# Patient Record
Sex: Male | Born: 1940 | Race: White | Hispanic: No | Marital: Married | Smoking: Former smoker
Health system: Southern US, Community
[De-identification: ages and names within clinical notes are randomized; demographics above are authoritative.]

## PROBLEM LIST (undated history)

## (undated) DIAGNOSIS — R059 Cough, unspecified: Secondary | ICD-10-CM

## (undated) DIAGNOSIS — J189 Pneumonia, unspecified organism: Secondary | ICD-10-CM

## (undated) DIAGNOSIS — F329 Major depressive disorder, single episode, unspecified: Secondary | ICD-10-CM

## (undated) DIAGNOSIS — J449 Chronic obstructive pulmonary disease, unspecified: Secondary | ICD-10-CM

## (undated) DIAGNOSIS — I10 Essential (primary) hypertension: Secondary | ICD-10-CM

## (undated) DIAGNOSIS — I739 Peripheral vascular disease, unspecified: Secondary | ICD-10-CM

## (undated) DIAGNOSIS — F32A Depression, unspecified: Secondary | ICD-10-CM

## (undated) DIAGNOSIS — R0600 Dyspnea, unspecified: Secondary | ICD-10-CM

## (undated) DIAGNOSIS — M1990 Unspecified osteoarthritis, unspecified site: Secondary | ICD-10-CM

## (undated) DIAGNOSIS — F419 Anxiety disorder, unspecified: Secondary | ICD-10-CM

## (undated) DIAGNOSIS — K254 Chronic or unspecified gastric ulcer with hemorrhage: Secondary | ICD-10-CM

## (undated) DIAGNOSIS — R05 Cough: Secondary | ICD-10-CM

## (undated) HISTORY — DX: Peripheral vascular disease, unspecified: I73.9

## (undated) HISTORY — DX: Chronic obstructive pulmonary disease, unspecified: J44.9

## (undated) HISTORY — DX: Unspecified osteoarthritis, unspecified site: M19.90

## (undated) HISTORY — PX: JOINT REPLACEMENT: SHX530

## (undated) HISTORY — PX: KNEE ARTHROSCOPY: SUR90

---
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
## 2005-10-25 ENCOUNTER — Encounter: Admission: RE | Admit: 2005-10-25 | Discharge: 2005-10-25 | Payer: Self-pay | Admitting: Orthopedic Surgery

## 2007-02-08 HISTORY — PX: TOTAL HIP ARTHROPLASTY: SHX124

## 2009-02-21 ENCOUNTER — Emergency Department (HOSPITAL_COMMUNITY): Admission: EM | Admit: 2009-02-21 | Discharge: 2009-02-21 | Payer: Self-pay | Admitting: Emergency Medicine

## 2012-02-08 DIAGNOSIS — I739 Peripheral vascular disease, unspecified: Secondary | ICD-10-CM

## 2012-02-08 HISTORY — DX: Peripheral vascular disease, unspecified: I73.9

## 2012-12-03 DIAGNOSIS — M2240 Chondromalacia patellae, unspecified knee: Secondary | ICD-10-CM | POA: Insufficient documentation

## 2012-12-03 DIAGNOSIS — M1710 Unilateral primary osteoarthritis, unspecified knee: Secondary | ICD-10-CM | POA: Insufficient documentation

## 2012-12-03 DIAGNOSIS — Z96649 Presence of unspecified artificial hip joint: Secondary | ICD-10-CM | POA: Insufficient documentation

## 2012-12-03 DIAGNOSIS — S83249A Other tear of medial meniscus, current injury, unspecified knee, initial encounter: Secondary | ICD-10-CM | POA: Insufficient documentation

## 2014-04-07 DIAGNOSIS — J069 Acute upper respiratory infection, unspecified: Secondary | ICD-10-CM | POA: Diagnosis not present

## 2014-04-07 DIAGNOSIS — R062 Wheezing: Secondary | ICD-10-CM | POA: Diagnosis not present

## 2014-10-31 DIAGNOSIS — J449 Chronic obstructive pulmonary disease, unspecified: Secondary | ICD-10-CM | POA: Diagnosis not present

## 2014-10-31 DIAGNOSIS — F17200 Nicotine dependence, unspecified, uncomplicated: Secondary | ICD-10-CM | POA: Diagnosis not present

## 2015-02-20 DIAGNOSIS — Z6824 Body mass index (BMI) 24.0-24.9, adult: Secondary | ICD-10-CM | POA: Diagnosis not present

## 2015-02-20 DIAGNOSIS — R509 Fever, unspecified: Secondary | ICD-10-CM | POA: Diagnosis not present

## 2015-02-20 DIAGNOSIS — F330 Major depressive disorder, recurrent, mild: Secondary | ICD-10-CM | POA: Diagnosis not present

## 2015-02-20 DIAGNOSIS — Z125 Encounter for screening for malignant neoplasm of prostate: Secondary | ICD-10-CM | POA: Diagnosis not present

## 2015-02-20 DIAGNOSIS — E785 Hyperlipidemia, unspecified: Secondary | ICD-10-CM | POA: Diagnosis not present

## 2015-03-10 DIAGNOSIS — R509 Fever, unspecified: Secondary | ICD-10-CM | POA: Diagnosis not present

## 2015-04-15 ENCOUNTER — Ambulatory Visit (INDEPENDENT_AMBULATORY_CARE_PROVIDER_SITE_OTHER): Payer: Commercial Managed Care - HMO | Admitting: Infectious Diseases

## 2015-04-15 ENCOUNTER — Encounter: Payer: Self-pay | Admitting: Infectious Diseases

## 2015-04-15 VITALS — BP 156/70 | HR 71 | Temp 97.7°F | Ht 70.0 in | Wt 174.0 lb

## 2015-04-15 DIAGNOSIS — R509 Fever, unspecified: Secondary | ICD-10-CM | POA: Diagnosis not present

## 2015-04-15 NOTE — Progress Notes (Signed)
Subjective:    Patient ID: William Kirk, male    DOB: July 14, 1940, 75 y.o.   MRN: [id number]  HPI 75 yo M with FUO since September, up to 101-102. Has not had for last month.  Lasts for ~ 1 day. No cough, mild sob. Has lost 5-6#. No LAN.  Was seen by PCP and had blood work done. Had CXR that was (-) per pt. Was seen at Urgent Care ([facility] once- was rec claritin, mucinex- CXR nl 10-2014).  He had normal CBC, BMP and PSA at [facility] 09-2014.   The past medical history, family history and social history were reviewed/updated in EPIC  TB risk- no known exposure, was in [employer] [id number] (on [employer]). No incarcerations, homelessness.  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   Review of Systems  Constitutional: Positive for fever, fatigue and unexpected weight change. Negative for chills and appetite change.  Respiratory: Positive for shortness of breath. Negative for cough.   Cardiovascular: Negative for leg swelling.  Gastrointestinal: Negative for diarrhea and constipation.  Genitourinary: Negative for difficulty urinating.  Musculoskeletal: Positive for myalgias.       Objective:   Physical Exam  Constitutional: He appears well-developed and well-nourished.  HENT:  Mouth/Throat: No oropharyngeal exudate.  Eyes: EOM are normal. Pupils are equal, round, and reactive to light.  Neck: Neck supple.  Cardiovascular: Normal rate, regular rhythm and normal heart sounds.   Pulmonary/Chest: Effort normal and breath sounds normal.  Abdominal: Soft. Bowel sounds are normal. There is no tenderness. There is no rebound.  Musculoskeletal: He exhibits no edema.  Normal pulse LLE- DP- 2+. notpalpable on R but normal warmth.  No skin ulcers.   Lymphadenopathy:    He has no cervical adenopathy.      Assessment & Plan:

## 2015-04-15 NOTE — Assessment & Plan Note (Addendum)
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  He has no clear si/sx to suggest a source.  He has some TB risk factors but a negative CXR prior, minimal if any cough. None in office.  He does not complain of joint aches.  He has a hx of RLE ischemia, but none clearly present on exam.  Will screen him broadly, he will get his labs done at [place], bring them to his f/u in 1 month If blood work negative, pan-CT to screen for malignancy Also keep fever log.

## 2015-05-13 ENCOUNTER — Ambulatory Visit: Payer: Commercial Managed Care - HMO | Admitting: Infectious Diseases

## 2015-05-30 DIAGNOSIS — R0902 Hypoxemia: Secondary | ICD-10-CM | POA: Diagnosis not present

## 2015-05-30 DIAGNOSIS — J449 Chronic obstructive pulmonary disease, unspecified: Secondary | ICD-10-CM | POA: Diagnosis not present

## 2015-05-30 DIAGNOSIS — R05 Cough: Secondary | ICD-10-CM | POA: Diagnosis not present

## 2015-05-30 DIAGNOSIS — R509 Fever, unspecified: Secondary | ICD-10-CM | POA: Diagnosis not present

## 2015-09-03 DIAGNOSIS — H5210 Myopia, unspecified eye: Secondary | ICD-10-CM | POA: Diagnosis not present

## 2015-09-03 DIAGNOSIS — H524 Presbyopia: Secondary | ICD-10-CM | POA: Diagnosis not present

## 2016-06-20 ENCOUNTER — Emergency Department (HOSPITAL_BASED_OUTPATIENT_CLINIC_OR_DEPARTMENT_OTHER): Payer: Non-veteran care

## 2016-06-20 ENCOUNTER — Encounter (HOSPITAL_BASED_OUTPATIENT_CLINIC_OR_DEPARTMENT_OTHER): Payer: Self-pay

## 2016-06-20 ENCOUNTER — Inpatient Hospital Stay (HOSPITAL_BASED_OUTPATIENT_CLINIC_OR_DEPARTMENT_OTHER)
Admission: EM | Admit: 2016-06-20 | Discharge: 2016-06-23 | DRG: 871 | Disposition: A | Discharge status: Home / Self Care | Payer: Non-veteran care | Attending: Internal Medicine | Admitting: Internal Medicine

## 2016-06-20 DIAGNOSIS — E876 Hypokalemia: Secondary | ICD-10-CM

## 2016-06-20 DIAGNOSIS — J449 Chronic obstructive pulmonary disease, unspecified: Secondary | ICD-10-CM | POA: Diagnosis not present

## 2016-06-20 DIAGNOSIS — J470 Bronchiectasis with acute lower respiratory infection: Secondary | ICD-10-CM | POA: Diagnosis present

## 2016-06-20 DIAGNOSIS — E440 Moderate protein-calorie malnutrition: Secondary | ICD-10-CM | POA: Diagnosis present

## 2016-06-20 DIAGNOSIS — J471 Bronchiectasis with (acute) exacerbation: Secondary | ICD-10-CM | POA: Diagnosis not present

## 2016-06-20 DIAGNOSIS — Z7982 Long term (current) use of aspirin: Secondary | ICD-10-CM | POA: Diagnosis not present

## 2016-06-20 DIAGNOSIS — Z79891 Long term (current) use of opiate analgesic: Secondary | ICD-10-CM | POA: Diagnosis not present

## 2016-06-20 DIAGNOSIS — E871 Hypo-osmolality and hyponatremia: Secondary | ICD-10-CM | POA: Diagnosis present

## 2016-06-20 DIAGNOSIS — E43 Unspecified severe protein-calorie malnutrition: Secondary | ICD-10-CM | POA: Insufficient documentation

## 2016-06-20 DIAGNOSIS — J479 Bronchiectasis, uncomplicated: Secondary | ICD-10-CM

## 2016-06-20 DIAGNOSIS — A419 Sepsis, unspecified organism: Secondary | ICD-10-CM | POA: Diagnosis not present

## 2016-06-20 DIAGNOSIS — M1990 Unspecified osteoarthritis, unspecified site: Secondary | ICD-10-CM | POA: Diagnosis present

## 2016-06-20 DIAGNOSIS — J440 Chronic obstructive pulmonary disease with acute lower respiratory infection: Secondary | ICD-10-CM | POA: Diagnosis present

## 2016-06-20 DIAGNOSIS — Z79899 Other long term (current) drug therapy: Secondary | ICD-10-CM

## 2016-06-20 DIAGNOSIS — Z87891 Personal history of nicotine dependence: Secondary | ICD-10-CM

## 2016-06-20 DIAGNOSIS — Z6822 Body mass index (BMI) 22.0-22.9, adult: Secondary | ICD-10-CM | POA: Diagnosis not present

## 2016-06-20 DIAGNOSIS — I739 Peripheral vascular disease, unspecified: Secondary | ICD-10-CM | POA: Diagnosis not present

## 2016-06-20 DIAGNOSIS — J9601 Acute respiratory failure with hypoxia: Secondary | ICD-10-CM | POA: Diagnosis present

## 2016-06-20 DIAGNOSIS — D75839 Thrombocytosis, unspecified: Secondary | ICD-10-CM

## 2016-06-20 DIAGNOSIS — J189 Pneumonia, unspecified organism: Secondary | ICD-10-CM | POA: Diagnosis present

## 2016-06-20 DIAGNOSIS — J180 Bronchopneumonia, unspecified organism: Secondary | ICD-10-CM | POA: Diagnosis not present

## 2016-06-20 DIAGNOSIS — D473 Essential (hemorrhagic) thrombocythemia: Secondary | ICD-10-CM | POA: Diagnosis present

## 2016-06-20 DIAGNOSIS — J90 Pleural effusion, not elsewhere classified: Secondary | ICD-10-CM | POA: Diagnosis not present

## 2016-06-20 DIAGNOSIS — J441 Chronic obstructive pulmonary disease with (acute) exacerbation: Secondary | ICD-10-CM | POA: Diagnosis present

## 2016-06-20 LAB — CBC WITH DIFFERENTIAL/PLATELET
Basophils Absolute: 0 10*3/uL (ref 0.0–0.1)
Basophils Relative: 0 %
Eosinophils Absolute: 0.1 10*3/uL (ref 0.0–0.7)
Eosinophils Relative: 1 %
HCT: 30.6 % — ABNORMAL LOW (ref 39.0–52.0)
Hemoglobin: 10.2 g/dL — ABNORMAL LOW (ref 13.0–17.0)
Lymphocytes Relative: 9 %
Lymphs Abs: 1.8 10*3/uL (ref 0.7–4.0)
MCH: 27.6 pg (ref 26.0–34.0)
MCHC: 33.3 g/dL (ref 30.0–36.0)
MCV: 82.7 fL (ref 78.0–100.0)
Monocytes Absolute: 1.1 10*3/uL — ABNORMAL HIGH (ref 0.1–1.0)
Monocytes Relative: 6 %
Neutro Abs: 16.9 10*3/uL — ABNORMAL HIGH (ref 1.7–7.7)
Neutrophils Relative %: 85 %
Platelets: 535 10*3/uL — ABNORMAL HIGH (ref 150–400)
RBC: 3.7 MIL/uL — ABNORMAL LOW (ref 4.22–5.81)
RDW: 14.6 % (ref 11.5–15.5)
WBC: 19.9 10*3/uL — ABNORMAL HIGH (ref 4.0–10.5)

## 2016-06-20 LAB — BASIC METABOLIC PANEL
Anion gap: 10 (ref 5–15)
BUN: 21 mg/dL — ABNORMAL HIGH (ref 6–20)
CO2: 27 mmol/L (ref 22–32)
Calcium: 8.1 mg/dL — ABNORMAL LOW (ref 8.9–10.3)
Chloride: 95 mmol/L — ABNORMAL LOW (ref 101–111)
Creatinine, Ser: 0.81 mg/dL (ref 0.61–1.24)
GFR calc Af Amer: >60 mL/min (ref >60)
GFR calc non Af Amer: >60 mL/min (ref >60)
Glucose, Bld: 105 mg/dL — ABNORMAL HIGH (ref 65–99)
Potassium: 2.6 mmol/L — CL (ref 3.5–5.1)
Sodium: 132 mmol/L — ABNORMAL LOW (ref 135–145)

## 2016-06-20 LAB — TROPONIN I: Troponin I: <0.03 ng/mL (ref <0.03)

## 2016-06-20 LAB — BRAIN NATRIURETIC PEPTIDE: B Natriuretic Peptide: 187.3 pg/mL — ABNORMAL HIGH (ref 0.0–100.0)

## 2016-06-20 LAB — MRSA PCR SCREENING: MRSA by PCR: NEGATIVE

## 2016-06-20 MED ORDER — DEXTROSE 5 % IV SOLN: 1.0000 g | Freq: Three times a day (TID) | INTRAVENOUS | Status: DC

## 2016-06-20 MED ORDER — ALBUTEROL SULFATE (2.5 MG/3ML) 0.083% IN NEBU: 2.5000 mg | INHALATION_SOLUTION | RESPIRATORY_TRACT | Status: DC | PRN

## 2016-06-20 MED ORDER — POTASSIUM CHLORIDE CRYS ER 20 MEQ PO TBCR
40.0000 meq | EXTENDED_RELEASE_TABLET | Freq: Once | ORAL | Status: AC
  Administered 2016-06-20: 40 meq via ORAL
  Filled 2016-06-20: qty 2

## 2016-06-20 MED ORDER — ENSURE ENLIVE PO LIQD
237.0000 mL | Freq: Two times a day (BID) | ORAL | Status: DC
  Administered 2016-06-21 – 2016-06-22 (×2): 237 mL via ORAL

## 2016-06-20 MED ORDER — SODIUM CHLORIDE 0.9 % IV SOLN
INTRAVENOUS | Status: DC
  Administered 2016-06-20 – 2016-06-21 (×2): via INTRAVENOUS

## 2016-06-20 MED ORDER — SERTRALINE HCL 100 MG PO TABS
200.0000 mg | ORAL_TABLET | Freq: Every day | ORAL | Status: DC
  Administered 2016-06-21 – 2016-06-23 (×3): 200 mg via ORAL
  Filled 2016-06-20 (×3): qty 2

## 2016-06-20 MED ORDER — ENOXAPARIN SODIUM 40 MG/0.4ML ~~LOC~~ SOLN
40.0000 mg | SUBCUTANEOUS | Status: DC
  Administered 2016-06-20 – 2016-06-22 (×3): 40 mg via SUBCUTANEOUS
  Filled 2016-06-20 (×3): qty 0.4

## 2016-06-20 MED ORDER — TRAZODONE HCL 50 MG PO TABS
50.0000 mg | ORAL_TABLET | Freq: Every day | ORAL | Status: DC
  Administered 2016-06-20 – 2016-06-22 (×3): 50 mg via ORAL
  Filled 2016-06-20 (×3): qty 1

## 2016-06-20 MED ORDER — ASPIRIN 81 MG PO CHEW
81.0000 mg | CHEWABLE_TABLET | Freq: Every day | ORAL | Status: DC
  Administered 2016-06-20 – 2016-06-23 (×4): 81 mg via ORAL
  Filled 2016-06-20 (×4): qty 1

## 2016-06-20 MED ORDER — BUDESONIDE-FORMOTEROL FUMARATE 160-4.5 MCG/ACT IN AERO
2.0000 | INHALATION_SPRAY | Freq: Two times a day (BID) | RESPIRATORY_TRACT | Status: DC
  Administered 2016-06-20 – 2016-06-23 (×6): 2 via RESPIRATORY_TRACT
  Filled 2016-06-20: qty 6

## 2016-06-20 MED ORDER — PIPERACILLIN-TAZOBACTAM 3.375 G IVPB 30 MIN
3.3750 g | Freq: Four times a day (QID) | INTRAVENOUS | Status: DC
  Administered 2016-06-20: 3.375 g via INTRAVENOUS
  Filled 2016-06-20 (×4): qty 50

## 2016-06-20 MED ORDER — VITAMIN B-12 1000 MCG PO TABS
1000.0000 ug | ORAL_TABLET | Freq: Every day | ORAL | Status: DC
  Administered 2016-06-21 – 2016-06-23 (×3): 1000 ug via ORAL
  Filled 2016-06-20 (×3): qty 1

## 2016-06-20 MED ORDER — PIPERACILLIN-TAZOBACTAM 3.375 G IVPB
3.3750 g | Freq: Three times a day (TID) | INTRAVENOUS | Status: DC
  Administered 2016-06-20 – 2016-06-23 (×8): 3.375 g via INTRAVENOUS
  Filled 2016-06-20 (×10): qty 50

## 2016-06-20 MED ORDER — POTASSIUM CHLORIDE 10 MEQ/100ML IV SOLN
10.0000 meq | INTRAVENOUS | Status: AC
  Administered 2016-06-20 (×2): 10 meq via INTRAVENOUS
  Filled 2016-06-20 (×2): qty 100

## 2016-06-20 MED ORDER — VANCOMYCIN HCL IN DEXTROSE 1-5 GM/200ML-% IV SOLN
1000.0000 mg | Freq: Once | INTRAVENOUS | Status: AC
  Administered 2016-06-20: 1000 mg via INTRAVENOUS
  Filled 2016-06-20: qty 200

## 2016-06-20 MED ORDER — CILOSTAZOL 100 MG PO TABS
100.0000 mg | ORAL_TABLET | Freq: Two times a day (BID) | ORAL | Status: DC
  Administered 2016-06-20 – 2016-06-23 (×6): 100 mg via ORAL
  Filled 2016-06-20 (×7): qty 1

## 2016-06-20 MED ORDER — VANCOMYCIN HCL IN DEXTROSE 750-5 MG/150ML-% IV SOLN
750.0000 mg | Freq: Three times a day (TID) | INTRAVENOUS | Status: DC
  Administered 2016-06-21 – 2016-06-23 (×8): 750 mg via INTRAVENOUS
  Filled 2016-06-20 (×9): qty 150

## 2016-06-20 MED ORDER — GABAPENTIN 300 MG PO CAPS
300.0000 mg | ORAL_CAPSULE | Freq: Every day | ORAL | Status: DC
  Administered 2016-06-20 – 2016-06-22 (×3): 300 mg via ORAL
  Filled 2016-06-20 (×3): qty 1

## 2016-06-20 MED ORDER — HYDROCODONE-ACETAMINOPHEN 10-325 MG PO TABS: 1.0000 | ORAL_TABLET | Freq: Three times a day (TID) | ORAL | Status: DC | PRN

## 2016-06-20 MED ORDER — TIOTROPIUM BROMIDE MONOHYDRATE 18 MCG IN CAPS: 18.0000 ug | ORAL_CAPSULE | Freq: Two times a day (BID) | RESPIRATORY_TRACT | Status: DC

## 2016-06-20 MED ORDER — POTASSIUM CHLORIDE 10 MEQ/100ML IV SOLN
10.0000 meq | Freq: Once | INTRAVENOUS | Status: AC
  Administered 2016-06-20: 10 meq via INTRAVENOUS
  Filled 2016-06-20: qty 100

## 2016-06-20 MED ORDER — TIOTROPIUM BROMIDE MONOHYDRATE 18 MCG IN CAPS
18.0000 ug | ORAL_CAPSULE | Freq: Every day | RESPIRATORY_TRACT | Status: DC
  Filled 2016-06-20: qty 5

## 2016-06-20 MED ORDER — PIPERACILLIN-TAZOBACTAM 4.5 G IVPB
4.5000 g | Freq: Once | INTRAVENOUS | Status: DC
  Filled 2016-06-20: qty 100

## 2016-06-20 NOTE — ED Notes (Signed)
Lab phones this rn with critical k+ of 2.6. [place], [place] and [place], [place] notified.

## 2016-06-20 NOTE — ED Notes (Signed)
ED Provider at bedside. 

## 2016-06-20 NOTE — ED Notes (Signed)
William Kirk w/ [facility] stated that it'll be after 7pm before a truck will arrive for transport.

## 2016-06-20 NOTE — ED Triage Notes (Signed)
Pt being treated x 1 week for PNE-seen at [place] leaving was advised to come to ED due to worsening CXR and abnormal labs

## 2016-06-20 NOTE — ED Provider Notes (Signed)
[facility] DEPT [facility] Provider Note   CSN: [id number] Arrival date & time: 06/20/16  1402     History   Chief Complaint Chief Complaint  Patient presents with  . Pneumonia    HPI MIKAEL SKODA is a 76 y.o. male with history of COPD not on home O2 and PVD presents today for evaluation of sudden onset, progressively worsening pneumonia and shortness of breath. He is a patient of the [place], who states that several weeks ago he developed fever and shortness of breath. He also has an associated productive cough of gray sputum and right sided chest pain with cough only. Initially evaluated and found to have a right-sided pneumonia and was treated with Cipro for 6 days. He subsequently developed fever, was reevaluated and treated with Levaquin for 6 days only to develop fever once more. He was evaluated 7 days ago and found to have a pneumonia in his right upper lobe and right middle lobe on chest x-ray for which she has been taking Cipro for the past 7 days. He presented for reevaluation earlier today and seen by Dr. Baird Cancer, an infectious disease doctor at the [place]. Repeat chest x-ray showed worsening pneumonia with extension to the left base, as well as leukocytosis of 21K and K 2.6. He is afebrile currently, but states he was febrile at 100F last night. He has been taking Tylenol for his fever and Mucinex for his cough which have been helpful.   Denies hemoptysis abd pain, n/v/d, dizziness, syncope.     The history is provided by the patient and the spouse.    Past Medical History:  Diagnosis Date  . Arthritis   . COPD (chronic obstructive pulmonary disease) ([place])   . PVD (peripheral vascular disease) ([place]) 2014   prev PTCA on RLE    Patient Active Problem List   Diagnosis Date Noted  . PNA (pneumonia) 06/20/2016  . FUO (fever of unknown origin) 04/15/2015  . 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 H/O total hip arthroplasty  12/03/2012  . Other tear of medial meniscus, current injury, unspecified knee, initial encounter 12/03/2012    Past Surgical History:  Procedure Laterality Date  . HIP SURGERY Right 2009  . knee aeroscopic Right 2012  . KNEE ARTHROSCOPY Right 2010       Home Medications    Prior to Admission medications   Medication Sig Start Date End Date Taking? Authorizing Provider  Budesonide-Formoterol Fumarate (SYMBICORT IN) Inhale into the lungs.   Yes [provider]  ciprofloxacin (CIPRO) 500 MG tablet Take 500 mg by mouth 2 (two) times daily.   Yes [provider]  tiotropium (SPIRIVA) 18 MCG inhalation capsule Place 18 mcg into inhaler and inhale daily.   Yes [provider]  traZODone (DESYREL) 50 MG tablet Take 50 mg by mouth at bedtime.   Yes [provider]  albuterol (PROVENTIL HFA;VENTOLIN HFA) 108 (90 Base) MCG/ACT inhaler Inhale 1-2 puffs into the lungs every 6 (six) hours as needed for wheezing or shortness of breath.    [provider]  aspirin 81 MG tablet Take 81 mg by mouth daily.    [provider]  cilostazol (PLETAL) 100 MG tablet Take 100 mg by mouth 2 (two) times daily.     [provider]  gabapentin (NEURONTIN) 300 MG capsule Take 300 mg by mouth at bedtime.     [provider]  HYDROcodone-acetaminophen (NORCO) 10-325 MG tablet Take 1-2 tablets by  mouth every 8 (eight) hours as needed.    [provider]  Multiple Vitamins-Minerals (MULTIVITAMIN WITH MINERALS) tablet Take 1 tablet by mouth daily.    [provider]  sertraline (ZOLOFT) 100 MG tablet Take 200 mg by mouth daily.     [provider]  testosterone cypionate (DEPOTESTOTERONE CYPIONATE) 100 MG/ML injection Inject 200 mg into the muscle every 28 (twenty-eight) days.     [provider]  Vit C-Cholecalciferol-Rose Hip (VITAMIN C & D3/ROSE HIPS PO) Take 1 capsule by mouth daily.    [provider]    vitamin B-12 (CYANOCOBALAMIN) 1000 MCG tablet Take 1,000 mcg by mouth daily.    [provider]    Family History Family History  Problem Relation Age of Onset  . Cancer Father 70       Brain  . Dementia Mother     Social History Social History  Substance Use Topics  . Smoking status: Former Smoker    Packs/day: 0.50    Types: Cigarettes  . Smokeless tobacco: Never Used     Comment: 10 a day at most  . Alcohol use No     Allergies   Patient has no known allergies.   Review of Systems Review of Systems  Constitutional: Positive for fever. Negative for chills.  HENT: Negative for congestion.   Respiratory: Positive for cough and shortness of breath.   Cardiovascular: Positive for chest pain.  Gastrointestinal: Negative for abdominal pain, diarrhea, nausea and vomiting.  Neurological: Positive for headaches. Negative for seizures.     Physical Exam Updated Vital Signs BP (!) 127/59   Pulse 71   Temp 98.4 F (36.9 C) (Oral)   Resp (!) 31   Ht 5\' 11"  (1.803 m)   Wt 74.4 kg   SpO2 92%   BMI 22.87 kg/m   Physical Exam  Constitutional: He is oriented to person, place, and time. He appears well-developed and well-nourished. No distress.  HENT:  Head: Normocephalic and atraumatic.  Eyes: Right eye exhibits no discharge. Left eye exhibits no discharge. No scleral icterus.  Neck: No JVD present. No tracheal deviation present.  Cardiovascular: Normal rate, regular rhythm and intact distal pulses.   Distant heart sounds, 2+ radial pulses bl  Pulmonary/Chest: He has rales. He exhibits no tenderness.  Diffuse right sided crackles and left basilar crackles, on SpO2 95% on 2L [place]  Abdominal: Soft. Bowel sounds are normal. He exhibits no distension. There is no tenderness.  Musculoskeletal: He exhibits no edema.  Neurological: He is alert and oriented to person, place, and time.  Skin: Skin is dry. Capillary refill takes less than 2 seconds. He is not  diaphoretic.  Psychiatric: He has a normal mood and affect. His behavior is normal.     ED Treatments / Results  Labs (all labs ordered are listed, but only abnormal results are displayed) Labs Reviewed  BASIC METABOLIC PANEL - Abnormal; Notable for the following:       Result Value   Sodium 132 (*)    Potassium 2.6 (*)    Chloride 95 (*)    Glucose, Bld 105 (*)    BUN 21 (*)    Calcium 8.1 (*)    All other components within normal limits  CBC WITH DIFFERENTIAL/PLATELET - Abnormal; Notable for the following:    WBC 19.9 (*)    RBC 3.70 (*)    Hemoglobin 10.2 (*)    HCT 30.6 (*)    Platelets 535 (*)  Neutro Abs 16.9 (*)    Monocytes Absolute 1.1 (*)    All other components within normal limits  BRAIN NATRIURETIC PEPTIDE - Abnormal; Notable for the following:    B Natriuretic Peptide 187.3 (*)    All other components within normal limits  TROPONIN I    EKG  EKG Interpretation  Date/Time:  Monday Jun 20 2016 14:09:29 EDT Ventricular Rate:  80 PR Interval:  90 QRS Duration: 106 QT Interval:  442 QTC Calculation: 509 R Axis:   76 Text Interpretation:  Sinus rhythm with short PR with Premature supraventricular complexes Nonspecific ST abnormality Prolonged QT Abnormal ECG Confirmed by DELO  MD, DOUGLAS ([id number]) on 06/20/2016 2:12:45 PM       Radiology Dg Chest 2 View  Result Date: 06/20/2016 CLINICAL DATA:  Diagnosed with pneumonia Jun 13, 2016. Shortness of breath. History of COPD. EXAM: CHEST  2 VIEW COMPARISON:  Chest radiograph February 21, 2009 FINDINGS: Interstitial and alveolar airspace opacities throughout the RIGHT greater than LEFT lung with nodular appearance. No pleural effusion. Cardiomediastinal silhouette is normal, mildly calcified aortic knob. Increased lung volumes with flattened hemidiaphragms. Patient rotated to the RIGHT. No pneumothorax. Soft tissue planes and included osseous structures are nonsuspicious. IMPRESSION: Interstitial and alveolar airspace  opacities compatible multifocal pneumonia, metastatic disease less likely. Followup PA and lateral chest X-ray is recommended in 3-4 weeks following trial of antibiotic therapy to ensure resolution and exclude underlying malignancy. COPD. Electronically Signed   By: Elon Alas M.D.   On: 06/20/2016 15:00    Procedures Procedures (including critical care time)  Medications Ordered in ED Medications  vancomycin (VANCOCIN) IVPB 1000 mg/200 mL premix (not administered)  potassium chloride 10 mEq in 100 mL IVPB (not administered)  piperacillin-tazobactam (ZOSYN) IVPB 3.375 g (3.375 g Intravenous New Bag/Given 06/20/16 1634)  potassium chloride 10 mEq in 100 mL IVPB (10 mEq Intravenous New Bag/Given 06/20/16 1533)  potassium chloride SA (K-DUR,KLOR-CON) CR tablet 40 mEq (40 mEq Oral Given 06/20/16 1527)     Initial Impression / Assessment and Plan / ED Course  I have reviewed the triage vital signs and the nursing notes.  Pertinent labs & imaging results that were available during my care of the patient were reviewed by me and considered in my medical decision making (see chart for details).     Patient with progressively worsening multi lobar pneumonia. Afebrile, SpO2 90% on 2LPM [place]. BNP moderately elevated; negative troponin and EKG with non-specific T wave abnormality and short PR. Low suspicion ACS/MO. Leukocytosis of 19K and K 2.6 here; initiated IV and PO replenishment of potassium as well as broad-spectrum antibiotics. Hospitalist consulted, and will assume care and bring patient into hospital for further evaluation and management.   Final Clinical Impressions(s) / ED Diagnoses   Final diagnoses:  Recurrent pneumonia    New Prescriptions New Prescriptions   No medications on file     Debroah Baller 06/20/16 1640    Veryl Speak, MD 06/27/16 [id number]

## 2016-06-20 NOTE — H&P (Signed)
History and Physical    [name] NWG:[id number] DOB: May 04, 1940 DOA: 06/20/2016  [facility]: [facility] Consultants:  Allyne Gee - ID; [facility] - Pulmonology Patient coming from: home - lives with wife; NOK: wife, [phone]  Chief Complaint:  pneumonia  HPI: William Kirk is a 76 y.o. male with medical history significant of COPD not on home O2 and PVD presenting with persistent respiratory issues.  He reports that he has been on "antibiotics for almost a year, trying to get rid of it".  Dr. Shelle Iron said "you got little boogers in your lung".  Recurrent PNA.  Current symptoms since last Monday, a week now.  Fever daily between 3-4 pm and goes to 101.2.  +cough - productive of light gray sputum.  No SOB.  Most recent antibiotic was Cipro - started by Dr. Shelle Iron last Monday - 1 1/2 tabs PO BID.  Had an appt at the [facility] today - Dr. Allyne Gee and Dr. Shelle Iron told him to go check in at the hospital.  [facility] in [place] didn't have a bed so he went to [facility] instead.   ED Course: Progressively worsening multilobar PNA.  2L O2, 90%.  K 2.6 - given IV and PO potassium.  Vanc/Zosyn.  Review of Systems: As per HPI; otherwise review of systems reviewed and negative.   Ambulatory Status:  Ambulates without assistance  Past Medical History:  Diagnosis Date  . Arthritis   . COPD (chronic obstructive pulmonary disease) ([facility])   . PVD (peripheral vascular disease) ([facility]) 2014   prev PTCA on RLE    Past Surgical History:  Procedure Laterality Date  . HIP SURGERY Right 2009  . knee aeroscopic Right 2012  . KNEE ARTHROSCOPY Right 2010    Social History   Social History  . Marital status: Married    Spouse name: N/A  . Number of children: N/A  . Years of education: 54   Occupational History  . law enforcement    Social History Main Topics  . Smoking status: Former Smoker    Packs/day: 1.00    Years: 50.00    Types: Cigarettes    Quit date: 2017  . Smokeless tobacco: Never Used     Comment: 10 a  day at most  . Alcohol use No  . Drug use: No  . Sexual activity: Not on file   Other Topics Concern  . Not on file   Social History Narrative  . No narrative on file    No Known Allergies  Family History  Problem Relation Age of Onset  . Cancer Father 38       Brain  . Dementia Mother     Prior to Admission medications   Medication Sig Start Date End Date Taking? Authorizing Provider  Budesonide-Formoterol Fumarate (SYMBICORT IN) Inhale into the lungs.   Yes [provider]  ciprofloxacin (CIPRO) 500 MG tablet Take 500 mg by mouth 2 (two) times daily.   Yes [provider]  tiotropium (SPIRIVA) 18 MCG inhalation capsule Place 18 mcg into inhaler and inhale daily.   Yes [provider]  traZODone (DESYREL) 50 MG tablet Take 50 mg by mouth at bedtime.   Yes [provider]  albuterol (PROVENTIL HFA;VENTOLIN HFA) 108 (90 Base) MCG/ACT inhaler Inhale 1-2 puffs into the lungs every 6 (six) hours as needed for wheezing or shortness of breath.    [provider]  aspirin 81 MG tablet Take 81 mg by mouth daily.    [provider]  cilostazol (PLETAL) 100 MG tablet Take 100 mg by mouth 2 (two) times daily.     [provider]  gabapentin (NEURONTIN) 300 MG capsule Take 300 mg by mouth at bedtime.     [provider]  HYDROcodone-acetaminophen (NORCO) 10-325 MG tablet Take 1-2 tablets by mouth every 8 (eight) hours as needed.    [provider]  Multiple Vitamins-Minerals (MULTIVITAMIN WITH MINERALS) tablet Take 1 tablet by mouth daily.    [provider]  sertraline (ZOLOFT) 100 MG tablet Take 200 mg by mouth daily.     [provider]  testosterone cypionate (DEPOTESTOTERONE CYPIONATE) 100 MG/ML injection Inject 200 mg into the muscle every 28 (twenty-eight) days.     [provider]  Vit C-Cholecalciferol-Rose Hip (VITAMIN C & D3/ROSE HIPS PO) Take 1 capsule by mouth daily.     [provider]  vitamin B-12 (CYANOCOBALAMIN) 1000 MCG tablet Take 1,000 mcg by mouth daily.    [provider]    Physical Exam: Vitals:   06/20/16 2100 06/20/16 2200 06/20/16 2347 06/21/16 0000  BP: (!) 127/47 (!) 143/58  (!) 102/39  Pulse:      Resp: 15 (!) 25  13  Temp:   98.6 F (37 C)   TempSrc:   Oral   SpO2: 93% (!) 89%  92%  Weight:      Height:         General: Appears calm and comfortable and is NAD Eyes:  PERRL, EOMI, normal lids, iris ENT:  grossly normal hearing, lips & tongue, mmm Neck:  no LAD, masses or thyromegaly Cardiovascular:  RRR, no m/r/g. No LE edema.  Respiratory:  Diffuse rhonchi throughout the lung fields. Normal respiratory effort. Abdomen:  soft, ntnd, NABS Skin:  no rash or induration seen on limited exam Musculoskeletal:  grossly normal tone BUE/BLE, good ROM, no bony abnormality Psychiatric:  grossly normal mood and affect, speech fluent and appropriate, AOx3 Neurologic:  CN 2-12 grossly intact, moves all extremities in coordinated fashion, sensation intact  Labs on Admission: I have personally reviewed following labs and imaging studies  CBC:  Recent Labs Lab 06/20/16 1430  WBC 19.9*  NEUTROABS 16.9*  HGB 10.2*  HCT 30.6*  MCV 82.7  PLT 535*   Basic Metabolic Panel:  Recent Labs Lab 06/20/16 1430  NA 132*  K 2.6*  CL 95*  CO2 27  GLUCOSE 105*  BUN 21*  CREATININE 0.81  CALCIUM 8.1*   GFR: Estimated Creatinine Clearance: 85.5 mL/min (by C-G formula based on SCr of 0.81 mg/dL). Liver Function Tests: No results for input(s): AST, ALT, ALKPHOS, BILITOT, PROT, ALBUMIN in the last 168 hours. No results for input(s): LIPASE, AMYLASE in the last 168 hours. No results for input(s): AMMONIA in the last 168 hours. Coagulation Profile: No results for input(s): INR, PROTIME in the last 168 hours. Cardiac Enzymes:  Recent Labs Lab 06/20/16 1430  TROPONINI <0.03   BNP (last 3 results) No results for  input(s): PROBNP in the last 8760 hours. HbA1C: No results for input(s): HGBA1C in the last 72 hours. CBG: No results for input(s): GLUCAP in the last 168 hours. Lipid Profile: No results for input(s): CHOL, HDL, LDLCALC, TRIG, CHOLHDL, LDLDIRECT in the last 72 hours. Thyroid Function Tests: No results for input(s): TSH, T4TOTAL, FREET4, T3FREE, THYROIDAB in the last 72 hours. Anemia Panel: No results for input(s): VITAMINB12, FOLATE, FERRITIN, TIBC, IRON, RETICCTPCT in the last 72 hours. Urine analysis: No results found  for: COLORURINE, APPEARANCEUR, LABSPEC, PHURINE, GLUCOSEU, HGBUR, BILIRUBINUR, KETONESUR, PROTEINUR, UROBILINOGEN, NITRITE, LEUKOCYTESUR  Creatinine Clearance: Estimated Creatinine Clearance: 85.5 mL/min (by C-G formula based on SCr of 0.81 mg/dL).  Sepsis Labs: @LABRCNTIP (procalcitonin:4,lacticidven:4) ) Recent Results (from the past 240 hour(s))  MRSA PCR Screening     Status: None   Collection Time: 06/20/16  8:22 PM  Result Value Ref Range Status   MRSA by PCR NEGATIVE NEGATIVE Final    Comment:        The GeneXpert MRSA Assay (FDA approved for NASAL specimens only), is one component of a comprehensive MRSA colonization surveillance program. It is not intended to diagnose MRSA infection nor to guide or monitor treatment for MRSA infections.   Culture, sputum-assessment     Status: None   Collection Time: 06/20/16 11:10 PM  Result Value Ref Range Status   Specimen Description SPUTUM  Final   Special Requests Normal  Final   Sputum evaluation THIS SPECIMEN IS ACCEPTABLE FOR SPUTUM CULTURE  Final   Report Status 06/21/2016 FINAL  Final     Radiological Exams on Admission: Dg Chest 2 View  Result Date: 06/20/2016 CLINICAL DATA:  Diagnosed with pneumonia Jun 13, 2016. Shortness of breath. History of COPD. EXAM: CHEST  2 VIEW COMPARISON:  Chest radiograph February 21, 2009 FINDINGS: Interstitial and alveolar airspace opacities throughout the RIGHT greater  than LEFT lung with nodular appearance. No pleural effusion. Cardiomediastinal silhouette is normal, mildly calcified aortic knob. Increased lung volumes with flattened hemidiaphragms. Patient rotated to the RIGHT. No pneumothorax. Soft tissue planes and included osseous structures are nonsuspicious. IMPRESSION: Interstitial and alveolar airspace opacities compatible multifocal pneumonia, metastatic disease less likely. Followup PA and lateral chest X-ray is recommended in 3-4 weeks following trial of antibiotic therapy to ensure resolution and exclude underlying malignancy. COPD. Electronically Signed   By: Awilda Metro M.D.   On: 06/20/2016 15:00    EKG: Independently reviewed.  NSR with rate 73; nonspecific ST changes with no evidence of acute ischemia, prolonged QT (574)  Assessment/Plan Principal Problem:   PNA (pneumonia) Active Problems:   COPD (chronic obstructive pulmonary disease) ([facility])   Pertinent labs: MRSA negative Na 132 K 2.6 Glucose 105 BNP 187.3 WBC 19.9 Hgb 10.2  -Patient with known h/o COPD and progressive recurrent PNA by his report presenting with progressive multifocal PNA -He does have a new O2 requirement -CURB-65 score is 2, meaning that the patient has an estimated mortality risk of 9.2%. -He was accepted for inpatient admission to the [facility] in transfer from [facility] -Will start Vanc/Zosyn due to multiple prior treatments -NS @ 75cc/hr -Fever control -Repeat CBC in am -Sputum cultures -Blood cultures -Strep pneumo testing -Will order ICU procalcitonin levels.  >0.5 indicates infection and >>0.5 indicates more serious disease.  As the procalcitonin level normalizes, it will be reasonable to consider de-escalation of antibiotic coverage. -albuterol PRN -Likely needs pulmonary consult in AM and possible bronchoscopy -[facility] records [facility] -Continue home meds as per med rec   DVT prophylaxis: Lovenox  Code Status: Full - confirmed with patient Family  Communication: None present Disposition Plan:  Home once clinically improved Consults called: Suggest pulmonary in the AM  Admission status: Admit - It is my clinical opinion that admission to INPATIENT is reasonable and necessary because this patient will require at least 2 midnights in the hospital to treat this condition based on the medical complexity of the problems presented.  Given the aforementioned information, the predictability of an adverse outcome is felt  to be significant.     Jonah Blue MD [facility]  If 7PM-7AM, please contact night-coverage [email or web address] Password [email or web address]  06/21/2016, 1:17 AM

## 2016-06-20 NOTE — Progress Notes (Signed)
Presents with PNA, fail out patient treatment. Accepted to step down unit. He will received IV kcl for hypokalemia.

## 2016-06-20 NOTE — Progress Notes (Signed)
Pharmacy Antibiotic Note  William Kirk is a 76 y.o. male admitted on 06/20/2016 with pneumonia.  PMH significant for COPD and presents with worsening PNA and shortness of breath.  Pharmacy has been consulted for Vancomycin dosing.  Patient also on Zosyn.  Patient has received Vancomycin 1gm x 1 dose.  Plan:  Vancomycin 750mg  IV q8h  Zosyn 3.375gm IV q8h (each dose infused over 4 hrs)  Daily SCr  Follow cultures  Height: 5\' 11"  (180.3 cm) Weight: 164 lb (74.4 kg) IBW/kg (Calculated) : 75.3  Temp (24hrs), Avg:98.9 F (37.2 C), Min:98.4 F (36.9 C), Max:99.4 F (37.4 C)   Recent Labs Lab 06/20/16 1430  WBC 19.9*  CREATININE 0.81    Estimated Creatinine Clearance: 85.5 mL/min (by C-G formula based on SCr of 0.81 mg/dL).   06/20/16 CrCl (n) = 82 ml/min  No Known Allergies  Antimicrobials this admission: 5/14 vanc >>   5/14 zosyn >>    Dose adjustments this admission:    Microbiology results: 5/14 BCx: sent 5/14 Sputum: sent  5/14 MRSA PCR: sent  Thank you for allowing pharmacy to be a part of this patient's care.  Everette Rank, PharmD 06/20/2016 9:51 PM

## 2016-06-21 ENCOUNTER — Encounter (HOSPITAL_COMMUNITY): Payer: Self-pay | Admitting: Radiology

## 2016-06-21 ENCOUNTER — Inpatient Hospital Stay (HOSPITAL_COMMUNITY): Payer: Non-veteran care

## 2016-06-21 DIAGNOSIS — E871 Hypo-osmolality and hyponatremia: Secondary | ICD-10-CM

## 2016-06-21 DIAGNOSIS — J470 Bronchiectasis with acute lower respiratory infection: Secondary | ICD-10-CM

## 2016-06-21 DIAGNOSIS — I739 Peripheral vascular disease, unspecified: Secondary | ICD-10-CM

## 2016-06-21 DIAGNOSIS — D473 Essential (hemorrhagic) thrombocythemia: Secondary | ICD-10-CM

## 2016-06-21 DIAGNOSIS — J9601 Acute respiratory failure with hypoxia: Secondary | ICD-10-CM

## 2016-06-21 DIAGNOSIS — J441 Chronic obstructive pulmonary disease with (acute) exacerbation: Secondary | ICD-10-CM | POA: Diagnosis present

## 2016-06-21 DIAGNOSIS — E876 Hypokalemia: Secondary | ICD-10-CM

## 2016-06-21 DIAGNOSIS — E43 Unspecified severe protein-calorie malnutrition: Secondary | ICD-10-CM | POA: Insufficient documentation

## 2016-06-21 DIAGNOSIS — D75839 Thrombocytosis, unspecified: Secondary | ICD-10-CM

## 2016-06-21 DIAGNOSIS — J471 Bronchiectasis with (acute) exacerbation: Secondary | ICD-10-CM

## 2016-06-21 DIAGNOSIS — J479 Bronchiectasis, uncomplicated: Secondary | ICD-10-CM

## 2016-06-21 LAB — CBC WITH DIFFERENTIAL/PLATELET
Basophils Absolute: 0 10*3/uL (ref 0.0–0.1)
Basophils Relative: 0 %
Eosinophils Absolute: 0.2 10*3/uL (ref 0.0–0.7)
Eosinophils Relative: 1 %
HEMATOCRIT: 28.1 % — AB (ref 39.0–52.0)
HEMOGLOBIN: 9.1 g/dL — AB (ref 13.0–17.0)
Lymphocytes Relative: 9 %
Lymphs Abs: 1.6 10*3/uL (ref 0.7–4.0)
MCH: 26.7 pg (ref 26.0–34.0)
MCHC: 32.4 g/dL (ref 30.0–36.0)
MCV: 82.4 fL (ref 78.0–100.0)
MONOS PCT: 6 %
Monocytes Absolute: 0.9 10*3/uL (ref 0.1–1.0)
NEUTROS ABS: 14.3 10*3/uL — AB (ref 1.7–7.7)
NEUTROS PCT: 84 %
Platelets: 487 10*3/uL — ABNORMAL HIGH (ref 150–400)
RBC: 3.41 MIL/uL — AB (ref 4.22–5.81)
RDW: 14.6 % (ref 11.5–15.5)
WBC: 17 10*3/uL — AB (ref 4.0–10.5)

## 2016-06-21 LAB — BASIC METABOLIC PANEL
ANION GAP: 10 (ref 5–15)
BUN: 15 mg/dL (ref 6–20)
CALCIUM: 8 mg/dL — AB (ref 8.9–10.3)
CHLORIDE: 98 mmol/L — AB (ref 101–111)
CO2: 26 mmol/L (ref 22–32)
Creatinine, Ser: 0.68 mg/dL (ref 0.61–1.24)
GFR calc non Af Amer: >60 mL/min (ref >60)
Glucose, Bld: 107 mg/dL — ABNORMAL HIGH (ref 65–99)
Potassium: 3.3 mmol/L — ABNORMAL LOW (ref 3.5–5.1)
Sodium: 134 mmol/L — ABNORMAL LOW (ref 135–145)

## 2016-06-21 LAB — EXPECTORATED SPUTUM ASSESSMENT W GRAM STAIN, RFLX TO RESP C: Special Requests: NORMAL

## 2016-06-21 LAB — MAGNESIUM: MAGNESIUM: 1.8 mg/dL (ref 1.7–2.4)

## 2016-06-21 LAB — STREP PNEUMONIAE URINARY ANTIGEN: Strep Pneumo Urinary Antigen: NEGATIVE

## 2016-06-21 LAB — EXPECTORATED SPUTUM ASSESSMENT W REFEX TO RESP CULTURE

## 2016-06-21 LAB — PHOSPHORUS: Phosphorus: 2.9 mg/dL (ref 2.5–4.6)

## 2016-06-21 LAB — PROCALCITONIN: PROCALCITONIN: 0.3 ng/mL

## 2016-06-21 MED ORDER — IPRATROPIUM-ALBUTEROL 0.5-2.5 (3) MG/3ML IN SOLN
3.0000 mL | Freq: Three times a day (TID) | RESPIRATORY_TRACT | Status: DC
  Administered 2016-06-22 – 2016-06-23 (×4): 3 mL via RESPIRATORY_TRACT
  Filled 2016-06-21 (×4): qty 3

## 2016-06-21 MED ORDER — VITAMINS A & D EX OINT
TOPICAL_OINTMENT | CUTANEOUS | Status: AC
  Administered 2016-06-21: 5
  Filled 2016-06-21: qty 5

## 2016-06-21 MED ORDER — ADULT MULTIVITAMIN W/MINERALS CH
1.0000 | ORAL_TABLET | Freq: Every day | ORAL | Status: DC
  Administered 2016-06-21 – 2016-06-23 (×3): 1 via ORAL
  Filled 2016-06-21 (×3): qty 1

## 2016-06-21 MED ORDER — POTASSIUM CHLORIDE 10 MEQ/100ML IV SOLN
10.0000 meq | INTRAVENOUS | Status: AC
  Administered 2016-06-21 (×4): 10 meq via INTRAVENOUS
  Filled 2016-06-21 (×4): qty 100

## 2016-06-21 MED ORDER — ACETAMINOPHEN 325 MG PO TABS: 650.0000 mg | ORAL_TABLET | Freq: Four times a day (QID) | ORAL | Status: DC | PRN

## 2016-06-21 MED ORDER — IPRATROPIUM-ALBUTEROL 0.5-2.5 (3) MG/3ML IN SOLN
3.0000 mL | Freq: Four times a day (QID) | RESPIRATORY_TRACT | Status: DC
  Administered 2016-06-21 (×3): 3 mL via RESPIRATORY_TRACT
  Filled 2016-06-21 (×2): qty 3

## 2016-06-21 NOTE — Care Management Note (Signed)
Case Management Note  Patient Details  Name: William Kirk MRN: [id number] Date of Birth: Feb 07, 1941  Subjective/Objective:        COPD Flare            Action/Plan: Date:  Jun 21, 2016  Chart reviewed for concurrent status and case management needs.  Will continue to follow patient progress.  Discharge Planning: following for needs  Expected discharge date: [id number]  Velva Harman, BSN, [place], [place]   Expected Discharge Date:                  Expected Discharge Plan:  Home/Self Care  In-House Referral:     Discharge planning Services  CM Consult  Post Acute Care Choice:    Choice offered to:     DME Arranged:    DME Agency:     HH Arranged:    Uncertain Agency:     Status of Service:  Completed, signed off  If discussed at [employer] of Stay Meetings, dates discussed:    Additional Comments:  Leeroy Cha, RN 06/21/2016, 9:30 AM

## 2016-06-21 NOTE — Progress Notes (Signed)
Initial Nutrition Assessment  DOCUMENTATION CODES:   Non-severe (moderate) malnutrition in context of chronic illness  INTERVENTION:  - Continue Ensure Enlive BID, each supplement provides 350 kcal and 20 grams of protein - Continue to encourage PO intakes of meals and supplements. - Will order daily multivitamin with minerals.  NUTRITION DIAGNOSIS:   Malnutrition (moderate/non-severe) related to chronic illness (COPD with recurrent PNA) as evidenced by mild depletion of body fat, mild depletion of muscle mass.  GOAL:   Patient will meet greater than or equal to 90% of their needs  MONITOR:   PO intake, Supplement acceptance, Weight trends, Labs  REASON FOR ASSESSMENT:   Malnutrition Screening Tool, Consult Assessment of nutrition requirement/status  ASSESSMENT:   76 y.o. male with medical history significant of COPD not on home O2 and PVD presenting with persistent respiratory issues.  He reports that he has been on "antibiotics for almost a year, trying to get rid of it".  Dr. Gwenette Greet said "you got little boogers in your lung".  Recurrent PNA.  Current symptoms since last Monday, a week now.  Fever daily between 3-4 pm and goes to 101.2.  +cough - productive of light gray sputum.  No SOB.  Most recent antibiotic was Cipro - started by Dr. Gwenette Greet last Monday - 1 1/2 tabs PO BID.  Pt seen for MST and consult. BMI indicates normal weight. No intakes documented since admission. Pt states that he ordered yogurt for lunch and that he did not eat breakfast d/t poor appetite and decreased desire to eat. He states that he has been on-and-off antibiotics since last summer and that he has had decreased appetite, decreased taste sensation, and decreased desire to eat d/t the same. He drank a little bit of Ensure this AM. He denies increased SOB with PO intakes and denies any abdominal pain or nausea with intakes.   Physical assessment shows mild fat wasting to upper arm and mild muscle wasting  to shoulder and clavicle areas. He reports UBW of 175 lbs and that he last weighed this last summer. This would indicate 11 lb weight loss (6.3% body weight) in 9-12 months which is not significant for time frames.  Medications reviewed; 10 mEq IV KCl x3 runs yesterday and x4 runs today, 40 mEq oral KCl x2 doses yesterday, 1000 mcg oral vitamin B12/day.  Labs reviewed; Na: 134 mmol/L, K: 3.3 mmol/L, Cl: 98 mmol/L, Ca: 8 mg/dL.  IVF: NS @ 75 mL/hr.   Diet Order:  Diet regular Room service appropriate? Yes; Fluid consistency: Thin  Skin:  Reviewed, no issues  Last BM:  5/15  Height:   Ht Readings from Last 1 Encounters:  06/20/16 5\' 11"  (1.803 m)    Weight:   Wt Readings from Last 1 Encounters:  06/20/16 164 lb (74.4 kg)    Ideal Body Weight:  78.18 kg  BMI:  Body mass index is 22.87 kg/m.  Estimated Nutritional Needs:   Kcal:  9407-6808 (25-28 kcal/kg)  Protein:  75-85 grams   Fluid:  1.8-2 L/day  EDUCATION NEEDS:   No education needs identified at this time    Jarome Matin, MS, RD, LDN, CNSC Inpatient Clinical Dietitian Pager # [phone] After hours/weekend pager # [phone]

## 2016-06-21 NOTE — Evaluation (Signed)
Physical Therapy Evaluation Patient Details Name: William Kirk MRN: [id number] DOB: 06-Feb-1941 Today's Date: 06/21/2016   History of Present Illness   76 year old heavy ex-smoker with COPD and bronchiectasis admitted with pneumonia on 06/20/16.  Clinical Impression  The patient is mobilizing we.. Oxygen saturation on ?RA to ambulate to BR dropped  To 86%. Required about 5 minutes to return to 90% on 4 l. Pt admitted with above diagnosis. Pt currently with functional limitations due to the deficits listed below (see PT Problem List). Pt will benefit from skilled PT to increase their independence and safety with mobility to allow discharge to the venue listed below.       Follow Up Recommendations No PT follow up    Equipment Recommendations  None recommended by PT    Recommendations for Other Services       Precautions / Restrictions Precautions Precaution Comments: oxygen saturation test      Mobility  Bed Mobility Overal bed mobility: Independent                Transfers Overall transfer level: Independent                  Ambulation/Gait Ambulation/Gait assistance: Supervision Ambulation Distance (Feet): 20 Feet (x 2) Assistive device: None Gait Pattern/deviations: WFL(Within Functional Limits)     General Gait Details: patient ambulated to BR on RA. Sats 86%. Required 5 minutes on 4 L [place] to return to 90%.   Stairs            Wheelchair Mobility    Modified Rankin (Stroke Patients Only)       Balance Overall balance assessment: Independent                                           Pertinent Vitals/Pain Pain Assessment: No/denies pain    Home Living Family/patient expects to be discharged to:: Private residence Living Arrangements: Spouse/significant other Available Help at Discharge: Family Type of Home: House Home Access: Stairs to enter Entrance Stairs-Rails: None Secretary/administrator of Steps: 1 Home  Layout: One level Home Equipment: None      Prior Function Level of Independence: Independent               Hand Dominance        Extremity/Trunk Assessment   Upper Extremity Assessment Upper Extremity Assessment: Overall WFL for tasks assessed    Lower Extremity Assessment Lower Extremity Assessment: Overall WFL for tasks assessed    Cervical / Trunk Assessment Cervical / Trunk Assessment: Normal  Communication   Communication: No difficulties  Cognition Arousal/Alertness: Awake/alert Behavior During Therapy: WFL for tasks assessed/performed;Impulsive                                          General Comments      Exercises     Assessment/Plan    PT Assessment Patient needs continued PT services  PT Problem List Cardiopulmonary status limiting activity       PT Treatment Interventions Gait training;Functional mobility training;Therapeutic activities    PT Goals (Current goals can be found in the Care Plan section)  Acute Rehab PT Goals Patient Stated Goal: to go home PT Goal Formulation: With patient Time For Goal Achievement: 07/05/16 Potential to Achieve Goals: Good  Frequency Min 3X/week   Barriers to discharge        Co-evaluation               AM-PAC PT "6 Clicks" Daily Activity  Outcome Measure Difficulty turning over in bed (including adjusting bedclothes, sheets and blankets)?: None Difficulty moving from lying on back to sitting on the side of the bed? : None Difficulty sitting down on and standing up from a chair with arms (e.g., wheelchair, bedside commode, etc,.)?: None Help needed moving to and from a bed to chair (including a wheelchair)?: A Little Help needed walking in hospital room?: A Little Help needed climbing 3-5 steps with a railing? : A Little 6 Click Score: 21    End of Session   Activity Tolerance: Treatment limited secondary to medical complications (Comment) Patient left: with call  bell/phone within reach;in bed Nurse Communication: Mobility status PT Visit Diagnosis: Difficulty in walking, not elsewhere classified (R26.2)    Time: 2956-2130 PT Time Calculation (min) (ACUTE ONLY): 25 min   Charges:   PT Evaluation $PT Eval Low Complexity: 1 Procedure PT Treatments $Gait Training: 8-22 mins   PT G Codes:       Rada Hay 06/21/2016, 11:18 AM Blanchard Kelch PT [phone]

## 2016-06-21 NOTE — Progress Notes (Signed)
PROGRESS NOTE    William Kirk  ZOX:[id number] DOB: [birth year] DOA: 06/20/2016 PCP: No primary care provider on file.  Brief Narrative:  William Kirk is a 76 y.o. male with medical history significant of COPD not on home O2 and PVD presenting with persistent respiratory issues.  He reports that he has been on "antibiotics for almost a year, trying to get rid of it.".Dr. Shelle Iron, his primary Pulmonologist, said "you got little boogers in your lung".  Recurrent PNA. Current symptoms since last Monday, a week now. Fever daily between 3-4 pm and goes to 101.2.  +cough - productive of light gray sputum.  No SOB. Most recent antibiotic was Cipro - started by Dr. Shelle Iron last Monday - 1 1/2 tabs PO BID.  Had an yesterday at the [place] today - Dr. Allyne Gee and Dr. Shelle Iron told him to go check in at the hospital.  [place] in [place] didn't have a bed so he went to [facility]. Admitted for Multifocal PNA/Bronchiectasis and Acute Respiratory Failure in the setting of Moderate COPD.   Assessment & Plan:   Principal Problem:   Multifocal pneumonia Active Problems:   COPD (chronic obstructive pulmonary disease) (HCC)   Acute respiratory failure with hypoxia (HCC)   Hypokalemia   Hyponatremia   Thrombocytosis (HCC)   PVD (peripheral vascular disease) (HCC)   Bronchiectasis (HCC)  Recurrent Progressive Multifocal PNA/Bronchiectasis in the setting of COPD; has Hx of Pansensitive Pseudomonas PNA -CXR showed Interstitial and alveolar airspace opacities compatible multifocal pneumonia, metastatic disease less likely. It also showed COPD. Followup PA and lateral chest X-ray is recommended in 3-4 weeks following trial ofantibiotic therapy to ensure resolution and exclude underlying malignancy. -CURB-65 score is 2, meaning that the patient has an estimated mortality risk of 9.2%. -He was accepted for inpatient admission to the SDU in transfer from [facility] -C/w start Vanc/Zosyn due to multiple prior treatments; D/C  Vancomycin if no MRSA is isolated -IVF with NS @ 75cc/hr -Obtained Pulmonary Consultation and appreciated Recc's and evaluation.  -Patient going for Hi-Res Chest CT today -Per Pulmonary doubt he needs Bronchoscopy at this point -Fever control with Acetaminophen 650 mg po q6hprn  -Sputum culture is Pending  -Blood cultures x 2 Pending -Strep Pneumo Ur Ag Negative -ICU procalcitonin levels ordered and wer 0.30.  >0.5 indicates infection and >>0.5 indicates more serious disease.  As the procalcitonin level normalizes, it will be reasonable to consider de-escalation of antibiotic coverage. -WBC trending down from 10.2 -> 9.1 -[facility] and will need to Discuss with Dr. Shelle Iron -Continue with Budesonide-Formoterol 2 puff IH BID  -Holding Tiotropium 18 mcg IH capsules and starting DuoNeb q6h and c/w Albuterol 2.5 mg Neb q2hprn -Repeat CBC in am  Acute Respiratory Failure with Hypoxia -Patient was Hypoxic on Room Air and now requiring 4 Liters of Supplemental O2 -C/w Supplemental O2 via [place] and Maintain O2 Saturations > 92% -Continuous Pulse Oximetry and Wean O2 as tolerated  Hypokalemia -Patient's K+ this Am was 3.3 -Replete with IV KCl 30 mEQ and po KCl 40 mEQ BID yesterday -Will replete with IV KCl 40 mEQ today  -Repeat CMP in AM  Hyponatremia -Patient's Na+ was 132 and improving to 132 -C/w NS at 75 mL/hr  Thrombocytosis -Likely Reactive to illness. -Patient's Platelet Count went from 535 -> 487 -Repeat CBC in AM  Peripheral Vascular Disease s/p previous PTCA on RLE -C/wASA 81 mg po Daily and with Cilostazol 100 mg po BID  Normocytic Anemia -Patient's Hb/Hct went  from 10.2/30.6 -> 9.1/28.1; Suspect Dilutional Drop -No baseline Comparison -Repeat CBC in AM  DVT prophylaxis: Enoxaparin 40 mg sq q24h Code Status: FULL CODE Family Communication: No family present at bedside Disposition Plan: Remain in SDU today and transfer to medical floor in AM if Stable; PT  recommends no follow up.   Consultants:   Pulmonary Dr. Vassie Loll   Procedures: Hi-Res Chest CT   Antimicrobials: Anti-infectives    Start     Dose/Rate Route Frequency Ordered Stop   06/21/16 0200  vancomycin (VANCOCIN) IVPB 750 mg/150 ml premix     750 mg 150 mL/hr over 60 Minutes Intravenous Every 8 hours 06/20/16 2205     06/20/16 2300  piperacillin-tazobactam (ZOSYN) IVPB 3.375 g     3.375 g 12.5 mL/hr over 240 Minutes Intravenous Every 8 hours 06/20/16 2024     06/20/16 2200  ceFEPIme (MAXIPIME) 1 g in dextrose 5 % 50 mL IVPB  Status:  Discontinued     1 g 100 mL/hr over 30 Minutes Intravenous Every 8 hours 06/20/16 2134 06/20/16 2149   06/20/16 1800  piperacillin-tazobactam (ZOSYN) IVPB 3.375 g  Status:  Discontinued     3.375 g 100 mL/hr over 30 Minutes Intravenous Every 6 hours 06/20/16 1628 06/20/16 2023   06/20/16 1600  vancomycin (VANCOCIN) IVPB 1000 mg/200 mL premix     1,000 mg 200 mL/hr over 60 Minutes Intravenous  Once 06/20/16 1553 06/20/16 1819   06/20/16 1600  piperacillin-tazobactam (ZOSYN) IVPB 4.5 g  Status:  Discontinued     4.5 g 200 mL/hr over 30 Minutes Intravenous  Once 06/20/16 1553 06/20/16 1628     Subjective: Seen and examined at bedside and was receiving a breathing treatment. States he has had symptoms for a few days and has had multiple pneumonias this year. Had some SOB. Coughing some. No CP or Lightheadedness.   Objective: Vitals:   06/21/16 0401 06/21/16 0800 06/21/16 0856 06/21/16 1000  BP:  (!) 136/50  (!) 152/41  Pulse:      Resp:  (!) 22  (!) 32  Temp: 98 F (36.7 C) 98 F (36.7 C)    TempSrc: Oral Oral    SpO2:  91% 92% 90%  Weight:      Height:        Intake/Output Summary (Last 24 hours) at 06/21/16 1132 Last data filed at 06/21/16 1000  Gross per 24 hour  Intake             1500 ml  Output              250 ml  Net             1250 ml   Filed Weights   06/20/16 1411  Weight: 74.4 kg (164 lb)   Examination: Physical  Exam:  Constitutional:  NAD and appears calm and comfortable Eyes: Lids and conjunctivae normal, sclerae anicteric  ENMT: External Ears, Nose appear normal. Grossly normal hearing. Mucous membranes are moist.   Neck: Appears normal, supple, no cervical masses, normal ROM, no appreciable thyromegaly, no visible JVD Respiratory: Diminished to auscultation bilaterally worse on Right, no wheezing or rhonchi but mild dry crackles. Increased respiratory effort. No accessory muscle use. Wearing supplemental O2 via [place] Cardiovascular: RRR, no murmurs / rubs / gallops. S1 and S2 auscultated. No extremity edema.  Abdomen: Soft, non-tender, non-distended. No masses palpated. No appreciable hepatosplenomegaly. Bowel sounds positive. GU: Deferred. Musculoskeletal: No clubbing / cyanosis of digits/nails. No joint deformity upper  and lower extremities. No Contractures.  Skin: No rashes, lesions, ulcers. No induration; Warm and dry.  Neurologic: CN 2-12 grossly intact with no focal deficits. Romberg sign cerebellar reflexes not assessed.  Psychiatric: Normal judgment and insight. Alert and oriented x 3. Normal mood and appropriate affect.   Data Reviewed: I have personally reviewed following labs and imaging studies  CBC:  Recent Labs Lab 06/20/16 1430 06/21/16 0213  WBC 19.9* 17.0*  NEUTROABS 16.9* 14.3*  HGB 10.2* 9.1*  HCT 30.6* 28.1*  MCV 82.7 82.4  PLT 535* 487*   Basic Metabolic Panel:  Recent Labs Lab 06/20/16 1430 06/21/16 0130 06/21/16 0213  NA 132*  --  134*  K 2.6*  --  3.3*  CL 95*  --  98*  CO2 27  --  26  GLUCOSE 105*  --  107*  BUN 21*  --  15  CREATININE 0.81  --  0.68  CALCIUM 8.1*  --  8.0*  MG  --  1.8  --   PHOS  --  2.9  --    GFR: Estimated Creatinine Clearance: 86.5 mL/min (by C-G formula based on SCr of 0.68 mg/dL). Liver Function Tests: No results for input(s): AST, ALT, ALKPHOS, BILITOT, PROT, ALBUMIN in the last 168 hours. No results for input(s): LIPASE,  AMYLASE in the last 168 hours. No results for input(s): AMMONIA in the last 168 hours. Coagulation Profile: No results for input(s): INR, PROTIME in the last 168 hours. Cardiac Enzymes:  Recent Labs Lab 06/20/16 1430  TROPONINI <0.03   BNP (last 3 results) No results for input(s): PROBNP in the last 8760 hours. HbA1C: No results for input(s): HGBA1C in the last 72 hours. CBG: No results for input(s): GLUCAP in the last 168 hours. Lipid Profile: No results for input(s): CHOL, HDL, LDLCALC, TRIG, CHOLHDL, LDLDIRECT in the last 72 hours. Thyroid Function Tests: No results for input(s): TSH, T4TOTAL, FREET4, T3FREE, THYROIDAB in the last 72 hours. Anemia Panel: No results for input(s): VITAMINB12, FOLATE, FERRITIN, TIBC, IRON, RETICCTPCT in the last 72 hours. Sepsis Labs:  Recent Labs Lab 06/21/16 0130  PROCALCITON 0.30    Recent Results (from the past 240 hour(s))  MRSA PCR Screening     Status: None   Collection Time: 06/20/16  8:22 PM  Result Value Ref Range Status   MRSA by PCR NEGATIVE NEGATIVE Final    Comment:        The GeneXpert MRSA Assay (FDA approved for NASAL specimens only), is one component of a comprehensive MRSA colonization surveillance program. It is not intended to diagnose MRSA infection nor to guide or monitor treatment for MRSA infections.   Culture, sputum-assessment     Status: None   Collection Time: 06/20/16 11:10 PM  Result Value Ref Range Status   Specimen Description SPUTUM  Final   Special Requests Normal  Final   Sputum evaluation THIS SPECIMEN IS ACCEPTABLE FOR SPUTUM CULTURE  Final   Report Status 06/21/2016 FINAL  Final    Radiology Studies: Dg Chest 2 View  Result Date: 06/20/2016 CLINICAL DATA:  Diagnosed with pneumonia Jun 13, 2016. Shortness of breath. History of COPD. EXAM: CHEST  2 VIEW COMPARISON:  Chest radiograph February 21, 2009 FINDINGS: Interstitial and alveolar airspace opacities throughout the RIGHT greater than  LEFT lung with nodular appearance. No pleural effusion. Cardiomediastinal silhouette is normal, mildly calcified aortic knob. Increased lung volumes with flattened hemidiaphragms. Patient rotated to the RIGHT. No pneumothorax. Soft tissue planes and included osseous  structures are nonsuspicious. IMPRESSION: Interstitial and alveolar airspace opacities compatible multifocal pneumonia, metastatic disease less likely. Followup PA and lateral chest X-ray is recommended in 3-4 weeks following trial of antibiotic therapy to ensure resolution and exclude underlying malignancy. COPD. Electronically Signed   By: Awilda Metro M.D.   On: 06/20/2016 15:00   Scheduled Meds: . aspirin  81 mg Oral Daily  . budesonide-formoterol  2 puff Inhalation BID  . cilostazol  100 mg Oral BID  . enoxaparin (LOVENOX) injection  40 mg Subcutaneous Q24H  . feeding supplement (ENSURE ENLIVE)  237 mL Oral BID BM  . gabapentin  300 mg Oral QHS  . ipratropium-albuterol  3 mL Nebulization Q6H  . sertraline  200 mg Oral Daily  . traZODone  50 mg Oral QHS  . vitamin B-12  1,000 mcg Oral Daily   Continuous Infusions: . sodium chloride 75 mL/hr at 06/21/16 1000  . piperacillin-tazobactam (ZOSYN)  IV Stopped (06/21/16 0843)  . potassium chloride    . vancomycin Stopped (06/21/16 1023)    LOS: 1 day   Merlene Laughter, DO [facility] Pager [phone]  If 7PM-7AM, please contact night-coverage [email or web address] Password [facility] 06/21/2016, 11:32 AM

## 2016-06-21 NOTE — Progress Notes (Signed)
Patient being transferred to 1519. Report given to RN on that floor. Tele and E-Link  Aware.

## 2016-06-21 NOTE — Consult Note (Signed)
Name: William Kirk MRN: [id number] DOB: 28-[name]-1941    ADMISSION DATE:  06/20/2016 CONSULTATION DATE:  06/21/2016  REFERRING MD :  Richardson Chiquito, triad  CHIEF COMPLAINT:  Pneumonia, fevers   HISTORY OF PRESENT ILLNESS:  76 year old heavy ex-smoker with COPD and bronchiectasis admitted with pneumonia. History is obtained after speaking to the patient and his wife [name] and after phone conversation with his pulmonologist at the [place] He follows up at the [place] with Dr. Shelle Iron and Dr. Allyne Gee from ID. He smoked about a pack every 2 days and quit about a year ago-more than 25 pack years. He is maintained on Symbicort and Spiriva, So presume he does have moderate airway obstruction. He had persistent cough and pneumonia and 2017 and after consultation with ID was put on azithromycin 3 times a week for almost a year. His initial sputum Culture showed pansensitive Serratia. He did well on the azithromycin regimen Until recently when he started developing fevers again and chest x-ray showed right-sided infiltrates. He was initially treated with Cipro which did not work and hence was changed to Levaquin. Sputum culture this time showed a pansensitive Pseudomonas, he was put back on Cipro but developed fevers on this. That is when he was advised hospital admission, since no beds at the [facility] he came to [facility] emergency room He is requiring about 4 L of oxygen, afebrile last 24 hours, feels improved  PAST MEDICAL HISTORY :   has a past medical history of Arthritis; COPD (chronic obstructive pulmonary disease) (HCC); and PVD (peripheral vascular disease) (HCC) (2014).  has a past surgical history that includes Hip surgery (Right, 2009); Knee arthroscopy (Right, 2010); and knee aeroscopic (Right, 2012). Prior to Admission medications   Medication Sig Start Date End Date Taking? Authorizing Provider  acetaminophen (TYLENOL) 325 MG tablet Take 650 mg by mouth every 6 (six) hours as needed for mild pain or moderate  pain.   Yes [provider]  albuterol (PROVENTIL HFA;VENTOLIN HFA) 108 (90 Base) MCG/ACT inhaler Inhale 1-2 puffs into the lungs every 6 (six) hours as needed for wheezing or shortness of breath.   Yes [provider]  aspirin 81 MG tablet Take 81 mg by mouth daily.   Yes [provider]  Budesonide-Formoterol Fumarate (SYMBICORT IN) Inhale 2 puffs into the lungs 2 (two) times daily.    Yes [provider]  cilostazol (PLETAL) 100 MG tablet Take 100 mg by mouth 2 (two) times daily.    Yes [provider]  gabapentin (NEURONTIN) 300 MG capsule Take 300 mg by mouth at bedtime.    Yes [provider]  HYDROcodone-acetaminophen (NORCO) 10-325 MG tablet Take 1-2 tablets by mouth every 8 (eight) hours as needed.   Yes [provider]  Multiple Vitamins-Minerals (MULTIVITAMIN WITH MINERALS) tablet Take 1 tablet by mouth daily.   Yes [provider]  sertraline (ZOLOFT) 100 MG tablet Take 200 mg by mouth daily.    Yes [provider]  testosterone cypionate (DEPOTESTOTERONE CYPIONATE) 100 MG/ML injection Inject 200 mg into the muscle every 28 (twenty-eight) days.    Yes [provider]  tiotropium (SPIRIVA) 18 MCG inhalation capsule Place 18 mcg into inhaler and inhale 2 (two) times daily.    Yes [provider]  traZODone (DESYREL) 50 MG tablet Take 50 mg by mouth at bedtime.   Yes [provider]  Vit C-Cholecalciferol-Rose Hip (VITAMIN C & D3/ROSE HIPS PO) Take 1 capsule by mouth daily.   Yes  [provider]  vitamin B-12 (CYANOCOBALAMIN) 1000 MCG tablet Take 1,000 mcg by mouth daily.   Yes [provider]   No Known Allergies  FAMILY HISTORY:  family history includes Cancer (age of onset: 9) in his father; Dementia in his mother. SOCIAL HISTORY:  reports that he quit smoking about 16 months ago. His smoking use included Cigarettes. He has a 50.00 pack-year smoking history.  He has never used smokeless tobacco. He reports that he does not drink alcohol or use drugs.  REVIEW OF SYSTEMS:   Positive for fevers, chills, cough with sputum production  Constitutional: Negative for weight loss, malaise/fatigue and diaphoresis.  HENT: Negative for hearing loss, ear pain, nosebleeds, congestion, sore throat, neck pain, tinnitus and ear discharge.   Eyes: Negative for blurred vision, double vision, photophobia, pain, discharge and redness.  Respiratory: Negative for  hemoptysis, shortness of breath, wheezing and stridor.   Cardiovascular: Negative for chest pain, palpitations, orthopnea, claudication, leg swelling and PND.  Gastrointestinal: Negative for heartburn, nausea, vomiting, abdominal pain, diarrhea, constipation, blood in stool and melena.  Genitourinary: Negative for dysuria, urgency, frequency, hematuria and flank pain.  Musculoskeletal: Negative for myalgias, back pain, joint pain and falls.  Skin: Negative for itching and rash.  Neurological: Negative for dizziness, tingling, tremors, sensory change, speech change, focal weakness, seizures, loss of consciousness, weakness and headaches.  Endo/Heme/Allergies: Negative for environmental allergies and polydipsia. Does not bruise/bleed easily.  SUBJECTIVE:   VITAL SIGNS: Temp:  [98 F (36.7 C)-99.4 F (37.4 C)] 98 F (36.7 C) (05/15 0800) Pulse Rate:  [70-80] 70 (05/14 1901) Resp:  [13-32] 22 (05/15 0800) BP: (100-143)/(39-76) 136/50 (05/15 0800) SpO2:  [89 %-95 %] 92 % (05/15 0856) Weight:  [164 lb (74.4 kg)] 164 lb (74.4 kg) (05/14 1411)  PHYSICAL EXAMINATION: Gen. Pleasant, well-nourished, lean, in no distress, normal affect ENT - no lesions, no post nasal drip Neck: No JVD, no thyromegaly, no carotid bruits Lungs: no use of accessory muscles, no dullness to percussion, decreased BL without rales or rhonchi  Cardiovascular: Rhythm regular, heart sounds  normal, no murmurs or gallops, no peripheral  edema Abdomen: soft and non-tender, no hepatosplenomegaly, BS normal. Musculoskeletal: No deformities, no cyanosis or clubbing Neuro:  alert, non focal    Recent Labs Lab 06/20/16 1430 06/21/16 0213  NA 132* 134*  K 2.6* 3.3*  CL 95* 98*  CO2 27 26  BUN 21* 15  CREATININE 0.81 0.68  GLUCOSE 105* 107*    Recent Labs Lab 06/20/16 1430 06/21/16 0213  HGB 10.2* 9.1*  HCT 30.6* 28.1*  WBC 19.9* 17.0*  PLT 535* 487*   Dg Chest 2 View  Result Date: 06/20/2016 CLINICAL DATA:  Diagnosed with pneumonia Jun 13, 2016. Shortness of breath. History of COPD. EXAM: CHEST  2 VIEW COMPARISON:  Chest radiograph February 21, 2009 FINDINGS: Interstitial and alveolar airspace opacities throughout the RIGHT greater than LEFT lung with nodular appearance. No pleural effusion. Cardiomediastinal silhouette is normal, mildly calcified aortic knob. Increased lung volumes with flattened hemidiaphragms. Patient rotated to the RIGHT. No pneumothorax. Soft tissue planes and included osseous structures are nonsuspicious. IMPRESSION: Interstitial and alveolar airspace opacities compatible multifocal pneumonia, metastatic disease less likely. Followup PA and lateral chest X-ray is recommended in 3-4 weeks following trial of antibiotic therapy to ensure resolution and exclude underlying malignancy. COPD. Electronically Signed   By: Awilda Metro M.D.   On: 06/20/2016 15:00    ASSESSMENT / PLAN: Bronchiectasis with acute exacerbation due to pansensitive  pseudomonas -although clinically has not responded to ciprofloxacin COPD Acute hypoxic respiratory failure requiring 4 L oxygen  Recommend Agree with IV Zosyn and vancomycin for now, obtain sputum culture and then can discontinue vancomycin if no MRSA isolated It will be tricky to decide what antibiotic to eventually discharged him on since he has not responded to ciprofloxacin as an outpatient although the pseudomonas is pansensitive-we will see what our  cultures show, discuss with Dr. Shelle Iron, he may have had some clinical response to Levaquin  Doubt need for bronchoscopy at this point Await CT chest  Okay to continue Symbicort and DuoNeb's   Cyril Mourning MD. FCCP. [facility] Pager [phone] If no response call [phone]   06/21/2016, 10:24 AM

## 2016-06-22 DIAGNOSIS — E440 Moderate protein-calorie malnutrition: Secondary | ICD-10-CM

## 2016-06-22 LAB — COMPREHENSIVE METABOLIC PANEL
ALBUMIN: 2.1 g/dL — AB (ref 3.5–5.0)
ALT: 112 U/L — ABNORMAL HIGH (ref 17–63)
ANION GAP: 11 (ref 5–15)
AST: 95 U/L — AB (ref 15–41)
Alkaline Phosphatase: 95 U/L (ref 38–126)
BILIRUBIN TOTAL: 0.5 mg/dL (ref 0.3–1.2)
BUN: 10 mg/dL (ref 6–20)
CHLORIDE: 104 mmol/L (ref 101–111)
CO2: 23 mmol/L (ref 22–32)
Calcium: 8.1 mg/dL — ABNORMAL LOW (ref 8.9–10.3)
Creatinine, Ser: 0.63 mg/dL (ref 0.61–1.24)
GFR calc Af Amer: >60 mL/min (ref >60)
GFR calc non Af Amer: >60 mL/min (ref >60)
GLUCOSE: 89 mg/dL (ref 65–99)
Potassium: 3.1 mmol/L — ABNORMAL LOW (ref 3.5–5.1)
Sodium: 138 mmol/L (ref 135–145)
TOTAL PROTEIN: 6 g/dL — AB (ref 6.5–8.1)

## 2016-06-22 LAB — CBC WITH DIFFERENTIAL/PLATELET
BASOS ABS: 0 10*3/uL (ref 0.0–0.1)
Basophils Relative: 0 %
Eosinophils Absolute: 0.1 10*3/uL (ref 0.0–0.7)
Eosinophils Relative: 1 %
HCT: 29.6 % — ABNORMAL LOW (ref 39.0–52.0)
Hemoglobin: 9.7 g/dL — ABNORMAL LOW (ref 13.0–17.0)
Lymphocytes Relative: 10 %
Lymphs Abs: 1.2 10*3/uL (ref 0.7–4.0)
MCH: 26.9 pg (ref 26.0–34.0)
MCHC: 32.8 g/dL (ref 30.0–36.0)
MCV: 82.2 fL (ref 78.0–100.0)
Monocytes Absolute: 0.7 10*3/uL (ref 0.1–1.0)
Monocytes Relative: 6 %
Neutro Abs: 10.5 10*3/uL — ABNORMAL HIGH (ref 1.7–7.7)
Neutrophils Relative %: 83 %
PLATELETS: 547 10*3/uL — AB (ref 150–400)
RBC: 3.6 MIL/uL — AB (ref 4.22–5.81)
RDW: 14.7 % (ref 11.5–15.5)
WBC: 12.6 10*3/uL — AB (ref 4.0–10.5)

## 2016-06-22 LAB — PROCALCITONIN: PROCALCITONIN: 0.14 ng/mL

## 2016-06-22 LAB — PHOSPHORUS: PHOSPHORUS: 2.8 mg/dL (ref 2.5–4.6)

## 2016-06-22 LAB — MAGNESIUM: Magnesium: 1.8 mg/dL (ref 1.7–2.4)

## 2016-06-22 MED ORDER — PREMIER PROTEIN SHAKE
11.0000 [oz_av] | ORAL | Status: DC
  Administered 2016-06-22: 11 [oz_av] via ORAL
  Filled 2016-06-22 (×2): qty 325.31

## 2016-06-22 MED ORDER — BOOST / RESOURCE BREEZE PO LIQD
1.0000 | ORAL | Status: DC
  Administered 2016-06-23: 1 via ORAL

## 2016-06-22 MED ORDER — PREMIER PROTEIN SHAKE: 2.0000 [oz_av] | Freq: Three times a day (TID) | ORAL | Status: DC

## 2016-06-22 MED ORDER — POTASSIUM CHLORIDE CRYS ER 20 MEQ PO TBCR
40.0000 meq | EXTENDED_RELEASE_TABLET | ORAL | Status: AC
  Administered 2016-06-22 (×2): 40 meq via ORAL
  Filled 2016-06-22 (×2): qty 2

## 2016-06-22 NOTE — Progress Notes (Signed)
Name: William Kirk MRN: [id number] DOB: 24-Nov-1940    ADMISSION DATE:  06/20/2016 CONSULTATION DATE:  06/22/2016  REFERRING MD :  Richardson Chiquito, triad  CHIEF COMPLAINT:  Pneumonia, fevers   HISTORY OF PRESENT ILLNESS:  76 year old heavy ex-smoker with COPD and bronchiectasis admitted with pneumonia. He follows up at the [place] with Dr. Shelle Iron and Dr. Allyne Gee from ID. He smoked about a pack every 2 days and quit about a year ago-more than 25 pack years. He is maintained on Symbicort and Spiriva, So presume he does have moderate airway obstruction. He had persistent cough and pneumonia and 2017 and after consultation with ID was put on azithromycin 3 times a week for almost a year. His initial sputum Culture showed pansensitive Serratia. He did well on the azithromycin regimen Until recently when he started developing fevers again and chest x-ray showed right-sided infiltrates. He was initially treated with Cipro which did not work and hence was changed to Levaquin. Sputum culture this time showed a pansensitive Pseudomonas, he was put back on Cipro but developed fevers on this.   SUBJECTIVE:  Afebrile Denies pain Cough has subsided   VITAL SIGNS: Temp:  [97.3 F (36.3 C)-98 F (36.7 C)] 97.4 F (36.3 C) (05/16 0445) Pulse Rate:  [75] 75 (05/16 0445) Resp:  [18-23] 18 (05/16 0445) BP: (123-140)/(56-75) 123/56 (05/16 0445) SpO2:  [91 %-96 %] 94 % (05/16 0932)  PHYSICAL EXAMINATION: Gen. Pleasant, well-nourished, lean, in no distress, normal affect ENT - no lesions, no post nasal drip Neck: No JVD, no thyromegaly, no carotid bruits Lungs: no use of accessory muscles, no dullness to percussion, decreased BL without rales or rhonchi  Cardiovascular: Rhythm regular, heart sounds  normal, no murmurs or gallops, no peripheral edema Abdomen: soft and non-tender, no hepatosplenomegaly, BS normal. Musculoskeletal: No deformities, no cyanosis or clubbing Neuro:  alert, non focal    Recent  Labs Lab 06/20/16 1430 06/21/16 0213 06/22/16 0705  NA 132* 134* 138  K 2.6* 3.3* 3.1*  CL 95* 98* 104  CO2 27 26 23   BUN 21* 15 10  CREATININE 0.81 0.68 0.63  GLUCOSE 105* 107* 89    Recent Labs Lab 06/20/16 1430 06/21/16 0213 06/22/16 0705  HGB 10.2* 9.1* 9.7*  HCT 30.6* 28.1* 29.6*  WBC 19.9* 17.0* 12.6*  PLT 535* 487* 547*   CT chest 5/15 >>Severe bilateral multilobar bronchopneumonia, most confluent in the right upper lobe  ASSESSMENT / PLAN: Bronchiectasis with acute exacerbation due to pansensitive pseudomonas -although clinically has not responded to ciprofloxacin COPD Acute hypoxic respiratory failure requiring 4 L oxygen  Recommend Agree with IV Zosyn and vancomycin for now, await sputum culture and then can discontinue vancomycin if no MRSA isolated It will be tricky to decide what antibiotic to eventually discharged him on since he has failed ciprofloxacin as an outpatient although the pseudomonas is pansensitive-we will see what our cultures show, discuss with Dr. Shelle Iron, he may have had some clinical response to Levaquin  Alternatively can consider placing PI CC line and IV antibiotics  He is really desperate to go home    Okay to continue Symbicort and DuoNeb's  discontinue pulse ox   Cyril Mourning MD. Tonny Bollman. [place] [facility] Pager [phone] If no response call [phone]   06/22/2016, 10:47 AM

## 2016-06-22 NOTE — Progress Notes (Signed)
Physical Therapy Treatment Patient Details Name: William Kirk MRN: [id number] DOB: 1940-06-06 Today's Date: 06/22/2016    History of Present Illness  76 year old heavy ex-smoker with COPD and bronchiectasis admitted with pneumonia on 06/20/16.    PT Comments    Marked improvement in activity tolerance with pt maintaining sats above 90% on 2L but desating to 87% on RA with ambulation.  RN aware.   Follow Up Recommendations  No PT follow up     Equipment Recommendations  None recommended by PT    Recommendations for Other Services       Precautions / Restrictions Precautions Precaution Comments: oxygen saturation test Restrictions Weight Bearing Restrictions: No    Mobility  Bed Mobility Overal bed mobility: Independent                Transfers Overall transfer level: Independent                  Ambulation/Gait Ambulation/Gait assistance: Min guard;Supervision Ambulation Distance (Feet): 800 Feet Assistive device: None Gait Pattern/deviations: WFL(Within Functional Limits) Gait velocity: mod pace   General Gait Details: Initial mild instability much improved with increased distance.  Pt ambulated 200' on 4L, 200' on 3L and 200' on 2L and maintained sats over 90%.  On RA, pt ambulated 200' with sats dropping to 87% and pt reporting mild SOB.  RN aware.   Stairs            Wheelchair Mobility    Modified Rankin (Stroke Patients Only)       Balance Overall balance assessment: No apparent balance deficits (not formally assessed)                                          Cognition Arousal/Alertness: Awake/alert Behavior During Therapy: WFL for tasks assessed/performed;Impulsive Overall Cognitive Status: Within Functional Limits for tasks assessed                                        Exercises      General Comments        Pertinent Vitals/Pain Pain Assessment: No/denies pain    Home Living                       Prior Function            PT Goals (current goals can now be found in the care plan section) Acute Rehab PT Goals Patient Stated Goal: to go home PT Goal Formulation: With patient Time For Goal Achievement: 07/05/16 Potential to Achieve Goals: Good Progress towards PT goals: Progressing toward goals    Frequency    Min 3X/week      PT Plan Current plan remains appropriate    Co-evaluation              AM-PAC PT "6 Clicks" Daily Activity  Outcome Measure  Difficulty turning over in bed (including adjusting bedclothes, sheets and blankets)?: None Difficulty moving from lying on back to sitting on the side of the bed? : None Difficulty sitting down on and standing up from a chair with arms (e.g., wheelchair, bedside commode, etc,.)?: None Help needed moving to and from a bed to chair (including a wheelchair)?: None Help needed walking in hospital room?: A Little Help needed climbing  3-5 steps with a railing? : A Little 6 Click Score: 22    End of Session Equipment Utilized During Treatment: Gait belt;Oxygen Activity Tolerance: Patient tolerated treatment well Patient left: in bed;with call bell/phone within reach;with family/visitor present Nurse Communication: Mobility status PT Visit Diagnosis: Difficulty in walking, not elsewhere classified (R26.2)     Time: 0981-1914 PT Time Calculation (min) (ACUTE ONLY): 24 min  Charges:  $Gait Training: 23-37 mins                    G Codes:       Pg [phone]    Teven Mittman 06/22/2016, 12:02 PM

## 2016-06-22 NOTE — Progress Notes (Signed)
NUTRITION NOTE  Pt seen for initial assessment by this RD yesterday with associated note at 1: 23 PM. Consult received stating pt does not like the taste of Ensure supplement. Will trial Premier Protein once/day (160 kcal and 30 grams of protein) and Boost Breeze once/day (250 kcal and 9 grams of protein). Will continue to follow per protocol and monitor tolerance/acceptance of oral nutrition supplements.   Please re-consult if needed.     Jarome Matin, MS, RD, LDN, [facility] Inpatient Clinical Dietitian Pager # [phone] After hours/weekend pager # [phone]

## 2016-06-22 NOTE — Evaluation (Addendum)
Occupational Therapy Evaluation Patient Details Name: William Kirk MRN: [id number] DOB: 01/06/1941 Today's Date: 06/22/2016    History of Present Illness  76 year old heavy ex-smoker with COPD and bronchiectasis admitted with pneumonia on 06/20/16.   Clinical Impression   This 76 y/o M presents with the above. Pt is independent with ADLs and functional mobility at baseline, and currently requires supervision for functional mobility and MinGuard assist for ADLs, mostly limited by increased fatigue/SOB during ADL task completion. Pt's O2 sats remaining at 90-91% on 2L continuous O2 during this session. Pt will benefit from continued OT services while in acute setting to increase activity tolerance, safety, and independence with ADLs and functional mobility. Education provided and questions answered throughout.     Follow Up Recommendations  No OT follow up;Supervision - Intermittent    Equipment Recommendations  None recommended by OT           Precautions / Restrictions Precautions Precaution Comments: monitor o2 sats Restrictions Weight Bearing Restrictions: No      Mobility Bed Mobility Overal bed mobility: Independent                Transfers Overall transfer level: Independent               General transfer comment: close supervision for safety     Balance Overall balance assessment: No apparent balance deficits (not formally assessed)                                         ADL either performed or assessed with clinical judgement   ADL Overall ADL's : Needs assistance/impaired Eating/Feeding: Independent;Sitting   Grooming: Oral care;Supervision/safety;Standing   Upper Body Bathing: Set up;Sitting   Lower Body Bathing: Min guard;Sit to/from stand   Upper Body Dressing : Set up;Sitting   Lower Body Dressing: Min guard;Sit to/from stand   Toilet Transfer: Min guard;Ambulation;Comfort height toilet   Toileting- Clothing  Manipulation and Hygiene: Min guard;Sit to/from stand       Functional mobility during ADLs: Supervision/safety General ADL Comments: monitored O2 sats throughout session, O2 remaining at 90-91% 2L continuous O2; education provided on energy conservation techniques for completing ADLs/IADLs with handout provided                          Pertinent Vitals/Pain Pain Assessment: Faces Faces Pain Scale: No hurt     Hand Dominance     Extremity/Trunk Assessment Upper Extremity Assessment Upper Extremity Assessment: Overall WFL for tasks assessed           Communication Communication Communication: No difficulties   Cognition Arousal/Alertness: Awake/alert Behavior During Therapy: WFL for tasks assessed/performed Overall Cognitive Status: Within Functional Limits for tasks assessed                                     General Comments                  Home Living Family/patient expects to be discharged to:: Private residence Living Arrangements: Spouse/significant other Available Help at Discharge: Family Type of Home: House Home Access: Stairs to enter Secretary/administrator of Steps: 1   Home Layout: One level     Bathroom Shower/Tub: Producer, television/film/video: Handicapped height     Home  Equipment: Shower seat - built in          Prior Functioning/Environment Level of Independence: Independent                 OT Problem List: Decreased activity tolerance;Cardiopulmonary status limiting activity      OT Treatment/Interventions: Self-care/ADL training;Therapeutic exercise;Energy conservation;Patient/family education;Therapeutic activities    OT Goals(Current goals can be found in the care plan section) Acute Rehab OT Goals Patient Stated Goal: to go home OT Goal Formulation: With patient Time For Goal Achievement: 06/29/16 Potential to Achieve Goals: Good ADL Goals Pt Will Perform Lower Body Dressing: with  supervision;sit to/from stand Pt/caregiver will Perform Home Exercise Program: Increased strength;Both right and left upper extremity;Independently Additional ADL Goal #1: Pt will independently demonstrate at least one energy conservation technique during ADL task completion.   OT Frequency: Min 2X/week                             AM-PAC PT "6 Clicks" Daily Activity     Outcome Measure Help from another person eating meals?: None Help from another person taking care of personal grooming?: A Little Help from another person toileting, which includes using toliet, bedpan, or urinal?: A Little Help from another person bathing (including washing, rinsing, drying)?: A Little Help from another person to put on and taking off regular upper body clothing?: None Help from another person to put on and taking off regular lower body clothing?: A Little 6 Click Score: 20   End of Session Equipment Utilized During Treatment: Oxygen Nurse Communication: Other (comment) (O2 equipment )  Activity Tolerance: Patient tolerated treatment well Patient left: in bed;with call bell/phone within reach;with nursing/sitter in room;with family/visitor present  OT Visit Diagnosis: Muscle weakness (generalized) (M62.81)                Time: 1610-9604 OT Time Calculation (min): 22 min Charges:  OT General Charges $OT Visit: 1 Procedure OT Evaluation $OT Eval Low Complexity: 1 Procedure G-Codes:     Marcy Siren, OT Pager [phone] 06/22/2016   Orlando Penner 06/22/2016, 4:04 PM

## 2016-06-22 NOTE — Progress Notes (Signed)
PROGRESS NOTE    William Kirk   VHQ:[id number]  DOB: [birth year]  DOA: 06/20/2016 PCP: No primary care provider on file.   Brief Narrative:  76 year old male with a history of COPD and chronic bronchiectasis. He has been maintained on Zithromax 3 times a day for this past year due to persistent fevers and cough. The patient recently had a course of ciprofloxacin for 6 days due to recurrence of fevers and cough spite taking Zithromax. Subsequent to this course he developed fevers and chills and was placed on a six-day course of levofloxacin. About a week later he developed symptoms once again and this time was placed on ciprofloxacin which he took for 1 week. Fevers did not resolve and cough became worse and therefore he presented to the hospital.  Subjective: Fevers have resolved and cough is improving.  Assessment & Plan:   Principal Problem:   Multifocal pneumonia/  Acute respiratory failure with hypoxia    Sepsis -Previously has grown out Pseudomonas from his sputum which was sensitive to ciprofloxacin-however he has failed ciprofloxacin prior to presenting to the hospital -He is currently receiving vancomycin and Zosyn-will need to follow-up sputum culture and decide on an oral antibiotic to transition to prior to discharge-appreciate pulmonary's assistance-pulmonary considering Levaquin on discharge based on culture results -Attempting to wean O2-currently requiring 2 L  Active Problems:   COPD (chronic obstructive pulmonary disease)  -No exacerbation -Has quit smoking-does not use oxygen at baseline-follows with pulmonary as outpatient    Hypokalemia   Hyponatremia -Continue to follow and replace as needed    Thrombocytosis (HCC)   PVD (peripheral vascular disease) with history of PTCA and right lower extremity -Aspirin and Pletal    Malnutrition of moderate degree -Continue supplements  DVT prophylaxis: Lovenox Code Status: Full code Family Communication:  Wife Disposition Plan: Home when stable Consultants:   Pulmonary Procedures:    Antimicrobials:  Anti-infectives    Start     Dose/Rate Route Frequency Ordered Stop   06/21/16 0200  vancomycin (VANCOCIN) IVPB 750 mg/150 ml premix     750 mg 150 mL/hr over 60 Minutes Intravenous Every 8 hours 06/20/16 2205     06/20/16 2300  piperacillin-tazobactam (ZOSYN) IVPB 3.375 g     3.375 g 12.5 mL/hr over 240 Minutes Intravenous Every 8 hours 06/20/16 2024     06/20/16 2200  ceFEPIme (MAXIPIME) 1 g in dextrose 5 % 50 mL IVPB  Status:  Discontinued     1 g 100 mL/hr over 30 Minutes Intravenous Every 8 hours 06/20/16 2134 06/20/16 2149   06/20/16 1800  piperacillin-tazobactam (ZOSYN) IVPB 3.375 g  Status:  Discontinued     3.375 g 100 mL/hr over 30 Minutes Intravenous Every 6 hours 06/20/16 1628 06/20/16 2023   06/20/16 1600  vancomycin (VANCOCIN) IVPB 1000 mg/200 mL premix     1,000 mg 200 mL/hr over 60 Minutes Intravenous  Once 06/20/16 1553 06/20/16 1819   06/20/16 1600  piperacillin-tazobactam (ZOSYN) IVPB 4.5 g  Status:  Discontinued     4.5 g 200 mL/hr over 30 Minutes Intravenous  Once 06/20/16 1553 06/20/16 1628       Objective: Vitals:   06/21/16 2050 06/21/16 2115 06/22/16 0445 06/22/16 0932  BP:  136/61 (!) 123/56   Pulse:  75 75   Resp:  18 18   Temp:  97.3 F (36.3 C) 97.4 F (36.3 C)   TempSrc:  Oral Oral   SpO2: 93% 96% 91% 94%  Weight:  Height:        Intake/Output Summary (Last 24 hours) at 06/22/16 1549 Last data filed at 06/22/16 1500  Gross per 24 hour  Intake             1575 ml  Output              800 ml  Net              775 ml   Filed Weights   06/20/16 1411  Weight: 74.4 kg (164 lb)    Examination: General exam: Appears comfortable  HEENT: PERRLA, oral mucosa moist, no sclera icterus or thrush Respiratory system: Crackles in right lower lung fields Respiratory effort normal. Cardiovascular system: S1 & S2 heard, RRR.  No murmurs   Gastrointestinal system: Abdomen soft, non-tender, nondistended. Normal bowel sound. No organomegaly Central nervous system: Alert and oriented. No focal neurological deficits. Extremities: No cyanosis, clubbing or edema Skin: No rashes or ulcers Psychiatry:  Mood & affect appropriate.     Data Reviewed: I have personally reviewed following labs and imaging studies  CBC:  Recent Labs Lab 06/20/16 1430 06/21/16 0213 06/22/16 0705  WBC 19.9* 17.0* 12.6*  NEUTROABS 16.9* 14.3* 10.5*  HGB 10.2* 9.1* 9.7*  HCT 30.6* 28.1* 29.6*  MCV 82.7 82.4 82.2  PLT 535* 487* 547*   Basic Metabolic Panel:  Recent Labs Lab 06/20/16 1430 06/21/16 0130 06/21/16 0213 06/22/16 0705  NA 132*  --  134* 138  K 2.6*  --  3.3* 3.1*  CL 95*  --  98* 104  CO2 27  --  26 23  GLUCOSE 105*  --  107* 89  BUN 21*  --  15 10  CREATININE 0.81  --  0.68 0.63  CALCIUM 8.1*  --  8.0* 8.1*  MG  --  1.8  --  1.8  PHOS  --  2.9  --  2.8   GFR: Estimated Creatinine Clearance: 86.5 mL/min (by C-G formula based on SCr of 0.63 mg/dL). Liver Function Tests:  Recent Labs Lab 06/22/16 0705  AST 95*  ALT 112*  ALKPHOS 95  BILITOT 0.5  PROT 6.0*  ALBUMIN 2.1*   No results for input(s): LIPASE, AMYLASE in the last 168 hours. No results for input(s): AMMONIA in the last 168 hours. Coagulation Profile: No results for input(s): INR, PROTIME in the last 168 hours. Cardiac Enzymes:  Recent Labs Lab 06/20/16 1430  TROPONINI <0.03   BNP (last 3 results) No results for input(s): PROBNP in the last 8760 hours. HbA1C: No results for input(s): HGBA1C in the last 72 hours. CBG: No results for input(s): GLUCAP in the last 168 hours. Lipid Profile: No results for input(s): CHOL, HDL, LDLCALC, TRIG, CHOLHDL, LDLDIRECT in the last 72 hours. Thyroid Function Tests: No results for input(s): TSH, T4TOTAL, FREET4, T3FREE, THYROIDAB in the last 72 hours. Anemia Panel: No results for input(s): VITAMINB12,  FOLATE, FERRITIN, TIBC, IRON, RETICCTPCT in the last 72 hours. Urine analysis: No results found for: COLORURINE, APPEARANCEUR, LABSPEC, PHURINE, GLUCOSEU, HGBUR, BILIRUBINUR, KETONESUR, PROTEINUR, UROBILINOGEN, NITRITE, LEUKOCYTESUR Sepsis Labs: @LABRCNTIP (procalcitonin:4,lacticidven:4) ) Recent Results (from the past 240 hour(s))  MRSA PCR Screening     Status: None   Collection Time: 06/20/16  8:22 PM  Result Value Ref Range Status   MRSA by PCR NEGATIVE NEGATIVE Final    Comment:        The GeneXpert MRSA Assay (FDA approved for NASAL specimens only), is one component of a comprehensive MRSA colonization  surveillance program. It is not intended to diagnose MRSA infection nor to guide or monitor treatment for MRSA infections.   Culture, sputum-assessment     Status: None   Collection Time: 06/20/16 11:10 PM  Result Value Ref Range Status   Specimen Description SPUTUM  Final   Special Requests Normal  Final   Sputum evaluation THIS SPECIMEN IS ACCEPTABLE FOR SPUTUM CULTURE  Final   Report Status 06/21/2016 FINAL  Final  Culture, respiratory (NON-Expectorated)     Status: None (Preliminary result)   Collection Time: 06/20/16 11:10 PM  Result Value Ref Range Status   Specimen Description SPUTUM  Final   Special Requests Normal Reflexed from [id number]  Final   Gram Stain   Final    ABUNDANT WBC PRESENT,BOTH PMN AND MONONUCLEAR FEW GRAM POSITIVE COCCI IN PAIRS RARE GRAM NEGATIVE COCCI IN PAIRS RARE GRAM NEGATIVE RODS RARE YEAST    Culture   Final    CULTURE REINCUBATED FOR BETTER GROWTH Performed at [facility], [address], [place], [place] [address]    Report Status PENDING  Incomplete         Radiology Studies: Ct Chest High Resolution  Result Date: 06/21/2016 CLINICAL DATA:  76 year old male with history of bronchiectasis. Pseudomonas infection. EXAM: CT CHEST WITHOUT CONTRAST TECHNIQUE: Multidetector CT imaging of the chest was performed following the  standard protocol without intravenous contrast. High resolution imaging of the lungs, as well as inspiratory and expiratory imaging, was performed. COMPARISON:  No priors. FINDINGS: Cardiovascular: Heart size is normal. Small amount of pericardial fluid and/or thickening anteriorly, unlikely to be of any hemodynamic significance at this time. No associated pericardial calcification. There is aortic atherosclerosis, as well as atherosclerosis of the great vessels of the mediastinum and the coronary arteries, including calcified atherosclerotic plaque in the left main, left anterior descending, left circumflex and right coronary arteries. Calcifications of the aortic valve. Mediastinum/Nodes: Multiple enlarged mediastinal lymph nodes measuring up to 17 mm in short axis in the low right paratracheal and subcarinal nodal stations. Esophagus is unremarkable in appearance. No axillary lymphadenopathy. Lungs/Pleura: Patchy airspace consolidation noted throughout the lungs bilaterally, peribronchovascular in distribution. This is most confluent in the right upper lobe, particularly posteriorly and near the apex, as well as in the right lower lobe, however, there are additional areas of consolidations scattered randomly throughout the lungs bilaterally. High-resolution images otherwise demonstrate no significant regions of ground-glass attenuation, septal thickening, parenchymal banding, traction bronchiectasis or frank honeycombing. Inspiratory and expiratory imaging is limited by a motion, but there does appear to be a small amount of air trapping, indicative of mild small airways disease. Small right and trace left pleural effusions lie dependently. Upper Abdomen: Aortic atherosclerosis. Musculoskeletal: There are no aggressive appearing lytic or blastic lesions noted in the visualized portions of the skeleton. IMPRESSION: 1. Severe bilateral multilobar bronchopneumonia, most confluent in the right upper lobe. Mediastinal  lymphadenopathy is presumably reactive. 2. Small right and trace left pleural effusions layering dependently. 3. No findings to suggest interstitial lung disease. 4. Aortic atherosclerosis, in addition to left main and 3 vessel coronary artery disease. Assessment for potential risk factor modification, dietary therapy or pharmacologic therapy may be warranted, if clinically indicated. 5. There are calcifications of the aortic valve. Echocardiographic correlation for evaluation of potential valvular dysfunction may be warranted if clinically indicated. Electronically Signed   By: Trudie Reed M.D.   On: 06/21/2016 12:24      Scheduled Meds: . aspirin  81 mg  Oral Daily  . budesonide-formoterol  2 puff Inhalation BID  . cilostazol  100 mg Oral BID  . enoxaparin (LOVENOX) injection  40 mg Subcutaneous Q24H  . feeding supplement  1 Container Oral Q24H  . gabapentin  300 mg Oral QHS  . ipratropium-albuterol  3 mL Nebulization TID  . multivitamin with minerals  1 tablet Oral Daily  . protein supplement shake  11 oz Oral Q24H  . sertraline  200 mg Oral Daily  . traZODone  50 mg Oral QHS  . vitamin B-12  1,000 mcg Oral Daily   Continuous Infusions: . piperacillin-tazobactam (ZOSYN)  IV 3.375 g (06/22/16 1542)  . vancomycin Stopped (06/22/16 1102)     LOS: 2 days    Time spent in minutes: 35    Calvert Cantor, MD [facility] Pager: [email or web address] Password [facility] 06/22/2016, 3:49 PM

## 2016-06-23 ENCOUNTER — Inpatient Hospital Stay (HOSPITAL_COMMUNITY): Payer: Non-veteran care

## 2016-06-23 DIAGNOSIS — J189 Pneumonia, unspecified organism: Secondary | ICD-10-CM

## 2016-06-23 DIAGNOSIS — J440 Chronic obstructive pulmonary disease with acute lower respiratory infection: Secondary | ICD-10-CM

## 2016-06-23 LAB — BASIC METABOLIC PANEL
Anion gap: 10 (ref 5–15)
BUN: 10 mg/dL (ref 6–20)
CO2: 24 mmol/L (ref 22–32)
Calcium: 8.5 mg/dL — ABNORMAL LOW (ref 8.9–10.3)
Chloride: 103 mmol/L (ref 101–111)
Creatinine, Ser: 0.61 mg/dL (ref 0.61–1.24)
Glucose, Bld: 86 mg/dL (ref 65–99)
POTASSIUM: 3.6 mmol/L (ref 3.5–5.1)
Sodium: 137 mmol/L (ref 135–145)

## 2016-06-23 LAB — CBC
HCT: 31.8 % — ABNORMAL LOW (ref 39.0–52.0)
HEMOGLOBIN: 10.2 g/dL — AB (ref 13.0–17.0)
MCH: 26.7 pg (ref 26.0–34.0)
MCHC: 32.1 g/dL (ref 30.0–36.0)
MCV: 83.2 fL (ref 78.0–100.0)
Platelets: 620 10*3/uL — ABNORMAL HIGH (ref 150–400)
RBC: 3.82 MIL/uL — ABNORMAL LOW (ref 4.22–5.81)
RDW: 14.7 % (ref 11.5–15.5)
WBC: 11.9 10*3/uL — AB (ref 4.0–10.5)

## 2016-06-23 LAB — MAGNESIUM: MAGNESIUM: 1.8 mg/dL (ref 1.7–2.4)

## 2016-06-23 LAB — CULTURE, RESPIRATORY
CULTURE: NORMAL
SPECIAL REQUESTS: NORMAL

## 2016-06-23 LAB — CULTURE, RESPIRATORY W GRAM STAIN

## 2016-06-23 LAB — PROCALCITONIN

## 2016-06-23 MED ORDER — BOOST / RESOURCE BREEZE PO LIQD: 1.0000 | Freq: Two times a day (BID) | ORAL | 0 refills | Status: DC

## 2016-06-23 MED ORDER — ALBUTEROL SULFATE (2.5 MG/3ML) 0.083% IN NEBU: 2.5000 mg | INHALATION_SOLUTION | Freq: Four times a day (QID) | RESPIRATORY_TRACT | 12 refills | Status: DC | PRN

## 2016-06-23 MED ORDER — PREMIER PROTEIN SHAKE: 11.0000 [oz_av] | ORAL | 0 refills | Status: DC

## 2016-06-23 MED ORDER — ALBUTEROL SULFATE HFA 108 (90 BASE) MCG/ACT IN AERS: 1.0000 | INHALATION_SPRAY | Freq: Four times a day (QID) | RESPIRATORY_TRACT | 0 refills | Status: DC | PRN

## 2016-06-23 MED ORDER — LEVOFLOXACIN 750 MG PO TABS: 750.0000 mg | ORAL_TABLET | Freq: Every day | ORAL | 0 refills | Status: DC

## 2016-06-23 NOTE — Progress Notes (Signed)
SATURATION QUALIFICATIONS: (This note is used to comply with regulatory documentation for home oxygen)  Patient Saturations on Room Air at Rest = 98%  Patient Saturations on Room Air while Ambulating = 93%  Patient Saturations on 0 Liters of oxygen while Ambulating = 93%  Please briefly explain why patient needs home oxygen: 

## 2016-06-23 NOTE — Discharge Summary (Signed)
Physician Discharge Summary  William Kirk ZOX:[id number] DOB: [birth year] DOA: 06/20/2016  PCP: No primary care provider on file.  Admit date: 06/20/2016 Discharge date: 06/23/2016  Admitted From: home Disposition:  home   Recommendations for Outpatient Follow-up:  1. f/u on weight loss 2. [facility]- extend course of antibiotics as needed- if he fails Levaquin, may need to consider IV antibiotics as he has improved with Zosyn and Vancomycin    Home Health:  none  Equipment/Devices:  none    Discharge Condition:  stable   CODE STATUS:  Full code   Consultations:  pulmonary    Discharge Diagnoses:  Principal Problem:   Multifocal pneumonia Active Problems:   Bronchiectasis (HCC)   COPD (chronic obstructive pulmonary disease) (HCC)   Acute respiratory failure with hypoxia (HCC)   Hypokalemia   Hyponatremia   Thrombocytosis (HCC)   PVD (peripheral vascular disease) (HCC)   Malnutrition of moderate degree    Subjective: Cough improving. No dyspnea at rest or with exertion. No chest pain, nausea, vomiting or diarrhea. Oral intake improving. Wants to go home.   Brief Summary: 76 year old male with a history of COPD and chronic bronchiectasis. He has been maintained on Zithromax 3 times a day for this past year due to persistent fevers and cough. The patient recently had a course of ciprofloxacin for 6 days due to recurrence of fevers and cough spite taking Zithromax. Subsequent to this course he developed fevers and chills and was placed on a six-day course of levofloxacin. About a week later he developed symptoms once again and this time was placed on ciprofloxacin which he took for 1 week. Fevers did not resolve and cough became worse and therefore he presented to the hospital.  Hospital Course:  Principal Problem:   Multifocal pneumonia/  Acute respiratory failure with hypoxia    Sepsis -   b/l pneumonia- see CT chest below - he has had 3  course of Cipro alternating with Levaquin as outpt which were 6 days each with about a week gap in between each course-- suspect he had incomplete treatment and at this time with received a total of 14 days- -Previously has grown out Pseudomonas from his sputum which was sensitive to ciprofloxacin-however he has failed ciprofloxacin prior to presenting to the hospital -He is currently receiving vancomycin and Zosyn and has improved significantly- we have been able to wean him off of O2 (was on 4 L) and WBC count has improved from 19.9 to 11.9 - sputum culture consistent with normal flora -appreciate pulmonary's assistance-- he is anxious to be discharged and has been asking since yesterday- pulmonary is recommending a 10 day course of Levaquin and [facility]    Active Problems:   COPD (chronic obstructive pulmonary disease)  -No exacerbation -Has quit smoking-does not use oxygen at baseline-follows with pulmonary as outpatient - have added nebulizer treatments to help with airway reactivity in setting of multifocal pneumonia    Hypokalemia   Hyponatremia - adequately replaced    PVD (peripheral vascular disease) with history of PTCA and right lower extremity -Aspirin and Pletal    Malnutrition of moderate degree - has been losing weight and has had a poor appetite Continue supplements as outpt    Thrombocytosis (HCC) - likely elevated as acute phase reactant in setting of infection- no prior blood work to compare with to see he baseline  Discharge Instructions  Discharge Instructions  DME Nebulizer machine    Complete by:  As directed    Patient needs a nebulizer to treat with the following condition:  COPD with acute lower respiratory infection (HCC)   Diet - low sodium heart healthy    Complete by:  As directed    Increase activity slowly    Complete by:  As directed      Allergies as of 06/23/2016   No Known Allergies     Medication List     TAKE these medications   acetaminophen 325 MG tablet Commonly known as:  TYLENOL Take 650 mg by mouth every 6 (six) hours as needed for mild pain or moderate pain.   albuterol 108 (90 Base) MCG/ACT inhaler Commonly known as:  PROVENTIL HFA;VENTOLIN HFA Inhale 1-2 puffs into the lungs every 6 (six) hours as needed for wheezing or shortness of breath. What changed:  Another medication with the same name was added. Make sure you understand how and when to take each.   albuterol (2.5 MG/3ML) 0.083% nebulizer solution Commonly known as:  PROVENTIL Take 3 mLs (2.5 mg total) by nebulization every 6 (six) hours as needed for wheezing or shortness of breath. What changed:  You were already taking a medication with the same name, and this prescription was added. Make sure you understand how and when to take each.   aspirin 81 MG tablet Take 81 mg by mouth daily.   cilostazol 100 MG tablet Commonly known as:  PLETAL Take 100 mg by mouth 2 (two) times daily.   feeding supplement Liqd Take 1 Container by mouth 2 (two) times daily between meals.   gabapentin 300 MG capsule Commonly known as:  NEURONTIN Take 300 mg by mouth at bedtime.   HYDROcodone-acetaminophen 10-325 MG tablet Commonly known as:  NORCO Take 1-2 tablets by mouth every 8 (eight) hours as needed.   levofloxacin 750 MG tablet Commonly known as:  LEVAQUIN Take 1 tablet (750 mg total) by mouth daily.   multivitamin with minerals tablet Take 1 tablet by mouth daily.   protein supplement shake Liqd Commonly known as:  PREMIER PROTEIN Take 325 mLs (11 oz total) by mouth daily.   sertraline 100 MG tablet Commonly known as:  ZOLOFT Take 200 mg by mouth daily.   SYMBICORT IN Inhale 2 puffs into the lungs 2 (two) times daily.   testosterone cypionate 100 MG/ML injection Commonly known as:  DEPOTESTOTERONE CYPIONATE Inject 200 mg into the muscle every 28 (twenty-eight) days.   tiotropium 18 MCG inhalation  capsule Commonly known as:  SPIRIVA Place 18 mcg into inhaler and inhale 2 (two) times daily.   traZODone 50 MG tablet Commonly known as:  DESYREL Take 50 mg by mouth at bedtime.   vitamin B-12 1000 MCG tablet Commonly known as:  CYANOCOBALAMIN Take 1,000 mcg by mouth daily.   VITAMIN C & D3/ROSE HIPS PO Take 1 capsule by mouth daily.            Durable Medical Equipment        Start     Ordered   06/23/16 0000  DME Nebulizer machine    Question:  Patient needs a nebulizer to treat with the following condition  Answer:  COPD with acute lower respiratory infection (HCC)   06/23/16 1141      No Known Allergies   Procedures/Studies:    Dg Chest 2 View  Result Date: 06/23/2016 CLINICAL DATA:  Follow-up of right-sided pneumonia. History of COPD, former smoker. EXAM:  CHEST  2 VIEW COMPARISON:  Chest x-ray of Jun 20, 2016 and chest CT scan of Jun 21, 2016. FINDINGS: There are persistent patchy interstitial and alveolar opacities in the right lung with minimal increased density peripherally in the left mid lung consistent with known pneumonia. There is hilar prominence bilaterally consistent with known lymphadenopathy. There is calcification in the wall of the aortic arch. The heart and pulmonary vascularity are normal. There is a small amount of pleural fluid blunting the costophrenic angles on the right in the posterior costophrenic angle on the left. IMPRESSION: COPD. Persistent widespread interstitial and alveolar infiltrates in the right lung with minimal similar change peripherally in the left mid lung consistent with pneumonia. Small right pleural effusion. Thoracic aortic atherosclerosis. Electronically Signed   By: David  [place] M.D.   On: 06/23/2016 11:11   Dg Chest 2 View  Result Date: 06/20/2016 CLINICAL DATA:  Diagnosed with pneumonia Jun 13, 2016. Shortness of breath. History of COPD. EXAM: CHEST  2 VIEW COMPARISON:  Chest radiograph February 21, 2009 FINDINGS:  Interstitial and alveolar airspace opacities throughout the RIGHT greater than LEFT lung with nodular appearance. No pleural effusion. Cardiomediastinal silhouette is normal, mildly calcified aortic knob. Increased lung volumes with flattened hemidiaphragms. Patient rotated to the RIGHT. No pneumothorax. Soft tissue planes and included osseous structures are nonsuspicious. IMPRESSION: Interstitial and alveolar airspace opacities compatible multifocal pneumonia, metastatic disease less likely. Followup PA and lateral chest X-ray is recommended in 3-4 weeks following trial of antibiotic therapy to ensure resolution and exclude underlying malignancy. COPD. Electronically Signed   By: Awilda Metro M.D.   On: 06/20/2016 15:00   Ct Chest High Resolution  Result Date: 06/21/2016 CLINICAL DATA:  76 year old male with history of bronchiectasis. Pseudomonas infection. EXAM: CT CHEST WITHOUT CONTRAST TECHNIQUE: Multidetector CT imaging of the chest was performed following the standard protocol without intravenous contrast. High resolution imaging of the lungs, as well as inspiratory and expiratory imaging, was performed. COMPARISON:  No priors. FINDINGS: Cardiovascular: Heart size is normal. Small amount of pericardial fluid and/or thickening anteriorly, unlikely to be of any hemodynamic significance at this time. No associated pericardial calcification. There is aortic atherosclerosis, as well as atherosclerosis of the great vessels of the mediastinum and the coronary arteries, including calcified atherosclerotic plaque in the left main, left anterior descending, left circumflex and right coronary arteries. Calcifications of the aortic valve. Mediastinum/Nodes: Multiple enlarged mediastinal lymph nodes measuring up to 17 mm in short axis in the low right paratracheal and subcarinal nodal stations. Esophagus is unremarkable in appearance. No axillary lymphadenopathy. Lungs/Pleura: Patchy airspace consolidation noted  throughout the lungs bilaterally, peribronchovascular in distribution. This is most confluent in the right upper lobe, particularly posteriorly and near the apex, as well as in the right lower lobe, however, there are additional areas of consolidations scattered randomly throughout the lungs bilaterally. High-resolution images otherwise demonstrate no significant regions of ground-glass attenuation, septal thickening, parenchymal banding, traction bronchiectasis or frank honeycombing. Inspiratory and expiratory imaging is limited by a motion, but there does appear to be a small amount of air trapping, indicative of mild small airways disease. Small right and trace left pleural effusions lie dependently. Upper Abdomen: Aortic atherosclerosis. Musculoskeletal: There are no aggressive appearing lytic or blastic lesions noted in the visualized portions of the skeleton. IMPRESSION: 1. Severe bilateral multilobar bronchopneumonia, most confluent in the right upper lobe. Mediastinal lymphadenopathy is presumably reactive. 2. Small right and trace left pleural effusions layering dependently. 3. No findings to suggest  interstitial lung disease. 4. Aortic atherosclerosis, in addition to left main and 3 vessel coronary artery disease. Assessment for potential risk factor modification, dietary therapy or pharmacologic therapy may be warranted, if clinically indicated. 5. There are calcifications of the aortic valve. Echocardiographic correlation for evaluation of potential valvular dysfunction may be warranted if clinically indicated. Electronically Signed   By: Trudie Reed M.D.   On: 06/21/2016 12:24       Discharge Exam: Vitals:   06/22/16 2323 06/23/16 0456  BP: 124/60 (!) 152/70  Pulse: 68 64  Resp: 16 18  Temp: 98.4 F (36.9 C) 97.5 F (36.4 C)   Vitals:   06/22/16 1845 06/22/16 2323 06/23/16 0456 06/23/16 0628  BP:  124/60 (!) 152/70   Pulse:  68 64   Resp:  16 18   Temp:  98.4 F (36.9 C) 97.5  F (36.4 C)   TempSrc:  Oral Oral   SpO2: 94% 92% 93% 93%  Weight:      Height:        General: Pt is alert, awake, not in acute distress Cardiovascular: RRR, S1/S2 +, no rubs, no gallops Respiratory: CTA bilaterally, no wheezing, no rhonchi Abdominal: Soft, NT, ND, bowel sounds + Extremities: no edema, no cyanosis    The results of significant diagnostics from this hospitalization (including imaging, microbiology, ancillary and laboratory) are listed below for reference.     Microbiology: Recent Results (from the past 240 hour(s))  MRSA PCR Screening     Status: None   Collection Time: 06/20/16  8:22 PM  Result Value Ref Range Status   MRSA by PCR NEGATIVE NEGATIVE Final    Comment:        The GeneXpert MRSA Assay (FDA approved for NASAL specimens only), is one component of a comprehensive MRSA colonization surveillance program. It is not intended to diagnose MRSA infection nor to guide or monitor treatment for MRSA infections.   Culture, blood (routine x 2) Call MD if unable to obtain prior to antibiotics being given     Status: None (Preliminary result)   Collection Time: 06/20/16 11:08 PM  Result Value Ref Range Status   Specimen Description BLOOD LEFT ANTECUBITAL  Final   Special Requests   Final    BOTTLES DRAWN AEROBIC AND ANAEROBIC Blood Culture adequate volume   Culture   Final    NO GROWTH 2 DAYS Performed at [facility], [address] [place], [place] [address]    Report Status PENDING  Incomplete  Culture, blood (routine x 2) Call MD if unable to obtain prior to antibiotics being given     Status: None (Preliminary result)   Collection Time: 06/20/16 11:08 PM  Result Value Ref Range Status   Specimen Description BLOOD LEFT ARM  Final   Special Requests   Final    BOTTLES DRAWN AEROBIC AND ANAEROBIC Blood Culture adequate volume   Culture   Final    NO GROWTH 2 DAYS Performed at [facility], [address] [place], [place] [address]     Report Status PENDING  Incomplete  Culture, sputum-assessment     Status: None   Collection Time: 06/20/16 11:10 PM  Result Value Ref Range Status   Specimen Description SPUTUM  Final   Special Requests Normal  Final   Sputum evaluation THIS SPECIMEN IS ACCEPTABLE FOR SPUTUM CULTURE  Final   Report Status 06/21/2016 FINAL  Final  Culture, respiratory (NON-Expectorated)     Status: None  Collection Time: 06/20/16 11:10 PM  Result Value Ref Range Status   Specimen Description SPUTUM  Final   Special Requests Normal Reflexed from [id number]  Final   Gram Stain   Final    ABUNDANT WBC PRESENT,BOTH PMN AND MONONUCLEAR FEW GRAM POSITIVE COCCI IN PAIRS RARE GRAM NEGATIVE COCCI IN PAIRS RARE GRAM NEGATIVE RODS RARE YEAST    Culture   Final    Consistent with normal respiratory flora. Performed at [facility], [address] [place], [place] [address]    Report Status 06/23/2016 FINAL  Final     Labs: BNP (last 3 results)  Recent Labs  06/20/16 1430  BNP 187.3*   Basic Metabolic Panel:  Recent Labs Lab 06/20/16 1430 06/21/16 0130 06/21/16 0213 06/22/16 0705 06/23/16 0608  NA 132*  --  134* 138 137  K 2.6*  --  3.3* 3.1* 3.6  CL 95*  --  98* 104 103  CO2 27  --  26 23 24   GLUCOSE 105*  --  107* 89 86  BUN 21*  --  15 10 10   CREATININE 0.81  --  0.68 0.63 0.61  CALCIUM 8.1*  --  8.0* 8.1* 8.5*  MG  --  1.8  --  1.8 1.8  PHOS  --  2.9  --  2.8  --    Liver Function Tests:  Recent Labs Lab 06/22/16 0705  AST 95*  ALT 112*  ALKPHOS 95  BILITOT 0.5  PROT 6.0*  ALBUMIN 2.1*   No results for input(s): LIPASE, AMYLASE in the last 168 hours. No results for input(s): AMMONIA in the last 168 hours. CBC:  Recent Labs Lab 06/20/16 1430 06/21/16 0213 06/22/16 0705 06/23/16 0608  WBC 19.9* 17.0* 12.6* 11.9*  NEUTROABS 16.9* 14.3* 10.5*  --   HGB 10.2* 9.1* 9.7* 10.2*  HCT 30.6* 28.1* 29.6* 31.8*  MCV 82.7 82.4 82.2 83.2  PLT 535* 487* 547* 620*    Cardiac Enzymes:  Recent Labs Lab 06/20/16 1430  TROPONINI <0.03   BNP: Invalid input(s): POCBNP CBG: No results for input(s): GLUCAP in the last 168 hours. D-Dimer No results for input(s): DDIMER in the last 72 hours. Hgb A1c No results for input(s): HGBA1C in the last 72 hours. Lipid Profile No results for input(s): CHOL, HDL, LDLCALC, TRIG, CHOLHDL, LDLDIRECT in the last 72 hours. Thyroid function studies No results for input(s): TSH, T4TOTAL, T3FREE, THYROIDAB in the last 72 hours.  Invalid input(s): FREET3 Anemia work up No results for input(s): VITAMINB12, FOLATE, FERRITIN, TIBC, IRON, RETICCTPCT in the last 72 hours. Urinalysis No results found for: COLORURINE, APPEARANCEUR, LABSPEC, PHURINE, GLUCOSEU, HGBUR, BILIRUBINUR, KETONESUR, PROTEINUR, UROBILINOGEN, NITRITE, LEUKOCYTESUR Sepsis Labs Invalid input(s): PROCALCITONIN,  WBC,  LACTICIDVEN Microbiology Recent Results (from the past 240 hour(s))  MRSA PCR Screening     Status: None   Collection Time: 06/20/16  8:22 PM  Result Value Ref Range Status   MRSA by PCR NEGATIVE NEGATIVE Final    Comment:        The GeneXpert MRSA Assay (FDA approved for NASAL specimens only), is one component of a comprehensive MRSA colonization surveillance program. It is not intended to diagnose MRSA infection nor to guide or monitor treatment for MRSA infections.   Culture, blood (routine x 2) Call MD if unable to obtain prior to antibiotics being given     Status: None (Preliminary result)   Collection Time: 06/20/16 11:08 PM  Result Value Ref Range Status   Specimen  Description BLOOD LEFT ANTECUBITAL  Final   Special Requests   Final    BOTTLES DRAWN AEROBIC AND ANAEROBIC Blood Culture adequate volume   Culture   Final    NO GROWTH 2 DAYS Performed at [facility], [address] [place], [place] [address]    Report Status PENDING  Incomplete  Culture, blood (routine x 2) Call MD if unable to obtain prior to  antibiotics being given     Status: None (Preliminary result)   Collection Time: 06/20/16 11:08 PM  Result Value Ref Range Status   Specimen Description BLOOD LEFT ARM  Final   Special Requests   Final    BOTTLES DRAWN AEROBIC AND ANAEROBIC Blood Culture adequate volume   Culture   Final    NO GROWTH 2 DAYS Performed at [facility], [address] [place], [place] [address]    Report Status PENDING  Incomplete  Culture, sputum-assessment     Status: None   Collection Time: 06/20/16 11:10 PM  Result Value Ref Range Status   Specimen Description SPUTUM  Final   Special Requests Normal  Final   Sputum evaluation THIS SPECIMEN IS ACCEPTABLE FOR SPUTUM CULTURE  Final   Report Status 06/21/2016 FINAL  Final  Culture, respiratory (NON-Expectorated)     Status: None   Collection Time: 06/20/16 11:10 PM  Result Value Ref Range Status   Specimen Description SPUTUM  Final   Special Requests Normal Reflexed from [id number]  Final   Gram Stain   Final    ABUNDANT WBC PRESENT,BOTH PMN AND MONONUCLEAR FEW GRAM POSITIVE COCCI IN PAIRS RARE GRAM NEGATIVE COCCI IN PAIRS RARE GRAM NEGATIVE RODS RARE YEAST    Culture   Final    Consistent with normal respiratory flora. Performed at [facility], [address] [place], [place] [address]    Report Status 06/23/2016 FINAL  Final     Time coordinating discharge: Over 30 minutes  SIGNED:   Calvert Cantor, MD  [facility] 06/23/2016, 11:57 AM Pager   If 7PM-7AM, please contact night-coverage [email or web address] Password [email or web address]

## 2016-06-26 LAB — CULTURE, BLOOD (ROUTINE X 2)
Culture: NO GROWTH
Culture: NO GROWTH
SPECIAL REQUESTS: ADEQUATE
SPECIAL REQUESTS: ADEQUATE

## 2016-09-21 ENCOUNTER — Inpatient Hospital Stay (HOSPITAL_BASED_OUTPATIENT_CLINIC_OR_DEPARTMENT_OTHER)
Admission: EM | Admit: 2016-09-21 | Discharge: 2016-09-26 | DRG: 871 | Disposition: A | Discharge status: Home Health | Payer: Non-veteran care | Attending: Internal Medicine | Admitting: Internal Medicine

## 2016-09-21 ENCOUNTER — Encounter (HOSPITAL_BASED_OUTPATIENT_CLINIC_OR_DEPARTMENT_OTHER): Payer: Self-pay | Admitting: Emergency Medicine

## 2016-09-21 ENCOUNTER — Emergency Department (HOSPITAL_BASED_OUTPATIENT_CLINIC_OR_DEPARTMENT_OTHER): Payer: Non-veteran care

## 2016-09-21 DIAGNOSIS — J690 Pneumonitis due to inhalation of food and vomit: Secondary | ICD-10-CM | POA: Diagnosis not present

## 2016-09-21 DIAGNOSIS — E43 Unspecified severe protein-calorie malnutrition: Secondary | ICD-10-CM

## 2016-09-21 DIAGNOSIS — R918 Other nonspecific abnormal finding of lung field: Secondary | ICD-10-CM | POA: Diagnosis not present

## 2016-09-21 DIAGNOSIS — I340 Nonrheumatic mitral (valve) insufficiency: Secondary | ICD-10-CM | POA: Diagnosis not present

## 2016-09-21 DIAGNOSIS — F419 Anxiety disorder, unspecified: Secondary | ICD-10-CM | POA: Diagnosis present

## 2016-09-21 DIAGNOSIS — Z6820 Body mass index (BMI) 20.0-20.9, adult: Secondary | ICD-10-CM

## 2016-09-21 DIAGNOSIS — E876 Hypokalemia: Secondary | ICD-10-CM | POA: Diagnosis not present

## 2016-09-21 DIAGNOSIS — J189 Pneumonia, unspecified organism: Secondary | ICD-10-CM | POA: Diagnosis not present

## 2016-09-21 DIAGNOSIS — Z96641 Presence of right artificial hip joint: Secondary | ICD-10-CM | POA: Diagnosis present

## 2016-09-21 DIAGNOSIS — I739 Peripheral vascular disease, unspecified: Secondary | ICD-10-CM | POA: Diagnosis present

## 2016-09-21 DIAGNOSIS — R1310 Dysphagia, unspecified: Secondary | ICD-10-CM | POA: Diagnosis not present

## 2016-09-21 DIAGNOSIS — Z8701 Personal history of pneumonia (recurrent): Secondary | ICD-10-CM | POA: Diagnosis not present

## 2016-09-21 DIAGNOSIS — F329 Major depressive disorder, single episode, unspecified: Secondary | ICD-10-CM | POA: Diagnosis present

## 2016-09-21 DIAGNOSIS — A419 Sepsis, unspecified organism: Principal | ICD-10-CM | POA: Diagnosis present

## 2016-09-21 DIAGNOSIS — Z87891 Personal history of nicotine dependence: Secondary | ICD-10-CM

## 2016-09-21 DIAGNOSIS — R0602 Shortness of breath: Secondary | ICD-10-CM | POA: Diagnosis not present

## 2016-09-21 DIAGNOSIS — Z7951 Long term (current) use of inhaled steroids: Secondary | ICD-10-CM | POA: Diagnosis not present

## 2016-09-21 DIAGNOSIS — Z7982 Long term (current) use of aspirin: Secondary | ICD-10-CM | POA: Diagnosis not present

## 2016-09-21 DIAGNOSIS — I10 Essential (primary) hypertension: Secondary | ICD-10-CM | POA: Diagnosis not present

## 2016-09-21 DIAGNOSIS — K5792 Diverticulitis of intestine, part unspecified, without perforation or abscess without bleeding: Secondary | ICD-10-CM | POA: Diagnosis not present

## 2016-09-21 DIAGNOSIS — R05 Cough: Secondary | ICD-10-CM | POA: Diagnosis not present

## 2016-09-21 DIAGNOSIS — J9601 Acute respiratory failure with hypoxia: Secondary | ICD-10-CM | POA: Diagnosis not present

## 2016-09-21 DIAGNOSIS — J441 Chronic obstructive pulmonary disease with (acute) exacerbation: Secondary | ICD-10-CM | POA: Diagnosis present

## 2016-09-21 DIAGNOSIS — Y95 Nosocomial condition: Secondary | ICD-10-CM | POA: Diagnosis present

## 2016-09-21 DIAGNOSIS — Z79899 Other long term (current) drug therapy: Secondary | ICD-10-CM | POA: Diagnosis not present

## 2016-09-21 DIAGNOSIS — C760 Malignant neoplasm of head, face and neck: Secondary | ICD-10-CM | POA: Diagnosis not present

## 2016-09-21 DIAGNOSIS — R0902 Hypoxemia: Secondary | ICD-10-CM

## 2016-09-21 DIAGNOSIS — F324 Major depressive disorder, single episode, in partial remission: Secondary | ICD-10-CM | POA: Diagnosis not present

## 2016-09-21 DIAGNOSIS — F32A Depression, unspecified: Secondary | ICD-10-CM | POA: Diagnosis present

## 2016-09-21 HISTORY — DX: Chronic or unspecified gastric ulcer with hemorrhage: K25.4

## 2016-09-21 HISTORY — DX: Depression, unspecified: F32.A

## 2016-09-21 HISTORY — DX: Pneumonia, unspecified organism: J18.9

## 2016-09-21 HISTORY — DX: Major depressive disorder, single episode, unspecified: F32.9

## 2016-09-21 LAB — CBC WITH DIFFERENTIAL/PLATELET
BASOS PCT: 0 %
Basophils Absolute: 0 10*3/uL (ref 0.0–0.1)
EOS PCT: 0 %
Eosinophils Absolute: 0 10*3/uL (ref 0.0–0.7)
HCT: 33.5 % — ABNORMAL LOW (ref 39.0–52.0)
Hemoglobin: 10.5 g/dL — ABNORMAL LOW (ref 13.0–17.0)
LYMPHS ABS: 1.4 10*3/uL (ref 0.7–4.0)
Lymphocytes Relative: 7 %
MCH: 24 pg — AB (ref 26.0–34.0)
MCHC: 31.3 g/dL (ref 30.0–36.0)
MCV: 76.5 fL — AB (ref 78.0–100.0)
Monocytes Absolute: 1.2 10*3/uL — ABNORMAL HIGH (ref 0.1–1.0)
Monocytes Relative: 6 %
NEUTROS ABS: 16.7 10*3/uL — AB (ref 1.7–7.7)
Neutrophils Relative %: 87 %
Platelets: 551 10*3/uL — ABNORMAL HIGH (ref 150–400)
RBC: 4.38 MIL/uL (ref 4.22–5.81)
RDW: 16.6 % — AB (ref 11.5–15.5)
WBC: 19.3 10*3/uL — ABNORMAL HIGH (ref 4.0–10.5)

## 2016-09-21 LAB — BLOOD GAS, ARTERIAL
ACID-BASE EXCESS: 3.6 mmol/L — AB (ref 0.0–2.0)
Bicarbonate: 27.4 mmol/L (ref 20.0–28.0)
DRAWN BY: 414221
O2 Content: 10 L/min
O2 Saturation: 91.2 %
PCO2 ART: 39.7 mmHg (ref 32.0–48.0)
PH ART: 7.453 — AB (ref 7.350–7.450)
Patient temperature: 98.6
pO2, Arterial: 61.8 mmHg — ABNORMAL LOW (ref 83.0–108.0)

## 2016-09-21 LAB — COMPREHENSIVE METABOLIC PANEL
ALT: 112 U/L — AB (ref 17–63)
ANION GAP: 11 (ref 5–15)
AST: 73 U/L — ABNORMAL HIGH (ref 15–41)
Albumin: 2.1 g/dL — ABNORMAL LOW (ref 3.5–5.0)
Alkaline Phosphatase: 133 U/L — ABNORMAL HIGH (ref 38–126)
BUN: 26 mg/dL — ABNORMAL HIGH (ref 6–20)
CHLORIDE: 95 mmol/L — AB (ref 101–111)
CO2: 30 mmol/L (ref 22–32)
Calcium: 8.6 mg/dL — ABNORMAL LOW (ref 8.9–10.3)
Creatinine, Ser: 0.92 mg/dL (ref 0.61–1.24)
Glucose, Bld: 135 mg/dL — ABNORMAL HIGH (ref 65–99)
POTASSIUM: 2.7 mmol/L — AB (ref 3.5–5.1)
Sodium: 136 mmol/L (ref 135–145)
Total Bilirubin: 0.5 mg/dL (ref 0.3–1.2)
Total Protein: 7.7 g/dL (ref 6.5–8.1)

## 2016-09-21 LAB — PROTIME-INR
INR: 1.3
Prothrombin Time: 16.3 seconds — ABNORMAL HIGH (ref 11.4–15.2)

## 2016-09-21 LAB — LACTATE DEHYDROGENASE: LDH: 120 U/L (ref 98–192)

## 2016-09-21 LAB — STREP PNEUMONIAE URINARY ANTIGEN: Strep Pneumo Urinary Antigen: NEGATIVE

## 2016-09-21 LAB — I-STAT CG4 LACTIC ACID, ED: LACTIC ACID, VENOUS: 1.63 mmol/L (ref 0.5–1.9)

## 2016-09-21 LAB — LACTIC ACID, PLASMA: LACTIC ACID, VENOUS: 0.8 mmol/L (ref 0.5–1.9)

## 2016-09-21 LAB — PROCALCITONIN: PROCALCITONIN: 0.36 ng/mL

## 2016-09-21 MED ORDER — DOXYCYCLINE HYCLATE 100 MG IV SOLR
100.0000 mg | Freq: Two times a day (BID) | INTRAVENOUS | Status: DC
  Administered 2016-09-22 (×2): 100 mg via INTRAVENOUS
  Filled 2016-09-21 (×2): qty 100

## 2016-09-21 MED ORDER — IOPAMIDOL (ISOVUE-370) INJECTION 76%
100.0000 mL | Freq: Once | INTRAVENOUS | Status: AC | PRN
  Administered 2016-09-21: 100 mL via INTRAVENOUS

## 2016-09-21 MED ORDER — VITAMIN B-12 1000 MCG PO TABS
1000.0000 ug | ORAL_TABLET | Freq: Every day | ORAL | Status: DC
  Administered 2016-09-22 – 2016-09-26 (×5): 1000 ug via ORAL
  Filled 2016-09-21 (×5): qty 1

## 2016-09-21 MED ORDER — ONDANSETRON HCL 4 MG/2ML IJ SOLN: 4.0000 mg | Freq: Three times a day (TID) | INTRAMUSCULAR | Status: DC | PRN

## 2016-09-21 MED ORDER — TRAZODONE HCL 50 MG PO TABS
50.0000 mg | ORAL_TABLET | Freq: Every day | ORAL | Status: DC
  Administered 2016-09-22 – 2016-09-25 (×5): 50 mg via ORAL
  Filled 2016-09-21 (×5): qty 1

## 2016-09-21 MED ORDER — HYDROCODONE-ACETAMINOPHEN 10-325 MG PO TABS: 1.0000 | ORAL_TABLET | Freq: Three times a day (TID) | ORAL | Status: DC | PRN

## 2016-09-21 MED ORDER — CILOSTAZOL 100 MG PO TABS
100.0000 mg | ORAL_TABLET | Freq: Two times a day (BID) | ORAL | Status: DC
  Administered 2016-09-22 – 2016-09-26 (×10): 100 mg via ORAL
  Filled 2016-09-21 (×13): qty 1

## 2016-09-21 MED ORDER — SERTRALINE HCL 100 MG PO TABS
200.0000 mg | ORAL_TABLET | Freq: Every day | ORAL | Status: DC
  Administered 2016-09-22 – 2016-09-26 (×5): 200 mg via ORAL
  Filled 2016-09-21 (×5): qty 2

## 2016-09-21 MED ORDER — GABAPENTIN 300 MG PO CAPS
300.0000 mg | ORAL_CAPSULE | Freq: Every day | ORAL | Status: DC
  Administered 2016-09-22 – 2016-09-25 (×5): 300 mg via ORAL
  Filled 2016-09-21 (×5): qty 1

## 2016-09-21 MED ORDER — POTASSIUM CHLORIDE 20 MEQ/15ML (10%) PO SOLN
40.0000 meq | Freq: Once | ORAL | Status: AC
  Administered 2016-09-21: 40 meq via ORAL
  Filled 2016-09-21: qty 30

## 2016-09-21 MED ORDER — PIPERACILLIN-TAZOBACTAM 3.375 G IVPB 30 MIN
3.3750 g | Freq: Once | INTRAVENOUS | Status: AC
Start: 2016-09-21 — End: 2016-09-21
  Administered 2016-09-21: 3.375 g via INTRAVENOUS
  Filled 2016-09-21 (×2): qty 50

## 2016-09-21 MED ORDER — ALBUTEROL SULFATE (2.5 MG/3ML) 0.083% IN NEBU: 2.5000 mg | INHALATION_SOLUTION | RESPIRATORY_TRACT | Status: DC | PRN

## 2016-09-21 MED ORDER — GUAIFENESIN 100 MG/5ML PO SYRP
200.0000 mg | ORAL_SOLUTION | ORAL | Status: DC | PRN
  Filled 2016-09-21: qty 10

## 2016-09-21 MED ORDER — HYDRALAZINE HCL 20 MG/ML IJ SOLN: 5.0000 mg | INTRAMUSCULAR | Status: DC | PRN

## 2016-09-21 MED ORDER — METHYLPREDNISOLONE SODIUM SUCC 125 MG IJ SOLR
60.0000 mg | Freq: Two times a day (BID) | INTRAMUSCULAR | Status: DC
  Administered 2016-09-21 – 2016-09-22 (×2): 60 mg via INTRAVENOUS
  Filled 2016-09-21 (×2): qty 2

## 2016-09-21 MED ORDER — POTASSIUM CHLORIDE 10 MEQ/100ML IV SOLN
10.0000 meq | INTRAVENOUS | Status: AC
  Administered 2016-09-21 (×3): 10 meq via INTRAVENOUS
  Filled 2016-09-21 (×3): qty 100

## 2016-09-21 MED ORDER — ZOLPIDEM TARTRATE 5 MG PO TABS
5.0000 mg | ORAL_TABLET | Freq: Every evening | ORAL | Status: DC | PRN
  Administered 2016-09-23 – 2016-09-24 (×2): 5 mg via ORAL
  Filled 2016-09-21 (×2): qty 1

## 2016-09-21 MED ORDER — SODIUM CHLORIDE 0.9 % IV SOLN
INTRAVENOUS | Status: DC
  Administered 2016-09-21 – 2016-09-23 (×2): via INTRAVENOUS

## 2016-09-21 MED ORDER — SODIUM CHLORIDE 0.9 % IV BOLUS (SEPSIS)
2000.0000 mL | Freq: Once | INTRAVENOUS | Status: AC
  Administered 2016-09-21: 2000 mL via INTRAVENOUS

## 2016-09-21 MED ORDER — ACETAMINOPHEN 325 MG PO TABS: 650.0000 mg | ORAL_TABLET | Freq: Four times a day (QID) | ORAL | Status: DC | PRN

## 2016-09-21 MED ORDER — IPRATROPIUM-ALBUTEROL 0.5-2.5 (3) MG/3ML IN SOLN
3.0000 mL | RESPIRATORY_TRACT | Status: DC
  Administered 2016-09-21: 3 mL via RESPIRATORY_TRACT
  Filled 2016-09-21: qty 3

## 2016-09-21 MED ORDER — PIPERACILLIN-TAZOBACTAM 3.375 G IVPB
3.3750 g | Freq: Three times a day (TID) | INTRAVENOUS | Status: DC
  Administered 2016-09-22 – 2016-09-25 (×13): 3.375 g via INTRAVENOUS
  Filled 2016-09-21 (×19): qty 50

## 2016-09-21 MED ORDER — ASPIRIN EC 81 MG PO TBEC
81.0000 mg | DELAYED_RELEASE_TABLET | Freq: Every day | ORAL | Status: DC
  Administered 2016-09-22 – 2016-09-26 (×5): 81 mg via ORAL
  Filled 2016-09-21 (×5): qty 1

## 2016-09-21 MED ORDER — IPRATROPIUM-ALBUTEROL 0.5-2.5 (3) MG/3ML IN SOLN
3.0000 mL | Freq: Four times a day (QID) | RESPIRATORY_TRACT | Status: DC
  Administered 2016-09-22 (×2): 3 mL via RESPIRATORY_TRACT
  Filled 2016-09-21 (×2): qty 3

## 2016-09-21 MED ORDER — VANCOMYCIN HCL IN DEXTROSE 1-5 GM/200ML-% IV SOLN
1000.0000 mg | Freq: Once | INTRAVENOUS | Status: AC
  Administered 2016-09-21: 1000 mg via INTRAVENOUS
  Filled 2016-09-21: qty 200

## 2016-09-21 MED ORDER — DEXTROSE 5 % IV SOLN
500.0000 mg | INTRAVENOUS | Status: DC
  Filled 2016-09-21: qty 500

## 2016-09-21 MED ORDER — MAGNESIUM SULFATE IN D5W 1-5 GM/100ML-% IV SOLN
1.0000 g | Freq: Once | INTRAVENOUS | Status: AC
  Administered 2016-09-22: 1 g via INTRAVENOUS
  Filled 2016-09-21: qty 100

## 2016-09-21 MED ORDER — VANCOMYCIN HCL IN DEXTROSE 750-5 MG/150ML-% IV SOLN
750.0000 mg | Freq: Two times a day (BID) | INTRAVENOUS | Status: DC
  Administered 2016-09-22: 750 mg via INTRAVENOUS
  Filled 2016-09-21 (×3): qty 150

## 2016-09-21 MED ORDER — VANCOMYCIN HCL 10 G IV SOLR
1250.0000 mg | Freq: Once | INTRAVENOUS | Status: DC
  Filled 2016-09-21: qty 1250

## 2016-09-21 MED ORDER — TIOTROPIUM BROMIDE MONOHYDRATE 18 MCG IN CAPS
18.0000 ug | ORAL_CAPSULE | Freq: Every day | RESPIRATORY_TRACT | Status: DC
  Filled 2016-09-21: qty 5

## 2016-09-21 NOTE — ED Notes (Signed)
Patient transported to CT 

## 2016-09-21 NOTE — ED Notes (Signed)
Patient up sitting at beside, SpO2 80% on 6l/m [place]. Placed on NRB mask SpO2 increasing to 94%.

## 2016-09-21 NOTE — H&P (Addendum)
History and Physical    William Kirk NWG:[id number] DOB: July 16, 1940 DOA: 09/21/2016  Referring MD/NP/PA:   PCP: [facility]   Patient coming from:  The patient is coming from home.  At baseline, pt is independent for most of ADL.  Chief Complaint: Productive cough, shortness of breath, weight loss  HPI: William Kirk is a 76 y.o. male with medical history significant of formal smoker, COPD, not on oxygen at home at baseline, depression, PVD, arthritis, who presents with productive cough, shortness breath and weight loss.  Patient states that he had pneumonia in May, which was treated with antibiotics, he has been doing oKay until 2 days ago when he stared SOB and cough again. Patient coughs up greenish colored sputum. He also had intermittent fever with temperature 101 at one time at home. Patient denies chest pain, runny nose or sore throat. Patient states that he has difficulty swallowing sometimes. His pulmonologist, Dr. Alvan Dame in [facility] scheduled him for "endoscopy" on 10/03/16 due to posssible aspiration. Patient states that he had approximately 30 pounds of weight loss since May. Patient denies nausea, vomiting, diarrhea, abdominal pain, symptoms of UTI or unilateral weakness. He has generalized weakness. Patient was found to have oxygen 80% on 6 L nasal cannula oxygen, which improved to 94% on NRB. Of note, pt is on Tobramycin nebs.   ED Course: pt was found to have WBC 19.3, lactate 1.63, potassium 2.7, creatinine normal, temperature normal, no tachycardia, has tachypnea. CT angiogram of chest is negative for PE, but showed multifocal pneumonia, and multiple nodular opacities raises concern for potential underlying neoplasm. Pt is admitted SDU as inpt.   Review of Systems:  General: has fevers, chills, has body weight loss, has poor appetite, has fatigue HEENT: no blurry vision, hearing changes or sore throat Respiratory: has dyspnea, coughing, wheezing CV: no  chest pain, no palpitations GI: no nausea, vomiting, abdominal pain, diarrhea, constipation GU: no dysuria, burning on urination, increased urinary frequency, hematuria  Ext: no leg edema Neuro: no unilateral weakness, numbness, or tingling, no vision change or hearing loss Skin: no rash, no skin tear. MSK: No muscle spasm, no deformity, no limitation of range of movement in spin Heme: No easy bruising.  Travel history: No recent long distant travel.  Allergy: No Known Allergies  Past Medical History:  Diagnosis Date  . Arthritis   . COPD (chronic obstructive pulmonary disease) (HCC)   . PVD (peripheral vascular disease) (HCC) 2014   prev PTCA on RLE    Past Surgical History:  Procedure Laterality Date  . HIP SURGERY Right 2009  . knee aeroscopic Right 2012  . KNEE ARTHROSCOPY Right 2010    Social History:  reports that he quit smoking about 19 months ago. His smoking use included Cigarettes. He has a 50.00 pack-year smoking history. He has never used smokeless tobacco. He reports that he does not drink alcohol or use drugs.  Family History:  Family History  Problem Relation Age of Onset  . Cancer Father 36       Brain  . Dementia Mother      Prior to Admission medications   Medication Sig Start Date End Date Taking? Authorizing Provider  acetaminophen (TYLENOL) 325 MG tablet Take 650 mg by mouth every 6 (six) hours as needed for mild pain or moderate pain.   Yes [provider]  albuterol (PROVENTIL HFA;VENTOLIN HFA) 108 (90 Base) MCG/ACT inhaler Inhale 1-2 puffs into the lungs every 6 (six) hours as  needed for wheezing or shortness of breath. 06/23/16  Yes Calvert Cantor, MD  albuterol (PROVENTIL) (2.5 MG/3ML) 0.083% nebulizer solution Take 3 mLs (2.5 mg total) by nebulization every 6 (six) hours as needed for wheezing or shortness of breath. 06/23/16  Yes Calvert Cantor, MD  aspirin 81 MG tablet Take 81 mg by mouth daily.   Yes [provider]  azithromycin  (ZITHROMAX) 250 MG tablet Take 2 mg by mouth 3 (three) times a week.   Yes [provider]  Budesonide-Formoterol Fumarate (SYMBICORT IN) Inhale 2 puffs into the lungs 2 (two) times daily.    Yes [provider]  cilostazol (PLETAL) 100 MG tablet Take 100 mg by mouth 2 (two) times daily.    Yes [provider]  feeding supplement (BOOST / RESOURCE BREEZE) LIQD Take 1 Container by mouth 2 (two) times daily between meals. 06/23/16  Yes Calvert Cantor, MD  gabapentin (NEURONTIN) 300 MG capsule Take 300 mg by mouth at bedtime.    Yes [provider]  hydrochlorothiazide (HYDRODIURIL) 25 MG tablet Take 25 mg by mouth daily.   Yes [provider]  Multiple Vitamins-Minerals (MULTIVITAMIN WITH MINERALS) tablet Take 1 tablet by mouth daily.   Yes [provider]  protein supplement shake (PREMIER PROTEIN) LIQD Take 325 mLs (11 oz total) by mouth daily. 06/23/16  Yes Calvert Cantor, MD  sertraline (ZOLOFT) 100 MG tablet Take 200 mg by mouth daily.    Yes [provider]  tiotropium (SPIRIVA) 18 MCG inhalation capsule Place 18 mcg into inhaler and inhale 2 (two) times daily.    Yes [provider]  HYDROcodone-acetaminophen (NORCO) 10-325 MG tablet Take 1-2 tablets by mouth every 8 (eight) hours as needed.    [provider]  testosterone cypionate (DEPOTESTOTERONE CYPIONATE) 100 MG/ML injection Inject 200 mg into the muscle every 28 (twenty-eight) days.     [provider]  traZODone (DESYREL) 50 MG tablet Take 50 mg by mouth at bedtime.    [provider]  Vit C-Cholecalciferol-Rose Hip (VITAMIN C & D3/ROSE HIPS PO) Take 1 capsule by mouth daily.    [provider]  vitamin B-12 (CYANOCOBALAMIN) 1000 MCG tablet Take 1,000 mcg by mouth daily.    [provider]    Physical Exam: Vitals:   09/21/16 1700 09/21/16 1730 09/21/16 1850 09/21/16 2037  BP: (!) 122/109 (!) 114/53 (!) 135/48 (!) 154/69    Pulse: 93 85 85 81  Resp: (!) 21 (!) 28 (!) 30 (!) 28  Temp:   97.6 F (36.4 C)   SpO2: 98% 100% 94% 97%  Weight:      Height:       General: in moderate acute respiratory distress.  HEENT:       Eyes: PERRL, EOMI, no scleral icterus.       ENT: No discharge from the ears and nose, no pharynx injection, no tonsillar enlargement.        Neck: No JVD, no bruit, no mass felt. Heme: No neck lymph node enlargement. Cardiac: S1/S2, RRR, No murmurs, No gallops or rubs. Respiratory: has scatted rales, with mild wheezing and rhonchi bilaterally. No rubs. GI: Soft, nondistended, nontender, no rebound pain, no organomegaly, BS present. GU: No hematuria Ext: No pitting leg edema bilaterally. 2+DP/PT pulse bilaterally. Musculoskeletal: No joint deformities, No joint redness or warmth, no limitation of ROM in spin. Skin: No rashes.  Neuro: Alert, oriented X3, cranial nerves II-XII grossly intact, moves all extremities normally.  Psych: Patient  is not psychotic, no suicidal or hemocidal ideation.  Labs on Admission: I have personally reviewed following labs and imaging studies  CBC:  Recent Labs Lab 09/21/16 1055  WBC 19.3*  NEUTROABS 16.7*  HGB 10.5*  HCT 33.5*  MCV 76.5*  PLT 551*   Basic Metabolic Panel:  Recent Labs Lab 09/21/16 1055  NA 136  K 2.7*  CL 95*  CO2 30  GLUCOSE 135*  BUN 26*  CREATININE 0.92  CALCIUM 8.6*   GFR: Estimated Creatinine Clearance: 63.3 mL/min (by C-G formula based on SCr of 0.92 mg/dL). Liver Function Tests:  Recent Labs Lab 09/21/16 1055  AST 73*  ALT 112*  ALKPHOS 133*  BILITOT 0.5  PROT 7.7  ALBUMIN 2.1*   No results for input(s): LIPASE, AMYLASE in the last 168 hours. No results for input(s): AMMONIA in the last 168 hours. Coagulation Profile:  Recent Labs Lab 09/21/16 2009  INR 1.30   Cardiac Enzymes: No results for input(s): CKTOTAL, CKMB, CKMBINDEX, TROPONINI in the last 168 hours. BNP (last 3 results) No results  for input(s): PROBNP in the last 8760 hours. HbA1C: No results for input(s): HGBA1C in the last 72 hours. CBG: No results for input(s): GLUCAP in the last 168 hours. Lipid Profile: No results for input(s): CHOL, HDL, LDLCALC, TRIG, CHOLHDL, LDLDIRECT in the last 72 hours. Thyroid Function Tests: No results for input(s): TSH, T4TOTAL, FREET4, T3FREE, THYROIDAB in the last 72 hours. Anemia Panel: No results for input(s): VITAMINB12, FOLATE, FERRITIN, TIBC, IRON, RETICCTPCT in the last 72 hours. Urine analysis: No results found for: COLORURINE, APPEARANCEUR, LABSPEC, PHURINE, GLUCOSEU, HGBUR, BILIRUBINUR, KETONESUR, PROTEINUR, UROBILINOGEN, NITRITE, LEUKOCYTESUR Sepsis Labs: @LABRCNTIP (procalcitonin:4,lacticidven:4) )No results found for this or any previous visit (from the past 240 hour(s)).   Radiological Exams on Admission: Dg Chest 2 View  Result Date: 09/21/2016 CLINICAL DATA:  Shortness of breath, cough, fever. EXAM: CHEST  2 VIEW COMPARISON:  Radiographs Jun 23, 2016.  CT scan of Jun 21, 2016. FINDINGS: The heart size and mediastinal contours are within normal limits. Atherosclerosis of thoracic aorta is noted. No pneumothorax is noted. Minimal bilateral pleural effusions are noted. Stable right upper lobe coarse densities are noted concerning for scarring. Interval development of right lower lobe atelectasis concerning for endobronchial lesion. Multifocal airspace opacities are noted in the left lung most consistent with pneumonia. Lobular density is seen in left upper lobe which is enlarged compared to prior exam and concerning for possible neoplasm. The visualized skeletal structures are unremarkable. IMPRESSION: Aortic atherosclerosis. Atelectasis of right lower lobe is now noted ; endobronchial obstruction or lesion cannot be excluded. Interval development of patchy air space opacities in the left midlung concerning for pneumonia. Also noted is lobular density in left upper lobe which  appears to be increased in size compared to prior exam concerning for possible neoplasm. CT scan of the chest is recommended for further evaluation. Electronically Signed   By: Lupita Raider, M.D.   On: 09/21/2016 11:49   Ct Angio Chest Pe W And/or Wo Contrast  Result Date: 09/21/2016 CLINICAL DATA:  Shortness of breath with fever and weight loss EXAM: CT ANGIOGRAPHY CHEST WITH CONTRAST TECHNIQUE: Multidetector CT imaging of the chest was performed using the standard protocol during bolus administration of intravenous contrast. Multiplanar CT image reconstructions and MIPs were obtained to evaluate the vascular anatomy. CONTRAST:  100 mL Isovue 370 nonionic COMPARISON:  Chest CT Jun 21, 2016; chest radiograph September 21, 2016 FINDINGS: Cardiovascular: There is no demonstrable pulmonary  embolus. There is no thoracic aortic aneurysm or dissection. There is patchy atherosclerotic calcification in the right common and left common carotid arteries. 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 There is atherosclerotic calcification in the aorta. There are multiple foci of coronary artery calcification. Pericardium is not appreciably thickened. Mediastinum/Nodes: Thyroid appears unremarkable. Multiple foci of adenopathy persist. There are several right paratracheal lymph nodes. The largest of these lymph nodes is noted anterior and slightly to the right of the distal trachea near the carina measuring 2.4 x 1.6 cm. There is a lymph node to the left of the distal trachea measuring 1.9 x 1.5 cm. There is a right hilar lymph node measuring 2.1 x 1.4 cm. There is a left hilar lymph node measuring 1.7 x 1.8 cm. There are several prominent sub- carinal lymph nodes. The largest individual sub- carinal lymph node measures 2.2 x 1.8 cm. There are several sub- carinal lymph nodes in this area of similar  size. There are several scattered subcentimeter lymph nodes throughout the chest as well. No esophageal lesions are evident. Lungs/Pleura: There is consolidation throughout most of the right lower lobe, more pronounced than on prior study. There is extensive multifocal airspace consolidation throughout the left lower lobe, significantly more severe than on prior CT examination 3 months prior. There are patchy areas of airspace consolidation throughout both upper lobes, with multiple areas of nodular appearance throughout the upper lobes, more severe on the right than on the left. There is less consolidation overall in the right upper lobe compared to 3 months prior. There are nodular areas in the right middle lobe as well. Largest individual well-defined nodular lesion is seen in the lateral segment of the right middle lobe measuring 1.9 x 1.6 cm. There is no appreciable pleural effusion. Upper Abdomen: In the visualized upper abdomen, there is aortic atherosclerosis. Visualized upper abdominal structures otherwise appear unremarkable. Note that adrenals are incompletely visualized. Musculoskeletal: There is degenerative change in the thoracic spine. There are no blastic or lytic bone lesions. Review of the MIP images confirms the above findings. IMPRESSION: 1.  No demonstrable pulmonary embolus. 2. There is now consolidation with significant collapse of the right lower lobe. 3. Multiple foci of airspace opacity bilaterally. There is been partial clearing from the right upper lobe, although multiple nodular opacities which are present throughout this area. Nodular opacities are present without both lungs with areas of consolidation throughout the left lung, significantly progressed compared to 3 months prior. There are less confluent nodular opacities scattered throughout the lungs as well, largest measuring approximately 2 cm. 4.  Multiple foci of adenopathy persist. 5. Atherosclerotic calcification in the great  vessels and aorta. 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 There are foci of coronary artery calcification. 6. While there is obviously multifocal pneumonia present, the presence of the multiple nodular opacities raises concern for potential underlying neoplasm. At a minimum, there has been overall progression of pneumonia, particularly throughout the left lung. The adenopathy present was present previously and could have either benign reactive or neoplastic etiology. Given the progression of airspace disease and concern for potential neoplasm, correlation with bronchoscopy may well be warranted. Aortic Atherosclerosis (ICD10-I70.0). Electronically Signed   By: Bretta Bang III M.D.   On: 09/21/2016 13:22     EKG: Independently reviewed. Sinus rhythm, QTC 561, occasional PVC, nonspecific T-wave change.  Assessment/Plan Principal Problem:   Acute respiratory failure with hypoxia (HCC) Active Problems:   Multifocal pneumonia   COPD with acute exacerbation (HCC)   Hypokalemia   Protein-calorie malnutrition, severe (HCC)   HCAP (healthcare-associated pneumonia)   Depression   Aspiration pneumonia (HCC)   HTN (hypertension)   Acute respiratory failure with hypoxia due to multifocal pneumonia, COPD with acute exacerbation and possible aspiration lobar pneumonia and sepsis: CTA of chest is negative for PE, but showed multifocal pneumonia, and multiple nodular opacities raises concern for potential underlying neoplasm. Pt has difficulty swallowing, indicating possible aspiration pneumonia. Patient has productive cough, wheezing and rhonchi on auscultation, indicating C OPD exacerbation. Patient meets criteria for sepsis with leukocytosis and tachypnea. Currently hemodynamically stable. Lactic acid is normal.  - will admit to SDU as inpt - stat ABG - IV Vancomycin, Zosyn and doxycycline - Mucinex for cough  - prn Albuterol Nebs, DuoNebs prn for  SOB - continue home spiriva inhaler - Solumedrol 60 mg bid - Urine legionella and S. pneumococcal antigen - Follow up blood culture x2, sputum culture and respiratory virus panel - will get Procalcitonin and trend lactic acid level per sepsis protocol - IVF: 2L of NS bolus in ED, followed by  100 mL per hour of NS  - will get SLP - need to call pulmonologist for bronchoscopy or INR for biopsy  Hypokalemia: K=2.7 on admission. - Repleted K - give 1 g of magnesium sulfate - Check Mg level  Protein-calorie malnutrition, severe:  -consult to Nutrition  Depression and anxiety: Stable, no suicidal or homicidal ideations. -Continue home medications: zoloft  HTN: -hold HCTZ due to sepsis and need of IVF -IV hydralazine when necessary   DVT ppx: SCD Code Status: Full code Family Communication: Yes, patient's wife at bed side Disposition Plan:  Anticipate discharge back to previous home environment Consults called:  none Admission status:  SDU/inpation       Date of Service 09/21/2016    Lorretta Harp [facility] Pager [phone]  If 7PM-7AM, please contact night-coverage [email or web address] Password [facility] 09/21/2016, 9:47 PM

## 2016-09-21 NOTE — ED Notes (Signed)
Returned to room from CT. 

## 2016-09-21 NOTE — ED Provider Notes (Signed)
[place] DEPT [facility] Provider Note   CSN: [id number] Arrival date & time: 09/21/16  1030     History   Chief Complaint Chief Complaint  Patient presents with  . Shortness of Breath    HPI William Kirk is a 76 y.o. male.  HPI Patient is a 76 year old male with a history of complex pneumonia and COPD presents emergency department with worsening shortness of breath and chills over the past 48 hours.  He was found to be hypoxic to 79% on arrival.  He's had increasing shortness of breath as well as a 30 pound weight loss over the past month.  His shortness of breath is really worsened over the past several days.  No prior history of cancer.  He has no appetite.  He feels slightly weak.  Denies abdominal pain nausea vomiting or diarrhea.  He was hospitalized in May for IV antibiotics for pneumonia.  He states he had a recurrent episode of pneumonia this summer.  He's been told he has aspiration and is scheduled to see GI for a formal endoscopy.  He was seen and evaluated by pulmonary in the hospital and does have a pulmonologist   Past Medical History:  Diagnosis Date  . Arthritis   . COPD (chronic obstructive pulmonary disease) ([place])   . PVD (peripheral vascular disease) ([place]) 2014   prev PTCA on RLE    Patient Active Problem List   Diagnosis Date Noted  . HCAP (healthcare-associated pneumonia)   . COPD (chronic obstructive pulmonary disease) ([place]) 06/21/2016  . Acute respiratory failure with hypoxia ([place]) 06/21/2016  . Hypokalemia 06/21/2016  . Hyponatremia 06/21/2016  . Thrombocytosis ([place]) 06/21/2016  . PVD (peripheral vascular disease) ([place]) 06/21/2016  . Bronchiectasis ([place]) 06/21/2016  . Malnutrition of moderate degree 06/21/2016  . Multifocal pneumonia 06/20/2016  . FUO (fever of unknown origin) 04/15/2015  . 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 Other tear of medial  meniscus, current injury, unspecified knee, initial encounter 12/03/2012    Past Surgical History:  Procedure Laterality Date  . HIP SURGERY Right 2009  . knee aeroscopic Right 2012  . KNEE ARTHROSCOPY Right 2010       Home Medications    Prior to Admission medications   Medication Sig Start Date End Date Taking? Authorizing Provider  acetaminophen (TYLENOL) 325 MG tablet Take 650 mg by mouth every 6 (six) hours as needed for mild pain or moderate pain.   Yes [provider]  albuterol (PROVENTIL HFA;VENTOLIN HFA) 108 (90 Base) MCG/ACT inhaler Inhale 1-2 puffs into the lungs every 6 (six) hours as needed for wheezing or shortness of breath. 06/23/16  Yes Debbe Odea, MD  albuterol (PROVENTIL) (2.5 MG/3ML) 0.083% nebulizer solution Take 3 mLs (2.5 mg total) by nebulization every 6 (six) hours as needed for wheezing or shortness of breath. 06/23/16  Yes Debbe Odea, MD  aspirin 81 MG tablet Take 81 mg by mouth daily.   Yes [provider]  azithromycin (ZITHROMAX) 250 MG tablet Take 2 mg by mouth 3 (three) times a week.   Yes [provider]  Budesonide-Formoterol Fumarate (SYMBICORT IN) Inhale 2 puffs into the lungs 2 (two) times daily.    Yes [provider]  cilostazol (PLETAL) 100 MG tablet Take 100 mg by mouth 2 (two) times daily.    Yes [provider]  feeding supplement (BOOST / RESOURCE BREEZE) LIQD Take 1 Container by mouth 2 (  two) times daily between meals. 06/23/16  Yes Debbe Odea, MD  gabapentin (NEURONTIN) 300 MG capsule Take 300 mg by mouth at bedtime.    Yes [provider]  hydrochlorothiazide (HYDRODIURIL) 25 MG tablet Take 25 mg by mouth daily.   Yes [provider]  Multiple Vitamins-Minerals (MULTIVITAMIN WITH MINERALS) tablet Take 1 tablet by mouth daily.   Yes [provider]  protein supplement shake (PREMIER PROTEIN) LIQD Take 325 mLs (11 oz total) by mouth daily. 06/23/16  Yes Debbe Odea,  MD  sertraline (ZOLOFT) 100 MG tablet Take 200 mg by mouth daily.    Yes [provider]  tiotropium (SPIRIVA) 18 MCG inhalation capsule Place 18 mcg into inhaler and inhale 2 (two) times daily.    Yes [provider]  HYDROcodone-acetaminophen (NORCO) 10-325 MG tablet Take 1-2 tablets by mouth every 8 (eight) hours as needed.    [provider]  levofloxacin (LEVAQUIN) 750 MG tablet Take 1 tablet (750 mg total) by mouth daily. 06/23/16   Debbe Odea, MD  testosterone cypionate (DEPOTESTOTERONE CYPIONATE) 100 MG/ML injection Inject 200 mg into the muscle every 28 (twenty-eight) days.     [provider]  traZODone (DESYREL) 50 MG tablet Take 50 mg by mouth at bedtime.    [provider]  Vit C-Cholecalciferol-Rose Hip (VITAMIN C & D3/ROSE HIPS PO) Take 1 capsule by mouth daily.    [provider]  vitamin B-12 (CYANOCOBALAMIN) 1000 MCG tablet Take 1,000 mcg by mouth daily.    [provider]    Family History Family History  Problem Relation Age of Onset  . Cancer Father 23       Brain  . Dementia Mother     Social History Social History  Substance Use Topics  . Smoking status: Former Smoker    Packs/day: 1.00    Years: 50.00    Types: Cigarettes    Quit date: 2017  . Smokeless tobacco: Never Used     Comment: 10 a day at most  . Alcohol use No     Allergies   Patient has no known allergies.   Review of Systems Review of Systems  All other systems reviewed and are negative.    Physical Exam Updated Vital Signs BP (!) 118/46   Pulse 79   Temp 98.5 F (36.9 C)   Resp (!) 27   Ht 5\' 10"  (1.778 m)   Wt 63.5 kg (140 lb)   SpO2 92%   BMI 20.09 kg/m   Physical Exam  Constitutional: He is oriented to person, place, and time.  Cachectic and pale  HENT:  Head: Normocephalic and atraumatic.  Eyes: EOM are normal.  Neck: Normal range of motion. Neck supple.  Cardiovascular: Normal rate, regular rhythm  and normal heart sounds.   Pulmonary/Chest: No respiratory distress.  Tachypnea.  No accessory muscle use.  Rhonchi diffusely  Abdominal: Soft. He exhibits no distension. There is no tenderness.  Musculoskeletal: Normal range of motion.  Neurological: He is alert and oriented to person, place, and time.  Skin: Skin is warm and dry.  Psychiatric: He has a normal mood and affect.  Nursing note and vitals reviewed.    ED Treatments / Results  Labs (all labs ordered are listed, but only abnormal results are displayed) Labs Reviewed  CBC WITH DIFFERENTIAL/PLATELET - Abnormal; Notable for the following:       Result Value   WBC 19.3 (*)    Hemoglobin 10.5 (*)  HCT 33.5 (*)    MCV 76.5 (*)    MCH 24.0 (*)    RDW 16.6 (*)    Platelets 551 (*)    Neutro Abs 16.7 (*)    Monocytes Absolute 1.2 (*)    All other components within normal limits  COMPREHENSIVE METABOLIC PANEL - Abnormal; Notable for the following:    Potassium 2.7 (*)    Chloride 95 (*)    Glucose, Bld 135 (*)    BUN 26 (*)    Calcium 8.6 (*)    Albumin 2.1 (*)    AST 73 (*)    ALT 112 (*)    Alkaline Phosphatase 133 (*)    All other components within normal limits  CULTURE, BLOOD (ROUTINE X 2)  CULTURE, BLOOD (ROUTINE X 2)  HIV ANTIBODY (ROUTINE TESTING)  LACTATE DEHYDROGENASE  I-STAT CG4 LACTIC ACID, ED  I-STAT CG4 LACTIC ACID, ED    EKG  EKG Interpretation  Date/Time:  Wednesday September 21 2016 11:00:28 EDT Ventricular Rate:  84 PR Interval:    QRS Duration: 91 QT Interval:  474 QTC Calculation: 561 R Axis:   81 Text Interpretation:  Sinus rhythm Atrial premature complex Short PR interval Borderline right axis deviation Nonspecific repol abnormality, diffuse leads Prolonged QT interval No significant change was found Confirmed by Jola Schmidt [phone]) on 09/21/2016 1:48:47 PM       Radiology Dg Chest 2 View  Result Date: 09/21/2016 CLINICAL DATA:  Shortness of breath, cough, fever. EXAM: CHEST  2  VIEW COMPARISON:  Radiographs Jun 23, 2016.  CT scan of Jun 21, 2016. FINDINGS: The heart size and mediastinal contours are within normal limits. Atherosclerosis of thoracic aorta is noted. No pneumothorax is noted. Minimal bilateral pleural effusions are noted. Stable right upper lobe coarse densities are noted concerning for scarring. Interval development of right lower lobe atelectasis concerning for endobronchial lesion. Multifocal airspace opacities are noted in the left lung most consistent with pneumonia. Lobular density is seen in left upper lobe which is enlarged compared to prior exam and concerning for possible neoplasm. The visualized skeletal structures are unremarkable. IMPRESSION: Aortic atherosclerosis. Atelectasis of right lower lobe is now noted ; endobronchial obstruction or lesion cannot be excluded. Interval development of patchy air space opacities in the left midlung concerning for pneumonia. Also noted is lobular density in left upper lobe which appears to be increased in size compared to prior exam concerning for possible neoplasm. CT scan of the chest is recommended for further evaluation. Electronically Signed   By: Marijo Conception, M.D.   On: 09/21/2016 11:49   Ct Angio Chest Pe W And/or Wo Contrast  Result Date: 09/21/2016 CLINICAL DATA:  Shortness of breath with fever and weight loss EXAM: CT ANGIOGRAPHY CHEST WITH CONTRAST TECHNIQUE: Multidetector CT imaging of the chest was performed using the standard protocol during bolus administration of intravenous contrast. Multiplanar CT image reconstructions and MIPs were obtained to evaluate the vascular anatomy. CONTRAST:  100 mL Isovue 370 nonionic COMPARISON:  Chest CT Jun 21, 2016; chest radiograph September 21, 2016 FINDINGS: Cardiovascular: There is no demonstrable pulmonary embolus. There is no thoracic aortic aneurysm or dissection. There is patchy atherosclerotic calcification in the right common and left common carotid arteries.  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 There is atherosclerotic calcification in the aorta. There are multiple foci of coronary artery calcification.  Pericardium is not appreciably thickened. Mediastinum/Nodes: Thyroid appears unremarkable. Multiple foci of adenopathy persist. There are several right paratracheal lymph nodes. The largest of these lymph nodes is noted anterior and slightly to the right of the distal trachea near the carina measuring 2.4 x 1.6 cm. There is a lymph node to the left of the distal trachea measuring 1.9 x 1.5 cm. There is a right hilar lymph node measuring 2.1 x 1.4 cm. There is a left hilar lymph node measuring 1.7 x 1.8 cm. There are several prominent sub- carinal lymph nodes. The largest individual sub- carinal lymph node measures 2.2 x 1.8 cm. There are several sub- carinal lymph nodes in this area of similar size. There are several scattered subcentimeter lymph nodes throughout the chest as well. No esophageal lesions are evident. Lungs/Pleura: There is consolidation throughout most of the right lower lobe, more pronounced than on prior study. There is extensive multifocal airspace consolidation throughout the left lower lobe, significantly more severe than on prior CT examination 3 months prior. There are patchy areas of airspace consolidation throughout both upper lobes, with multiple areas of nodular appearance throughout the upper lobes, more severe on the right than on the left. There is less consolidation overall in the right upper lobe compared to 3 months prior. There are nodular areas in the right middle lobe as well. Largest individual well-defined nodular lesion is seen in the lateral segment of the right middle lobe measuring 1.9 x 1.6 cm. There is no appreciable pleural effusion. Upper Abdomen: In the visualized upper abdomen, there is  aortic atherosclerosis. Visualized upper abdominal structures otherwise appear unremarkable. Note that adrenals are incompletely visualized. Musculoskeletal: There is degenerative change in the thoracic spine. There are no blastic or lytic bone lesions. Review of the MIP images confirms the above findings. IMPRESSION: 1.  No demonstrable pulmonary embolus. 2. There is now consolidation with significant collapse of the right lower lobe. 3. Multiple foci of airspace opacity bilaterally. There is been partial clearing from the right upper lobe, although multiple nodular opacities which are present throughout this area. Nodular opacities are present without both lungs with areas of consolidation throughout the left lung, significantly progressed compared to 3 months prior. There are less confluent nodular opacities scattered throughout the lungs as well, largest measuring approximately 2 cm. 4.  Multiple foci of adenopathy persist. 5. Atherosclerotic calcification in the great vessels and aorta. 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 There are foci of coronary artery calcification. 6. While there is obviously multifocal pneumonia present, the presence of the multiple nodular opacities raises concern for potential underlying neoplasm. At a minimum, there has been overall progression of pneumonia, particularly throughout the left lung. The adenopathy present was present previously and could have either benign reactive or neoplastic etiology. Given the progression of airspace disease and concern for potential neoplasm, correlation with bronchoscopy may well be warranted. Aortic Atherosclerosis (ICD10-I70.0). Electronically Signed   By: Lowella Grip III M.D.   On: 09/21/2016 13:22    Procedures .Critical Care Performed by: Jola Schmidt Authorized by: Jola Schmidt     CRITICAL CARE Performed by: Hoy Morn Total critical care time: 31 minutes Critical  care time was exclusive of separately billable procedures and treating other patients. Critical care was necessary to treat or prevent imminent or life-threatening deterioration. Critical care was time spent personally by me on the following activities: development of treatment plan with patient and/or surrogate as well as nursing, discussions with  consultants, evaluation of patient's response to treatment, examination of patient, obtaining history from patient or surrogate, ordering and performing treatments and interventions, ordering and review of laboratory studies, ordering and review of radiographic studies, pulse oximetry and re-evaluation of patient's condition.  Medications Ordered in ED Medications  potassium chloride 10 mEq in 100 mL IVPB (10 mEq Intravenous New Bag/Given 09/21/16 1236)  vancomycin (VANCOCIN) IVPB 1000 mg/200 mL premix (1,000 mg Intravenous New Bag/Given 09/21/16 1313)  piperacillin-tazobactam (ZOSYN) IVPB 3.375 g (0 g Intravenous Stopped 09/21/16 1308)  iopamidol (ISOVUE-370) 76 % injection 100 mL (100 mLs Intravenous Contrast Given 09/21/16 1251)     Initial Impression / Assessment and Plan / ED Course  I have reviewed the triage vital signs and the nursing notes.  Pertinent labs & imaging results that were available during my care of the patient were reviewed by me and considered in my medical decision making (see chart for details).     Significant hypoxia requiring nonrebreather mask on arrival to emergency department.  Patient with evidence of healthcare associated pneumonia.  Broad-spectrum antibiotics.  Blood cultures obtained.  Lactate reassuring.  White count elevated at 19,000.  The biggest overlying concern would be underlying cancer somewhere as his had a 30 pound weight loss over the past 30 days with lack of appetite.  No abdominal symptoms at this time.  He will need CT scan of his abdomen and pelvis likely tomorrow.  He'll need pulmonary consultation as I  think he will benefit from bronchoscopy with bronchial washings.  Patient be admitted to stepdown unit to [facility].  Dr. Linna Darner accepting in transfer  Final Clinical Impressions(s) / ED Diagnoses   Final diagnoses:  None    New Prescriptions New Prescriptions   No medications on file     Jola Schmidt, MD 09/21/16 1401

## 2016-09-21 NOTE — ED Triage Notes (Addendum)
SOB more than usual x past 2 days, wt loss of 30 pds since May. Was told to come to ED, no beds at the [facility] . Also fever

## 2016-09-21 NOTE — ED Notes (Signed)
Report called to primary RN at [facility] , [place]

## 2016-09-21 NOTE — Progress Notes (Signed)
Pharmacy Antibiotic Note  DAIEL STROHECKER is a 76 y.o. male admitted on 09/21/2016 with pneumonia.  Pharmacy has been consulted for Vancomycin / Zosyn dosing.  Plan: Zosyn 3.375g IV q8h (4 hour infusion).  Vancomycin 750 mg iv Q 12 Follow up cultures, progress, Scr  Height: 5\' 10"  (177.8 cm) Weight: 140 lb (63.5 kg) IBW/kg (Calculated) : 73  Temp (24hrs), Avg:98.1 F (36.7 C), Min:97.6 F (36.4 C), Max:98.5 F (36.9 C)   Recent Labs Lab 09/21/16 1055 09/21/16 1111  WBC 19.3*  --   CREATININE 0.92  --   LATICACIDVEN  --  1.63    Estimated Creatinine Clearance: 63.3 mL/min (by C-G formula based on SCr of 0.92 mg/dL).    No Known Allergies  Thank you Anette Guarneri, PharmD [phone]  09/21/2016 8:01 PM

## 2016-09-22 ENCOUNTER — Inpatient Hospital Stay (HOSPITAL_COMMUNITY): Payer: Non-veteran care

## 2016-09-22 DIAGNOSIS — J690 Pneumonitis due to inhalation of food and vomit: Secondary | ICD-10-CM

## 2016-09-22 DIAGNOSIS — E43 Unspecified severe protein-calorie malnutrition: Secondary | ICD-10-CM

## 2016-09-22 DIAGNOSIS — J9601 Acute respiratory failure with hypoxia: Secondary | ICD-10-CM

## 2016-09-22 DIAGNOSIS — R0902 Hypoxemia: Secondary | ICD-10-CM

## 2016-09-22 DIAGNOSIS — J441 Chronic obstructive pulmonary disease with (acute) exacerbation: Secondary | ICD-10-CM

## 2016-09-22 DIAGNOSIS — I10 Essential (primary) hypertension: Secondary | ICD-10-CM

## 2016-09-22 LAB — BASIC METABOLIC PANEL
Anion gap: 9 (ref 5–15)
BUN: 20 mg/dL (ref 6–20)
CO2: 25 mmol/L (ref 22–32)
CREATININE: 0.76 mg/dL (ref 0.61–1.24)
Calcium: 8.4 mg/dL — ABNORMAL LOW (ref 8.9–10.3)
Chloride: 102 mmol/L (ref 101–111)
Glucose, Bld: 159 mg/dL — ABNORMAL HIGH (ref 65–99)
POTASSIUM: 2.8 mmol/L — AB (ref 3.5–5.1)
SODIUM: 136 mmol/L (ref 135–145)

## 2016-09-22 LAB — RESPIRATORY PANEL BY PCR
ADENOVIRUS-RVPPCR: NOT DETECTED
Bordetella pertussis: NOT DETECTED
CHLAMYDOPHILA PNEUMONIAE-RVPPCR: NOT DETECTED
CORONAVIRUS HKU1-RVPPCR: NOT DETECTED
CORONAVIRUS NL63-RVPPCR: NOT DETECTED
Coronavirus 229E: NOT DETECTED
Coronavirus OC43: NOT DETECTED
Influenza A H1 2009: NOT DETECTED
Influenza A H1: NOT DETECTED
Influenza A H3: NOT DETECTED
Influenza A: NOT DETECTED
Influenza B: NOT DETECTED
Metapneumovirus: NOT DETECTED
Mycoplasma pneumoniae: NOT DETECTED
PARAINFLUENZA VIRUS 1-RVPPCR: NOT DETECTED
PARAINFLUENZA VIRUS 2-RVPPCR: NOT DETECTED
PARAINFLUENZA VIRUS 3-RVPPCR: NOT DETECTED
PARAINFLUENZA VIRUS 4-RVPPCR: NOT DETECTED
RHINOVIRUS / ENTEROVIRUS - RVPPCR: NOT DETECTED
Respiratory Syncytial Virus: NOT DETECTED

## 2016-09-22 LAB — HIV ANTIBODY (ROUTINE TESTING W REFLEX): HIV Screen 4th Generation wRfx: NONREACTIVE

## 2016-09-22 LAB — MAGNESIUM: Magnesium: 2 mg/dL (ref 1.7–2.4)

## 2016-09-22 MED ORDER — ORAL CARE MOUTH RINSE
15.0000 mL | Freq: Two times a day (BID) | OROMUCOSAL | Status: DC
  Administered 2016-09-22 – 2016-09-26 (×4): 15 mL via OROMUCOSAL

## 2016-09-22 MED ORDER — IPRATROPIUM-ALBUTEROL 0.5-2.5 (3) MG/3ML IN SOLN
3.0000 mL | Freq: Four times a day (QID) | RESPIRATORY_TRACT | Status: DC
  Administered 2016-09-22: 3 mL via RESPIRATORY_TRACT
  Filled 2016-09-22: qty 3

## 2016-09-22 MED ORDER — WHITE PETROLATUM GEL
Status: AC
  Filled 2016-09-22: qty 1

## 2016-09-22 MED ORDER — ALBUTEROL SULFATE (2.5 MG/3ML) 0.083% IN NEBU: 2.5000 mg | INHALATION_SOLUTION | RESPIRATORY_TRACT | Status: DC | PRN

## 2016-09-22 MED ORDER — METHYLPREDNISOLONE SODIUM SUCC 125 MG IJ SOLR
60.0000 mg | Freq: Every day | INTRAMUSCULAR | Status: DC
  Administered 2016-09-23: 60 mg via INTRAVENOUS
  Filled 2016-09-22: qty 2

## 2016-09-22 MED ORDER — SODIUM CHLORIDE 3 % IN NEBU
4.0000 mL | INHALATION_SOLUTION | Freq: Two times a day (BID) | RESPIRATORY_TRACT | Status: AC
  Administered 2016-09-22 – 2016-09-24 (×5): 4 mL via RESPIRATORY_TRACT
  Filled 2016-09-22 (×6): qty 4

## 2016-09-22 MED ORDER — IPRATROPIUM-ALBUTEROL 0.5-2.5 (3) MG/3ML IN SOLN
3.0000 mL | Freq: Three times a day (TID) | RESPIRATORY_TRACT | Status: DC
  Administered 2016-09-23 (×2): 3 mL via RESPIRATORY_TRACT
  Filled 2016-09-22 (×2): qty 3

## 2016-09-22 NOTE — Evaluation (Signed)
Physical Therapy Evaluation Patient Details Name: William Kirk MRN: [id number] DOB: [birth year] Today's Date: 09/22/2016   History of Present Illness   Pt admitted with PNA/sepsis- acute resp. failure                Past Medical History:  Diagnosis Date  . Arthritis    "hands" (09/21/2016)  . Bleeding stomach ulcer 2000s  . COPD (chronic obstructive pulmonary disease) ([facility])   . Depression   . Pneumonia    "now and several times before this" (09/21/2016)  . PVD (peripheral vascular disease) ([facility]) 2014   prev PTCA on RLE    Clinical Impression  Pt admitted with above diagnosis. Pt currently with functional limitations due to the deficits listed below (see PT Problem List). Pt was able to stand and pivot to recliner but desats with talking and transfers to mid 80's even on HFNC at 10L.   Will follow acutely and progress pt as able.   Pt will benefit from skilled PT to increase their independence and safety with mobility to allow discharge to the venue listed below.    Follow Up Recommendations Home health PT;Supervision/Assistance - 24 hour    Equipment Recommendations  None recommended by PT    Recommendations for Other Services       Precautions / Restrictions Precautions Precautions: Fall Restrictions Weight Bearing Restrictions: No      Mobility  Bed Mobility Overal bed mobility: Independent                Transfers Overall transfer level: Needs assistance Equipment used: None Transfers: Sit to/from Stand;Stand Pivot Transfers Sit to Stand: Supervision Stand pivot transfers: Min guard       General transfer comment: Pt did reach for UE support for bed to chair transfer.  Desat with talking in istting to 86%.  Desat to 85% once transfer to chair and took a few min to incr O2 to 90% and > on 10LHFNC.  did not push pt due to desat.    Ambulation/Gait                Stairs            Wheelchair Mobility    Modified Rankin (Stroke Patients  Only)       Balance Overall balance assessment: Needs assistance Sitting-balance support: No upper extremity supported;Feet supported Sitting balance-Leahy Scale: Fair     Standing balance support: No upper extremity supported;During functional activity Standing balance-Leahy Scale: Poor Standing balance comment: Needed Ue support for stability in standing.                              Pertinent Vitals/Pain Pain Assessment: No/denies pain    Home Living Family/patient expects to be discharged to:: Private residence Living Arrangements: Spouse/significant other Available Help at Discharge: Family Type of Home: House Home Access: Stairs to enter Entrance Stairs-Rails: Right;Left;Can reach both Secretary/administrator of Steps: 2 Home Layout: One level Home Equipment: Shower seat - built in;Cane - single point;Walker - 2 wheels      Prior Function Level of Independence: Independent               Hand Dominance        Extremity/Trunk Assessment   Upper Extremity Assessment Upper Extremity Assessment: Defer to OT evaluation    Lower Extremity Assessment Lower Extremity Assessment: Generalized weakness    Cervical / Trunk Assessment Cervical / Trunk Assessment:  Normal  Communication   Communication: No difficulties  Cognition Arousal/Alertness: Awake/alert Behavior During Therapy: WFL for tasks assessed/performed Overall Cognitive Status: Within Functional Limits for tasks assessed                                        General Comments      Exercises     Assessment/Plan    PT Assessment Patient needs continued PT services  PT Problem List Decreased strength;Decreased activity tolerance;Decreased balance;Decreased mobility;Decreased knowledge of use of DME;Decreased safety awareness;Decreased knowledge of precautions;Cardiopulmonary status limiting activity       PT Treatment Interventions DME instruction;Gait  training;Stair training;Therapeutic exercise;Therapeutic activities;Functional mobility training;Patient/family education;Balance training    PT Goals (Current goals can be found in the Care Plan section)  Acute Rehab PT Goals Patient Stated Goal: to get better PT Goal Formulation: With patient Time For Goal Achievement: 09/29/16 Potential to Achieve Goals: Good    Frequency Min 3X/week   Barriers to discharge        Co-evaluation               AM-PAC PT "6 Clicks" Daily Activity  Outcome Measure Difficulty turning over in bed (including adjusting bedclothes, sheets and blankets)?: None Difficulty moving from lying on back to sitting on the side of the bed? : None Difficulty sitting down on and standing up from a chair with arms (e.g., wheelchair, bedside commode, etc,.)?: None Help needed moving to and from a bed to chair (including a wheelchair)?: A Little Help needed walking in hospital room?: Total Help needed climbing 3-5 steps with a railing? : Total 6 Click Score: 17    End of Session Equipment Utilized During Treatment: Gait belt;Oxygen Activity Tolerance: Patient limited by fatigue Patient left: in chair;with call bell/phone within reach;with chair alarm set Nurse Communication: Mobility status PT Visit Diagnosis: Muscle weakness (generalized) (M62.81);Unsteadiness on feet (R26.81)    Time: 1200-1212 PT Time Calculation (min) (ACUTE ONLY): 12 min   Charges:   PT Evaluation $PT Eval Moderate Complexity: 1 Mod     PT G Codes:        Beverly Suriano,PT Acute Rehabilitation [phone] (pager)   [place] Lopes 09/22/2016, 2:14 PM

## 2016-09-22 NOTE — Evaluation (Signed)
Clinical/Bedside Swallow Evaluation Patient Details  Name: William Kirk MRN: [id number] Date of Birth: 09/02/1940  Today's Date: 09/22/2016 Time: SLP Start Time (ACUTE ONLY): 0820 SLP Stop Time (ACUTE ONLY): 0834 SLP Time Calculation (min) (ACUTE ONLY): 14 min  Past Medical History:  Past Medical History:  Diagnosis Date  . Arthritis    "hands" (09/21/2016)  . Bleeding stomach ulcer 2000s  . COPD (chronic obstructive pulmonary disease) (HCC)   . Depression   . Pneumonia    "now and several times before this" (09/21/2016)  . PVD (peripheral vascular disease) (HCC) 2014   prev PTCA on RLE   Past Surgical History:  Past Surgical History:  Procedure Laterality Date  . JOINT REPLACEMENT    . KNEE ARTHROSCOPY Right X 1  . TOTAL HIP ARTHROPLASTY Right 2009   HPI:  William Kirk a 76 y.o.malewith medical history significant of formal smoker, COPD, pna (May 2018), depression, PVD, arthritis, who presents with productive cough, shortness breath and weight loss (30 pounds since May 2018). His pulmonologist, Dr. Alvan Dame in [facility] scheduled him for "endoscopy" on 10/03/16 due to posssible aspiration. Patient states that he had approximately 30 pounds of weight loss since May. CT angiogram of chest is negative for PE, but showed multifocal pneumonia, and multiple nodular opacities raises concern for potential underlying neoplasm. Pt reports "I eat and it comes back up at least once a day." Pt states he had a MBS 2 weeks ago at [facility] and thinks nectar thick was recommended and mentioned a "chin tuck."   Assessment / Plan / Recommendation Clinical Impression  Pt exhibiting indications of pharyngeal dysphagia with delayed cough with thin and has a congested baseline cough. Given pt's history of aspiration, need for thick liquids, recommend repeat MBS to assess current swallow function. MBS scheduled for 11:30 today. Spoke with RN to keep NPO until then.   SLP Visit Diagnosis:  Dysphagia, unspecified (R13.10)    Aspiration Risk   (mild-mod)    Diet Recommendation NPO        Other  Recommendations Oral Care Recommendations: Oral care QID   Follow up Recommendations  (TBD)      Frequency and Duration            Prognosis        Swallow Study   General HPI: William Kirk a 76 y.o.malewith medical history significant of formal smoker, COPD, pna (May 2018), depression, PVD, arthritis, who presents with productive cough, shortness breath and weight loss (30 pounds since May 2018). His pulmonologist, Dr. Alvan Dame in [facility] scheduled him for "endoscopy" on 10/03/16 due to posssible aspiration. Patient states that he had approximately 30 pounds of weight loss since May. CT angiogram of chest is negative for PE, but showed multifocal pneumonia, and multiple nodular opacities raises concern for potential underlying neoplasm. Pt reports "I eat and it comes back up at least once a day." Pt states he had a MBS 2 weeks ago at [facility] and thinks nectar thick was recommended and mentioned a "chin tuck." Type of Study: Bedside Swallow Evaluation Previous Swallow Assessment:  (see HPI) Diet Prior to this Study: Regular;Thin liquids Temperature Spikes Noted: No Respiratory Status: Nasal cannula History of Recent Intubation: No Behavior/Cognition: Alert;Cooperative;Pleasant mood Oral Cavity Assessment: Within Functional Limits Oral Care Completed by SLP: No Oral Cavity - Dentition: Dentures, top (no lower dentition) Vision: Functional for self-feeding Self-Feeding Abilities: Able to feed self Patient Positioning: Upright in chair Baseline Vocal Quality: Normal  Volitional Cough: Strong Volitional Swallow: Able to elicit    Oral/Motor/Sensory Function Overall Oral Motor/Sensory Function: Within functional limits   Ice Chips Ice chips: Not tested   Thin Liquid Thin Liquid: Impaired Presentation: Cup Pharyngeal  Phase Impairments: Cough - Delayed     Nectar Thick Nectar Thick Liquid: Not tested   Honey Thick Honey Thick Liquid: Not tested   Puree Puree: Not tested   Solid   GO   Solid: Not tested        Royce Macadamia 09/22/2016,12:31 PM  Breck Coons Lonell Face.Ed [employer] [phone]

## 2016-09-22 NOTE — Progress Notes (Signed)
PROGRESS NOTE    William Kirk  VFI:[id number] DOB: [birth year] DOA: 09/21/2016 PCP: [facility]    Brief Narrative:  76 year old male presented with productive cough, dyspnea and weight loss. Patient is known to have COPD, tobacco abuse, arthritis, and depression. He developed dyspnea for last 2 days prior to hospitalization, associated with greenish productive sputum, intermittent fevers and generalized weakness. He has been worked up for swallowing dysfunction as an outpatient. On initial physical examination his oxygen saturation was found to be 80% on 6 L per nasal cannula, blood pressure 114/53, heart rate 85-93, respiratory rate 21-30. Moist mucous membranes, heart S1-S2 present rhythmic, no gallops, rubs murmurs, lungs with scattered rails, wheezing and rhonchi bilaterally, abdomen soft nontender, lower extremities no edema. Sodium 136, potassium 2.7, chloride 95, bicarbonate 30, glucose 135, BUN 26, creatinine 0.92, white cell count 9.3, hemoglobin 10.5, hematocrit 33.5, platelets 551. EKG normal sinus rhythm, rate 82 bpm, normal axis, normal intervals. His x-ray with new left upper lobe infiltrate, alveolar, soft lung volume right lower lobe. CT chest with no pulmonary embolism, collapse of the right lower lobe, left upper and lower lobe infiltrates.   Patient was admitted to the hospital working diagnosis of acute hypoxic failure due to multifocal pneumonia.   Assessment & Plan:   Principal Problem:   Acute respiratory failure with hypoxia (HCC) Active Problems:   Multifocal pneumonia   COPD with acute exacerbation (HCC)   Hypokalemia   Protein-calorie malnutrition, severe (HCC)   HCAP (healthcare-associated pneumonia)   Depression   Aspiration pneumonia (HCC)   HTN (hypertension)   1. Hypoxic respiratory failure, acute. Will continue oxymetry monitoring and supplemental 02 per high flow nasal cannula, will plant to use bipap in case of worsening respiratory  distress. Follow a restrictive IV fluids strategy.   2. Multilobar pneumonia, predominantly left upper lobe, present on admission. Suspected to be aspiration, will follow with speech recommendations. Aspiration precautions. Will continue antibiotic therapy with IV Zosyn, will hold on Vancomycin. Patient will need follow up chest CT to prove resolution of infiltrates. Noted loss lung volume on the right, will add aggressive chest pt, incentive spirometer, flutter valve and hypertonic saline, If no improvement will need bronchoscopy, will follow radiographic improvement in am. Decrease dose of steroids.   3. Hypokalemia. Serum K down to 2, 7 with preserved renal function per cr, patient had 70 meq of kcl, will follow on electrolytes in am.   4. Calorie protein malnutrition. Will consult dietary, for nutritional assessment.   5. Hypertension. Continue as needed hydralazine, blood pressure systolic 118 to 120  6. Depression/anxiety. Will continue trazodone and sertraline.    DVT prophylaxis: enoxaparin  Code Status: full  Family Communication:  Disposition Plan:    Consultants:     Procedures:     Antimicrobials:   Vancomycin  Zosyn    Subjective: Patient with persistent dyspnea, moderate in intensity, worse with minimal efforts, associated with cough, no wheezing, improved with oxygen and bronchodilators, no associated chest pain, no nausea or vomiting.   Objective: Vitals:   09/21/16 2348 09/22/16 0418 09/22/16 0731 09/22/16 0840  BP: (!) 118/54 (!) 104/55  (!) 122/58  Pulse: 71 67  72  Resp: 20 (!) 23  (!) 21  Temp: 98.2 F (36.8 C) (!) 97.5 F (36.4 C)  98.7 F (37.1 C)  TempSrc: Oral Oral  Axillary  SpO2: 96% 95% 96% 90%  Weight:  69 kg (152 lb 3.2 oz)    Height:  Intake/Output Summary (Last 24 hours) at 09/22/16 1135 Last data filed at 09/22/16 0865  Gross per 24 hour  Intake          1546.67 ml  Output              250 ml  Net          1296.67 ml    Filed Weights   09/21/16 1051 09/22/16 0418  Weight: 63.5 kg (140 lb) 69 kg (152 lb 3.2 oz)    Examination:  General exam: deconditioned, and dyspneic.  E ENT. Positive pallor, no icterus, oral mucosa dry.  Respiratory system: Positive rales and rhonchi, predominant on the right lower lobe and left upper lobe, no wheezing. Increase work of breathing with accessory muscle use.  Cardiovascular system: S1 & S2 heard, RRR. No JVD, murmurs, rubs, gallops or clicks. No pedal edema. Gastrointestinal system: Abdomen is nondistended, soft and nontender. No organomegaly or masses felt. Normal bowel sounds heard. Central nervous system: Alert and oriented. No focal neurological deficits. Extremities: Symmetric 5 x 5 power. Skin: No rashes, lesions or ulcers     Data Reviewed: I have personally reviewed following labs and imaging studies  CBC:  Recent Labs Lab 09/21/16 1055  WBC 19.3*  NEUTROABS 16.7*  HGB 10.5*  HCT 33.5*  MCV 76.5*  PLT 551*   Basic Metabolic Panel:  Recent Labs Lab 09/21/16 1055 09/22/16 0406  NA 136  --   K 2.7*  --   CL 95*  --   CO2 30  --   GLUCOSE 135*  --   BUN 26*  --   CREATININE 0.92  --   CALCIUM 8.6*  --   MG  --  2.0   GFR: Estimated Creatinine Clearance: 68.8 mL/min (by C-G formula based on SCr of 0.92 mg/dL). Liver Function Tests:  Recent Labs Lab 09/21/16 1055  AST 73*  ALT 112*  ALKPHOS 133*  BILITOT 0.5  PROT 7.7  ALBUMIN 2.1*   No results for input(s): LIPASE, AMYLASE in the last 168 hours. No results for input(s): AMMONIA in the last 168 hours. Coagulation Profile:  Recent Labs Lab 09/21/16 2009  INR 1.30   Cardiac Enzymes: No results for input(s): CKTOTAL, CKMB, CKMBINDEX, TROPONINI in the last 168 hours. BNP (last 3 results) No results for input(s): PROBNP in the last 8760 hours. HbA1C: No results for input(s): HGBA1C in the last 72 hours. CBG: No results for input(s): GLUCAP in the last 168 hours. Lipid  Profile: No results for input(s): CHOL, HDL, LDLCALC, TRIG, CHOLHDL, LDLDIRECT in the last 72 hours. Thyroid Function Tests: No results for input(s): TSH, T4TOTAL, FREET4, T3FREE, THYROIDAB in the last 72 hours. Anemia Panel: No results for input(s): VITAMINB12, FOLATE, FERRITIN, TIBC, IRON, RETICCTPCT in the last 72 hours. Sepsis Labs:  Recent Labs Lab 09/21/16 1111 09/21/16 2009  PROCALCITON  --  0.36  LATICACIDVEN 1.63 0.8    Recent Results (from the past 240 hour(s))  Blood culture (routine x 2)     Status: None (Preliminary result)   Collection Time: 09/21/16 10:55 AM  Result Value Ref Range Status   Specimen Description BLOOD BLOOD LEFT ARM  Final   Special Requests   Final    BOTTLES DRAWN AEROBIC AND ANAEROBIC Blood Culture adequate volume   Culture   Final    NO GROWTH < 24 HOURS Performed at [facility], [address] [place], [place] [address]    Report Status PENDING  Incomplete  Blood culture (routine x 2)     Status: None (Preliminary result)   Collection Time: 09/21/16 11:10 AM  Result Value Ref Range Status   Specimen Description BLOOD BLOOD RIGHT ARM  Final   Special Requests   Final    BOTTLES DRAWN AEROBIC AND ANAEROBIC Blood Culture adequate volume   Culture   Final    NO GROWTH < 24 HOURS Performed at [facility], [address], [place], [place] [address]    Report Status PENDING  Incomplete  Respiratory Panel by PCR     Status: None   Collection Time: 09/21/16  7:49 PM  Result Value Ref Range Status   Adenovirus NOT DETECTED NOT DETECTED Final   Coronavirus 229E NOT DETECTED NOT DETECTED Final   Coronavirus HKU1 NOT DETECTED NOT DETECTED Final   Coronavirus NL63 NOT DETECTED NOT DETECTED Final   Coronavirus OC43 NOT DETECTED NOT DETECTED Final   Metapneumovirus NOT DETECTED NOT DETECTED Final   Rhinovirus / Enterovirus NOT DETECTED NOT DETECTED Final   Influenza A NOT DETECTED NOT DETECTED Final   Influenza A H1 NOT DETECTED NOT  DETECTED Final   Influenza A H1 2009 NOT DETECTED NOT DETECTED Final   Influenza A H3 NOT DETECTED NOT DETECTED Final   Influenza B NOT DETECTED NOT DETECTED Final   Parainfluenza Virus 1 NOT DETECTED NOT DETECTED Final   Parainfluenza Virus 2 NOT DETECTED NOT DETECTED Final   Parainfluenza Virus 3 NOT DETECTED NOT DETECTED Final   Parainfluenza Virus 4 NOT DETECTED NOT DETECTED Final   Respiratory Syncytial Virus NOT DETECTED NOT DETECTED Final   Bordetella pertussis NOT DETECTED NOT DETECTED Final   Chlamydophila pneumoniae NOT DETECTED NOT DETECTED Final   Mycoplasma pneumoniae NOT DETECTED NOT DETECTED Final         Radiology Studies: Dg Chest 2 View  Result Date: 09/21/2016 CLINICAL DATA:  Shortness of breath, cough, fever. EXAM: CHEST  2 VIEW COMPARISON:  Radiographs Jun 23, 2016.  CT scan of Jun 21, 2016. FINDINGS: The heart size and mediastinal contours are within normal limits. Atherosclerosis of thoracic aorta is noted. No pneumothorax is noted. Minimal bilateral pleural effusions are noted. Stable right upper lobe coarse densities are noted concerning for scarring. Interval development of right lower lobe atelectasis concerning for endobronchial lesion. Multifocal airspace opacities are noted in the left lung most consistent with pneumonia. Lobular density is seen in left upper lobe which is enlarged compared to prior exam and concerning for possible neoplasm. The visualized skeletal structures are unremarkable. IMPRESSION: Aortic atherosclerosis. Atelectasis of right lower lobe is now noted ; endobronchial obstruction or lesion cannot be excluded. Interval development of patchy air space opacities in the left midlung concerning for pneumonia. Also noted is lobular density in left upper lobe which appears to be increased in size compared to prior exam concerning for possible neoplasm. CT scan of the chest is recommended for further evaluation. Electronically Signed   By: Lupita Raider, M.D.   On: 09/21/2016 11:49   Ct Angio Chest Pe W And/or Wo Contrast  Result Date: 09/21/2016 CLINICAL DATA:  Shortness of breath with fever and weight loss EXAM: CT ANGIOGRAPHY CHEST WITH CONTRAST TECHNIQUE: Multidetector CT imaging of the chest was performed using the standard protocol during bolus administration of intravenous contrast. Multiplanar CT image reconstructions and MIPs were obtained to evaluate the vascular anatomy. CONTRAST:  100 mL Isovue 370 nonionic COMPARISON:  Chest CT Jun 21, 2016; chest radiograph  September 21, 2016 FINDINGS: Cardiovascular: There is no demonstrable pulmonary embolus. There is no thoracic aortic aneurysm or dissection. There is patchy atherosclerotic calcification in the right common and left common carotid arteries. 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 There is atherosclerotic calcification in the aorta. There are multiple foci of coronary artery calcification. Pericardium is not appreciably thickened. Mediastinum/Nodes: Thyroid appears unremarkable. Multiple foci of adenopathy persist. There are several right paratracheal lymph nodes. The largest of these lymph nodes is noted anterior and slightly to the right of the distal trachea near the carina measuring 2.4 x 1.6 cm. There is a lymph node to the left of the distal trachea measuring 1.9 x 1.5 cm. There is a right hilar lymph node measuring 2.1 x 1.4 cm. There is a left hilar lymph node measuring 1.7 x 1.8 cm. There are several prominent sub- carinal lymph nodes. The largest individual sub- carinal lymph node measures 2.2 x 1.8 cm. There are several sub- carinal lymph nodes in this area of similar size. There are several scattered subcentimeter lymph nodes throughout the chest as well. No esophageal lesions are evident. Lungs/Pleura: There is consolidation throughout most of the right  lower lobe, more pronounced than on prior study. There is extensive multifocal airspace consolidation throughout the left lower lobe, significantly more severe than on prior CT examination 3 months prior. There are patchy areas of airspace consolidation throughout both upper lobes, with multiple areas of nodular appearance throughout the upper lobes, more severe on the right than on the left. There is less consolidation overall in the right upper lobe compared to 3 months prior. There are nodular areas in the right middle lobe as well. Largest individual well-defined nodular lesion is seen in the lateral segment of the right middle lobe measuring 1.9 x 1.6 cm. There is no appreciable pleural effusion. Upper Abdomen: In the visualized upper abdomen, there is aortic atherosclerosis. Visualized upper abdominal structures otherwise appear unremarkable. Note that adrenals are incompletely visualized. Musculoskeletal: There is degenerative change in the thoracic spine. There are no blastic or lytic bone lesions. Review of the MIP images confirms the above findings. IMPRESSION: 1.  No demonstrable pulmonary embolus. 2. There is now consolidation with significant collapse of the right lower lobe. 3. Multiple foci of airspace opacity bilaterally. There is been partial clearing from the right upper lobe, although multiple nodular opacities which are present throughout this area. Nodular opacities are present without both lungs with areas of consolidation throughout the left lung, significantly progressed compared to 3 months prior. There are less confluent nodular opacities scattered throughout the lungs as well, largest measuring approximately 2 cm. 4.  Multiple foci of adenopathy persist. 5. Atherosclerotic calcification in the great vessels and aorta. 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 There are foci of coronary artery calcification. 6. While there is obviously  multifocal pneumonia present, the presence of the multiple nodular opacities raises concern for potential underlying neoplasm. At a minimum, there has been overall progression of pneumonia, particularly throughout the left lung. The adenopathy present was present previously and could have either benign reactive or neoplastic etiology. Given the progression of airspace disease and concern for potential neoplasm, correlation with bronchoscopy may well be warranted. Aortic Atherosclerosis (ICD10-I70.0). Electronically Signed   By: Bretta Bang III M.D.   On: 09/21/2016 13:22  Scheduled Meds: . aspirin EC  81 mg Oral Daily  . cilostazol  100 mg Oral BID  . gabapentin  300 mg Oral QHS  . ipratropium-albuterol  3 mL Nebulization QID  . mouth rinse  15 mL Mouth Rinse BID  . methylPREDNISolone (SOLU-MEDROL) injection  60 mg Intravenous Q12H  . sertraline  200 mg Oral Daily  . tiotropium  18 mcg Inhalation Daily  . traZODone  50 mg Oral QHS  . vitamin B-12  1,000 mcg Oral Daily  . white petrolatum       Continuous Infusions: . sodium chloride 100 mL/hr at 09/21/16 2044  . doxycycline (VIBRAMYCIN) IV 100 mg (09/22/16 1057)  . piperacillin-tazobactam (ZOSYN)  IV 3.375 g (09/22/16 0855)  . vancomycin Stopped (09/22/16 0340)     LOS: 1 day      Katie Moch Annett Gula, MD [facility] Pager [phone]  If 7PM-7AM, please contact night-coverage [email or web address] Password [email or web address] 09/22/2016, 11:35 AM

## 2016-09-22 NOTE — Care Management Note (Signed)
Case Management Note Marvetta Gibbons RN, BSN Unit 4E-Case Manager [phone]  Patient Details  Name: William Kirk MRN: [id number] Date of Birth: 10/02/1940  Subjective/Objective:   Pt admitted with PNA/sepsis- acute resp. failure                 Action/Plan: PTA pt lived at home with wife- is followed by PCP at the [facility] clinic- is not on home 02, is independent at baseline-  CM will follow for d/c needs   Expected Discharge Date:                  Expected Discharge Plan:  Home/Self Care  In-House Referral:     Discharge planning Services  CM Consult  Post Acute Care Choice:    Choice offered to:     DME Arranged:    DME Agency:     HH Arranged:    [place] Agency:     Status of Service:  In process, will continue to follow  If discussed at Long Length of Stay Meetings, dates discussed:    Discharge Disposition:   Additional Comments:  Dawayne Patricia, RN 09/22/2016, 12:51 PM

## 2016-09-22 NOTE — Progress Notes (Signed)
Modified Barium Swallow Progress Note  Patient Details  Name: William Kirk MRN: [id number] Date of Birth: August 07, 1940  Today's Date: 09/22/2016  Modified Barium Swallow completed.  Full report located under Chart Review in the Imaging Section.  Brief recommendations include the following:  Clinical Impression  Pt exhibits combination of a motor and respiratory based dysphagia with discoordinated swallow sequence and late elevation of laryngx and epiglottic movement. Thin and nectar passed below vocal cords without awareness during the swallow. Chin tuck did not prevent penetration with thin or nectar but lessened with nectar. Verbal cues to cough did not fully clear laryngeal vestibule with mild amount remaining on backside of upper epiglottis. Obvious dyspnea throughout study. Delayed mastication with solid. He continues to be a a mod-severe aspiration risk even with recommendations (pt also not fully compliant with rec's from the [place]). Recommend Dys 3, nectar thick, chin tuck with all liquids, no straws, volitional cough after 2 sips, pills whole in applesauce. Will continue to follow. He may benefit from respiratory muscle strength training if not contraindicated.       Swallow Evaluation Recommendations       SLP Diet Recommendations: Nectar thick liquid;Dysphagia 3 (Mech soft) solids   Liquid Administration via: No straw;Cup   Medication Administration: Whole meds with puree   Supervision: Intermittent supervision to cue for compensatory strategies;Patient able to self feed   Compensations: Slow rate;Small sips/bites;Chin tuck (chin tuck with liquids, cough after every 2 sips)   Postural Changes: Remain semi-upright after after feeds/meals (Comment);Seated upright at 90 degrees   Oral Care Recommendations: Oral care BID        Houston Siren 09/22/2016,2:08 PM  Orbie Pyo Colvin Caroli.Ed [employer] [phone]

## 2016-09-22 NOTE — Progress Notes (Addendum)
Initial Nutrition Assessment  DOCUMENTATION CODES:   Severe malnutrition in context of chronic illness  INTERVENTION:    Magic cup TID with meals, each supplement provides 290 kcal and 9 grams of protein  M[employer] II TID between meals, each supplement provides 480-500 kcals and 20-23 grams of protein  NUTRITION DIAGNOSIS:   Malnutrition (severe) related to chronic illness (COPD) as evidenced by severe depletion of body fat, severe depletion of muscle mass.  GOAL:   Patient will meet greater than or equal to 90% of their needs  MONITOR:   PO intake, Supplement acceptance  REASON FOR ASSESSMENT:   Consult Assessment of nutrition requirement/status  ASSESSMENT:   76 yo male with PMH of PVD, COPD, PNA, bleeding stomach ulcer, arthritis, and depression, who was admitted on 8/15 with COPD exacerbation.  Patient reports that he has been eating poorly for a long time. He c/o no appetite. He has been drinking Boost Plus supplements 3-4 times per day at home, twice per day the Boost is mixed with ice cream. He agreed to try [employer] and [employer] supplements.  Nutrition-Focused physical exam completed. Findings are severe fat depletion, severe muscle depletion, and no edema.   7% weight loss in 3 months per weight hx in EMR.  S/P MBS with SLP today; diet has been advanced to dysphagia 3 with nectar thick liquids.  Labs reviewed: potassium 2.7 (L) Medications reviewed and include vitamin B-12.  Diet Order:  DIET DYS 3 Room service appropriate? Yes; Fluid consistency: Nectar Thick  Skin:  Reviewed, no issues  Last BM:  8/15  Height:   Ht Readings from Last 1 Encounters:  09/21/16 5\' 10"  (1.778 m)    Weight:   Wt Readings from Last 1 Encounters:  09/22/16 152 lb 3.2 oz (69 kg)    Ideal Body Weight:  75.5 kg  BMI:  Body mass index is 21.84 kg/m.  Estimated Nutritional Needs:   Kcal:  2100-2300  Protein:  100-110 gm  Fluid:  2.1 L  EDUCATION  NEEDS:   No education needs identified at this time  Molli Barrows, [place], [place], [place] Pager [phone] After Hours Pager [phone]

## 2016-09-23 ENCOUNTER — Inpatient Hospital Stay (HOSPITAL_COMMUNITY): Payer: Non-veteran care

## 2016-09-23 DIAGNOSIS — E876 Hypokalemia: Secondary | ICD-10-CM

## 2016-09-23 DIAGNOSIS — J189 Pneumonia, unspecified organism: Secondary | ICD-10-CM

## 2016-09-23 LAB — LEGIONELLA PNEUMOPHILA SEROGP 1 UR AG: L. pneumophila Serogp 1 Ur Ag: NEGATIVE

## 2016-09-23 LAB — BASIC METABOLIC PANEL
Anion gap: 9 (ref 5–15)
BUN: 21 mg/dL — AB (ref 6–20)
CHLORIDE: 104 mmol/L (ref 101–111)
CO2: 25 mmol/L (ref 22–32)
Calcium: 8.4 mg/dL — ABNORMAL LOW (ref 8.9–10.3)
Creatinine, Ser: 0.59 mg/dL — ABNORMAL LOW (ref 0.61–1.24)
GFR calc Af Amer: >60 mL/min (ref >60)
GFR calc non Af Amer: >60 mL/min (ref >60)
Glucose, Bld: 96 mg/dL (ref 65–99)
POTASSIUM: 2.8 mmol/L — AB (ref 3.5–5.1)
SODIUM: 138 mmol/L (ref 135–145)

## 2016-09-23 MED ORDER — POTASSIUM CHLORIDE 20 MEQ PO PACK
40.0000 meq | PACK | ORAL | Status: AC
  Administered 2016-09-23 (×2): 40 meq via ORAL
  Filled 2016-09-23 (×3): qty 2

## 2016-09-23 MED ORDER — IPRATROPIUM-ALBUTEROL 0.5-2.5 (3) MG/3ML IN SOLN: 3.0000 mL | RESPIRATORY_TRACT | Status: DC | PRN

## 2016-09-23 MED ORDER — RESOURCE THICKENUP CLEAR PO POWD: ORAL | Status: DC | PRN

## 2016-09-23 MED ORDER — IPRATROPIUM-ALBUTEROL 0.5-2.5 (3) MG/3ML IN SOLN
3.0000 mL | Freq: Four times a day (QID) | RESPIRATORY_TRACT | Status: DC
  Administered 2016-09-23 – 2016-09-24 (×2): 3 mL via RESPIRATORY_TRACT
  Filled 2016-09-23 (×3): qty 3

## 2016-09-23 MED ORDER — RESOURCE THICKENUP CLEAR PO POWD
ORAL | Status: DC | PRN
  Filled 2016-09-23 (×2): qty 125

## 2016-09-23 MED ORDER — METHYLPREDNISOLONE SODIUM SUCC 40 MG IJ SOLR
40.0000 mg | Freq: Every day | INTRAMUSCULAR | Status: DC
  Administered 2016-09-24 – 2016-09-25 (×2): 40 mg via INTRAVENOUS
  Filled 2016-09-23 (×2): qty 1

## 2016-09-23 NOTE — Progress Notes (Signed)
Physical Therapy Treatment Patient Details Name: William Kirk MRN: [id number] DOB: Aug 16, 1940 Today's Date: 09/23/2016    History of Present Illness This 76 y.o. male admitted with SOB and weight loss.  CT angio negative for PE, but showed multifocal PNA and multiple nodular opacities raising concern for potential underlying neoplasm.   PMH includes:  COPS, h/o PNA, PVD; s/p Rt THA    PT Comments    Patient making improvements with mobility and gait.  O2 sats improved with gait today (on O2).   Follow Up Recommendations  Home health PT;Supervision/Assistance - 24 hour     Equipment Recommendations  None recommended by PT    Recommendations for Other Services       Precautions / Restrictions Precautions Precautions: Fall Precaution Comments: Watch SaO2 during mobility Restrictions Weight Bearing Restrictions: No    Mobility  Bed Mobility Overal bed mobility: Independent                Transfers Overall transfer level: Needs assistance Equipment used: Rolling walker (2 wheeled) Transfers: Sit to/from Stand Sit to Stand: Supervision         General transfer comment: Supervision for safety.  Ambulation/Gait Ambulation/Gait assistance: Min guard Ambulation Distance (Feet): 48 Feet (24' x 2 with seated rest break) Assistive device: Rolling walker (2 wheeled) Gait Pattern/deviations: Step-through pattern;Decreased stride length;Shuffle;Trunk flexed Gait velocity: decreased Gait velocity interpretation: Below normal speed for age/gender General Gait Details: Patient with slow gait speed and flexed trunk during gait.  Patient ambulated 24' with SaO2 drop to 88%.  After rest, patient ambulated 24' with SaO2 remaining at 93%.  Improved balance with use of RW.   Stairs            Wheelchair Mobility    Modified Rankin (Stroke Patients Only)       Balance           Standing balance support: No upper extremity supported Standing balance-Leahy  Scale: Fair                              Cognition Arousal/Alertness: Awake/alert Behavior During Therapy: WFL for tasks assessed/performed Overall Cognitive Status: Within Functional Limits for tasks assessed                                        Exercises      General Comments        Pertinent Vitals/Pain Pain Assessment: No/denies pain    Home Living                      Prior Function            PT Goals (current goals can now be found in the care plan section) Acute Rehab PT Goals Patient Stated Goal: To get back to normal  Progress towards PT goals: Progressing toward goals    Frequency    Min 3X/week      PT Plan Current plan remains appropriate    Co-evaluation              AM-PAC PT "6 Clicks" Daily Activity  Outcome Measure  Difficulty turning over in bed (including adjusting bedclothes, sheets and blankets)?: None Difficulty moving from lying on back to sitting on the side of the bed? : None Difficulty sitting down on and standing up from  a chair with arms (e.g., wheelchair, bedside commode, etc,.)?: None Help needed moving to and from a bed to chair (including a wheelchair)?: A Little Help needed walking in hospital room?: A Lot Help needed climbing 3-5 steps with a railing? : Total 6 Click Score: 18    End of Session Equipment Utilized During Treatment: Gait belt;Oxygen Activity Tolerance: Patient limited by fatigue Patient left: in bed;with call bell/phone within reach;with bed alarm set (Had been up in chair earlier, and declined now.) Nurse Communication: Mobility status (SaO2) PT Visit Diagnosis: Muscle weakness (generalized) (M62.81);Unsteadiness on feet (R26.81)     Time: 2202-5427 PT Time Calculation (min) (ACUTE ONLY): 18 min  Charges:  $Gait Training: 8-22 mins                    G Codes:       William Hurt. Renaldo Fiddler, [facility] Acute Rehab Services Pager [phone]    Vena Austria 09/23/2016, 9:42 PM

## 2016-09-23 NOTE — Progress Notes (Signed)
to the right of the distal trachea near the carina measuring 2.4 x 1.6 cm. There is a lymph node to the left of the distal trachea measuring 1.9 x 1.5 cm. There is a right hilar lymph node measuring 2.1 x 1.4 cm. There is a left hilar lymph node measuring 1.7 x 1.8 cm. There are several prominent sub- carinal lymph nodes. The largest individual sub- carinal lymph node measures 2.2 x 1.8 cm. There are several sub- carinal lymph nodes in this area of similar size. There are several scattered subcentimeter lymph nodes throughout the chest as well. No esophageal lesions are evident. Lungs/Pleura: There is consolidation throughout most of the right lower lobe, more pronounced than on prior study. There is extensive multifocal airspace consolidation throughout the left lower lobe, significantly more severe than on prior CT examination 3 months prior. There are patchy areas of airspace consolidation throughout both upper lobes, with multiple areas of nodular appearance throughout the upper lobes, more severe on the right than on the left. There is less consolidation overall in the right upper lobe compared to 3 months prior. There are nodular areas in the right middle lobe as well. Largest individual well-defined nodular lesion is seen in the lateral segment of the right middle lobe measuring 1.9 x 1.6 cm. There is no appreciable pleural effusion. Upper Abdomen: In the visualized upper abdomen,  there is aortic atherosclerosis. Visualized upper abdominal structures otherwise appear unremarkable. Note that adrenals are incompletely visualized. Musculoskeletal: There is degenerative change in the thoracic spine. There are no blastic or lytic bone lesions. Review of the MIP images confirms the above findings. IMPRESSION: 1.  No demonstrable pulmonary embolus. 2. There is now consolidation with significant collapse of the right lower lobe. 3. Multiple foci of airspace opacity bilaterally. There is been partial clearing from the right upper lobe, although multiple nodular opacities which are present throughout this area. Nodular opacities are present without both lungs with areas of consolidation throughout the left lung, significantly progressed compared to 3 months prior. There are less confluent nodular opacities scattered throughout the lungs as well, largest measuring approximately 2 cm. 4.  Multiple foci of adenopathy persist. 5. Atherosclerotic calcification in the great vessels and aorta. 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 There are foci of coronary artery calcification. 6. While there is obviously multifocal pneumonia present, the presence of the multiple nodular opacities raises concern for potential underlying neoplasm. At a minimum, there has been overall progression of pneumonia, particularly throughout the left lung. The adenopathy present was present previously and could have either benign reactive or neoplastic etiology. Given the progression of airspace disease and concern for potential neoplasm, correlation with bronchoscopy may well be warranted. Aortic Atherosclerosis (ICD10-I70.0). Electronically Signed   By: Lowella Grip III M.D.   On: 09/21/2016 13:22   Dg Swallowing Func-speech Pathology  Result Date: 09/22/2016 Objective Swallowing Evaluation: Type of Study: MBS-Modified Barium Swallow Study Patient Details Name: William Kirk MRN: [id number] Date of Birth: Mar 14, 1940 Today's Date: 09/22/2016 Time: SLP Start Time (ACUTE ONLY): 1135-SLP Stop Time (ACUTE ONLY): 1155 SLP Time Calculation (min) (ACUTE ONLY): 20 min Past Medical History: Past Medical History: Diagnosis Date . Arthritis   "hands" (09/21/2016) . Bleeding stomach ulcer 2000s . COPD (chronic obstructive pulmonary disease) ([place])  . Depression  . Pneumonia   "now and several times before this" (09/21/2016) . PVD (peripheral vascular disease) ([place]) 2014  prev PTCA on RLE Past Surgical History: Past Surgical  to the right of the distal trachea near the carina measuring 2.4 x 1.6 cm. There is a lymph node to the left of the distal trachea measuring 1.9 x 1.5 cm. There is a right hilar lymph node measuring 2.1 x 1.4 cm. There is a left hilar lymph node measuring 1.7 x 1.8 cm. There are several prominent sub- carinal lymph nodes. The largest individual sub- carinal lymph node measures 2.2 x 1.8 cm. There are several sub- carinal lymph nodes in this area of similar size. There are several scattered subcentimeter lymph nodes throughout the chest as well. No esophageal lesions are evident. Lungs/Pleura: There is consolidation throughout most of the right lower lobe, more pronounced than on prior study. There is extensive multifocal airspace consolidation throughout the left lower lobe, significantly more severe than on prior CT examination 3 months prior. There are patchy areas of airspace consolidation throughout both upper lobes, with multiple areas of nodular appearance throughout the upper lobes, more severe on the right than on the left. There is less consolidation overall in the right upper lobe compared to 3 months prior. There are nodular areas in the right middle lobe as well. Largest individual well-defined nodular lesion is seen in the lateral segment of the right middle lobe measuring 1.9 x 1.6 cm. There is no appreciable pleural effusion. Upper Abdomen: In the visualized upper abdomen,  there is aortic atherosclerosis. Visualized upper abdominal structures otherwise appear unremarkable. Note that adrenals are incompletely visualized. Musculoskeletal: There is degenerative change in the thoracic spine. There are no blastic or lytic bone lesions. Review of the MIP images confirms the above findings. IMPRESSION: 1.  No demonstrable pulmonary embolus. 2. There is now consolidation with significant collapse of the right lower lobe. 3. Multiple foci of airspace opacity bilaterally. There is been partial clearing from the right upper lobe, although multiple nodular opacities which are present throughout this area. Nodular opacities are present without both lungs with areas of consolidation throughout the left lung, significantly progressed compared to 3 months prior. There are less confluent nodular opacities scattered throughout the lungs as well, largest measuring approximately 2 cm. 4.  Multiple foci of adenopathy persist. 5. Atherosclerotic calcification in the great vessels and aorta. 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 There are foci of coronary artery calcification. 6. While there is obviously multifocal pneumonia present, the presence of the multiple nodular opacities raises concern for potential underlying neoplasm. At a minimum, there has been overall progression of pneumonia, particularly throughout the left lung. The adenopathy present was present previously and could have either benign reactive or neoplastic etiology. Given the progression of airspace disease and concern for potential neoplasm, correlation with bronchoscopy may well be warranted. Aortic Atherosclerosis (ICD10-I70.0). Electronically Signed   By: Lowella Grip III M.D.   On: 09/21/2016 13:22   Dg Swallowing Func-speech Pathology  Result Date: 09/22/2016 Objective Swallowing Evaluation: Type of Study: MBS-Modified Barium Swallow Study Patient Details Name: William Kirk MRN: [id number] Date of Birth: Mar 14, 1940 Today's Date: 09/22/2016 Time: SLP Start Time (ACUTE ONLY): 1135-SLP Stop Time (ACUTE ONLY): 1155 SLP Time Calculation (min) (ACUTE ONLY): 20 min Past Medical History: Past Medical History: Diagnosis Date . Arthritis   "hands" (09/21/2016) . Bleeding stomach ulcer 2000s . COPD (chronic obstructive pulmonary disease) ([place])  . Depression  . Pneumonia   "now and several times before this" (09/21/2016) . PVD (peripheral vascular disease) ([place]) 2014  prev PTCA on RLE Past Surgical History: Past Surgical  PROGRESS NOTE    MONTERRIO GERST  LGX:[id number] DOB: 1940-10-31 DOA: 09/21/2016 PCP: [facility]   Brief Narrative:  76 year old male presented with productive cough, dyspnea and weight loss. Patient is known to have COPD, tobacco abuse, arthritis, and depression. He developed dyspnea for last 2 days prior to hospitalization, associated with greenish productive sputum, intermittent fevers and generalized weakness. He has been worked up for swallowing dysfunction as an outpatient. On initial physical examination his oxygen saturation was found to be 80% on 6 L per nasal cannula, blood pressure 114/53, heart rate 85-93, respiratory rate 21-30. Moist mucous membranes, heart S1-S2 present rhythmic, no gallops, rubs murmurs, lungs with scattered rails, wheezing and rhonchi bilaterally, abdomen soft nontender, lower extremities no edema. Sodium 136, potassium 2.7, chloride 95, bicarbonate 30, glucose 135, BUN 26, creatinine 0.92, white cell count 9.3, hemoglobin 10.5, hematocrit 33.5, platelets 551. EKG normal sinus rhythm, rate 82 bpm, normal axis, normal intervals. His x-ray with new left upper lobe infiltrate, alveolar, soft lung volume right lower lobe. CT chest with no pulmonary embolism, collapse of the right lower lobe, left upper and lower lobe infiltrates.   Patient was admitted to the hospital working diagnosis of acute hypoxic failure due to multifocal pneumonia.  Assessment & Plan:   Principal Problem:   Acute respiratory failure with hypoxia (HCC) Active Problems:   Multifocal pneumonia   COPD with acute exacerbation (HCC)   Hypokalemia   Protein-calorie malnutrition, severe (HCC)   HCAP (healthcare-associated pneumonia)   Depression   Aspiration pneumonia (HCC)   HTN (hypertension)   1. Hypoxic respiratory failure, acute. Dyspnea improved, follow chest film with no worsening infiltrates, will continue aggressive chest pt, with flutter valve, incentive spirometer and  hypertonic saline, will continue bronchodilator therapy. Continue to be on high flow nasal cannula, but able to tolerate lower flow down to 8 liters per minute at rest. Will continue to wean down supplemental 02 per [place], continue aspiration precautions.   2. Multilobar pneumonia, predominantly left upper lobe, present on admission. Mechanical soft diet, will continue Zosyn for antibiotic therapy, will follow cell count in am, on admission up to 19, will continue to follow on cultures and temperature curve. Will decrease dose of systemic steroids.   3. Hypokalemia. Serum K down to 2, 8. Will replete with kcl 80 meq in 2 divided doses, will follow on renal panel in am, avoid hypotension or nephrotoxic medications.  4. Calorie protein malnutrition. Will follow on dietary recommendations.   5. Hypertension.  Hydralazine prn, blood pressure systolic 814 to 481  6. Depression/anxiety. Trazodone and sertraline. No confusion or agitation.    DVT prophylaxis: enoxaparin  Code Status: full  Family Communication:  Disposition Plan:    Consultants:     Procedures:     Antimicrobials:   Vancomycin  Zosyn   Subjective: Patient feeling better, dyspnea continue to improve, no nausea or vomiting, no chest pain. Tolerating po well, out of bed with physical therapy.   Objective: Vitals:   09/23/16 0400 09/23/16 0739 09/23/16 0850 09/23/16 1118  BP: (!) 108/38 (!) 134/56 134/62 132/61  Pulse: 67 69 72 72  Resp: 19 (!) 29 (!) 26 18  Temp: 97.6 F (36.4 C) 98 F (36.7 C)  97.6 F (36.4 C)  TempSrc: Oral Oral  Oral  SpO2: 98% 98% 93% 95%  Weight:      Height:        Intake/Output Summary (Last 24 hours) at 09/23/16 1301 Last data filed at  to the right of the distal trachea near the carina measuring 2.4 x 1.6 cm. There is a lymph node to the left of the distal trachea measuring 1.9 x 1.5 cm. There is a right hilar lymph node measuring 2.1 x 1.4 cm. There is a left hilar lymph node measuring 1.7 x 1.8 cm. There are several prominent sub- carinal lymph nodes. The largest individual sub- carinal lymph node measures 2.2 x 1.8 cm. There are several sub- carinal lymph nodes in this area of similar size. There are several scattered subcentimeter lymph nodes throughout the chest as well. No esophageal lesions are evident. Lungs/Pleura: There is consolidation throughout most of the right lower lobe, more pronounced than on prior study. There is extensive multifocal airspace consolidation throughout the left lower lobe, significantly more severe than on prior CT examination 3 months prior. There are patchy areas of airspace consolidation throughout both upper lobes, with multiple areas of nodular appearance throughout the upper lobes, more severe on the right than on the left. There is less consolidation overall in the right upper lobe compared to 3 months prior. There are nodular areas in the right middle lobe as well. Largest individual well-defined nodular lesion is seen in the lateral segment of the right middle lobe measuring 1.9 x 1.6 cm. There is no appreciable pleural effusion. Upper Abdomen: In the visualized upper abdomen,  there is aortic atherosclerosis. Visualized upper abdominal structures otherwise appear unremarkable. Note that adrenals are incompletely visualized. Musculoskeletal: There is degenerative change in the thoracic spine. There are no blastic or lytic bone lesions. Review of the MIP images confirms the above findings. IMPRESSION: 1.  No demonstrable pulmonary embolus. 2. There is now consolidation with significant collapse of the right lower lobe. 3. Multiple foci of airspace opacity bilaterally. There is been partial clearing from the right upper lobe, although multiple nodular opacities which are present throughout this area. Nodular opacities are present without both lungs with areas of consolidation throughout the left lung, significantly progressed compared to 3 months prior. There are less confluent nodular opacities scattered throughout the lungs as well, largest measuring approximately 2 cm. 4.  Multiple foci of adenopathy persist. 5. Atherosclerotic calcification in the great vessels and aorta. 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 There are foci of coronary artery calcification. 6. While there is obviously multifocal pneumonia present, the presence of the multiple nodular opacities raises concern for potential underlying neoplasm. At a minimum, there has been overall progression of pneumonia, particularly throughout the left lung. The adenopathy present was present previously and could have either benign reactive or neoplastic etiology. Given the progression of airspace disease and concern for potential neoplasm, correlation with bronchoscopy may well be warranted. Aortic Atherosclerosis (ICD10-I70.0). Electronically Signed   By: Lowella Grip III M.D.   On: 09/21/2016 13:22   Dg Swallowing Func-speech Pathology  Result Date: 09/22/2016 Objective Swallowing Evaluation: Type of Study: MBS-Modified Barium Swallow Study Patient Details Name: William Kirk MRN: [id number] Date of Birth: Mar 14, 1940 Today's Date: 09/22/2016 Time: SLP Start Time (ACUTE ONLY): 1135-SLP Stop Time (ACUTE ONLY): 1155 SLP Time Calculation (min) (ACUTE ONLY): 20 min Past Medical History: Past Medical History: Diagnosis Date . Arthritis   "hands" (09/21/2016) . Bleeding stomach ulcer 2000s . COPD (chronic obstructive pulmonary disease) ([place])  . Depression  . Pneumonia   "now and several times before this" (09/21/2016) . PVD (peripheral vascular disease) ([place]) 2014  prev PTCA on RLE Past Surgical History: Past Surgical  to the right of the distal trachea near the carina measuring 2.4 x 1.6 cm. There is a lymph node to the left of the distal trachea measuring 1.9 x 1.5 cm. There is a right hilar lymph node measuring 2.1 x 1.4 cm. There is a left hilar lymph node measuring 1.7 x 1.8 cm. There are several prominent sub- carinal lymph nodes. The largest individual sub- carinal lymph node measures 2.2 x 1.8 cm. There are several sub- carinal lymph nodes in this area of similar size. There are several scattered subcentimeter lymph nodes throughout the chest as well. No esophageal lesions are evident. Lungs/Pleura: There is consolidation throughout most of the right lower lobe, more pronounced than on prior study. There is extensive multifocal airspace consolidation throughout the left lower lobe, significantly more severe than on prior CT examination 3 months prior. There are patchy areas of airspace consolidation throughout both upper lobes, with multiple areas of nodular appearance throughout the upper lobes, more severe on the right than on the left. There is less consolidation overall in the right upper lobe compared to 3 months prior. There are nodular areas in the right middle lobe as well. Largest individual well-defined nodular lesion is seen in the lateral segment of the right middle lobe measuring 1.9 x 1.6 cm. There is no appreciable pleural effusion. Upper Abdomen: In the visualized upper abdomen,  there is aortic atherosclerosis. Visualized upper abdominal structures otherwise appear unremarkable. Note that adrenals are incompletely visualized. Musculoskeletal: There is degenerative change in the thoracic spine. There are no blastic or lytic bone lesions. Review of the MIP images confirms the above findings. IMPRESSION: 1.  No demonstrable pulmonary embolus. 2. There is now consolidation with significant collapse of the right lower lobe. 3. Multiple foci of airspace opacity bilaterally. There is been partial clearing from the right upper lobe, although multiple nodular opacities which are present throughout this area. Nodular opacities are present without both lungs with areas of consolidation throughout the left lung, significantly progressed compared to 3 months prior. There are less confluent nodular opacities scattered throughout the lungs as well, largest measuring approximately 2 cm. 4.  Multiple foci of adenopathy persist. 5. Atherosclerotic calcification in the great vessels and aorta. 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 There are foci of coronary artery calcification. 6. While there is obviously multifocal pneumonia present, the presence of the multiple nodular opacities raises concern for potential underlying neoplasm. At a minimum, there has been overall progression of pneumonia, particularly throughout the left lung. The adenopathy present was present previously and could have either benign reactive or neoplastic etiology. Given the progression of airspace disease and concern for potential neoplasm, correlation with bronchoscopy may well be warranted. Aortic Atherosclerosis (ICD10-I70.0). Electronically Signed   By: Lowella Grip III M.D.   On: 09/21/2016 13:22   Dg Swallowing Func-speech Pathology  Result Date: 09/22/2016 Objective Swallowing Evaluation: Type of Study: MBS-Modified Barium Swallow Study Patient Details Name: William Kirk MRN: [id number] Date of Birth: Mar 14, 1940 Today's Date: 09/22/2016 Time: SLP Start Time (ACUTE ONLY): 1135-SLP Stop Time (ACUTE ONLY): 1155 SLP Time Calculation (min) (ACUTE ONLY): 20 min Past Medical History: Past Medical History: Diagnosis Date . Arthritis   "hands" (09/21/2016) . Bleeding stomach ulcer 2000s . COPD (chronic obstructive pulmonary disease) ([place])  . Depression  . Pneumonia   "now and several times before this" (09/21/2016) . PVD (peripheral vascular disease) ([place]) 2014  prev PTCA on RLE Past Surgical History: Past Surgical  PROGRESS NOTE    MONTERRIO GERST  LGX:[id number] DOB: 1940-10-31 DOA: 09/21/2016 PCP: [facility]   Brief Narrative:  76 year old male presented with productive cough, dyspnea and weight loss. Patient is known to have COPD, tobacco abuse, arthritis, and depression. He developed dyspnea for last 2 days prior to hospitalization, associated with greenish productive sputum, intermittent fevers and generalized weakness. He has been worked up for swallowing dysfunction as an outpatient. On initial physical examination his oxygen saturation was found to be 80% on 6 L per nasal cannula, blood pressure 114/53, heart rate 85-93, respiratory rate 21-30. Moist mucous membranes, heart S1-S2 present rhythmic, no gallops, rubs murmurs, lungs with scattered rails, wheezing and rhonchi bilaterally, abdomen soft nontender, lower extremities no edema. Sodium 136, potassium 2.7, chloride 95, bicarbonate 30, glucose 135, BUN 26, creatinine 0.92, white cell count 9.3, hemoglobin 10.5, hematocrit 33.5, platelets 551. EKG normal sinus rhythm, rate 82 bpm, normal axis, normal intervals. His x-ray with new left upper lobe infiltrate, alveolar, soft lung volume right lower lobe. CT chest with no pulmonary embolism, collapse of the right lower lobe, left upper and lower lobe infiltrates.   Patient was admitted to the hospital working diagnosis of acute hypoxic failure due to multifocal pneumonia.  Assessment & Plan:   Principal Problem:   Acute respiratory failure with hypoxia (HCC) Active Problems:   Multifocal pneumonia   COPD with acute exacerbation (HCC)   Hypokalemia   Protein-calorie malnutrition, severe (HCC)   HCAP (healthcare-associated pneumonia)   Depression   Aspiration pneumonia (HCC)   HTN (hypertension)   1. Hypoxic respiratory failure, acute. Dyspnea improved, follow chest film with no worsening infiltrates, will continue aggressive chest pt, with flutter valve, incentive spirometer and  hypertonic saline, will continue bronchodilator therapy. Continue to be on high flow nasal cannula, but able to tolerate lower flow down to 8 liters per minute at rest. Will continue to wean down supplemental 02 per [place], continue aspiration precautions.   2. Multilobar pneumonia, predominantly left upper lobe, present on admission. Mechanical soft diet, will continue Zosyn for antibiotic therapy, will follow cell count in am, on admission up to 19, will continue to follow on cultures and temperature curve. Will decrease dose of systemic steroids.   3. Hypokalemia. Serum K down to 2, 8. Will replete with kcl 80 meq in 2 divided doses, will follow on renal panel in am, avoid hypotension or nephrotoxic medications.  4. Calorie protein malnutrition. Will follow on dietary recommendations.   5. Hypertension.  Hydralazine prn, blood pressure systolic 814 to 481  6. Depression/anxiety. Trazodone and sertraline. No confusion or agitation.    DVT prophylaxis: enoxaparin  Code Status: full  Family Communication:  Disposition Plan:    Consultants:     Procedures:     Antimicrobials:   Vancomycin  Zosyn   Subjective: Patient feeling better, dyspnea continue to improve, no nausea or vomiting, no chest pain. Tolerating po well, out of bed with physical therapy.   Objective: Vitals:   09/23/16 0400 09/23/16 0739 09/23/16 0850 09/23/16 1118  BP: (!) 108/38 (!) 134/56 134/62 132/61  Pulse: 67 69 72 72  Resp: 19 (!) 29 (!) 26 18  Temp: 97.6 F (36.4 C) 98 F (36.7 C)  97.6 F (36.4 C)  TempSrc: Oral Oral  Oral  SpO2: 98% 98% 93% 95%  Weight:      Height:        Intake/Output Summary (Last 24 hours) at 09/23/16 1301 Last data filed at

## 2016-09-23 NOTE — Progress Notes (Signed)
Speech Language Pathology Treatment: Dysphagia  Patient Details Name: William Kirk MRN: [id number] DOB: 27-Mar-1940 Today's Date: 09/23/2016 Time: 9528-4132 SLP Time Calculation (min) (ACUTE ONLY): 16 min  Assessment / Plan / Recommendation Clinical Impression  Treatment focused on education re: MBS results and thickening liquids. SLP attempted 2 times prior today. Wife not present when therapist returned. Demonstrated process of thickening to nectar consistency with pt verbalizing understanding (pt with mild confusion). He independently demonstrated chin tuck technique properly without indications of aspiration\. Reviewed importance of continuing recommendations which he did not do after MBS at [place] 2 weeks ago and recommended he return for repeat MBS in approximately one month to assess for potential improvements. Continue Dys 3, nectar with chin tuck for liquids.   HPI HPI: William Kirk a 76 y.o.malewith medical history significant of formal smoker, COPD, pna (May 2018), depression, PVD, arthritis, who presents with productive cough, shortness breath and weight loss (30 pounds since May 2018). His pulmonologist, Dr. Alvan Dame in [facility] scheduled him for "endoscopy" on 10/03/16 due to posssible aspiration. Patient states that he had approximately 30 pounds of weight loss since May. CT angiogram of chest is negative for PE, but showed multifocal pneumonia, and multiple nodular opacities raises concern for potential underlying neoplasm. Pt reports "I eat and it comes back up at least once a day." Pt states he had a MBS 2 weeks ago at [facility] and thinks nectar thick was recommended and mentioned a "chin tuck."      SLP Plan  Continue with current plan of care       Recommendations  Diet recommendations: Dysphagia 3 (mechanical soft);Nectar-thick liquid Liquids provided via: Cup;No straw Medication Administration: Whole meds with puree Supervision: Patient able to self  feed;Full supervision/cueing for compensatory strategies Compensations: Slow rate;Small sips/bites;Chin tuck Postural Changes and/or Swallow Maneuvers: Seated upright 90 degrees                Oral Care Recommendations: Oral care BID Follow up Recommendations: Home health SLP SLP Visit Diagnosis: Dysphagia, pharyngeal phase (R13.13) Plan: Continue with current plan of care       GO                Royce Macadamia 09/23/2016, 4:36 PM   Breck Coons Lonell Face.Ed [employer] [phone]

## 2016-09-23 NOTE — Evaluation (Signed)
Occupational Therapy Evaluation Patient Details Name: William Kirk MRN: [id number] DOB: March 10, 1940 Today's Date: 09/23/2016    History of Present Illness This 76 y.o. male admitted with SOB and weight loss.  CT angio negative for PE, but showed multifocal PNA and multiple nodular opacities raising concern for potential underlying neoplasm.   PMH includes:  COPS, h/o PNA, PVD; s/p Rt THA   Clinical Impression   Pt admitted with above. He demonstrates the below listed deficits and will benefit from continued OT to maximize safety and independence with BADLs.  Pt presents to OT with generalized weakness and decreased activity tolerance.  He requires min A for ADLs.  02 saturation decreased to 85% on 1L HiFlo 02, but he recovers to low 90s with seated rest break and pursed lip breathing.   Will follow acutely.       Follow Up Recommendations  No OT follow up;Supervision - Intermittent    Equipment Recommendations  None recommended by OT    Recommendations for Other Services       Precautions / Restrictions Precautions Precautions: Fall      Mobility Bed Mobility Overal bed mobility: Independent                Transfers Overall transfer level: Needs assistance Equipment used: None Transfers: Sit to/from Stand;Stand Pivot Transfers Sit to Stand: Supervision Stand pivot transfers: Supervision            Balance Overall balance assessment: Needs assistance Sitting-balance support: No upper extremity supported;Feet supported Sitting balance-Leahy Scale: Good     Standing balance support: No upper extremity supported Standing balance-Leahy Scale: Fair Standing balance comment: Pt able to statically stand x 3 mins and x 2 mins with sats decreasing to 85% on 10L Hiflo 02 requiring him to return to seated position                            ADL either performed or assessed with clinical judgement   ADL Overall ADL's : Needs  assistance/impaired Eating/Feeding: Independent   Grooming: Wash/dry hands;Wash/dry face;Oral care;Brushing hair;Set up;Sitting   Upper Body Bathing: Set up;Supervision/ safety;Sitting   Lower Body Bathing: Minimal assistance;Sit to/from stand Lower Body Bathing Details (indicate cue type and reason): due to fatigue  Upper Body Dressing : Set up;Sitting   Lower Body Dressing: Minimal assistance;Sit to/from stand Lower Body Dressing Details (indicate cue type and reason): due to fatigue  Toilet Transfer: Supervision/safety;Stand-pivot;BSC   Toileting- Clothing Manipulation and Hygiene: Supervision/safety;Sit to/from stand       Functional mobility during ADLs: Supervision/safety General ADL Comments: Pt with DOE 3/4 - 4/4 with donning/doffing socks.        Vision         Perception     Praxis      Pertinent Vitals/Pain Pain Assessment: No/denies pain     Hand Dominance Right   Extremity/Trunk Assessment Upper Extremity Assessment Upper Extremity Assessment: Generalized weakness   Lower Extremity Assessment Lower Extremity Assessment: Defer to PT evaluation   Cervical / Trunk Assessment Cervical / Trunk Assessment: Normal   Communication Communication Communication: No difficulties   Cognition Arousal/Alertness: Awake/alert Behavior During Therapy: WFL for tasks assessed/performed Overall Cognitive Status: Within Functional Limits for tasks assessed                                     General  Comments  Encouraged pursed lip breathing     Exercises     Shoulder Instructions      Home Living Family/patient expects to be discharged to:: Private residence Living Arrangements: Spouse/significant other Available Help at Discharge: Family Type of Home: House Home Access: Stairs to enter Secretary/administrator of Steps: 2 Entrance Stairs-Rails: Right;Left;Can reach both Home Layout: One level     Bathroom Shower/Tub: Company secretary: Handicapped height     Home Equipment: Shower seat - built in;Cane - single point;Walker - 2 wheels          Prior Functioning/Environment Level of Independence: Independent        Comments: Pt drives and is active in community         OT Problem List: Decreased strength;Decreased activity tolerance;Impaired balance (sitting and/or standing);Cardiopulmonary status limiting activity      OT Treatment/Interventions: Self-care/ADL training;Therapeutic exercise;DME and/or AE instruction;Energy conservation;Cognitive remediation/compensation;Patient/family education;Balance training    OT Goals(Current goals can be found in the care plan section) Acute Rehab OT Goals Patient Stated Goal: To get back to normal  OT Goal Formulation: With patient Time For Goal Achievement: 10/07/16 Potential to Achieve Goals: Good ADL Goals Pt Will Perform Grooming: with modified independence;standing Pt Will Perform Upper Body Bathing: with modified independence;sitting Pt Will Perform Lower Body Bathing: with modified independence;sit to/from stand Pt Will Perform Upper Body Dressing: with modified independence;sitting Pt Will Perform Lower Body Dressing: with modified independence;sit to/from stand Pt Will Transfer to Toilet: with modified independence;ambulating;regular height toilet;grab bars Pt Will Perform Toileting - Clothing Manipulation and hygiene: with modified independence;sit to/from stand Additional ADL Goal #1: Pt will independently incorporate energy conservation techniques into ADL activities   OT Frequency: Min 2X/week   Barriers to D/C:            Co-evaluation              AM-PAC PT "6 Clicks" Daily Activity     Outcome Measure Help from another person eating meals?: None Help from another person taking care of personal grooming?: A Little Help from another person toileting, which includes using toliet, bedpan, or urinal?: A Little Help from  another person bathing (including washing, rinsing, drying)?: A Little Help from another person to put on and taking off regular upper body clothing?: A Little Help from another person to put on and taking off regular lower body clothing?: A Little 6 Click Score: 19   End of Session Equipment Utilized During Treatment: Oxygen Nurse Communication: Mobility status  Activity Tolerance: Treatment limited secondary to medical complications (Comment) (decreased 02 sats ) Patient left: in bed;with call bell/phone within reach  OT Visit Diagnosis: Unsteadiness on feet (R26.81)                Time: 1610-9604 OT Time Calculation (min): 16 min Charges:  OT General Charges $OT Visit: 1 Procedure OT Evaluation $OT Eval Moderate Complexity: 1 Procedure G-Codes:     Jeani Hawking, OTR/L [phone]   Keyari Kleeman M 09/23/2016, 1:56 PM

## 2016-09-24 LAB — CBC WITH DIFFERENTIAL/PLATELET
BASOS PCT: 0 %
BASOS PCT: 0 %
Basophils Absolute: 0 10*3/uL (ref 0.0–0.1)
Basophils Absolute: 0 10*3/uL (ref 0.0–0.1)
EOS ABS: 0 10*3/uL (ref 0.0–0.7)
EOS ABS: 0 10*3/uL (ref 0.0–0.7)
EOS PCT: 0 %
Eosinophils Relative: 0 %
HCT: 33.6 % — ABNORMAL LOW (ref 39.0–52.0)
HEMATOCRIT: 31.7 % — AB (ref 39.0–52.0)
Hemoglobin: 9.8 g/dL — ABNORMAL LOW (ref 13.0–17.0)
Hemoglobin: 10.7 g/dL — ABNORMAL LOW (ref 13.0–17.0)
LYMPHS ABS: 2.6 10*3/uL (ref 0.7–4.0)
LYMPHS ABS: 0.7 10*3/uL (ref 0.7–4.0)
Lymphocytes Relative: 19 %
Lymphocytes Relative: 5 %
MCH: 23.6 pg — AB (ref 26.0–34.0)
MCH: 24.3 pg — AB (ref 26.0–34.0)
MCHC: 30.9 g/dL (ref 30.0–36.0)
MCHC: 31.8 g/dL (ref 30.0–36.0)
MCV: 76.2 fL — ABNORMAL LOW (ref 78.0–100.0)
MCV: 76.4 fL — AB (ref 78.0–100.0)
MONO ABS: 0.7 10*3/uL (ref 0.1–1.0)
MONO ABS: 0.3 10*3/uL (ref 0.1–1.0)
MONOS PCT: 5 %
Monocytes Relative: 2 %
NEUTROS ABS: 10.3 10*3/uL — AB (ref 1.7–7.7)
NEUTROS ABS: 12.7 10*3/uL — AB (ref 1.7–7.7)
NEUTROS PCT: 93 %
Neutrophils Relative %: 76 %
PLATELETS: 587 10*3/uL — AB (ref 150–400)
Platelets: 565 10*3/uL — ABNORMAL HIGH (ref 150–400)
RBC: 4.16 MIL/uL — ABNORMAL LOW (ref 4.22–5.81)
RBC: 4.4 MIL/uL (ref 4.22–5.81)
RDW: 16.1 % — AB (ref 11.5–15.5)
RDW: 16.1 % — AB (ref 11.5–15.5)
WBC: 13.6 10*3/uL — ABNORMAL HIGH (ref 4.0–10.5)
WBC: 13.7 10*3/uL — ABNORMAL HIGH (ref 4.0–10.5)

## 2016-09-24 LAB — BASIC METABOLIC PANEL WITH GFR
Anion gap: 9 (ref 5–15)
BUN: 18 mg/dL (ref 6–20)
CO2: 24 mmol/L (ref 22–32)
Calcium: 8.6 mg/dL — ABNORMAL LOW (ref 8.9–10.3)
Chloride: 108 mmol/L (ref 101–111)
Creatinine, Ser: 0.6 mg/dL — ABNORMAL LOW (ref 0.61–1.24)
GFR calc Af Amer: >60 mL/min
GFR calc non Af Amer: >60 mL/min
Glucose, Bld: 82 mg/dL (ref 65–99)
Potassium: 3.1 mmol/L — ABNORMAL LOW (ref 3.5–5.1)
Sodium: 141 mmol/L (ref 135–145)

## 2016-09-24 MED ORDER — IPRATROPIUM-ALBUTEROL 0.5-2.5 (3) MG/3ML IN SOLN
3.0000 mL | Freq: Two times a day (BID) | RESPIRATORY_TRACT | Status: DC
  Administered 2016-09-24 – 2016-09-26 (×4): 3 mL via RESPIRATORY_TRACT
  Filled 2016-09-24 (×4): qty 3

## 2016-09-24 MED ORDER — BOOST PLUS PO LIQD
237.0000 mL | Freq: Three times a day (TID) | ORAL | Status: DC
  Administered 2016-09-24 – 2016-09-26 (×2): 237 mL via ORAL
  Filled 2016-09-24 (×11): qty 237

## 2016-09-24 MED ORDER — POTASSIUM CHLORIDE 20 MEQ PO PACK
40.0000 meq | PACK | Freq: Once | ORAL | Status: AC
  Administered 2016-09-24: 40 meq via ORAL
  Filled 2016-09-24: qty 2

## 2016-09-24 MED ORDER — POTASSIUM CHLORIDE 20 MEQ PO PACK: 40.0000 meq | PACK | Freq: Once | ORAL | Status: DC

## 2016-09-24 NOTE — Progress Notes (Signed)
Nutrition Follow-up  DOCUMENTATION CODES:   Severe malnutrition in context of chronic illness  INTERVENTION:  Provide Boost Plus po TID (thicken to appropriate consistency), each supplement provides 360 kcal and 14 grams of protein.   Continue Magic cup TID with meals, each supplement provides 290 kcal and 9 grams of protein.  Encourage adequate PO intake.   NUTRITION DIAGNOSIS:   Malnutrition (severe) related to chronic illness (COPD) as evidenced by severe depletion of body fat, severe depletion of muscle mass; ongoing  GOAL:   Patient will meet greater than or equal to 90% of their needs; progressing  MONITOR:   PO intake, Supplement acceptance  REASON FOR ASSESSMENT:   Consult Assessment of nutrition requirement/status  ASSESSMENT:   76 yo male with PMH of PVD, COPD, PNA, bleeding stomach ulcer, arthritis, and depression, who was admitted on 8/15 with COPD exacerbation.  Meal completion has been 25%. Family has been encouraging pt to eat more at meals, however pt at times would refuse the main dishes. Pt was consuming a magic during time of visit. Family would like Boost Plus ordered. RD to order.  Labs and medications reviewed.   Diet Order:  DIET DYS 3 Room service appropriate? Yes; Fluid consistency: Nectar Thick  Skin:  Reviewed, no issues  Last BM:  8/18  Height:   Ht Readings from Last 1 Encounters:  09/21/16 5\' 10"  (1.778 m)    Weight:   Wt Readings from Last 1 Encounters:  09/22/16 152 lb 3.2 oz (69 kg)    Ideal Body Weight:  75.5 kg  BMI:  Body mass index is 21.84 kg/m.  Estimated Nutritional Needs:   Kcal:  2100-2300  Protein:  100-110 gm  Fluid:  2.1 L  EDUCATION NEEDS:   No education needs identified at this time  Corrin Parker, MS, RD, LDN Pager # [phone] After hours/ weekend pager # [phone]

## 2016-09-24 NOTE — Progress Notes (Signed)
Ref Range Status   Specimen Description BLOOD BLOOD LEFT ARM  Final   Special Requests   Final    BOTTLES DRAWN AEROBIC AND ANAEROBIC Blood Culture adequate volume   Culture   Final    NO GROWTH 2 DAYS Performed at [facility], [address] [place], [place] [address]    Report Status PENDING  Incomplete  Blood culture (routine x 2)     Status: None (Preliminary result)   Collection Time: 09/21/16 11:10 AM  Result Value Ref Range Status   Specimen Description BLOOD BLOOD RIGHT ARM  Final   Special Requests   Final    BOTTLES DRAWN AEROBIC AND ANAEROBIC Blood Culture adequate volume   Culture   Final    NO GROWTH 2 DAYS Performed at [facility], [place] [address], [place],  [address]    Report Status PENDING  Incomplete  Respiratory Panel by PCR     Status: None   Collection Time: 09/21/16  7:49 PM  Result Value Ref Range Status   Adenovirus NOT DETECTED NOT DETECTED Final   Coronavirus 229E NOT DETECTED NOT DETECTED Final   Coronavirus HKU1 NOT DETECTED NOT DETECTED Final   Coronavirus NL63 NOT DETECTED NOT DETECTED  Final   Coronavirus OC43 NOT DETECTED NOT DETECTED Final   Metapneumovirus NOT DETECTED NOT DETECTED Final   Rhinovirus / Enterovirus NOT DETECTED NOT DETECTED Final   Influenza A NOT DETECTED NOT DETECTED Final   Influenza A H1 NOT DETECTED NOT DETECTED Final   Influenza A H1 2009 NOT DETECTED NOT DETECTED Final   Influenza A H3 NOT DETECTED NOT DETECTED Final   Influenza B NOT DETECTED NOT DETECTED Final   Parainfluenza Virus 1 NOT DETECTED NOT DETECTED Final   Parainfluenza Virus 2 NOT DETECTED NOT DETECTED Final   Parainfluenza Virus 3 NOT DETECTED NOT DETECTED Final   Parainfluenza Virus 4 NOT DETECTED NOT DETECTED Final   Respiratory Syncytial Virus NOT DETECTED NOT DETECTED Final   Bordetella pertussis NOT DETECTED NOT DETECTED Final   Chlamydophila pneumoniae NOT DETECTED NOT DETECTED Final   Mycoplasma pneumoniae NOT DETECTED NOT DETECTED Final         Radiology Studies: Dg Chest 2 View  Result Date: 09/23/2016 CLINICAL DATA:  Hypoxia, follow-up pneumonia EXAM: CHEST  2 VIEW COMPARISON:  09/21/2016 FINDINGS: Cardiac shadow is within normal limits. The lungs are well aerated bilaterally. Patchy infiltrates are seen bilaterally but mildly improved when compared with the prior exam. Cardiac shadow is stable. Aortic calcifications are again noted. No sizable pneumothorax is noticed. No acute bony abnormality is noted. IMPRESSION: Patchy infiltrative changes are again identified bilaterally but mildly improved when compared with the prior exam. Electronically Signed   By: Inez Catalina M.D.   On: 09/23/2016 10:33   Dg Swallowing Func-speech Pathology  Result Date: 09/22/2016 Objective Swallowing Evaluation: Type of Study: MBS-Modified Barium Swallow Study Patient Details Name: William Kirk MRN: [id number] Date of Birth: 1940/11/23 Today's Date: 09/22/2016 Time: SLP Start Time (ACUTE ONLY): 1135-SLP Stop Time (ACUTE ONLY): 1155 SLP Time Calculation (min) (ACUTE ONLY): 20 min Past  Medical History: Past Medical History: Diagnosis Date . Arthritis   "hands" (09/21/2016) . Bleeding stomach ulcer 2000s . COPD (chronic obstructive pulmonary disease) ([place])  . Depression  . Pneumonia   "now and several times before this" (09/21/2016) . PVD (peripheral vascular disease) ([place]) 2014  prev PTCA on RLE Past Surgical History: Past Surgical History: Procedure Laterality Date . JOINT  PROGRESS NOTE    William Kirk  PJA:[id number] DOB: 08-04-1940 DOA: 09/21/2016 PCP: [facility]     Brief Narrative: 76 year old male presented with productive cough, dyspnea and weight loss. Patient is known to have COPD, tobacco abuse, arthritis, and depression. He developed dyspnea for last 2 days prior to hospitalization, associated with greenish productive sputum, intermittent fevers and generalized weakness. He has been worked up for swallowing dysfunction as an outpatient. On initial physical examination his oxygen saturation was found to be 80% on 6 L per nasal cannula, blood pressure 114/53, heart rate 85-93, respiratory rate 21-30. Moist mucous membranes, heart S1-S2 present rhythmic, no gallops, rubs murmurs, lungs with scattered rails, wheezing and rhonchi bilaterally, abdomen soft nontender, lower extremities no edema. Sodium 136, potassium 2.7, chloride 95, bicarbonate 30, glucose 135, BUN 26, creatinine 0.92, white cell count 9.3, hemoglobin 10.5, hematocrit 33.5, platelets 551. EKG normal sinus rhythm, rate 82 bpm, normal axis, normal intervals. His x-ray with new left upper lobe infiltrate, alveolar, soft lung volume right lower lobe. CT chest with no pulmonary embolism, collapse of the right lower lobe, left upper and lower lobe infiltrates.   Patient was admitted to the hospital working diagnosis of acute hypoxic failure due to multifocal pneumonia.   Assessment & Plan:   Principal Problem:   Acute respiratory failure with hypoxia (HCC) Active Problems:   Multifocal pneumonia   COPD with acute exacerbation (HCC)   Hypokalemia   Protein-calorie malnutrition, severe (HCC)   HCAP (healthcare-associated pneumonia)   Depression   Aspiration pneumonia (HCC)   HTN (hypertension)   1. Hypoxic respiratory failure, acute. Patient with decrease oxygen requirements, down to 3 LPM [place] from High flow nasal cannula 8 liters, will continue oxymetry monitoring,  aggressive pulmonary toileing with flutter valve, chest pt and hypertonic saline nebs. Out of bed as tolerated. Aspiration precautions.    2. Multilobar aspiration pneumonia, predominantly left upper lobe, present on admission. Continue with mechanical soft diet, IV Zosyn #3 for antibiotic therapy, cultures continue to be no growth, cell count at 13, patient continue afebrile. Short course of systemic steroids.  3. COPD with acute exacerbation. Will continue bronchodilator therapy, oxymetry monitoring and supplemental 02 per  to target 02 saturation above 92%. Will plan short course of systemic steroids, 5 days.   3. Hypokalemia. Serum K up to 3,1. will continue to replete with kcl 40 meq, follow on renal panel in am, renal function preserved with serum cr at 0,60.  4. Severe calorie protein malnutrition. Follow dietary recommendations, with boost plus tid, magic cup tid, and continue to encourage po intake. Will need home health and home pt at discharge, assistance 24 hours.   5. Hypertension. Continue IV hydralazine prn, blood pressure systolic 341 to 937. Clinically normovolemic. Will continue to hold on IV fluids.    6. Depression/anxiety. Continue trazodone and sertraline, per home regimen.    DVT prophylaxis:enoxaparin  Code Status:full  Family Communication: Disposition Plan:   Consultants:    Procedures:    Antimicrobials:   Vancomycin  Zosyn   Subjective: Patient is feeling better, dyspnea still present, worse with exertion, moderate in intensity, improved with oxygen supplementation. No nausea or vomiting.   Objective: Vitals:   09/23/16 2332 09/24/16 0400 09/24/16 0819 09/24/16 0909  BP: (!) 123/58 (!) 144/48    Pulse: 69 77    Resp: (!) 22 20    Temp: 97.8 F (36.6 C) 98.6 F (37 C)  97.6 F (36.4 C)  TempSrc: Oral  PROGRESS NOTE    William Kirk  PJA:[id number] DOB: 08-04-1940 DOA: 09/21/2016 PCP: [facility]     Brief Narrative: 76 year old male presented with productive cough, dyspnea and weight loss. Patient is known to have COPD, tobacco abuse, arthritis, and depression. He developed dyspnea for last 2 days prior to hospitalization, associated with greenish productive sputum, intermittent fevers and generalized weakness. He has been worked up for swallowing dysfunction as an outpatient. On initial physical examination his oxygen saturation was found to be 80% on 6 L per nasal cannula, blood pressure 114/53, heart rate 85-93, respiratory rate 21-30. Moist mucous membranes, heart S1-S2 present rhythmic, no gallops, rubs murmurs, lungs with scattered rails, wheezing and rhonchi bilaterally, abdomen soft nontender, lower extremities no edema. Sodium 136, potassium 2.7, chloride 95, bicarbonate 30, glucose 135, BUN 26, creatinine 0.92, white cell count 9.3, hemoglobin 10.5, hematocrit 33.5, platelets 551. EKG normal sinus rhythm, rate 82 bpm, normal axis, normal intervals. His x-ray with new left upper lobe infiltrate, alveolar, soft lung volume right lower lobe. CT chest with no pulmonary embolism, collapse of the right lower lobe, left upper and lower lobe infiltrates.   Patient was admitted to the hospital working diagnosis of acute hypoxic failure due to multifocal pneumonia.   Assessment & Plan:   Principal Problem:   Acute respiratory failure with hypoxia (HCC) Active Problems:   Multifocal pneumonia   COPD with acute exacerbation (HCC)   Hypokalemia   Protein-calorie malnutrition, severe (HCC)   HCAP (healthcare-associated pneumonia)   Depression   Aspiration pneumonia (HCC)   HTN (hypertension)   1. Hypoxic respiratory failure, acute. Patient with decrease oxygen requirements, down to 3 LPM [place] from High flow nasal cannula 8 liters, will continue oxymetry monitoring,  aggressive pulmonary toileing with flutter valve, chest pt and hypertonic saline nebs. Out of bed as tolerated. Aspiration precautions.    2. Multilobar aspiration pneumonia, predominantly left upper lobe, present on admission. Continue with mechanical soft diet, IV Zosyn #3 for antibiotic therapy, cultures continue to be no growth, cell count at 13, patient continue afebrile. Short course of systemic steroids.  3. COPD with acute exacerbation. Will continue bronchodilator therapy, oxymetry monitoring and supplemental 02 per  to target 02 saturation above 92%. Will plan short course of systemic steroids, 5 days.   3. Hypokalemia. Serum K up to 3,1. will continue to replete with kcl 40 meq, follow on renal panel in am, renal function preserved with serum cr at 0,60.  4. Severe calorie protein malnutrition. Follow dietary recommendations, with boost plus tid, magic cup tid, and continue to encourage po intake. Will need home health and home pt at discharge, assistance 24 hours.   5. Hypertension. Continue IV hydralazine prn, blood pressure systolic 341 to 937. Clinically normovolemic. Will continue to hold on IV fluids.    6. Depression/anxiety. Continue trazodone and sertraline, per home regimen.    DVT prophylaxis:enoxaparin  Code Status:full  Family Communication: Disposition Plan:   Consultants:    Procedures:    Antimicrobials:   Vancomycin  Zosyn   Subjective: Patient is feeling better, dyspnea still present, worse with exertion, moderate in intensity, improved with oxygen supplementation. No nausea or vomiting.   Objective: Vitals:   09/23/16 2332 09/24/16 0400 09/24/16 0819 09/24/16 0909  BP: (!) 123/58 (!) 144/48    Pulse: 69 77    Resp: (!) 22 20    Temp: 97.8 F (36.6 C) 98.6 F (37 C)  97.6 F (36.4 C)  TempSrc: Oral  Ref Range Status   Specimen Description BLOOD BLOOD LEFT ARM  Final   Special Requests   Final    BOTTLES DRAWN AEROBIC AND ANAEROBIC Blood Culture adequate volume   Culture   Final    NO GROWTH 2 DAYS Performed at [facility], [address] [place], [place] [address]    Report Status PENDING  Incomplete  Blood culture (routine x 2)     Status: None (Preliminary result)   Collection Time: 09/21/16 11:10 AM  Result Value Ref Range Status   Specimen Description BLOOD BLOOD RIGHT ARM  Final   Special Requests   Final    BOTTLES DRAWN AEROBIC AND ANAEROBIC Blood Culture adequate volume   Culture   Final    NO GROWTH 2 DAYS Performed at [facility], [place] [address], [place],  [address]    Report Status PENDING  Incomplete  Respiratory Panel by PCR     Status: None   Collection Time: 09/21/16  7:49 PM  Result Value Ref Range Status   Adenovirus NOT DETECTED NOT DETECTED Final   Coronavirus 229E NOT DETECTED NOT DETECTED Final   Coronavirus HKU1 NOT DETECTED NOT DETECTED Final   Coronavirus NL63 NOT DETECTED NOT DETECTED  Final   Coronavirus OC43 NOT DETECTED NOT DETECTED Final   Metapneumovirus NOT DETECTED NOT DETECTED Final   Rhinovirus / Enterovirus NOT DETECTED NOT DETECTED Final   Influenza A NOT DETECTED NOT DETECTED Final   Influenza A H1 NOT DETECTED NOT DETECTED Final   Influenza A H1 2009 NOT DETECTED NOT DETECTED Final   Influenza A H3 NOT DETECTED NOT DETECTED Final   Influenza B NOT DETECTED NOT DETECTED Final   Parainfluenza Virus 1 NOT DETECTED NOT DETECTED Final   Parainfluenza Virus 2 NOT DETECTED NOT DETECTED Final   Parainfluenza Virus 3 NOT DETECTED NOT DETECTED Final   Parainfluenza Virus 4 NOT DETECTED NOT DETECTED Final   Respiratory Syncytial Virus NOT DETECTED NOT DETECTED Final   Bordetella pertussis NOT DETECTED NOT DETECTED Final   Chlamydophila pneumoniae NOT DETECTED NOT DETECTED Final   Mycoplasma pneumoniae NOT DETECTED NOT DETECTED Final         Radiology Studies: Dg Chest 2 View  Result Date: 09/23/2016 CLINICAL DATA:  Hypoxia, follow-up pneumonia EXAM: CHEST  2 VIEW COMPARISON:  09/21/2016 FINDINGS: Cardiac shadow is within normal limits. The lungs are well aerated bilaterally. Patchy infiltrates are seen bilaterally but mildly improved when compared with the prior exam. Cardiac shadow is stable. Aortic calcifications are again noted. No sizable pneumothorax is noticed. No acute bony abnormality is noted. IMPRESSION: Patchy infiltrative changes are again identified bilaterally but mildly improved when compared with the prior exam. Electronically Signed   By: Inez Catalina M.D.   On: 09/23/2016 10:33   Dg Swallowing Func-speech Pathology  Result Date: 09/22/2016 Objective Swallowing Evaluation: Type of Study: MBS-Modified Barium Swallow Study Patient Details Name: William Kirk MRN: [id number] Date of Birth: 1940/11/23 Today's Date: 09/22/2016 Time: SLP Start Time (ACUTE ONLY): 1135-SLP Stop Time (ACUTE ONLY): 1155 SLP Time Calculation (min) (ACUTE ONLY): 20 min Past  Medical History: Past Medical History: Diagnosis Date . Arthritis   "hands" (09/21/2016) . Bleeding stomach ulcer 2000s . COPD (chronic obstructive pulmonary disease) ([place])  . Depression  . Pneumonia   "now and several times before this" (09/21/2016) . PVD (peripheral vascular disease) ([place]) 2014  prev PTCA on RLE Past Surgical History: Past Surgical History: Procedure Laterality Date . JOINT  PROGRESS NOTE    William Kirk  PJA:[id number] DOB: 08-04-1940 DOA: 09/21/2016 PCP: [facility]     Brief Narrative: 76 year old male presented with productive cough, dyspnea and weight loss. Patient is known to have COPD, tobacco abuse, arthritis, and depression. He developed dyspnea for last 2 days prior to hospitalization, associated with greenish productive sputum, intermittent fevers and generalized weakness. He has been worked up for swallowing dysfunction as an outpatient. On initial physical examination his oxygen saturation was found to be 80% on 6 L per nasal cannula, blood pressure 114/53, heart rate 85-93, respiratory rate 21-30. Moist mucous membranes, heart S1-S2 present rhythmic, no gallops, rubs murmurs, lungs with scattered rails, wheezing and rhonchi bilaterally, abdomen soft nontender, lower extremities no edema. Sodium 136, potassium 2.7, chloride 95, bicarbonate 30, glucose 135, BUN 26, creatinine 0.92, white cell count 9.3, hemoglobin 10.5, hematocrit 33.5, platelets 551. EKG normal sinus rhythm, rate 82 bpm, normal axis, normal intervals. His x-ray with new left upper lobe infiltrate, alveolar, soft lung volume right lower lobe. CT chest with no pulmonary embolism, collapse of the right lower lobe, left upper and lower lobe infiltrates.   Patient was admitted to the hospital working diagnosis of acute hypoxic failure due to multifocal pneumonia.   Assessment & Plan:   Principal Problem:   Acute respiratory failure with hypoxia (HCC) Active Problems:   Multifocal pneumonia   COPD with acute exacerbation (HCC)   Hypokalemia   Protein-calorie malnutrition, severe (HCC)   HCAP (healthcare-associated pneumonia)   Depression   Aspiration pneumonia (HCC)   HTN (hypertension)   1. Hypoxic respiratory failure, acute. Patient with decrease oxygen requirements, down to 3 LPM [place] from High flow nasal cannula 8 liters, will continue oxymetry monitoring,  aggressive pulmonary toileing with flutter valve, chest pt and hypertonic saline nebs. Out of bed as tolerated. Aspiration precautions.    2. Multilobar aspiration pneumonia, predominantly left upper lobe, present on admission. Continue with mechanical soft diet, IV Zosyn #3 for antibiotic therapy, cultures continue to be no growth, cell count at 13, patient continue afebrile. Short course of systemic steroids.  3. COPD with acute exacerbation. Will continue bronchodilator therapy, oxymetry monitoring and supplemental 02 per  to target 02 saturation above 92%. Will plan short course of systemic steroids, 5 days.   3. Hypokalemia. Serum K up to 3,1. will continue to replete with kcl 40 meq, follow on renal panel in am, renal function preserved with serum cr at 0,60.  4. Severe calorie protein malnutrition. Follow dietary recommendations, with boost plus tid, magic cup tid, and continue to encourage po intake. Will need home health and home pt at discharge, assistance 24 hours.   5. Hypertension. Continue IV hydralazine prn, blood pressure systolic 341 to 937. Clinically normovolemic. Will continue to hold on IV fluids.    6. Depression/anxiety. Continue trazodone and sertraline, per home regimen.    DVT prophylaxis:enoxaparin  Code Status:full  Family Communication: Disposition Plan:   Consultants:    Procedures:    Antimicrobials:   Vancomycin  Zosyn   Subjective: Patient is feeling better, dyspnea still present, worse with exertion, moderate in intensity, improved with oxygen supplementation. No nausea or vomiting.   Objective: Vitals:   09/23/16 2332 09/24/16 0400 09/24/16 0819 09/24/16 0909  BP: (!) 123/58 (!) 144/48    Pulse: 69 77    Resp: (!) 22 20    Temp: 97.8 F (36.6 C) 98.6 F (37 C)  97.6 F (36.4 C)  TempSrc: Oral

## 2016-09-24 NOTE — Progress Notes (Signed)
Physical Therapy Treatment Patient Details Name: William Kirk MRN: [id number] DOB: November 29, 1940 Today's Date: 09/24/2016    History of Present Illness This 76 y.o. male admitted with SOB and weight loss.  CT angio negative for PE, but showed multifocal PNA and multiple nodular opacities raising concern for potential underlying neoplasm.   PMH includes:  COPS, h/o PNA, PVD; s/p Rt THA    PT Comments    Patient continues to improve with gait and mobility.  Will try stairs in am.   Follow Up Recommendations  Home health PT;Supervision/Assistance - 24 hour     Equipment Recommendations  None recommended by PT    Recommendations for Other Services       Precautions / Restrictions Precautions Precautions: Fall Precaution Comments: Watch SaO2 during mobility Restrictions Weight Bearing Restrictions: No    Mobility  Bed Mobility Overal bed mobility: Independent                Transfers Overall transfer level: Modified independent Equipment used: Rolling walker (2 wheeled) Transfers: Sit to/from Stand Sit to Stand: Modified independent (Device/Increase time)         General transfer comment: No physical assist or assist for safety needed.  Ambulation/Gait Ambulation/Gait assistance: Supervision Ambulation Distance (Feet): 110 Feet (110 x 2 with seated rest break) Assistive device: Rolling walker (2 wheeled) Gait Pattern/deviations: Step-through pattern;Decreased stride length;Shuffle;Trunk flexed Gait velocity: decreased Gait velocity interpretation: Below normal speed for age/gender General Gait Details: Patient with slow but steady gait with RW.  No physical assist needed.  Patient's SaO2 remained from 90-94% during gait on 3L of O2.   Stairs            Wheelchair Mobility    Modified Rankin (Stroke Patients Only)       Balance             Standing balance-Leahy Scale: Fair                              Cognition  Arousal/Alertness: Awake/alert Behavior During Therapy: WFL for tasks assessed/performed Overall Cognitive Status: Within Functional Limits for tasks assessed                                        Exercises      General Comments        Pertinent Vitals/Pain Pain Assessment: No/denies pain    Home Living                      Prior Function            PT Goals (current goals can now be found in the care plan section) Acute Rehab PT Goals Patient Stated Goal: To get back to normal  Progress towards PT goals: Progressing toward goals    Frequency    Min 3X/week      PT Plan Current plan remains appropriate    Co-evaluation              AM-PAC PT "6 Clicks" Daily Activity  Outcome Measure  Difficulty turning over in bed (including adjusting bedclothes, sheets and blankets)?: None Difficulty moving from lying on back to sitting on the side of the bed? : None Difficulty sitting down on and standing up from a chair with arms (e.g., wheelchair, bedside commode, etc,.)?: None Help needed  moving to and from a bed to chair (including a wheelchair)?: A Little Help needed walking in hospital room?: A Little Help needed climbing 3-5 steps with a railing? : A Little 6 Click Score: 21    End of Session Equipment Utilized During Treatment: Gait belt;Oxygen Activity Tolerance: Patient tolerated treatment well Patient left: in bed;with call bell/phone within reach;with bed alarm set Nurse Communication: Mobility status PT Visit Diagnosis: Muscle weakness (generalized) (M62.81);Unsteadiness on feet (R26.81)     Time: 5621-3086 PT Time Calculation (min) (ACUTE ONLY): 17 min  Charges:  $Gait Training: 8-22 mins                    G Codes:       Durenda Hurt. Renaldo Fiddler, [facility] Acute Rehab Services Pager [phone]    Vena Austria 09/24/2016, 4:01 PM

## 2016-09-25 ENCOUNTER — Encounter (HOSPITAL_COMMUNITY): Payer: Self-pay | Admitting: *Deleted

## 2016-09-25 ENCOUNTER — Inpatient Hospital Stay (HOSPITAL_COMMUNITY): Payer: Non-veteran care

## 2016-09-25 DIAGNOSIS — F419 Anxiety disorder, unspecified: Secondary | ICD-10-CM

## 2016-09-25 DIAGNOSIS — R918 Other nonspecific abnormal finding of lung field: Secondary | ICD-10-CM

## 2016-09-25 DIAGNOSIS — F324 Major depressive disorder, single episode, in partial remission: Secondary | ICD-10-CM

## 2016-09-25 LAB — CBC WITH DIFFERENTIAL/PLATELET
BASOS PCT: 0 %
Basophils Absolute: 0 10*3/uL (ref 0.0–0.1)
EOS PCT: 0 %
Eosinophils Absolute: 0 10*3/uL (ref 0.0–0.7)
HEMATOCRIT: 32.4 % — AB (ref 39.0–52.0)
Hemoglobin: 10 g/dL — ABNORMAL LOW (ref 13.0–17.0)
LYMPHS PCT: 22 %
Lymphs Abs: 2.5 10*3/uL (ref 0.7–4.0)
MCH: 23.8 pg — ABNORMAL LOW (ref 26.0–34.0)
MCHC: 30.9 g/dL (ref 30.0–36.0)
MCV: 77.1 fL — AB (ref 78.0–100.0)
MONO ABS: 0.6 10*3/uL (ref 0.1–1.0)
MONOS PCT: 5 %
NEUTROS ABS: 8.2 10*3/uL — AB (ref 1.7–7.7)
Neutrophils Relative %: 73 %
Platelets: 552 10*3/uL — ABNORMAL HIGH (ref 150–400)
RBC: 4.2 MIL/uL — ABNORMAL LOW (ref 4.22–5.81)
RDW: 16.5 % — AB (ref 11.5–15.5)
WBC: 11.3 10*3/uL — AB (ref 4.0–10.5)

## 2016-09-25 LAB — BASIC METABOLIC PANEL
ANION GAP: 8 (ref 5–15)
BUN: 17 mg/dL (ref 6–20)
CALCIUM: 8.5 mg/dL — AB (ref 8.9–10.3)
CO2: 23 mmol/L (ref 22–32)
Chloride: 108 mmol/L (ref 101–111)
Creatinine, Ser: 0.55 mg/dL — ABNORMAL LOW (ref 0.61–1.24)
GFR calc Af Amer: >60 mL/min (ref >60)
Glucose, Bld: 74 mg/dL (ref 65–99)
POTASSIUM: 3.2 mmol/L — AB (ref 3.5–5.1)
SODIUM: 139 mmol/L (ref 135–145)

## 2016-09-25 MED ORDER — POTASSIUM CHLORIDE CRYS ER 20 MEQ PO TBCR
40.0000 meq | EXTENDED_RELEASE_TABLET | Freq: Once | ORAL | Status: AC
  Administered 2016-09-25: 40 meq via ORAL
  Filled 2016-09-25: qty 2

## 2016-09-25 MED ORDER — LORAZEPAM 2 MG/ML IJ SOLN
2.0000 mg | Freq: Every day | INTRAMUSCULAR | Status: DC
  Administered 2016-09-25: 2 mg via INTRAVENOUS
  Filled 2016-09-25: qty 1

## 2016-09-25 MED ORDER — METHYLPREDNISOLONE SODIUM SUCC 125 MG IJ SOLR
60.0000 mg | Freq: Every day | INTRAMUSCULAR | Status: DC
  Administered 2016-09-26: 60 mg via INTRAVENOUS
  Filled 2016-09-25: qty 2

## 2016-09-25 MED ORDER — HEPARIN SODIUM (PORCINE) 5000 UNIT/ML IJ SOLN
5000.0000 [IU] | Freq: Three times a day (TID) | INTRAMUSCULAR | Status: DC
  Administered 2016-09-25 – 2016-09-26 (×2): 5000 [IU] via SUBCUTANEOUS
  Filled 2016-09-25 (×2): qty 1

## 2016-09-25 MED ORDER — DM-GUAIFENESIN ER 30-600 MG PO TB12
1.0000 | ORAL_TABLET | Freq: Two times a day (BID) | ORAL | Status: DC
  Administered 2016-09-25 – 2016-09-26 (×2): 1 via ORAL
  Filled 2016-09-25 (×2): qty 1

## 2016-09-25 MED ORDER — SODIUM CHLORIDE 3 % IN NEBU
4.0000 mL | INHALATION_SOLUTION | Freq: Two times a day (BID) | RESPIRATORY_TRACT | Status: DC
  Administered 2016-09-26: 4 mL via RESPIRATORY_TRACT
  Filled 2016-09-25 (×2): qty 4

## 2016-09-25 MED ORDER — POTASSIUM CHLORIDE CRYS ER 20 MEQ PO TBCR
20.0000 meq | EXTENDED_RELEASE_TABLET | Freq: Once | ORAL | Status: AC
  Administered 2016-09-25: 20 meq via ORAL
  Filled 2016-09-25: qty 1

## 2016-09-25 MED ORDER — IOPAMIDOL (ISOVUE-300) INJECTION 61%
INTRAVENOUS | Status: AC
  Administered 2016-09-25: 100 mL
  Filled 2016-09-25: qty 100

## 2016-09-25 NOTE — Progress Notes (Signed)
PROGRESS NOTE    William Kirk  RUE:[id number] DOB: 1940-10-13 DOA: 09/21/2016 PCP: [facility]     Brief Narrative:  76 y.o. WM PMHx former smoker, COPD, not on oxygen at home at baseline, Depression, PVD, arthritis,   Who presents with productive cough, shortness breath and weight loss.  Patient states that he had pneumonia in May, which was treated with antibiotics, he has been doing oKay until 2 days ago when he stared SOB and cough again. Patient coughs up greenish colored sputum. He also had intermittent fever with temperature 101 at one time at home. Patient denies chest pain, runny nose or sore throat. Patient states that he has difficulty swallowing sometimes. His pulmonologist, Dr. Alvan Dame in [facility] scheduled him for "endoscopy" on 10/03/16 due to posssible aspiration. Patient states that he had approximately 30 pounds of weight loss since May. Patient denies nausea, vomiting, diarrhea, abdominal pain, symptoms of UTI or unilateral weakness. He has generalized weakness. Patient was found to have oxygen 80% on 6 L nasal cannula oxygen, which improved to 94% on NRB. Of note, pt is on Tobramycin nebs.    Subjective: 8/19 A/O 4, negative CP, positive SOB, positive WOB, negative abdominal pain, negative N/V.    Assessment & Plan:   Principal Problem:   Acute respiratory failure with hypoxia (HCC) Active Problems:   Multifocal pneumonia   COPD with acute exacerbation (HCC)   Hypokalemia   Protein-calorie malnutrition, severe (HCC)   HCAP (healthcare-associated pneumonia)   Depression   Aspiration pneumonia (HCC)   HTN (hypertension)   Respiratory failure with Hypoxia  - Multifactorial multilobar pneumonia, primary lung neoplasm?, CHF? Dysphasia (aspiration pneumonia) -Titrate O2 to maintain SPO2> 92% -Flutter valve -DuoNeb QID -Physiotherapy vest TID -Mucinex DM BID -Solu-Medrol 60 mg daily -Aspiration precautions  Multilobar Aspiration  pneumonia RLL - Dysphagia 3 diet, nectar thick liquids -Complete seven-day course antibiotics -When comparing 06/2016 chest CT to 09/2016 CT RLL opacification has not resolved cannot R/O mass/nodule superimposed. -Hypertonic nebulizer: Start 8/20 -See pulmonary nodule  Multiple pulmonary nodules in a smoker/Primary Lung neoplasm? -Patient most likely has primary lung neoplasm complete cancer workup prior to discharge -Echo echocardiogram pending -CT head, Abdomen and Pelvis pending (cancer workup) -8/20 consult PCCM: Intubate patient and perform bronchoscopy with BAL and FNA   COPD Exacerbation.  -See respiratory failure  CHF? -Echocardiogram pending as part of cancer workup  Essential HTN -Stable   Hypokalemia.  -K-Dur 60 mEq    Severe calorie protein malnutrition.  -Follow dietary recommendations,     Depression/Anxiety.  -Continue trazodone and sertraline, per home regimen.  -Ativan 2 mg QHS      DVT prophylaxis: Heparin subcutaneous Code Status: Full Family Communication: Spoke with wife at length concerning possible lung neoplasm. She is on board with patient staying in hospital until workup complete Disposition Plan: Completion cancer workup   Consultants:  None    Procedures/Significant Events:  8/15 CT Angiogram PE protocol-negative for PE -consolidation with significant collapse RLL - Multiple foci of airspace opacity bilaterally. Multiple nodular opacification bilateral, increased from previous study 3 months ago. - Multiple foci of adenopathy persist.  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  -Multifocal pneumonia 8/19 CT head W/WO contrast:-Negative for metastatic disease 8/19 CT Abdomen Pelvis W contrast:-Negative evidence malignancy abdomen/pelvis      I have personally reviewed and interpreted all radiology studies and my findings are as above.  VENTILATOR SETTINGS: None   Cultures 8/15 blood  NGTD 8/15  respiratory virus panel negative 8/15 strep pneumo/Legionella urine antigen negative    Antimicrobials: Anti-infectives    Start     Stop   09/22/16 0000  vancomycin (VANCOCIN) IVPB 750 mg/150 ml premix  Status:  Discontinued     09/22/16 1213   09/21/16 2230  doxycycline (VIBRAMYCIN) 100 mg in dextrose 5 % 250 mL IVPB  Status:  Discontinued     09/22/16 1213   09/21/16 2200  piperacillin-tazobactam (ZOSYN) IVPB 3.375 g         09/21/16 2100  azithromycin (ZITHROMAX) 500 mg in dextrose 5 % 250 mL IVPB  Status:  Discontinued     09/21/16 2138   09/21/16 1300  vancomycin (VANCOCIN) IVPB 1000 mg/200 mL premix     09/21/16 1425   09/21/16 1215  vancomycin (VANCOCIN) 1,250 mg in sodium chloride 0.9 % 250 mL IVPB  Status:  Discontinued     09/21/16 1248   09/21/16 1215  piperacillin-tazobactam (ZOSYN) IVPB 3.375 g     09/21/16 1308       Devices    LINES / TUBES:      Continuous Infusions: . piperacillin-tazobactam (ZOSYN)  IV 3.375 g (09/25/16 0828)     Objective: Vitals:   09/24/16 2000 09/24/16 2038 09/25/16 0558 09/25/16 0755  BP: 140/69  (!) 138/59   Pulse: 99  91   Resp:      Temp: 98.3 F (36.8 C)  98.2 F (36.8 C)   TempSrc: Oral  Oral   SpO2:  96% 91% 94%  Weight:      Height:        Intake/Output Summary (Last 24 hours) at 09/25/16 0916 Last data filed at 09/25/16 0230  Gross per 24 hour  Intake              560 ml  Output                1 ml  Net              559 ml   Filed Weights   09/21/16 1051 09/22/16 0418  Weight: 140 lb (63.5 kg) 152 lb 3.2 oz (69 kg)    Examination:  General: A/O 4, positive Acute respiratory distress, cachectic Eyes: negative scleral hemorrhage, negative anisocoria, negative icterus Lungs: tachypnea diffuse poor air movement, RLL little to no air movement, positive crackles, positive diffuse expiratory wheeze,  Cardiovascular: Tachycardic, Regular rhythm without murmur gallop or rub normal S1 and  S2 Abdomen: negative abdominal pain, nondistended, positive soft, bowel sounds, no rebound, no ascites, no appreciable mass Extremities: No significant cyanosis, clubbing, or edema bilateral lower extremities Skin: Negative rashes, lesions, ulcers Psychiatric:  Negative depression, positive anxiety, negative fatigue, negative mania  Central nervous system:  Cranial nerves II through XII intact, tongue/uvula midline, all extremities muscle strength 5/5, sensation intact throughout, negative dysarthria, negative expressive aphasia, negative receptive aphasia.  .     Data Reviewed: Care during the described time interval was provided by me .  I have reviewed this patient's available data, including medical history, events of note, physical examination, and all test results as part of my evaluation.  CBC:  Recent Labs Lab 09/21/16 1055 09/24/16 0405 09/24/16 1220 09/25/16 0249  WBC 19.3* 13.6* 13.7* 11.3*  NEUTROABS 16.7* 10.3* 12.7* 8.2*  HGB 10.5* 9.8* 10.7* 10.0*  HCT 33.5* 31.7* 33.6* 32.4*  MCV 76.5* 76.2* 76.4* 77.1*  PLT 551* 565* 587* 552*   Basic Metabolic Panel:  Recent Labs Lab 09/21/16  1055 09/22/16 0406 09/22/16 1850 09/23/16 0310 09/24/16 0405 09/25/16 0249  NA 136  --  136 138 141 139  K 2.7*  --  2.8* 2.8* 3.1* 3.2*  CL 95*  --  102 104 108 108  CO2 30  --  25 25 24 23   GLUCOSE 135*  --  159* 96 82 74  BUN 26*  --  20 21* 18 17  CREATININE 0.92  --  0.76 0.59* 0.60* 0.55*  CALCIUM 8.6*  --  8.4* 8.4* 8.6* 8.5*  MG  --  2.0  --   --   --   --    GFR: Estimated Creatinine Clearance: 79.1 mL/min (A) (by C-G formula based on SCr of 0.55 mg/dL (L)). Liver Function Tests:  Recent Labs Lab 09/21/16 1055  AST 73*  ALT 112*  ALKPHOS 133*  BILITOT 0.5  PROT 7.7  ALBUMIN 2.1*   No results for input(s): LIPASE, AMYLASE in the last 168 hours. No results for input(s): AMMONIA in the last 168 hours. Coagulation Profile:  Recent Labs Lab 09/21/16 2009   INR 1.30   Cardiac Enzymes: No results for input(s): CKTOTAL, CKMB, CKMBINDEX, TROPONINI in the last 168 hours. BNP (last 3 results) No results for input(s): PROBNP in the last 8760 hours. HbA1C: No results for input(s): HGBA1C in the last 72 hours. CBG: No results for input(s): GLUCAP in the last 168 hours. Lipid Profile: No results for input(s): CHOL, HDL, LDLCALC, TRIG, CHOLHDL, LDLDIRECT in the last 72 hours. Thyroid Function Tests: No results for input(s): TSH, T4TOTAL, FREET4, T3FREE, THYROIDAB in the last 72 hours. Anemia Panel: No results for input(s): VITAMINB12, FOLATE, FERRITIN, TIBC, IRON, RETICCTPCT in the last 72 hours. Sepsis Labs:  Recent Labs Lab 09/21/16 1111 09/21/16 2009  PROCALCITON  --  0.36  LATICACIDVEN 1.63 0.8    Recent Results (from the past 240 hour(s))  Blood culture (routine x 2)     Status: None (Preliminary result)   Collection Time: 09/21/16 10:55 AM  Result Value Ref Range Status   Specimen Description BLOOD BLOOD LEFT ARM  Final   Special Requests   Final    BOTTLES DRAWN AEROBIC AND ANAEROBIC Blood Culture adequate volume   Culture   Final    NO GROWTH 3 DAYS Performed at [facility], [address] [place], [place] [address]    Report Status PENDING  Incomplete  Blood culture (routine x 2)     Status: None (Preliminary result)   Collection Time: 09/21/16 11:10 AM  Result Value Ref Range Status   Specimen Description BLOOD BLOOD RIGHT ARM  Final   Special Requests   Final    BOTTLES DRAWN AEROBIC AND ANAEROBIC Blood Culture adequate volume   Culture   Final    NO GROWTH 3 DAYS Performed at [facility], [address] [place], [place] [address]    Report Status PENDING  Incomplete  Respiratory Panel by PCR     Status: None   Collection Time: 09/21/16  7:49 PM  Result Value Ref Range Status   Adenovirus NOT DETECTED NOT DETECTED Final   Coronavirus 229E NOT DETECTED NOT DETECTED Final   Coronavirus HKU1 NOT  DETECTED NOT DETECTED Final   Coronavirus NL63 NOT DETECTED NOT DETECTED Final   Coronavirus OC43 NOT DETECTED NOT DETECTED Final   Metapneumovirus NOT DETECTED NOT DETECTED Final   Rhinovirus / Enterovirus NOT DETECTED NOT DETECTED Final   Influenza A NOT DETECTED NOT DETECTED  Final   Influenza A H1 NOT DETECTED NOT DETECTED Final   Influenza A H1 2009 NOT DETECTED NOT DETECTED Final   Influenza A H3 NOT DETECTED NOT DETECTED Final   Influenza B NOT DETECTED NOT DETECTED Final   Parainfluenza Virus 1 NOT DETECTED NOT DETECTED Final   Parainfluenza Virus 2 NOT DETECTED NOT DETECTED Final   Parainfluenza Virus 3 NOT DETECTED NOT DETECTED Final   Parainfluenza Virus 4 NOT DETECTED NOT DETECTED Final   Respiratory Syncytial Virus NOT DETECTED NOT DETECTED Final   Bordetella pertussis NOT DETECTED NOT DETECTED Final   Chlamydophila pneumoniae NOT DETECTED NOT DETECTED Final   Mycoplasma pneumoniae NOT DETECTED NOT DETECTED Final         Radiology Studies: Dg Chest 2 View  Result Date: 09/23/2016 CLINICAL DATA:  Hypoxia, follow-up pneumonia EXAM: CHEST  2 VIEW COMPARISON:  09/21/2016 FINDINGS: Cardiac shadow is within normal limits. The lungs are well aerated bilaterally. Patchy infiltrates are seen bilaterally but mildly improved when compared with the prior exam. Cardiac shadow is stable. Aortic calcifications are again noted. No sizable pneumothorax is noticed. No acute bony abnormality is noted. IMPRESSION: Patchy infiltrative changes are again identified bilaterally but mildly improved when compared with the prior exam. Electronically Signed   By: Alcide Clever M.D.   On: 09/23/2016 10:33        Scheduled Meds: . aspirin EC  81 mg Oral Daily  . cilostazol  100 mg Oral BID  . gabapentin  300 mg Oral QHS  . ipratropium-albuterol  3 mL Nebulization BID  . lactose free nutrition  237 mL Oral TID BM  . mouth rinse  15 mL Mouth Rinse BID  . methylPREDNISolone (SOLU-MEDROL) injection   40 mg Intravenous Daily  . sertraline  200 mg Oral Daily  . traZODone  50 mg Oral QHS  . vitamin B-12  1,000 mcg Oral Daily   Continuous Infusions: . piperacillin-tazobactam (ZOSYN)  IV 3.375 g (09/25/16 0828)     LOS: 4 days    Time spent:40 min    Metztli Sachdev, Roselind Messier, MD [facility] Pager [phone]  If 7PM-7AM, please contact night-coverage [email or web address] Password [email or web address] 09/25/2016, 9:16 AM

## 2016-09-25 NOTE — Progress Notes (Signed)
15 mL [place] Isovue 300 administered at 10:28 am. Ordered for CT. Order currently not showing up in [facility].  Grant Fontana BSN, RN

## 2016-09-25 NOTE — Progress Notes (Addendum)
Physical Therapy Treatment Patient Details Name: William Kirk MRN: [id number] DOB: November 06, 1940 Today's Date: 09/25/2016    History of Present Illness This 76 y.o. male admitted with SOB and weight loss.  CT angio negative for PE, but showed multifocal PNA and multiple nodular opacities raising concern for potential underlying neoplasm.   PMH includes:  COPS, h/o PNA, PVD; s/p Rt THA    PT Comments    Patient continues to do well with gait with RW.  Able to negotiate stairs with 1 rail and supervision. Achieved goals and ready for d/c from PT perspective. Will have f/u HHPT for continued therapy.  Follow Up Recommendations  Home health PT;Supervision/Assistance - 24 hour     Equipment Recommendations  None recommended by PT    Recommendations for Other Services       Precautions / Restrictions Precautions Precautions: Fall Precaution Comments: Watch SaO2 during mobility Restrictions Weight Bearing Restrictions: No    Mobility  Bed Mobility Overal bed mobility: Independent                Transfers Overall transfer level: Modified independent Equipment used: Rolling walker (2 wheeled) Transfers: Sit to/from Stand Sit to Stand: Modified independent (Device/Increase time)         General transfer comment: No physical assist or assist for safety needed.  Ambulation/Gait Ambulation/Gait assistance: Supervision Ambulation Distance (Feet): 200 Feet Assistive device: Rolling walker (2 wheeled) Gait Pattern/deviations: Step-through pattern;Decreased stride length;Trunk flexed Gait velocity: decreased Gait velocity interpretation: Below normal speed for age/gender General Gait Details: Patient with slow but steady gait with RW.  No physical assist needed.  Patient's SaO2 remained from 90-95% during gait on 3L of O2.   Stairs Stairs: Yes   Stair Management: One rail Right;Alternating pattern;Step to pattern;Forwards Number of Stairs: 4 General stair comments:  Verbal cues on negotiating stairs safely using step-to pattern.  Patient moving quickly, using step-to and alternating steps.  Cues to move more slowly/safely.  Wheelchair Mobility    Modified Rankin (Stroke Patients Only)       Balance     Sitting balance-Leahy Scale: Good       Standing balance-Leahy Scale: Fair                              Cognition Arousal/Alertness: Awake/alert Behavior During Therapy: WFL for tasks assessed/performed Overall Cognitive Status: Within Functional Limits for tasks assessed                                        Exercises      General Comments        Pertinent Vitals/Pain Pain Assessment: No/denies pain    Home Living                      Prior Function            PT Goals (current goals can now be found in the care plan section) Acute Rehab PT Goals Patient Stated Goal: To get back to normal  Progress towards PT goals: Goals met/education completed, patient discharged from PT    Frequency    Min 3X/week      PT Plan Current plan remains appropriate    Co-evaluation              AM-PAC PT "6 Clicks" Daily Activity  Outcome Measure  Difficulty turning over in bed (including adjusting bedclothes, sheets and blankets)?: None Difficulty moving from lying on back to sitting on the side of the bed? : None Difficulty sitting down on and standing up from a chair with arms (e.g., wheelchair, bedside commode, etc,.)?: None Help needed moving to and from a bed to chair (including a wheelchair)?: None Help needed walking in hospital room?: A Little Help needed climbing 3-5 steps with a railing? : A Little 6 Click Score: 22    End of Session Equipment Utilized During Treatment: Gait belt;Oxygen Activity Tolerance: Patient tolerated treatment well Patient left: in bed;with call bell/phone within reach;with nursing/sitter in room Nurse Communication: Mobility status (Ready to d/c  from PT perspective) PT Visit Diagnosis: Muscle weakness (generalized) (M62.81);Unsteadiness on feet (R26.81)     Time: 1308-6578 PT Time Calculation (min) (ACUTE ONLY): 15 min  Charges:  $Gait Training: 8-22 mins                    G Codes:       Durenda Hurt. Renaldo Fiddler, [facility] Acute [facility]hab Services Pager [phone]    Vena Austria 09/25/2016, 2:43 PM

## 2016-09-25 NOTE — Progress Notes (Signed)
Potassium level 3.2, hospitalist team paged.

## 2016-09-25 NOTE — Progress Notes (Signed)
Pt wanting to leave AMA. Hospitalist spoke with pt's wife extensively. MD planning to have conference with pt's PCCM about pt having bronchoscopy done. Pt's wife to discuss with pt as well. Will continue current plan of care.   Grant Fontana BSN, RN

## 2016-09-25 NOTE — Plan of Care (Signed)
Problem: Mobility Goal: Ambulates independently Outcome: Progressing Pt verbalized understanding

## 2016-09-26 ENCOUNTER — Inpatient Hospital Stay (HOSPITAL_COMMUNITY): Payer: Non-veteran care

## 2016-09-26 DIAGNOSIS — I340 Nonrheumatic mitral (valve) insufficiency: Secondary | ICD-10-CM

## 2016-09-26 DIAGNOSIS — R1310 Dysphagia, unspecified: Secondary | ICD-10-CM

## 2016-09-26 LAB — CBC WITH DIFFERENTIAL/PLATELET
BASOS ABS: 0 10*3/uL (ref 0.0–0.1)
Basophils Relative: 0 %
EOS PCT: 1 %
Eosinophils Absolute: 0.1 10*3/uL (ref 0.0–0.7)
HEMATOCRIT: 33.8 % — AB (ref 39.0–52.0)
Hemoglobin: 10.5 g/dL — ABNORMAL LOW (ref 13.0–17.0)
LYMPHS ABS: 2.6 10*3/uL (ref 0.7–4.0)
LYMPHS PCT: 21 %
MCH: 23.6 pg — AB (ref 26.0–34.0)
MCHC: 31.1 g/dL (ref 30.0–36.0)
MCV: 76.1 fL — AB (ref 78.0–100.0)
MONO ABS: 0.7 10*3/uL (ref 0.1–1.0)
Monocytes Relative: 6 %
NEUTROS ABS: 8.9 10*3/uL — AB (ref 1.7–7.7)
Neutrophils Relative %: 72 %
Platelets: 514 10*3/uL — ABNORMAL HIGH (ref 150–400)
RBC: 4.44 MIL/uL (ref 4.22–5.81)
RDW: 16.2 % — AB (ref 11.5–15.5)
WBC: 12.3 10*3/uL — ABNORMAL HIGH (ref 4.0–10.5)

## 2016-09-26 LAB — BASIC METABOLIC PANEL
Anion gap: 7 (ref 5–15)
BUN: 14 mg/dL (ref 6–20)
CHLORIDE: 105 mmol/L (ref 101–111)
CO2: 25 mmol/L (ref 22–32)
CREATININE: 0.67 mg/dL (ref 0.61–1.24)
Calcium: 8.4 mg/dL — ABNORMAL LOW (ref 8.9–10.3)
GFR calc Af Amer: >60 mL/min (ref >60)
GFR calc non Af Amer: >60 mL/min (ref >60)
GLUCOSE: 76 mg/dL (ref 65–99)
POTASSIUM: 3.6 mmol/L (ref 3.5–5.1)
Sodium: 137 mmol/L (ref 135–145)

## 2016-09-26 LAB — ECHOCARDIOGRAM COMPLETE
HEIGHTINCHES: 70 in
Weight: 2355.2 oz

## 2016-09-26 LAB — CULTURE, BLOOD (ROUTINE X 2)
Culture: NO GROWTH
Culture: NO GROWTH
SPECIAL REQUESTS: ADEQUATE
Special Requests: ADEQUATE

## 2016-09-26 LAB — MAGNESIUM: MAGNESIUM: 1.9 mg/dL (ref 1.7–2.4)

## 2016-09-26 MED ORDER — LEVOFLOXACIN 750 MG PO TABS
750.0000 mg | ORAL_TABLET | Freq: Every day | ORAL | Status: DC
  Administered 2016-09-26: 750 mg via ORAL
  Filled 2016-09-26: qty 1

## 2016-09-26 MED ORDER — LEVOFLOXACIN 750 MG PO TABS: 750.0000 mg | ORAL_TABLET | Freq: Every day | ORAL | 0 refills | Status: AC

## 2016-09-26 MED ORDER — RESOURCE THICKENUP CLEAR PO POWD: 1.0000 | ORAL | 0 refills | Status: AC | PRN

## 2016-09-26 MED ORDER — ALBUTEROL SULFATE (2.5 MG/3ML) 0.083% IN NEBU: 2.5000 mg | INHALATION_SOLUTION | Freq: Four times a day (QID) | RESPIRATORY_TRACT | 0 refills | Status: DC | PRN

## 2016-09-26 MED ORDER — LEVOFLOXACIN 500 MG PO TABS
500.0000 mg | ORAL_TABLET | Freq: Every day | ORAL | Status: DC
  Filled 2016-09-26: qty 1

## 2016-09-26 NOTE — Discharge Summary (Signed)
Physician Discharge Summary  William Kirk IBB:[id number] DOB: 1940-05-12 DOA: 09/21/2016  PCP: [facility]  Admit date: 09/21/2016 Discharge date: 09/26/2016  Admitted From: Home  Disposition: Home   Recommendations for Outpatient Follow-up:  1. Follow up with PCP in 1- week 2. Patient will follow up with Dr. Melvyn Novas at the pulmonary clinic on October 1st, he will need a non contrast chest CT, to evaluate resolution of the pulmonary infiltrates, and to evaluate the need of diagnostic bronchoscopy. 3. Patient placed on levofloxacin for the next 5 days 4. Home 02 screen ordered 5. Continue to use incentive spirometer and flutter valve  Home Health: Yes  Equipment/Devices: NA  Discharge Condition: Stable CODE STATUS: full  Diet recommendation:  Diet recommendations: Dysphagia 3 (mechanical soft);Nectar-thick liquid Liquids provided via: Cup;No straw Medication Administration: Whole meds with puree Supervision: Patient able to self feed;Full supervision/cueing for compensatory strategies Compensations: Slow rate;Small sips/bites;Chin tuck Postural Changes and/or Swallow Maneuvers: Seated upright 90 degrees  Brief/Interim Summary: 76 year old male presented with productive cough, dyspnea and weight loss. Patient is known to have COPD, tobacco abuse, arthritis, and depression. He developed dyspnea for last 2 days prior to hospitalization, associated with greenish productive sputum, intermittent fevers and generalized weakness. He has been worked up for swallowing dysfunction as an outpatient. On initial physical examination his oxygen saturation was found to be 80% on 6 L per nasal cannula, blood pressure 114/53, heart rate 85-93, respiratory rate 21-30. Moist mucous membranes, heart S1-S2 present rhythmic, no gallops, rubs murmurs, lungs with scattered rails, wheezing and rhonchi bilaterally, abdomen soft nontender, lower extremities no edema. Sodium 136, potassium 2.7, chloride  95, bicarbonate 30, glucose 135, BUN 26, creatinine 0.92, white cell count 9.3, hemoglobin 10.5, hematocrit 33.5, platelets 551. EKG normal sinus rhythm, rate 82 bpm, normal axis, normal intervals. His x-ray with new left upper lobe infiltrate, alveolar, loss lung volume right lower lobe. CT chest with no pulmonary embolism, collapse of the right lower lobe, with eft upper and lower lobe infiltrates.   Patient was admitted to the hospital working diagnosis of acute hypoxic failure due to multifocal pneumonia  1. Multilobar aspiration pneumonia, predominantly left lower lobe, present on admission, complicated by acute hypoxic respiratory failure. Patient was admitted to the stepdown unit, placed on supplemental oxygen per high flow nasal cannula, antibiotic therapy with vancomycin and Zosyn intravenously. He was seen by speech therapy, findings positive for swallow dysfunction, diet  was changed to dysphagia 3 with good toleration. Patient improved to the medical therapy, the antibiotic therapy was descalated to Zosyn, and he will be discharged on levofloxacin for 5 more days. He had aggressive pulmonary toileting with chest PT, flutter valve and incentive spirometry. He will have a home oxygen screen before discharge. He was instructed about aspiration precautions.  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 Need to rule out underlying malignancy or mycobacterial infection. Case was discussed with Dr. Ashok Cordia, with recommendations to follow-up as an outpatient in 4-6 weeks, then he will need a repeat chest CT, noncontrast, and evaluate for the need of diagnostic bronchoscopy. Patient does have a pulmonary specialist at the [place] but at this point is willing to follow-up with [facility], for post hospitalization follow up, appointment made for 11/07/2016.   2. COPD exacerbation. Patient was  placed on bronchodilator therapy, oximetry monitoring and supplemental oxygen per nasal cannula. He received a short course of IV  Physician Discharge Summary  William Kirk IBB:[id number] DOB: 1940-05-12 DOA: 09/21/2016  PCP: [facility]  Admit date: 09/21/2016 Discharge date: 09/26/2016  Admitted From: Home  Disposition: Home   Recommendations for Outpatient Follow-up:  1. Follow up with PCP in 1- week 2. Patient will follow up with Dr. Melvyn Novas at the pulmonary clinic on October 1st, he will need a non contrast chest CT, to evaluate resolution of the pulmonary infiltrates, and to evaluate the need of diagnostic bronchoscopy. 3. Patient placed on levofloxacin for the next 5 days 4. Home 02 screen ordered 5. Continue to use incentive spirometer and flutter valve  Home Health: Yes  Equipment/Devices: NA  Discharge Condition: Stable CODE STATUS: full  Diet recommendation:  Diet recommendations: Dysphagia 3 (mechanical soft);Nectar-thick liquid Liquids provided via: Cup;No straw Medication Administration: Whole meds with puree Supervision: Patient able to self feed;Full supervision/cueing for compensatory strategies Compensations: Slow rate;Small sips/bites;Chin tuck Postural Changes and/or Swallow Maneuvers: Seated upright 90 degrees  Brief/Interim Summary: 76 year old male presented with productive cough, dyspnea and weight loss. Patient is known to have COPD, tobacco abuse, arthritis, and depression. He developed dyspnea for last 2 days prior to hospitalization, associated with greenish productive sputum, intermittent fevers and generalized weakness. He has been worked up for swallowing dysfunction as an outpatient. On initial physical examination his oxygen saturation was found to be 80% on 6 L per nasal cannula, blood pressure 114/53, heart rate 85-93, respiratory rate 21-30. Moist mucous membranes, heart S1-S2 present rhythmic, no gallops, rubs murmurs, lungs with scattered rails, wheezing and rhonchi bilaterally, abdomen soft nontender, lower extremities no edema. Sodium 136, potassium 2.7, chloride  95, bicarbonate 30, glucose 135, BUN 26, creatinine 0.92, white cell count 9.3, hemoglobin 10.5, hematocrit 33.5, platelets 551. EKG normal sinus rhythm, rate 82 bpm, normal axis, normal intervals. His x-ray with new left upper lobe infiltrate, alveolar, loss lung volume right lower lobe. CT chest with no pulmonary embolism, collapse of the right lower lobe, with eft upper and lower lobe infiltrates.   Patient was admitted to the hospital working diagnosis of acute hypoxic failure due to multifocal pneumonia  1. Multilobar aspiration pneumonia, predominantly left lower lobe, present on admission, complicated by acute hypoxic respiratory failure. Patient was admitted to the stepdown unit, placed on supplemental oxygen per high flow nasal cannula, antibiotic therapy with vancomycin and Zosyn intravenously. He was seen by speech therapy, findings positive for swallow dysfunction, diet  was changed to dysphagia 3 with good toleration. Patient improved to the medical therapy, the antibiotic therapy was descalated to Zosyn, and he will be discharged on levofloxacin for 5 more days. He had aggressive pulmonary toileting with chest PT, flutter valve and incentive spirometry. He will have a home oxygen screen before discharge. He was instructed about aspiration precautions.  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 Need to rule out underlying malignancy or mycobacterial infection. Case was discussed with Dr. Ashok Cordia, with recommendations to follow-up as an outpatient in 4-6 weeks, then he will need a repeat chest CT, noncontrast, and evaluate for the need of diagnostic bronchoscopy. Patient does have a pulmonary specialist at the [place] but at this point is willing to follow-up with [facility], for post hospitalization follow up, appointment made for 11/07/2016.   2. COPD exacerbation. Patient was  placed on bronchodilator therapy, oximetry monitoring and supplemental oxygen per nasal cannula. He received a short course of IV  Physician Discharge Summary  William Kirk IBB:[id number] DOB: 1940-05-12 DOA: 09/21/2016  PCP: [facility]  Admit date: 09/21/2016 Discharge date: 09/26/2016  Admitted From: Home  Disposition: Home   Recommendations for Outpatient Follow-up:  1. Follow up with PCP in 1- week 2. Patient will follow up with Dr. Melvyn Novas at the pulmonary clinic on October 1st, he will need a non contrast chest CT, to evaluate resolution of the pulmonary infiltrates, and to evaluate the need of diagnostic bronchoscopy. 3. Patient placed on levofloxacin for the next 5 days 4. Home 02 screen ordered 5. Continue to use incentive spirometer and flutter valve  Home Health: Yes  Equipment/Devices: NA  Discharge Condition: Stable CODE STATUS: full  Diet recommendation:  Diet recommendations: Dysphagia 3 (mechanical soft);Nectar-thick liquid Liquids provided via: Cup;No straw Medication Administration: Whole meds with puree Supervision: Patient able to self feed;Full supervision/cueing for compensatory strategies Compensations: Slow rate;Small sips/bites;Chin tuck Postural Changes and/or Swallow Maneuvers: Seated upright 90 degrees  Brief/Interim Summary: 76 year old male presented with productive cough, dyspnea and weight loss. Patient is known to have COPD, tobacco abuse, arthritis, and depression. He developed dyspnea for last 2 days prior to hospitalization, associated with greenish productive sputum, intermittent fevers and generalized weakness. He has been worked up for swallowing dysfunction as an outpatient. On initial physical examination his oxygen saturation was found to be 80% on 6 L per nasal cannula, blood pressure 114/53, heart rate 85-93, respiratory rate 21-30. Moist mucous membranes, heart S1-S2 present rhythmic, no gallops, rubs murmurs, lungs with scattered rails, wheezing and rhonchi bilaterally, abdomen soft nontender, lower extremities no edema. Sodium 136, potassium 2.7, chloride  95, bicarbonate 30, glucose 135, BUN 26, creatinine 0.92, white cell count 9.3, hemoglobin 10.5, hematocrit 33.5, platelets 551. EKG normal sinus rhythm, rate 82 bpm, normal axis, normal intervals. His x-ray with new left upper lobe infiltrate, alveolar, loss lung volume right lower lobe. CT chest with no pulmonary embolism, collapse of the right lower lobe, with eft upper and lower lobe infiltrates.   Patient was admitted to the hospital working diagnosis of acute hypoxic failure due to multifocal pneumonia  1. Multilobar aspiration pneumonia, predominantly left lower lobe, present on admission, complicated by acute hypoxic respiratory failure. Patient was admitted to the stepdown unit, placed on supplemental oxygen per high flow nasal cannula, antibiotic therapy with vancomycin and Zosyn intravenously. He was seen by speech therapy, findings positive for swallow dysfunction, diet  was changed to dysphagia 3 with good toleration. Patient improved to the medical therapy, the antibiotic therapy was descalated to Zosyn, and he will be discharged on levofloxacin for 5 more days. He had aggressive pulmonary toileting with chest PT, flutter valve and incentive spirometry. He will have a home oxygen screen before discharge. He was instructed about aspiration precautions.  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 Need to rule out underlying malignancy or mycobacterial infection. Case was discussed with Dr. Ashok Cordia, with recommendations to follow-up as an outpatient in 4-6 weeks, then he will need a repeat chest CT, noncontrast, and evaluate for the need of diagnostic bronchoscopy. Patient does have a pulmonary specialist at the [place] but at this point is willing to follow-up with [facility], for post hospitalization follow up, appointment made for 11/07/2016.   2. COPD exacerbation. Patient was  placed on bronchodilator therapy, oximetry monitoring and supplemental oxygen per nasal cannula. He received a short course of IV  Physician Discharge Summary  William Kirk IBB:[id number] DOB: 1940-05-12 DOA: 09/21/2016  PCP: [facility]  Admit date: 09/21/2016 Discharge date: 09/26/2016  Admitted From: Home  Disposition: Home   Recommendations for Outpatient Follow-up:  1. Follow up with PCP in 1- week 2. Patient will follow up with Dr. Melvyn Novas at the pulmonary clinic on October 1st, he will need a non contrast chest CT, to evaluate resolution of the pulmonary infiltrates, and to evaluate the need of diagnostic bronchoscopy. 3. Patient placed on levofloxacin for the next 5 days 4. Home 02 screen ordered 5. Continue to use incentive spirometer and flutter valve  Home Health: Yes  Equipment/Devices: NA  Discharge Condition: Stable CODE STATUS: full  Diet recommendation:  Diet recommendations: Dysphagia 3 (mechanical soft);Nectar-thick liquid Liquids provided via: Cup;No straw Medication Administration: Whole meds with puree Supervision: Patient able to self feed;Full supervision/cueing for compensatory strategies Compensations: Slow rate;Small sips/bites;Chin tuck Postural Changes and/or Swallow Maneuvers: Seated upright 90 degrees  Brief/Interim Summary: 76 year old male presented with productive cough, dyspnea and weight loss. Patient is known to have COPD, tobacco abuse, arthritis, and depression. He developed dyspnea for last 2 days prior to hospitalization, associated with greenish productive sputum, intermittent fevers and generalized weakness. He has been worked up for swallowing dysfunction as an outpatient. On initial physical examination his oxygen saturation was found to be 80% on 6 L per nasal cannula, blood pressure 114/53, heart rate 85-93, respiratory rate 21-30. Moist mucous membranes, heart S1-S2 present rhythmic, no gallops, rubs murmurs, lungs with scattered rails, wheezing and rhonchi bilaterally, abdomen soft nontender, lower extremities no edema. Sodium 136, potassium 2.7, chloride  95, bicarbonate 30, glucose 135, BUN 26, creatinine 0.92, white cell count 9.3, hemoglobin 10.5, hematocrit 33.5, platelets 551. EKG normal sinus rhythm, rate 82 bpm, normal axis, normal intervals. His x-ray with new left upper lobe infiltrate, alveolar, loss lung volume right lower lobe. CT chest with no pulmonary embolism, collapse of the right lower lobe, with eft upper and lower lobe infiltrates.   Patient was admitted to the hospital working diagnosis of acute hypoxic failure due to multifocal pneumonia  1. Multilobar aspiration pneumonia, predominantly left lower lobe, present on admission, complicated by acute hypoxic respiratory failure. Patient was admitted to the stepdown unit, placed on supplemental oxygen per high flow nasal cannula, antibiotic therapy with vancomycin and Zosyn intravenously. He was seen by speech therapy, findings positive for swallow dysfunction, diet  was changed to dysphagia 3 with good toleration. Patient improved to the medical therapy, the antibiotic therapy was descalated to Zosyn, and he will be discharged on levofloxacin for 5 more days. He had aggressive pulmonary toileting with chest PT, flutter valve and incentive spirometry. He will have a home oxygen screen before discharge. He was instructed about aspiration precautions.  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 Need to rule out underlying malignancy or mycobacterial infection. Case was discussed with Dr. Ashok Cordia, with recommendations to follow-up as an outpatient in 4-6 weeks, then he will need a repeat chest CT, noncontrast, and evaluate for the need of diagnostic bronchoscopy. Patient does have a pulmonary specialist at the [place] but at this point is willing to follow-up with [facility], for post hospitalization follow up, appointment made for 11/07/2016.   2. COPD exacerbation. Patient was  placed on bronchodilator therapy, oximetry monitoring and supplemental oxygen per nasal cannula. He received a short course of IV  chest Jun 21, 2016 and August 2015. FINDINGS: Lower chest: Dense consolidation in the right lower lobe is identified. Multiple irregular nodules are seen in the lung bases with involvement also seen in the lingula and right middle lobe. A  representative nodule in the right base on image 20 measures 11 x 10 mm today versus 18 by 15 mm previously. There has been overall improvement of the opacity in the left base. There is apparent cavitation associated with some of the nodules as they contain central air. No other changes in the lung bases. Hepatobiliary: No focal liver abnormality is seen. No gallstones, gallbladder wall thickening, or biliary dilatation. Pancreas: Unremarkable. No pancreatic ductal dilatation or surrounding inflammatory changes. Spleen: Normal in size without focal abnormality. Adrenals/Urinary Tract: Minimal thickening in the inferior right adrenal gland on CT image 26 is too small to characterize measuring 8 mm. This is unchanged since May of 2018 when the attenuation was less than 0 Hounsfield units, consistent with a small adenoma. Right adrenal gland is unremarkable. No suspicious renal masses. No stones or hydronephrosis. No ureterectasis or ureteral stones. The bladder is partially obscured by streak artifact but grossly unremarkable. Stomach/Bowel: The stomach and small bowel are normal. Sigmoid diverticuli are identified without evidence of acute diverticulitis. Other than scattered diverticuli, the colon is unremarkable. The appendix is well visualized with no appendicitis. Vascular/Lymphatic: Atherosclerotic changes in the non aneurysmal aorta, iliac vessels, and femoral vessels. No adenopathy. Reproductive: Prostate is unremarkable. Other: No free air or free fluid. Musculoskeletal: The patient is status post right hip replacement. Degenerative changes are seen in the spine. No bony metastatic disease is noted. IMPRESSION: 1. Persistent consolidation of the right lower lobe. 2. Patchy irregular nodules in the lung bases. Some of the nodules now demonstrate evidence of central cavitation with central foci of air. Overall, the opacities and some of the nodules have improved in the left base. A representative nodule in the  right base is also improved. A neoplastic process is not excluded but the apparent interval improvement would suggests the possibility of an infectious or inflammatory process. Recommend pulmonary consultation. The patient may benefit from bronchoscopy/sampling. 3. No evidence of malignancy within the abdomen or pelvis. 4. Atherosclerosis. Electronically Signed   By: Dorise Bullion III M.D   On: 09/25/2016 14:11   Dg Swallowing Func-speech Pathology  Result Date: 09/22/2016 Objective Swallowing Evaluation: Type of Study: MBS-Modified Barium Swallow Study Patient Details Name: William Kirk MRN: [id number] Date of Birth: May 01, 1940 Today's Date: 09/22/2016 Time: SLP Start Time (ACUTE ONLY): 1135-SLP Stop Time (ACUTE ONLY): 1155 SLP Time Calculation (min) (ACUTE ONLY): 20 min Past Medical History: Past Medical History: Diagnosis Date . Arthritis   "hands" (09/21/2016) . Bleeding stomach ulcer 2000s . COPD (chronic obstructive pulmonary disease) ([place])  . Depression  . Pneumonia   "now and several times before this" (09/21/2016) . PVD (peripheral vascular disease) ([place]) 2014  prev PTCA on RLE Past Surgical History: Past Surgical History: Procedure Laterality Date . JOINT REPLACEMENT   . KNEE ARTHROSCOPY Right X 1 . TOTAL HIP ARTHROPLASTY Right 2009 HPI: William Kirk a 76 y.o.malewith medical history significant of formal smoker, COPD, pna (May 2018), depression, PVD, arthritis, who presents with productive cough, shortness breath and weight loss (30 pounds since May 2018). His pulmonologist, Dr. Neldon Labella in [facility] scheduled him for "endoscopy" on 10/03/16 due to posssible aspiration. Patient states that he had approximately 30 pounds of weight loss since May. CT angiogram of chest is negative  chest Jun 21, 2016 and August 2015. FINDINGS: Lower chest: Dense consolidation in the right lower lobe is identified. Multiple irregular nodules are seen in the lung bases with involvement also seen in the lingula and right middle lobe. A  representative nodule in the right base on image 20 measures 11 x 10 mm today versus 18 by 15 mm previously. There has been overall improvement of the opacity in the left base. There is apparent cavitation associated with some of the nodules as they contain central air. No other changes in the lung bases. Hepatobiliary: No focal liver abnormality is seen. No gallstones, gallbladder wall thickening, or biliary dilatation. Pancreas: Unremarkable. No pancreatic ductal dilatation or surrounding inflammatory changes. Spleen: Normal in size without focal abnormality. Adrenals/Urinary Tract: Minimal thickening in the inferior right adrenal gland on CT image 26 is too small to characterize measuring 8 mm. This is unchanged since May of 2018 when the attenuation was less than 0 Hounsfield units, consistent with a small adenoma. Right adrenal gland is unremarkable. No suspicious renal masses. No stones or hydronephrosis. No ureterectasis or ureteral stones. The bladder is partially obscured by streak artifact but grossly unremarkable. Stomach/Bowel: The stomach and small bowel are normal. Sigmoid diverticuli are identified without evidence of acute diverticulitis. Other than scattered diverticuli, the colon is unremarkable. The appendix is well visualized with no appendicitis. Vascular/Lymphatic: Atherosclerotic changes in the non aneurysmal aorta, iliac vessels, and femoral vessels. No adenopathy. Reproductive: Prostate is unremarkable. Other: No free air or free fluid. Musculoskeletal: The patient is status post right hip replacement. Degenerative changes are seen in the spine. No bony metastatic disease is noted. IMPRESSION: 1. Persistent consolidation of the right lower lobe. 2. Patchy irregular nodules in the lung bases. Some of the nodules now demonstrate evidence of central cavitation with central foci of air. Overall, the opacities and some of the nodules have improved in the left base. A representative nodule in the  right base is also improved. A neoplastic process is not excluded but the apparent interval improvement would suggests the possibility of an infectious or inflammatory process. Recommend pulmonary consultation. The patient may benefit from bronchoscopy/sampling. 3. No evidence of malignancy within the abdomen or pelvis. 4. Atherosclerosis. Electronically Signed   By: Dorise Bullion III M.D   On: 09/25/2016 14:11   Dg Swallowing Func-speech Pathology  Result Date: 09/22/2016 Objective Swallowing Evaluation: Type of Study: MBS-Modified Barium Swallow Study Patient Details Name: William Kirk MRN: [id number] Date of Birth: May 01, 1940 Today's Date: 09/22/2016 Time: SLP Start Time (ACUTE ONLY): 1135-SLP Stop Time (ACUTE ONLY): 1155 SLP Time Calculation (min) (ACUTE ONLY): 20 min Past Medical History: Past Medical History: Diagnosis Date . Arthritis   "hands" (09/21/2016) . Bleeding stomach ulcer 2000s . COPD (chronic obstructive pulmonary disease) ([place])  . Depression  . Pneumonia   "now and several times before this" (09/21/2016) . PVD (peripheral vascular disease) ([place]) 2014  prev PTCA on RLE Past Surgical History: Past Surgical History: Procedure Laterality Date . JOINT REPLACEMENT   . KNEE ARTHROSCOPY Right X 1 . TOTAL HIP ARTHROPLASTY Right 2009 HPI: William Kirk a 76 y.o.malewith medical history significant of formal smoker, COPD, pna (May 2018), depression, PVD, arthritis, who presents with productive cough, shortness breath and weight loss (30 pounds since May 2018). His pulmonologist, Dr. Neldon Labella in [facility] scheduled him for "endoscopy" on 10/03/16 due to posssible aspiration. Patient states that he had approximately 30 pounds of weight loss since May. CT angiogram of chest is negative  Physician Discharge Summary  William Kirk IBB:[id number] DOB: 1940-05-12 DOA: 09/21/2016  PCP: [facility], [facility]  Admit date: 09/21/2016 Discharge date: 09/26/2016  Admitted From: Home  Disposition: Home   Recommendations for Outpatient Follow-up:  1. Follow up with PCP in 1- week 2. Patient will follow up with Dr. Melvyn Novas at the pulmonary clinic on October 1st, he will need a non contrast chest CT, to evaluate resolution of the pulmonary infiltrates, and to evaluate the need of diagnostic bronchoscopy. 3. Patient placed on levofloxacin for the next 5 days 4. Home 02 screen ordered 5. Continue to use incentive spirometer and flutter valve  Home Health: Yes  Equipment/Devices: NA  Discharge Condition: Stable CODE STATUS: full  Diet recommendation:  Diet recommendations: Dysphagia 3 (mechanical soft);Nectar-thick liquid Liquids provided via: Cup;No straw Medication Administration: Whole meds with puree Supervision: Patient able to self feed;Full supervision/cueing for compensatory strategies Compensations: Slow rate;Small sips/bites;Chin tuck Postural Changes and/or Swallow Maneuvers: Seated upright 90 degrees  Brief/Interim Summary: 76 year old male presented with productive cough, dyspnea and weight loss. Patient is known to have COPD, tobacco abuse, arthritis, and depression. He developed dyspnea for last 2 days prior to hospitalization, associated with greenish productive sputum, intermittent fevers and generalized weakness. He has been worked up for swallowing dysfunction as an outpatient. On initial physical examination his oxygen saturation was found to be 80% on 6 L per nasal cannula, blood pressure 114/53, heart rate 85-93, respiratory rate 21-30. Moist mucous membranes, heart S1-S2 present rhythmic, no gallops, rubs murmurs, lungs with scattered rails, wheezing and rhonchi bilaterally, abdomen soft nontender, lower extremities no edema. Sodium 136, potassium 2.7, chloride  95, bicarbonate 30, glucose 135, BUN 26, creatinine 0.92, white cell count 9.3, hemoglobin 10.5, hematocrit 33.5, platelets 551. EKG normal sinus rhythm, rate 82 bpm, normal axis, normal intervals. His x-ray with new left upper lobe infiltrate, alveolar, loss lung volume right lower lobe. CT chest with no pulmonary embolism, collapse of the right lower lobe, with eft upper and lower lobe infiltrates.   Patient was admitted to the hospital working diagnosis of acute hypoxic failure due to multifocal pneumonia  1. Multilobar aspiration pneumonia, predominantly left lower lobe, present on admission, complicated by acute hypoxic respiratory failure. Patient was admitted to the stepdown unit, placed on supplemental oxygen per high flow nasal cannula, antibiotic therapy with vancomycin and Zosyn intravenously. He was seen by speech therapy, findings positive for swallow dysfunction, diet  was changed to dysphagia 3 with good toleration. Patient improved to the medical therapy, the antibiotic therapy was descalated to Zosyn, and he will be discharged on levofloxacin for 5 more days. He had aggressive pulmonary toileting with chest PT, flutter valve and incentive spirometry. He will have a home oxygen screen before discharge. He was instructed about aspiration precautions.  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 Need to rule out underlying malignancy or mycobacterial infection. Case was discussed with Dr. Ashok Cordia, with recommendations to follow-up as an outpatient in 4-6 weeks, then he will need a repeat chest CT, noncontrast, and evaluate for the need of diagnostic bronchoscopy. Patient does have a pulmonary specialist at the [place] but at this point is willing to follow-up with [facility], for post hospitalization follow up, appointment made for 11/07/2016.   2. COPD exacerbation. Patient was  placed on bronchodilator therapy, oximetry monitoring and supplemental oxygen per nasal cannula. He received a short course of IV  chest Jun 21, 2016 and August 2015. FINDINGS: Lower chest: Dense consolidation in the right lower lobe is identified. Multiple irregular nodules are seen in the lung bases with involvement also seen in the lingula and right middle lobe. A  representative nodule in the right base on image 20 measures 11 x 10 mm today versus 18 by 15 mm previously. There has been overall improvement of the opacity in the left base. There is apparent cavitation associated with some of the nodules as they contain central air. No other changes in the lung bases. Hepatobiliary: No focal liver abnormality is seen. No gallstones, gallbladder wall thickening, or biliary dilatation. Pancreas: Unremarkable. No pancreatic ductal dilatation or surrounding inflammatory changes. Spleen: Normal in size without focal abnormality. Adrenals/Urinary Tract: Minimal thickening in the inferior right adrenal gland on CT image 26 is too small to characterize measuring 8 mm. This is unchanged since May of 2018 when the attenuation was less than 0 Hounsfield units, consistent with a small adenoma. Right adrenal gland is unremarkable. No suspicious renal masses. No stones or hydronephrosis. No ureterectasis or ureteral stones. The bladder is partially obscured by streak artifact but grossly unremarkable. Stomach/Bowel: The stomach and small bowel are normal. Sigmoid diverticuli are identified without evidence of acute diverticulitis. Other than scattered diverticuli, the colon is unremarkable. The appendix is well visualized with no appendicitis. Vascular/Lymphatic: Atherosclerotic changes in the non aneurysmal aorta, iliac vessels, and femoral vessels. No adenopathy. Reproductive: Prostate is unremarkable. Other: No free air or free fluid. Musculoskeletal: The patient is status post right hip replacement. Degenerative changes are seen in the spine. No bony metastatic disease is noted. IMPRESSION: 1. Persistent consolidation of the right lower lobe. 2. Patchy irregular nodules in the lung bases. Some of the nodules now demonstrate evidence of central cavitation with central foci of air. Overall, the opacities and some of the nodules have improved in the left base. A representative nodule in the  right base is also improved. A neoplastic process is not excluded but the apparent interval improvement would suggests the possibility of an infectious or inflammatory process. Recommend pulmonary consultation. The patient may benefit from bronchoscopy/sampling. 3. No evidence of malignancy within the abdomen or pelvis. 4. Atherosclerosis. Electronically Signed   By: Dorise Bullion III M.D   On: 09/25/2016 14:11   Dg Swallowing Func-speech Pathology  Result Date: 09/22/2016 Objective Swallowing Evaluation: Type of Study: MBS-Modified Barium Swallow Study Patient Details Name: William Kirk MRN: [id number] Date of Birth: May 01, 1940 Today's Date: 09/22/2016 Time: SLP Start Time (ACUTE ONLY): 1135-SLP Stop Time (ACUTE ONLY): 1155 SLP Time Calculation (min) (ACUTE ONLY): 20 min Past Medical History: Past Medical History: Diagnosis Date . Arthritis   "hands" (09/21/2016) . Bleeding stomach ulcer 2000s . COPD (chronic obstructive pulmonary disease) ([place])  . Depression  . Pneumonia   "now and several times before this" (09/21/2016) . PVD (peripheral vascular disease) ([place]) 2014  prev PTCA on RLE Past Surgical History: Past Surgical History: Procedure Laterality Date . JOINT REPLACEMENT   . KNEE ARTHROSCOPY Right X 1 . TOTAL HIP ARTHROPLASTY Right 2009 HPI: William Kirk a 76 y.o.malewith medical history significant of formal smoker, COPD, pna (May 2018), depression, PVD, arthritis, who presents with productive cough, shortness breath and weight loss (30 pounds since May 2018). His pulmonologist, Dr. Neldon Labella in [facility] scheduled him for "endoscopy" on 10/03/16 due to posssible aspiration. Patient states that he had approximately 30 pounds of weight loss since May. CT angiogram of chest is negative  chest Jun 21, 2016 and August 2015. FINDINGS: Lower chest: Dense consolidation in the right lower lobe is identified. Multiple irregular nodules are seen in the lung bases with involvement also seen in the lingula and right middle lobe. A  representative nodule in the right base on image 20 measures 11 x 10 mm today versus 18 by 15 mm previously. There has been overall improvement of the opacity in the left base. There is apparent cavitation associated with some of the nodules as they contain central air. No other changes in the lung bases. Hepatobiliary: No focal liver abnormality is seen. No gallstones, gallbladder wall thickening, or biliary dilatation. Pancreas: Unremarkable. No pancreatic ductal dilatation or surrounding inflammatory changes. Spleen: Normal in size without focal abnormality. Adrenals/Urinary Tract: Minimal thickening in the inferior right adrenal gland on CT image 26 is too small to characterize measuring 8 mm. This is unchanged since May of 2018 when the attenuation was less than 0 Hounsfield units, consistent with a small adenoma. Right adrenal gland is unremarkable. No suspicious renal masses. No stones or hydronephrosis. No ureterectasis or ureteral stones. The bladder is partially obscured by streak artifact but grossly unremarkable. Stomach/Bowel: The stomach and small bowel are normal. Sigmoid diverticuli are identified without evidence of acute diverticulitis. Other than scattered diverticuli, the colon is unremarkable. The appendix is well visualized with no appendicitis. Vascular/Lymphatic: Atherosclerotic changes in the non aneurysmal aorta, iliac vessels, and femoral vessels. No adenopathy. Reproductive: Prostate is unremarkable. Other: No free air or free fluid. Musculoskeletal: The patient is status post right hip replacement. Degenerative changes are seen in the spine. No bony metastatic disease is noted. IMPRESSION: 1. Persistent consolidation of the right lower lobe. 2. Patchy irregular nodules in the lung bases. Some of the nodules now demonstrate evidence of central cavitation with central foci of air. Overall, the opacities and some of the nodules have improved in the left base. A representative nodule in the  right base is also improved. A neoplastic process is not excluded but the apparent interval improvement would suggests the possibility of an infectious or inflammatory process. Recommend pulmonary consultation. The patient may benefit from bronchoscopy/sampling. 3. No evidence of malignancy within the abdomen or pelvis. 4. Atherosclerosis. Electronically Signed   By: Dorise Bullion III M.D   On: 09/25/2016 14:11   Dg Swallowing Func-speech Pathology  Result Date: 09/22/2016 Objective Swallowing Evaluation: Type of Study: MBS-Modified Barium Swallow Study Patient Details Name: William Kirk MRN: [id number] Date of Birth: May 01, 1940 Today's Date: 09/22/2016 Time: SLP Start Time (ACUTE ONLY): 1135-SLP Stop Time (ACUTE ONLY): 1155 SLP Time Calculation (min) (ACUTE ONLY): 20 min Past Medical History: Past Medical History: Diagnosis Date . Arthritis   "hands" (09/21/2016) . Bleeding stomach ulcer 2000s . COPD (chronic obstructive pulmonary disease) ([place])  . Depression  . Pneumonia   "now and several times before this" (09/21/2016) . PVD (peripheral vascular disease) ([place]) 2014  prev PTCA on RLE Past Surgical History: Past Surgical History: Procedure Laterality Date . JOINT REPLACEMENT   . KNEE ARTHROSCOPY Right X 1 . TOTAL HIP ARTHROPLASTY Right 2009 HPI: William Kirk a 76 y.o.malewith medical history significant of formal smoker, COPD, pna (May 2018), depression, PVD, arthritis, who presents with productive cough, shortness breath and weight loss (30 pounds since May 2018). His pulmonologist, Dr. Neldon Labella in [facility] scheduled him for "endoscopy" on 10/03/16 due to posssible aspiration. Patient states that he had approximately 30 pounds of weight loss since May. CT angiogram of chest is negative

## 2016-09-26 NOTE — Progress Notes (Signed)
  Echocardiogram 2D Echocardiogram has been performed.  Darlina Sicilian M 09/26/2016, 2:15 PM

## 2016-09-26 NOTE — Care Management Important Message (Signed)
Important Message  Patient Details  Name: William Kirk MRN: [id number] Date of Birth: December 13, 1940   Medicare Important Message Given:  Yes    Nathen May 09/26/2016, 11:32 AM

## 2016-09-26 NOTE — Progress Notes (Signed)
Pt d/c home per MD order, pt VSS, pt d/c instructions given with family at [facility], pt verbalized understanding of d/c all questions answered

## 2016-09-26 NOTE — Progress Notes (Signed)
ANTIBIOTIC CONSULT NOTE - INITIAL  Pharmacy Consult for Levaquin Indication: pneumonia  No Known Allergies  Patient Measurements: Height: 5\' 10"  (177.8 cm) Weight: 147 lb 3.2 oz (66.8 kg) IBW/kg (Calculated) : 73 Adjusted Body Weight:    Vital Signs: Temp: 97 F (36.1 C) (08/20 0449) Temp Source: Axillary (08/20 0449) BP: 132/64 (08/20 0500) Pulse Rate: 73 (08/20 0539) Intake/Output from previous day: 08/19 0701 - 08/20 0700 In: -  Out: 800 [Urine:800] Intake/Output from this shift: Total I/O In: 118 [P.O.:118] Out: 100 [Urine:100]  Labs:  Recent Labs  09/24/16 0405 09/24/16 1220 09/25/16 0249 09/26/16 0215  WBC 13.6* 13.7* 11.3* 12.3*  HGB 9.8* 10.7* 10.0* 10.5*  PLT 565* 587* 552* 514*  CREATININE 0.60*  --  0.55* 0.67   Estimated Creatinine Clearance: 76.5 mL/min (by C-G formula based on SCr of 0.67 mg/dL). No results for input(s): VANCOTROUGH, VANCOPEAK, VANCORANDOM, GENTTROUGH, GENTPEAK, GENTRANDOM, TOBRATROUGH, TOBRAPEAK, TOBRARND, AMIKACINPEAK, AMIKACINTROU, AMIKACIN in the last 72 hours.   Microbiology:   Medical History: Past Medical History:  Diagnosis Date  . Arthritis    "hands" (09/21/2016)  . Bleeding stomach ulcer 2000s  . COPD (chronic obstructive pulmonary disease) ([place])   . Depression   . Pneumonia    "now and several times before this" (09/21/2016)  . PVD (peripheral vascular disease) ([place]) 2014   prev PTCA on RLE    Assessment:  ID: day # 5 ABX for pneumonia - HCAP vs aspiration. Hx PNA in May (at [facility] 5/14-17, got  Zosyn + Vanc 750 q8h 5/15-17, Scr was 0.68, no levels; then sent home on LVQ x 10 days. For f/u at [facility])  -Afeb, WBC 19>11>12.3 (on steroids), LA 1.63>0.8, PCT 0.36, Renal WNL  Vancomycin 8/15 >>8/16 Zosyn 8/15 >>8/20 Doxycycline 8/16>>8/16 Levaquin 8/20>>  8/15 Blood x 2 - Negative 8/15 HIV - non-reactive 8/15 Strep pneumo Ag - negative  8/15 Legionella Ag -  8/15 respiratory panel - negative 8/?? Sputum - no  sample yet - per 5/17 dc summary - hx Pseudomonas is sputum - sens Cipro but failed it. Prior [place]  Goal of Therapy:  Eradication of infection  Plan:  D/c Zosyn Levaquin 750mg  po q 24h Restart Tobi nebs 8/24?   William Kirk, PharmD, BCPS Clinical Staff Pharmacist Pager [phone]  William Kirk 09/26/2016,11:45 AM

## 2016-09-26 NOTE — Care Management Note (Addendum)
Case Management Note Marvetta Gibbons RN, BSN Unit 4E-Case Manager [phone]  Patient Details  Name: William Kirk MRN: [id number] Date of Birth: [birth year]  Subjective/Objective:   Pt admitted with PNA/sepsis- acute resp. failure                 Action/Plan: PTA pt lived at home with wife- is followed by PCP at the [facility] clinic- is not on home 02, is independent at baseline-  CM will follow for d/c needs   Expected Discharge Date:  09/24/16               Expected Discharge Plan:  [place]  In-House Referral:     Discharge planning Services  CM Consult  Post Acute Care Choice:  Home Health, Durable Medical Equipment Choice offered to:  Patient, Spouse  DME Arranged:  Oxygen DME Agency:  [place] Arranged:  RN, PT, Respirator Therapy [place] Agency:  [place]  Status of Service:  Completed, signed off  If discussed at [place] of Stay Meetings, dates discussed:    Discharge Disposition: home/home health   Additional Comments:  09/26/16- 1130- Marvetta Gibbons RN, CM- pt for d/c home today - [place] orders have been placed- spoke with pt and wife at bedside- choice offered for [facility] agency in [facility]- per choice they would like to use [facility] for services- pt may also need home 02- bedside RN to do ambulatory sat to see if pt qualifies. If pt needs home 02 will also use [facility] for this. Referral called to karen with [facility] for [facility] needs and possible home 02- referral has been accepted.  PCP at [facility] clinic- is Dr Romero Liner and Fatima Sanger. Is Dr. Gwenette Greet.  Update 6789- pt has qualified for home 02- MD has placed home 02 orders- have notified karen with [facility] for DME needs- portable tank to be delivered to room prior to discharge.   Dawayne Patricia, RN 09/26/2016, 1:58 PM

## 2016-09-26 NOTE — Progress Notes (Signed)
Patient slept very well this shift. Denies any pain. Titrated oxygen to 2L with saturation at 95%. Denies any SOB. Still unsteady with his gait. Bed alarm on for safety.

## 2016-09-26 NOTE — Progress Notes (Signed)
SATURATION QUALIFICATIONS: (This note is used to comply with regulatory documentation for home oxygen)  Patient Saturations on Room Air at Rest = 92%  Patient Saturations on Room Air while Ambulating = 79%  Patient Saturations on 2 Liters of oxygen while Ambulating = 90%  Please briefly explain why patient needs home oxygen:

## 2016-09-27 DIAGNOSIS — E43 Unspecified severe protein-calorie malnutrition: Secondary | ICD-10-CM | POA: Diagnosis not present

## 2016-09-27 DIAGNOSIS — J441 Chronic obstructive pulmonary disease with (acute) exacerbation: Secondary | ICD-10-CM | POA: Diagnosis not present

## 2016-09-27 DIAGNOSIS — I10 Essential (primary) hypertension: Secondary | ICD-10-CM | POA: Diagnosis not present

## 2016-09-27 DIAGNOSIS — J9601 Acute respiratory failure with hypoxia: Secondary | ICD-10-CM | POA: Diagnosis not present

## 2016-09-27 DIAGNOSIS — I739 Peripheral vascular disease, unspecified: Secondary | ICD-10-CM | POA: Diagnosis not present

## 2016-09-27 DIAGNOSIS — J440 Chronic obstructive pulmonary disease with acute lower respiratory infection: Secondary | ICD-10-CM | POA: Diagnosis not present

## 2016-09-27 DIAGNOSIS — Z9981 Dependence on supplemental oxygen: Secondary | ICD-10-CM | POA: Diagnosis not present

## 2016-09-27 DIAGNOSIS — J189 Pneumonia, unspecified organism: Secondary | ICD-10-CM | POA: Diagnosis not present

## 2016-09-27 DIAGNOSIS — F329 Major depressive disorder, single episode, unspecified: Secondary | ICD-10-CM | POA: Diagnosis not present

## 2016-09-29 DIAGNOSIS — F329 Major depressive disorder, single episode, unspecified: Secondary | ICD-10-CM | POA: Diagnosis not present

## 2016-09-29 DIAGNOSIS — J9601 Acute respiratory failure with hypoxia: Secondary | ICD-10-CM | POA: Diagnosis not present

## 2016-09-29 DIAGNOSIS — Z9981 Dependence on supplemental oxygen: Secondary | ICD-10-CM | POA: Diagnosis not present

## 2016-09-29 DIAGNOSIS — J440 Chronic obstructive pulmonary disease with acute lower respiratory infection: Secondary | ICD-10-CM | POA: Diagnosis not present

## 2016-09-29 DIAGNOSIS — J189 Pneumonia, unspecified organism: Secondary | ICD-10-CM | POA: Diagnosis not present

## 2016-09-29 DIAGNOSIS — I10 Essential (primary) hypertension: Secondary | ICD-10-CM | POA: Diagnosis not present

## 2016-09-29 DIAGNOSIS — J441 Chronic obstructive pulmonary disease with (acute) exacerbation: Secondary | ICD-10-CM | POA: Diagnosis not present

## 2016-09-29 DIAGNOSIS — E43 Unspecified severe protein-calorie malnutrition: Secondary | ICD-10-CM | POA: Diagnosis not present

## 2016-09-29 DIAGNOSIS — I739 Peripheral vascular disease, unspecified: Secondary | ICD-10-CM | POA: Diagnosis not present

## 2016-10-04 DIAGNOSIS — J189 Pneumonia, unspecified organism: Secondary | ICD-10-CM | POA: Diagnosis not present

## 2016-10-04 DIAGNOSIS — I739 Peripheral vascular disease, unspecified: Secondary | ICD-10-CM | POA: Diagnosis not present

## 2016-10-04 DIAGNOSIS — E43 Unspecified severe protein-calorie malnutrition: Secondary | ICD-10-CM | POA: Diagnosis not present

## 2016-10-04 DIAGNOSIS — F329 Major depressive disorder, single episode, unspecified: Secondary | ICD-10-CM | POA: Diagnosis not present

## 2016-10-04 DIAGNOSIS — J441 Chronic obstructive pulmonary disease with (acute) exacerbation: Secondary | ICD-10-CM | POA: Diagnosis not present

## 2016-10-04 DIAGNOSIS — I10 Essential (primary) hypertension: Secondary | ICD-10-CM | POA: Diagnosis not present

## 2016-10-04 DIAGNOSIS — Z9981 Dependence on supplemental oxygen: Secondary | ICD-10-CM | POA: Diagnosis not present

## 2016-10-04 DIAGNOSIS — J440 Chronic obstructive pulmonary disease with acute lower respiratory infection: Secondary | ICD-10-CM | POA: Diagnosis not present

## 2016-10-04 DIAGNOSIS — J9601 Acute respiratory failure with hypoxia: Secondary | ICD-10-CM | POA: Diagnosis not present

## 2016-10-06 DIAGNOSIS — I10 Essential (primary) hypertension: Secondary | ICD-10-CM | POA: Diagnosis not present

## 2016-10-06 DIAGNOSIS — E43 Unspecified severe protein-calorie malnutrition: Secondary | ICD-10-CM | POA: Diagnosis not present

## 2016-10-06 DIAGNOSIS — Z9981 Dependence on supplemental oxygen: Secondary | ICD-10-CM | POA: Diagnosis not present

## 2016-10-06 DIAGNOSIS — I739 Peripheral vascular disease, unspecified: Secondary | ICD-10-CM | POA: Diagnosis not present

## 2016-10-06 DIAGNOSIS — J189 Pneumonia, unspecified organism: Secondary | ICD-10-CM | POA: Diagnosis not present

## 2016-10-06 DIAGNOSIS — J440 Chronic obstructive pulmonary disease with acute lower respiratory infection: Secondary | ICD-10-CM | POA: Diagnosis not present

## 2016-10-06 DIAGNOSIS — J9601 Acute respiratory failure with hypoxia: Secondary | ICD-10-CM | POA: Diagnosis not present

## 2016-10-06 DIAGNOSIS — F329 Major depressive disorder, single episode, unspecified: Secondary | ICD-10-CM | POA: Diagnosis not present

## 2016-10-06 DIAGNOSIS — J441 Chronic obstructive pulmonary disease with (acute) exacerbation: Secondary | ICD-10-CM | POA: Diagnosis not present

## 2016-10-07 DIAGNOSIS — J189 Pneumonia, unspecified organism: Secondary | ICD-10-CM | POA: Diagnosis not present

## 2016-10-07 DIAGNOSIS — J440 Chronic obstructive pulmonary disease with acute lower respiratory infection: Secondary | ICD-10-CM | POA: Diagnosis not present

## 2016-10-07 DIAGNOSIS — I739 Peripheral vascular disease, unspecified: Secondary | ICD-10-CM | POA: Diagnosis not present

## 2016-10-07 DIAGNOSIS — J9601 Acute respiratory failure with hypoxia: Secondary | ICD-10-CM | POA: Diagnosis not present

## 2016-10-07 DIAGNOSIS — I10 Essential (primary) hypertension: Secondary | ICD-10-CM | POA: Diagnosis not present

## 2016-10-07 DIAGNOSIS — J441 Chronic obstructive pulmonary disease with (acute) exacerbation: Secondary | ICD-10-CM | POA: Diagnosis not present

## 2016-10-07 DIAGNOSIS — E43 Unspecified severe protein-calorie malnutrition: Secondary | ICD-10-CM | POA: Diagnosis not present

## 2016-10-07 DIAGNOSIS — Z9981 Dependence on supplemental oxygen: Secondary | ICD-10-CM | POA: Diagnosis not present

## 2016-10-07 DIAGNOSIS — F329 Major depressive disorder, single episode, unspecified: Secondary | ICD-10-CM | POA: Diagnosis not present

## 2016-10-11 DIAGNOSIS — J189 Pneumonia, unspecified organism: Secondary | ICD-10-CM | POA: Diagnosis not present

## 2016-10-11 DIAGNOSIS — I10 Essential (primary) hypertension: Secondary | ICD-10-CM | POA: Diagnosis not present

## 2016-10-11 DIAGNOSIS — J441 Chronic obstructive pulmonary disease with (acute) exacerbation: Secondary | ICD-10-CM | POA: Diagnosis not present

## 2016-10-11 DIAGNOSIS — Z9981 Dependence on supplemental oxygen: Secondary | ICD-10-CM | POA: Diagnosis not present

## 2016-10-11 DIAGNOSIS — J9601 Acute respiratory failure with hypoxia: Secondary | ICD-10-CM | POA: Diagnosis not present

## 2016-10-11 DIAGNOSIS — E43 Unspecified severe protein-calorie malnutrition: Secondary | ICD-10-CM | POA: Diagnosis not present

## 2016-10-11 DIAGNOSIS — I739 Peripheral vascular disease, unspecified: Secondary | ICD-10-CM | POA: Diagnosis not present

## 2016-10-11 DIAGNOSIS — J440 Chronic obstructive pulmonary disease with acute lower respiratory infection: Secondary | ICD-10-CM | POA: Diagnosis not present

## 2016-10-11 DIAGNOSIS — F329 Major depressive disorder, single episode, unspecified: Secondary | ICD-10-CM | POA: Diagnosis not present

## 2016-10-13 DIAGNOSIS — I10 Essential (primary) hypertension: Secondary | ICD-10-CM | POA: Diagnosis not present

## 2016-10-13 DIAGNOSIS — J441 Chronic obstructive pulmonary disease with (acute) exacerbation: Secondary | ICD-10-CM | POA: Diagnosis not present

## 2016-10-13 DIAGNOSIS — F329 Major depressive disorder, single episode, unspecified: Secondary | ICD-10-CM | POA: Diagnosis not present

## 2016-10-13 DIAGNOSIS — E43 Unspecified severe protein-calorie malnutrition: Secondary | ICD-10-CM | POA: Diagnosis not present

## 2016-10-13 DIAGNOSIS — Z9981 Dependence on supplemental oxygen: Secondary | ICD-10-CM | POA: Diagnosis not present

## 2016-10-13 DIAGNOSIS — J440 Chronic obstructive pulmonary disease with acute lower respiratory infection: Secondary | ICD-10-CM | POA: Diagnosis not present

## 2016-10-13 DIAGNOSIS — I739 Peripheral vascular disease, unspecified: Secondary | ICD-10-CM | POA: Diagnosis not present

## 2016-10-13 DIAGNOSIS — J9601 Acute respiratory failure with hypoxia: Secondary | ICD-10-CM | POA: Diagnosis not present

## 2016-10-13 DIAGNOSIS — J189 Pneumonia, unspecified organism: Secondary | ICD-10-CM | POA: Diagnosis not present

## 2016-10-19 DIAGNOSIS — J9601 Acute respiratory failure with hypoxia: Secondary | ICD-10-CM | POA: Diagnosis not present

## 2016-10-19 DIAGNOSIS — Z9981 Dependence on supplemental oxygen: Secondary | ICD-10-CM | POA: Diagnosis not present

## 2016-10-19 DIAGNOSIS — I739 Peripheral vascular disease, unspecified: Secondary | ICD-10-CM | POA: Diagnosis not present

## 2016-10-19 DIAGNOSIS — J189 Pneumonia, unspecified organism: Secondary | ICD-10-CM | POA: Diagnosis not present

## 2016-10-19 DIAGNOSIS — J440 Chronic obstructive pulmonary disease with acute lower respiratory infection: Secondary | ICD-10-CM | POA: Diagnosis not present

## 2016-10-19 DIAGNOSIS — J441 Chronic obstructive pulmonary disease with (acute) exacerbation: Secondary | ICD-10-CM | POA: Diagnosis not present

## 2016-10-19 DIAGNOSIS — E43 Unspecified severe protein-calorie malnutrition: Secondary | ICD-10-CM | POA: Diagnosis not present

## 2016-10-19 DIAGNOSIS — F329 Major depressive disorder, single episode, unspecified: Secondary | ICD-10-CM | POA: Diagnosis not present

## 2016-10-19 DIAGNOSIS — I10 Essential (primary) hypertension: Secondary | ICD-10-CM | POA: Diagnosis not present

## 2016-10-20 DIAGNOSIS — J441 Chronic obstructive pulmonary disease with (acute) exacerbation: Secondary | ICD-10-CM | POA: Diagnosis not present

## 2016-10-20 DIAGNOSIS — J189 Pneumonia, unspecified organism: Secondary | ICD-10-CM | POA: Diagnosis not present

## 2016-10-20 DIAGNOSIS — F329 Major depressive disorder, single episode, unspecified: Secondary | ICD-10-CM | POA: Diagnosis not present

## 2016-10-20 DIAGNOSIS — I10 Essential (primary) hypertension: Secondary | ICD-10-CM | POA: Diagnosis not present

## 2016-10-20 DIAGNOSIS — J9601 Acute respiratory failure with hypoxia: Secondary | ICD-10-CM | POA: Diagnosis not present

## 2016-10-20 DIAGNOSIS — Z9981 Dependence on supplemental oxygen: Secondary | ICD-10-CM | POA: Diagnosis not present

## 2016-10-20 DIAGNOSIS — J440 Chronic obstructive pulmonary disease with acute lower respiratory infection: Secondary | ICD-10-CM | POA: Diagnosis not present

## 2016-10-20 DIAGNOSIS — E43 Unspecified severe protein-calorie malnutrition: Secondary | ICD-10-CM | POA: Diagnosis not present

## 2016-10-20 DIAGNOSIS — I739 Peripheral vascular disease, unspecified: Secondary | ICD-10-CM | POA: Diagnosis not present

## 2016-10-25 DIAGNOSIS — Z9981 Dependence on supplemental oxygen: Secondary | ICD-10-CM | POA: Diagnosis not present

## 2016-10-25 DIAGNOSIS — J9601 Acute respiratory failure with hypoxia: Secondary | ICD-10-CM | POA: Diagnosis not present

## 2016-10-25 DIAGNOSIS — E43 Unspecified severe protein-calorie malnutrition: Secondary | ICD-10-CM | POA: Diagnosis not present

## 2016-10-25 DIAGNOSIS — J441 Chronic obstructive pulmonary disease with (acute) exacerbation: Secondary | ICD-10-CM | POA: Diagnosis not present

## 2016-10-25 DIAGNOSIS — I739 Peripheral vascular disease, unspecified: Secondary | ICD-10-CM | POA: Diagnosis not present

## 2016-10-25 DIAGNOSIS — J189 Pneumonia, unspecified organism: Secondary | ICD-10-CM | POA: Diagnosis not present

## 2016-10-25 DIAGNOSIS — I10 Essential (primary) hypertension: Secondary | ICD-10-CM | POA: Diagnosis not present

## 2016-10-25 DIAGNOSIS — J440 Chronic obstructive pulmonary disease with acute lower respiratory infection: Secondary | ICD-10-CM | POA: Diagnosis not present

## 2016-10-25 DIAGNOSIS — F329 Major depressive disorder, single episode, unspecified: Secondary | ICD-10-CM | POA: Diagnosis not present

## 2016-10-26 DIAGNOSIS — J189 Pneumonia, unspecified organism: Secondary | ICD-10-CM | POA: Diagnosis not present

## 2016-10-26 DIAGNOSIS — E43 Unspecified severe protein-calorie malnutrition: Secondary | ICD-10-CM | POA: Diagnosis not present

## 2016-10-26 DIAGNOSIS — J440 Chronic obstructive pulmonary disease with acute lower respiratory infection: Secondary | ICD-10-CM | POA: Diagnosis not present

## 2016-10-26 DIAGNOSIS — J441 Chronic obstructive pulmonary disease with (acute) exacerbation: Secondary | ICD-10-CM | POA: Diagnosis not present

## 2016-10-26 DIAGNOSIS — Z9981 Dependence on supplemental oxygen: Secondary | ICD-10-CM | POA: Diagnosis not present

## 2016-10-26 DIAGNOSIS — I739 Peripheral vascular disease, unspecified: Secondary | ICD-10-CM | POA: Diagnosis not present

## 2016-10-26 DIAGNOSIS — I10 Essential (primary) hypertension: Secondary | ICD-10-CM | POA: Diagnosis not present

## 2016-10-26 DIAGNOSIS — F329 Major depressive disorder, single episode, unspecified: Secondary | ICD-10-CM | POA: Diagnosis not present

## 2016-10-26 DIAGNOSIS — J9601 Acute respiratory failure with hypoxia: Secondary | ICD-10-CM | POA: Diagnosis not present

## 2016-10-27 DIAGNOSIS — J189 Pneumonia, unspecified organism: Secondary | ICD-10-CM | POA: Diagnosis not present

## 2016-10-27 DIAGNOSIS — F329 Major depressive disorder, single episode, unspecified: Secondary | ICD-10-CM | POA: Diagnosis not present

## 2016-10-27 DIAGNOSIS — E43 Unspecified severe protein-calorie malnutrition: Secondary | ICD-10-CM | POA: Diagnosis not present

## 2016-10-27 DIAGNOSIS — I10 Essential (primary) hypertension: Secondary | ICD-10-CM | POA: Diagnosis not present

## 2016-10-27 DIAGNOSIS — Z9981 Dependence on supplemental oxygen: Secondary | ICD-10-CM | POA: Diagnosis not present

## 2016-10-27 DIAGNOSIS — I739 Peripheral vascular disease, unspecified: Secondary | ICD-10-CM | POA: Diagnosis not present

## 2016-10-27 DIAGNOSIS — J441 Chronic obstructive pulmonary disease with (acute) exacerbation: Secondary | ICD-10-CM | POA: Diagnosis not present

## 2016-10-27 DIAGNOSIS — J440 Chronic obstructive pulmonary disease with acute lower respiratory infection: Secondary | ICD-10-CM | POA: Diagnosis not present

## 2016-10-27 DIAGNOSIS — J9601 Acute respiratory failure with hypoxia: Secondary | ICD-10-CM | POA: Diagnosis not present

## 2016-10-31 DIAGNOSIS — E43 Unspecified severe protein-calorie malnutrition: Secondary | ICD-10-CM | POA: Diagnosis not present

## 2016-10-31 DIAGNOSIS — J440 Chronic obstructive pulmonary disease with acute lower respiratory infection: Secondary | ICD-10-CM | POA: Diagnosis not present

## 2016-10-31 DIAGNOSIS — J189 Pneumonia, unspecified organism: Secondary | ICD-10-CM | POA: Diagnosis not present

## 2016-10-31 DIAGNOSIS — J9601 Acute respiratory failure with hypoxia: Secondary | ICD-10-CM | POA: Diagnosis not present

## 2016-10-31 DIAGNOSIS — I10 Essential (primary) hypertension: Secondary | ICD-10-CM | POA: Diagnosis not present

## 2016-10-31 DIAGNOSIS — I739 Peripheral vascular disease, unspecified: Secondary | ICD-10-CM | POA: Diagnosis not present

## 2016-10-31 DIAGNOSIS — F329 Major depressive disorder, single episode, unspecified: Secondary | ICD-10-CM | POA: Diagnosis not present

## 2016-10-31 DIAGNOSIS — J441 Chronic obstructive pulmonary disease with (acute) exacerbation: Secondary | ICD-10-CM | POA: Diagnosis not present

## 2016-10-31 DIAGNOSIS — Z9981 Dependence on supplemental oxygen: Secondary | ICD-10-CM | POA: Diagnosis not present

## 2016-11-02 DIAGNOSIS — J441 Chronic obstructive pulmonary disease with (acute) exacerbation: Secondary | ICD-10-CM | POA: Diagnosis not present

## 2016-11-02 DIAGNOSIS — J189 Pneumonia, unspecified organism: Secondary | ICD-10-CM | POA: Diagnosis not present

## 2016-11-02 DIAGNOSIS — J9601 Acute respiratory failure with hypoxia: Secondary | ICD-10-CM | POA: Diagnosis not present

## 2016-11-02 DIAGNOSIS — I739 Peripheral vascular disease, unspecified: Secondary | ICD-10-CM | POA: Diagnosis not present

## 2016-11-02 DIAGNOSIS — Z9981 Dependence on supplemental oxygen: Secondary | ICD-10-CM | POA: Diagnosis not present

## 2016-11-02 DIAGNOSIS — E43 Unspecified severe protein-calorie malnutrition: Secondary | ICD-10-CM | POA: Diagnosis not present

## 2016-11-02 DIAGNOSIS — I10 Essential (primary) hypertension: Secondary | ICD-10-CM | POA: Diagnosis not present

## 2016-11-02 DIAGNOSIS — J440 Chronic obstructive pulmonary disease with acute lower respiratory infection: Secondary | ICD-10-CM | POA: Diagnosis not present

## 2016-11-02 DIAGNOSIS — F329 Major depressive disorder, single episode, unspecified: Secondary | ICD-10-CM | POA: Diagnosis not present

## 2016-11-03 ENCOUNTER — Other Ambulatory Visit (HOSPITAL_COMMUNITY): Payer: Self-pay | Admitting: Pulmonary Disease

## 2016-11-03 DIAGNOSIS — R1319 Other dysphagia: Secondary | ICD-10-CM

## 2016-11-04 DIAGNOSIS — Z9981 Dependence on supplemental oxygen: Secondary | ICD-10-CM | POA: Diagnosis not present

## 2016-11-04 DIAGNOSIS — F329 Major depressive disorder, single episode, unspecified: Secondary | ICD-10-CM | POA: Diagnosis not present

## 2016-11-04 DIAGNOSIS — J189 Pneumonia, unspecified organism: Secondary | ICD-10-CM | POA: Diagnosis not present

## 2016-11-04 DIAGNOSIS — J440 Chronic obstructive pulmonary disease with acute lower respiratory infection: Secondary | ICD-10-CM | POA: Diagnosis not present

## 2016-11-04 DIAGNOSIS — I739 Peripheral vascular disease, unspecified: Secondary | ICD-10-CM | POA: Diagnosis not present

## 2016-11-04 DIAGNOSIS — J441 Chronic obstructive pulmonary disease with (acute) exacerbation: Secondary | ICD-10-CM | POA: Diagnosis not present

## 2016-11-04 DIAGNOSIS — I10 Essential (primary) hypertension: Secondary | ICD-10-CM | POA: Diagnosis not present

## 2016-11-04 DIAGNOSIS — E43 Unspecified severe protein-calorie malnutrition: Secondary | ICD-10-CM | POA: Diagnosis not present

## 2016-11-04 DIAGNOSIS — J9601 Acute respiratory failure with hypoxia: Secondary | ICD-10-CM | POA: Diagnosis not present

## 2016-11-07 ENCOUNTER — Ambulatory Visit (INDEPENDENT_AMBULATORY_CARE_PROVIDER_SITE_OTHER)
Admission: RE | Admit: 2016-11-07 | Discharge: 2016-11-07 | Disposition: A | Discharge status: Home / Self Care | Payer: Medicare HMO | Source: Ambulatory Visit | Attending: Internal Medicine | Admitting: Internal Medicine

## 2016-11-07 ENCOUNTER — Encounter: Payer: Self-pay | Admitting: Internal Medicine

## 2016-11-07 ENCOUNTER — Ambulatory Visit (INDEPENDENT_AMBULATORY_CARE_PROVIDER_SITE_OTHER): Payer: Medicare HMO | Admitting: Internal Medicine

## 2016-11-07 ENCOUNTER — Other Ambulatory Visit (INDEPENDENT_AMBULATORY_CARE_PROVIDER_SITE_OTHER): Payer: Medicare HMO

## 2016-11-07 VITALS — BP 112/56 | HR 100 | Ht 70.0 in | Wt 144.4 lb

## 2016-11-07 DIAGNOSIS — J9611 Chronic respiratory failure with hypoxia: Secondary | ICD-10-CM | POA: Diagnosis not present

## 2016-11-07 DIAGNOSIS — J449 Chronic obstructive pulmonary disease, unspecified: Secondary | ICD-10-CM

## 2016-11-07 DIAGNOSIS — R918 Other nonspecific abnormal finding of lung field: Secondary | ICD-10-CM

## 2016-11-07 DIAGNOSIS — Z23 Encounter for immunization: Secondary | ICD-10-CM

## 2016-11-07 LAB — CBC WITH DIFFERENTIAL/PLATELET
BASOS ABS: 0.1 10*3/uL (ref 0.0–0.1)
BASOS PCT: 0.3 % (ref 0.0–3.0)
EOS ABS: 0.2 10*3/uL (ref 0.0–0.7)
Eosinophils Relative: 1.1 % (ref 0.0–5.0)
HCT: 35.5 % — ABNORMAL LOW (ref 39.0–52.0)
HEMOGLOBIN: 11 g/dL — AB (ref 13.0–17.0)
LYMPHS ABS: 4.6 10*3/uL — AB (ref 0.7–4.0)
Lymphocytes Relative: 26.1 % (ref 12.0–46.0)
MCHC: 30.9 g/dL (ref 30.0–36.0)
MCV: 76.3 fl — ABNORMAL LOW (ref 78.0–100.0)
MONO ABS: 1 10*3/uL (ref 0.1–1.0)
Monocytes Relative: 5.8 % (ref 3.0–12.0)
NEUTROS ABS: 11.7 10*3/uL — AB (ref 1.4–7.7)
NEUTROS PCT: 66.7 % (ref 43.0–77.0)
Platelets: 533 10*3/uL — ABNORMAL HIGH (ref 150.0–400.0)
RBC: 4.65 Mil/uL (ref 4.22–5.81)
RDW: 18.7 % — ABNORMAL HIGH (ref 11.5–15.5)
WBC: 17.5 10*3/uL — ABNORMAL HIGH (ref 4.0–10.5)

## 2016-11-07 LAB — SEDIMENTATION RATE: SED RATE: 130 mm/h — AB (ref 0–20)

## 2016-11-07 MED ORDER — BUDESONIDE-FORMOTEROL FUMARATE 160-4.5 MCG/ACT IN AERO: 2.0000 | INHALATION_SPRAY | Freq: Two times a day (BID) | RESPIRATORY_TRACT | 0 refills | Status: DC

## 2016-11-07 NOTE — Assessment & Plan Note (Signed)
11/07/2016  Walked RA x 3 laps @ 185 ft each stopped due to  End of study, nl pace, no sob or desat    - as of 11/07/2016 rec 02 2lpm hs and prn daytime

## 2016-11-07 NOTE — Progress Notes (Signed)
Subjective:     Patient ID: William Kirk, male   DOB: 07/12/1940,    MRN: [id number]  HPI  89 yowm quit smoking 04/2015 p episode of CAP in 2016 seen in UC in [place] > 100% better but < 6 months recurrent fever same UC  rx > 100% improved p zpak then added symbicort and still having episodes of fever attributed to pna  Then admitted     Admit date: 06/20/2016 Discharge date: 06/23/2016   Recommendations for Outpatient Follow-up:  1. f/u on weight loss 2. [facility]- extend course of antibiotics as needed- if he fails Levaquin, may need to consider IV antibiotics as he has improved with Zosyn and Vancomycin    Home Health:  none  Equipment/Devices:  none    Discharge Condition:  stable   CODE STATUS:  Full code   Consultations:  pulmonary    Discharge Diagnoses:  Principal Problem:   Multifocal pneumonia Active Problems:   Bronchiectasis (HCC)   COPD (chronic obstructive pulmonary disease) (HCC)   Acute respiratory failure with hypoxia (HCC)   Hypokalemia   Hyponatremia   Thrombocytosis (HCC)   PVD (peripheral vascular disease) (HCC)   Malnutrition of moderate degree    Subjective: Cough improving. No dyspnea at rest or with exertion. No chest pain, nausea, vomiting or diarrhea. Oral intake improving. Wants to go home.   Brief Summary: 76 year old male with a history of COPD and chronic bronchiectasis. He has been maintained on Zithromax 3 times a day for this past year due to persistent fevers and cough. The patient recently had a course of ciprofloxacin for 6 days due to recurrence of fevers and cough spite taking Zithromax. Subsequent to this course he developed fevers and chills and was placed on a six-day course of levofloxacin. About a week later he developed symptoms once again and this time was placed on ciprofloxacin which he took for 1 week. Fevers did not resolve and cough became worse and therefore he presented to the  hospital.  Hospital Course:  Principal Problem: Multifocal pneumonia/Acute respiratory failure with hypoxia  Sepsis -   b/l pneumonia- see CT chest below - he has had 3 course of Cipro alternating with Levaquin as outpt which were 6 days each with about a week gap in between each course-- suspect he had incomplete treatment and at this time with received a total of 14 days- -Previously has grown out Pseudomonas from his sputum which was sensitive to ciprofloxacin-however he has failed ciprofloxacin prior to presenting to the hospital -He is currently receiving vancomycin and Zosyn and has improved significantly- we have been able to wean him off of O2 (was on 4 L) and WBC count has improved from 19.9 to 11.9 - sputum culture consistent with normal flora -appreciate pulmonary's assistance-- he is anxious to be discharged and has been asking since yesterday- pulmonary is recommending a 10 day course of Levaquin and [facility]    Active Problems: COPD (chronic obstructive pulmonary disease)  -No exacerbation -Has quit smoking-does not use oxygen at baseline-follows with pulmonary as outpatient - have added nebulizer treatments to help with airway reactivity in setting of multifocal pneumonia  Hypokalemia Hyponatremia - adequately replaced  PVD (peripheral vascular disease) with history of PTCA and right lower extremity -Aspirin and Pletal  Malnutrition of moderate degree - has been losing weight and has had a poor appetite Continue supplements as outpt  Thrombocytosis (HCC) -  likely elevated as acute phase reactant in setting of infection- no prior blood work to compare with to see he baseline    Admit date: 09/21/2016 Discharge date: 09/26/2016  Admitted From: Home  Disposition: Home   Recommendations for Outpatient Follow-up:  3. Follow up with PCP in 1- week 4. Patient will follow up with Dr. Sherene Sires at the pulmonary clinic on  October 1st, he will need a non contrast chest CT, to evaluate resolution of the pulmonary infiltrates, and to evaluate the need of diagnostic bronchoscopy. 5. Patient placed on levofloxacin for the next 5 days 6. Home 02 screen ordered 7. Continue to use incentive spirometer and flutter valve     Diet recommendation:  Diet recommendations: Dysphagia 3 (mechanical soft);Nectar-thick liquid Liquids provided via: Cup;No straw Medication Administration: Whole meds with puree Supervision: Patient able to self feed;Full supervision/cueing for compensatory strategies Compensations: Slow rate;Small sips/bites;Chin tuck Postural Changes and/or Swallow Maneuvers: Seated upright 90 degrees  Brief/Interim Summary: 76 year old male presented with productive cough, dyspnea and weight loss. Patient is known to have COPD, tobacco abuse, arthritis, and depression. He developed dyspnea for last 2 days prior to hospitalization, associated with greenish productive sputum, intermittent fevers and generalized weakness. He has been worked up for swallowing dysfunction as an outpatient. On initial physical examination his oxygen saturation was found to be 80% on 6 L per nasal cannula, blood pressure 114/53, heart rate 85-93, respiratory rate 21-30. Moist mucous membranes, heart S1-S2 present rhythmic, no gallops, rubs murmurs, lungs with scattered rails, wheezing and rhonchi bilaterally, abdomen soft nontender, lower extremities no edema. Sodium 136, potassium 2.7, chloride 95, bicarbonate 30, glucose 135, BUN 26, creatinine 0.92, white cell count 9.3, hemoglobin 10.5, hematocrit 33.5, platelets 551. EKG normal sinus rhythm, rate 82 bpm, normal axis, normal intervals. His x-ray with new left upper lobe infiltrate, alveolar, loss lung volume right lower lobe. CT chest with no pulmonary embolism, collapse of the right lower lobe, with eft upper and lower lobe infiltrates.   Patient was admitted to the hospital working  diagnosis of acute hypoxic failure due to multifocal pneumonia  1. Multilobar aspiration pneumonia, predominantly left lower lobe, present on admission, complicated by acute hypoxic respiratory failure. Patient was admitted to the stepdown unit, placed on supplemental oxygen per high flow nasal cannula, antibiotic therapy with vancomycin and Zosyn intravenously. He was seen by speech therapy, findings positive for swallow dysfunction, diet  was changed to dysphagia 3 with good toleration. Patient improved to the medical therapy, the antibiotic therapy was descalated to Zosyn, and he will be discharged on levofloxacin for 5 more days. He had aggressive pulmonary toileting with chest PT, flutter valve and incentive spirometry. He will have a home oxygen screen before discharge. He was instructed about aspiration precautions.  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 Need to rule out underlying malignancy or mycobacterial infection. Case was discussed with Dr. Jamison Neighbor, with recommendations to follow-up as an outpatient in 4-6 weeks, then he will need a repeat chest CT, noncontrast, and evaluate for the need of diagnostic bronchoscopy. Patient does have a pulmonary specialist at the [place] but at this point is willing to follow-up with [facility], for post hospitalization follow up, appointment made for 11/07/2016.   2. COPD exacerbation. Patient was placed on bronchodilator therapy, oximetry monitoring and supplemental oxygen per nasal cannula. He received a short course of IV steroids during this hospitalization. Continue  tobramycin nebulizations. Continue tiotropium, as needed albuterol and Budesonide/formoterol  3. Severe calorie protein malnutrition. She was seen by dietary services, nutrition supplements were added.  4. Depression/anxiety. Patient continue trazodone and sertraline  with no major complications.   5. Hypertension. Blood pressure remained stable during his hospitalization, he will resume hydrochlorothiazide at discharge.  Discharge Diagnoses:  Principal Problem:   Acute respiratory failure with hypoxia (HCC) Active Problems:   Multifocal pneumonia   COPD with acute exacerbation (HCC)   Hypokalemia   Protein-calorie malnutrition, severe (HCC)   HCAP (healthcare-associated pneumonia)   Depression   Aspiration pneumonia (HCC)   HTN (hypertension)    11/07/2016 1st [facility] office visit/ Tommy Goostree   02 sitting/ sleeping but not with activity  Chief Complaint  Patient presents with  . Pulmonary Consult    Referred by Dr. Ella Jubilee. Pt states that he has had aspiration PNA off and on for the past year. He occ has cough and SOB but doing well today and denies any respiratory co's. He has an albuterol inhaler and neb that he rarely uses.    Doe = MMRC1 = can walk nl pace, flat grade, can't hurry or go uphills or steps s sob   Has f/u planned 11/24/16 with Dr Shelle Iron and feels he's swallowing fine now   No obvious patterns in  day to day or daytime variability or presently assoc excess/ purulent sputum or mucus plugs or hemoptysis or cp or chest tightness, subjective wheeze or overt sinus or hb symptoms. No unusual exp hx or h/o childhood pna/ asthma or knowledge of premature birth.  Sleeping ok flat without nocturnal  or early am exacerbation  of respiratory  c/o's or need for noct saba. Also denies any obvious fluctuation of symptoms with weather or environmental changes or other aggravating or alleviating factors except as outlined above   Current Allergies, Complete Past Medical History, Past Surgical History, Family History, and Social History were reviewed in [employer] record.  ROS  The following are not active complaints unless bolded Hoarseness, sore throat, dysphagia improved , dental problems, itching, sneezing,   nasal congestion or discharge of excess mucus or purulent secretions, ear ache,   fever, chills, sweats, unintended wt loss or wt gain, classically pleuritic or exertional cp,  orthopnea pnd or leg swelling, presyncope, palpitations, abdominal pain, anorexia, nausea, vomiting, diarrhea  or change in bowel habits or change in bladder habits, change in stools or change in urine, dysuria, hematuria,  rash, arthralgias, visual complaints, headache, numbness, weakness or ataxia or problems with walking or coordination,  change in mood/affect or memory.        Current Meds  Medication Sig  . acetaminophen (TYLENOL) 325 MG tablet Take 650 mg by mouth every 6 (six) hours as needed for mild pain or moderate pain.  Marland Kitchen albuterol (PROVENTIL HFA;VENTOLIN HFA) 108 (90 Base) MCG/ACT inhaler Inhale 1-2 puffs into the lungs every 6 (six) hours as needed for wheezing or shortness of breath.  Marland Kitchen albuterol (PROVENTIL) (2.5 MG/3ML) 0.083% nebulizer solution Take 3 mLs (2.5 mg total) by nebulization every 6 (six) hours as needed for wheezing or shortness of breath.  Marland Kitchen aspirin 81 MG tablet Take 81 mg by mouth daily.  . budesonide-formoterol (SYMBICORT) 160-4.5 MCG/ACT inhaler Inhale 2 puffs into the lungs 2 (two) times daily.  . cilostazol (PLETAL) 100 MG tablet Take 100 mg by mouth 2 (two) times daily.   . feeding supplement (BOOST / RESOURCE BREEZE) LIQD Take 1 Container  by mouth 2 (two) times daily between meals.  . gabapentin (NEURONTIN) 300 MG capsule Take 300 mg by mouth at bedtime.   . hydrochlorothiazide (HYDRODIURIL) 25 MG tablet Take 25 mg by mouth daily.  Marland Kitchen HYDROcodone-acetaminophen (NORCO) 10-325 MG tablet Take 1-2 tablets by mouth every 8 (eight) hours as needed.  . Multiple Vitamin (MULTIVITAMIN) tablet Take 1 tablet by mouth daily.  . OXYGEN 2 lpm 24/7  AHC  . protein supplement shake (PREMIER PROTEIN) LIQD Take 325 mLs (11 oz total) by mouth daily.  . sertraline (ZOLOFT) 100 MG tablet Take 200 mg by mouth  daily.   . Soft Lens Products (RENU MULTIPLUS) SOLN Place 2 drops into both eyes daily.  Marland Kitchen testosterone cypionate (DEPOTESTOTERONE CYPIONATE) 100 MG/ML injection Inject 200 mg into the muscle every 28 (twenty-eight) days.   . traZODone (DESYREL) 50 MG tablet Take 50 mg by mouth at bedtime.  . vitamin B-12 (CYANOCOBALAMIN) 1000 MCG tablet Take 1,000 mcg by mouth daily.  . [DISCONTINUED] tiotropium (SPIRIVA) 18 MCG inhalation capsule Place 18 mcg into inhaler and inhale 2 (two) times daily.         Review of Systems     Objective:   Physical Exam    amb wm nad   Wt Readings from Last 3 Encounters:  11/07/16 144 lb 6.4 oz (65.5 kg)  09/26/16 147 lb 3.2 oz (66.8 kg)  06/20/16 164 lb (74.4 kg)    Vital signs reviewed  - Note on arrival 02 sats  92% on  2lpm       HEENT: nl   turbinates bilaterally, and oropharynx. Nl external ear canals without cough reflex - edentulous with dentures in place    NECK :  without JVD/Nodes/TM/ nl carotid upstrokes bilaterally   LUNGS: no acc muscle use,  Nl contour chest with decreased bs R base/ dullness/ no cough on inps/ exp maneuvers   CV:  RRR  no s3 or murmur or increase in P2, and no edema   ABD:  soft and nontender with nl inspiratory excursion in the supine position. No bruits or organomegaly appreciated, bowel sounds nl  MS:  Nl gait/ ext warm without deformities, calf tenderness, cyanosis or clubbing No obvious joint restrictions   SKIN: warm and dry without lesions    NEURO:  alert, approp, nl sensorium with  no motor or cerebellar deficits apparent.     CXR PA and Lateral:   11/07/2016 :    I personally reviewed images and agree with radiology impression as follows:    Significant interval improvement in the previously seen bilateral patchy airspace process with only very minimal residual patchy opacification. Small amount right pleural fluid posteriorly unchanged. Improved partial right lower lobe  collapse.       Assessment:

## 2016-11-07 NOTE — Assessment & Plan Note (Addendum)
Quit smoking  04/2015 - Spirometry 11/07/2016  FEV1 2.04 (66%)  Ratio 61 with typical curvature p am spiriva/symb - 11/07/2016  After extensive coaching HFA effectiveness =    75% from a baseline of 50% > continue symb 160 and d/c spiriva mdi on a trial basis    DDX of  difficult airways management almost all start with A and  include Adherence, Ace Inhibitors, Acid Reflux, Active Sinus Disease, Alpha 1 Antitripsin deficiency, Anxiety masquerading as Airways dz,  ABPA,  Allergy(esp in young), Aspiration (esp in elderly), Adverse effects of meds,  Active smokers, A bunch of PE's (a small clot burden can't cause this syndrome unless there is already severe underlying pulm or vascular dz with poor reserve) plus two Bs  = Bronchiectasis and Beta blocker use..and one C= CHF   Adherence is always the initial "prime suspect" and is a multilayered concern that requires a "trust but verify" approach in every patient - starting with knowing how to use medications, especially inhalers, correctly, keeping up with refills and understanding the fundamental difference between maintenance and prns vs those medications only taken for a very short course and then stopped and not refilled.  - see hfa teaching  ? Aspiration >  CHL IP DIET RECOMMENDATION 09/22/2016  SLP Diet Recommendations Nectar thick liquid;Dysphagia 3 (Mech soft) solids  Liquid Administration via No straw;Cup  Medication Administration Whole meds with puree  Compensations Slow rate;Small sips/bites;Chin tuck  Postural Changes Remain semi-upright after after feeds/meals (Comment);Seated upright at 90 degrees    ? Adverse effects of dpi > try off spiriva/ probably doesn't need lama anyway at this point   ? Developing bronchiectasis from recurrent asp   ? chf > echo 09/26/16 - Normal LV systolic function; mild diastolic dysfunction; mild MR;   trace TR with mildly elevated pulmonary pressure.   Pt's hx is typical of COPD/AB but the recurrent  "pneumonia" syndrome is worrisome for mulitple reasons but the biggest concern at present is upper airway in nature and so trial off dpi is warranted and f/u Dr Gwenette Greet planned next   F/u in this clinic is prn    Total time devoted to counseling  > 50 % of initial 60 min office visit:  review case(extensive inpt records/images in EPIC) with pt/wife discussion of options/alternatives/ personally creating written customized instructions  in presence of pt  then going over those specific  Instructions directly with the pt including how to use all of the meds but in particular covering each new medication in detail and the difference between the maintenance= "automatic" meds and the prns using an action plan format for the latter (If this problem/symptom => do that organization reading Left to right).  Please see AVS from this visit for a full list of these instructions which I personally wrote for this pt and  are unique to this visit.

## 2016-11-07 NOTE — Patient Instructions (Addendum)
Stop spiriva for now  Plan A = Automatic = symbicort 160 Take 2 puffs first thing in am and then another 2 puffs about 12 hours later.    Work on inhaler technique:  relax and gently blow all the way out then take a nice smooth deep breath back in, triggering the inhaler at same time you start breathing in.  Hold for up to 5 seconds if you can. Blow out thru nose. Rinse and gargle with water when done      Plan B = Backup Only use your albuterol(PROAIR)  as a rescue medication to be used if you can't catch your breath by resting or doing a relaxed purse lip breathing pattern.  - The less you use it, the better it will work when you need it. - Ok to use the inhaler up to 2 puffs  every 4 hours if you must but call for appointment if use goes up over your usual need - Don't leave home without it !!  (think of it like the spare tire for your car)   Plan C = Crisis - only use your albuterol nebulizer if you first try Plan B and it fails to help > ok to use the nebulizer up to every 4 hours but if start needing it regularly call for immediate appointment   Please remember to go to the lab and x-ray department downstairs in the basement  for your tests - we will call you with the results when they are available.      Keep appts at [facility] and I will send Dr Gwenette Greet a copy of my recs

## 2016-11-08 NOTE — Assessment & Plan Note (Signed)
C/w recurrent aspiration, improving at present > f/u Dr Gwenette Greet

## 2016-11-08 NOTE — Progress Notes (Signed)
LMTCB

## 2016-11-08 NOTE — Progress Notes (Signed)
lmtcb

## 2016-11-11 DIAGNOSIS — I739 Peripheral vascular disease, unspecified: Secondary | ICD-10-CM | POA: Diagnosis not present

## 2016-11-11 DIAGNOSIS — J440 Chronic obstructive pulmonary disease with acute lower respiratory infection: Secondary | ICD-10-CM | POA: Diagnosis not present

## 2016-11-11 DIAGNOSIS — J9601 Acute respiratory failure with hypoxia: Secondary | ICD-10-CM | POA: Diagnosis not present

## 2016-11-11 DIAGNOSIS — I10 Essential (primary) hypertension: Secondary | ICD-10-CM | POA: Diagnosis not present

## 2016-11-11 DIAGNOSIS — F329 Major depressive disorder, single episode, unspecified: Secondary | ICD-10-CM | POA: Diagnosis not present

## 2016-11-11 DIAGNOSIS — Z9981 Dependence on supplemental oxygen: Secondary | ICD-10-CM | POA: Diagnosis not present

## 2016-11-11 DIAGNOSIS — J189 Pneumonia, unspecified organism: Secondary | ICD-10-CM | POA: Diagnosis not present

## 2016-11-11 DIAGNOSIS — J441 Chronic obstructive pulmonary disease with (acute) exacerbation: Secondary | ICD-10-CM | POA: Diagnosis not present

## 2016-11-11 DIAGNOSIS — E43 Unspecified severe protein-calorie malnutrition: Secondary | ICD-10-CM | POA: Diagnosis not present

## 2016-11-15 DIAGNOSIS — H524 Presbyopia: Secondary | ICD-10-CM | POA: Diagnosis not present

## 2016-11-17 ENCOUNTER — Telehealth: Payer: Self-pay | Admitting: Internal Medicine

## 2016-11-17 DIAGNOSIS — E43 Unspecified severe protein-calorie malnutrition: Secondary | ICD-10-CM | POA: Diagnosis not present

## 2016-11-17 DIAGNOSIS — F329 Major depressive disorder, single episode, unspecified: Secondary | ICD-10-CM | POA: Diagnosis not present

## 2016-11-17 DIAGNOSIS — I739 Peripheral vascular disease, unspecified: Secondary | ICD-10-CM | POA: Diagnosis not present

## 2016-11-17 DIAGNOSIS — J440 Chronic obstructive pulmonary disease with acute lower respiratory infection: Secondary | ICD-10-CM | POA: Diagnosis not present

## 2016-11-17 DIAGNOSIS — Z9981 Dependence on supplemental oxygen: Secondary | ICD-10-CM | POA: Diagnosis not present

## 2016-11-17 DIAGNOSIS — J9601 Acute respiratory failure with hypoxia: Secondary | ICD-10-CM | POA: Diagnosis not present

## 2016-11-17 DIAGNOSIS — J189 Pneumonia, unspecified organism: Secondary | ICD-10-CM | POA: Diagnosis not present

## 2016-11-17 DIAGNOSIS — I10 Essential (primary) hypertension: Secondary | ICD-10-CM | POA: Diagnosis not present

## 2016-11-17 DIAGNOSIS — J441 Chronic obstructive pulmonary disease with (acute) exacerbation: Secondary | ICD-10-CM | POA: Diagnosis not present

## 2016-11-17 NOTE — Telephone Encounter (Signed)
Spoke with Ebony Hail, aware of recs.  Will relay this to pt as she is at the pt's home currently.  Nothing further needed at this time.

## 2016-11-17 NOTE — Telephone Encounter (Signed)
Spoke with Ebony Hail, Electrical engineer- states that she is currently at pt's home for speech therapy.  States she listens to pt's lungs at every visit, and today noticed a squeaking in lungs b/l during inhalation, worse on L side.  Is concerned about aspiration.  States pt has baseline sob, cough, congestion, but nothing increased.  Pt also denies fever, chest pain.  Pt and Ebony Hail are wanting to know if any further evaluation is needed.    MW please advise.  Thanks.   10/1 AVS:  Patient Instructions    Stop spiriva for now  Plan A = Automatic = symbicort 160 Take 2 puffs first thing in am and then another 2 puffs about 12 hours later.    Work on inhaler technique:  relax and gently blow all the way out then take a nice smooth deep breath back in, triggering the inhaler at same time you start breathing in.  Hold for up to 5 seconds if you can. Blow out thru nose. Rinse and gargle with water when done      Plan B = Backup Only use your albuterol(PROAIR)  as a rescue medication to be used if you can't catch your breath by resting or doing a relaxed purse lip breathing pattern.  - The less you use it, the better it will work when you need it. - Ok to use the inhaler up to 2 puffs  every 4 hours if you must but call for appointment if use goes up over your usual need - Don't leave home without it !!  (think of it like the spare tire for your car)   Plan C = Crisis - only use your albuterol nebulizer if you first try Plan B and it fails to help > ok to use the nebulizer up to every 4 hours but if start needing it regularly call for immediate appointment   Please remember to go to the lab and x-ray department downstairs in the basement  for your tests - we will call you with the results when they are available.      Keep appts at [facility] and I will send Dr Gwenette Greet a copy of my recs

## 2016-11-17 NOTE — Telephone Encounter (Signed)
The only change I would recommend would be based on ST recs for diet  as no other medical options for treating aspiration (eg abx) in this setting

## 2016-11-24 ENCOUNTER — Ambulatory Visit (HOSPITAL_COMMUNITY)
Admission: RE | Admit: 2016-11-24 | Discharge: 2016-11-24 | Disposition: A | Discharge status: Home / Self Care | Payer: Medicare HMO | Source: Ambulatory Visit | Attending: Pulmonary Disease | Admitting: Pulmonary Disease

## 2016-11-24 DIAGNOSIS — I739 Peripheral vascular disease, unspecified: Secondary | ICD-10-CM | POA: Diagnosis not present

## 2016-11-24 DIAGNOSIS — J441 Chronic obstructive pulmonary disease with (acute) exacerbation: Secondary | ICD-10-CM | POA: Diagnosis not present

## 2016-11-24 DIAGNOSIS — R1319 Other dysphagia: Secondary | ICD-10-CM | POA: Insufficient documentation

## 2016-11-24 DIAGNOSIS — J189 Pneumonia, unspecified organism: Secondary | ICD-10-CM | POA: Diagnosis not present

## 2016-11-24 DIAGNOSIS — T17320A Food in larynx causing asphyxiation, initial encounter: Secondary | ICD-10-CM | POA: Diagnosis not present

## 2016-11-24 DIAGNOSIS — J690 Pneumonitis due to inhalation of food and vomit: Secondary | ICD-10-CM | POA: Diagnosis not present

## 2016-11-24 DIAGNOSIS — E43 Unspecified severe protein-calorie malnutrition: Secondary | ICD-10-CM | POA: Diagnosis not present

## 2016-11-24 DIAGNOSIS — F329 Major depressive disorder, single episode, unspecified: Secondary | ICD-10-CM | POA: Diagnosis not present

## 2016-11-24 DIAGNOSIS — I10 Essential (primary) hypertension: Secondary | ICD-10-CM | POA: Diagnosis not present

## 2016-11-24 DIAGNOSIS — Z9981 Dependence on supplemental oxygen: Secondary | ICD-10-CM | POA: Diagnosis not present

## 2016-11-24 DIAGNOSIS — J440 Chronic obstructive pulmonary disease with acute lower respiratory infection: Secondary | ICD-10-CM | POA: Diagnosis not present

## 2016-11-24 DIAGNOSIS — J9601 Acute respiratory failure with hypoxia: Secondary | ICD-10-CM | POA: Diagnosis not present

## 2016-11-26 DIAGNOSIS — J189 Pneumonia, unspecified organism: Secondary | ICD-10-CM | POA: Diagnosis not present

## 2016-11-26 DIAGNOSIS — J441 Chronic obstructive pulmonary disease with (acute) exacerbation: Secondary | ICD-10-CM | POA: Diagnosis not present

## 2016-11-26 DIAGNOSIS — J9601 Acute respiratory failure with hypoxia: Secondary | ICD-10-CM | POA: Diagnosis not present

## 2016-12-27 DIAGNOSIS — J189 Pneumonia, unspecified organism: Secondary | ICD-10-CM | POA: Diagnosis not present

## 2016-12-27 DIAGNOSIS — J9601 Acute respiratory failure with hypoxia: Secondary | ICD-10-CM | POA: Diagnosis not present

## 2016-12-27 DIAGNOSIS — J441 Chronic obstructive pulmonary disease with (acute) exacerbation: Secondary | ICD-10-CM | POA: Diagnosis not present

## 2017-01-26 DIAGNOSIS — J9601 Acute respiratory failure with hypoxia: Secondary | ICD-10-CM | POA: Diagnosis not present

## 2017-01-26 DIAGNOSIS — J441 Chronic obstructive pulmonary disease with (acute) exacerbation: Secondary | ICD-10-CM | POA: Diagnosis not present

## 2017-01-26 DIAGNOSIS — J189 Pneumonia, unspecified organism: Secondary | ICD-10-CM | POA: Diagnosis not present

## 2017-02-11 DIAGNOSIS — R781 Finding of opiate drug in blood: Secondary | ICD-10-CM | POA: Diagnosis not present

## 2017-02-26 DIAGNOSIS — J441 Chronic obstructive pulmonary disease with (acute) exacerbation: Secondary | ICD-10-CM | POA: Diagnosis not present

## 2017-02-26 DIAGNOSIS — J9601 Acute respiratory failure with hypoxia: Secondary | ICD-10-CM | POA: Diagnosis not present

## 2017-02-26 DIAGNOSIS — J189 Pneumonia, unspecified organism: Secondary | ICD-10-CM | POA: Diagnosis not present

## 2017-03-29 DIAGNOSIS — J441 Chronic obstructive pulmonary disease with (acute) exacerbation: Secondary | ICD-10-CM | POA: Diagnosis not present

## 2017-03-29 DIAGNOSIS — J189 Pneumonia, unspecified organism: Secondary | ICD-10-CM | POA: Diagnosis not present

## 2017-03-29 DIAGNOSIS — J9601 Acute respiratory failure with hypoxia: Secondary | ICD-10-CM | POA: Diagnosis not present

## 2017-04-26 DIAGNOSIS — J441 Chronic obstructive pulmonary disease with (acute) exacerbation: Secondary | ICD-10-CM | POA: Diagnosis not present

## 2017-04-26 DIAGNOSIS — J9601 Acute respiratory failure with hypoxia: Secondary | ICD-10-CM | POA: Diagnosis not present

## 2017-04-26 DIAGNOSIS — J189 Pneumonia, unspecified organism: Secondary | ICD-10-CM | POA: Diagnosis not present

## 2017-05-27 DIAGNOSIS — J9601 Acute respiratory failure with hypoxia: Secondary | ICD-10-CM | POA: Diagnosis not present

## 2017-05-27 DIAGNOSIS — J189 Pneumonia, unspecified organism: Secondary | ICD-10-CM | POA: Diagnosis not present

## 2017-05-27 DIAGNOSIS — J441 Chronic obstructive pulmonary disease with (acute) exacerbation: Secondary | ICD-10-CM | POA: Diagnosis not present

## 2017-06-26 DIAGNOSIS — J441 Chronic obstructive pulmonary disease with (acute) exacerbation: Secondary | ICD-10-CM | POA: Diagnosis not present

## 2017-06-26 DIAGNOSIS — J9601 Acute respiratory failure with hypoxia: Secondary | ICD-10-CM | POA: Diagnosis not present

## 2017-06-26 DIAGNOSIS — J189 Pneumonia, unspecified organism: Secondary | ICD-10-CM | POA: Diagnosis not present

## 2017-07-19 DIAGNOSIS — M25551 Pain in right hip: Secondary | ICD-10-CM | POA: Diagnosis not present

## 2017-07-19 DIAGNOSIS — J449 Chronic obstructive pulmonary disease, unspecified: Secondary | ICD-10-CM | POA: Diagnosis not present

## 2017-12-06 DIAGNOSIS — H524 Presbyopia: Secondary | ICD-10-CM | POA: Diagnosis not present

## 2018-07-07 ENCOUNTER — Emergency Department (HOSPITAL_COMMUNITY): Payer: No Typology Code available for payment source

## 2018-07-07 ENCOUNTER — Encounter (HOSPITAL_COMMUNITY): Payer: Self-pay | Admitting: Emergency Medicine

## 2018-07-07 ENCOUNTER — Inpatient Hospital Stay (HOSPITAL_COMMUNITY)
Admission: EM | Admit: 2018-07-07 | Discharge: 2018-07-11 | DRG: 208 | Disposition: A | Discharge status: Home / Self Care | Payer: No Typology Code available for payment source | Attending: Internal Medicine | Admitting: Internal Medicine

## 2018-07-07 ENCOUNTER — Other Ambulatory Visit: Payer: Self-pay

## 2018-07-07 DIAGNOSIS — F329 Major depressive disorder, single episode, unspecified: Secondary | ICD-10-CM | POA: Diagnosis present

## 2018-07-07 DIAGNOSIS — R079 Chest pain, unspecified: Secondary | ICD-10-CM | POA: Diagnosis not present

## 2018-07-07 DIAGNOSIS — Z96641 Presence of right artificial hip joint: Secondary | ICD-10-CM | POA: Diagnosis present

## 2018-07-07 DIAGNOSIS — R918 Other nonspecific abnormal finding of lung field: Secondary | ICD-10-CM | POA: Diagnosis present

## 2018-07-07 DIAGNOSIS — I739 Peripheral vascular disease, unspecified: Secondary | ICD-10-CM | POA: Diagnosis present

## 2018-07-07 DIAGNOSIS — J449 Chronic obstructive pulmonary disease, unspecified: Secondary | ICD-10-CM | POA: Diagnosis not present

## 2018-07-07 DIAGNOSIS — J189 Pneumonia, unspecified organism: Secondary | ICD-10-CM

## 2018-07-07 DIAGNOSIS — E43 Unspecified severe protein-calorie malnutrition: Secondary | ICD-10-CM | POA: Diagnosis present

## 2018-07-07 DIAGNOSIS — I10 Essential (primary) hypertension: Secondary | ICD-10-CM | POA: Diagnosis present

## 2018-07-07 DIAGNOSIS — R042 Hemoptysis: Secondary | ICD-10-CM | POA: Diagnosis present

## 2018-07-07 DIAGNOSIS — J9600 Acute respiratory failure, unspecified whether with hypoxia or hypercapnia: Secondary | ICD-10-CM | POA: Diagnosis present

## 2018-07-07 DIAGNOSIS — E876 Hypokalemia: Secondary | ICD-10-CM | POA: Diagnosis not present

## 2018-07-07 DIAGNOSIS — Z7982 Long term (current) use of aspirin: Secondary | ICD-10-CM | POA: Diagnosis not present

## 2018-07-07 DIAGNOSIS — R000 Tachycardia, unspecified: Secondary | ICD-10-CM | POA: Diagnosis not present

## 2018-07-07 DIAGNOSIS — G9341 Metabolic encephalopathy: Secondary | ICD-10-CM | POA: Diagnosis present

## 2018-07-07 DIAGNOSIS — N170 Acute kidney failure with tubular necrosis: Secondary | ICD-10-CM | POA: Diagnosis not present

## 2018-07-07 DIAGNOSIS — Z1159 Encounter for screening for other viral diseases: Secondary | ICD-10-CM

## 2018-07-07 DIAGNOSIS — Z79899 Other long term (current) drug therapy: Secondary | ICD-10-CM | POA: Diagnosis not present

## 2018-07-07 DIAGNOSIS — Z7902 Long term (current) use of antithrombotics/antiplatelets: Secondary | ICD-10-CM

## 2018-07-07 DIAGNOSIS — J9601 Acute respiratory failure with hypoxia: Secondary | ICD-10-CM | POA: Diagnosis present

## 2018-07-07 DIAGNOSIS — Z7951 Long term (current) use of inhaled steroids: Secondary | ICD-10-CM | POA: Diagnosis not present

## 2018-07-07 DIAGNOSIS — D638 Anemia in other chronic diseases classified elsewhere: Secondary | ICD-10-CM | POA: Diagnosis present

## 2018-07-07 DIAGNOSIS — Z87891 Personal history of nicotine dependence: Secondary | ICD-10-CM

## 2018-07-07 DIAGNOSIS — E872 Acidosis: Secondary | ICD-10-CM | POA: Diagnosis present

## 2018-07-07 DIAGNOSIS — J9621 Acute and chronic respiratory failure with hypoxia: Secondary | ICD-10-CM | POA: Diagnosis not present

## 2018-07-07 DIAGNOSIS — J479 Bronchiectasis, uncomplicated: Secondary | ICD-10-CM

## 2018-07-07 DIAGNOSIS — R739 Hyperglycemia, unspecified: Secondary | ICD-10-CM | POA: Diagnosis present

## 2018-07-07 DIAGNOSIS — R0602 Shortness of breath: Secondary | ICD-10-CM | POA: Diagnosis present

## 2018-07-07 DIAGNOSIS — F419 Anxiety disorder, unspecified: Secondary | ICD-10-CM | POA: Diagnosis present

## 2018-07-07 DIAGNOSIS — D75839 Thrombocytosis, unspecified: Secondary | ICD-10-CM | POA: Diagnosis present

## 2018-07-07 DIAGNOSIS — E785 Hyperlipidemia, unspecified: Secondary | ICD-10-CM | POA: Diagnosis present

## 2018-07-07 DIAGNOSIS — D473 Essential (hemorrhagic) thrombocythemia: Secondary | ICD-10-CM | POA: Diagnosis present

## 2018-07-07 DIAGNOSIS — J90 Pleural effusion, not elsewhere classified: Secondary | ICD-10-CM | POA: Diagnosis not present

## 2018-07-07 DIAGNOSIS — J441 Chronic obstructive pulmonary disease with (acute) exacerbation: Secondary | ICD-10-CM | POA: Diagnosis not present

## 2018-07-07 DIAGNOSIS — J471 Bronchiectasis with (acute) exacerbation: Principal | ICD-10-CM

## 2018-07-07 DIAGNOSIS — G8929 Other chronic pain: Secondary | ICD-10-CM | POA: Diagnosis present

## 2018-07-07 DIAGNOSIS — N179 Acute kidney failure, unspecified: Secondary | ICD-10-CM | POA: Diagnosis present

## 2018-07-07 DIAGNOSIS — Z9981 Dependence on supplemental oxygen: Secondary | ICD-10-CM

## 2018-07-07 LAB — POCT I-STAT 7, (LYTES, BLD GAS, ICA,H+H)
Acid-base deficit: 7 mmol/L — ABNORMAL HIGH (ref 0.0–2.0)
Bicarbonate: 21.3 mmol/L (ref 20.0–28.0)
Calcium, Ion: 1.19 mmol/L (ref 1.15–1.40)
HCT: 40 % (ref 39.0–52.0)
Hemoglobin: 13.6 g/dL (ref 13.0–17.0)
O2 Saturation: 100 %
Patient temperature: 97.9
Potassium: 4.6 mmol/L (ref 3.5–5.1)
Sodium: 139 mmol/L (ref 135–145)
TCO2: 23 mmol/L (ref 22–32)
pCO2 arterial: 51 mmHg — ABNORMAL HIGH (ref 32.0–48.0)
pH, Arterial: 7.227 — ABNORMAL LOW (ref 7.350–7.450)
pO2, Arterial: 226 mmHg — ABNORMAL HIGH (ref 83.0–108.0)

## 2018-07-07 LAB — URINALYSIS, ROUTINE W REFLEX MICROSCOPIC
Bilirubin Urine: NEGATIVE
Glucose, UA: NEGATIVE mg/dL
Hgb urine dipstick: NEGATIVE
Ketones, ur: NEGATIVE mg/dL
Leukocytes,Ua: NEGATIVE
Nitrite: NEGATIVE
Protein, ur: 100 mg/dL — AB
Specific Gravity, Urine: 1.018 (ref 1.005–1.030)
pH: 5 (ref 5.0–8.0)

## 2018-07-07 LAB — CBC WITH DIFFERENTIAL/PLATELET
Abs Immature Granulocytes: 0.12 10*3/uL — ABNORMAL HIGH (ref 0.00–0.07)
Basophils Absolute: 0.1 10*3/uL (ref 0.0–0.1)
Basophils Relative: 1 %
Eosinophils Absolute: 0.2 10*3/uL (ref 0.0–0.5)
Eosinophils Relative: 1 %
HCT: 34.6 % — ABNORMAL LOW (ref 39.0–52.0)
Hemoglobin: 10.3 g/dL — ABNORMAL LOW (ref 13.0–17.0)
Immature Granulocytes: 1 %
Lymphocytes Relative: 37 %
Lymphs Abs: 6.4 10*3/uL — ABNORMAL HIGH (ref 0.7–4.0)
MCH: 27.5 pg (ref 26.0–34.0)
MCHC: 29.8 g/dL — ABNORMAL LOW (ref 30.0–36.0)
MCV: 92.5 fL (ref 80.0–100.0)
Monocytes Absolute: 0.9 10*3/uL (ref 0.1–1.0)
Monocytes Relative: 5 %
Neutro Abs: 9.7 10*3/uL — ABNORMAL HIGH (ref 1.7–7.7)
Neutrophils Relative %: 55 %
Platelets: 530 10*3/uL — ABNORMAL HIGH (ref 150–400)
RBC: 3.74 MIL/uL — ABNORMAL LOW (ref 4.22–5.81)
RDW: 15.3 % (ref 11.5–15.5)
WBC: 17.4 10*3/uL — ABNORMAL HIGH (ref 4.0–10.5)
nRBC: 0 % (ref 0.0–0.2)

## 2018-07-07 LAB — CBC
HCT: 26.7 % — ABNORMAL LOW (ref 39.0–52.0)
Hemoglobin: 8.2 g/dL — ABNORMAL LOW (ref 13.0–17.0)
MCH: 27.2 pg (ref 26.0–34.0)
MCHC: 30.7 g/dL (ref 30.0–36.0)
MCV: 88.4 fL (ref 80.0–100.0)
Platelets: 332 10*3/uL (ref 150–400)
RBC: 3.02 MIL/uL — ABNORMAL LOW (ref 4.22–5.81)
RDW: 15.4 % (ref 11.5–15.5)
WBC: 9.6 10*3/uL (ref 4.0–10.5)
nRBC: 0 % (ref 0.0–0.2)

## 2018-07-07 LAB — GLUCOSE, CAPILLARY
Glucose-Capillary: 101 mg/dL — ABNORMAL HIGH (ref 70–99)
Glucose-Capillary: 123 mg/dL — ABNORMAL HIGH (ref 70–99)
Glucose-Capillary: 166 mg/dL — ABNORMAL HIGH (ref 70–99)

## 2018-07-07 LAB — COMPREHENSIVE METABOLIC PANEL
ALT: 17 U/L (ref 0–44)
AST: 26 U/L (ref 15–41)
Albumin: 2.8 g/dL — ABNORMAL LOW (ref 3.5–5.0)
Alkaline Phosphatase: 99 U/L (ref 38–126)
Anion gap: 14 (ref 5–15)
BUN: 19 mg/dL (ref 8–23)
CO2: 20 mmol/L — ABNORMAL LOW (ref 22–32)
Calcium: 9.2 mg/dL (ref 8.9–10.3)
Chloride: 101 mmol/L (ref 98–111)
Creatinine, Ser: 1.47 mg/dL — ABNORMAL HIGH (ref 0.61–1.24)
GFR calc Af Amer: 53 mL/min — ABNORMAL LOW (ref >60)
GFR calc non Af Amer: 46 mL/min — ABNORMAL LOW (ref >60)
Glucose, Bld: 221 mg/dL — ABNORMAL HIGH (ref 70–99)
Potassium: 4.7 mmol/L (ref 3.5–5.1)
Sodium: 135 mmol/L (ref 135–145)
Total Bilirubin: 0.3 mg/dL (ref 0.3–1.2)
Total Protein: 7.9 g/dL (ref 6.5–8.1)

## 2018-07-07 LAB — TYPE AND SCREEN
ABO/RH(D): O POS
Antibody Screen: NEGATIVE

## 2018-07-07 LAB — MRSA PCR SCREENING: MRSA by PCR: NEGATIVE

## 2018-07-07 LAB — BRAIN NATRIURETIC PEPTIDE: B Natriuretic Peptide: 113.2 pg/mL — ABNORMAL HIGH (ref 0.0–100.0)

## 2018-07-07 LAB — LACTIC ACID, PLASMA
Lactic Acid, Venous: 4.1 mmol/L (ref 0.5–1.9)
Lactic Acid, Venous: 1.2 mmol/L (ref 0.5–1.9)

## 2018-07-07 LAB — ABO/RH: ABO/RH(D): O POS

## 2018-07-07 LAB — SARS CORONAVIRUS 2 BY RT PCR (HOSPITAL ORDER, PERFORMED IN ~~LOC~~ HOSPITAL LAB): SARS Coronavirus 2: NEGATIVE

## 2018-07-07 LAB — PROCALCITONIN: Procalcitonin: 0.34 ng/mL

## 2018-07-07 LAB — TROPONIN I: Troponin I: <0.03 ng/mL (ref <0.03)

## 2018-07-07 LAB — STREP PNEUMONIAE URINARY ANTIGEN: Strep Pneumo Urinary Antigen: NEGATIVE

## 2018-07-07 MED ORDER — FENTANYL 2500MCG IN NS 250ML (10MCG/ML) PREMIX INFUSION
25.0000 ug/h | INTRAVENOUS | Status: DC
  Administered 2018-07-07: 50 ug/h via INTRAVENOUS
  Filled 2018-07-07: qty 250

## 2018-07-07 MED ORDER — IOHEXOL 350 MG/ML SOLN
100.0000 mL | Freq: Once | INTRAVENOUS | Status: AC | PRN
  Administered 2018-07-07: 15:00:00 100 mL via INTRAVENOUS

## 2018-07-07 MED ORDER — SODIUM CHLORIDE 0.9 % IV BOLUS (SEPSIS)
1000.0000 mL | Freq: Once | INTRAVENOUS | Status: AC
  Administered 2018-07-07: 14:00:00 1000 mL via INTRAVENOUS

## 2018-07-07 MED ORDER — DEXTROSE-NACL 5-0.45 % IV SOLN
INTRAVENOUS | Status: DC
  Administered 2018-07-07: 17:00:00 via INTRAVENOUS

## 2018-07-07 MED ORDER — SODIUM CHLORIDE 0.9 % IV SOLN
500.0000 mg | INTRAVENOUS | Status: DC
  Administered 2018-07-08: 14:00:00 500 mg via INTRAVENOUS
  Filled 2018-07-07 (×2): qty 500

## 2018-07-07 MED ORDER — ETOMIDATE 2 MG/ML IV SOLN
INTRAVENOUS | Status: AC | PRN
  Administered 2018-07-07: 20 mg via INTRAVENOUS

## 2018-07-07 MED ORDER — DOCUSATE SODIUM 50 MG/5ML PO LIQD: 100.0000 mg | Freq: Two times a day (BID) | ORAL | Status: DC | PRN

## 2018-07-07 MED ORDER — SODIUM CHLORIDE 0.9 % IV SOLN
INTRAVENOUS | Status: AC | PRN
  Administered 2018-07-07: 500 mL via INTRAVENOUS

## 2018-07-07 MED ORDER — PROPOFOL 1000 MG/100ML IV EMUL
INTRAVENOUS | Status: AC
  Filled 2018-07-07: qty 100

## 2018-07-07 MED ORDER — METHYLPREDNISOLONE SODIUM SUCC 125 MG IJ SOLR
125.0000 mg | Freq: Once | INTRAMUSCULAR | Status: AC
  Administered 2018-07-07: 14:00:00 125 mg via INTRAVENOUS
  Filled 2018-07-07: qty 2

## 2018-07-07 MED ORDER — SUCCINYLCHOLINE CHLORIDE 20 MG/ML IJ SOLN
INTRAMUSCULAR | Status: AC | PRN
  Administered 2018-07-07: 150 mg via INTRAVENOUS

## 2018-07-07 MED ORDER — MIDAZOLAM HCL 2 MG/2ML IJ SOLN
1.0000 mg | INTRAMUSCULAR | Status: DC | PRN
  Administered 2018-07-07: 16:00:00 2 mg via INTRAVENOUS
  Administered 2018-07-07: 23:00:00 1 mg via INTRAVENOUS
  Administered 2018-07-08 (×3): 2 mg via INTRAVENOUS
  Filled 2018-07-07 (×5): qty 2

## 2018-07-07 MED ORDER — IPRATROPIUM-ALBUTEROL 0.5-2.5 (3) MG/3ML IN SOLN
3.0000 mL | Freq: Four times a day (QID) | RESPIRATORY_TRACT | Status: DC
  Administered 2018-07-07 – 2018-07-08 (×5): 3 mL via RESPIRATORY_TRACT
  Filled 2018-07-07 (×5): qty 3

## 2018-07-07 MED ORDER — PANTOPRAZOLE SODIUM 40 MG IV SOLR
40.0000 mg | Freq: Every day | INTRAVENOUS | Status: DC
  Administered 2018-07-07 – 2018-07-08 (×2): 40 mg via INTRAVENOUS
  Filled 2018-07-07 (×2): qty 40

## 2018-07-07 MED ORDER — FENTANYL BOLUS VIA INFUSION
25.0000 ug | INTRAVENOUS | Status: DC | PRN
  Administered 2018-07-08 (×2): 25 ug via INTRAVENOUS
  Filled 2018-07-07: qty 25

## 2018-07-07 MED ORDER — SODIUM CHLORIDE 0.9 % IV SOLN
2.0000 g | Freq: Once | INTRAVENOUS | Status: AC
  Administered 2018-07-07: 17:00:00 2 g via INTRAVENOUS
  Filled 2018-07-07: qty 2

## 2018-07-07 MED ORDER — PROPOFOL 1000 MG/100ML IV EMUL
INTRAVENOUS | Status: AC | PRN
  Administered 2018-07-07: 10 ug/kg/min via INTRAVENOUS

## 2018-07-07 MED ORDER — VANCOMYCIN HCL IN DEXTROSE 750-5 MG/150ML-% IV SOLN: 750.0000 mg | INTRAVENOUS | Status: DC

## 2018-07-07 MED ORDER — IPRATROPIUM-ALBUTEROL 0.5-2.5 (3) MG/3ML IN SOLN
3.0000 mL | RESPIRATORY_TRACT | Status: DC
  Administered 2018-07-07: 16:00:00 3 mL via RESPIRATORY_TRACT
  Filled 2018-07-07: qty 3

## 2018-07-07 MED ORDER — INSULIN ASPART 100 UNIT/ML ~~LOC~~ SOLN
2.0000 [IU] | SUBCUTANEOUS | Status: DC
  Administered 2018-07-07: 2 [IU] via SUBCUTANEOUS
  Administered 2018-07-07: 20:00:00 4 [IU] via SUBCUTANEOUS

## 2018-07-07 MED ORDER — CHLORHEXIDINE GLUCONATE 0.12% ORAL RINSE (MEDLINE KIT)
15.0000 mL | Freq: Two times a day (BID) | OROMUCOSAL | Status: DC
  Administered 2018-07-07 – 2018-07-08 (×2): 15 mL via OROMUCOSAL

## 2018-07-07 MED ORDER — FENTANYL CITRATE (PF) 100 MCG/2ML IJ SOLN
25.0000 ug | Freq: Once | INTRAMUSCULAR | Status: AC
  Administered 2018-07-07: 25 ug via INTRAVENOUS

## 2018-07-07 MED ORDER — ORAL CARE MOUTH RINSE
15.0000 mL | OROMUCOSAL | Status: DC
  Administered 2018-07-07 – 2018-07-08 (×7): 15 mL via OROMUCOSAL

## 2018-07-07 MED ORDER — SODIUM CHLORIDE 0.9 % IV SOLN
500.0000 mg | Freq: Once | INTRAVENOUS | Status: AC
  Administered 2018-07-07: 14:00:00 500 mg via INTRAVENOUS
  Filled 2018-07-07: qty 500

## 2018-07-07 MED ORDER — SODIUM CHLORIDE 0.9 % IV SOLN
1.0000 g | Freq: Once | INTRAVENOUS | Status: AC
  Administered 2018-07-07: 14:00:00 1 g via INTRAVENOUS
  Filled 2018-07-07: qty 10

## 2018-07-07 MED ORDER — VANCOMYCIN HCL IN DEXTROSE 1-5 GM/200ML-% IV SOLN
1000.0000 mg | Freq: Once | INTRAVENOUS | Status: AC
  Administered 2018-07-07: 17:00:00 1000 mg via INTRAVENOUS
  Filled 2018-07-07: qty 200

## 2018-07-07 MED ORDER — MIDAZOLAM HCL 2 MG/2ML IJ SOLN
INTRAMUSCULAR | Status: AC
  Filled 2018-07-07: qty 6

## 2018-07-07 MED ORDER — SODIUM CHLORIDE 0.9 % IV SOLN
2.0000 g | Freq: Two times a day (BID) | INTRAVENOUS | Status: DC
  Administered 2018-07-08 – 2018-07-09 (×3): 2 g via INTRAVENOUS
  Filled 2018-07-07 (×4): qty 2

## 2018-07-07 MED ORDER — CHLORHEXIDINE GLUCONATE CLOTH 2 % EX PADS
6.0000 | MEDICATED_PAD | Freq: Every day | CUTANEOUS | Status: DC
  Administered 2018-07-08 – 2018-07-10 (×3): 6 via TOPICAL

## 2018-07-07 MED ORDER — PROPOFOL 1000 MG/100ML IV EMUL: 0.0000 ug/kg/min | INTRAVENOUS | Status: DC

## 2018-07-07 MED ORDER — MIDAZOLAM HCL 5 MG/5ML IJ SOLN
INTRAMUSCULAR | Status: AC | PRN
  Administered 2018-07-07: 5 mg via INTRAVENOUS

## 2018-07-07 NOTE — ED Notes (Signed)
ED TO INPATIENT HANDOFF REPORT  ED Nurse Name and Phone #: Nicki Reaper [place] Name/Age/Gender William Kirk 78 y.o. male Room/Bed: TRACC/TRACC  Code Status   Code Status: Prior  Home/SNF/Other Home Patient oriented to: self, place, time and situation Is this baseline? Yes   Triage Complete: Triage complete  Chief Complaint Coughing up blood  Triage Note Pt BIB [facility] from home for SOB, coughing up blood, and chest pain. EMS advised when they arrived on scene the pt was lethargic and cyanotic. EMS advised the pt had coughed up bright red blood several times for them. EMS advised the SOB had been going on for a couple of days now. EMS advised they started an 18g IV in the left FA, 12 lead EKG was unremarkable, and vital signs were stable.   Pt reports SOB for several days. Pt is extremely tachypenic, tachycardiac, and SOB. Pt reports the bleeding just started today which is what prompted him to call 9-1-1.    Allergies No Known Allergies  Level of Care/Admitting Diagnosis ED Disposition    ED Disposition Condition Comment   Admit  Hospital Area: [facility] [[id number]]  Level of Care: ICU [6]  Covid Evaluation: Confirmed COVID Negative  Diagnosis: Acute respiratory failure ([place]) [518.81.ICD-9-CM]  Admitting Physician: Collene Gobble [[id number]]  Attending Physician: Collene Gobble [[id number]]  Estimated length of stay: 5 - 7 days  Certification:: I certify this patient will need inpatient services for at least 2 midnights  PT Class (Do Not Modify): Inpatient [101]  PT Acc Code (Do Not Modify): Private [1]       B Medical/Surgery History Past Medical History:  Diagnosis Date  . Arthritis    "hands" (09/21/2016)  . Bleeding stomach ulcer 2000s  . COPD (chronic obstructive pulmonary disease) ([place])   . Depression   . Pneumonia    "now and several times before this" (09/21/2016)  . PVD (peripheral vascular disease) ([place]) 2014   prev PTCA on RLE   Past  Surgical History:  Procedure Laterality Date  . JOINT REPLACEMENT    . KNEE ARTHROSCOPY Right X 1  . TOTAL HIP ARTHROPLASTY Right 2009     A IV Location/Drains/Wounds Patient Lines/Drains/Airways Status   Active Line/Drains/Airways    Name:   Placement date:   Placement time:   Site:   Days:   Peripheral IV 07/07/18 Right Other (Comment)   07/07/18    1225    Other (Comment)   less than 1   Peripheral IV 07/07/18 Right Antecubital   07/07/18    1150    Antecubital   less than 1   Peripheral IV 07/07/18 Left;Posterior Forearm   07/07/18    -    Forearm   less than 1   NG/OG Tube Orogastric 16 Fr. Center mouth Xray;Aucultation Documented cm marking at nare/ corner of mouth   07/07/18    [place] mouth   less than 1   Urethral Catheter Rayford Halsted, RN Double-lumen 16 Fr.   07/07/18    1231    Double-lumen   less than 1   Airway 7.5 mm   07/07/18    1209     less than 1          Intake/Output Last 24 hours  Intake/Output Summary (Last 24 hours) at 07/07/2018 1512 Last data filed at 07/07/2018 1145 Gross per 24 hour  Intake 10 ml  Output -  Net 10 ml  Labs/Imaging Results for orders placed or performed during the hospital encounter of 07/07/18 (from the past 48 hour(s))  SARS Coronavirus 2 (CEPHEID- Performed in [facility] hospital lab), Hosp Order     Status: None   Collection Time: 07/07/18 11:49 AM  Result Value Ref Range   SARS Coronavirus 2 NEGATIVE NEGATIVE    Comment: (NOTE) If result is NEGATIVE SARS-CoV-2 target nucleic acids are NOT DETECTED. The SARS-CoV-2 RNA is generally detectable in upper and lower  respiratory specimens during the acute phase of infection. The lowest  concentration of SARS-CoV-2 viral copies this assay can detect is 250  copies / mL. A negative result does not preclude SARS-CoV-2 infection  and should not be used as the sole basis for treatment or other  patient management decisions.  A negative result may occur with  improper  specimen collection / handling, submission of specimen other  than nasopharyngeal swab, presence of viral mutation(s) within the  areas targeted by this assay, and inadequate number of viral copies  (<250 copies / mL). A negative result must be combined with clinical  observations, patient history, and epidemiological information. If result is POSITIVE SARS-CoV-2 target nucleic acids are DETECTED. The SARS-CoV-2 RNA is generally detectable in upper and lower  respiratory specimens dur ing the acute phase of infection.  Positive  results are indicative of active infection with SARS-CoV-2.  Clinical  correlation with patient history and other diagnostic information is  necessary to determine patient infection status.  Positive results do  not rule out bacterial infection or co-infection with other viruses. If result is PRESUMPTIVE POSTIVE SARS-CoV-2 nucleic acids MAY BE PRESENT.   A presumptive positive result was obtained on the submitted specimen  and confirmed on repeat testing.  While 2019 novel coronavirus  (SARS-CoV-2) nucleic acids may be present in the submitted sample  additional confirmatory testing may be necessary for epidemiological  and / or clinical management purposes  to differentiate between  SARS-CoV-2 and other Sarbecovirus currently known to infect humans.  If clinically indicated additional testing with an alternate test  methodology [id number]) is advised. The SARS-CoV-2 RNA is generally  detectable in upper and lower respiratory sp ecimens during the acute  phase of infection. The expected result is Negative. Fact Sheet for Patients:  StrictlyIdeas.no Fact Sheet for Healthcare Providers: BankingDealers.co.za This test is not yet approved or cleared by the [place] FDA and has been authorized for detection and/or diagnosis of SARS-CoV-2 by FDA under an Emergency Use Authorization (EUA).  This EUA will remain in  effect (meaning this test can be used) for the duration of the COVID-19 declaration under Section 564(b)(1) of the Act, 21 U.S.C. section 360bbb-3(b)(1), unless the authorization is terminated or revoked sooner. Performed at [facility], [place] [address], [place], [place] [address]   Type and screen [place]     Status: None   Collection Time: 07/07/18 11:50 AM  Result Value Ref Range   ABO/RH(D) O POS    Antibody Screen NEG    Sample Expiration      07/10/2018,2359 Performed at [facility], [place] [address] [place], [place] [address]   ABO/Rh     Status: None   Collection Time: 07/07/18 11:50 AM  Result Value Ref Range   ABO/RH(D)      O POS Performed at [facility] [address], [place], [place] [address]   Comprehensive metabolic panel     Status: Abnormal  Collection Time: 07/07/18 11:51 AM  Result Value Ref Range   Sodium 135 135 - 145 mmol/L   Potassium 4.7 3.5 - 5.1 mmol/L   Chloride 101 98 - 111 mmol/L   CO2 20 (L) 22 - 32 mmol/L   Glucose, Bld 221 (H) 70 - 99 mg/dL   BUN 19 8 - 23 mg/dL   Creatinine, Ser 1.47 (H) 0.61 - 1.24 mg/dL   Calcium 9.2 8.9 - 10.3 mg/dL   Total Protein 7.9 6.5 - 8.1 g/dL   Albumin 2.8 (L) 3.5 - 5.0 g/dL   AST 26 15 - 41 U/L   ALT 17 0 - 44 U/L   Alkaline Phosphatase 99 38 - 126 U/L   Total Bilirubin 0.3 0.3 - 1.2 mg/dL   GFR calc non Af Amer 46 (L) >60 mL/min   GFR calc Af Amer 53 (L) >60 mL/min   Anion gap 14 5 - 15    Comment: Performed at [facility] [address], [place], [place] [address]  Troponin I - Once     Status: None   Collection Time: 07/07/18 11:51 AM  Result Value Ref Range   Troponin I <0.03 <0.03 ng/mL    Comment: Performed at [facility] [address], [place], [place] [address]  Lactic acid, plasma     Status: Abnormal   Collection Time: 07/07/18 11:51 AM  Result Value Ref Range   Lactic Acid, Venous 4.1 (HH) 0.5 - 1.9 mmol/L    Comment: CRITICAL  RESULT CALLED TO, READ BACK BY AND VERIFIED WITH: E.ADKINS,RN @ 1249 07/07/2018 [place] Performed at [facility], [place] [address], [place], [place] [address]   CBC with Differential     Status: Abnormal   Collection Time: 07/07/18 11:51 AM  Result Value Ref Range   WBC 17.4 (H) 4.0 - 10.5 K/uL   RBC 3.74 (L) 4.22 - 5.81 MIL/uL   Hemoglobin 10.3 (L) 13.0 - 17.0 g/dL   HCT 34.6 (L) 39.0 - 52.0 %   MCV 92.5 80.0 - 100.0 fL   MCH 27.5 26.0 - 34.0 pg   MCHC 29.8 (L) 30.0 - 36.0 g/dL   RDW 15.3 11.5 - 15.5 %   Platelets 530 (H) 150 - 400 K/uL   nRBC 0.0 0.0 - 0.2 %   Neutrophils Relative % 55 %   Neutro Abs 9.7 (H) 1.7 - 7.7 K/uL   Lymphocytes Relative 37 %   Lymphs Abs 6.4 (H) 0.7 - 4.0 K/uL   Monocytes Relative 5 %   Monocytes Absolute 0.9 0.1 - 1.0 K/uL   Eosinophils Relative 1 %   Eosinophils Absolute 0.2 0.0 - 0.5 K/uL   Basophils Relative 1 %   Basophils Absolute 0.1 0.0 - 0.1 K/uL   Immature Granulocytes 1 %   Abs Immature Granulocytes 0.12 (H) 0.00 - 0.07 K/uL    Comment: Performed at [facility], [place] [address], [place], [place] [address]  Brain natriuretic peptide     Status: Abnormal   Collection Time: 07/07/18 11:51 AM  Result Value Ref Range   B Natriuretic Peptide 113.2 (H) 0.0 - 100.0 pg/mL    Comment: Performed at [facility] [address] [place], [place] [address]  Urinalysis, Routine w reflex microscopic     Status: Abnormal   Collection Time: 07/07/18  1:36 PM  Result Value Ref Range   Color, Urine YELLOW YELLOW   APPearance CLEAR CLEAR   Specific  Gravity, Urine 1.018 1.005 - 1.030   pH 5.0 5.0 - 8.0   Glucose, UA NEGATIVE NEGATIVE mg/dL   Hgb urine dipstick NEGATIVE NEGATIVE   Bilirubin Urine NEGATIVE NEGATIVE   Ketones, ur NEGATIVE NEGATIVE mg/dL   Protein, ur 100 (A) NEGATIVE mg/dL   Nitrite NEGATIVE NEGATIVE   Leukocytes,Ua NEGATIVE NEGATIVE   RBC / HPF 11-20 0 - 5 RBC/hpf   WBC, UA 0-5 0 - 5 WBC/hpf   Bacteria, UA RARE (A)  NONE SEEN   Squamous Epithelial / LPF 0-5 0 - 5   Mucus PRESENT    Hyaline Casts, UA PRESENT     Comment: Performed at [facility], [place] [address] [place], [place] [address]  Lactic acid, plasma     Status: None   Collection Time: 07/07/18  2:00 PM  Result Value Ref Range   Lactic Acid, Venous 1.2 0.5 - 1.9 mmol/L    Comment: Performed at [facility] [address] [place], [place] [address]   Ct Angio Chest Pe W/cm &/or Wo Cm  Result Date: 07/07/2018 CLINICAL DATA:  Chest pain EXAM: CT ANGIOGRAPHY CHEST WITH CONTRAST TECHNIQUE: Multidetector CT imaging of the chest was performed using the standard protocol during bolus administration of intravenous contrast. Multiplanar CT image reconstructions and MIPs were obtained to evaluate the vascular anatomy. CONTRAST:  155m OMNIPAQUE IOHEXOL 350 MG/ML SOLN COMPARISON:  09/21/2016 FINDINGS: Cardiovascular: Normal heart size. No pericardial effusion. Negative for pulmonary artery filling defect. There is shunting away from the collapsed right lower lobe. Aortic and coronary atherosclerosis. Mediastinum/Nodes: Endotracheal tube in good position with tip between the clavicular heads and carina. The orogastric tube tip is at the stomach. Mediastinal adenopathy. Subcarinal node measures 19 mm, similar to prior. Stable milder enlargement of bilateral hilar nodes. These nodes are likely reactive Lungs/Pleura: Continued collapse of the right lower lobe with debris or stricture of apical and posterior basilar segments. 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 Reticulonodular opacity throughout bilateral lungs is improved, with decreasing nodular areas. Upper Abdomen: No acute finding Musculoskeletal: No acute or aggressive finding.  Spondylosis. Review of the MIP images confirms the above findings. IMPRESSION: Negative for pulmonary embolism. 1. History of hemoptysis with cylindrical bronchiectasis and airway  narrowing in the right lower lobe has developed since 09/21/2016 chest CT. The affected segments are chronically collapsed. 2. 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 Electronically Signed   By: JMonte FantasiaM.D.   On: 07/07/2018 14:59   Dg Chest Port 1 View  Result Date: 07/07/2018 CLINICAL DATA:  Respiratory difficulty EXAM: PORTABLE CHEST 1 VIEW COMPARISON:  07/07/2018 1150 hours FINDINGS: Endotracheal tube placed with its tip 5.4 cm from the carina. NG tube placed with its tip beyond the gastroesophageal junction. Normal heart size. Heterogeneous opacities throughout both lungs are stable. There is volume loss in the right lung. No pneumothorax. Small right pleural effusion is suspected. IMPRESSION: Endotracheal and NG tubes placed as described. Small right pleural effusion is suspected. Opacities throughout both lungs are stable. Electronically Signed   By: AMarybelle KillingsM.D.   On: 07/07/2018 13:25   Dg Chest Port 1 View  Result Date: 07/07/2018 CLINICAL DATA:  Reason for exam: SOB  Hx of COPDsob EXAM: PORTABLE CHEST 1 VIEW COMPARISON:  Chest radiograph 11/07/2016 FINDINGS: Small RIGHT effusion and basilar opacity. Chronic interstitial lung disease. No pneumothorax. Hyperinflated lungs IMPRESSION: RIGHT pleural effusion and probable atelectasis or infiltrate.  Electronically Signed   By: Suzy Bouchard M.D.   On: 07/07/2018 12:17    Pending Labs Unresulted Labs (From admission, onward)    Start     Ordered   07/07/18 1459  Blood gas, arterial  Once,   R     07/07/18 1459   07/07/18 1148  Culture, blood (routine x 2)  BLOOD CULTURE X 2,   STAT     07/07/18 1147   Signed and Held  Procalcitonin  Once,   R     Signed and Held   Signed and Held  CBC  Now then every 6 hours,   R     Signed and Held   Signed and Held  Culture, respiratory (tracheal aspirate)  Once,   R     Signed and Held   Visual merchandiser and Held  Strep pneumoniae urinary  antigen  (not at [facility])  Once,   R     Signed and Held   Signed and Held  Legionella Pneumophila Serogp 1 Ur Ag  Once,   R     Signed and Held   Visual merchandiser and Held  CBC  Tomorrow morning,   R     Signed and Held   Visual merchandiser and Held  Basic metabolic panel  Tomorrow morning,   R     Signed and Held   Signed and Held  Magnesium  Tomorrow morning,   R     Signed and Held   Signed and Held  Triglycerides  (propofol (DIPRIVAN))  Daily,   R    Comments:  While on propofol (DIPRIVAN)    Signed and Held          Vitals/Pain Today's Vitals   07/07/18 1415 07/07/18 1430 07/07/18 1445 07/07/18 1500  BP: (!) 96/48  (!) 105/56 (!) 89/45  Pulse: 82 84 80 70  Resp: _0 Temp: (!) 96.2 F (35.7 C) (!) 96.3 F (35.7 C) (!) 96.3 F (35.7 C) (!) 96.1 F (35.6 C)  SpO2: 100% 100% 100% 100%  Weight:      Height:      PainSc:        Isolation Precautions Droplet and Contact precautions  Medications Medications  ipratropium-albuterol (DUONEB) 0.5-2.5 (3) MG/3ML nebulizer solution 3 mL (has no administration in time range)  vancomycin (VANCOCIN) IVPB 1000 mg/200 mL premix (has no administration in time range)  ceFEPIme (MAXIPIME) 2 g in sodium chloride 0.9 % 100 mL IVPB (has no administration in time range)  etomidate (AMIDATE) injection (20 mg Intravenous Given 07/07/18 1206)  succinylcholine (ANECTINE) injection (150 mg Intravenous Given 07/07/18 1207)  midazolam (VERSED) 5 MG/5ML injection (5 mg Intravenous Given 07/07/18 1206)  propofol (DIPRIVAN) 1000 MG/100ML infusion (35 mcg/kg/min  65.5 kg (Order-Specific) Intravenous Rate/Dose Change 07/07/18 1326)  0.9 %  sodium chloride infusion (500 mLs Intravenous New Bag/Given 07/07/18 1210)  methylPREDNISolone sodium succinate (SOLU-MEDROL) 125 mg/2 mL injection 125 mg (125 mg Intravenous Given 07/07/18 1334)  cefTRIAXone (ROCEPHIN) 1 g in sodium chloride 0.9 % 100 mL IVPB (0 g Intravenous Stopped 07/07/18 1408)  azithromycin (ZITHROMAX) 500 mg  in sodium chloride 0.9 % 250 mL IVPB (0 mg Intravenous Stopped 07/07/18 1456)  sodium chloride 0.9 % bolus 1,000 mL (0 mLs Intravenous Stopped 07/07/18 1408)    And  sodium chloride 0.9 % bolus 1,000 mL (0 mLs Intravenous Stopped 07/07/18 1408)  iohexol (OMNIPAQUE) 350 MG/ML injection 100 mL (100 mLs Intravenous Contrast Given 07/07/18 1439)  Mobility walks Low fall risk   Focused Assessments Pulmonary Assessment Handoff:  Lung sounds: Bilateral Breath Sounds: Rhonchi L Breath Sounds: Diminished R Breath Sounds: Clear          R Recommendations: See Admitting Provider Note  Report given to:   Additional Notes:

## 2018-07-07 NOTE — H&P (Signed)
PULMONARY / CRITICAL CARE MEDICINE   NAME:  William Kirk, MRN:  [id number], DOB:  [birth year], LOS: 0 ADMISSION DATE:  07/07/2018 REFERRING MD: 07/07/2018, CHIEF COMPLAINT: Acute dyspnea, hemoptysis  BRIEF HISTORY:    78 year old male with a history of COPD, bronchiectasis, brought to the ED to evaluate progressive dyspnea, chest pain and hemoptysis.  Intubated in the emergency department 5/30.   HISTORY OF PRESENT ILLNESS   History obtained from the ED notes and staff, wife. 53 year old man with a history of COPD (FEV1 2.0, 66% predicted 11/07/2016), prior pneumonias (ICU admission 09/2016) with nodular infiltrates by CT chest, reported subsequent bronchiectasis.  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 Followed by Dr Shelle Iron at [facility].  Has been treated with abx as an outpt over the last several weeks for btx flare, most recently Augmentin. Began to have hemoptysis 4-5 days PTA.  5/30 he developed progressive dyspnea, chest pain and more hemoptysis, prompting EMS. In ED he was intubated due to high WOB and evolving resp failure. Received empiric abx for possible CAP. CT chest pending.    SIGNIFICANT PAST MEDICAL HISTORY    has a past medical history of Arthritis, Bleeding stomach ulcer (2000s), COPD (chronic obstructive pulmonary disease) ([facility]), Depression, Pneumonia, and PVD (peripheral vascular disease) ([facility]) (2014).   SIGNIFICANT EVENTS:    STUDIES:   CT-PA 5/30 >>    CULTURES:  Blood 5/30 >>  Resp 5/30 >>  UA 5/30 >> negative   ANTIBIOTICS:  Vancomycin 5/30 >>  Cefepime 5/30 >>  Azithromycin 5/30 >>   LINES/TUBES:  ETT 5/30 >>   CONSULTANTS:   SUBJECTIVE:  Pt nods that he is having chest discomfort Is redirectable even on sedation  CONSTITUTIONAL: BP (!) 93/43   Pulse 83   Temp (!) 96.4 F (35.8 C)   Resp 20   Ht 5\' 10"  (1.778 m)   Wt 65.5 kg   SpO2 100%   BMI 20.72 kg/m   No  intake/output data recorded.     Vent Mode: PRVC FiO2 (%):  [100 %] 100 % Set Rate:  [15 bmp] 15 bmp Vt Set:  [580 mL] 580 mL PEEP:  [5 cmH20] 5 cmH20 Plateau Pressure:  [10 cmH20] 10 cmH20  PHYSICAL EXAM: General: thin ill appearing man, ventilated  Neuro:  Wakes to voice, nods to questions, follows commands. On propofol 35 HEENT:  ETT in place with bloody secretions  Cardiovascular:  Regular, tachy, no M, no edema Lungs:  Distant, no wheezes, no crackles.  Abdomen:  Soft, non-distended, + BS Musculoskeletal:  No deformity Skin:  No rash  RESOLVED PROBLEM LIST   ASSESSMENT AND PLAN    Acute respiratory failure, VDRF, suspected probable CAP + bronchiectasis exacerbation.  Need to rule out pulmonary embolism or mass as possible cause decompensation, hemoptysis.  Note COVID negative COPD without apparent exacerbation Plan: PRVC 8cc/kg, wean FiO2 as able Defer corticosteroids for now Scheduled BD's ordered Ct chest as below Obtain cultures Change abx to cover pseudomonas  Hemoptysis.  Suspect due to pneumonia/bronchiectasis flare, reportedly on Pletal, aspirin.  Given the nodular characteristics of his infiltrates on most recent CT scan 09/21/2016 must also consider malignancy. Plan: Agree with Ct-PA to evaluate parenchyma, for possible PE Hold ASA and any anticoagulation.  Depending on duration and CT results, consider FOB to localize source Check coags now Follow serial CBC Type and screen  Acute renal failure, non-oliguric (S Cr 0.67, 09/2016) AG-metabolic  acidosis. Due to renal failure and lactate (resolved) Plan: Follow BMP, UOP Ensure adequate renal perfusion Renal-dose meds  Lactic acidosis, resolved Plan: Continue volume resuscitation  Hx PVD Plan: Unclear whether he remains on Pletal > plan to hold this, ASA, any anticoag  Protein calorie malnutrition Plan: Initiate TF's Watch for evidence re-feeding syndrome  Hyperglycemia Plan: SSI per  protocol  Encephalopathy, need for sedation Hx depression Plan: Propofol + fentanyl gtt ? Still on Zoloft at home > will need to confirm   Best Practice / Goals of Care / Disposition.   DVT PROPHYLAXIS:SCD SUP:PPI NUTRITION:TF MOBILITY:BR GOALS OF CARE:Full  FAMILY DISCUSSIONS: will speak with wife 5/30 DISPOSITION ICU  LABS  Glucose No results for input(s): GLUCAP in the last 168 hours.  BMET Recent Labs  Lab 07/07/18 1151  NA 135  K 4.7  CL 101  CO2 20*  BUN 19  CREATININE 1.47*  GLUCOSE 221*    Liver Enzymes Recent Labs  Lab 07/07/18 1151  AST 26  ALT 17  ALKPHOS 99  BILITOT 0.3  ALBUMIN 2.8*    Electrolytes Recent Labs  Lab 07/07/18 1151  CALCIUM 9.2    CBC Recent Labs  Lab 07/07/18 1151  WBC 17.4*  HGB 10.3*  HCT 34.6*  PLT 530*    ABG No results for input(s): PHART, PCO2ART, PO2ART in the last 168 hours.  Coag's No results for input(s): APTT, INR in the last 168 hours.  Sepsis Markers Recent Labs  Lab 07/07/18 1151 07/07/18 1400  LATICACIDVEN 4.1* 1.2    Cardiac Enzymes Recent Labs  Lab 07/07/18 1151  TROPONINI <0.03    PAST MEDICAL HISTORY :   He  has a past medical history of Arthritis, Bleeding stomach ulcer (2000s), COPD (chronic obstructive pulmonary disease) ([facility]), Depression, Pneumonia, and PVD (peripheral vascular disease) ([facility]) (2014).  PAST SURGICAL HISTORY:  He  has a past surgical history that includes Knee arthroscopy (Right, X 1); Joint replacement; and Total hip arthroplasty (Right, 2009).  No Known Allergies  No current facility-administered medications on file prior to encounter.    Current Outpatient Medications on File Prior to Encounter  Medication Sig  . acetaminophen (TYLENOL) 325 MG tablet Take 650 mg by mouth every 6 (six) hours as needed for mild pain or moderate pain.  Marland Kitchen albuterol (PROVENTIL HFA;VENTOLIN HFA) 108 (90 Base) MCG/ACT inhaler Inhale 1-2 puffs into the lungs every 6 (six) hours  as needed for wheezing or shortness of breath.  Marland Kitchen albuterol (PROVENTIL) (2.5 MG/3ML) 0.083% nebulizer solution Take 3 mLs (2.5 mg total) by nebulization every 6 (six) hours as needed for wheezing or shortness of breath.  Marland Kitchen aspirin 81 MG tablet Take 81 mg by mouth daily.  . budesonide-formoterol (SYMBICORT) 160-4.5 MCG/ACT inhaler Inhale 2 puffs into the lungs 2 (two) times daily.  . cilostazol (PLETAL) 100 MG tablet Take 100 mg by mouth 2 (two) times daily.   . feeding supplement (BOOST / RESOURCE BREEZE) LIQD Take 1 Container by mouth 2 (two) times daily between meals.  . gabapentin (NEURONTIN) 300 MG capsule Take 300 mg by mouth at bedtime.   . hydrochlorothiazide (HYDRODIURIL) 25 MG tablet Take 25 mg by mouth daily.  Marland Kitchen HYDROcodone-acetaminophen (NORCO) 10-325 MG tablet Take 1-2 tablets by mouth every 8 (eight) hours as needed.  . Multiple Vitamin (MULTIVITAMIN) tablet Take 1 tablet by mouth daily.  . OXYGEN 2 lpm 24/7  AHC  . protein supplement shake (PREMIER PROTEIN) LIQD Take 325 mLs (11 oz total) by  mouth daily.  . sertraline (ZOLOFT) 100 MG tablet Take 200 mg by mouth daily.   . Soft Lens Products (RENU MULTIPLUS) SOLN Place 2 drops into both eyes daily.  Marland Kitchen testosterone cypionate (DEPOTESTOTERONE CYPIONATE) 100 MG/ML injection Inject 200 mg into the muscle every 28 (twenty-eight) days.   . traZODone (DESYREL) 50 MG tablet Take 50 mg by mouth at bedtime.  . vitamin B-12 (CYANOCOBALAMIN) 1000 MCG tablet Take 1,000 mcg by mouth daily.    FAMILY HISTORY:   His family history includes Cancer (age of onset: 45) in his father; Dementia in his mother.  SOCIAL HISTORY:  He  reports that he quit smoking about 3 years ago. His smoking use included cigarettes. He has a 55.00 pack-year smoking history. He has never used smokeless tobacco. He reports that he does not drink alcohol or use drugs.  REVIEW OF SYSTEMS:    Unable to obtain, pt intubated  Independent CC time: 60 minutes  Levy Pupa, MD, PhD 07/07/2018, 2:47 PM [facility] [phone] or if no answer [phone]

## 2018-07-07 NOTE — ED Notes (Signed)
Attempted report and was asked to call back because all nursing staff are busy in 1 room.

## 2018-07-07 NOTE — Progress Notes (Addendum)
ABG values: pH 7.22, pCO2 52, pO2 228, HCO3 21.3 Obtained on PRVC 580/15/60%/P-5  Patient's FIO2 was decreased to 40%

## 2018-07-07 NOTE — Progress Notes (Addendum)
Pharmacy Antibiotic Note  William Kirk is a 78 y.o. male admitted on 07/07/2018 with pneumonia.  Pharmacy has been consulted for vancomycin and cefepime dosing. Pt is afebrile but WBC is elevated at 17.4. SCr and lactic acid are also elevated.   Plan: Vancomycin 1gm IV x 1 then 750mg  IV Q24H Cefepime 2gm IV Q12H F/u renal fxn, C&S, clinical status and peak/trough at SS  Height: 5\' 10"  (177.8 cm) Weight: 144 lb 6.4 oz (65.5 kg) IBW/kg (Calculated) : 73  Temp (24hrs), Avg:95.9 F (35.5 C), Min:93.7 F (34.3 C), Max:96.5 F (35.8 C)  Recent Labs  Lab 07/07/18 1151 07/07/18 1400  WBC 17.4*  --   CREATININE 1.47*  --   LATICACIDVEN 4.1* 1.2    Estimated Creatinine Clearance: 39.6 mL/min (A) (by C-G formula based on SCr of 1.47 mg/dL (H)).    No Known Allergies  Antimicrobials this admission: Vanc 5/30>> Cefepime 5/30>> Azithro 5/30>> CTX x 1 5/30  Dose adjustments this admission: N/A  Microbiology results: Pending  Thank you for allowing pharmacy to be a part of this patient's care.  Allene Furuya, Rande Lawman 07/07/2018 3:08 PM

## 2018-07-07 NOTE — ED Triage Notes (Signed)
Pt BIB [facility] from home for SOB, coughing up blood, and chest pain. EMS advised when they arrived on scene the pt was lethargic and cyanotic. EMS advised the pt had coughed up bright red blood several times for them. EMS advised the SOB had been going on for a couple of days now. EMS advised they started an 18g IV in the left FA, 12 lead EKG was unremarkable, and vital signs were stable.   Pt reports SOB for several days. Pt is extremely tachypenic, tachycardiac, and SOB. Pt reports the bleeding just started today which is what prompted him to call 9-1-1.

## 2018-07-07 NOTE — ED Notes (Addendum)
Contacted pt's wife and advised her of his condition. Pt's wife was aware he was sick. Wife stated the pt is being seen by a [place] pulmonologist and just had a CT scan done on Friday which she stated she was negative for concerns. Wife advised the pt has been battling a lung infection for approximately a month now. Wife advised the pt was started on Cipro and was recently on 875 of Augmentin which he is still taking. Wife advised the pt started to cough up blood on Wednesday and the [place] pulmonologist was only concerned if there was more then 12.5 ml of blood brought up. Due to the increased blood this morning they decided to call 9-1-1 and be evaluated. Wife would only like to be updated when he gets a room. Wife is William Kirk # is [phone].

## 2018-07-07 NOTE — ED Provider Notes (Signed)
[facility] EMERGENCY DEPARTMENT Provider Note   CSN: [id number] Arrival date & time: 07/07/18  1145    History   Chief Complaint Chief Complaint  Patient presents with  . Respiratory Distress  . Hemoptysis  . GI Bleeding    HPI William Kirk is a 78 y.o. male.     HPI Is brought from home in significant distress.  On EMS arrival patient was lethargic and cyanotic.  Initial O2 sat readings in the 70s.  He has had some improvement on nonrebreather mask during transport.  No other medications administered on route.  Reportedly patient has had a pneumonia diagnosis.  He has been coughing up some blood.  She endorses chest pain but cannot localize it. Past Medical History:  Diagnosis Date  . Arthritis    "hands" (09/21/2016)  . Bleeding stomach ulcer 2000s  . COPD (chronic obstructive pulmonary disease) ([place])   . Depression   . Pneumonia    "now and several times before this" (09/21/2016)  . PVD (peripheral vascular disease) ([place]) 2014   prev PTCA on RLE    Patient Active Problem List   Diagnosis Date Noted  . Hemoptysis 07/07/2018  . Acute renal failure (ARF) ([place]) 07/07/2018  . Acute respiratory failure ([place]) 07/07/2018  . COPD GOLD II  11/07/2016  . Chronic respiratory failure with hypoxia ([place]) 11/07/2016  . Pulmonary infiltrates 11/07/2016  . Swallowing dysfunction 09/26/2016  . Depression 09/21/2016  . Aspiration pneumonia ([place]) 09/21/2016  . HTN (hypertension) 09/21/2016  . HCAP (healthcare-associated pneumonia)   . COPD with acute exacerbation ([place]) 06/21/2016  . Acute respiratory failure with hypoxia ([place]) 06/21/2016  . Hypokalemia 06/21/2016  . Hyponatremia 06/21/2016  . Thrombocytosis ([place]) 06/21/2016  . PVD (peripheral vascular disease) ([place]) 06/21/2016  . Bronchiectasis ([place]) 06/21/2016  . Protein-calorie malnutrition, severe ([place]) 06/21/2016  . Multifocal pneumonia 06/20/2016  . FUO (fever of unknown origin) 04/15/2015  .  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 Other tear of medial meniscus, current injury, unspecified knee, initial encounter 12/03/2012    Past Surgical History:  Procedure Laterality Date  . JOINT REPLACEMENT    . KNEE ARTHROSCOPY Right X 1  . TOTAL HIP ARTHROPLASTY Right 2009        Home Medications    Prior to Admission medications   Medication Sig Start Date End Date Taking? Authorizing Provider  acetaminophen (TYLENOL) 325 MG tablet Take 650 mg by mouth every 6 (six) hours as needed for mild pain or moderate pain.    [provider]  albuterol (PROVENTIL HFA;VENTOLIN HFA) 108 (90 Base) MCG/ACT inhaler Inhale 1-2 puffs into the lungs every 6 (six) hours as needed for wheezing or shortness of breath. 06/23/16   Debbe Odea, MD  albuterol (PROVENTIL) (2.5 MG/3ML) 0.083% nebulizer solution Take 3 mLs (2.5 mg total) by nebulization every 6 (six) hours as needed for wheezing or shortness of breath. 09/26/16 11/07/16  Arrien, Jimmy Picket, MD  aspirin 81 MG tablet Take 81 mg by mouth daily.    [provider]  budesonide-formoterol (SYMBICORT) 160-4.5 MCG/ACT inhaler Inhale 2 puffs into the lungs 2 (two) times daily.    [provider]  cilostazol (PLETAL) 100 MG tablet Take 100 mg by mouth 2 (two) times daily.     [provider]  feeding supplement (BOOST / RESOURCE BREEZE) LIQD Take 1 Container by mouth 2 (two) times daily between meals. 06/23/16  Debbe Odea, MD  gabapentin (NEURONTIN) 300 MG capsule Take 300 mg by mouth at bedtime.     [provider]  hydrochlorothiazide (HYDRODIURIL) 25 MG tablet Take 25 mg by mouth daily.    [provider]  HYDROcodone-acetaminophen (NORCO) 10-325 MG tablet Take 1-2 tablets by mouth every 8 (eight) hours as needed.    [provider]  Multiple Vitamin (MULTIVITAMIN) tablet Take 1 tablet by  mouth daily.    [provider]  OXYGEN 2 lpm 24/7  [facility]    [provider]  protein supplement shake (PREMIER PROTEIN) LIQD Take 325 mLs (11 oz total) by mouth daily. 06/23/16   Debbe Odea, MD  sertraline (ZOLOFT) 100 MG tablet Take 200 mg by mouth daily.     [provider]  Soft Lens Products (RENU MULTIPLUS) SOLN Place 2 drops into both eyes daily.    [provider]  testosterone cypionate (DEPOTESTOTERONE CYPIONATE) 100 MG/ML injection Inject 200 mg into the muscle every 28 (twenty-eight) days.     [provider]  traZODone (DESYREL) 50 MG tablet Take 50 mg by mouth at bedtime.    [provider]  vitamin B-12 (CYANOCOBALAMIN) 1000 MCG tablet Take 1,000 mcg by mouth daily.    [provider]    Family History Family History  Problem Relation Age of Onset  . Cancer Father 83       Brain  . Dementia Mother     Social History Social History   Tobacco Use  . Smoking status: Former Smoker    Packs/day: 1.00    Years: 55.00    Pack years: 55.00    Types: Cigarettes    Last attempt to quit: 04/2015    Years since quitting: 3.2  . Smokeless tobacco: Never Used  Substance Use Topics  . Alcohol use: No    Alcohol/week: 0.0 standard drinks  . Drug use: No     Allergies   Patient has no known allergies.   Review of Systems Review of Systems Level 5 caveat cannot obtain review of systems due to patient condition.  Physical Exam Updated Vital Signs BP (!) 89/45   Pulse 70   Temp (!) 96.1 F (35.6 C)   Resp 16   Ht _0  (1.778 m)   Wt 65.5 kg   SpO2 100%   BMI 20.72 kg/m   Physical Exam Constitutional:      Comments: Patient is awake and alert sitting in upright position.  Severe respiratory distress.  HENT:     Head: Normocephalic and atraumatic.     Nose:     Comments: Small amount of red crusted blood around the nares.    Mouth/Throat:     Comments: Patient has upper and lower dentures.   Mucous membranes are moist. Eyes:     Extraocular Movements: Extraocular movements intact.  Cardiovascular:     Comments: Tachycardic.  Regular.  Distant heart sounds. Pulmonary:     Comments: Severe increased work of breathing.  Breath sounds are diffusely diminished with some expiratory wheeze scattered. Abdominal:     General: There is no distension.     Palpations: Abdomen is soft.     Tenderness: There is no abdominal tenderness. There is no guarding.  Musculoskeletal:     Right lower leg: No edema.     Left lower leg: No edema.     Comments: No peripheral edema.  Feet are warm and dry.   Neurological:  Comments: Patient is extremely anxious and somewhat confused.  Cannot really answer questions very well.  No focal motor deficit.      ED Treatments / Results  Labs (all labs ordered are listed, but only abnormal results are displayed) Labs Reviewed  COMPREHENSIVE METABOLIC PANEL - Abnormal; Notable for the following components:      Result Value   CO2 20 (*)    Glucose, Bld 221 (*)    Creatinine, Ser 1.47 (*)    Albumin 2.8 (*)    GFR calc non Af Amer 46 (*)    GFR calc Af Amer 53 (*)    All other components within normal limits  LACTIC ACID, PLASMA - Abnormal; Notable for the following components:   Lactic Acid, Venous 4.1 (*)    All other components within normal limits  CBC WITH DIFFERENTIAL/PLATELET - Abnormal; Notable for the following components:   WBC 17.4 (*)    RBC 3.74 (*)    Hemoglobin 10.3 (*)    HCT 34.6 (*)    MCHC 29.8 (*)    Platelets 530 (*)    Neutro Abs 9.7 (*)    Lymphs Abs 6.4 (*)    Abs Immature Granulocytes 0.12 (*)    All other components within normal limits  URINALYSIS, ROUTINE W REFLEX MICROSCOPIC - Abnormal; Notable for the following components:   Protein, ur 100 (*)    Bacteria, UA RARE (*)    All other components within normal limits  BRAIN NATRIURETIC PEPTIDE - Abnormal; Notable for the following components:   B Natriuretic  Peptide 113.2 (*)    All other components within normal limits  SARS CORONAVIRUS 2 (HOSPITAL ORDER, [place] [facility])  CULTURE, BLOOD (ROUTINE X 2)  CULTURE, BLOOD (ROUTINE X 2)  TROPONIN I  LACTIC ACID, PLASMA  BLOOD GAS, ARTERIAL  TYPE AND SCREEN  ABO/RH    EKG EKG Interpretation  Date/Time:  Saturday Jul 07 2018 11:51:22 EDT Ventricular Rate:  116 PR Interval:    QRS Duration: 106 QT Interval:  314 QTC Calculation: 437 R Axis:   82 Text Interpretation:  Sinus tachycardia Atrial premature complex Probable left atrial enlargement Borderline right axis deviation Probable anteroseptal infarct, old Artifact in lead(s) I II III aVR aVL aVF and baseline wander in lead(s) I II III aVL aVF agree. no STEMI no sig change from old Confirmed by Charlesetta Shanks [phone]) on 07/07/2018 3:28:05 PM   Radiology Ct Angio Chest Pe W/cm &/or Wo Cm  Result Date: 07/07/2018 CLINICAL DATA:  Chest pain EXAM: CT ANGIOGRAPHY CHEST WITH CONTRAST TECHNIQUE: Multidetector CT imaging of the chest was performed using the standard protocol during bolus administration of intravenous contrast. Multiplanar CT image reconstructions and MIPs were obtained to evaluate the vascular anatomy. CONTRAST:  135m OMNIPAQUE IOHEXOL 350 MG/ML SOLN COMPARISON:  09/21/2016 FINDINGS: Cardiovascular: Normal heart size. No pericardial effusion. Negative for pulmonary artery filling defect. There is shunting away from the collapsed right lower lobe. Aortic and coronary atherosclerosis. Mediastinum/Nodes: Endotracheal tube in good position with tip between the clavicular heads and carina. The orogastric tube tip is at the stomach. Mediastinal adenopathy. Subcarinal node measures 19 mm, similar to prior. Stable milder enlargement of bilateral hilar nodes. These nodes are likely reactive Lungs/Pleura: Continued collapse of the right lower lobe with debris or stricture of apical and posterior basilar segments. Cylindrical  bronchiectasis has developed in the collapsed area since prior. Reticulonodular opacity throughout bilateral lungs is improved, with decreasing nodular areas. Upper  Abdomen: No acute finding Musculoskeletal: No acute or aggressive finding.  Spondylosis. Review of the MIP images confirms the above findings. IMPRESSION: Negative for pulmonary embolism. 1. History of hemoptysis with cylindrical bronchiectasis and airway narrowing in the right lower lobe has developed since 09/21/2016 chest CT. The affected segments are chronically collapsed. 2. 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 Electronically Signed   By: Monte Fantasia M.D.   On: 07/07/2018 14:59   Dg Chest Port 1 View  Result Date: 07/07/2018 CLINICAL DATA:  Respiratory difficulty EXAM: PORTABLE CHEST 1 VIEW COMPARISON:  07/07/2018 1150 hours FINDINGS: Endotracheal tube placed with its tip 5.4 cm from the carina. NG tube placed with its tip beyond the gastroesophageal junction. Normal heart size. Heterogeneous opacities throughout both lungs are stable. There is volume loss in the right lung. No pneumothorax. Small right pleural effusion is suspected. IMPRESSION: Endotracheal and NG tubes placed as described. Small right pleural effusion is suspected. Opacities throughout both lungs are stable. Electronically Signed   By: Marybelle Killings M.D.   On: 07/07/2018 13:25   Dg Chest Port 1 View  Result Date: 07/07/2018 CLINICAL DATA:  Reason for exam: SOB  Hx of COPDsob EXAM: PORTABLE CHEST 1 VIEW COMPARISON:  Chest radiograph 11/07/2016 FINDINGS: Small RIGHT effusion and basilar opacity. Chronic interstitial lung disease. No pneumothorax. Hyperinflated lungs IMPRESSION: RIGHT pleural effusion and probable atelectasis or infiltrate. Electronically Signed   By: Suzy Bouchard M.D.   On: 07/07/2018 12:17    Procedures Procedure Name: Intubation Date/Time: 07/07/2018 3:28 PM Performed by: Charlesetta Shanks, MD Pre-anesthesia Checklist: Patient identified Oxygen Delivery Method: Ambu bag Preoxygenation: Pre-oxygenation with 100% oxygen Induction Type: Rapid sequence Ventilation: Mask ventilation without difficulty Laryngoscope Size: Glidescope and 3 Grade View: Grade I Tube size: 7.5 mm Number of attempts: 1 Placement Confirmation: ETT inserted through vocal cords under direct vision,  CO2 detector and Breath sounds checked- equal and bilateral Secured at: 22 cm Tube secured with: ETT holder Comments: Uncomplicated intubation.      (including critical care time) CRITICAL CARE Performed by: Charlesetta Shanks   Total critical care time: 30 minutes  Critical care time was exclusive of separately billable procedures and treating other patients.  Critical care was necessary to treat or prevent imminent or life-threatening deterioration.  Critical care was time spent personally by me on the following activities: development of treatment plan with patient and/or surrogate as well as nursing, discussions with consultants, evaluation of patient's response to treatment, examination of patient, obtaining history from patient or surrogate, ordering and performing treatments and interventions, ordering and review of laboratory studies, ordering and review of radiographic studies, pulse oximetry and re-evaluation of patient's condition. Medications Ordered in ED Medications  ipratropium-albuterol (DUONEB) 0.5-2.5 (3) MG/3ML nebulizer solution 3 mL (has no administration in time range)  vancomycin (VANCOCIN) IVPB 1000 mg/200 mL premix (has no administration in time range)  ceFEPIme (MAXIPIME) 2 g in sodium chloride 0.9 % 100 mL IVPB (has no administration in time range)  etomidate (AMIDATE) injection (20 mg Intravenous Given 07/07/18 1206)  succinylcholine (ANECTINE) injection (150 mg Intravenous Given 07/07/18 1207)  midazolam (VERSED) 5 MG/5ML injection (5 mg Intravenous Given 07/07/18 1206)   propofol (DIPRIVAN) 1000 MG/100ML infusion (35 mcg/kg/min  65.5 kg (Order-Specific) Intravenous Rate/Dose Change 07/07/18 1326)  0.9 %  sodium chloride infusion (500 mLs Intravenous New Bag/Given 07/07/18 1210)  methylPREDNISolone sodium succinate (SOLU-MEDROL) 125 mg/2 mL injection 125 mg (125 mg Intravenous Given 07/07/18 1334)  cefTRIAXone (ROCEPHIN) 1 g in sodium chloride 0.9 % 100 mL IVPB (0 g Intravenous Stopped 07/07/18 1408)  azithromycin (ZITHROMAX) 500 mg in sodium chloride 0.9 % 250 mL IVPB (0 mg Intravenous Stopped 07/07/18 1456)  sodium chloride 0.9 % bolus 1,000 mL (0 mLs Intravenous Stopped 07/07/18 1408)    And  sodium chloride 0.9 % bolus 1,000 mL (0 mLs Intravenous Stopped 07/07/18 1408)  iohexol (OMNIPAQUE) 350 MG/ML injection 100 mL (100 mLs Intravenous Contrast Given 07/07/18 1439)     Initial Impression / Assessment and Plan / ED Course  I have reviewed the triage vital signs and the nursing notes.  Pertinent labs & imaging results that were available during my care of the patient were reviewed by me and considered in my medical decision making (see chart for details).       Patient presents in severe distress.  There is report of prior diagnosis of pneumonia.  Patient also has significant history of COPD by review of EMR.  This x-ray does not show significant focal consolidations but indicates an area of small effusion and atelectasis versus infiltrate.  Patient will be started with a sepsis protocol and antibiotics for community-acquired pneumonia.  Patient may have significant element of COPD exacerbation as well.  Hemoptysis is reported and there was some blood in the pulmonary expectorant.  Patient will be getting CT PE study as well.  Admit to intensivist service.  Final Clinical Impressions(s) / ED Diagnoses   Final diagnoses:  Acute hypoxemic respiratory failure ([place])  Community acquired pneumonia of right lower lobe of lung ([place])  Chronic obstructive pulmonary  disease, unspecified COPD type [facility])    ED Discharge Orders    None       Charlesetta Shanks, MD 07/07/18 [phone]

## 2018-07-08 ENCOUNTER — Inpatient Hospital Stay (HOSPITAL_COMMUNITY): Payer: No Typology Code available for payment source

## 2018-07-08 LAB — BASIC METABOLIC PANEL
Anion gap: 9 (ref 5–15)
BUN: 16 mg/dL (ref 8–23)
CO2: 19 mmol/L — ABNORMAL LOW (ref 22–32)
Calcium: 8.2 mg/dL — ABNORMAL LOW (ref 8.9–10.3)
Chloride: 109 mmol/L (ref 98–111)
Creatinine, Ser: 1.04 mg/dL (ref 0.61–1.24)
GFR calc Af Amer: >60 mL/min (ref >60)
GFR calc non Af Amer: >60 mL/min (ref >60)
Glucose, Bld: 128 mg/dL — ABNORMAL HIGH (ref 70–99)
Potassium: 4.3 mmol/L (ref 3.5–5.1)
Sodium: 137 mmol/L (ref 135–145)

## 2018-07-08 LAB — TRIGLYCERIDES: Triglycerides: 51 mg/dL (ref <150)

## 2018-07-08 LAB — CBC
HCT: 26.8 % — ABNORMAL LOW (ref 39.0–52.0)
HCT: 28.2 % — ABNORMAL LOW (ref 39.0–52.0)
Hemoglobin: 8.1 g/dL — ABNORMAL LOW (ref 13.0–17.0)
Hemoglobin: 8.5 g/dL — ABNORMAL LOW (ref 13.0–17.0)
MCH: 27.5 pg (ref 26.0–34.0)
MCH: 27 pg (ref 26.0–34.0)
MCHC: 30.2 g/dL (ref 30.0–36.0)
MCHC: 30.1 g/dL (ref 30.0–36.0)
MCV: 90.8 fL (ref 80.0–100.0)
MCV: 89.5 fL (ref 80.0–100.0)
Platelets: 339 10*3/uL (ref 150–400)
Platelets: 346 10*3/uL (ref 150–400)
RBC: 2.95 MIL/uL — ABNORMAL LOW (ref 4.22–5.81)
RBC: 3.15 MIL/uL — ABNORMAL LOW (ref 4.22–5.81)
RDW: 15.2 % (ref 11.5–15.5)
RDW: 15.3 % (ref 11.5–15.5)
WBC: 14.7 10*3/uL — ABNORMAL HIGH (ref 4.0–10.5)
WBC: 7.6 10*3/uL (ref 4.0–10.5)
nRBC: 0 % (ref 0.0–0.2)
nRBC: 0 % (ref 0.0–0.2)

## 2018-07-08 LAB — GLUCOSE, CAPILLARY
Glucose-Capillary: 106 mg/dL — ABNORMAL HIGH (ref 70–99)
Glucose-Capillary: 119 mg/dL — ABNORMAL HIGH (ref 70–99)
Glucose-Capillary: 83 mg/dL (ref 70–99)
Glucose-Capillary: 84 mg/dL (ref 70–99)
Glucose-Capillary: 93 mg/dL (ref 70–99)
Glucose-Capillary: 95 mg/dL (ref 70–99)

## 2018-07-08 LAB — MAGNESIUM: Magnesium: 1.8 mg/dL (ref 1.7–2.4)

## 2018-07-08 MED ORDER — ORAL CARE MOUTH RINSE
15.0000 mL | Freq: Two times a day (BID) | OROMUCOSAL | Status: DC
  Administered 2018-07-08 – 2018-07-10 (×4): 15 mL via OROMUCOSAL

## 2018-07-08 MED ORDER — IPRATROPIUM-ALBUTEROL 0.5-2.5 (3) MG/3ML IN SOLN
3.0000 mL | Freq: Three times a day (TID) | RESPIRATORY_TRACT | Status: DC
  Administered 2018-07-09 – 2018-07-11 (×7): 3 mL via RESPIRATORY_TRACT
  Filled 2018-07-08 (×7): qty 3

## 2018-07-08 NOTE — Progress Notes (Signed)
PULMONARY / CRITICAL CARE MEDICINE   NAME:  William Kirk, MRN:  [id number], DOB:  Dec 10, 1940, LOS: 1 ADMISSION DATE:  07/07/2018 REFERRING MD: 07/07/2018, CHIEF COMPLAINT: Acute dyspnea, hemoptysis  BRIEF HISTORY:    78 year old male with a history of COPD, bronchiectasis, brought to the ED to evaluate progressive dyspnea, chest pain and hemoptysis.  Intubated in the emergency department 5/30.   HISTORY OF PRESENT ILLNESS   History obtained from the ED notes and staff, wife. 3 year old man with a history of COPD (FEV1 2.0, 66% predicted 11/07/2016), prior pneumonias (ICU admission 09/2016) with nodular infiltrates by CT chest, reported subsequent bronchiectasis.  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 Followed by Dr William Kirk at [facility].  Has been treated with abx as an outpt over the last several weeks for btx flare, most recently Augmentin. Began to have hemoptysis 4-5 days PTA.  5/30 he developed progressive dyspnea, chest pain and more hemoptysis, prompting EMS. In ED he was intubated due to high WOB and evolving resp failure. Received empiric abx for possible CAP. CT chest pending.   SIGNIFICANT PAST MEDICAL HISTORY    has a past medical history of Arthritis, Bleeding stomach ulcer (2000s), COPD (chronic obstructive pulmonary disease) (HCC), Depression, Pneumonia, and PVD (peripheral vascular disease) (HCC) (2014).   SIGNIFICANT EVENTS:  SBT initiated on 5/31  STUDIES:   CT-PA 5/30 >> no pulmonary embolism. Cylindrical bronchiectasis.  CXR 5/30-31 >> evolving RLL infiltrate  CULTURES:  Blood 5/30 >> NGTD. Resp 5/30 >> +++ WBC, rare GPC UA 5/30 >> negative  SARS CoV-2 >> negative. MRSA >> negative ANTIBIOTICS:  Vancomycin 5/30 >> 5/31 Cefepime 5/30 >>  Azithromycin 5/30 >>   LINES/TUBES:  ETT 5/30 >>   CONSULTANTS:  PCCM  SUBJECTIVE:  Placed on SBT this morning.  CONSTITUTIONAL: BP (!) 145/60    Pulse 85   Temp 97.7 F (36.5 C) (Axillary)   Resp (!) 25   Ht 5\' 10"  (1.778 m)   Wt 74.2 kg   SpO2 96%   BMI 23.47 kg/m   I/O last 3 completed shifts: In: 1205.7 [I.V.:763.7; NG/GT:50; IV Piggyback:392] Out: 780 [Urine:780]     Vent Mode: CPAP;PSV FiO2 (%):  [40 %-100 %] 40 % Set Rate:  [15 bmp] 15 bmp Vt Set:  [580 mL] 580 mL PEEP:  [5 cmH20] 5 cmH20 Pressure Support:  [5 cmH20] 5 cmH20 Plateau Pressure:  [10 cmH20-18 cmH20] 16 cmH20  PHYSICAL EXAM: General: thin ill appearing man, ventilated  Neuro:  Wakes to voice, nods to questions, follows commands. Normal strength. HEENT:  ETT in place with bloody secretions  Cardiovascular:  Regular, tachy, no M, no edema Lungs:  Distant, no wheezes, no crackles. Excellent Vt on SBT. Abdomen:  Soft, non-distended, + BS Musculoskeletal:  No deformity Skin:  No rash  RESOLVED PROBLEM LIST   ASSESSMENT AND PLAN    Acute respiratory failure, VDRF, suspected probable CAP + bronchiectasis exacerbation. Tolerating SBT Plan: SBT and possible extubation today. Defer corticosteroids for now Scheduled BD's ordered  Hemoptysis.  Suspect due to pneumonia/bronchiectasis flare, reportedly on Pletal, aspirin.  Given the nodular characteristics of his infiltrates on most recent CT scan 09/21/2016 must also consider malignancy. Plan: Hold ASA and any anticoagulation.   Acute renal failure, non-oliguric (S Cr 0.67, 09/2016) AG-metabolic acidosis. Due to renal failure and lactate (resolved) Plan: Follow BMP, UOP Ensure adequate renal perfusion Renal-dose meds  Hx PVD Plan: Unclear whether he  remains on Pletal > plan to hold this, ASA, any anticoag  Protein calorie malnutrition Plan: Initiate TF's Watch for evidence re-feeding syndrome  Hyperglycemia Plan: SSI per protocol  Encephalopathy, need for sedation Hx depression Plan:  fentanyl gtt and Versed prn. ? Still on Zoloft at home > will need to confirm   Best Practice /  Goals of Care / Disposition.   DVT PROPHYLAXIS:SCD SUP:PPI NUTRITION:TF MOBILITY:BR GOALS OF CARE:Full  FAMILY DISCUSSIONS: will update today. DISPOSITION ICU  LABS  Glucose Recent Labs  Lab 07/07/18 1613 07/07/18 1929 07/07/18 2340 07/08/18 0346 07/08/18 0703  GLUCAP 101* 166* 123* 119* 106*    BMET Recent Labs  Lab 07/07/18 1151 07/07/18 1622 07/08/18 0359  NA 135 139 137  K 4.7 4.6 4.3  CL 101  --  109  CO2 20*  --  19*  BUN 19  --  16  CREATININE 1.47*  --  1.04  GLUCOSE 221*  --  128*    Liver Enzymes Recent Labs  Lab 07/07/18 1151  AST 26  ALT 17  ALKPHOS 99  BILITOT 0.3  ALBUMIN 2.8*    Electrolytes Recent Labs  Lab 07/07/18 1151 07/08/18 0359  CALCIUM 9.2 8.2*  MG  --  1.8    CBC Recent Labs  Lab 07/07/18 1639 07/07/18 2222 07/08/18 0359  WBC 14.7* 9.6 7.6  HGB 8.1* 8.2* 8.5*  HCT 26.8* 26.7* 28.2*  PLT 339 332 346    ABG Recent Labs  Lab 07/07/18 1622  PHART 7.227*  PCO2ART 51.0*  PO2ART 226.0*    Coag's No results for input(s): APTT, INR in the last 168 hours.  Sepsis Markers Recent Labs  Lab 07/07/18 1151 07/07/18 1400 07/07/18 1639  LATICACIDVEN 4.1* 1.2  --   PROCALCITON  --   --  0.34    Cardiac Enzymes Recent Labs  Lab 07/07/18 1151  TROPONINI <0.03   CRITICAL CARE Performed by: William Kirk   Total critical care time: 40 minutes  Critical care time was exclusive of separately billable procedures and treating other patients.  Critical care was necessary to treat or prevent imminent or life-threatening deterioration.  Critical care was time spent personally by me on the following activities: development of treatment plan with patient and/or surrogate as well as nursing, discussions with consultants, evaluation of patient's response to treatment, examination of patient, obtaining history from patient or surrogate, ordering and performing treatments and interventions, ordering and review of  laboratory studies, ordering and review of radiographic studies, pulse oximetry, re-evaluation of patient's condition and participation in multidisciplinary rounds.  William Catalan, MD [facility] ICU Physician [facility] [place] [facility]  Pager: [phone] Mobile: [phone] After hours: [phone].

## 2018-07-08 NOTE — Progress Notes (Signed)
Assisted tele visit to patient with sister. Lenox Ahr, RN

## 2018-07-08 NOTE — Procedures (Signed)
Extubation Procedure Note  Patient Details:   Name: William Kirk DOB: [birth year] MRN: [id number]   Airway Documentation:    Vent end date: 07/08/18 Vent end time: 0922   Evaluation  O2 sats: stable throughout Complications: No apparent complications Patient did tolerate procedure well. Bilateral Breath Sounds: Clear, Diminished   Yes: patient was able to breathe around the ETT prior to extubation and speak post extubation. No stridor noted at this time. Patient placed on a 3lpm N/C with o2 saturation at 97%. Will continue to monitor.   Acquanetta Belling 07/08/2018, 9:24 AM

## 2018-07-08 NOTE — Progress Notes (Signed)
Assisted tele visit to patient with wife.  Terriann Difonzo M, RN  

## 2018-07-09 LAB — CBC WITH DIFFERENTIAL/PLATELET
Abs Immature Granulocytes: 0.03 10*3/uL (ref 0.00–0.07)
Basophils Absolute: 0 10*3/uL (ref 0.0–0.1)
Basophils Relative: 0 %
Eosinophils Absolute: 0 10*3/uL (ref 0.0–0.5)
Eosinophils Relative: 0 %
HCT: 27.3 % — ABNORMAL LOW (ref 39.0–52.0)
Hemoglobin: 8.5 g/dL — ABNORMAL LOW (ref 13.0–17.0)
Immature Granulocytes: 0 %
Lymphocytes Relative: 13 %
Lymphs Abs: 1.4 10*3/uL (ref 0.7–4.0)
MCH: 27.4 pg (ref 26.0–34.0)
MCHC: 31.1 g/dL (ref 30.0–36.0)
MCV: 88.1 fL (ref 80.0–100.0)
Monocytes Absolute: 0.8 10*3/uL (ref 0.1–1.0)
Monocytes Relative: 8 %
Neutro Abs: 8.2 10*3/uL — ABNORMAL HIGH (ref 1.7–7.7)
Neutrophils Relative %: 79 %
Platelets: 350 10*3/uL (ref 150–400)
RBC: 3.1 MIL/uL — ABNORMAL LOW (ref 4.22–5.81)
RDW: 15.4 % (ref 11.5–15.5)
WBC: 10.5 10*3/uL (ref 4.0–10.5)
nRBC: 0 % (ref 0.0–0.2)

## 2018-07-09 LAB — CULTURE, RESPIRATORY W GRAM STAIN: Culture: NORMAL

## 2018-07-09 LAB — LEGIONELLA PNEUMOPHILA SEROGP 1 UR AG: L. pneumophila Serogp 1 Ur Ag: NEGATIVE

## 2018-07-09 LAB — GLUCOSE, CAPILLARY
Glucose-Capillary: 88 mg/dL (ref 70–99)
Glucose-Capillary: 90 mg/dL (ref 70–99)
Glucose-Capillary: 95 mg/dL (ref 70–99)
Glucose-Capillary: 88 mg/dL (ref 70–99)

## 2018-07-09 LAB — BASIC METABOLIC PANEL
Anion gap: 10 (ref 5–15)
BUN: 9 mg/dL (ref 8–23)
CO2: 21 mmol/L — ABNORMAL LOW (ref 22–32)
Calcium: 8.6 mg/dL — ABNORMAL LOW (ref 8.9–10.3)
Chloride: 106 mmol/L (ref 98–111)
Creatinine, Ser: 0.95 mg/dL (ref 0.61–1.24)
GFR calc Af Amer: >60 mL/min (ref >60)
GFR calc non Af Amer: >60 mL/min (ref >60)
Glucose, Bld: 91 mg/dL (ref 70–99)
Potassium: 4.1 mmol/L (ref 3.5–5.1)
Sodium: 137 mmol/L (ref 135–145)

## 2018-07-09 LAB — TRIGLYCERIDES: Triglycerides: 107 mg/dL (ref <150)

## 2018-07-09 MED ORDER — ENSURE ENLIVE PO LIQD
237.0000 mL | Freq: Two times a day (BID) | ORAL | Status: DC
  Administered 2018-07-09 – 2018-07-10 (×3): 237 mL via ORAL

## 2018-07-09 MED ORDER — SODIUM CHLORIDE 0.9 % IV SOLN
2.0000 g | Freq: Three times a day (TID) | INTRAVENOUS | Status: DC
  Filled 2018-07-09 (×2): qty 2

## 2018-07-09 MED ORDER — DOCUSATE SODIUM 50 MG/5ML PO LIQD: 100.0000 mg | Freq: Two times a day (BID) | ORAL | Status: DC | PRN

## 2018-07-09 NOTE — Evaluation (Signed)
Physical Therapy Evaluation Patient Details Name: EGON DIVELBISS MRN: [id number] DOB: 1940-11-13 Today's Date: 07/09/2018   History of Present Illness  Pt adm with dyspnea and hemoptysis. Pt intubated 5/30 and extubated 5/31. Pt with acute exacerbation of bronchiectasis. PMH - copd, bronchiectasis, depression, bleeding ulcer, PVD  Clinical Impression  Pt presents to PT doing fairly well with mobility. Pt impulsive. Will follow acutely but do not feel he will need PT at time of DC.     Follow Up Recommendations No PT follow up    Equipment Recommendations  None recommended by PT    Recommendations for Other Services       Precautions / Restrictions Precautions Precautions: Fall Restrictions Weight Bearing Restrictions: No      Mobility  Bed Mobility Overal bed mobility: Modified Independent                Transfers Overall transfer level: Needs assistance Equipment used: None Transfers: Sit to/from Stand Sit to Stand: Supervision         General transfer comment: Assist for lines/safet  Ambulation/Gait Ambulation/Gait assistance: Min guard Gait Distance (Feet): 150 Feet Assistive device: None Gait Pattern/deviations: Step-through pattern;Decreased stride length Gait velocity: decr Gait velocity interpretation: 1.31 - 2.62 ft/sec, indicative of limited community ambulator General Gait Details: Assist for lines/safety. Amb around room repeatedly due to pt with multiple episodes of needing commode urgently for BM per nurse report. Amb on 2L of O2 with SpO2 90%  Stairs            Wheelchair Mobility    Modified Rankin (Stroke Patients Only)       Balance Overall balance assessment: Needs assistance Sitting-balance support: No upper extremity supported;Feet supported Sitting balance-Leahy Scale: Good     Standing balance support: No upper extremity supported;During functional activity Standing balance-Leahy Scale: Good                               Pertinent Vitals/Pain Pain Assessment: No/denies pain    Home Living Family/patient expects to be discharged to:: Private residence Living Arrangements: Spouse/significant other Available Help at Discharge: Family Type of Home: House Home Access: Stairs to enter Entrance Stairs-Rails: Right;Left;Can reach both Entrance Stairs-Number of Steps: 3 Home Layout: One level Home Equipment: Environmental consultant - 2 wheels;Cane - single point;Shower seat - built in      Prior Function Level of Independence: Independent               Hand Dominance   Dominant Hand: Right    Extremity/Trunk Assessment   Upper Extremity Assessment Upper Extremity Assessment: Overall WFL for tasks assessed    Lower Extremity Assessment Lower Extremity Assessment: Overall WFL for tasks assessed       Communication   Communication: No difficulties  Cognition Arousal/Alertness: Awake/alert Behavior During Therapy: Impulsive Overall Cognitive Status: Impaired/Different from baseline Area of Impairment: Safety/judgement                         Safety/Judgement: Decreased awareness of safety;Decreased awareness of deficits            General Comments      Exercises     Assessment/Plan    PT Assessment Patient needs continued PT services  PT Problem List Decreased mobility;Decreased balance;Decreased activity tolerance       PT Treatment Interventions Gait training;Functional mobility training;Therapeutic activities;Therapeutic exercise;Balance training;Patient/family education    PT Goals (  Current goals can be found in the Care Plan section)  Acute Rehab PT Goals Patient Stated Goal: return home to dog PT Goal Formulation: With patient Time For Goal Achievement: 07/16/18 Potential to Achieve Goals: Good    Frequency Min 3X/week   Barriers to discharge        Co-evaluation               AM-PAC PT "6 Clicks" Mobility  Outcome Measure Help needed  turning from your back to your side while in a flat bed without using bedrails?: None Help needed moving from lying on your back to sitting on the side of a flat bed without using bedrails?: None Help needed moving to and from a bed to a chair (including a wheelchair)?: A Little Help needed standing up from a chair using your arms (e.g., wheelchair or bedside chair)?: A Little Help needed to walk in hospital room?: A Little Help needed climbing 3-5 steps with a railing? : A Little 6 Click Score: 20    End of Session Equipment Utilized During Treatment: Oxygen Activity Tolerance: Patient tolerated treatment well Patient left: in bed;with call bell/phone within reach;with bed alarm set Nurse Communication: Mobility status PT Visit Diagnosis: Other abnormalities of gait and mobility (R26.89)    Time: 6045-4098 PT Time Calculation (min) (ACUTE ONLY): 16 min   Charges:   PT Evaluation $PT Eval Low Complexity: 1 Low          [facility] PT Acute Rehabilitation Services Pager [phone] Office [phone]   Angelina Ok Shannon [facility] 07/09/2018, 4:04 PM

## 2018-07-09 NOTE — Progress Notes (Signed)
Patient transferred to 2C09. Patient stable on arrival. Report concluded with [place].

## 2018-07-09 NOTE — Progress Notes (Addendum)
Initial Nutrition Assessment RD working remotely.  DOCUMENTATION CODES:   Not applicable  INTERVENTION:    Ensure Enlive po BID, each supplement provides 350 kcal and 20 grams of protein   Magic cup TID with meals, each supplement provides 290 kcal and 9 grams of protein   Recommend liberalize diet to regular  NUTRITION DIAGNOSIS:   Increased nutrient needs related to chronic illness(COPD) as evidenced by estimated needs.  GOAL:   Patient will meet greater than or equal to 90% of their needs  MONITOR:   PO intake, Supplement acceptance, Labs, Skin  REASON FOR ASSESSMENT:   Consult, Malnutrition Screening Tool    ASSESSMENT:   78 yo male with PMH of PVD, COPD, depression, who was admitted with acute dyspnea and hemoptysis.   Patient was intubated 5/30-5/31. Currently on 2 L [place]. Received consult for TF, however, he is now on a PO diet.  Per nutrition screening on admission, patient has been eating poorly due to a poor appetite and has lost weight. No recent weight records from PTA available in chart. Weight has fluctuated since admission likely due to fluid shifts; 65.5 kg on 5/30, up to 74.2 kg 5/31, down to 70.4 kg today.   Labs and medications reviewed.   Suspect intake will be poor, given respiratory difficulty with hx of COPD.   NUTRITION - FOCUSED PHYSICAL EXAM:  unable to complete  Diet Order:   Diet Order            Diet Heart Room service appropriate? Yes; Fluid consistency: Thin  Diet effective now              EDUCATION NEEDS:   No education needs have been identified at this time  Skin:  Skin Assessment: Reviewed RN Assessment  Last BM:  6/1  Height:   Ht Readings from Last 1 Encounters:  07/07/18 5\' 10"  (1.778 m)    Weight:   Wt Readings from Last 1 Encounters:  07/09/18 70.4 kg    Ideal Body Weight:  75.5 kg  BMI:  Body mass index is 22.27 kg/m.  Estimated Nutritional Needs:   Kcal:  1975-8832  Protein:  90-110  gm  Fluid:  >/= 1.9 L    Molli Barrows, RD, LDN, [place] Pager [phone] After Hours Pager [phone]

## 2018-07-09 NOTE — Progress Notes (Signed)
Foley DC'd per protocol and pt's request.Small amt of blood noted upon removal will continue to monitor. Pt tolerated well. Urinal made available for pt.

## 2018-07-09 NOTE — Progress Notes (Addendum)
PULMONARY / CRITICAL CARE MEDICINE   NAME:  William Kirk, MRN:  [id number], DOB:  1941/02/06, LOS: 2 ADMISSION DATE:  07/07/2018 REFERRING MD: 07/07/2018, CHIEF COMPLAINT: Acute dyspnea, hemoptysis  BRIEF HISTORY:    78 year old male with a history of COPD, bronchiectasis, brought to the ED to evaluate progressive dyspnea, chest pain and hemoptysis.  Intubated in the emergency department 5/30.   HISTORY OF PRESENT ILLNESS   History obtained from the ED notes and staff, wife. 88 year old man with a history of COPD (FEV1 2.0, 66% predicted 11/07/2016), prior pneumonias (ICU admission 09/2016) with nodular infiltrates by CT chest, reported subsequent bronchiectasis.  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 Followed by Dr Shelle Iron at [facility].  Has been treated with abx as an outpt over the last several weeks for btx flare, most recently Augmentin. Began to have hemoptysis 4-5 days PTA.  5/30 he developed progressive dyspnea, chest pain and more hemoptysis, prompting EMS. In ED he was intubated due to high WOB and evolving resp failure. Received empiric abx for possible CAP. CT chest pending.   SIGNIFICANT PAST MEDICAL HISTORY    has a past medical history of Arthritis, Bleeding stomach ulcer (2000s), COPD (chronic obstructive pulmonary disease) (HCC), Depression, Pneumonia, and PVD (peripheral vascular disease) (HCC) (2014).   SIGNIFICANT EVENTS:  SBT initiated on 5/31  STUDIES:   CT-PA 5/30 >> no pulmonary embolism. Cylindrical bronchiectasis.  CXR 5/30-31 >> evolving RLL infiltrate  CULTURES:  Blood 5/30 >> NGTD. Resp 5/30 >> +++ WBC, rare GPC UA 5/30 >> negative  SARS CoV-2 >> negative. MRSA >> negative ANTIBIOTICS:  Vancomycin 5/30 >> 5/31 Cefepime 5/30 >> 6/1 Azithromycin 5/30 >> 6/1   LINES/TUBES:  ETT 5/30 >> 5/31  CONSULTANTS:  PCCM  SUBJECTIVE:  Stable overnight On 2LNC States "feeling great,  ready to get the hell out of here and see my dog"   CONSTITUTIONAL: BP (!) 130/58 (BP Location: Left Arm)   Pulse 74   Temp 98.1 F (36.7 C) (Oral)   Resp (!) 24   Ht 5\' 10"  (1.778 m)   Wt 70.4 kg   SpO2 96%   BMI 22.27 kg/m   I/O last 3 completed shifts: In: 2506.2 [I.V.:1766.2; NG/GT:50; IV Piggyback:690] Out: 3030 [Urine:3030]        PHYSICAL EXAM: General: thin, older adult male, reclined in bed in NAD on [facility]  Neuro:  AAOx4. PERRL. Following commands. Appropriate for age and situation  HEENT:  NCAT. Pink mmm. Trachea midline. Anicteric Sclera  Cardiovascular:  RRR s1s2 no rgm No JVD  Lungs:  No adventitious lung sounds. Symmetrical chest excursion, no accessory muscle recruitment.  Abdomen:  Soft, flat, ndnt. Normoactive x4  Musculoskeletal:  Symmetrical bulk and tone, no obvious deformity. No cyanosis Skin:  Clean dry warm, without rash   RESOLVED PROBLEM LIST  VDRF Acute renal failure, non-oliguric  AG-metabolic acidosis.   ASSESSMENT AND PLAN     Acute respiratory failure, improved.  Hx Bronchiectasis. Possible bronchiectasis exacerbation  On Home 2LNC nocturnally  Was started on empiric abx for presumed CAP. Tracheal aspirate revealing rare GPC  Plan: Extubated 5/31 Continue BDs Titrate Supplemental O2 for goal 88-92% Dc abx today  Seen at [facility] for pulmonology, would recommend follow-up 1-2 weeks after dc to evaluate home regimen   Hemoptysis, improved Likely in setting of bronchiectasis exacerbation. Home meds pletal, ASA Given the nodular characteristics of his infiltrates on most recent CT scan  09/21/2016 must also consider malignancy. Plan: Continue to hold ASA, anticoagulation  Trend H/H, transfuse per unit protocol   PVD Home pletal, ASA Plan: Holding home meds in setting of acute hemoptysis   Hx depression Plan: ? Zoloft use at home  Delirium precautions    Disposition: The patient is doing well following extubation yesterday. He is  HDS. At this time, he does not have ICU needs. We will plan to transfer the patient out of ICU and consult Internal Medicine to take over care 6/2  Best Practice / Goals of Care / Disposition.   DVT PROPHYLAXIS:SCD SUP:PPI NUTRITION: Heart healthy  MOBILITY: PT evaluate, up with assist  GOALS OF CARE:Full  FAMILY DISCUSSIONS: Patient updated at bedside DISPOSITION Currently ICU. Patient is HDS, extubated and stable from respiratory perspective. Anticipate ability to transfer out of ICU   LABS  Glucose Recent Labs  Lab 07/08/18 1101 07/08/18 1504 07/08/18 1924 07/08/18 2354 07/09/18 0403 07/09/18 0701  GLUCAP 84 83 95 93 88 90    BMET Recent Labs  Lab 07/07/18 1151 07/07/18 1622 07/08/18 0359 07/09/18 0417  NA 135 139 137 137  K 4.7 4.6 4.3 4.1  CL 101  --  109 106  CO2 20*  --  19* 21*  BUN 19  --  16 9  CREATININE 1.47*  --  1.04 0.95  GLUCOSE 221*  --  128* 91    Liver Enzymes Recent Labs  Lab 07/07/18 1151  AST 26  ALT 17  ALKPHOS 99  BILITOT 0.3  ALBUMIN 2.8*    Electrolytes Recent Labs  Lab 07/07/18 1151 07/08/18 0359 07/09/18 0417  CALCIUM 9.2 8.2* 8.6*  MG  --  1.8  --     CBC Recent Labs  Lab 07/07/18 2222 07/08/18 0359 07/09/18 0417  WBC 9.6 7.6 10.5  HGB 8.2* 8.5* 8.5*  HCT 26.7* 28.2* 27.3*  PLT 332 346 350    ABG Recent Labs  Lab 07/07/18 1622  PHART 7.227*  PCO2ART 51.0*  PO2ART 226.0*    Coag's No results for input(s): APTT, INR in the last 168 hours.  Sepsis Markers Recent Labs  Lab 07/07/18 1151 07/07/18 1400 07/07/18 1639  LATICACIDVEN 4.1* 1.2  --   PROCALCITON  --   --  0.34    Cardiac Enzymes Recent Labs  Lab 07/07/18 1151  TROPONINI <0.03     Tessie Fass MSN, AGACNP-BC [place] Pulmonary/Critical Care Medicine [phone] If no answer, [phone] 07/09/2018, 8:29 AM

## 2018-07-10 LAB — BASIC METABOLIC PANEL
Anion gap: 10 (ref 5–15)
BUN: 10 mg/dL (ref 8–23)
CO2: 21 mmol/L — ABNORMAL LOW (ref 22–32)
Calcium: 8.7 mg/dL — ABNORMAL LOW (ref 8.9–10.3)
Chloride: 107 mmol/L (ref 98–111)
Creatinine, Ser: 0.85 mg/dL (ref 0.61–1.24)
GFR calc Af Amer: >60 mL/min (ref >60)
GFR calc non Af Amer: >60 mL/min (ref >60)
Glucose, Bld: 86 mg/dL (ref 70–99)
Potassium: 3.4 mmol/L — ABNORMAL LOW (ref 3.5–5.1)
Sodium: 138 mmol/L (ref 135–145)

## 2018-07-10 LAB — CBC WITH DIFFERENTIAL/PLATELET
Abs Immature Granulocytes: 0.04 10*3/uL (ref 0.00–0.07)
Basophils Absolute: 0 10*3/uL (ref 0.0–0.1)
Basophils Relative: 0 %
Eosinophils Absolute: 0.1 10*3/uL (ref 0.0–0.5)
Eosinophils Relative: 1 %
HCT: 28 % — ABNORMAL LOW (ref 39.0–52.0)
Hemoglobin: 8.8 g/dL — ABNORMAL LOW (ref 13.0–17.0)
Immature Granulocytes: 0 %
Lymphocytes Relative: 17 %
Lymphs Abs: 1.6 10*3/uL (ref 0.7–4.0)
MCH: 27.2 pg (ref 26.0–34.0)
MCHC: 31.4 g/dL (ref 30.0–36.0)
MCV: 86.4 fL (ref 80.0–100.0)
Monocytes Absolute: 0.8 10*3/uL (ref 0.1–1.0)
Monocytes Relative: 9 %
Neutro Abs: 7.1 10*3/uL (ref 1.7–7.7)
Neutrophils Relative %: 73 %
Platelets: 350 10*3/uL (ref 150–400)
RBC: 3.24 MIL/uL — ABNORMAL LOW (ref 4.22–5.81)
RDW: 15.2 % (ref 11.5–15.5)
WBC: 9.7 10*3/uL (ref 4.0–10.5)
nRBC: 0 % (ref 0.0–0.2)

## 2018-07-10 LAB — TRIGLYCERIDES: Triglycerides: 84 mg/dL (ref <150)

## 2018-07-10 MED ORDER — GABAPENTIN 300 MG PO CAPS
300.0000 mg | ORAL_CAPSULE | Freq: Every day | ORAL | Status: DC
  Administered 2018-07-10: 300 mg via ORAL
  Filled 2018-07-10: qty 1

## 2018-07-10 MED ORDER — ALBUTEROL SULFATE (2.5 MG/3ML) 0.083% IN NEBU: 3.0000 mL | INHALATION_SOLUTION | RESPIRATORY_TRACT | Status: DC | PRN

## 2018-07-10 MED ORDER — MAGNESIUM SULFATE 2 GM/50ML IV SOLN: 2.0000 g | Freq: Once | INTRAVENOUS | Status: DC

## 2018-07-10 MED ORDER — TRAZODONE HCL 50 MG PO TABS
50.0000 mg | ORAL_TABLET | Freq: Every day | ORAL | Status: DC
  Administered 2018-07-10: 50 mg via ORAL
  Filled 2018-07-10: qty 1

## 2018-07-10 MED ORDER — SERTRALINE HCL 100 MG PO TABS
200.0000 mg | ORAL_TABLET | Freq: Every day | ORAL | Status: DC
  Administered 2018-07-10 – 2018-07-11 (×2): 200 mg via ORAL
  Filled 2018-07-10 (×2): qty 2

## 2018-07-10 MED ORDER — POTASSIUM CHLORIDE CRYS ER 20 MEQ PO TBCR
40.0000 meq | EXTENDED_RELEASE_TABLET | Freq: Once | ORAL | Status: AC
  Administered 2018-07-10: 40 meq via ORAL
  Filled 2018-07-10: qty 2

## 2018-07-10 MED ORDER — VITAMIN B-12 1000 MCG PO TABS
1000.0000 ug | ORAL_TABLET | Freq: Every day | ORAL | Status: DC
  Administered 2018-07-10 – 2018-07-11 (×2): 1000 ug via ORAL
  Filled 2018-07-10 (×2): qty 1

## 2018-07-10 MED ORDER — MOMETASONE FURO-FORMOTEROL FUM 200-5 MCG/ACT IN AERO
2.0000 | INHALATION_SPRAY | Freq: Two times a day (BID) | RESPIRATORY_TRACT | Status: DC
  Administered 2018-07-10 – 2018-07-11 (×3): 2 via RESPIRATORY_TRACT
  Filled 2018-07-10: qty 8.8

## 2018-07-10 MED ORDER — CILOSTAZOL 100 MG PO TABS: 100.0000 mg | ORAL_TABLET | Freq: Two times a day (BID) | ORAL | Status: DC

## 2018-07-10 NOTE — Progress Notes (Signed)
PROGRESS NOTE  BOYSIE LATINA AOZ:[id number] DOB: 1940-03-10 DOA: 07/07/2018 PCP: [facility]  HPI/Recap of past 24 hours: History obtained from the ED notes and staff, wife. 78 year old man with a history of COPD (FEV1 2.0, 66% predicted 11/07/2016), prior pneumonias (ICU admission 09/2016) with nodular infiltrates by CT chest, reported subsequent bronchiectasis.  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 Followed by Dr Shelle Iron at [facility].  Has been treated with abx as an outpt over the last several weeks for btx flare, most recently Augmentin. Began to have hemoptysis 4-5 days PTA.  5/30 he developed progressive dyspnea, chest pain and more hemoptysis, prompting EMS. In ED he was intubated due to high WOB and evolving resp failure. Received empiric abx for possible CAP. CT chest pending.   SIGNIFICANT PAST MEDICAL HISTORY    has a past medical history of Arthritis, Bleeding stomach ulcer (2000s), COPD (chronic obstructive pulmonary disease) (HCC), Depression, Pneumonia, and PVD (peripheral vascular disease) (HCC) (2014).   SIGNIFICANT EVENTS:  SBT initiated on 5/31  STUDIES:   CT-PA 5/30 >> no pulmonary embolism. Cylindrical bronchiectasis.  CXR 5/30-31 >> evolving RLL infiltrate  CULTURES:  Blood 5/30 >> NGTD. Resp 5/30 >> +++ WBC, rare GPC UA 5/30 >> negative  SARS CoV-2 >> negative. MRSA >> negative ANTIBIOTICS:  Vancomycin 5/30 >> 5/31 Cefepime 5/30 >> 6/1 Azithromycin 5/30 >> 6/1   LINES/TUBES:  ETT 5/30 >> 5/31  TRANSFER TO THR service on 07/10/18.  07/10/18: Patient seen and examined at his bedside.  No acute events overnight.  He reports persistent productive cough with no chest pain.  Resumed his home medications.  Monitor for any acute changes.  Assessment/Plan: Principal Problem:   Acute respiratory failure with hypoxia (HCC) Active Problems:   Thrombocytosis (HCC)  Bronchiectasis (HCC)   Protein-calorie malnutrition, severe (HCC)   COPD GOLD II    Pulmonary infiltrates   Hemoptysis   Acute renal failure (ARF) (HCC)   Acute respiratory failure (HCC)  Acute on chronic hypoxic respiratory failure likely secondary to bronchiectasis exacerbation post extubation on 07/08/2018 At baseline on 2 L nasal cannula nightly Intubated on 07/07/2018 and extubated on 07/08/2018 Maintain O2 saturation greater than 90% Continue inhaler and nebulizers Completed antibiotics on 07/09/2018 Follows up at [facility] for pulmonology Recommendation for follow-up in 1 to 2 weeks post discharge  Hemoptysis, improved likely secondary to bronchiectasis exacerbation On Pletal and aspirin at home Given the nodular characteristics of his infiltrates on most recent CT scan 09/21/2016 must also consider malignancy. Continue to hold ASA, anticoagulation  Trend H/H, transfuse per unit protocol   PVD On Pletal and aspirin at home Continue to hold until hemoglobin is stable and hemoptysis Resolved  Chronic depression/anxiety Continue Zoloft  Hypokalemia   Potassium 3.4 Magnesium 1.8 Repleted  Chronic normocytic anemia Baseline hemoglobin appears to be 10 Hemoglobin slightly improved from yesterday 8.8 from 8.5 Monitor for hemoptysis Transfuse hemoglobin equal or less than 7.0    Code Status: Full code  Family Communication: Update if okay with the patient.  Disposition Plan: Home possibly in 1 to 2 days when PCCM signs off.   Consultants:  PCCM  Procedures:  Intubation and extubation  Antimicrobials:  Completed  DVT prophylaxis: SCDs   Objective: Vitals:   07/10/18 0729 07/10/18 0817 07/10/18 1034 07/10/18 1200  BP: 139/75 (!) 124/53  126/62  Pulse: 71 76  82  Resp: (!) 23   18  Temp:  98.3 F (36.8 C) 98.3 F (36.8 C) 98 F (36.7 C)  TempSrc:  Oral Oral Oral  SpO2: 94% 96%  98%  Weight:      Height:        Intake/Output Summary (Last 24  hours) at 07/10/2018 1250 Last data filed at 07/10/2018 1215 Gross per 24 hour  Intake 440 ml  Output -  Net 440 ml   Filed Weights   07/08/18 0400 07/09/18 0500 07/10/18 0401  Weight: 74.2 kg 70.4 kg 66.6 kg    Exam:  . General: 78 y.o. year-old male well developed well nourished in no acute distress.  Alert and interactive. . Cardiovascular: Regular rate and rhythm with no rubs or gallops.  No thyromegaly or JVD noted.   Marland Kitchen Respiratory: Mild rales at bases with no wheezes noted.  Poor inspiratory effort.   . Abdomen: Soft nontender nondistended with normal bowel sounds x4 quadrants. . Musculoskeletal: No lower extremity edema. 2/4 pulses in all 4 extremities. Marland Kitchen Psychiatry: Mood is appropriate for condition and setting   Data Reviewed: CBC: Recent Labs  Lab 07/07/18 1151  07/07/18 1639 07/07/18 2222 07/08/18 0359 07/09/18 0417 07/10/18 0310  WBC 17.4*  --  14.7* 9.6 7.6 10.5 9.7  NEUTROABS 9.7*  --   --   --   --  8.2* 7.1  HGB 10.3*   < > 8.1* 8.2* 8.5* 8.5* 8.8*  HCT 34.6*   < > 26.8* 26.7* 28.2* 27.3* 28.0*  MCV 92.5  --  90.8 88.4 89.5 88.1 86.4  PLT 530*  --  339 332 346 350 350   < > = values in this interval not displayed.   Basic Metabolic Panel: Recent Labs  Lab 07/07/18 1151 07/07/18 1622 07/08/18 0359 07/09/18 0417 07/10/18 0310  NA 135 139 137 137 138  K 4.7 4.6 4.3 4.1 3.4*  CL 101  --  109 106 107  CO2 20*  --  19* 21* 21*  GLUCOSE 221*  --  128* 91 86  BUN 19  --  16 9 10   CREATININE 1.47*  --  1.04 0.95 0.85  CALCIUM 9.2  --  8.2* 8.6* 8.7*  MG  --   --  1.8  --   --    GFR: Estimated Creatinine Clearance: 69.6 mL/min (by C-G formula based on SCr of 0.85 mg/dL). Liver Function Tests: Recent Labs  Lab 07/07/18 1151  AST 26  ALT 17  ALKPHOS 99  BILITOT 0.3  PROT 7.9  ALBUMIN 2.8*   No results for input(s): LIPASE, AMYLASE in the last 168 hours. No results for input(s): AMMONIA in the last 168 hours. Coagulation Profile: No results  for input(s): INR, PROTIME in the last 168 hours. Cardiac Enzymes: Recent Labs  Lab 07/07/18 1151  TROPONINI <0.03   BNP (last 3 results) No results for input(s): PROBNP in the last 8760 hours. HbA1C: No results for input(s): HGBA1C in the last 72 hours. CBG: Recent Labs  Lab 07/08/18 2354 07/09/18 0403 07/09/18 0701 07/09/18 1109 07/09/18 1505  GLUCAP 93 88 90 95 88   Lipid Profile: Recent Labs    07/09/18 0417 07/10/18 0310  TRIG 107 84   Thyroid Function Tests: No results for input(s): TSH, T4TOTAL, FREET4, T3FREE, THYROIDAB in the last 72 hours. Anemia Panel: No results for input(s): VITAMINB12, FOLATE, FERRITIN, TIBC, IRON, RETICCTPCT in the last 72 hours. Urine analysis:    Component Value Date/Time   COLORURINE YELLOW 07/07/2018 1336  APPEARANCEUR CLEAR 07/07/2018 1336   LABSPEC 1.018 07/07/2018 1336   PHURINE 5.0 07/07/2018 1336   GLUCOSEU NEGATIVE 07/07/2018 1336   HGBUR NEGATIVE 07/07/2018 1336   BILIRUBINUR NEGATIVE 07/07/2018 1336   KETONESUR NEGATIVE 07/07/2018 1336   PROTEINUR 100 (A) 07/07/2018 1336   NITRITE NEGATIVE 07/07/2018 1336   LEUKOCYTESUR NEGATIVE 07/07/2018 1336   Sepsis Labs: @LABRCNTIP (procalcitonin:4,lacticidven:4)  ) Recent Results (from the past 240 hour(s))  SARS Coronavirus 2 (CEPHEID- Performed in [facility] hospital lab), Hosp Order     Status: None   Collection Time: 07/07/18 11:49 AM  Result Value Ref Range Status   SARS Coronavirus 2 NEGATIVE NEGATIVE Final    Comment: (NOTE) If result is NEGATIVE SARS-CoV-2 target nucleic acids are NOT DETECTED. The SARS-CoV-2 RNA is generally detectable in upper and lower  respiratory specimens during the acute phase of infection. The lowest  concentration of SARS-CoV-2 viral copies this assay can detect is 250  copies / mL. A negative result does not preclude SARS-CoV-2 infection  and should not be used as the sole basis for treatment or other  patient management decisions.  A  negative result may occur with  improper specimen collection / handling, submission of specimen other  than nasopharyngeal swab, presence of viral mutation(s) within the  areas targeted by this assay, and inadequate number of viral copies  (<250 copies / mL). A negative result must be combined with clinical  observations, patient history, and epidemiological information. If result is POSITIVE SARS-CoV-2 target nucleic acids are DETECTED. The SARS-CoV-2 RNA is generally detectable in upper and lower  respiratory specimens dur ing the acute phase of infection.  Positive  results are indicative of active infection with SARS-CoV-2.  Clinical  correlation with patient history and other diagnostic information is  necessary to determine patient infection status.  Positive results do  not rule out bacterial infection or co-infection with other viruses. If result is PRESUMPTIVE POSTIVE SARS-CoV-2 nucleic acids MAY BE PRESENT.   A presumptive positive result was obtained on the submitted specimen  and confirmed on repeat testing.  While 2019 novel coronavirus  (SARS-CoV-2) nucleic acids may be present in the submitted sample  additional confirmatory testing may be necessary for epidemiological  and / or clinical management purposes  to differentiate between  SARS-CoV-2 and other Sarbecovirus currently known to infect humans.  If clinically indicated additional testing with an alternate test  methodology [phone]) is advised. The SARS-CoV-2 RNA is generally  detectable in upper and lower respiratory sp ecimens during the acute  phase of infection. The expected result is Negative. Fact Sheet for Patients:  BoilerBrush.com.cy Fact Sheet for Healthcare Providers: https://pope.com/ This test is not yet approved or cleared by the [place] FDA and has been authorized for detection and/or diagnosis of SARS-CoV-2 by FDA under an Emergency Use  Authorization (EUA).  This EUA will remain in effect (meaning this test can be used) for the duration of the COVID-19 declaration under Section 564(b)(1) of the Act, 21 U.S.C. section 360bbb-3(b)(1), unless the authorization is terminated or revoked sooner. Performed at [facility], [address], [place], [place] [address]   Culture, blood (routine x 2)     Status: None (Preliminary result)   Collection Time: 07/07/18 11:50 AM  Result Value Ref Range Status   Specimen Description BLOOD RIGHT ANTECUBITAL  Final   Special Requests   Final    BOTTLES DRAWN AEROBIC AND ANAEROBIC Blood Culture adequate volume   Culture   Final  NO GROWTH 3 DAYS Performed at [facility], [address], [place], [place] [address]    Report Status PENDING  Incomplete  Culture, blood (routine x 2)     Status: None (Preliminary result)   Collection Time: 07/07/18 12:25 PM  Result Value Ref Range Status   Specimen Description BLOOD RIGHT ARM  Final   Special Requests   Final    BOTTLES DRAWN AEROBIC AND ANAEROBIC Blood Culture adequate volume   Culture   Final    NO GROWTH 3 DAYS Performed at [facility], [address], [place], [place] [address]    Report Status PENDING  Incomplete  MRSA PCR Screening     Status: None   Collection Time: 07/07/18  4:18 PM  Result Value Ref Range Status   MRSA by PCR NEGATIVE NEGATIVE Final    Comment:        The GeneXpert MRSA Assay (FDA approved for NASAL specimens only), is one component of a comprehensive MRSA colonization surveillance program. It is not intended to diagnose MRSA infection nor to guide or monitor treatment for MRSA infections. Performed at [facility], [address] [place], [place] [address]   Culture, respiratory (tracheal aspirate)     Status: None   Collection Time: 07/07/18  5:41 PM  Result Value Ref Range Status   Specimen Description TRACHEAL ASPIRATE  Final   Special Requests NONE  Final   Gram  Stain   Final    ABUNDANT WBC PRESENT, PREDOMINANTLY PMN RARE GRAM POSITIVE COCCI    Culture   Final    RARE Consistent with normal respiratory flora. Performed at [facility], [address] [place], [place] [address]    Report Status 07/09/2018 FINAL  Final      Studies: No results found.  Scheduled Meds: . Chlorhexidine Gluconate Cloth  6 each Topical Daily  . cilostazol  100 mg Oral BID  . feeding supplement (ENSURE ENLIVE)  237 mL Oral BID BM  . gabapentin  300 mg Oral QHS  . ipratropium-albuterol  3 mL Nebulization TID  . mouth rinse  15 mL Mouth Rinse BID  . mometasone-formoterol  2 puff Inhalation BID  . sertraline  200 mg Oral Daily  . traZODone  50 mg Oral QHS  . vitamin B-12  1,000 mcg Oral Daily    Continuous Infusions:   LOS: 3 days     Darlin Drop, MD [facility] Pager [phone]  If 7PM-7AM, please contact night-coverage [email or web address] Password [email or web address] 07/10/2018, 12:50 PM

## 2018-07-10 NOTE — Care Management Important Message (Signed)
Important Message  Patient Details  Name: William Kirk MRN: [id number] Date of Birth: 12/12/1940   Medicare Important Message Given:  Yes    Orbie Pyo 07/10/2018, 12:03 PM

## 2018-07-10 NOTE — Progress Notes (Signed)
Physical Therapy Treatment Patient Details Name: William Kirk MRN: [id number] DOB: 11-20-1940 Today's Date: 07/10/2018    History of Present Illness Pt adm with dyspnea and hemoptysis. Pt intubated 5/30 and extubated 5/31. Pt with acute exacerbation of bronchiectasis. PMH - copd, bronchiectasis, depression, bleeding ulcer, PVD    PT Comments    Patient making good progress towards goals. Patient very motivated to return home soon. Patient ambulating in hallway without AD and on RA with SpO2>93% with mobility. Instability noted with gait with patient demonstrating scissoring gait pattern at times, however able to self correct without cueing. Patient up in chair with all needs met at end of session. Will continue to follow.     Follow Up Recommendations  No PT follow up     Equipment Recommendations  None recommended by PT    Recommendations for Other Services       Precautions / Restrictions Precautions Precautions: Fall Restrictions Weight Bearing Restrictions: No    Mobility  Bed Mobility               General bed mobility comments: up in recliner  Transfers Overall transfer level: Needs assistance Equipment used: None Transfers: Sit to/from Stand;Stand Pivot Transfers Sit to Stand: Supervision;Min guard Stand pivot transfers: Supervision;Min guard       General transfer comment: for safety  Ambulation/Gait Ambulation/Gait assistance: Min guard Gait Distance (Feet): 200 Feet Assistive device: None Gait Pattern/deviations: Step-through pattern;Decreased stride length;Scissoring Gait velocity: varying   General Gait Details: patient at times with scissoring gait indicating reduced dynamic stability; cueing for safety throughout   Stairs             Wheelchair Mobility    Modified Rankin (Stroke Patients Only)       Balance Overall balance assessment: Needs assistance Sitting-balance support: No upper extremity supported;Feet  supported Sitting balance-Leahy Scale: Good     Standing balance support: No upper extremity supported;During functional activity Standing balance-Leahy Scale: Good                              Cognition Arousal/Alertness: Awake/alert Behavior During Therapy: Impulsive;WFL for tasks assessed/performed Overall Cognitive Status: Impaired/Different from baseline Area of Impairment: Safety/judgement                         Safety/Judgement: Decreased awareness of safety;Decreased awareness of deficits            Exercises      General Comments General comments (skin integrity, edema, etc.): Amb on RA with SpO2 >93% with mobility      Pertinent Vitals/Pain Pain Assessment: No/denies pain    Home Living                      Prior Function            PT Goals (current goals can now be found in the care plan section) Acute Rehab PT Goals Patient Stated Goal: return home to dog PT Goal Formulation: With patient Time For Goal Achievement: 07/16/18 Potential to Achieve Goals: Good Progress towards PT goals: Progressing toward goals    Frequency    Min 3X/week      PT Plan Current plan remains appropriate    Co-evaluation              AM-PAC PT "6 Clicks" Mobility   Outcome Measure  Help needed turning from your  back to your side while in a flat bed without using bedrails?: None Help needed moving from lying on your back to sitting on the side of a flat bed without using bedrails?: None Help needed moving to and from a bed to a chair (including a wheelchair)?: A Little Help needed standing up from a chair using your arms (e.g., wheelchair or bedside chair)?: A Little Help needed to walk in hospital room?: A Little Help needed climbing 3-5 steps with a railing? : A Little 6 Click Score: 20    End of Session Equipment Utilized During Treatment: Gait belt Activity Tolerance: Patient tolerated treatment well Patient left: in  chair;with call bell/phone within reach;with chair alarm set Nurse Communication: Mobility status PT Visit Diagnosis: Other abnormalities of gait and mobility (R26.89)     Time: 4098-1191 PT Time Calculation (min) (ACUTE ONLY): 13 min  Charges:  $Gait Training: 8-22 mins                     Kipp Laurence, PT, DPT Supplemental Physical Therapist 07/10/18 2:32 PM Pager: [phone] Office: [phone]

## 2018-07-11 DIAGNOSIS — E876 Hypokalemia: Secondary | ICD-10-CM

## 2018-07-11 DIAGNOSIS — J9621 Acute and chronic respiratory failure with hypoxia: Secondary | ICD-10-CM

## 2018-07-11 LAB — CBC WITH DIFFERENTIAL/PLATELET
Abs Immature Granulocytes: 0.05 10*3/uL (ref 0.00–0.07)
Basophils Absolute: 0 10*3/uL (ref 0.0–0.1)
Basophils Relative: 0 %
Eosinophils Absolute: 0.1 10*3/uL (ref 0.0–0.5)
Eosinophils Relative: 1 %
HCT: 28.5 % — ABNORMAL LOW (ref 39.0–52.0)
Hemoglobin: 9.1 g/dL — ABNORMAL LOW (ref 13.0–17.0)
Immature Granulocytes: 1 %
Lymphocytes Relative: 22 %
Lymphs Abs: 1.9 10*3/uL (ref 0.7–4.0)
MCH: 27.3 pg (ref 26.0–34.0)
MCHC: 31.9 g/dL (ref 30.0–36.0)
MCV: 85.6 fL (ref 80.0–100.0)
Monocytes Absolute: 0.6 10*3/uL (ref 0.1–1.0)
Monocytes Relative: 7 %
Neutro Abs: 6 10*3/uL (ref 1.7–7.7)
Neutrophils Relative %: 69 %
Platelets: 342 10*3/uL (ref 150–400)
RBC: 3.33 MIL/uL — ABNORMAL LOW (ref 4.22–5.81)
RDW: 15.3 % (ref 11.5–15.5)
WBC: 8.7 10*3/uL (ref 4.0–10.5)
nRBC: 0 % (ref 0.0–0.2)

## 2018-07-11 LAB — TRIGLYCERIDES: Triglycerides: 76 mg/dL (ref <150)

## 2018-07-11 LAB — BASIC METABOLIC PANEL
Anion gap: 10 (ref 5–15)
BUN: 13 mg/dL (ref 8–23)
CO2: 22 mmol/L (ref 22–32)
Calcium: 8.6 mg/dL — ABNORMAL LOW (ref 8.9–10.3)
Chloride: 106 mmol/L (ref 98–111)
Creatinine, Ser: 0.77 mg/dL (ref 0.61–1.24)
GFR calc Af Amer: >60 mL/min (ref >60)
GFR calc non Af Amer: >60 mL/min (ref >60)
Glucose, Bld: 102 mg/dL — ABNORMAL HIGH (ref 70–99)
Potassium: 3.4 mmol/L — ABNORMAL LOW (ref 3.5–5.1)
Sodium: 138 mmol/L (ref 135–145)

## 2018-07-11 LAB — MAGNESIUM: Magnesium: 1.8 mg/dL (ref 1.7–2.4)

## 2018-07-11 MED ORDER — ACETAMINOPHEN ER 650 MG PO TBCR: 650.0000 mg | EXTENDED_RELEASE_TABLET | Freq: Three times a day (TID) | ORAL | Status: DC | PRN

## 2018-07-11 MED ORDER — ALBUTEROL SULFATE HFA 108 (90 BASE) MCG/ACT IN AERS: 2.0000 | INHALATION_SPRAY | RESPIRATORY_TRACT | Status: AC | PRN

## 2018-07-11 MED ORDER — ALBUTEROL SULFATE (2.5 MG/3ML) 0.083% IN NEBU: 2.5000 mg | INHALATION_SOLUTION | Freq: Two times a day (BID) | RESPIRATORY_TRACT | Status: DC

## 2018-07-11 MED ORDER — POTASSIUM CHLORIDE CRYS ER 20 MEQ PO TBCR
30.0000 meq | EXTENDED_RELEASE_TABLET | ORAL | Status: AC
  Administered 2018-07-11 (×2): 30 meq via ORAL
  Filled 2018-07-11 (×2): qty 1

## 2018-07-11 NOTE — Discharge Summary (Addendum)
Physician Discharge Summary  William Kirk WNU:[id number] DOB: 08-20-1940  PCP: [facility]  Admit date: 07/07/2018 Discharge date: 07/11/2018  Recommendations for Outpatient Follow-up:  1. PCP at the [facility] in 1 week with repeat labs (CBC & BMP). 2. Dr. Marcelyn Bruins, Pulmonology at the [facility] in 1 week.  Patient reports that he has an upcoming appointment next week and is advised to keep that appointment.  Home Health: None Equipment/Devices: None  Discharge Condition: Improved and stable CODE STATUS: Full Diet recommendation: Heart healthy diet.  Discharge Diagnoses:  Principal Problem:   Acute respiratory failure with hypoxia (HCC) Active Problems:   Thrombocytosis (HCC)   Bronchiectasis (HCC)   Protein-calorie malnutrition, severe (HCC)   COPD GOLD II    Pulmonary infiltrates   Hemoptysis   Acute renal failure (ARF) (HCC)   Acute respiratory failure (HCC)   Brief Summary: 78 year old man with a history of COPD (FEV1 2.0, 66% predicted 11/07/2016), prior pneumonias (ICU admission 09/2016) with nodular infiltrates by CT chest, reported subsequent bronchiectasis.  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 Followed by Dr Shelle Iron at [facility].  Has been treated with abx as an outpt over the last several weeks for btx flare, most recently Augmentin. Began to have hemoptysis 4-5 days PTA.  5/30 he developed progressive dyspnea, chest pain and more hemoptysis, prompting EMS. In ED he was intubated due to high WOB and evolving resp failure. Received empiric abx for possible CAP.  Patient reports that he lives with his wife, independent of activities, uses home oxygen only at night 2 L/min.  Today he indicated that he has been having intermittent hemoptysis for quite a while now but reportedly got worse a couple days prior to this admission.  He also has PMH of arthritis, bleeding stomach ulcer,  depression and PAD.  He was admitted to ICU.  He was brought to the ED to evaluate progressive dyspnea, chest pain and hemoptysis and was intubated in the ED 5/30 and admitted to ICU by CCM.  He was extubated on 5/31 and care was transferred to [facility] on 6/2.  Assessment and plan:  1. Acute on chronic hypoxic respiratory failure: Previously on home oxygen only at night 2 L/min.  Currently worsened likely from bronchiectasis exacerbation, treated as below.  Reassessed home oxygen needs at discharge and now requires 3 L/min with activity.  Close outpatient follow-up with pulmonology to reassess ongoing needs for daytime oxygen.  Patient denies history of sleep apnea.  Extubated 5/31 and stable. 2. History of bronchiectasis with possible bronchiectasis exacerbation: He was started empirically on antibiotics for presumed community-acquired pneumonia.  SARS coronavirus 2, MRSA PCR: Negative.  Blood cultures x2: Negative to date.  Tracheal aspirate consistent with normal respiratory flora.  Antibiotics were discontinued 6/1 and patient continues to do well.  Follow-up with outpatient pulmonology at the [facility].  Continue prior home bronchodilators. 3. Hemoptysis, recurrent: Likely in the setting of bronchiectasis exacerbation.  Home aspirin and Pletal were temporarily held.  Acute hemoptysis has resolved and aspirin and Pletal will be resumed at discharge and patient advised to closely monitor for recurrence or worsening and if so then advised to stop these medications and follow-up with his pulmonologist.  As per pulmonology, given the nodular characteristics of his infiltrates on most recent CT scan 09/21/2016, must also consider malignancy for which he can follow-up with outpatient pulmonology. 4. PAD: Continue home aspirin and Pletal and see discussion  above. 5. History of depression: Stable.  Continue home dose of Zoloft and trazodone. 6. Hypokalemia: Replaced prior to discharge.  Magnesium 1.8.  Outpatient  follow-up. 7. Chronic anemia: Likely related to anemia of chronic disease.  Stable.  Continue home dose of iron supplements and B12 supplements.  Follow CBCs periodically. 8. COPD: No clinical bronchospasm or flare at this time.  Continue prior home inhaler/PRN nebulizers. 9. Essential hypertension: Antihypertensives which were briefly held in the hospital were resumed at discharge. 10. Hyperlipidemia: Continue statins. 11. Suspected chronic pain:  12. Acute kidney injury: Creatinine 1.47 on admission.  Unclear etiology.?  Poor oral intake in addition to ARB which was temporarily held in the hospital.  Resolved and creatinine has been normal for couple days now.  Resume ARB and follow BMP closely as outpatient.   Consultations:  PCCM  Procedures:  ETT 5/30 >> 5/31   Discharge Instructions  Discharge Instructions    Call MD for:   Complete by:  As directed    Recurrent or worsening coughing up of blood.   Call MD for:  difficulty breathing, headache or visual disturbances   Complete by:  As directed    Call MD for:  extreme fatigue   Complete by:  As directed    Call MD for:  persistant dizziness or light-headedness   Complete by:  As directed    Call MD for:  severe uncontrolled pain   Complete by:  As directed    Call MD for:  temperature >100.4   Complete by:  As directed    Diet - low sodium heart healthy   Complete by:  As directed    Discharge instructions   Complete by:  As directed    Oxygen via nasal cannula at 3 L/min continuously.  Please follow-up with your lung doctor to reassess regarding need for continued daytime oxygen.  You were previously on home oxygen 2 L/min at bedtime only.   Increase activity slowly   Complete by:  As directed        Medication List    STOP taking these medications   amoxicillin-clavulanate 875-125 MG tablet Commonly known as:  AUGMENTIN   protein supplement shake Liqd Commonly known as:  PREMIER PROTEIN     TAKE these  medications   acetaminophen 650 MG CR tablet Commonly known as:  TYLENOL Take 1 tablet (650 mg total) by mouth every 8 (eight) hours as needed for pain. What changed:  how much to take   albuterol 108 (90 Base) MCG/ACT inhaler Commonly known as:  VENTOLIN HFA Inhale 2 puffs into the lungs every 4 (four) hours as needed for wheezing or shortness of breath.   albuterol (2.5 MG/3ML) 0.083% nebulizer solution Commonly known as:  PROVENTIL Take 3 mLs (2.5 mg total) by nebulization 2 (two) times a day for 30 days.   aspirin EC 81 MG tablet Take 81 mg by mouth daily.   atorvastatin 20 MG tablet Commonly known as:  LIPITOR Take 20 mg by mouth at bedtime.   budesonide-formoterol 160-4.5 MCG/ACT inhaler Commonly known as:  SYMBICORT Inhale 2 puffs into the lungs 2 (two) times daily.   cilostazol 100 MG tablet Commonly known as:  PLETAL Take 100 mg by mouth 2 (two) times daily.   Ensure High Protein Liqd Take 237 mLs by mouth See admin instructions. Drink one bottle (237 mls) by mouth up to 3 times daily as a meal supplement What changed:  Another medication with the same name was  removed. Continue taking this medication, and follow the directions you see here.   ferrous sulfate 325 (65 FE) MG tablet Take 325 mg by mouth daily.   gabapentin 300 MG capsule Commonly known as:  NEURONTIN Take 300 mg by mouth at bedtime.   HYDROcodone-acetaminophen 10-325 MG tablet Commonly known as:  NORCO Take 1-2 tablets by mouth See admin instructions. Take 2 tablets by mouth every morning and 1 tablet at night   losartan 50 MG tablet Commonly known as:  COZAAR Take 75 mg by mouth daily.   multivitamin with minerals Tabs tablet Take 1 tablet by mouth daily.   OVER THE COUNTER MEDICATION Place 1 drop into both eyes daily as needed (dry eyes).   OXYGEN Inhale 2 L into the lungs at bedtime.   sertraline 100 MG tablet Commonly known as:  ZOLOFT Take 200 mg by mouth daily.   Spiriva  Respimat 2.5 MCG/ACT Aers Generic drug:  Tiotropium Bromide Monohydrate Inhale 2 puffs into the lungs daily.   testosterone cypionate 100 MG/ML injection Commonly known as:  DEPOTESTOTERONE CYPIONATE Inject 200 mg into the muscle every 28 (twenty-eight) days.   traZODone 50 MG tablet Commonly known as:  DESYREL Take 50 mg by mouth at bedtime.   vitamin B-12 1000 MCG tablet Commonly known as:  CYANOCOBALAMIN Take 1,000 mcg by mouth daily.      Follow-up Information    [facility] Schedule an appointment as soon as possible for a visit in 1 week(s).   Why:  To be seen with repeat labs (CBC & BMP). Contact information: [address] [place] [address] [phone]        Barbaraann Share, MD. Schedule an appointment as soon as possible for a visit in 1 week(s).   Specialty:  Pulmonary Disease Why:  Hospital follow-up.  Patient states that he has an upcoming appointment next week and is advised to keep that appointment. Contact information: [address] [place] [address] [phone]          No Known Allergies    Procedures/Studies: Ct Angio Chest Pe W/cm &/or Wo Cm  Result Date: 07/07/2018 CLINICAL DATA:  Chest pain EXAM: CT ANGIOGRAPHY CHEST WITH CONTRAST TECHNIQUE: Multidetector CT imaging of the chest was performed using the standard protocol during bolus administration of intravenous contrast. Multiplanar CT image reconstructions and MIPs were obtained to evaluate the vascular anatomy. CONTRAST:  OMNIPAQUE IOHEXOL 350 MG/ML SOLN COMPARISON:  09/21/2016 FINDINGS: Cardiovascular: Normal heart size. No pericardial effusion. Negative for pulmonary artery filling defect. There is shunting away from the collapsed right lower lobe. Aortic and coronary atherosclerosis. Mediastinum/Nodes: Endotracheal tube in good position with tip between the clavicular heads and carina. The orogastric tube tip is at the stomach. Mediastinal  adenopathy. Subcarinal node measures 19 mm, similar to prior. Stable milder enlargement of bilateral hilar nodes. These nodes are likely reactive Lungs/Pleura: Continued collapse of the right lower lobe with debris or stricture of apical and posterior basilar segments. 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 Reticulonodular opacity throughout bilateral lungs is improved, with decreasing nodular areas. Upper Abdomen: No acute finding Musculoskeletal: No acute or aggressive finding.  Spondylosis. Review of the MIP images confirms the above findings. IMPRESSION: Negative for pulmonary embolism. 1. History of hemoptysis with cylindrical bronchiectasis and airway narrowing in the right lower lobe has developed since 09/21/2016 chest CT. The affected segments are chronically collapsed. 2. Reticulonodular opacities throughout the lungs have improved since prior, favor  MAC infection. Electronically Signed   By: Marnee Spring M.D.   On: 07/07/2018 14:59   Dg Chest Port 1 View  Result Date: 07/08/2018 CLINICAL DATA:  Acute respiratory failure EXAM: PORTABLE CHEST 1 VIEW COMPARISON:  CT chest dated 07/07/2018 FINDINGS: Endotracheal tube terminates 4.5 cm above the carina. Enteric tube courses into the stomach. Multifocal patchy opacities in the right upper lobe and left lower lobe, suggesting pneumonia of better evaluated on recent CT. Right lower lobe atelectasis/collapse. The heart is top-normal in size. IMPRESSION: Endotracheal tube terminates 4.5 cm above the carina. Multifocal pneumonia, better evaluated on recent CT. Right lower lobe atelectasis/collapse. Electronically Signed   By: Charline Bills M.D.   On: 07/08/2018 05:11   Dg Chest Port 1 View  Result Date: 07/07/2018 CLINICAL DATA:  Respiratory difficulty EXAM: PORTABLE CHEST 1 VIEW COMPARISON:  07/07/2018 1150 hours FINDINGS: Endotracheal tube placed with its tip 5.4 cm from the carina. NG tube placed with its tip  beyond the gastroesophageal junction. Normal heart size. Heterogeneous opacities throughout both lungs are stable. There is volume loss in the right lung. No pneumothorax. Small right pleural effusion is suspected. IMPRESSION: Endotracheal and NG tubes placed as described. Small right pleural effusion is suspected. Opacities throughout both lungs are stable. Electronically Signed   By: Jolaine Click M.D.   On: 07/07/2018 13:25   Dg Chest Port 1 View  Result Date: 07/07/2018 CLINICAL DATA:  Reason for exam: SOB  Hx of COPDsob EXAM: PORTABLE CHEST 1 VIEW COMPARISON:  Chest radiograph 11/07/2016 FINDINGS: Small RIGHT effusion and basilar opacity. Chronic interstitial lung disease. No pneumothorax. Hyperinflated lungs IMPRESSION: RIGHT pleural effusion and probable atelectasis or infiltrate. Electronically Signed   By: Genevive Bi M.D.   On: 07/07/2018 12:17      Subjective: Patient states that he lives with his spouse and dogs.  "Need to get out of here".  Denies complaints.  States that his dyspnea has resolved and his breathing is back to baseline.  Reports intermittent coughing up blood for months, none over the last 2 days.  No other complaints reported.  As per RN, no acute issues noted.  Discharge Exam:  Vitals:   07/11/18 0312 07/11/18 0652 07/11/18 0800 07/11/18 0824  BP: (!) 137/52  (!) 126/52   Pulse: 67  65 71  Resp: (!) 21  16 20   Temp: 98.4 F (36.9 C)  97.6 F (36.4 C)   TempSrc:   Oral   SpO2: 98%  100% 95%  Weight:  66.3 kg    Height:        General: Pleasant elderly male, moderately built and thinly nourished sitting up comfortably in bed without distress. Cardiovascular: S1 & S2 heard, RRR, S1/S2 +. No murmurs, rubs, gallops or clicks. No JVD or pedal edema.  Telemetry personally reviewed: Sinus rhythm. Respiratory: Slightly diminished breath sounds in the bases with coarse Velcro-like crackles, suspect chronic, rest of lung fields clear to auscultation without  wheezing, rhonchi.  No increased work of breathing. Abdominal:  Non distended, non tender & soft. No organomegaly or masses appreciated. Normal bowel sounds heard. CNS: Alert and oriented. No focal deficits. Extremities: no edema, no cyanosis    The results of significant diagnostics from this hospitalization (including imaging, microbiology, ancillary and laboratory) are listed below for reference.     Microbiology: Recent Results (from the past 240 hour(s))  SARS Coronavirus 2 (CEPHEID- Performed in [facility] hospital lab), Hosp Order     Status: None  Collection Time: 07/07/18 11:49 AM  Result Value Ref Range Status   SARS Coronavirus 2 NEGATIVE NEGATIVE Final    Comment: (NOTE) If result is NEGATIVE SARS-CoV-2 target nucleic acids are NOT DETECTED. The SARS-CoV-2 RNA is generally detectable in upper and lower  respiratory specimens during the acute phase of infection. The lowest  concentration of SARS-CoV-2 viral copies this assay can detect is 250  copies / mL. A negative result does not preclude SARS-CoV-2 infection  and should not be used as the sole basis for treatment or other  patient management decisions.  A negative result may occur with  improper specimen collection / handling, submission of specimen other  than nasopharyngeal swab, presence of viral mutation(s) within the  areas targeted by this assay, and inadequate number of viral copies  (<250 copies / mL). A negative result must be combined with clinical  observations, patient history, and epidemiological information. If result is POSITIVE SARS-CoV-2 target nucleic acids are DETECTED. The SARS-CoV-2 RNA is generally detectable in upper and lower  respiratory specimens dur ing the acute phase of infection.  Positive  results are indicative of active infection with SARS-CoV-2.  Clinical  correlation with patient history and other diagnostic information is  necessary to determine patient infection status.   Positive results do  not rule out bacterial infection or co-infection with other viruses. If result is PRESUMPTIVE POSTIVE SARS-CoV-2 nucleic acids MAY BE PRESENT.   A presumptive positive result was obtained on the submitted specimen  and confirmed on repeat testing.  While 2019 novel coronavirus  (SARS-CoV-2) nucleic acids may be present in the submitted sample  additional confirmatory testing may be necessary for epidemiological  and / or clinical management purposes  to differentiate between  SARS-CoV-2 and other Sarbecovirus currently known to infect humans.  If clinically indicated additional testing with an alternate test  methodology [phone]) is advised. The SARS-CoV-2 RNA is generally  detectable in upper and lower respiratory sp ecimens during the acute  phase of infection. The expected result is Negative. Fact Sheet for Patients:  BoilerBrush.com.cy Fact Sheet for Healthcare Providers: https://pope.com/ This test is not yet approved or cleared by the [place] FDA and has been authorized for detection and/or diagnosis of SARS-CoV-2 by FDA under an Emergency Use Authorization (EUA).  This EUA will remain in effect (meaning this test can be used) for the duration of the COVID-19 declaration under Section 564(b)(1) of the Act, 21 U.S.C. section 360bbb-3(b)(1), unless the authorization is terminated or revoked sooner. Performed at [facility], [address] [place], [place] [address]   Culture, blood (routine x 2)     Status: None (Preliminary result)   Collection Time: 07/07/18 11:50 AM  Result Value Ref Range Status   Specimen Description BLOOD RIGHT ANTECUBITAL  Final   Special Requests   Final    BOTTLES DRAWN AEROBIC AND ANAEROBIC Blood Culture adequate volume   Culture   Final    NO GROWTH 4 DAYS Performed at [facility], [address], [place], [place] [address]    Report Status PENDING   Incomplete  Culture, blood (routine x 2)     Status: None (Preliminary result)   Collection Time: 07/07/18 12:25 PM  Result Value Ref Range Status   Specimen Description BLOOD RIGHT ARM  Final   Special Requests   Final    BOTTLES DRAWN AEROBIC AND ANAEROBIC Blood Culture adequate volume   Culture   Final    NO GROWTH  4 DAYS Performed at [facility], [address] [place], [place] [address]    Report Status PENDING  Incomplete  MRSA PCR Screening     Status: None   Collection Time: 07/07/18  4:18 PM  Result Value Ref Range Status   MRSA by PCR NEGATIVE NEGATIVE Final    Comment:        The GeneXpert MRSA Assay (FDA approved for NASAL specimens only), is one component of a comprehensive MRSA colonization surveillance program. It is not intended to diagnose MRSA infection nor to guide or monitor treatment for MRSA infections. Performed at [facility], [address], [place], [place] [address]   Culture, respiratory (tracheal aspirate)     Status: None   Collection Time: 07/07/18  5:41 PM  Result Value Ref Range Status   Specimen Description TRACHEAL ASPIRATE  Final   Special Requests NONE  Final   Gram Stain   Final    ABUNDANT WBC PRESENT, PREDOMINANTLY PMN RARE GRAM POSITIVE COCCI    Culture   Final    RARE Consistent with normal respiratory flora. Performed at [facility], [address] [place], [place] [address]    Report Status 07/09/2018 FINAL  Final     Labs: CBC: Recent Labs  Lab 07/07/18 1151  07/07/18 2222 07/08/18 0359 07/09/18 0417 07/10/18 0310 07/11/18 0236  WBC 17.4*   < > 9.6 7.6 10.5 9.7 8.7  NEUTROABS 9.7*  --   --   --  8.2* 7.1 6.0  HGB 10.3*   < > 8.2* 8.5* 8.5* 8.8* 9.1*  HCT 34.6*   < > 26.7* 28.2* 27.3* 28.0* 28.5*  MCV 92.5   < > 88.4 89.5 88.1 86.4 85.6  PLT 530*   < > 332 346 350 350 342   < > = values in this interval not displayed.   Basic Metabolic Panel: Recent Labs  Lab 07/07/18 1151  07/07/18 1622 07/08/18 0359 07/09/18 0417 07/10/18 0310 07/11/18 0236  NA 135 139 137 137 138 138  K 4.7 4.6 4.3 4.1 3.4* 3.4*  CL 101  --  109 106 107 106  CO2 20*  --  19* 21* 21* 22  GLUCOSE 221*  --  128* 91 86 102*  BUN 19  --  16 9 10 13   CREATININE 1.47*  --  1.04 0.95 0.85 0.77  CALCIUM 9.2  --  8.2* 8.6* 8.7* 8.6*  MG  --   --  1.8  --   --  1.8   Liver Function Tests: Recent Labs  Lab 07/07/18 1151  AST 26  ALT 17  ALKPHOS 99  BILITOT 0.3  PROT 7.9  ALBUMIN 2.8*   BNP (last 3 results) Recent Labs    07/07/18 1151  BNP 113.2*   Cardiac Enzymes: Recent Labs  Lab 07/07/18 1151  TROPONINI <0.03   CBG: Recent Labs  Lab 07/08/18 2354 07/09/18 0403 07/09/18 0701 07/09/18 1109 07/09/18 1505  GLUCAP 93 88 90 95 88   Lipid Profile Recent Labs    07/10/18 0310 07/11/18 0236  TRIG 84 76   Urinalysis    Component Value Date/Time   COLORURINE YELLOW 07/07/2018 1336   APPEARANCEUR CLEAR 07/07/2018 1336   LABSPEC 1.018 07/07/2018 1336   PHURINE 5.0 07/07/2018 1336   GLUCOSEU NEGATIVE 07/07/2018 1336   HGBUR NEGATIVE 07/07/2018 1336   BILIRUBINUR NEGATIVE 07/07/2018 1336   KETONESUR NEGATIVE 07/07/2018 1336   PROTEINUR 100 (A) 07/07/2018 1336  NITRITE NEGATIVE 07/07/2018 1336   LEUKOCYTESUR NEGATIVE 07/07/2018 1336      Time coordinating discharge: 40 minutes  SIGNED:  Marcellus Scott, MD, FACP, [facility]. [facility]  To contact the attending provider between 7A-7P or the covering provider during after hours 7P-7A, please log into the web site [email or web address] and access using universal [place] password for that web site. If you do not have the password, please call the hospital operator.

## 2018-07-11 NOTE — Discharge Instructions (Signed)

## 2018-07-11 NOTE — TOC Transition Note (Addendum)
Transition of Care [facility]) - CM/SW Discharge Note   Patient Details  Name: William Kirk MRN: [id number] Date of Birth: Feb 11, 1940  [facility] CM/SW Contact:  Maryclare Labrador, RN Phone Number: 07/11/2018, 12:27 PM   Clinical Narrative:   PTA independent from home.  Pt is on 2 liters nocturnal oxygen PTA supplied by [facility] paid for by [facility].  Pt informed CM that he has an oxygen concentrator that makes the oxygen so increase in liter flow can be achieved on current equipment however he doe not have a portPer attending pt will now need continous oxygen.  CM left VM for [place] [facility] and with pts [place] requesting call back and described time sensitivity with pt already having discharge orders.  CM also reached out to [place] and was informed that any modififications to home oxygen order will have to be recevied from the [place] directly - [facility] is also reaching out to the [place].    Update 1550:  CM received call back from [place] with the oxygen department at the [facility] - [facility] - CM faxed documents to [phone] as requested.  [facility] and have agency deliver portable tanks to pts home in care of wife this evening - wife will then bring portable tank to the hospital to allow pt to transport home.      Final next level of care: Home/Self Care Barriers to Discharge: (awaiting portable oxygen tank from [place] through [place])   Patient Goals and CMS Choice Patient states their goals for this hospitalization and ongoing recovery are:: (Pt is ready to return back to his everyday life) CMS Medicare.gov Compare Post Acute Care list provided to:: Patient Choice offered to / list presented to : Patient  Discharge Placement                       Discharge Plan and Services                                     Social  Determinants of Health (SDOH) Interventions     Readmission Risk Interventions No flowsheet data found.

## 2018-07-11 NOTE — Progress Notes (Signed)
SATURATION QUALIFICATIONS: (This note is used to comply with regulatory documentation for home oxygen)  Patient Saturations on Room Air at Rest = 96%  Patient Saturations on Room Air while Ambulating = 83%  Patient Saturations on 3 Liters of oxygen while Ambulating = 92%  Please briefly explain why patient needs home oxygen: Previous use of O2 at home only at night, utilizing 2 LPM per [place]. Ambulated 316ft with RN, on 2LPM SpO2 86, bumped to 3LPM tolerated well.

## 2018-07-11 NOTE — Plan of Care (Addendum)
  Problem: Education: Goal: Knowledge of disease or condition will improve Outcome: Adequate for Discharge Goal: Knowledge of the prescribed therapeutic regimen will improve Outcome: Adequate for Discharge Goal: Individualized Educational Video(s) Outcome: Adequate for Discharge   Problem: Activity: Goal: Ability to tolerate increased activity will improve Outcome: Adequate for Discharge Goal: Will verbalize the importance of balancing activity with adequate rest periods Outcome: Adequate for Discharge   Problem: Respiratory: Goal: Ability to maintain a clear airway will improve Outcome: Adequate for Discharge Goal: Levels of oxygenation will improve Outcome: Adequate for Discharge Goal: Ability to maintain adequate ventilation will improve Outcome: Adequate for Discharge   Problem: Education: Goal: Knowledge of General Education information will improve Description Including pain rating scale, medication(s)/side effects and non-pharmacologic comfort measures Outcome: Adequate for Discharge   Problem: Health Behavior/Discharge Planning: Goal: Ability to manage health-related needs will improve Outcome: Adequate for Discharge   Problem: Clinical Measurements: Goal: Ability to maintain clinical measurements within normal limits will improve Outcome: Adequate for Discharge Goal: Will remain free from infection Outcome: Adequate for Discharge Goal: Diagnostic test results will improve Outcome: Adequate for Discharge Goal: Respiratory complications will improve Outcome: Adequate for Discharge Goal: Cardiovascular complication will be avoided Outcome: Adequate for Discharge   Problem: Activity: Goal: Risk for activity intolerance will decrease Outcome: Adequate for Discharge   Problem: Nutrition: Goal: Adequate nutrition will be maintained Outcome: Adequate for Discharge   Problem: Coping: Goal: Level of anxiety will decrease Outcome: Adequate for Discharge    Problem: Elimination: Goal: Will not experience complications related to bowel motility Outcome: Adequate for Discharge Goal: Will not experience complications related to urinary retention Outcome: Adequate for Discharge   Problem: Pain Managment: Goal: General experience of comfort will improve Outcome: Adequate for Discharge   Problem: Safety: Goal: Ability to remain free from injury will improve Outcome: Adequate for Discharge   Problem: Skin Integrity: Goal: Risk for impaired skin integrity will decrease Outcome: Adequate for Discharge   1500: Delayed DC, awaiting home O2 to arrive. CM working on it.   DC instructions given to patient, VSS. Pt educated on new medication regimen and next doses, follow up appts, s/s to monitor, increase activity slowly. PIV DC, hemostasis achieved. All belongings sent home including glasses and dentures. Pt dressed in paper scrubs. Pt will DC via wheelchair by NT to private vehicle driven by spouse, Hoyle Sauer. CD of CT chest given to patient prior to DC.  RN called Hoyle Sauer to review DC instructions, questions answered.

## 2018-07-12 LAB — CULTURE, BLOOD (ROUTINE X 2)
Culture: NO GROWTH
Culture: NO GROWTH
Special Requests: ADEQUATE
Special Requests: ADEQUATE

## 2018-07-24 ENCOUNTER — Emergency Department (HOSPITAL_COMMUNITY): Payer: Medicare HMO

## 2018-07-24 ENCOUNTER — Other Ambulatory Visit: Payer: Self-pay

## 2018-07-24 ENCOUNTER — Encounter (HOSPITAL_COMMUNITY): Payer: Self-pay | Admitting: Emergency Medicine

## 2018-07-24 ENCOUNTER — Inpatient Hospital Stay (HOSPITAL_COMMUNITY)
Admission: EM | Admit: 2018-07-24 | Discharge: 2018-07-28 | DRG: 981 | Disposition: A | Discharge status: Home / Self Care | Payer: Medicare HMO | Attending: Internal Medicine | Admitting: Internal Medicine

## 2018-07-24 DIAGNOSIS — F329 Major depressive disorder, single episode, unspecified: Secondary | ICD-10-CM | POA: Diagnosis present

## 2018-07-24 DIAGNOSIS — R042 Hemoptysis: Secondary | ICD-10-CM | POA: Diagnosis present

## 2018-07-24 DIAGNOSIS — Z79891 Long term (current) use of opiate analgesic: Secondary | ICD-10-CM

## 2018-07-24 DIAGNOSIS — Z8711 Personal history of peptic ulcer disease: Secondary | ICD-10-CM | POA: Diagnosis not present

## 2018-07-24 DIAGNOSIS — I739 Peripheral vascular disease, unspecified: Secondary | ICD-10-CM | POA: Diagnosis present

## 2018-07-24 DIAGNOSIS — Z9981 Dependence on supplemental oxygen: Secondary | ICD-10-CM

## 2018-07-24 DIAGNOSIS — Z7951 Long term (current) use of inhaled steroids: Secondary | ICD-10-CM

## 2018-07-24 DIAGNOSIS — Z87891 Personal history of nicotine dependence: Secondary | ICD-10-CM | POA: Diagnosis not present

## 2018-07-24 DIAGNOSIS — R918 Other nonspecific abnormal finding of lung field: Secondary | ICD-10-CM | POA: Diagnosis not present

## 2018-07-24 DIAGNOSIS — J449 Chronic obstructive pulmonary disease, unspecified: Secondary | ICD-10-CM

## 2018-07-24 DIAGNOSIS — D62 Acute posthemorrhagic anemia: Secondary | ICD-10-CM | POA: Diagnosis present

## 2018-07-24 DIAGNOSIS — J479 Bronchiectasis, uncomplicated: Secondary | ICD-10-CM | POA: Diagnosis not present

## 2018-07-24 DIAGNOSIS — Z8701 Personal history of pneumonia (recurrent): Secondary | ICD-10-CM

## 2018-07-24 DIAGNOSIS — M19042 Primary osteoarthritis, left hand: Secondary | ICD-10-CM | POA: Diagnosis present

## 2018-07-24 DIAGNOSIS — F419 Anxiety disorder, unspecified: Secondary | ICD-10-CM | POA: Diagnosis present

## 2018-07-24 DIAGNOSIS — Z20828 Contact with and (suspected) exposure to other viral communicable diseases: Secondary | ICD-10-CM | POA: Diagnosis present

## 2018-07-24 DIAGNOSIS — J9601 Acute respiratory failure with hypoxia: Secondary | ICD-10-CM | POA: Diagnosis not present

## 2018-07-24 DIAGNOSIS — Z7982 Long term (current) use of aspirin: Secondary | ICD-10-CM

## 2018-07-24 DIAGNOSIS — J850 Gangrene and necrosis of lung: Secondary | ICD-10-CM | POA: Diagnosis present

## 2018-07-24 DIAGNOSIS — Z79899 Other long term (current) drug therapy: Secondary | ICD-10-CM

## 2018-07-24 DIAGNOSIS — J441 Chronic obstructive pulmonary disease with (acute) exacerbation: Secondary | ICD-10-CM | POA: Diagnosis not present

## 2018-07-24 DIAGNOSIS — E785 Hyperlipidemia, unspecified: Secondary | ICD-10-CM | POA: Diagnosis present

## 2018-07-24 DIAGNOSIS — D638 Anemia in other chronic diseases classified elsewhere: Secondary | ICD-10-CM | POA: Diagnosis present

## 2018-07-24 DIAGNOSIS — R05 Cough: Secondary | ICD-10-CM | POA: Diagnosis not present

## 2018-07-24 DIAGNOSIS — J471 Bronchiectasis with (acute) exacerbation: Secondary | ICD-10-CM | POA: Diagnosis present

## 2018-07-24 DIAGNOSIS — J9621 Acute and chronic respiratory failure with hypoxia: Secondary | ICD-10-CM | POA: Diagnosis present

## 2018-07-24 DIAGNOSIS — I10 Essential (primary) hypertension: Secondary | ICD-10-CM | POA: Diagnosis present

## 2018-07-24 DIAGNOSIS — F32A Depression, unspecified: Secondary | ICD-10-CM | POA: Diagnosis present

## 2018-07-24 DIAGNOSIS — M19041 Primary osteoarthritis, right hand: Secondary | ICD-10-CM | POA: Diagnosis present

## 2018-07-24 DIAGNOSIS — Z9889 Other specified postprocedural states: Secondary | ICD-10-CM | POA: Diagnosis not present

## 2018-07-24 LAB — COMPREHENSIVE METABOLIC PANEL
ALT: 16 U/L (ref 0–44)
AST: 19 U/L (ref 15–41)
Albumin: 2.8 g/dL — ABNORMAL LOW (ref 3.5–5.0)
Alkaline Phosphatase: 75 U/L (ref 38–126)
Anion gap: 8 (ref 5–15)
BUN: 17 mg/dL (ref 8–23)
CO2: 28 mmol/L (ref 22–32)
Calcium: 8.8 mg/dL — ABNORMAL LOW (ref 8.9–10.3)
Chloride: 101 mmol/L (ref 98–111)
Creatinine, Ser: 0.98 mg/dL (ref 0.61–1.24)
GFR calc Af Amer: >60 mL/min (ref >60)
GFR calc non Af Amer: >60 mL/min (ref >60)
Glucose, Bld: 100 mg/dL — ABNORMAL HIGH (ref 70–99)
Potassium: 4.4 mmol/L (ref 3.5–5.1)
Sodium: 137 mmol/L (ref 135–145)
Total Bilirubin: 0.5 mg/dL (ref 0.3–1.2)
Total Protein: 7 g/dL (ref 6.5–8.1)

## 2018-07-24 LAB — CBC WITH DIFFERENTIAL/PLATELET
Abs Immature Granulocytes: 0.06 10*3/uL (ref 0.00–0.07)
Basophils Absolute: 0 10*3/uL (ref 0.0–0.1)
Basophils Relative: 0 %
Eosinophils Absolute: 0.2 10*3/uL (ref 0.0–0.5)
Eosinophils Relative: 1 %
HCT: 30 % — ABNORMAL LOW (ref 39.0–52.0)
Hemoglobin: 8.8 g/dL — ABNORMAL LOW (ref 13.0–17.0)
Immature Granulocytes: 0 %
Lymphocytes Relative: 12 %
Lymphs Abs: 1.7 10*3/uL (ref 0.7–4.0)
MCH: 26.6 pg (ref 26.0–34.0)
MCHC: 29.3 g/dL — ABNORMAL LOW (ref 30.0–36.0)
MCV: 90.6 fL (ref 80.0–100.0)
Monocytes Absolute: 0.6 10*3/uL (ref 0.1–1.0)
Monocytes Relative: 4 %
Neutro Abs: 11.3 10*3/uL — ABNORMAL HIGH (ref 1.7–7.7)
Neutrophils Relative %: 83 %
Platelets: 356 10*3/uL (ref 150–400)
RBC: 3.31 MIL/uL — ABNORMAL LOW (ref 4.22–5.81)
RDW: 15.2 % (ref 11.5–15.5)
WBC: 13.8 10*3/uL — ABNORMAL HIGH (ref 4.0–10.5)
nRBC: 0 % (ref 0.0–0.2)

## 2018-07-24 LAB — CBC
HCT: 29.7 % — ABNORMAL LOW (ref 39.0–52.0)
Hemoglobin: 8.8 g/dL — ABNORMAL LOW (ref 13.0–17.0)
MCH: 26.6 pg (ref 26.0–34.0)
MCHC: 29.6 g/dL — ABNORMAL LOW (ref 30.0–36.0)
MCV: 89.7 fL (ref 80.0–100.0)
Platelets: 368 10*3/uL (ref 150–400)
RBC: 3.31 MIL/uL — ABNORMAL LOW (ref 4.22–5.81)
RDW: 15.1 % (ref 11.5–15.5)
WBC: 12.7 10*3/uL — ABNORMAL HIGH (ref 4.0–10.5)
nRBC: 0 % (ref 0.0–0.2)

## 2018-07-24 LAB — PROTIME-INR
INR: 1.1 (ref 0.8–1.2)
Prothrombin Time: 14.5 seconds (ref 11.4–15.2)

## 2018-07-24 LAB — APTT: aPTT: 34 seconds (ref 24–36)

## 2018-07-24 LAB — SARS CORONAVIRUS 2: SARS Coronavirus 2: NOT DETECTED

## 2018-07-24 MED ORDER — ALBUTEROL SULFATE HFA 108 (90 BASE) MCG/ACT IN AERS
2.0000 | INHALATION_SPRAY | RESPIRATORY_TRACT | Status: DC | PRN
  Filled 2018-07-24: qty 6.7

## 2018-07-24 MED ORDER — VITAMIN B-12 1000 MCG PO TABS
1000.0000 ug | ORAL_TABLET | Freq: Every day | ORAL | Status: DC
  Administered 2018-07-25 – 2018-07-28 (×3): 1000 ug via ORAL
  Filled 2018-07-24 (×4): qty 1

## 2018-07-24 MED ORDER — GABAPENTIN 300 MG PO CAPS
300.0000 mg | ORAL_CAPSULE | Freq: Every day | ORAL | Status: DC
  Administered 2018-07-25 – 2018-07-27 (×2): 300 mg via ORAL
  Filled 2018-07-24 (×3): qty 1

## 2018-07-24 MED ORDER — TOBRAMYCIN 300 MG/5ML IN NEBU
300.0000 mg | INHALATION_SOLUTION | Freq: Two times a day (BID) | RESPIRATORY_TRACT | Status: DC
  Administered 2018-07-26 – 2018-07-27 (×4): 300 mg via RESPIRATORY_TRACT
  Filled 2018-07-24 (×8): qty 5

## 2018-07-24 MED ORDER — MOMETASONE FURO-FORMOTEROL FUM 200-5 MCG/ACT IN AERO
2.0000 | INHALATION_SPRAY | Freq: Two times a day (BID) | RESPIRATORY_TRACT | Status: DC
  Administered 2018-07-26 – 2018-07-28 (×5): 2 via RESPIRATORY_TRACT
  Filled 2018-07-24 (×2): qty 8.8

## 2018-07-24 MED ORDER — SERTRALINE HCL 100 MG PO TABS
200.0000 mg | ORAL_TABLET | Freq: Every day | ORAL | Status: DC
  Administered 2018-07-25 – 2018-07-28 (×3): 200 mg via ORAL
  Filled 2018-07-24 (×4): qty 2

## 2018-07-24 MED ORDER — ADULT MULTIVITAMIN W/MINERALS CH
1.0000 | ORAL_TABLET | Freq: Every day | ORAL | Status: DC
  Administered 2018-07-25 – 2018-07-28 (×3): 1 via ORAL
  Filled 2018-07-24 (×4): qty 1

## 2018-07-24 MED ORDER — HYDROCODONE-ACETAMINOPHEN 10-325 MG PO TABS
2.0000 | ORAL_TABLET | Freq: Every day | ORAL | Status: DC
  Administered 2018-07-25 – 2018-07-28 (×3): 2 via ORAL
  Filled 2018-07-24 (×4): qty 2

## 2018-07-24 MED ORDER — ENSURE MAX PROTEIN PO LIQD
237.0000 mL | Freq: Three times a day (TID) | ORAL | Status: DC | PRN
  Filled 2018-07-24 (×3): qty 330

## 2018-07-24 MED ORDER — ATORVASTATIN CALCIUM 10 MG PO TABS
20.0000 mg | ORAL_TABLET | Freq: Every day | ORAL | Status: DC
  Administered 2018-07-25 – 2018-07-27 (×2): 20 mg via ORAL
  Filled 2018-07-24 (×3): qty 2

## 2018-07-24 MED ORDER — UMECLIDINIUM BROMIDE 62.5 MCG/INH IN AEPB
1.0000 | INHALATION_SPRAY | Freq: Every day | RESPIRATORY_TRACT | Status: DC
  Administered 2018-07-26 – 2018-07-28 (×3): 1 via RESPIRATORY_TRACT
  Filled 2018-07-24 (×2): qty 7

## 2018-07-24 MED ORDER — TRAZODONE HCL 50 MG PO TABS
50.0000 mg | ORAL_TABLET | Freq: Every day | ORAL | Status: DC
  Administered 2018-07-25 – 2018-07-27 (×2): 50 mg via ORAL
  Filled 2018-07-24 (×3): qty 1

## 2018-07-24 MED ORDER — HYDROCODONE-ACETAMINOPHEN 10-325 MG PO TABS
1.0000 | ORAL_TABLET | Freq: Every day | ORAL | Status: DC
  Administered 2018-07-25 – 2018-07-27 (×2): 1 via ORAL
  Filled 2018-07-24 (×3): qty 1

## 2018-07-24 MED ORDER — SODIUM CHLORIDE 0.9 % IV SOLN
500.0000 mg | INTRAVENOUS | Status: DC
  Administered 2018-07-24 – 2018-07-27 (×4): 500 mg via INTRAVENOUS
  Filled 2018-07-24 (×5): qty 500

## 2018-07-24 NOTE — Consult Note (Signed)
NAME:  William Kirk, MRN:  [id number], DOB:  23-Feb-1940, LOS: 0 ADMISSION DATE:  07/24/2018, CONSULTATION DATE:  6/16 REFERRING MD:  Dr. Rubin Payor EDP, CHIEF COMPLAINT:  SOB, Hemoptysis    Brief History   78 year old male with COPD and bronchiectasis. Recently admitted with CAP, hemoptysis and newly discharged on 3L [place] (up from 2L [place]). Now presenting with worsening hemoptysis.  History of present illness   78 year old male with PMH as below, which is significant for COPD on home O2, bronchiectasis, and PVD. He is a former patient of Dr. Sherene Sires and is followed at the [facility] by Dr. Shelle Iron. He has been managed with TIW Azithromycin and his course has been complicated by multiple pneumonias over the years including resistant bugs. He has required nocturnal O2 at home. He was recently admitted to [facility] 5/30-6/3 for presumed CAP, bronchiectasis flare, and hemoptysis. He required intubation for the initial 24 hours. He was treated with 2 days of ABX, which were discontinued once cultures were negative. Hemoptysis was felt to be secondary to bronchiectasis flare. This improved with conservative management. CT did not demonstrate focus and no bronchoscopy was done. He was discharged on 6/3.   6/16 he again presented to [facility] with complaints of hemoptysis. This has been progressive for several days and became much worse on 6/16 with continuous episodes and coughing up "pieces of meat" appearance. On Aspirin and pletal.   Past Medical History   has a past medical history of Arthritis, Bleeding stomach ulcer (2000s), COPD (chronic obstructive pulmonary disease) (HCC), Depression, Pneumonia, and PVD (peripheral vascular disease) (HCC) (2014).  Significant Hospital Events   5/30-6/3 admitted with hemoptysis requiring vent for 24 hours.  6/16 admit with hemoptysis.   Consults:  PCCM  Procedures:    Significant Diagnostic Tests:    Micro Data:  COVID- CEPHEID 6/16 >  Antimicrobials:   Azithromycin 6/16 > Tobramycin(inhaled) 6/16 >  Interim history/subjective:    Objective   Blood pressure 118/65, pulse 62, temperature (!) 97.5 F (36.4 C), temperature source Oral, resp. rate 17, height 5\' 10"  (1.778 m), weight 72.6 kg, SpO2 94 %.       No intake or output data in the 24 hours ending 07/24/18 1953 Filed Weights   07/24/18 1642  Weight: 72.6 kg    Examination: General: frail elderly male in NAD HENT: [place]/AT, PERRL, no JVD Lungs: Coarse crackles with scant expiratory wheeze.  Cardiovascular: RRR, no MRG. No edema.  Abdomen: Soft, non-tender, non-distended. Extremities: NO acute deformity or ROM limitation Neuro: awake, alert, oriented. Non-focal  Resolved Hospital Problem list     Assessment & Plan:   Acute on chronic hypoxemic respiratory failure: bronchiectasis flare vs PNA. He has  - Supplemental O2 to keep SpO2 > 90% (currently on 4L [place]) - Azithromycin - Inhaled tobramycin  - Would support intubation if necessary - Have been some discussions regarding CT surgery consult for lung volume reduction surgery. May be valuable to seek this as an inpatient.   Hemoptysis - hold home aspirin and pletal  - Close monitoring - Follow CBC  COPD without acute exacerbation - Continue home symbicort, spiriva, albuterol - Defer systemic steroids.  Best practice:  Diet: NPO Pain/Anxiety/Delirium protocol (if indicated): N/a VAP protocol (if indicated): N/a DVT prophylaxis: Per primary GI prophylaxis: Per primary Glucose control: N/a Mobility: Per primary Code Status: FULL Family Communication: Patient updated. Wife is contact.  Disposition: PCU  Labs   CBC: Recent Labs  Lab 07/24/18 1800  WBC 13.8*  NEUTROABS 11.3*  HGB 8.8*  HCT 30.0*  MCV 90.6  PLT 356    Basic Metabolic Panel: Recent Labs  Lab 07/24/18 1800  NA 137  K 4.4  CL 101  CO2 28  GLUCOSE 100*  BUN 17  CREATININE 0.98  CALCIUM 8.8*   GFR: Estimated Creatinine  Clearance: 65.9 mL/min (by C-G formula based on SCr of 0.98 mg/dL). Recent Labs  Lab 07/24/18 1800  WBC 13.8*    Liver Function Tests: Recent Labs  Lab 07/24/18 1800  AST 19  ALT 16  ALKPHOS 75  BILITOT 0.5  PROT 7.0  ALBUMIN 2.8*   No results for input(s): LIPASE, AMYLASE in the last 168 hours. No results for input(s): AMMONIA in the last 168 hours.  ABG    Component Value Date/Time   PHART 7.227 (L) 07/07/2018 1622   PCO2ART 51.0 (H) 07/07/2018 1622   PO2ART 226.0 (H) 07/07/2018 1622   HCO3 21.3 07/07/2018 1622   TCO2 23 07/07/2018 1622   ACIDBASEDEF 7.0 (H) 07/07/2018 1622   O2SAT 100.0 07/07/2018 1622     Coagulation Profile: No results for input(s): INR, PROTIME in the last 168 hours.  Cardiac Enzymes: No results for input(s): CKTOTAL, CKMB, CKMBINDEX, TROPONINI in the last 168 hours.  HbA1C: No results found for: HGBA1C  CBG: No results for input(s): GLUCAP in the last 168 hours.  Review of Systems:   Bolds are positive  Constitutional: weight loss, gain, night sweats, Fevers, chills, fatigue .  HEENT: headaches, Sore throat, sneezing, nasal congestion, post nasal drip, Difficulty swallowing, Tooth/dental problems, visual complaints visual changes, ear ache CV:  chest pain, radiates:,Orthopnea, PND, swelling in lower extremities, dizziness, palpitations, syncope.  GI  heartburn, indigestion, abdominal pain, nausea, vomiting, diarrhea, change in bowel habits, loss of appetite, bloody stools.  Resp: cough, productive: , hemoptysis, dyspnea, chest pain, pleuritic.  Skin: rash or itching or icterus GU: dysuria, change in color of urine, urgency or frequency. flank pain, hematuria  MS: joint pain or swelling. decreased range of motion  Psych: change in mood or affect. depression or anxiety.  Neuro: difficulty with speech, weakness, numbness, ataxia    Past Medical History  He,  has a past medical history of Arthritis, Bleeding stomach ulcer (2000s), COPD  (chronic obstructive pulmonary disease) (HCC), Depression, Pneumonia, and PVD (peripheral vascular disease) (HCC) (2014).   Surgical History    Past Surgical History:  Procedure Laterality Date  . JOINT REPLACEMENT    . KNEE ARTHROSCOPY Right X 1  . TOTAL HIP ARTHROPLASTY Right 2009     Social History   reports that he quit smoking about 3 years ago. His smoking use included cigarettes. He has a 55.00 pack-year smoking history. He has never used smokeless tobacco. He reports that he does not drink alcohol or use drugs.   Family History   His family history includes Cancer (age of onset: 3) in his father; Dementia in his mother.   Allergies No Known Allergies   Home Medications  Prior to Admission medications   Medication Sig Start Date End Date Taking? Authorizing Provider  acetaminophen (TYLENOL) 650 MG CR tablet Take 1 tablet (650 mg total) by mouth every 8 (eight) hours as needed for pain. 07/11/18  Yes Hongalgi, Maximino Greenland, MD  albuterol (PROVENTIL) (2.5 MG/3ML) 0.083% nebulizer solution Take 3 mLs (2.5 mg total) by nebulization 2 (two) times a day for 30 days. 07/11/18 08/10/18 Yes Hongalgi, Maximino Greenland, MD  albuterol (VENTOLIN HFA) 108 (90 Base) MCG/ACT inhaler Inhale 2 puffs into the lungs every 4 (four) hours as needed for wheezing or shortness of breath. 07/11/18  Yes Hongalgi, Maximino Greenland, MD  aspirin EC 81 MG tablet Take 81 mg by mouth daily.   Yes [provider]  atorvastatin (LIPITOR) 20 MG tablet Take 20 mg by mouth at bedtime.   Yes [provider]  budesonide-formoterol (SYMBICORT) 160-4.5 MCG/ACT inhaler Inhale 2 puffs into the lungs 2 (two) times daily.   Yes [provider]  cilostazol (PLETAL) 100 MG tablet Take 100 mg by mouth 2 (two) times daily.    Yes [provider]  gabapentin (NEURONTIN) 300 MG capsule Take 300 mg by mouth at bedtime.    Yes [provider]  HYDROcodone-acetaminophen (NORCO) 10-325 MG tablet Take 1-2 tablets by  mouth See admin instructions. Take 2 tablets by mouth every morning and 1 tablet at night   Yes [provider]  losartan (COZAAR) 50 MG tablet Take 75 mg by mouth daily.   Yes [provider]  Multiple Vitamin (MULTIVITAMIN WITH MINERALS) TABS tablet Take 1 tablet by mouth daily.   Yes [provider]  OVER THE COUNTER MEDICATION Place 1 drop into both eyes daily as needed (dry eyes).   Yes [provider]  OXYGEN Inhale 2 L into the lungs at bedtime.    Yes [provider]  sertraline (ZOLOFT) 100 MG tablet Take 200 mg by mouth daily.    Yes [provider]  Tiotropium Bromide Monohydrate (SPIRIVA RESPIMAT) 2.5 MCG/ACT AERS Inhale 2 puffs into the lungs daily.   Yes [provider]  traZODone (DESYREL) 50 MG tablet Take 50 mg by mouth at bedtime.   Yes [provider]  vitamin B-12 (CYANOCOBALAMIN) 1000 MCG tablet Take 1,000 mcg by mouth daily.   Yes [provider]  Nutritional Supplements (ENSURE HIGH PROTEIN) LIQD Take 237 mLs by mouth See admin instructions. Drink one bottle (237 mls) by mouth up to 3 times daily as a meal supplement    [provider]  testosterone cypionate (DEPOTESTOTERONE CYPIONATE) 100 MG/ML injection Inject 200 mg into the muscle every 28 (twenty-eight) days.     [provider]     Critical care time:      Joneen Roach, AGACNP-BC [facility] Pulmonary/Critical Care Pager [phone] or [phone]  07/24/2018 8:37 PM

## 2018-07-24 NOTE — H&P (Signed)
History and Physical    William Kirk:[id number] DOB: [birth year] DOA: 07/24/2018  PCP: [facility] Patient coming from: Home  Chief Complaint: Cough, hemoptysis  HPI: William Kirk is a 78 y.o. male with medical history significant of COPD, bronchiectasis, PVD, arthritis, depression, hypertension presenting to the hospital for evaluation of cough and hemoptysis.  Patient was recently intubated and admitted to the ICU on Jul 07, 2018 for presumed CAP, bronchiectasis flare, and hemoptysis.  He required intubation for the initial 24 hours.  He was treated with 2 days of antibiotics which were discontinued once cultures were negative.  Hemoptysis was thought to be secondary to bronchiectasis flare.  CT did not demonstrate a focus and no bronchoscopy was done.  He was discharged on June 3.  Patient states he was doing okay since he left the hospital but then started coughing up blood again today.  States he has coughed up approximately 1 L of red blood/ mixed with dark chunks of blood at home.  He takes aspirin and Pletal at home.  Denies any chest pain.  Denies any fevers or chills.  States he uses 2 to 3 L oxygen at home mostly at night.  He has no other complaints.  ED Course: Hemodynamically stable.  Requiring 4 L supplemental oxygen via nasal cannula.  White count 13.8.  Hemoglobin 8.8, recent baseline in the 8-9 range.  COVID-19 rapid test pending.  Chest x-ray showing trace right-sided pleural effusion, persistent bronchiectasis bilaterally.  Scattered bilateral airspace opacities likely similar to prior CT.  Persistent opacity in the medial right lung base, likely similar to prior CT. ED provider discussed with Dr. Shelle Iron, [facility] who is worried that this hemoptysis could progress to massive hemoptysis like he had last month that required admission and intubation. Pulmonology was consulted.  Review of Systems:  All systems reviewed and apart from  history of presenting illness, are negative.  Past Medical History:  Diagnosis Date  . Arthritis    "hands" (09/21/2016)  . Bleeding stomach ulcer 2000s  . COPD (chronic obstructive pulmonary disease) (HCC)   . Depression   . Pneumonia    "now and several times before this" (09/21/2016)  . PVD (peripheral vascular disease) (HCC) 2014   prev PTCA on RLE    Past Surgical History:  Procedure Laterality Date  . JOINT REPLACEMENT    . KNEE ARTHROSCOPY Right X 1  . TOTAL HIP ARTHROPLASTY Right 2009     reports that he quit smoking about 3 years ago. His smoking use included cigarettes. He has a 55.00 pack-year smoking history. He has never used smokeless tobacco. He reports that he does not drink alcohol or use drugs.  No Known Allergies  Family History  Problem Relation Age of Onset  . Cancer Father 40       Brain  . Dementia Mother     Prior to Admission medications   Medication Sig Start Date End Date Taking? Authorizing Provider  acetaminophen (TYLENOL) 650 MG CR tablet Take 1 tablet (650 mg total) by mouth every 8 (eight) hours as needed for pain. 07/11/18  Yes Hongalgi, Maximino Greenland, MD  albuterol (PROVENTIL) (2.5 MG/3ML) 0.083% nebulizer solution Take 3 mLs (2.5 mg total) by nebulization 2 (two) times a day for 30 days. 07/11/18 08/10/18 Yes Hongalgi, Maximino Greenland, MD  albuterol (VENTOLIN HFA) 108 (90 Base) MCG/ACT inhaler Inhale 2 puffs into the lungs every 4 (four) hours as needed for wheezing or shortness  of breath. 07/11/18  Yes Hongalgi, Maximino Greenland, MD  aspirin EC 81 MG tablet Take 81 mg by mouth daily.   Yes [provider]  atorvastatin (LIPITOR) 20 MG tablet Take 20 mg by mouth at bedtime.   Yes [provider]  budesonide-formoterol (SYMBICORT) 160-4.5 MCG/ACT inhaler Inhale 2 puffs into the lungs 2 (two) times daily.   Yes [provider]  cilostazol (PLETAL) 100 MG tablet Take 100 mg by mouth 2 (two) times daily.    Yes [provider]  gabapentin  (NEURONTIN) 300 MG capsule Take 300 mg by mouth at bedtime.    Yes [provider]  HYDROcodone-acetaminophen (NORCO) 10-325 MG tablet Take 1-2 tablets by mouth See admin instructions. Take 2 tablets by mouth every morning and 1 tablet at night   Yes [provider]  losartan (COZAAR) 50 MG tablet Take 75 mg by mouth daily.   Yes [provider]  Multiple Vitamin (MULTIVITAMIN WITH MINERALS) TABS tablet Take 1 tablet by mouth daily.   Yes [provider]  OVER THE COUNTER MEDICATION Place 1 drop into both eyes daily as needed (dry eyes).   Yes [provider]  OXYGEN Inhale 2 L into the lungs at bedtime.    Yes [provider]  sertraline (ZOLOFT) 100 MG tablet Take 200 mg by mouth daily.    Yes [provider]  Tiotropium Bromide Monohydrate (SPIRIVA RESPIMAT) 2.5 MCG/ACT AERS Inhale 2 puffs into the lungs daily.   Yes [provider]  traZODone (DESYREL) 50 MG tablet Take 50 mg by mouth at bedtime.   Yes [provider]  vitamin B-12 (CYANOCOBALAMIN) 1000 MCG tablet Take 1,000 mcg by mouth daily.   Yes [provider]  Nutritional Supplements (ENSURE HIGH PROTEIN) LIQD Take 237 mLs by mouth See admin instructions. Drink one bottle (237 mls) by mouth up to 3 times daily as a meal supplement    [provider]  testosterone cypionate (DEPOTESTOTERONE CYPIONATE) 100 MG/ML injection Inject 200 mg into the muscle every 28 (twenty-eight) days.     [provider]    Physical Exam: Vitals:   07/24/18 1849 07/24/18 1930 07/24/18 1945 07/24/18 2147  BP: (!) 128/55 118/65 (!) 147/57 (!) 124/55  Pulse: 61 62 64 (!) 58  Resp: 19 17    Temp:      TempSrc:      SpO2: 98% 94% 95% 99%  Weight:      Height:        Physical Exam  Constitutional: He is oriented to person, place, and time. No distress.  Frail elderly male  HENT:  Head: Normocephalic.  Dried blood noted around the mouth  Eyes:  Right eye exhibits no discharge. Left eye exhibits no discharge.  Neck: Neck supple.  Cardiovascular: Normal rate, regular rhythm and intact distal pulses.  Pulmonary/Chest: Effort normal. He has no wheezes. He has no rales.  On 4 L supplemental oxygen  Abdominal: Soft. Bowel sounds are normal. He exhibits no distension. There is no abdominal tenderness. There is no guarding.  Musculoskeletal:        General: No edema.  Neurological: He is oriented to person, place, and time.  Skin: Skin is warm and dry. He is not diaphoretic.     Labs on Admission: I have personally reviewed following labs and imaging studies  CBC: Recent Labs  Lab 07/24/18 1800 07/24/18 2135  WBC 13.8* 12.7*  NEUTROABS 11.3*  --   HGB 8.8* 8.8*  HCT 30.0* 29.7*  MCV 90.6 89.7  PLT 356 368   Basic Metabolic Panel: Recent Labs  Lab 07/24/18 1800  NA 137  K 4.4  CL 101  CO2 28  GLUCOSE 100*  BUN 17  CREATININE 0.98  CALCIUM 8.8*   GFR: Estimated Creatinine Clearance: 65.9 mL/min (by C-G formula based on SCr of 0.98 mg/dL). Liver Function Tests: Recent Labs  Lab 07/24/18 1800  AST 19  ALT 16  ALKPHOS 75  BILITOT 0.5  PROT 7.0  ALBUMIN 2.8*   No results for input(s): LIPASE, AMYLASE in the last 168 hours. No results for input(s): AMMONIA in the last 168 hours. Coagulation Profile: Recent Labs  Lab 07/24/18 2135  INR 1.1   Cardiac Enzymes: No results for input(s): CKTOTAL, CKMB, CKMBINDEX, TROPONINI in the last 168 hours. BNP (last 3 results) No results for input(s): PROBNP in the last 8760 hours. HbA1C: No results for input(s): HGBA1C in the last 72 hours. CBG: No results for input(s): GLUCAP in the last 168 hours. Lipid Profile: No results for input(s): CHOL, HDL, LDLCALC, TRIG, CHOLHDL, LDLDIRECT in the last 72 hours. Thyroid Function Tests: No results for input(s): TSH, T4TOTAL, FREET4, T3FREE, THYROIDAB in the last 72 hours. Anemia Panel: No results for input(s): VITAMINB12,  FOLATE, FERRITIN, TIBC, IRON, RETICCTPCT in the last 72 hours. Urine analysis:    Component Value Date/Time   COLORURINE YELLOW 07/07/2018 1336   APPEARANCEUR CLEAR 07/07/2018 1336   LABSPEC 1.018 07/07/2018 1336   PHURINE 5.0 07/07/2018 1336   GLUCOSEU NEGATIVE 07/07/2018 1336   HGBUR NEGATIVE 07/07/2018 1336   BILIRUBINUR NEGATIVE 07/07/2018 1336   KETONESUR NEGATIVE 07/07/2018 1336   PROTEINUR 100 (A) 07/07/2018 1336   NITRITE NEGATIVE 07/07/2018 1336   LEUKOCYTESUR NEGATIVE 07/07/2018 1336    Radiological Exams on Admission: Dg Chest 2 View  Result Date: 07/24/2018 CLINICAL DATA:  Cough and hemoptysis EXAM: CHEST - 2 VIEW COMPARISON:  07/08/2018.  CT dated 07/07/2018 FINDINGS: Bronchiectasis is again noted bilaterally. Emphysematous changes are noted. There is a medial right lung base opacity which is similar to prior CT. The lungs are hyperexpanded. There is chronic pleuroparenchymal scarring bilaterally. There is blunting of the right costophrenic angle. There is no large focal area of consolidation. There is no pneumothorax. IMPRESSION: 1. Trace right-sided pleural effusion. 2. Persistent bronchiectasis bilaterally. Scattered bilateral airspace opacities are again noted, likely similar to prior CT given differences in technique. 3. Persistent opacity in the medial right lung base, likely similar to prior CT given differences in technique. Electronically Signed   By: Katherine Mantle M.D.   On: 07/24/2018 18:17    EKG: Independently reviewed.  Sinus rhythm (heart rate 64), artifact.  Assessment/Plan Principal Problem:   Hemoptysis Active Problems:   Acute respiratory failure with hypoxia (HCC)   Bronchiectasis (HCC)   Depression   Chronic obstructive pulmonary disease (HCC)   Hemoptysis in the setting of bronchiectasis, acute on chronic hypoxic respiratory failure Hemodynamically stable.  Requiring 4 L supplemental oxygen via nasal cannula. Hemoglobin 8.8 on arrival, recent  baseline in the 8-9 range.  Repeat CBC showing stable hemoglobin.  INR 1.1.  Chest x-ray showing trace right-sided pleural effusion, persistent bronchiectasis bilaterally.  Scattered bilateral airspace opacities likely similar to prior CT.  Persistent opacity in the medial right lung base, likely similar to prior CT. Patient is afebrile. White count mildly elevated at 13.8.  COVID-19 rapid test negative. -Monitor in the progressive care unit -Continue supplemental oxygen -Hold all anticoagulation/antiplatelet agents  at this time -IV azithromycin and inhaled tobramycin given history of resistant organisms including colonization with Pseudomonas -Continue home inhalers including Symbicort, tiotropium, and albuterol -Flutter valve -Serial CBCs -If symptoms worsen or shortness of breath increases, patient will need chest CTA and possible intubation -Consult CT surgery in a.m. to discuss lung volume reduction surgery -Pulmonology following, appreciate recommendations  PAD -Hold aspirin and Pletal  Depression -Continue home Zoloft  COPD -Stable.  No wheezing.  Continue home inhalers.  Hypertension -Normotensive.  Continue to monitor.  Hyperlipidemia -Continue Lipitor  DVT prophylaxis: SCDs Code Status: Patient wishes to be full code. Family Communication: No family available at this time. Disposition Plan: Anticipate discharge after clinical improvement. Consults called: PCCM Admission status: It is my clinical opinion that referral for OBSERVATION is reasonable and necessary in this patient based on the above information provided. The aforementioned taken together are felt to place the patient at high risk for further clinical deterioration. However it is anticipated that the patient may be medically stable for discharge from the hospital within 24 to 48 hours.  The medical decision making on this patient was of high complexity and the patient is at high risk for clinical deterioration,  therefore this is a level 3 visit.  Cove Giovanni MD [facility] Pager [phone]  If 7PM-7AM, please contact night-coverage [email or web address] Password [email or web address]  07/24/2018, 11:00 PM

## 2018-07-24 NOTE — Progress Notes (Signed)
eLink Physician-Brief Progress Note Patient Name: William Kirk DOB: [birth year] MRN: [id number]   Date of Service  07/24/2018  HPI/Events of Note  78 year old man with COPD, bronchiectasis and recent respiratory failure that needed mechanical ventilation, now back with hemoptysis. Consult for hemoptysis.   eICU Interventions  Notified bedside team for assessment.      Intervention Category Major Interventions: Hemorrhage - evaluation and management;Respiratory failure - evaluation and management  Margaretmary Lombard 07/24/2018, 7:43 PM

## 2018-07-24 NOTE — ED Notes (Signed)
Family at bedside. 

## 2018-07-24 NOTE — ED Triage Notes (Addendum)
Pt brought in by [facility], Pt reports coughing up blood for several weeks. Pt reports being seen here and admitted recently. Pt was on a medication(unsure) and it was helping until a few days ago. Pt denies CP/SHOB, dizziness, or weakness. Pt typically wears 2 L [place] sats 86%, EMS increased to 4L [place] sats were 93-94%.

## 2018-07-24 NOTE — ED Provider Notes (Signed)
[facility] EMERGENCY DEPARTMENT Provider Note   CSN: [id number] Arrival date & time: 07/24/18  1635    History   Chief Complaint Chief Complaint  Patient presents with  . Cough    hemoptysis    HPI William Kirk is a 78 y.o. male.     HPI Patient presents with cough and hemoptysis.  Was inpatient for bronchiectasis up until around 2 weeks ago.  Had been doing well but does have chronic oxygen at home.  States for last couple days has been coughing up more blood.  Has had some clots.  Has had some mild shortness of breath but no chest pain.  No lightheadedness or dizziness.  Had to increase the oxygen little more today.  He is on aspirin and Pletal for peripheral vascular disease.  No chest pain.  No fevers.  States Dr. Gwenette Greet, his pulmonologist is attempting to get him in with cardiothoracic surgery to have part of his lung removed. Past Medical History:  Diagnosis Date  . Arthritis    "hands" (09/21/2016)  . Bleeding stomach ulcer 2000s  . COPD (chronic obstructive pulmonary disease) ([place])   . Depression   . Pneumonia    "now and several times before this" (09/21/2016)  . PVD (peripheral vascular disease) ([place]) 2014   prev PTCA on RLE    Patient Active Problem List   Diagnosis Date Noted  . Hemoptysis 07/07/2018  . Acute renal failure (ARF) ([place]) 07/07/2018  . Acute respiratory failure ([place]) 07/07/2018  . COPD GOLD II  11/07/2016  . Chronic respiratory failure with hypoxia ([place]) 11/07/2016  . Pulmonary infiltrates 11/07/2016  . Swallowing dysfunction 09/26/2016  . Depression 09/21/2016  . Aspiration pneumonia ([place]) 09/21/2016  . HTN (hypertension) 09/21/2016  . HCAP (healthcare-associated pneumonia)   . COPD with acute exacerbation ([place]) 06/21/2016  . Acute respiratory failure with hypoxia ([place]) 06/21/2016  . Hypokalemia 06/21/2016  . Hyponatremia 06/21/2016  . Thrombocytosis ([place]) 06/21/2016  . PVD (peripheral vascular disease) ([place])  06/21/2016  . Bronchiectasis ([place]) 06/21/2016  . Protein-calorie malnutrition, severe ([place]) 06/21/2016  . Multifocal pneumonia 06/20/2016  . FUO (fever of unknown origin) 04/15/2015  . 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 Other tear of medial meniscus, current injury, unspecified knee, initial encounter 12/03/2012    Past Surgical History:  Procedure Laterality Date  . JOINT REPLACEMENT    . KNEE ARTHROSCOPY Right X 1  . TOTAL HIP ARTHROPLASTY Right 2009        Home Medications    Prior to Admission medications   Medication Sig Start Date End Date Taking? Authorizing Provider  acetaminophen (TYLENOL) 650 MG CR tablet Take 1 tablet (650 mg total) by mouth every 8 (eight) hours as needed for pain. 07/11/18  Yes Hongalgi, Lenis Dickinson, MD  albuterol (PROVENTIL) (2.5 MG/3ML) 0.083% nebulizer solution Take 3 mLs (2.5 mg total) by nebulization 2 (two) times a day for 30 days. 07/11/18 08/10/18 Yes Hongalgi, Lenis Dickinson, MD  albuterol (VENTOLIN HFA) 108 (90 Base) MCG/ACT inhaler Inhale 2 puffs into the lungs every 4 (four) hours as needed for wheezing or shortness of breath. 07/11/18  Yes Hongalgi, Lenis Dickinson, MD  aspirin EC 81 MG tablet Take 81 mg by mouth daily.   Yes [provider]  atorvastatin (LIPITOR) 20 MG tablet Take 20 mg by mouth at bedtime.   Yes [provider]  budesonide-formoterol (SYMBICORT) 160-4.5 MCG/ACT inhaler Inhale  2 puffs into the lungs 2 (two) times daily.   Yes [provider]  cilostazol (PLETAL) 100 MG tablet Take 100 mg by mouth 2 (two) times daily.    Yes [provider]  gabapentin (NEURONTIN) 300 MG capsule Take 300 mg by mouth at bedtime.    Yes [provider]  HYDROcodone-acetaminophen (NORCO) 10-325 MG tablet Take 1-2 tablets by mouth See admin instructions. Take 2 tablets by mouth every morning and 1 tablet at night   Yes [provider]  losartan (COZAAR) 50 MG tablet Take 75 mg by mouth daily.   Yes [provider]  Multiple Vitamin (MULTIVITAMIN WITH MINERALS) TABS tablet Take 1 tablet by mouth daily.   Yes [provider]  OVER THE COUNTER MEDICATION Place 1 drop into both eyes daily as needed (dry eyes).   Yes [provider]  OXYGEN Inhale 2 L into the lungs at bedtime.    Yes [provider]  sertraline (ZOLOFT) 100 MG tablet Take 200 mg by mouth daily.    Yes [provider]  Tiotropium Bromide Monohydrate (SPIRIVA RESPIMAT) 2.5 MCG/ACT AERS Inhale 2 puffs into the lungs daily.   Yes [provider]  traZODone (DESYREL) 50 MG tablet Take 50 mg by mouth at bedtime.   Yes [provider]  vitamin B-12 (CYANOCOBALAMIN) 1000 MCG tablet Take 1,000 mcg by mouth daily.   Yes [provider]  Nutritional Supplements (ENSURE HIGH PROTEIN) LIQD Take 237 mLs by mouth See admin instructions. Drink one bottle (237 mls) by mouth up to 3 times daily as a meal supplement    [provider]  testosterone cypionate (DEPOTESTOTERONE CYPIONATE) 100 MG/ML injection Inject 200 mg into the muscle every 28 (twenty-eight) days.     [provider]    Family History Family History  Problem Relation Age of Onset  . Cancer Father 61       Brain  . Dementia Mother     Social History Social History   Tobacco Use  . Smoking status: Former Smoker    Packs/day: 1.00    Years: 55.00    Pack years: 55.00    Types: Cigarettes    Quit date: 04/2015    Years since quitting: 3.2  . Smokeless tobacco: Never Used  Substance Use Topics  . Alcohol use: No    Alcohol/week: 0.0 standard drinks  . Drug use: No     Allergies   Patient has no known allergies.   Review of Systems Review of Systems  Constitutional: Negative for appetite change.  HENT: Negative for congestion.   Respiratory: Positive for cough and shortness of breath.  Negative for wheezing.   Cardiovascular: Negative for chest pain.  Gastrointestinal: Negative for abdominal pain.  Musculoskeletal: Negative for back pain.  Skin: Negative for rash.  Neurological: Negative for weakness.  Psychiatric/Behavioral: Negative for confusion.     Physical Exam Updated Vital Signs BP (!) 128/55   Pulse 61   Temp (!) 97.5 F (36.4 C) (Oral)   Resp 19   Ht 5\' 10"  (1.778 m)   Wt 72.6 kg   SpO2 98%   BMI 22.96 kg/m   Physical Exam Vitals signs and nursing note reviewed.  HENT:     Head: Normocephalic.  Neck:     Musculoskeletal: Neck supple.  Cardiovascular:     Rate and Rhythm: Regular rhythm.  Pulmonary:     Comments: Mildly harsh breath sounds without much dyspnea. Abdominal:  Tenderness: There is no abdominal tenderness.  Musculoskeletal:     Right lower leg: No edema.     Left lower leg: No edema.  Skin:    General: Skin is warm.     Capillary Refill: Capillary refill takes less than 2 seconds.  Neurological:     Mental Status: He is alert and oriented to person, place, and time. Mental status is at baseline.      ED Treatments / Results  Labs (all labs ordered are listed, but only abnormal results are displayed) Labs Reviewed  COMPREHENSIVE METABOLIC PANEL - Abnormal; Notable for the following components:      Result Value   Glucose, Bld 100 (*)    Calcium 8.8 (*)    Albumin 2.8 (*)    All other components within normal limits  CBC WITH DIFFERENTIAL/PLATELET - Abnormal; Notable for the following components:   WBC 13.8 (*)    RBC 3.31 (*)    Hemoglobin 8.8 (*)    HCT 30.0 (*)    MCHC 29.3 (*)    Neutro Abs 11.3 (*)    All other components within normal limits    EKG EKG Interpretation  Date/Time:  Tuesday July 24 2018 16:40:30 EDT Ventricular Rate:  64 PR Interval:    QRS Duration: 103 QT Interval:  439 QTC Calculation: 453 R Axis:   77 Text Interpretation:  Sinus rhythm Confirmed by Davonna Belling [phone]) on  07/24/2018 5:34:34 PM   Radiology Dg Chest 2 View  Result Date: 07/24/2018 CLINICAL DATA:  Cough and hemoptysis EXAM: CHEST - 2 VIEW COMPARISON:  07/08/2018.  CT dated 07/07/2018 FINDINGS: Bronchiectasis is again noted bilaterally. Emphysematous changes are noted. There is a medial right lung base opacity which is similar to prior CT. The lungs are hyperexpanded. There is chronic pleuroparenchymal scarring bilaterally. There is blunting of the right costophrenic angle. There is no large focal area of consolidation. There is no pneumothorax. IMPRESSION: 1. Trace right-sided pleural effusion. 2. Persistent bronchiectasis bilaterally. Scattered bilateral airspace opacities are again noted, likely similar to prior CT given differences in technique. 3. Persistent opacity in the medial right lung base, likely similar to prior CT given differences in technique. Electronically Signed   By: Constance Holster M.D.   On: 07/24/2018 18:17    Procedures Procedures (including critical care time)  Medications Ordered in ED Medications - No data to display   Initial Impression / Assessment and Plan / ED Course  I have reviewed the triage vital signs and the nursing notes.  Pertinent labs & imaging results that were available during my care of the patient were reviewed by me and considered in my medical decision making (see chart for details).        Patient presents with hemoptysis and shortness of breath.  Recent admission to same.  At that admission required intubation.  Has bronchiectasis and has a necrotic lobe of his lung.  Hemoglobin is stable.  Does have a bag with blood clots in it that he has coughed up.  Slight increase in his oxygen requirement also.  Discussed with Dr. Gwenette Greet, the patient's pulmonologist the [facility].  Thinks patient benefit from admission to the hospital for monitoring and likely cardiothoracic consult in terms of possible removal of the necrotic area.  He is worried that this  hemoptysis could progress to massive hemoptysis like he had last month that required admission and intubation.  Final Clinical Impressions(s) / ED Diagnoses   Final diagnoses:  Hemoptysis  ED Discharge Orders    None       Davonna Belling, MD 07/24/18 [phone]

## 2018-07-25 ENCOUNTER — Encounter (HOSPITAL_COMMUNITY): Payer: Self-pay

## 2018-07-25 DIAGNOSIS — Z20828 Contact with and (suspected) exposure to other viral communicable diseases: Secondary | ICD-10-CM | POA: Diagnosis present

## 2018-07-25 DIAGNOSIS — R042 Hemoptysis: Secondary | ICD-10-CM

## 2018-07-25 DIAGNOSIS — D638 Anemia in other chronic diseases classified elsewhere: Secondary | ICD-10-CM | POA: Diagnosis present

## 2018-07-25 DIAGNOSIS — Z8701 Personal history of pneumonia (recurrent): Secondary | ICD-10-CM | POA: Diagnosis not present

## 2018-07-25 DIAGNOSIS — Z79899 Other long term (current) drug therapy: Secondary | ICD-10-CM | POA: Diagnosis not present

## 2018-07-25 DIAGNOSIS — Z87891 Personal history of nicotine dependence: Secondary | ICD-10-CM | POA: Diagnosis not present

## 2018-07-25 DIAGNOSIS — Z7951 Long term (current) use of inhaled steroids: Secondary | ICD-10-CM | POA: Diagnosis not present

## 2018-07-25 DIAGNOSIS — D62 Acute posthemorrhagic anemia: Secondary | ICD-10-CM | POA: Diagnosis present

## 2018-07-25 DIAGNOSIS — I739 Peripheral vascular disease, unspecified: Secondary | ICD-10-CM | POA: Diagnosis present

## 2018-07-25 DIAGNOSIS — J471 Bronchiectasis with (acute) exacerbation: Secondary | ICD-10-CM | POA: Diagnosis present

## 2018-07-25 DIAGNOSIS — J850 Gangrene and necrosis of lung: Secondary | ICD-10-CM | POA: Diagnosis present

## 2018-07-25 DIAGNOSIS — E785 Hyperlipidemia, unspecified: Secondary | ICD-10-CM | POA: Diagnosis present

## 2018-07-25 DIAGNOSIS — I10 Essential (primary) hypertension: Secondary | ICD-10-CM | POA: Diagnosis present

## 2018-07-25 DIAGNOSIS — J9601 Acute respiratory failure with hypoxia: Secondary | ICD-10-CM | POA: Diagnosis not present

## 2018-07-25 DIAGNOSIS — M19042 Primary osteoarthritis, left hand: Secondary | ICD-10-CM | POA: Diagnosis present

## 2018-07-25 DIAGNOSIS — F329 Major depressive disorder, single episode, unspecified: Secondary | ICD-10-CM | POA: Diagnosis present

## 2018-07-25 DIAGNOSIS — J9621 Acute and chronic respiratory failure with hypoxia: Secondary | ICD-10-CM | POA: Diagnosis present

## 2018-07-25 DIAGNOSIS — Z8711 Personal history of peptic ulcer disease: Secondary | ICD-10-CM | POA: Diagnosis not present

## 2018-07-25 DIAGNOSIS — Z7982 Long term (current) use of aspirin: Secondary | ICD-10-CM | POA: Diagnosis not present

## 2018-07-25 DIAGNOSIS — Z79891 Long term (current) use of opiate analgesic: Secondary | ICD-10-CM | POA: Diagnosis not present

## 2018-07-25 DIAGNOSIS — Z9981 Dependence on supplemental oxygen: Secondary | ICD-10-CM | POA: Diagnosis not present

## 2018-07-25 DIAGNOSIS — M19041 Primary osteoarthritis, right hand: Secondary | ICD-10-CM | POA: Diagnosis present

## 2018-07-25 LAB — CBC
HCT: 28.1 % — ABNORMAL LOW (ref 39.0–52.0)
HCT: 28.8 % — ABNORMAL LOW (ref 39.0–52.0)
Hemoglobin: 8.2 g/dL — ABNORMAL LOW (ref 13.0–17.0)
Hemoglobin: 8.5 g/dL — ABNORMAL LOW (ref 13.0–17.0)
MCH: 26.6 pg (ref 26.0–34.0)
MCH: 26.4 pg (ref 26.0–34.0)
MCHC: 29.2 g/dL — ABNORMAL LOW (ref 30.0–36.0)
MCHC: 29.5 g/dL — ABNORMAL LOW (ref 30.0–36.0)
MCV: 91.2 fL (ref 80.0–100.0)
MCV: 89.4 fL (ref 80.0–100.0)
Platelets: 336 10*3/uL (ref 150–400)
Platelets: 353 10*3/uL (ref 150–400)
RBC: 3.08 MIL/uL — ABNORMAL LOW (ref 4.22–5.81)
RBC: 3.22 MIL/uL — ABNORMAL LOW (ref 4.22–5.81)
RDW: 15.2 % (ref 11.5–15.5)
RDW: 15.2 % (ref 11.5–15.5)
WBC: 9 10*3/uL (ref 4.0–10.5)
WBC: 7.5 10*3/uL (ref 4.0–10.5)
nRBC: 0 % (ref 0.0–0.2)
nRBC: 0 % (ref 0.0–0.2)

## 2018-07-25 MED ORDER — BENZONATATE 100 MG PO CAPS: 100.0000 mg | ORAL_CAPSULE | Freq: Two times a day (BID) | ORAL | Status: DC | PRN

## 2018-07-25 NOTE — Progress Notes (Signed)
NAME:  William Kirk, MRN:  [id number], DOB:  12-03-1940, LOS: 0 ADMISSION DATE:  07/24/2018, CONSULTATION DATE:  6/16 REFERRING MD:  Dr. Rubin Payor EDP, CHIEF COMPLAINT:  SOB, Hemoptysis    Brief History   78 year old male with COPD and bronchiectasis. Recently admitted with CAP, hemoptysis and newly discharged on 3L [place] (up from 2L [place]). Now presenting with worsening hemoptysis.  History of present illness   78 year old male with PMH as below, which is significant for COPD on home O2, bronchiectasis, and PVD. He is a former patient of Dr. Sherene Sires and is followed at the [facility] by Dr. Shelle Iron. He has been managed with TIW Azithromycin and his course has been complicated by multiple pneumonias over the years including resistant bugs. He has required nocturnal O2 at home. He was recently admitted to [facility] 5/30-6/3 for presumed CAP, bronchiectasis flare, and hemoptysis. He required intubation for the initial 24 hours. He was treated with 2 days of ABX, which were discontinued once cultures were negative. Hemoptysis was felt to be secondary to bronchiectasis flare. This improved with conservative management. CT did not demonstrate focus and no bronchoscopy was done. He was discharged on 6/3.   6/16 he again presented to [facility] with complaints of hemoptysis. This has been progressive for several days and became much worse on 6/16 with continuous episodes and coughing up "pieces of meat" appearance. On Aspirin and pletal.   Past Medical History   has a past medical history of Arthritis, Bleeding stomach ulcer (2000s), COPD (chronic obstructive pulmonary disease) ([facility]), Depression, Pneumonia, and PVD (peripheral vascular disease) ([facility]) (2014).  Significant Hospital Events   5/30-6/3 admitted with hemoptysis requiring vent for 24 hours.  6/16 admit with hemoptysis.   Consults:  PCCM  Procedures:    Significant Diagnostic Tests:    Micro Data:  COVID- CEPHEID 6/16 >  Antimicrobials:   Azithromycin 6/16 > Tobramycin(inhaled) 6/16 >  Interim history/subjective:   Improved hemoptysis  HDS No acute distress  Objective   Blood pressure (!) 125/58, pulse 63, temperature (!) 97.5 F (36.4 C), temperature source Oral, resp. rate 17, height 5\' 10"  (1.778 m), weight 72.6 kg, SpO2 99 %.        Intake/Output Summary (Last 24 hours) at 07/25/2018 1103 Last data filed at 07/24/2018 2249 Gross per 24 hour  Intake 249.73 ml  Output -  Net 249.73 ml   Filed Weights   07/24/18 1642  Weight: 72.6 kg    Examination: General: Frail elderly adult M, NAD supine on stretcher HENT: NCAT. Scant dried blood around mouth. Patent nares with [place] in place. Trachea midline  Lungs: Course crackles bilaterally. Symemtrical chest expansion. No accessory muscle recruitment  Cardiovascular: RRR s1s2 no rgm Capillary refill < 3 seconds   Abdomen: Soft round ndnt. + bowel sounds  Extremities: Symmetrical bulk and tone. No obvious deformity. No clubbing, no cyanosis  Neuro: AAOx4. PERRL. Following commands  Resolved Hospital Problem list     Assessment & Plan:   Acute on chronic hypoxemic respiratory failure: bronchiectasis flare vs PNA. He has home O2 3LNC -Seen by Dr. Shelle Iron at [facility] P - titrate supplemental O2 for SpO2 88-92% - Continue azithromycin and inhaled tobra  - BDs - Hospitalist planning to consult CT surgery regarding lung volume reduction surgery  Hemoptysis, improved  -Patient states hemoptysis occurs after "coughing jags." P - Continue to follow CBC - ASA and pletal held - Continue to monitor for new hemoptysis  -  PRN BID tessalon for cough.   COPD without acute exacerbation - BDs - Defer systemic steroids   Rest Per Primary   Best practice:  Diet: Heart Pain/Anxiety/Delirium protocol (if indicated): Norco VAP protocol (if indicated): N/a DVT prophylaxis: Per primary GI prophylaxis: Per primary Glucose control: N/a Mobility: Per primary Code Status:  FULL Family Communication: Patient updated 6/17 Disposition: Progressive   Labs   CBC: Recent Labs  Lab 07/24/18 1800 07/24/18 2135 07/25/18 0233 07/25/18 0520  WBC 13.8* 12.7* 9.0 7.5  NEUTROABS 11.3*  --   --   --   HGB 8.8* 8.8* 8.2* 8.5*  HCT 30.0* 29.7* 28.1* 28.8*  MCV 90.6 89.7 91.2 89.4  PLT 356 368 336 353    Basic Metabolic Panel: Recent Labs  Lab 07/24/18 1800  NA 137  K 4.4  CL 101  CO2 28  GLUCOSE 100*  BUN 17  CREATININE 0.98  CALCIUM 8.8*   GFR: Estimated Creatinine Clearance: 65.9 mL/min (by C-G formula based on SCr of 0.98 mg/dL). Recent Labs  Lab 07/24/18 1800 07/24/18 2135 07/25/18 0233 07/25/18 0520  WBC 13.8* 12.7* 9.0 7.5    Liver Function Tests: Recent Labs  Lab 07/24/18 1800  AST 19  ALT 16  ALKPHOS 75  BILITOT 0.5  PROT 7.0  ALBUMIN 2.8*   No results for input(s): LIPASE, AMYLASE in the last 168 hours. No results for input(s): AMMONIA in the last 168 hours.  ABG    Component Value Date/Time   PHART 7.227 (L) 07/07/2018 1622   PCO2ART 51.0 (H) 07/07/2018 1622   PO2ART 226.0 (H) 07/07/2018 1622   HCO3 21.3 07/07/2018 1622   TCO2 23 07/07/2018 1622   ACIDBASEDEF 7.0 (H) 07/07/2018 1622   O2SAT 100.0 07/07/2018 1622     Coagulation Profile: Recent Labs  Lab 07/24/18 2135  INR 1.1    Cardiac Enzymes: No results for input(s): CKTOTAL, CKMB, CKMBINDEX, TROPONINI in the last 168 hours.  HbA1C: No results found for: HGBA1C  CBG: No results for input(s): GLUCAP in the last 168 hours.    Tessie Fass MSN, AGACNP-BC [place] [phone] If no answer, [phone] 07/25/2018, 11:04 AM

## 2018-07-25 NOTE — Progress Notes (Signed)
PROGRESS NOTE    [name]  WUJ:[id number] DOB: [birth year] DOA: 07/24/2018 PCP: [facility]    Brief Narrative:  William Kirk is a 78 y.o. male with medical history significant of COPD, bronchiectasis, PVD, arthritis, depression, hypertension presenting to the hospital for evaluation of cough and hemoptysis.  Patient was recently intubated and admitted to the ICU on Jul 07, 2018 for presumed CAP, bronchiectasis flare, and hemoptysis.  He required intubation for the initial 24 hours.  He was treated with 2 days of antibiotics which were discontinued once cultures were negative.  Hemoptysis was thought to be secondary to bronchiectasis flare.  CT did not demonstrate a focus and no bronchoscopy was done.  He was discharged on June 3.  Patient states he was doing okay since he left the hospital but then started coughing up blood again today.   Pt currently on 3 lit of [place] oxygen and PCCM consulted for further recommendations.   Assessment & Plan:   Principal Problem:   Hemoptysis Active Problems:   Acute respiratory failure with hypoxia (HCC)   Bronchiectasis (HCC)   Depression   Chronic obstructive pulmonary disease (HCC)   Hemoptysis in the setting of bronchiectasis Mild acute on chronic respiratory failure with hypoxia Currently on 3 L of nasal cannula oxygen and reports no hemoptysis since morning. CT of the chest reviewed COVID-19 rapid test negative PCCM consulted and recommendations given. Resume IV azithromycin and inhaled tobramycin. Resume bronchodilators.    Peripheral arterial disease holding aspirin and Pletal for hemoptysis.   COPD No wheezing heard continue with bronchodilators.   Essential hypertension Blood pressure parameters are good   DVT prophylaxis: SCDs Code Status: Full code Family Communication: None at bedside  disposition Plan: Pending clinical improvement   Consultants:   PCCM  Procedures: None Antimicrobials:  Azithromycin and inhaled tobramycin  Subjective: Hemodialysis have stabilized.  Objective: Vitals:   07/25/18 1100 07/25/18 1115 07/25/18 1130 07/25/18 1145  BP: 140/60 134/62 137/61 138/64  Pulse: 62 (!) 58 (!) 57 (!) 58  Resp: 18 18 16 15   Temp:      TempSrc:      SpO2: 96% 99% 99% 100%  Weight:      Height:        Intake/Output Summary (Last 24 hours) at 07/25/2018 1153 Last data filed at 07/24/2018 2249 Gross per 24 hour  Intake 249.73 ml  Output -  Net 249.73 ml   Filed Weights   07/24/18 1642  Weight: 72.6 kg    Examination:  General exam: Appears calm and comfortable on 3l it of [place] oxygen.  Respiratory system: Clear to auscultation. Respiratory effort normal. Cardiovascular system: S1 & S2 heard, RRR. No JVD,  Gastrointestinal system: Abdomen is nondistended, soft and nontender. No organomegaly or masses felt. Normal bowel sounds heard. Central nervous system: Alert and oriented. No focal neurological deficits. Extremities: Symmetric 5 x 5 power. Skin: No rashes, lesions or ulcers Psychiatry: Mood & affect appropriate.     Data Reviewed: I have personally reviewed following labs and imaging studies  CBC: Recent Labs  Lab 07/24/18 1800 07/24/18 2135 07/25/18 0233 07/25/18 0520  WBC 13.8* 12.7* 9.0 7.5  NEUTROABS 11.3*  --   --   --   HGB 8.8* 8.8* 8.2* 8.5*  HCT 30.0* 29.7* 28.1* 28.8*  MCV 90.6 89.7 91.2 89.4  PLT 356 368 336 353   Basic Metabolic Panel: Recent Labs  Lab 07/24/18 1800  NA 137  K 4.4  CL  101  CO2 28  GLUCOSE 100*  BUN 17  CREATININE 0.98  CALCIUM 8.8*   GFR: Estimated Creatinine Clearance: 65.9 mL/min (by C-G formula based on SCr of 0.98 mg/dL). Liver Function Tests: Recent Labs  Lab 07/24/18 1800  AST 19  ALT 16  ALKPHOS 75  BILITOT 0.5  PROT 7.0  ALBUMIN 2.8*   No results for input(s): LIPASE, AMYLASE in the last 168 hours. No results for input(s): AMMONIA in the last 168 hours. Coagulation Profile: Recent  Labs  Lab 07/24/18 2135  INR 1.1   Cardiac Enzymes: No results for input(s): CKTOTAL, CKMB, CKMBINDEX, TROPONINI in the last 168 hours. BNP (last 3 results) No results for input(s): PROBNP in the last 8760 hours. HbA1C: No results for input(s): HGBA1C in the last 72 hours. CBG: No results for input(s): GLUCAP in the last 168 hours. Lipid Profile: No results for input(s): CHOL, HDL, LDLCALC, TRIG, CHOLHDL, LDLDIRECT in the last 72 hours. Thyroid Function Tests: No results for input(s): TSH, T4TOTAL, FREET4, T3FREE, THYROIDAB in the last 72 hours. Anemia Panel: No results for input(s): VITAMINB12, FOLATE, FERRITIN, TIBC, IRON, RETICCTPCT in the last 72 hours. Sepsis Labs: No results for input(s): PROCALCITON, LATICACIDVEN in the last 168 hours.  Recent Results (from the past 240 hour(s))  SARS Coronavirus 2     Status: None   Collection Time: 07/24/18  7:32 PM  Result Value Ref Range Status   SARS Coronavirus 2 NOT DETECTED NOT DETECTED Final    Comment: (NOTE) SARS-CoV-2 target nucleic acids are NOT DETECTED. The SARS-CoV-2 RNA is generally detectable in upper and lower respiratory specimens during the acute phase of infection.  Negative  results do not preclude SARS-CoV-2 infection, do not rule out co-infections with other pathogens, and should not be used as the sole basis for treatment or other patient management decisions.  Negative results must be combined with clinical observations, patient history, and epidemiological information. The expected result is Not Detected. Fact Sheet for Patients: http://www.biofiredefense.com/wp-content/uploads/2020/03/BIOFIRE-COVID -19-patients.pdf Fact Sheet for Healthcare Providers: http://www.biofiredefense.com/wp-content/uploads/2020/03/BIOFIRE-COVID -19-hcp.pdf This test is not yet approved or cleared by the [place] and  has been authorized for detection and/or diagnosis of SARS-CoV-2 by FDA under an Emergency Use  Authorization (EUA).  This EUA will remain in effec t (meaning this test can be used) for the duration of  the COVID-19 declaration under Section 564(b)(1) of the Act, 21 U.S.C. section 360bbb-3(b)(1), unless the authorization is terminated or revoked sooner. Performed at [facility], [address] [place], [place] [address]          Radiology Studies: Dg Chest 2 View  Result Date: 07/24/2018 CLINICAL DATA:  Cough and hemoptysis EXAM: CHEST - 2 VIEW COMPARISON:  07/08/2018.  CT dated 07/07/2018 FINDINGS: Bronchiectasis is again noted bilaterally. Emphysematous changes are noted. There is a medial right lung base opacity which is similar to prior CT. The lungs are hyperexpanded. There is chronic pleuroparenchymal scarring bilaterally. There is blunting of the right costophrenic angle. There is no large focal area of consolidation. There is no pneumothorax. IMPRESSION: 1. Trace right-sided pleural effusion. 2. Persistent bronchiectasis bilaterally. Scattered bilateral airspace opacities are again noted, likely similar to prior CT given differences in technique. 3. Persistent opacity in the medial right lung base, likely similar to prior CT given differences in technique. Electronically Signed   By: Katherine Mantle M.D.   On: 07/24/2018 18:17        Scheduled Meds: . atorvastatin  20 mg Oral  QHS  . gabapentin  300 mg Oral QHS  . HYDROcodone-acetaminophen  1 tablet Oral QHS  . HYDROcodone-acetaminophen  2 tablet Oral Daily  . mometasone-formoterol  2 puff Inhalation BID  . multivitamin with minerals  1 tablet Oral Daily  . sertraline  200 mg Oral Daily  . tobramycin (PF)  300 mg Nebulization BID  . traZODone  50 mg Oral QHS  . umeclidinium bromide  1 puff Inhalation Daily  . vitamin B-12  1,000 mcg Oral Daily   Continuous Infusions: . azithromycin Stopped (07/24/18 2249)     LOS: 0 days    Time spent: 38 minutes.     Kathlen Mody, MD [facility] Pager  [phone]   If 7PM-7AM, please contact night-coverage [email or web address] Password [facility] 07/25/2018, 11:53 AM

## 2018-07-25 NOTE — Consult Note (Signed)
Reason for Consult:hemoptysis Referring Physician: TH, Danton Sewer MD- Pulmonology [facility]  ARSAL TAPPAN is an 78 y.o. male.  HPI: Mr. Ozment is a 78 yo man with a history of tobacco abuse (quit 5 years ago), COPD, pneumonia, PVD, PUD with bleeding and depression. He has had pneumonia several times. He was admitted on 07/07/2018 with presumed pneumonia with hemoptysis. He was treated briefly with antibiotics but they were stopped after cultures came back negative. CT showed bronchiectasis and chronic volume loss in right lower lobe, but no definite bleeding site identified. Discharged home. Presented back 6/16 with recurrent hemoptysis. Large volume per patient. Also noted "chunks of meat." Felt a little short of breath initially but that resolved before he got to the hospital.  Now denies shortness of breath. No further hemoptysis since admission.  Past Medical History:  Diagnosis Date  . Arthritis    "hands" (09/21/2016)  . Bleeding stomach ulcer 2000s  . COPD (chronic obstructive pulmonary disease) ([place])   . Depression   . Pneumonia    "now and several times before this" (09/21/2016)  . PVD (peripheral vascular disease) ([place]) 2014   prev PTCA on RLE    Past Surgical History:  Procedure Laterality Date  . JOINT REPLACEMENT    . KNEE ARTHROSCOPY Right X 1  . TOTAL HIP ARTHROPLASTY Right 2009    Family History  Problem Relation Age of Onset  . Cancer Father 92       Brain  . Dementia Mother     Social History:  reports that he quit smoking about 3 years ago. His smoking use included cigarettes. He has a 55.00 pack-year smoking history. He has never used smokeless tobacco. He reports that he does not drink alcohol or use drugs.  Allergies: No Known Allergies  Medications:  Scheduled: . atorvastatin  20 mg Oral QHS  . gabapentin  300 mg Oral QHS  . HYDROcodone-acetaminophen  1 tablet Oral QHS  . HYDROcodone-acetaminophen  2 tablet Oral Daily  .  mometasone-formoterol  2 puff Inhalation BID  . multivitamin with minerals  1 tablet Oral Daily  . sertraline  200 mg Oral Daily  . tobramycin (PF)  300 mg Nebulization BID  . traZODone  50 mg Oral QHS  . umeclidinium bromide  1 puff Inhalation Daily  . vitamin B-12  1,000 mcg Oral Daily    Results for orders placed or performed during the hospital encounter of 07/24/18 (from the past 48 hour(s))  Comprehensive metabolic panel     Status: Abnormal   Collection Time: 07/24/18  6:00 PM  Result Value Ref Range   Sodium 137 135 - 145 mmol/L   Potassium 4.4 3.5 - 5.1 mmol/L   Chloride 101 98 - 111 mmol/L   CO2 28 22 - 32 mmol/L   Glucose, Bld 100 (H) 70 - 99 mg/dL   BUN 17 8 - 23 mg/dL   Creatinine, Ser 0.98 0.61 - 1.24 mg/dL   Calcium 8.8 (L) 8.9 - 10.3 mg/dL   Total Protein 7.0 6.5 - 8.1 g/dL   Albumin 2.8 (L) 3.5 - 5.0 g/dL   AST 19 15 - 41 U/L   ALT 16 0 - 44 U/L   Alkaline Phosphatase 75 38 - 126 U/L   Total Bilirubin 0.5 0.3 - 1.2 mg/dL   GFR calc non Af Amer >60 >60 mL/min   GFR calc Af Amer >60 >60 mL/min   Anion gap 8 5 - 15    Comment: Performed  at [facility], [place] [address], [place], [place] [address]  CBC with Differential     Status: Abnormal   Collection Time: 07/24/18  6:00 PM  Result Value Ref Range   WBC 13.8 (H) 4.0 - 10.5 K/uL   RBC 3.31 (L) 4.22 - 5.81 MIL/uL   Hemoglobin 8.8 (L) 13.0 - 17.0 g/dL   HCT 30.0 (L) 39.0 - 52.0 %   MCV 90.6 80.0 - 100.0 fL   MCH 26.6 26.0 - 34.0 pg   MCHC 29.3 (L) 30.0 - 36.0 g/dL   RDW 15.2 11.5 - 15.5 %   Platelets 356 150 - 400 K/uL   nRBC 0.0 0.0 - 0.2 %   Neutrophils Relative % 83 %   Neutro Abs 11.3 (H) 1.7 - 7.7 K/uL   Lymphocytes Relative 12 %   Lymphs Abs 1.7 0.7 - 4.0 K/uL   Monocytes Relative 4 %   Monocytes Absolute 0.6 0.1 - 1.0 K/uL   Eosinophils Relative 1 %   Eosinophils Absolute 0.2 0.0 - 0.5 K/uL   Basophils Relative 0 %   Basophils Absolute 0.0 0.0 - 0.1 K/uL   Immature Granulocytes 0 %   Abs  Immature Granulocytes 0.06 0.00 - 0.07 K/uL    Comment: Performed at [facility], [address] [place], [place] [address]  SARS Coronavirus 2     Status: None   Collection Time: 07/24/18  7:32 PM  Result Value Ref Range   SARS Coronavirus 2 NOT DETECTED NOT DETECTED    Comment: (NOTE) SARS-CoV-2 target nucleic acids are NOT DETECTED. The SARS-CoV-2 RNA is generally detectable in upper and lower respiratory specimens during the acute phase of infection.  Negative  results do not preclude SARS-CoV-2 infection, do not rule out co-infections with other pathogens, and should not be used as the sole basis for treatment or other patient management decisions.  Negative results must be combined with clinical observations, patient history, and epidemiological information. The expected result is Not Detected. Fact Sheet for Patients: http://www.biofiredefense.com/wp-content/uploads/2020/03/BIOFIRE-COVID -19-patients.pdf Fact Sheet for Healthcare Providers: http://www.biofiredefense.com/wp-content/uploads/2020/03/BIOFIRE-COVID -19-hcp.pdf This test is not yet approved or cleared by the [place] and  has been authorized for detection and/or diagnosis of SARS-CoV-2 by FDA under an Emergency Use Authorization (EUA).  This EUA will remain in effec t (meaning this test can be used) for the duration of  the COVID-19 declaration under Section 564(b)(1) of the Act, 21 U.S.C. section 360bbb-3(b)(1), unless the authorization is terminated or revoked sooner. Performed at [facility], [place] [address], [place], [place] [address]   CBC     Status: Abnormal   Collection Time: 07/24/18  9:35 PM  Result Value Ref Range   WBC 12.7 (H) 4.0 - 10.5 K/uL   RBC 3.31 (L) 4.22 - 5.81 MIL/uL   Hemoglobin 8.8 (L) 13.0 - 17.0 g/dL   HCT 29.7 (L) 39.0 - 52.0 %   MCV 89.7 80.0 - 100.0 fL   MCH 26.6 26.0 - 34.0 pg   MCHC 29.6 (L) 30.0 - 36.0 g/dL   RDW 15.1 11.5 - 15.5 %   Platelets 368 150  - 400 K/uL   nRBC 0.0 0.0 - 0.2 %    Comment: Performed at [facility], [place] [address], [place], [place] [address]  Protime-INR     Status: None   Collection Time: 07/24/18  9:35 PM  Result Value Ref Range   Prothrombin Time 14.5 11.4 - 15.2 seconds  INR 1.1 0.8 - 1.2    Comment: (NOTE) INR goal varies based on device and disease states. Performed at [facility], [place] [address] [place], [place] [address]   APTT     Status: None   Collection Time: 07/24/18  9:35 PM  Result Value Ref Range   aPTT 34 24 - 36 seconds    Comment: Performed at [place] [address], [place], [place] [address]  CBC     Status: Abnormal   Collection Time: 07/25/18  2:33 AM  Result Value Ref Range   WBC 9.0 4.0 - 10.5 K/uL   RBC 3.08 (L) 4.22 - 5.81 MIL/uL   Hemoglobin 8.2 (L) 13.0 - 17.0 g/dL   HCT 28.1 (L) 39.0 - 52.0 %   MCV 91.2 80.0 - 100.0 fL   MCH 26.6 26.0 - 34.0 pg   MCHC 29.2 (L) 30.0 - 36.0 g/dL   RDW 15.2 11.5 - 15.5 %   Platelets 336 150 - 400 K/uL   nRBC 0.0 0.0 - 0.2 %    Comment: Performed at [facility], [place] [address], [place], [place] [address]  CBC     Status: Abnormal   Collection Time: 07/25/18  5:20 AM  Result Value Ref Range   WBC 7.5 4.0 - 10.5 K/uL   RBC 3.22 (L) 4.22 - 5.81 MIL/uL   Hemoglobin 8.5 (L) 13.0 - 17.0 g/dL   HCT 28.8 (L) 39.0 - 52.0 %   MCV 89.4 80.0 - 100.0 fL   MCH 26.4 26.0 - 34.0 pg   MCHC 29.5 (L) 30.0 - 36.0 g/dL   RDW 15.2 11.5 - 15.5 %   Platelets 353 150 - 400 K/uL   nRBC 0.0 0.0 - 0.2 %    Comment: Performed at [facility], [place] [address] [place],  [address]    Dg Chest 2 View  Result Date: 07/24/2018 CLINICAL DATA:  Cough and hemoptysis EXAM: CHEST - 2 VIEW COMPARISON:  07/08/2018.  CT dated 07/07/2018 FINDINGS: Bronchiectasis is again noted bilaterally. Emphysematous changes are noted. There is a medial right lung base opacity which is similar to prior CT. The lungs are hyperexpanded. There  is chronic pleuroparenchymal scarring bilaterally. There is blunting of the right costophrenic angle. There is no large focal area of consolidation. There is no pneumothorax. IMPRESSION: 1. Trace right-sided pleural effusion. 2. Persistent bronchiectasis bilaterally. Scattered bilateral airspace opacities are again noted, likely similar to prior CT given differences in technique. 3. Persistent opacity in the medial right lung base, likely similar to prior CT given differences in technique. Electronically Signed   By: Constance Holster M.D.   On: 07/24/2018 18:17   CT chest 07/07/2018 CT ANGIOGRAPHY CHEST WITH CONTRAST  TECHNIQUE: Multidetector CT imaging of the chest was performed using the standard protocol during bolus administration of intravenous contrast. Multiplanar CT image reconstructions and MIPs were obtained to evaluate the vascular anatomy.  CONTRAST:  177m OMNIPAQUE IOHEXOL 350 MG/ML SOLN  COMPARISON:  09/21/2016  FINDINGS: Cardiovascular: Normal heart size. No pericardial effusion. Negative for pulmonary artery filling defect. There is shunting away from the collapsed right lower lobe. Aortic and coronary atherosclerosis.  Mediastinum/Nodes: Endotracheal tube in good position with tip between the clavicular heads and carina. The orogastric tube tip is at the stomach.  Mediastinal adenopathy. Subcarinal node measures 19 mm, similar to prior. Stable milder enlargement of bilateral hilar nodes. These nodes are likely reactive  Lungs/Pleura: Continued collapse  of the right lower lobe with debris or stricture of apical and posterior basilar segments. 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 Reticulonodular opacity throughout bilateral lungs is improved, with decreasing nodular areas.  Upper Abdomen: No acute finding  Musculoskeletal: No acute or aggressive finding.  Spondylosis.  Review of the MIP images confirms the above  findings.  IMPRESSION: Negative for pulmonary embolism.  1. History of hemoptysis with cylindrical bronchiectasis and airway narrowing in the right lower lobe has developed since 09/21/2016 chest CT. The affected segments are chronically collapsed. 2. 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   Electronically Signed   By: Monte Fantasia M.D.   On: 07/07/2018 14:59 I personally reviewed the CT images and concur with the findings noted above  Review of Systems  Constitutional: Negative for chills and fever.  Respiratory: Positive for cough, hemoptysis and shortness of breath.   Cardiovascular: Negative for chest pain.  Musculoskeletal: Positive for back pain and joint pain.  Psychiatric/Behavioral: Positive for depression.   Blood pressure 138/65, pulse 60, temperature (!) 97.3 F (36.3 C), temperature source Oral, resp. rate 19, height _0  (1.778 m), weight 66.5 kg, SpO2 97 %. Physical Exam  Vitals reviewed. Constitutional: He is oriented to person, place, and time. No distress.  Elderly  HENT:  Head: Normocephalic and atraumatic.  Wearing surgical mask  Eyes: Conjunctivae and EOM are normal. No scleral icterus.  Neck: Neck supple.  Cardiovascular: Normal rate, regular rhythm and normal heart sounds.  Respiratory: Effort normal. No respiratory distress. He has no wheezes.  Faint rhonchi bilaterally  GI: Soft. He exhibits no distension. There is no abdominal tenderness.  Lymphadenopathy:    He has no cervical adenopathy.  Neurological: He is alert and oriented to person, place, and time. No cranial nerve deficit. Coordination normal.  Skin: Skin is warm and dry.    Assessment/Plan: Mr. 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 Presents with second episode of significant hemoptysis in last 3 weeks. CT was not definitive but RLL bronchiectasis is likely  culprit. Not bleeding currently. At some point will need bronchoscopy- will defer to Pulmonary.  Treatment of hemoptysis should it recur would best be accomplished with bronchial artery embolization by IR.  The question of lung volume reduction surgery was raised in the notes. There is no role for that in his care.   Melrose Nakayama 07/25/2018, 4:01 PM

## 2018-07-25 NOTE — ED Notes (Signed)
Spoke with patient and then called wife regarding plan of car and now has a bed assignment and will go to room shortly.

## 2018-07-25 NOTE — ED Notes (Signed)
Pt on the phone with family updating them on his care.

## 2018-07-25 NOTE — ED Notes (Signed)
ED TO INPATIENT HANDOFF REPORT  ED Nurse Name and Phone #: Kenley Rettinger [phone]  S Name/Age/Gender William Kirk 78 y.o. male Room/Bed: 024C/024C  Code Status   Code Status: Full Code  Home/SNF/Other Home Patient oriented to: self, place, time and situation Is this baseline? Yes   Triage Complete: Triage complete  Chief Complaint Coughing up blood  Triage Note Pt brought in by [facility], Pt reports coughing up blood for several weeks. Pt reports being seen here and admitted recently. Pt was on a medication(unsure) and it was helping until a few days ago. Pt denies CP/SHOB, dizziness, or weakness. Pt typically wears 2 L [place] sats 86%, EMS increased to 4L [place] sats were 93-94%.    Allergies No Known Allergies  Level of Care/Admitting Diagnosis ED Disposition    ED Disposition Condition Comment   Admit  Hospital Area: [facility] [100100]  Level of Care: Progressive [102]  I expect the patient will be discharged within 24 hours: No (not a candidate for 5C-Observation unit)  Covid Evaluation: Person Under Investigation (PUI)  Isolation Risk Level: High Risk/Airborne (Aerosolizing procedure, nebulizer, intubated/ventilation, CPAP/BiPAP)  Diagnosis: Acute respiratory failure with hypoxia [facility]) [885027]  Admitting Physician: Shela Leff [[id number]]  Attending Physician: Shela Leff [[id number]]  PT Class (Do Not Modify): Observation [104]  PT Acc Code (Do Not Modify): Observation [10022]       B Medical/Surgery History Past Medical History:  Diagnosis Date  . Arthritis    "hands" (09/21/2016)  . Bleeding stomach ulcer 2000s  . COPD (chronic obstructive pulmonary disease) ([place])   . Depression   . Pneumonia    "now and several times before this" (09/21/2016)  . PVD (peripheral vascular disease) ([place]) 2014   prev PTCA on RLE   Past Surgical History:  Procedure Laterality Date  . JOINT REPLACEMENT    . KNEE ARTHROSCOPY Right X 1  . TOTAL HIP  ARTHROPLASTY Right 2009     A IV Location/Drains/Wounds Patient Lines/Drains/Airways Status   Active Line/Drains/Airways    Name:   Placement date:   Placement time:   Site:   Days:   Peripheral IV 07/24/18 Right Antecubital   07/24/18    1933    Antecubital   1          Intake/Output Last 24 hours  Intake/Output Summary (Last 24 hours) at 07/25/2018 1238 Last data filed at 07/24/2018 2249 Gross per 24 hour  Intake 249.73 ml  Output -  Net 249.73 ml    Labs/Imaging Results for orders placed or performed during the hospital encounter of 07/24/18 (from the past 48 hour(s))  Comprehensive metabolic panel     Status: Abnormal   Collection Time: 07/24/18  6:00 PM  Result Value Ref Range   Sodium 137 135 - 145 mmol/L   Potassium 4.4 3.5 - 5.1 mmol/L   Chloride 101 98 - 111 mmol/L   CO2 28 22 - 32 mmol/L   Glucose, Bld 100 (H) 70 - 99 mg/dL   BUN 17 8 - 23 mg/dL   Creatinine, Ser 0.98 0.61 - 1.24 mg/dL   Calcium 8.8 (L) 8.9 - 10.3 mg/dL   Total Protein 7.0 6.5 - 8.1 g/dL   Albumin 2.8 (L) 3.5 - 5.0 g/dL   AST 19 15 - 41 U/L   ALT 16 0 - 44 U/L   Alkaline Phosphatase 75 38 - 126 U/L   Total Bilirubin 0.5 0.3 - 1.2 mg/dL   GFR calc non Af Amer >60 >  60 mL/min   GFR calc Af Amer >60 >60 mL/min   Anion gap 8 5 - 15    Comment: Performed at [facility] [address] [place], [place] [address]  CBC with Differential     Status: Abnormal   Collection Time: 07/24/18  6:00 PM  Result Value Ref Range   WBC 13.8 (H) 4.0 - 10.5 K/uL   RBC 3.31 (L) 4.22 - 5.81 MIL/uL   Hemoglobin 8.8 (L) 13.0 - 17.0 g/dL   HCT 30.0 (L) 39.0 - 52.0 %   MCV 90.6 80.0 - 100.0 fL   MCH 26.6 26.0 - 34.0 pg   MCHC 29.3 (L) 30.0 - 36.0 g/dL   RDW 15.2 11.5 - 15.5 %   Platelets 356 150 - 400 K/uL   nRBC 0.0 0.0 - 0.2 %   Neutrophils Relative % 83 %   Neutro Abs 11.3 (H) 1.7 - 7.7 K/uL   Lymphocytes Relative 12 %   Lymphs Abs 1.7 0.7 - 4.0 K/uL   Monocytes Relative 4 %   Monocytes Absolute  0.6 0.1 - 1.0 K/uL   Eosinophils Relative 1 %   Eosinophils Absolute 0.2 0.0 - 0.5 K/uL   Basophils Relative 0 %   Basophils Absolute 0.0 0.0 - 0.1 K/uL   Immature Granulocytes 0 %   Abs Immature Granulocytes 0.06 0.00 - 0.07 K/uL    Comment: Performed at [facility], [address] [place], [place] [address]  SARS Coronavirus 2     Status: None   Collection Time: 07/24/18  7:32 PM  Result Value Ref Range   SARS Coronavirus 2 NOT DETECTED NOT DETECTED    Comment: (NOTE) SARS-CoV-2 target nucleic acids are NOT DETECTED. The SARS-CoV-2 RNA is generally detectable in upper and lower respiratory specimens during the acute phase of infection.  Negative  results do not preclude SARS-CoV-2 infection, do not rule out co-infections with other pathogens, and should not be used as the sole basis for treatment or other patient management decisions.  Negative results must be combined with clinical observations, patient history, and epidemiological information. The expected result is Not Detected. Fact Sheet for Patients: http://www.biofiredefense.com/wp-content/uploads/2020/03/BIOFIRE-COVID -19-patients.pdf Fact Sheet for Healthcare Providers: http://www.biofiredefense.com/wp-content/uploads/2020/03/BIOFIRE-COVID -19-hcp.pdf This test is not yet approved or cleared by the [place] and  has been authorized for detection and/or diagnosis of SARS-CoV-2 by FDA under an Emergency Use Authorization (EUA).  This EUA will remain in effec t (meaning this test can be used) for the duration of  the COVID-19 declaration under Section 564(b)(1) of the Act, 21 U.S.C. section 360bbb-3(b)(1), unless the authorization is terminated or revoked sooner. Performed at [facility], [place] [address], [place], [place] [address]   CBC     Status: Abnormal   Collection Time: 07/24/18  9:35 PM  Result Value Ref Range   WBC 12.7 (H) 4.0 - 10.5 K/uL   RBC 3.31 (L) 4.22 - 5.81 MIL/uL    Hemoglobin 8.8 (L) 13.0 - 17.0 g/dL   HCT 29.7 (L) 39.0 - 52.0 %   MCV 89.7 80.0 - 100.0 fL   MCH 26.6 26.0 - 34.0 pg   MCHC 29.6 (L) 30.0 - 36.0 g/dL   RDW 15.1 11.5 - 15.5 %   Platelets 368 150 - 400 K/uL   nRBC 0.0 0.0 - 0.2 %    Comment: Performed at [facility], [place] [address], [place], [place] [address]  Protime-INR     Status:  None   Collection Time: 07/24/18  9:35 PM  Result Value Ref Range   Prothrombin Time 14.5 11.4 - 15.2 seconds   INR 1.1 0.8 - 1.2    Comment: (NOTE) INR goal varies based on device and disease states. Performed at [facility], [place] [address], [place], [place] [address]   APTT     Status: None   Collection Time: 07/24/18  9:35 PM  Result Value Ref Range   aPTT 34 24 - 36 seconds    Comment: Performed at [facility] [address] [place], [place] [address]  CBC     Status: Abnormal   Collection Time: 07/25/18  2:33 AM  Result Value Ref Range   WBC 9.0 4.0 - 10.5 K/uL   RBC 3.08 (L) 4.22 - 5.81 MIL/uL   Hemoglobin 8.2 (L) 13.0 - 17.0 g/dL   HCT 28.1 (L) 39.0 - 52.0 %   MCV 91.2 80.0 - 100.0 fL   MCH 26.6 26.0 - 34.0 pg   MCHC 29.2 (L) 30.0 - 36.0 g/dL   RDW 15.2 11.5 - 15.5 %   Platelets 336 150 - 400 K/uL   nRBC 0.0 0.0 - 0.2 %    Comment: Performed at [facility], [place] [address], [place], [place] [address]  CBC     Status: Abnormal   Collection Time: 07/25/18  5:20 AM  Result Value Ref Range   WBC 7.5 4.0 - 10.5 K/uL   RBC 3.22 (L) 4.22 - 5.81 MIL/uL   Hemoglobin 8.5 (L) 13.0 - 17.0 g/dL   HCT 28.8 (L) 39.0 - 52.0 %   MCV 89.4 80.0 - 100.0 fL   MCH 26.4 26.0 - 34.0 pg   MCHC 29.5 (L) 30.0 - 36.0 g/dL   RDW 15.2 11.5 - 15.5 %   Platelets 353 150 - 400 K/uL   nRBC 0.0 0.0 - 0.2 %    Comment: Performed at [facility], [place] [address] [place], [place] [address]   Dg Chest 2 View  Result Date: 07/24/2018 CLINICAL DATA:  Cough and hemoptysis EXAM: CHEST - 2 VIEW COMPARISON:  07/08/2018.  CT dated  07/07/2018 FINDINGS: Bronchiectasis is again noted bilaterally. Emphysematous changes are noted. There is a medial right lung base opacity which is similar to prior CT. The lungs are hyperexpanded. There is chronic pleuroparenchymal scarring bilaterally. There is blunting of the right costophrenic angle. There is no large focal area of consolidation. There is no pneumothorax. IMPRESSION: 1. Trace right-sided pleural effusion. 2. Persistent bronchiectasis bilaterally. Scattered bilateral airspace opacities are again noted, likely similar to prior CT given differences in technique. 3. Persistent opacity in the medial right lung base, likely similar to prior CT given differences in technique. Electronically Signed   By: Constance Holster M.D.   On: 07/24/2018 18:17    Pending Labs Unresulted Labs (From admission, onward)    Start     Ordered   07/26/18 0500  CBC  Tomorrow morning,   R     07/25/18 1154   07/26/18 6294  Basic metabolic panel  Tomorrow morning,   R     07/25/18 1154          Vitals/Pain Today's Vitals   07/25/18 1100 07/25/18 1115 07/25/18 1130 07/25/18 1145  BP: 140/60 134/62 137/61 138/64  Pulse: 62 (!) 58 (!) 57 (!) 58  Resp: 18 18 16 15   Temp:      TempSrc:  SpO2: 96% 99% 99% 100%  Weight:      Height:      PainSc:        Isolation Precautions No active isolations  Medications Medications  azithromycin (ZITHROMAX) 500 mg in sodium chloride 0.9 % 250 mL IVPB (0 mg Intravenous Stopped 07/24/18 2249)  albuterol (VENTOLIN HFA) 108 (90 Base) MCG/ACT inhaler 2 puff (has no administration in time range)  atorvastatin (LIPITOR) tablet 20 mg (20 mg Oral Not Given 07/25/18 0511)  umeclidinium bromide (INCRUSE ELLIPTA) 62.5 MCG/INH 1 puff (1 puff Inhalation Not Given 07/25/18 1025)  tobramycin (PF) (TOBI) nebulizer solution 300 mg (300 mg Nebulization Not Given 07/25/18 0736)  HYDROcodone-acetaminophen (NORCO) 10-325 MG per tablet 2 tablet (2 tablets Oral Given 07/25/18  1024)  sertraline (ZOLOFT) tablet 200 mg (200 mg Oral Given 07/25/18 1024)  traZODone (DESYREL) tablet 50 mg (50 mg Oral Not Given 07/25/18 0512)  vitamin B-12 (CYANOCOBALAMIN) tablet 1,000 mcg (1,000 mcg Oral Given 07/25/18 1024)  gabapentin (NEURONTIN) capsule 300 mg (300 mg Oral Not Given 07/25/18 1601)  multivitamin with minerals tablet 1 tablet (1 tablet Oral Given 07/25/18 1023)  protein supplement (ENSURE MAX) liquid (has no administration in time range)  mometasone-formoterol (DULERA) 200-5 MCG/ACT inhaler 2 puff (2 puffs Inhalation Not Given 07/25/18 0736)  HYDROcodone-acetaminophen (NORCO) 10-325 MG per tablet 1 tablet (1 tablet Oral Not Given 07/25/18 0512)  benzonatate (TESSALON) capsule 100 mg (has no administration in time range)    Mobility walks Low fall risk   Focused Assessments Pulmonary Assessment Handoff:  Lung sounds: Bilateral Breath Sounds: Inspiratory wheezes O2 Device: Nasal Cannula O2 Flow Rate (L/min): 4 L/min      R Recommendations: See Admitting Provider Note  Report given to:   Additional Notes:

## 2018-07-26 ENCOUNTER — Inpatient Hospital Stay (HOSPITAL_COMMUNITY): Payer: Medicare HMO

## 2018-07-26 ENCOUNTER — Encounter (HOSPITAL_COMMUNITY): Payer: Self-pay | Admitting: Radiology

## 2018-07-26 DIAGNOSIS — J9601 Acute respiratory failure with hypoxia: Secondary | ICD-10-CM

## 2018-07-26 HISTORY — PX: IR US GUIDE VASC ACCESS RIGHT: IMG2390

## 2018-07-26 HISTORY — PX: IR ANGIOGRAM SELECTIVE EACH ADDITIONAL VESSEL: IMG667

## 2018-07-26 HISTORY — PX: IR EMBO ART  VEN HEMORR LYMPH EXTRAV  INC GUIDE ROADMAPPING: IMG5450

## 2018-07-26 LAB — CBC
HCT: 30.1 % — ABNORMAL LOW (ref 39.0–52.0)
Hemoglobin: 9 g/dL — ABNORMAL LOW (ref 13.0–17.0)
MCH: 26.6 pg (ref 26.0–34.0)
MCHC: 29.9 g/dL — ABNORMAL LOW (ref 30.0–36.0)
MCV: 89.1 fL (ref 80.0–100.0)
Platelets: 352 10*3/uL (ref 150–400)
RBC: 3.38 MIL/uL — ABNORMAL LOW (ref 4.22–5.81)
RDW: 15.3 % (ref 11.5–15.5)
WBC: 7.2 10*3/uL (ref 4.0–10.5)
nRBC: 0 % (ref 0.0–0.2)

## 2018-07-26 LAB — BASIC METABOLIC PANEL
Anion gap: 7 (ref 5–15)
BUN: 12 mg/dL (ref 8–23)
CO2: 30 mmol/L (ref 22–32)
Calcium: 9 mg/dL (ref 8.9–10.3)
Chloride: 104 mmol/L (ref 98–111)
Creatinine, Ser: 0.99 mg/dL (ref 0.61–1.24)
GFR calc Af Amer: >60 mL/min (ref >60)
GFR calc non Af Amer: >60 mL/min (ref >60)
Glucose, Bld: 83 mg/dL (ref 70–99)
Potassium: 4.1 mmol/L (ref 3.5–5.1)
Sodium: 141 mmol/L (ref 135–145)

## 2018-07-26 MED ORDER — LIDOCAINE HCL 1 % IJ SOLN
INTRAMUSCULAR | Status: AC
  Filled 2018-07-26: qty 20

## 2018-07-26 MED ORDER — MIDAZOLAM HCL 2 MG/2ML IJ SOLN
INTRAMUSCULAR | Status: AC
  Filled 2018-07-26: qty 4

## 2018-07-26 MED ORDER — IOHEXOL 300 MG/ML  SOLN
150.0000 mL | Freq: Once | INTRAMUSCULAR | Status: AC | PRN
  Administered 2018-07-26: 1 mL via INTRAVENOUS

## 2018-07-26 MED ORDER — IOHEXOL 300 MG/ML  SOLN
150.0000 mL | Freq: Once | INTRAMUSCULAR | Status: AC | PRN
  Administered 2018-07-26: 150 mL via INTRAVENOUS

## 2018-07-26 MED ORDER — MIDAZOLAM HCL 2 MG/2ML IJ SOLN
INTRAMUSCULAR | Status: AC | PRN
  Administered 2018-07-26 (×3): 1 mg via INTRAVENOUS

## 2018-07-26 MED ORDER — IOHEXOL 350 MG/ML SOLN
100.0000 mL | Freq: Once | INTRAVENOUS | Status: AC | PRN
  Administered 2018-07-26: 100 mL via INTRAVENOUS

## 2018-07-26 MED ORDER — SODIUM CHLORIDE 0.9 % IV SOLN
INTRAVENOUS | Status: AC | PRN
  Administered 2018-07-26: 10 mL/h via INTRAVENOUS

## 2018-07-26 MED ORDER — LIDOCAINE HCL 1 % IJ SOLN
INTRAMUSCULAR | Status: AC | PRN
  Administered 2018-07-26: 15 mL

## 2018-07-26 MED ORDER — SODIUM CHLORIDE 0.9 % IV SOLN
INTRAVENOUS | Status: DC
  Administered 2018-07-26 – 2018-07-28 (×3): via INTRAVENOUS

## 2018-07-26 MED ORDER — FENTANYL CITRATE (PF) 100 MCG/2ML IJ SOLN
INTRAMUSCULAR | Status: AC
  Filled 2018-07-26: qty 2

## 2018-07-26 MED ORDER — FENTANYL CITRATE (PF) 100 MCG/2ML IJ SOLN
INTRAMUSCULAR | Status: AC | PRN
  Administered 2018-07-26: 25 ug via INTRAVENOUS

## 2018-07-26 NOTE — Procedures (Signed)
Interventional Radiology Procedure Note  Procedure: US guided right CFA access - closure with Exoseal device  Thoracic segmental artery angiogram/bronchial artery angio, with embolization of right bronchial arteries.   .  Complications: None EBL: none  Recommendations:  - right hip straight times 4 hours - Do not submerge for 7 days - Routine wound care  - gentle hydration  Signed,  Dulcy Fanny. Earleen Newport, DO

## 2018-07-26 NOTE — Progress Notes (Signed)
Spoke with wife after prior call wanting to know if patient was going for any procedures and any significant status changes. Left note in chart per wife request to be update by Medical team on pt status and testing. Wife updated from nursing perspectives.

## 2018-07-26 NOTE — Progress Notes (Signed)
Patient ID: William Kirk, male   DOB: 01/19/1941, 78 y.o.   MRN: [id number]   Risks and benefits of Pulmonary arteriogram with possible bronchial artery embolization were discussed with the patient including, but not limited to bleeding, infection, vascular injury or contrast induced renal failure.  This interventional procedure involves the use of X-rays and because of the nature of the planned procedure, it is possible that we will have prolonged use of X-ray fluoroscopy.  Potential radiation risks to you include (but are not limited to) the following: - A slightly elevated risk for cancer  several years later in life. This risk is typically less than 0.5% percent. This risk is low in comparison to the normal incidence of human cancer, which is 33% for women and 50% for men according to the [place]. - Radiation induced injury can include skin redness, resembling a rash, tissue breakdown / ulcers and hair loss (which can be temporary or permanent).   The likelihood of either of these occurring depends on the difficulty of the procedure and whether you are sensitive to radiation due to previous procedures, disease, or genetic conditions.   IF your procedure requires a prolonged use of radiation, you will be notified and given written instructions for further action.  It is your responsibility to monitor the irradiated area for the 2 weeks following the procedure and to notify your physician if you are concerned that you have suffered a radiation induced injury.    All of the patient's questions were answered, patient is agreeable to proceed.  Consent signed and in chart.

## 2018-07-26 NOTE — H&P (View-Only) (Signed)
NAME:  William Kirk, MRN:  [id number], DOB:  1940-03-15, LOS: 1 ADMISSION DATE:  07/24/2018, CONSULTATION DATE:  6/16 REFERRING MD:  Dr. Rubin Payor EDP, CHIEF COMPLAINT:  SOB, Hemoptysis    Brief History   78 year old male with COPD and bronchiectasis. Recently admitted 5/30-6/3 with CAP, hemoptysis and newly discharged on 3L [place] (up from 2L PRN). Admitted 6/16 with several days of hemoptysis that increased on 6/16 prompting evaluation. He is on ASA + Pletal for PVD.    Past Medical History   has a past medical history of Arthritis, Bleeding stomach ulcer (2000s), COPD (chronic obstructive pulmonary disease) ([facility]), Depression, Pneumonia, and PVD (peripheral vascular disease) ([facility]) (2014).  Significant Hospital Events   5/30-6/3 admitted with hemoptysis requiring vent for 24 hours.  6/16 admit with hemoptysis.   Consults:  PCCM  Procedures:    Significant Diagnostic Tests:    Micro Data:  COVID- CEPHEID 6/16 >> negative   Antimicrobials:  Azithromycin 6/16 > Tobramycin (inhaled) 6/16 >  Interim history/subjective:  Pt reports he got up this am and had a large volume of hemoptysis, was unable to make it to the bathroom.  He estimates at least a half of a styrofoam cup but the nurse estimates it at 2 cups. Pt is hopeful to go home but willing to stay if procedures needed.   Objective   Blood pressure (!) 131/54, pulse 87, temperature 98.7 F (37.1 C), resp. rate 18, height 5\' 10"  (1.778 m), weight 66.5 kg, SpO2 (!) 84 %.        Intake/Output Summary (Last 24 hours) at 07/26/2018 1133 Last data filed at 07/26/2018 0100 Gross per 24 hour  Intake 250 ml  Output -  Net 250 ml   Filed Weights   07/24/18 1642 07/25/18 1424  Weight: 72.6 kg 66.5 kg    Examination: General: thin, elderly male lying in bed in NAD  HEENT: MM pink/moist, dentures  Neuro: AAOx4, speech clear, MAE  CV: s1s2 rrr, no m/r/g PULM: even/non-labored, lungs bilaterally clear anterior, bronchial  breath sounds at right base  WU:JWJX, non-tender, bsx4 active  Extremities: warm/dry, no edema  Skin: no rashes or lesions  Resolved Hospital Problem list     Assessment & Plan:   Acute on chronic hypoxemic respiratory failure -in setting of known bronchiectasis flare. Prior FOB's with Dr. Shelle Iron -home O2 [facility] -followed by Dr. Shelle Iron at [facility] P: Wean O2 for sats 88-94% Azithro + inhaled Tobramycin  Continue bronchodilators  No role for lung reduction surgery  IR consult recommended for evaluation for bronchial artery embolization given repeat episodes of hemoptysis  Hemoptysis P: Trend CBC  Monitor volume of hemoptysis  Hold ASA, pletal  BID PRN tessalon for cough   COPD without Acute Exacerbation P: Continue Dulera, Incruse  No role for steroids at this time    Rest Per Primary   Best practice:  Diet: Heart Pain/Anxiety/Delirium protocol (if indicated): Norco VAP protocol (if indicated): N/a DVT prophylaxis: Per primary GI prophylaxis: Per primary Glucose control: N/a Mobility: Per primary Code Status: FULL Family Communication: Patient updated on plan of care 6/18 Disposition: Progressive   Labs   CBC: Recent Labs  Lab 07/24/18 1800 07/24/18 2135 07/25/18 0233 07/25/18 0520 07/26/18 0606  WBC 13.8* 12.7* 9.0 7.5 7.2  NEUTROABS 11.3*  --   --   --   --   HGB 8.8* 8.8* 8.2* 8.5* 9.0*  HCT 30.0* 29.7* 28.1* 28.8* 30.1*  MCV 90.6 89.7 91.2 89.4  89.1  PLT 356 368 336 353 352    Basic Metabolic Panel: Recent Labs  Lab 07/24/18 1800 07/26/18 0606  NA 137 141  K 4.4 4.1  CL 101 104  CO2 28 30  GLUCOSE 100* 83  BUN 17 12  CREATININE 0.98 0.99  CALCIUM 8.8* 9.0   GFR: Estimated Creatinine Clearance: 59.7 mL/min (by C-G formula based on SCr of 0.99 mg/dL). Recent Labs  Lab 07/24/18 2135 07/25/18 0233 07/25/18 0520 07/26/18 0606  WBC 12.7* 9.0 7.5 7.2    Liver Function Tests: Recent Labs  Lab 07/24/18 1800  AST 19  ALT 16  ALKPHOS 75   BILITOT 0.5  PROT 7.0  ALBUMIN 2.8*   No results for input(s): LIPASE, AMYLASE in the last 168 hours. No results for input(s): AMMONIA in the last 168 hours.  ABG    Component Value Date/Time   PHART 7.227 (L) 07/07/2018 1622   PCO2ART 51.0 (H) 07/07/2018 1622   PO2ART 226.0 (H) 07/07/2018 1622   HCO3 21.3 07/07/2018 1622   TCO2 23 07/07/2018 1622   ACIDBASEDEF 7.0 (H) 07/07/2018 1622   O2SAT 100.0 07/07/2018 1622     Coagulation Profile: Recent Labs  Lab 07/24/18 2135  INR 1.1    Cardiac Enzymes: No results for input(s): CKTOTAL, CKMB, CKMBINDEX, TROPONINI in the last 168 hours.  HbA1C: No results found for: HGBA1C  CBG: No results for input(s): GLUCAP in the last 168 hours.  Canary Brim, NP-C [facility] Pgr: [phone] or if no answer [phone] 07/26/2018, 11:34 AM

## 2018-07-26 NOTE — Consult Note (Signed)
Chief Complaint: Patient was seen in consultation today for evaluation for possible bronchial artery e,bolization Chief Complaint  Patient presents with  . Cough    hemoptysis   at the request of Dr Karleen Hampshire  Referring Physician(s): Dr Leonarda Salon  Supervising Physician: Corrie Mckusick  Patient Status: [facility] - In-pt  History of Present Illness: William Kirk is a 78 y.o. male   Previous long time smoker Quit 3-4 yrs ago Known COPD Several episodes of Pneumonia + bronchiectasis Followed by Dr Gwenette Greet LD Pletal and ASA 6/16  Hemoptysis x 3-4 days-- came to ED 617 Bringing up red blood several times a day--- sometimes even thick and chunky Even today had episode of hemoptysis into cup and into toilet-- red blood Maybe 1/2 cup or more  Recent treatment for another bout of PNA 5/30-6/3- hospitalization Has had continued hemoptysis every 4-5 days-- but resolved on own until 6/16-- with more frequent worsening symptoms  CTA 5/30:  1. History of hemoptysis with cylindrical bronchiectasis and airway narrowing in the right lower lobe has developed since 09/21/2016 chest CT. The affected segments are chronically collapsed. 2. 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  Dr Roxan Hockey note yesterday:  Assessment/Plan: William Kirk is a 78 yo former smoker with COPD and bronchiectasis due to multiple previous pneumonias. Presents with second episode of significant hemoptysis in last 3 weeks. CT was not definitive but RLL bronchiectasis is likely culprit. Not bleeding currently. At some point will need bronchoscopy- will defer to Pulmonary. Treatment of hemoptysis should it recur would best be accomplished with bronchial artery embolization by IR. The question of lung volume reduction surgery was raised in the notes. There is no role for that in his care.   Request for evaluation and consideration of Bronchial artery  embolization Reviewed with Dr Earleen Newport CTA Chest-- with arterial phase ordered  I have spoken to pt regarding embolization If deemed necessary and a candidate per imaging-- he is agreeable to move ahead    Past Medical History:  Diagnosis Date  . Arthritis    "hands" (09/21/2016)  . Bleeding stomach ulcer 2000s  . COPD (chronic obstructive pulmonary disease) ([place])   . Depression   . Pneumonia    "now and several times before this" (09/21/2016)  . PVD (peripheral vascular disease) ([place]) 2014   prev PTCA on RLE    Past Surgical History:  Procedure Laterality Date  . JOINT REPLACEMENT    . KNEE ARTHROSCOPY Right X 1  . TOTAL HIP ARTHROPLASTY Right 2009    Allergies: Patient has no known allergies.  Medications: Prior to Admission medications   Medication Sig Start Date End Date Taking? Authorizing Provider  acetaminophen (TYLENOL) 650 MG CR tablet Take 1 tablet (650 mg total) by mouth every 8 (eight) hours as needed for pain. 07/11/18  Yes Hongalgi, Lenis Dickinson, MD  albuterol (PROVENTIL) (2.5 MG/3ML) 0.083% nebulizer solution Take 3 mLs (2.5 mg total) by nebulization 2 (two) times a day for 30 days. 07/11/18 08/10/18 Yes Hongalgi, Lenis Dickinson, MD  albuterol (VENTOLIN HFA) 108 (90 Base) MCG/ACT inhaler Inhale 2 puffs into the lungs every 4 (four) hours as needed for wheezing or shortness of breath. 07/11/18  Yes Hongalgi, Lenis Dickinson, MD  aspirin EC 81 MG tablet Take 81 mg by mouth daily.   Yes [provider]  atorvastatin (LIPITOR) 20 MG tablet Take 20 mg by mouth at bedtime.   Yes [provider]  budesonide-formoterol (SYMBICORT) 160-4.5  MCG/ACT inhaler Inhale 2 puffs into the lungs 2 (two) times daily.   Yes [provider]  cilostazol (PLETAL) 100 MG tablet Take 100 mg by mouth 2 (two) times daily.    Yes [provider]  gabapentin (NEURONTIN) 300 MG capsule Take 300 mg by mouth at bedtime.    Yes [provider]  HYDROcodone-acetaminophen (NORCO)  10-325 MG tablet Take 1-2 tablets by mouth See admin instructions. Take 2 tablets by mouth every morning and 1 tablet at night   Yes [provider]  losartan (COZAAR) 50 MG tablet Take 75 mg by mouth daily.   Yes [provider]  Multiple Vitamin (MULTIVITAMIN WITH MINERALS) TABS tablet Take 1 tablet by mouth daily.   Yes [provider]  OVER THE COUNTER MEDICATION Place 1 drop into both eyes daily as needed (dry eyes).   Yes [provider]  OXYGEN Inhale 2 L into the lungs at bedtime.    Yes [provider]  sertraline (ZOLOFT) 100 MG tablet Take 200 mg by mouth daily.    Yes [provider]  Tiotropium Bromide Monohydrate (SPIRIVA RESPIMAT) 2.5 MCG/ACT AERS Inhale 2 puffs into the lungs daily.   Yes [provider]  traZODone (DESYREL) 50 MG tablet Take 50 mg by mouth at bedtime.   Yes [provider]  vitamin B-12 (CYANOCOBALAMIN) 1000 MCG tablet Take 1,000 mcg by mouth daily.   Yes [provider]  Nutritional Supplements (ENSURE HIGH PROTEIN) LIQD Take 237 mLs by mouth See admin instructions. Drink one bottle (237 mls) by mouth up to 3 times daily as a meal supplement    [provider]  testosterone cypionate (DEPOTESTOTERONE CYPIONATE) 100 MG/ML injection Inject 200 mg into the muscle every 28 (twenty-eight) days.     [provider]     Family History  Problem Relation Age of Onset  . Cancer Father 37       Brain  . Dementia Mother     Social History   Socioeconomic History  . Marital status: Married    Spouse name: Not on file  . Number of children: Not on file  . Years of education: 63  . Highest education level: Not on file  Occupational History  . Occupation: Event organiser  Social Needs  . Financial resource strain: Not on file  . Food insecurity    Worry: Not on file    Inability: Not on file  . Transportation needs    Medical: Not on file    Non-medical: Not on  file  Tobacco Use  . Smoking status: Former Smoker    Packs/day: 1.00    Years: 55.00    Pack years: 55.00    Types: Cigarettes    Quit date: 04/2015    Years since quitting: 3.3  . Smokeless tobacco: Never Used  Substance and Sexual Activity  . Alcohol use: No    Alcohol/week: 0.0 standard drinks  . Drug use: No  . Sexual activity: Not Currently    Partners: Female  Lifestyle  . Physical activity    Days per week: Not on file    Minutes per session: Not on file  . Stress: Not on file  Relationships  . Social Herbalist on phone: Not on file    Gets together: Not on file    Attends religious service: Not on file    Active member of club or organization: Not on file    Attends  meetings of clubs or organizations: Not on file    Relationship status: Not on file  Other Topics Concern  . Not on file  Social History Narrative  . Not on file    Review of Systems: A 12 point ROS discussed and pertinent positives are indicated in the HPI above.  All other systems are negative.  Review of Systems  Constitutional: Negative for activity change, fatigue and fever.  Respiratory: Positive for cough. Negative for shortness of breath.        Hemoptysis  Cardiovascular: Negative for chest pain.  Gastrointestinal: Negative for abdominal pain.  Neurological: Negative for weakness.  Psychiatric/Behavioral: Negative for confusion and decreased concentration.    Vital Signs: BP 135/70   Pulse 82   Temp 97.8 F (36.6 C) (Oral)   Resp (!) 24   Ht _0  (1.778 m)   Wt 146 lb 9.7 oz (66.5 kg)   SpO2 92%   BMI 21.04 kg/m   Physical Exam Vitals signs reviewed.  Cardiovascular:     Rate and Rhythm: Normal rate and regular rhythm.     Heart sounds: Normal heart sounds.  Pulmonary:     Breath sounds: Normal breath sounds.  Abdominal:     Tenderness: There is no abdominal tenderness.  Musculoskeletal: Normal range of motion.  Skin:    General: Skin is warm and dry.   Neurological:     Mental Status: He is alert and oriented to person, place, and time.  Psychiatric:        Mood and Affect: Mood normal.        Behavior: Behavior normal.        Thought Content: Thought content normal.        Judgment: Judgment normal.     Imaging: Dg Chest 2 View  Result Date: 07/24/2018 CLINICAL DATA:  Cough and hemoptysis EXAM: CHEST - 2 VIEW COMPARISON:  07/08/2018.  CT dated 07/07/2018 FINDINGS: Bronchiectasis is again noted bilaterally. Emphysematous changes are noted. There is a medial right lung base opacity which is similar to prior CT. The lungs are hyperexpanded. There is chronic pleuroparenchymal scarring bilaterally. There is blunting of the right costophrenic angle. There is no large focal area of consolidation. There is no pneumothorax. IMPRESSION: 1. Trace right-sided pleural effusion. 2. Persistent bronchiectasis bilaterally. Scattered bilateral airspace opacities are again noted, likely similar to prior CT given differences in technique. 3. Persistent opacity in the medial right lung base, likely similar to prior CT given differences in technique. Electronically Signed   By: Constance Holster M.D.   On: 07/24/2018 18:17   Ct Angio Chest Pe W/cm &/or Wo Cm  Result Date: 07/07/2018 CLINICAL DATA:  Chest pain EXAM: CT ANGIOGRAPHY CHEST WITH CONTRAST TECHNIQUE: Multidetector CT imaging of the chest was performed using the standard protocol during bolus administration of intravenous contrast. Multiplanar CT image reconstructions and MIPs were obtained to evaluate the vascular anatomy. CONTRAST:  116m OMNIPAQUE IOHEXOL 350 MG/ML SOLN COMPARISON:  09/21/2016 FINDINGS: Cardiovascular: Normal heart size. No pericardial effusion. Negative for pulmonary artery filling defect. There is shunting away from the collapsed right lower lobe. Aortic and coronary atherosclerosis. Mediastinum/Nodes: Endotracheal tube in good position with tip between the clavicular heads and carina.  The orogastric tube tip is at the stomach. Mediastinal adenopathy. Subcarinal node measures 19 mm, similar to prior. Stable milder enlargement of bilateral hilar nodes. These nodes are likely reactive Lungs/Pleura: Continued collapse of the right lower lobe with debris or stricture of apical and posterior  basilar segments. 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 Reticulonodular opacity throughout bilateral lungs is improved, with decreasing nodular areas. Upper Abdomen: No acute finding Musculoskeletal: No acute or aggressive finding.  Spondylosis. Review of the MIP images confirms the above findings. IMPRESSION: Negative for pulmonary embolism. 1. History of hemoptysis with cylindrical bronchiectasis and airway narrowing in the right lower lobe has developed since 09/21/2016 chest CT. The affected segments are chronically collapsed. 2. 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 Electronically Signed   By: Monte Fantasia M.D.   On: 07/07/2018 14:59   Dg Chest Port 1 View  Result Date: 07/08/2018 CLINICAL DATA:  Acute respiratory failure EXAM: PORTABLE CHEST 1 VIEW COMPARISON:  CT chest dated 07/07/2018 FINDINGS: Endotracheal tube terminates 4.5 cm above the carina. Enteric tube courses into the stomach. Multifocal patchy opacities in the right upper lobe and left lower lobe, suggesting pneumonia of better evaluated on recent CT. Right lower lobe atelectasis/collapse. The heart is top-normal in size. IMPRESSION: Endotracheal tube terminates 4.5 cm above the carina. Multifocal pneumonia, better evaluated on recent CT. Right lower lobe atelectasis/collapse. Electronically Signed   By: Julian Hy M.D.   On: 07/08/2018 05:11   Dg Chest Port 1 View  Result Date: 07/07/2018 CLINICAL DATA:  Respiratory difficulty EXAM: PORTABLE CHEST 1 VIEW COMPARISON:  07/07/2018 1150 hours FINDINGS: Endotracheal tube placed with its tip  5.4 cm from the carina. NG tube placed with its tip beyond the gastroesophageal junction. Normal heart size. Heterogeneous opacities throughout both lungs are stable. There is volume loss in the right lung. No pneumothorax. Small right pleural effusion is suspected. IMPRESSION: Endotracheal and NG tubes placed as described. Small right pleural effusion is suspected. Opacities throughout both lungs are stable. Electronically Signed   By: Marybelle Killings M.D.   On: 07/07/2018 13:25   Dg Chest Port 1 View  Result Date: 07/07/2018 CLINICAL DATA:  Reason for exam: SOB  Hx of COPDsob EXAM: PORTABLE CHEST 1 VIEW COMPARISON:  Chest radiograph 11/07/2016 FINDINGS: Small RIGHT effusion and basilar opacity. Chronic interstitial lung disease. No pneumothorax. Hyperinflated lungs IMPRESSION: RIGHT pleural effusion and probable atelectasis or infiltrate. Electronically Signed   By: Suzy Bouchard M.D.   On: 07/07/2018 12:17    Labs:  CBC: Recent Labs    07/24/18 2135 07/25/18 0233 07/25/18 0520 07/26/18 0606  WBC 12.7* 9.0 7.5 7.2  HGB 8.8* 8.2* 8.5* 9.0*  HCT 29.7* 28.1* 28.8* 30.1*  PLT 368 336 353 352    COAGS: Recent Labs    07/24/18 2135  INR 1.1  APTT 34    BMP: Recent Labs    07/10/18 0310 07/11/18 0236 07/24/18 1800 07/26/18 0606  NA 138 138 137 141  K 3.4* 3.4* 4.4 4.1  CL 107 106 101 104  CO2 21* _0 GLUCOSE 86 102* 100* 83  BUN _1 CALCIUM 8.7* 8.6* 8.8* 9.0  CREATININE 0.85 0.77 0.98 0.99  GFRNONAA >60 >60 >60 >60  GFRAA >60 >60 >60 >60    LIVER FUNCTION TESTS: Recent Labs    07/07/18 1151 07/24/18 1800  BILITOT 0.3 0.5  AST 26 19  ALT 17 16  ALKPHOS 99 75  PROT 7.9 7.0  ALBUMIN 2.8* 2.8*    TUMOR MARKERS: No results for input(s): AFPTM, CEA, CA199, CHROMGRNA in the last 8760 hours.  Assessment and Plan:  Hemoptysis-- recurrent Known PNA and bronchiectasis Hx COPD-- quit smoking 4  yrs ago Hemoptysis continues Ordered CTA Chest per Dr  Earleen Newport Will consider bronchial artery embolization dependent on results. Pt is aware and agreeable   Thank you for this interesting consult.  I greatly enjoyed meeting William Kirk and look forward to participating in their care.  A copy of this report was sent to the requesting provider on this date.  Electronically Signed: Lavonia Drafts, PA-C 07/26/2018, 1:02 PM   I spent a total of 40 Minutes    in face to face in clinical consultation, greater than 50% of which was counseling/coordinating care for possible Bronchial artery embolization

## 2018-07-26 NOTE — Sedation Documentation (Signed)
5 Fr. Exoseal to right groin 

## 2018-07-26 NOTE — Progress Notes (Signed)
NAME:  William Kirk, MRN:  [id number], DOB:  1940-03-15, LOS: 1 ADMISSION DATE:  07/24/2018, CONSULTATION DATE:  6/16 REFERRING MD:  Dr. Rubin Payor EDP, CHIEF COMPLAINT:  SOB, Hemoptysis    Brief History   78 year old male with COPD and bronchiectasis. Recently admitted 5/30-6/3 with CAP, hemoptysis and newly discharged on 3L [place] (up from 2L PRN). Admitted 6/16 with several days of hemoptysis that increased on 6/16 prompting evaluation. He is on ASA + Pletal for PVD.    Past Medical History   has a past medical history of Arthritis, Bleeding stomach ulcer (2000s), COPD (chronic obstructive pulmonary disease) ([facility]), Depression, Pneumonia, and PVD (peripheral vascular disease) ([facility]) (2014).  Significant Hospital Events   5/30-6/3 admitted with hemoptysis requiring vent for 24 hours.  6/16 admit with hemoptysis.   Consults:  PCCM  Procedures:    Significant Diagnostic Tests:    Micro Data:  COVID- CEPHEID 6/16 >> negative   Antimicrobials:  Azithromycin 6/16 > Tobramycin (inhaled) 6/16 >  Interim history/subjective:  Pt reports he got up this am and had a large volume of hemoptysis, was unable to make it to the bathroom.  He estimates at least a half of a styrofoam cup but the nurse estimates it at 2 cups. Pt is hopeful to go home but willing to stay if procedures needed.   Objective   Blood pressure (!) 131/54, pulse 87, temperature 98.7 F (37.1 C), resp. rate 18, height 5\' 10"  (1.778 m), weight 66.5 kg, SpO2 (!) 84 %.        Intake/Output Summary (Last 24 hours) at 07/26/2018 1133 Last data filed at 07/26/2018 0100 Gross per 24 hour  Intake 250 ml  Output -  Net 250 ml   Filed Weights   07/24/18 1642 07/25/18 1424  Weight: 72.6 kg 66.5 kg    Examination: General: thin, elderly male lying in bed in NAD  HEENT: MM pink/moist, dentures  Neuro: AAOx4, speech clear, MAE  CV: s1s2 rrr, no m/r/g PULM: even/non-labored, lungs bilaterally clear anterior, bronchial  breath sounds at right base  WU:JWJX, non-tender, bsx4 active  Extremities: warm/dry, no edema  Skin: no rashes or lesions  Resolved Hospital Problem list     Assessment & Plan:   Acute on chronic hypoxemic respiratory failure -in setting of known bronchiectasis flare. Prior FOB's with Dr. Shelle Iron -home O2 [facility] -followed by Dr. Shelle Iron at [facility] P: Wean O2 for sats 88-94% Azithro + inhaled Tobramycin  Continue bronchodilators  No role for lung reduction surgery  IR consult recommended for evaluation for bronchial artery embolization given repeat episodes of hemoptysis  Hemoptysis P: Trend CBC  Monitor volume of hemoptysis  Hold ASA, pletal  BID PRN tessalon for cough   COPD without Acute Exacerbation P: Continue Dulera, Incruse  No role for steroids at this time    Rest Per Primary   Best practice:  Diet: Heart Pain/Anxiety/Delirium protocol (if indicated): Norco VAP protocol (if indicated): N/a DVT prophylaxis: Per primary GI prophylaxis: Per primary Glucose control: N/a Mobility: Per primary Code Status: FULL Family Communication: Patient updated on plan of care 6/18 Disposition: Progressive   Labs   CBC: Recent Labs  Lab 07/24/18 1800 07/24/18 2135 07/25/18 0233 07/25/18 0520 07/26/18 0606  WBC 13.8* 12.7* 9.0 7.5 7.2  NEUTROABS 11.3*  --   --   --   --   HGB 8.8* 8.8* 8.2* 8.5* 9.0*  HCT 30.0* 29.7* 28.1* 28.8* 30.1*  MCV 90.6 89.7 91.2 89.4  89.1  PLT 356 368 336 353 352    Basic Metabolic Panel: Recent Labs  Lab 07/24/18 1800 07/26/18 0606  NA 137 141  K 4.4 4.1  CL 101 104  CO2 28 30  GLUCOSE 100* 83  BUN 17 12  CREATININE 0.98 0.99  CALCIUM 8.8* 9.0   GFR: Estimated Creatinine Clearance: 59.7 mL/min (by C-G formula based on SCr of 0.99 mg/dL). Recent Labs  Lab 07/24/18 2135 07/25/18 0233 07/25/18 0520 07/26/18 0606  WBC 12.7* 9.0 7.5 7.2    Liver Function Tests: Recent Labs  Lab 07/24/18 1800  AST 19  ALT 16  ALKPHOS 75   BILITOT 0.5  PROT 7.0  ALBUMIN 2.8*   No results for input(s): LIPASE, AMYLASE in the last 168 hours. No results for input(s): AMMONIA in the last 168 hours.  ABG    Component Value Date/Time   PHART 7.227 (L) 07/07/2018 1622   PCO2ART 51.0 (H) 07/07/2018 1622   PO2ART 226.0 (H) 07/07/2018 1622   HCO3 21.3 07/07/2018 1622   TCO2 23 07/07/2018 1622   ACIDBASEDEF 7.0 (H) 07/07/2018 1622   O2SAT 100.0 07/07/2018 1622     Coagulation Profile: Recent Labs  Lab 07/24/18 2135  INR 1.1    Cardiac Enzymes: No results for input(s): CKTOTAL, CKMB, CKMBINDEX, TROPONINI in the last 168 hours.  HbA1C: No results found for: HGBA1C  CBG: No results for input(s): GLUCAP in the last 168 hours.  Canary Brim, NP-C [facility] Pgr: [phone] or if no answer [phone] 07/26/2018, 11:34 AM

## 2018-07-26 NOTE — Progress Notes (Signed)
PCCM Interval Note  I discussed possible FOB with the patient this am, explained the rationale. He understands. I have scheduled him for FOB under MAC sedation tomorrow at 13:00 in the OR.  I went to discuss the timing and plans with him this afternoon, but he is still in IR for bronchial arterial ablation. I will review with him in the am, get consent at that time.   I discussed the plans with Mrs Ferrari by phone.   Baltazar Apo, MD, PhD 07/26/2018, 5:05 PM [facility] [phone] or if no answer [phone]

## 2018-07-26 NOTE — Progress Notes (Signed)
Pt O2 moderated between 10-15 L d/t significant decrease to 77% saturation during emesis episode. RT called to assist.

## 2018-07-26 NOTE — Progress Notes (Signed)
Pt returned to unit.

## 2018-07-26 NOTE — Progress Notes (Signed)
PROGRESS NOTE    William Kirk  ZOX:[id number] DOB: November 25, 1940 DOA: 07/24/2018 PCP: [facility]    Brief Narrative:  William Kirk is a 78 y.o. male with medical history significant of COPD, bronchiectasis, PVD, arthritis, depression, hypertension presenting to the hospital for evaluation of cough and hemoptysis.  Patient was recently intubated and admitted to the ICU on Jul 07, 2018 for presumed CAP, bronchiectasis flare, and hemoptysis.  He required intubation for the initial 24 hours.  He was treated with 2 days of antibiotics which were discontinued once cultures were negative.  Hemoptysis was thought to be secondary to bronchiectasis flare.  CT did not demonstrate a focus and no bronchoscopy was done.  He was discharged on June 3.  Patient states he was doing okay since he left the hospital but then started coughing up blood again today.   Pt currently on 3 lit of [place] oxygen and PCCM consulted for further recommendations.   Assessment & Plan:   Principal Problem:   Hemoptysis Active Problems:   Acute respiratory failure with hypoxia (HCC)   Bronchiectasis (HCC)   Depression   Chronic obstructive pulmonary disease (HCC)   Hemoptysis in the setting of bronchiectasis Worsening hemoptysis earlier this am. Appreciate cardiothoracic surgery input.  Mild acute on chronic respiratory failure with hypoxia CT of the chest reviewed, COVID-19 rapid test negative.  PCCM consulted and recommendations given. Plan for CTA of the lungs, as per IR. For possible embolization.  Resume IV azithromycin and inhaled tobramycin. Resume bronchodilators.    Peripheral arterial disease holding aspirin and Pletal for hemoptysis.   COPD No wheezing heard continue with bronchodilators.   Essential hypertension Blood pressure parameters are good.    DVT prophylaxis: SCDs Code Status: Full code Family Communication: None at bedside , updated wife over the phone.  disposition  Plan: Pending clinical improvement and further management.    Consultants:   PCCM  Cardiothoracic surgery  IR  Procedures: None Antimicrobials: Azithromycin and inhaled tobramycin  Subjective: Worsening hemoptysis this am.   Objective: Vitals:   07/26/18 1635 07/26/18 1640 07/26/18 1645 07/26/18 1650  BP: (!) 132/54 (!) 123/56 (!) 129/54 (!) 125/57  Pulse: 62 65 65 63  Resp: 18 (!) 23 20 18   Temp:      TempSrc:      SpO2: 99% 100% 96% 96%  Weight:      Height:        Intake/Output Summary (Last 24 hours) at 07/26/2018 1652 Last data filed at 07/26/2018 0100 Gross per 24 hour  Intake 250 ml  Output -  Net 250 ml   Filed Weights   07/24/18 1642 07/25/18 1424  Weight: 72.6 kg 66.5 kg    Examination:  General exam: not in distress,  on 3l it of [place] oxygen.  Respiratory system: shallow inspiration, coughing, no wheezing heard.  Cardiovascular system: S1 & S2 heard, RRR. No JVD,  Gastrointestinal system: Abdomen is soft non tender non distended bowel sounds good.  Central nervous system: Alert and oriented. No focal neurological deficits. Extremities: Symmetric 5 x 5 power. Skin: No rashes, lesions or ulcers Psychiatry: Mood & affect appropriate.     Data Reviewed: I have personally reviewed following labs and imaging studies  CBC: Recent Labs  Lab 07/24/18 1800 07/24/18 2135 07/25/18 0233 07/25/18 0520 07/26/18 0606  WBC 13.8* 12.7* 9.0 7.5 7.2  NEUTROABS 11.3*  --   --   --   --   HGB 8.8* 8.8* 8.2* 8.5*  9.0*  HCT 30.0* 29.7* 28.1* 28.8* 30.1*  MCV 90.6 89.7 91.2 89.4 89.1  PLT 356 368 336 353 352   Basic Metabolic Panel: Recent Labs  Lab 07/24/18 1800 07/26/18 0606  NA 137 141  K 4.4 4.1  CL 101 104  CO2 28 30  GLUCOSE 100* 83  BUN 17 12  CREATININE 0.98 0.99  CALCIUM 8.8* 9.0   GFR: Estimated Creatinine Clearance: 59.7 mL/min (by C-G formula based on SCr of 0.99 mg/dL). Liver Function Tests: Recent Labs  Lab 07/24/18 1800  AST 19   ALT 16  ALKPHOS 75  BILITOT 0.5  PROT 7.0  ALBUMIN 2.8*   No results for input(s): LIPASE, AMYLASE in the last 168 hours. No results for input(s): AMMONIA in the last 168 hours. Coagulation Profile: Recent Labs  Lab 07/24/18 2135  INR 1.1   Cardiac Enzymes: No results for input(s): CKTOTAL, CKMB, CKMBINDEX, TROPONINI in the last 168 hours. BNP (last 3 results) No results for input(s): PROBNP in the last 8760 hours. HbA1C: No results for input(s): HGBA1C in the last 72 hours. CBG: No results for input(s): GLUCAP in the last 168 hours. Lipid Profile: No results for input(s): CHOL, HDL, LDLCALC, TRIG, CHOLHDL, LDLDIRECT in the last 72 hours. Thyroid Function Tests: No results for input(s): TSH, T4TOTAL, FREET4, T3FREE, THYROIDAB in the last 72 hours. Anemia Panel: No results for input(s): VITAMINB12, FOLATE, FERRITIN, TIBC, IRON, RETICCTPCT in the last 72 hours. Sepsis Labs: No results for input(s): PROCALCITON, LATICACIDVEN in the last 168 hours.  Recent Results (from the past 240 hour(s))  SARS Coronavirus 2     Status: None   Collection Time: 07/24/18  7:32 PM  Result Value Ref Range Status   SARS Coronavirus 2 NOT DETECTED NOT DETECTED Final    Comment: (NOTE) SARS-CoV-2 target nucleic acids are NOT DETECTED. The SARS-CoV-2 RNA is generally detectable in upper and lower respiratory specimens during the acute phase of infection.  Negative  results do not preclude SARS-CoV-2 infection, do not rule out co-infections with other pathogens, and should not be used as the sole basis for treatment or other patient management decisions.  Negative results must be combined with clinical observations, patient history, and epidemiological information. The expected result is Not Detected. Fact Sheet for Patients: http://www.biofiredefense.com/wp-content/uploads/2020/03/BIOFIRE-COVID -19-patients.pdf Fact Sheet for Healthcare Providers:  http://www.biofiredefense.com/wp-content/uploads/2020/03/BIOFIRE-COVID -19-hcp.pdf This test is not yet approved or cleared by the [place] and  has been authorized for detection and/or diagnosis of SARS-CoV-2 by FDA under an Emergency Use Authorization (EUA).  This EUA will remain in effec t (meaning this test can be used) for the duration of  the COVID-19 declaration under Section 564(b)(1) of the Act, 21 U.S.C. section 360bbb-3(b)(1), unless the authorization is terminated or revoked sooner. Performed at [facility], [address], [place], [place] [address]          Radiology Studies: Dg Chest 2 View  Result Date: 07/24/2018 CLINICAL DATA:  Cough and hemoptysis EXAM: CHEST - 2 VIEW COMPARISON:  07/08/2018.  CT dated 07/07/2018 FINDINGS: Bronchiectasis is again noted bilaterally. Emphysematous changes are noted. There is a medial right lung base opacity which is similar to prior CT. The lungs are hyperexpanded. There is chronic pleuroparenchymal scarring bilaterally. There is blunting of the right costophrenic angle. There is no large focal area of consolidation. There is no pneumothorax. IMPRESSION: 1. Trace right-sided pleural effusion. 2. Persistent bronchiectasis bilaterally. Scattered bilateral airspace opacities are again noted, likely similar to prior CT  given differences in technique. 3. Persistent opacity in the medial right lung base, likely similar to prior CT given differences in technique. Electronically Signed   By: Katherine Mantle M.D.   On: 07/24/2018 18:17   Ct Angio Chest Aorta W/cm &/or Wo/cm  Result Date: 07/26/2018 CLINICAL DATA:  78 year old male with a history of hemoptysis EXAM: CT ANGIOGRAPHY CHEST WITH CONTRAST TECHNIQUE: Multidetector CT imaging of the chest was performed using the standard protocol during bolus administration of intravenous contrast. Multiplanar CT image reconstructions and MIPs were obtained to evaluate the vascular  anatomy. CONTRAST:  OMNIPAQUE IOHEXOL 350 MG/ML SOLN COMPARISON:  07/07/2018, 09/21/2016, 06/21/2016 FINDINGS: Cardiovascular: Heart: No cardiomegaly. No pericardial fluid/thickening. Calcifications of left main, left anterior descending, circumflex, right coronary arteries. Aorta: Ascending aorta measures 3.4 cm. No dissection. No periaortic fluid/inflammation. Mild atherosclerosis of the thoracic aorta. Greatest diameter at the hiatus measures 2.9 cm. Three vessel arch with minimal atherosclerosis at the origins. Cervical arteries at the base of the neck are patent. Bilateral axillary arteries are patent. The CT angiogram demonstrate 4 bronchial arteries/bronchial artery origins. There is no significant hypertrophy or appreciable tortuosity of any of the bronchial arteries. Pulmonary arteries: Main pulmonary artery measures 3.2 cm. There is relative enlargement of the pulmonary arteries as they extend toward the outer 1/3 of the lung, relatively larger than the accompanying bronchi. No filling defects of the pulmonary arteries. Mediastinum/Nodes: Redemonstration multiple lymph nodes of the mediastinal nodal stations. The largest of the lowest paratracheal nodes are unchanged from the comparison CT. Subcarinal nodes remain enlarged measuring 17 mm-18 mm. Unremarkable appearance of the thoracic esophagus. Unremarkable thoracic inlet. Lungs/Pleura: Compared to the recent CT of 07/07/2018 and the remote CT of 09/21/2016, there has been improved aeration of the right lower lobe. Redemonstration of bronchiectasis of the right lower lobe, with some bronchi occluded with debris. There is associated partial volume loss in linear configuration along the bronchial vasculature. No evidence of extravasated contrast indicate active bleeding. Redemonstration of centrilobular nodularity throughout the bilateral lungs. Regions of nodularity improved from the remote CT of 2018, particularly of the lower lobes,. No  pneumothorax or pleural effusion. Upper Abdomen: No acute finding of the upper abdomen. Musculoskeletal: No acute displaced fracture. Degenerative changes of the spine. Review of the MIP images confirms the above findings. IMPRESSION: No acute arterial abnormality. Small bronchial arteries identified without significant tortuosity or engorgement. If the patient's reported hemoptysis is originating from bronchial artery source, the most likely site would be the right lower lobe, though bronchoscopy may be useful for confirming. Improving aeration of the right lower lobe, with redemonstration of advanced bronchiectasis. Multiple bronchi of the right lower lobe demonstrate partial occlusion with debris/fluid, potentially blood products, or sequela of aspiration. Redemonstration of centrilobular nodularity of the bilateral lungs, relatively similar in distribution to the CT of 07/07/2018, perhaps slightly improved. Overall there is significant improvement to the prior CT of 2018. While chronic infection as previously discussed such as MAC would be leading differential, follow-up CT once the patient has been treated/recovered may be useful to assure stability, particularly of the lingular nodularity. Mediastinal adenopathy persists, likely reactive. Aortic Atherosclerosis (ICD10-I70.0). Electronically Signed   By: Gilmer Mor D.O.   On: 07/26/2018 14:51        Scheduled Meds: . fentaNYL      . atorvastatin  20 mg Oral QHS  . gabapentin  300 mg Oral QHS  . HYDROcodone-acetaminophen  1 tablet Oral QHS  . HYDROcodone-acetaminophen  2 tablet  Oral Daily  . lidocaine      . midazolam      . mometasone-formoterol  2 puff Inhalation BID  . multivitamin with minerals  1 tablet Oral Daily  . sertraline  200 mg Oral Daily  . tobramycin (PF)  300 mg Nebulization BID  . traZODone  50 mg Oral QHS  . umeclidinium bromide  1 puff Inhalation Daily  . vitamin B-12  1,000 mcg Oral Daily   Continuous Infusions: .  sodium chloride 75 mL/hr at 07/26/18 1047  . sodium chloride 10 mL/hr (07/26/18 1521)  . azithromycin Stopped (07/26/18 0700)     LOS: 1 day    Time spent: 36 minutes.     Kathlen Mody, MD [facility] Pager [phone]   If 7PM-7AM, please contact night-coverage [email or web address] Password [facility] 07/26/2018, 4:52 PM

## 2018-07-26 NOTE — Progress Notes (Signed)
MD paged:Pt actively coughing up moderate-large amounts of blood.

## 2018-07-27 ENCOUNTER — Inpatient Hospital Stay (HOSPITAL_COMMUNITY): Payer: Medicare HMO | Admitting: Certified Registered Nurse Anesthetist

## 2018-07-27 ENCOUNTER — Encounter (HOSPITAL_COMMUNITY): Payer: Self-pay | Admitting: Interventional Radiology

## 2018-07-27 ENCOUNTER — Encounter (HOSPITAL_COMMUNITY)
Admission: EM | Disposition: A | Discharge status: Home / Self Care | Payer: Self-pay | Source: Home / Self Care | Attending: Internal Medicine

## 2018-07-27 HISTORY — PX: VIDEO BRONCHOSCOPY: SHX5072

## 2018-07-27 LAB — BASIC METABOLIC PANEL
Anion gap: 10 (ref 5–15)
BUN: 12 mg/dL (ref 8–23)
CO2: 26 mmol/L (ref 22–32)
Calcium: 9.1 mg/dL (ref 8.9–10.3)
Chloride: 104 mmol/L (ref 98–111)
Creatinine, Ser: 1.12 mg/dL (ref 0.61–1.24)
GFR calc Af Amer: >60 mL/min (ref >60)
GFR calc non Af Amer: >60 mL/min (ref >60)
Glucose, Bld: 94 mg/dL (ref 70–99)
Potassium: 3.5 mmol/L (ref 3.5–5.1)
Sodium: 140 mmol/L (ref 135–145)

## 2018-07-27 LAB — HEMOGLOBIN AND HEMATOCRIT, BLOOD
HCT: 28.4 % — ABNORMAL LOW (ref 39.0–52.0)
Hemoglobin: 8.6 g/dL — ABNORMAL LOW (ref 13.0–17.0)

## 2018-07-27 SURGERY — VIDEO BRONCHOSCOPY WITHOUT FLUORO
Anesthesia: General

## 2018-07-27 MED ORDER — ROCURONIUM BROMIDE 10 MG/ML (PF) SYRINGE
PREFILLED_SYRINGE | INTRAVENOUS | Status: DC | PRN
  Administered 2018-07-27: 20 mg via INTRAVENOUS

## 2018-07-27 MED ORDER — FENTANYL CITRATE (PF) 100 MCG/2ML IJ SOLN
INTRAMUSCULAR | Status: DC | PRN
  Administered 2018-07-27: 25 ug via INTRAVENOUS

## 2018-07-27 MED ORDER — EPINEPHRINE PF 1 MG/ML IJ SOLN
INTRAMUSCULAR | Status: AC
  Filled 2018-07-27: qty 1

## 2018-07-27 MED ORDER — ONDANSETRON HCL 4 MG/2ML IJ SOLN
INTRAMUSCULAR | Status: AC
  Filled 2018-07-27: qty 2

## 2018-07-27 MED ORDER — SUCCINYLCHOLINE CHLORIDE 200 MG/10ML IV SOSY
PREFILLED_SYRINGE | INTRAVENOUS | Status: AC
  Filled 2018-07-27: qty 10

## 2018-07-27 MED ORDER — ONDANSETRON HCL 4 MG/2ML IJ SOLN
INTRAMUSCULAR | Status: DC | PRN
  Administered 2018-07-27: 4 mg via INTRAVENOUS

## 2018-07-27 MED ORDER — MEPERIDINE HCL 25 MG/ML IJ SOLN: 6.2500 mg | INTRAMUSCULAR | Status: DC | PRN

## 2018-07-27 MED ORDER — PROPOFOL 10 MG/ML IV BOLUS
INTRAVENOUS | Status: DC | PRN
  Administered 2018-07-27: 120 mg via INTRAVENOUS

## 2018-07-27 MED ORDER — LIDOCAINE HCL (PF) 1 % IJ SOLN
INTRAMUSCULAR | Status: AC
  Filled 2018-07-27: qty 30

## 2018-07-27 MED ORDER — 0.9 % SODIUM CHLORIDE (POUR BTL) OPTIME
TOPICAL | Status: DC | PRN
  Administered 2018-07-27: 1000 mL

## 2018-07-27 MED ORDER — LACTATED RINGERS IV SOLN
INTRAVENOUS | Status: DC
  Administered 2018-07-27: 13:00:00 via INTRAVENOUS

## 2018-07-27 MED ORDER — LIDOCAINE 2% (20 MG/ML) 5 ML SYRINGE
INTRAMUSCULAR | Status: DC | PRN
  Administered 2018-07-27: 60 mg via INTRAVENOUS

## 2018-07-27 MED ORDER — SUCCINYLCHOLINE CHLORIDE 20 MG/ML IJ SOLN
INTRAMUSCULAR | Status: DC | PRN
  Administered 2018-07-27: 100 mg via INTRAVENOUS

## 2018-07-27 MED ORDER — ONDANSETRON HCL 4 MG/2ML IJ SOLN: 4.0000 mg | Freq: Once | INTRAMUSCULAR | Status: DC | PRN

## 2018-07-27 MED ORDER — FENTANYL CITRATE (PF) 250 MCG/5ML IJ SOLN
INTRAMUSCULAR | Status: AC
  Filled 2018-07-27: qty 5

## 2018-07-27 MED ORDER — FENTANYL CITRATE (PF) 100 MCG/2ML IJ SOLN: 25.0000 ug | INTRAMUSCULAR | Status: DC | PRN

## 2018-07-27 MED ORDER — SUGAMMADEX SODIUM 200 MG/2ML IV SOLN
INTRAVENOUS | Status: DC | PRN
  Administered 2018-07-27: 200 mg via INTRAVENOUS

## 2018-07-27 MED ORDER — PROPOFOL 10 MG/ML IV BOLUS
INTRAVENOUS | Status: AC
  Filled 2018-07-27: qty 20

## 2018-07-27 MED ORDER — LIDOCAINE 2% (20 MG/ML) 5 ML SYRINGE
INTRAMUSCULAR | Status: AC
  Filled 2018-07-27: qty 5

## 2018-07-27 SURGICAL SUPPLY — 30 items
BRUSH CYTOL CELLEBRITY 1.5X140 (MISCELLANEOUS) IMPLANT
CANISTER SUCT 3000ML PPV (MISCELLANEOUS) ×3 IMPLANT
CONT SPEC 4OZ CLIKSEAL STRL BL (MISCELLANEOUS) ×6 IMPLANT
COVER BACK TABLE 60X90IN (DRAPES) ×3 IMPLANT
COVER WAND RF STERILE (DRAPES) ×1 IMPLANT
FILTER STRAW FLUID ASPIR (MISCELLANEOUS) IMPLANT
FORCEPS BIOP RJ4 1.8 (CUTTING FORCEPS) IMPLANT
FORCEPS RADIAL JAW LRG 4 PULM (INSTRUMENTS) IMPLANT
GAUZE SPONGE 4X4 12PLY STRL (GAUZE/BANDAGES/DRESSINGS) ×3 IMPLANT
GLOVE BIO SURGEON STRL SZ7.5 (GLOVE) ×3 IMPLANT
GLOVE BIOGEL PI IND STRL 6.5 (GLOVE) IMPLANT
GLOVE BIOGEL PI INDICATOR 6.5 (GLOVE) ×2
GOWN STRL REUS W/ TWL LRG LVL3 (GOWN DISPOSABLE) ×1 IMPLANT
GOWN STRL REUS W/ TWL XL LVL3 (GOWN DISPOSABLE) ×1 IMPLANT
GOWN STRL REUS W/TWL LRG LVL3 (GOWN DISPOSABLE) ×3
GOWN STRL REUS W/TWL XL LVL3 (GOWN DISPOSABLE) ×3
KIT CLEAN ENDO COMPLIANCE (KITS) ×3 IMPLANT
KIT TURNOVER KIT B (KITS) ×3 IMPLANT
MARKER SKIN DUAL TIP RULER LAB (MISCELLANEOUS) ×3 IMPLANT
NS IRRIG 1000ML POUR BTL (IV SOLUTION) ×3 IMPLANT
OIL SILICONE PENTAX (PARTS (SERVICE/REPAIRS)) ×3 IMPLANT
PAD ARMBOARD 7.5X6 YLW CONV (MISCELLANEOUS) ×6 IMPLANT
RADIAL JAW LRG 4 PULMONARY (INSTRUMENTS)
SYR 20ML ECCENTRIC (SYRINGE) ×4 IMPLANT
SYR 5ML LL (SYRINGE) ×1 IMPLANT
SYR 5ML LUER SLIP (SYRINGE) ×1 IMPLANT
TRAP SPECIMEN MUCOUS 40CC (MISCELLANEOUS) ×5 IMPLANT
TUBE CONNECTING 20'X1/4 (TUBING) ×1
TUBE CONNECTING 20X1/4 (TUBING) ×2 IMPLANT
VALVE DISPOSABLE (MISCELLANEOUS) ×3 IMPLANT

## 2018-07-27 NOTE — Progress Notes (Signed)
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 Earleen Newport. Patient awake and alert laying in bed watching TV. Denies hemoptysis today. Right groin incision c/d/i.   Allergies: Patient has no known allergies.  Medications: Prior to Admission medications   Medication Sig Start Date End Date Taking? Authorizing Provider  acetaminophen (TYLENOL) 650 MG CR tablet Take 1 tablet (650 mg total) by mouth every 8 (eight) hours as needed for pain. 07/11/18  Yes Hongalgi, Lenis Dickinson, MD  albuterol (PROVENTIL) (2.5 MG/3ML) 0.083% nebulizer solution Take 3 mLs (2.5 mg total) by nebulization 2 (two) times a day for 30 days. 07/11/18 08/10/18 Yes Hongalgi, Lenis Dickinson, MD  albuterol (VENTOLIN HFA) 108 (90 Base) MCG/ACT inhaler Inhale 2 puffs into the lungs every 4 (four) hours as needed for wheezing or shortness of breath. 07/11/18  Yes Hongalgi, Lenis Dickinson, MD  aspirin EC 81 MG tablet Take 81 mg by mouth daily.   Yes [provider]  atorvastatin (LIPITOR) 20 MG tablet Take 20 mg by mouth at bedtime.   Yes [provider]  budesonide-formoterol (SYMBICORT) 160-4.5 MCG/ACT inhaler Inhale 2 puffs into the lungs 2 (two) times daily.   Yes [provider]  cilostazol (PLETAL) 100 MG tablet Take 100 mg by mouth 2 (two) times daily.    Yes [provider]  gabapentin (NEURONTIN) 300 MG capsule Take 300 mg by mouth at bedtime.    Yes [provider]  HYDROcodone-acetaminophen (NORCO) 10-325 MG tablet Take 1-2 tablets by mouth See admin instructions. Take 2 tablets by mouth every morning and 1 tablet at night   Yes [provider]  losartan (COZAAR) 50 MG tablet Take 75 mg by mouth daily.   Yes [provider]  Multiple Vitamin  (MULTIVITAMIN WITH MINERALS) TABS tablet Take 1 tablet by mouth daily.   Yes [provider]  OVER THE COUNTER MEDICATION Place 1 drop into both eyes daily as needed (dry eyes).   Yes [provider]  OXYGEN Inhale 2 L into the lungs at bedtime.    Yes [provider]  sertraline (ZOLOFT) 100 MG tablet Take 200 mg by mouth daily.    Yes [provider]  Tiotropium Bromide Monohydrate (SPIRIVA RESPIMAT) 2.5 MCG/ACT AERS Inhale 2 puffs into the lungs daily.   Yes [provider]  traZODone (DESYREL) 50 MG tablet Take 50 mg by mouth at bedtime.   Yes [provider]  vitamin B-12 (CYANOCOBALAMIN) 1000 MCG tablet Take 1,000 mcg by mouth daily.   Yes [provider]  Nutritional Supplements (ENSURE HIGH PROTEIN) LIQD Take 237 mLs by mouth See admin instructions. Drink one bottle (237 mls) by mouth up to 3 times daily as a meal supplement    [provider]  testosterone cypionate (DEPOTESTOTERONE CYPIONATE) 100 MG/ML injection Inject 200 mg into the muscle every 28 (twenty-eight) days.     [provider]     Vital Signs: BP (!) 125/52 (BP Location: Left Arm)    Pulse 62    Temp 98 F (36.7 C)    Resp (!) 24    Ht _0  (1.778 m)    Wt 146 lb 9.7 oz (66.5 kg)    SpO2 97%    BMI 21.04 kg/m   Physical Exam Vitals signs and nursing  note reviewed.  Constitutional:      General: He is not in acute distress.    Appearance: Normal appearance.  Pulmonary:     Effort: Pulmonary effort is normal. No respiratory distress.  Skin:    General: Skin is warm and dry.     Comments: Right groin incision soft without active bleeding or hematoma.  Neurological:     Mental Status: He is alert and oriented to person, place, and time.  Psychiatric:        Mood and Affect: Mood normal.        Behavior: Behavior normal.        Thought Content: Thought content normal.        Judgment: Judgment normal.     Imaging: Dg Chest 2  View  Result Date: 07/24/2018 CLINICAL DATA:  Cough and hemoptysis EXAM: CHEST - 2 VIEW COMPARISON:  07/08/2018.  CT dated 07/07/2018 FINDINGS: Bronchiectasis is again noted bilaterally. Emphysematous changes are noted. There is a medial right lung base opacity which is similar to prior CT. The lungs are hyperexpanded. There is chronic pleuroparenchymal scarring bilaterally. There is blunting of the right costophrenic angle. There is no large focal area of consolidation. There is no pneumothorax. IMPRESSION: 1. Trace right-sided pleural effusion. 2. Persistent bronchiectasis bilaterally. Scattered bilateral airspace opacities are again noted, likely similar to prior CT given differences in technique. 3. Persistent opacity in the medial right lung base, likely similar to prior CT given differences in technique. Electronically Signed   By: Constance Holster M.D.   On: 07/24/2018 18:17   Ir Angiogram Selective Each Additional Vessel  Result Date: 07/27/2018 INDICATION: 78 year old male with a history of hemoptysis, presumably right-sided at site of worst bronchiectasis and CT changes EXAM: ULTRASOUND-GUIDED RIGHT COMMON FEMORAL ARTERY ACCESS BRONCHIAL ARTERY ANGIOGRAM EMBOLIZATION OF PATHOLOGIC RIGHT BRONCHIAL ARTERIES WITH EMBOSPHERES MEDICATIONS: None ANESTHESIA/SEDATION: Moderate (conscious) sedation was employed during this procedure. A total of Versed 3.0 mg and Fentanyl 25 mcg was administered intravenously. Moderate Sedation Time: 59 minutes. The patient's level of consciousness and vital signs were monitored continuously by radiology nursing throughout the procedure under my direct supervision. CONTRAST:  100 cc FLUOROSCOPY TIME:  Fluoroscopy Time: 20 minutes 0 seconds (382 mGy). COMPLICATIONS: None PROCEDURE: Informed consent was obtained from the patient following explanation of the procedure, risks, benefits and alternatives. The patient understands, agrees and consents for the procedure. All  questions were addressed. A time out was performed prior to the initiation of the procedure. Maximal barrier sterile technique utilized including caps, mask, sterile gowns, sterile gloves, large sterile drape, hand hygiene, and Betadine prep. Ultrasound survey of the right inguinal region was performed with images stored and sent to PACs, confirming patency of the vessel. A micropuncture needle was used access the right common femoral artery under ultrasound. With excellent arterial blood flow returned, and an .018 micro wire was passed through the needle, observed enter the abdominal aorta under fluoroscopy. The needle was removed, and a micropuncture sheath was placed over the wire. The inner dilator and wire were removed, and an 035 Bentson wire was advanced under fluoroscopy into the iliac artery. Atherosclerotic changes impeded advancement into the aorta. Five French sheath was placed. The dilator was removed and the sheath was flushed. Standard Glidewire and angled MPA catheter used to navigate the Glidewire into the aorta. With the catheter in the abdominal aorta, Bentson wire was replaced. MPA catheter was removed. Mickelson catheter passed over the Bentson wire. We proceeded with segmental angiogram/bronchial artery  angiogram. Of the 3 or 4 origins suggested on prior CT, 2 of these were identified. The lower of which was small caliber and would not except the tip of the catheter, not enlarged. The most superior bronchial artery also had the origin of the supreme intercostal artery proximal to the right bronchial arteries. With the tip of the Mickelson catheter at the origin, we attempted passage of the microcatheter first with 014 fathom wire, then with 018 fathom wire. The 018 fathom wire was more useful in achieving purchase into the bronchial arteries, and we passed the 025 penumbra lantern catheter beyond the origin of the supra mentor costal artery. Angiogram was performed. We then performed  embolization to stasis with 300-500 microspheres. One vial was used. Repeat angiogram was performed. The patient's mental status during the case was most likely related to a paradoxical reaction to the moderate sedation, and during the case restraints or in fluid for safety. After these arteries were embolized we withdrew from the case for safety. All catheters wires were removed. Exoseal was deployed at the right common femoral artery for hemostasis. Patient remained hemodynamically stable throughout. No complications were encountered and no significant blood loss. During the case the patient experienced paradoxical reaction to the moderate sedation, with deteriorating safety and employed restraints during the case. FINDINGS: Significant atherosclerotic changes of the iliac arteries, requiring precise Glidewire navigation for placement of the initial access. Two of the 3 bronchial arteries were identified. The bronchial artery that was engaged and treated corresponds to the origin/findings on the recent CT chest (image 97 series 8). These arteries were embolized to stasis beyond the origin of the supreme intercostal artery. Stasis was achieved in the bronchial arteries. Final angiogram of the supreme inter costal demonstrates no effects of embolization/non target embolization IMPRESSION: Status post ultrasound guided access right common femoral artery for bronchial artery angiogram and embolization of the most superior bronchial arteries, clearly pathologic on the angiogram. Signed, Dulcy Fanny. Dellia Nims, RPVI [facility] Inter[facility] Rad[facility] Radiology Electronically Signed   By: Corrie Mckusick D.O.   On: 07/27/2018 07:38   Ir Angiogram Selective Each Additional Vessel  Result Date: 07/27/2018 INDICATION: 78 year old male with a history of hemoptysis, presumably right-sided at site of worst bronchiectasis and CT changes EXAM: ULTRASOUND-GUIDED RIGHT COMMON FEMORAL ARTERY ACCESS  BRONCHIAL ARTERY ANGIOGRAM EMBOLIZATION OF PATHOLOGIC RIGHT BRONCHIAL ARTERIES WITH EMBOSPHERES MEDICATIONS: None ANESTHESIA/SEDATION: Moderate (conscious) sedation was employed during this procedure. A total of Versed 3.0 mg and Fentanyl 25 mcg was administered intravenously. Moderate Sedation Time: 59 minutes. The patient's level of consciousness and vital signs were monitored continuously by radiology nursing throughout the procedure under my direct supervision. CONTRAST:  100 cc FLUOROSCOPY TIME:  Fluoroscopy Time: 20 minutes 0 seconds (382 mGy). COMPLICATIONS: None PROCEDURE: Informed consent was obtained from the patient following explanation of the procedure, risks, benefits and alternatives. The patient understands, agrees and consents for the procedure. All questions were addressed. A time out was performed prior to the initiation of the procedure. Maximal barrier sterile technique utilized including caps, mask, sterile gowns, sterile gloves, large sterile drape, hand hygiene, and Betadine prep. Ultrasound survey of the right inguinal region was performed with images stored and sent to PACs, confirming patency of the vessel. A micropuncture needle was used access the right common femoral artery under ultrasound. With excellent arterial blood flow returned, and an .018 micro wire was passed through the needle, observed enter the abdominal aorta under fluoroscopy. The needle was removed, and  a micropuncture sheath was placed over the wire. The inner dilator and wire were removed, and an 035 Bentson wire was advanced under fluoroscopy into the iliac artery. Atherosclerotic changes impeded advancement into the aorta. Five French sheath was placed. The dilator was removed and the sheath was flushed. Standard Glidewire and angled MPA catheter used to navigate the Glidewire into the aorta. With the catheter in the abdominal aorta, Bentson wire was replaced. MPA catheter was removed. Mickelson catheter passed over  the Bentson wire. We proceeded with segmental angiogram/bronchial artery angiogram. Of the 3 or 4 origins suggested on prior CT, 2 of these were identified. The lower of which was small caliber and would not except the tip of the catheter, not enlarged. The most superior bronchial artery also had the origin of the supreme intercostal artery proximal to the right bronchial arteries. With the tip of the Mickelson catheter at the origin, we attempted passage of the microcatheter first with 014 fathom wire, then with 018 fathom wire. The 018 fathom wire was more useful in achieving purchase into the bronchial arteries, and we passed the 025 penumbra lantern catheter beyond the origin of the supra mentor costal artery. Angiogram was performed. We then performed embolization to stasis with 300-500 microspheres. One vial was used. Repeat angiogram was performed. The patient's mental status during the case was most likely related to a paradoxical reaction to the moderate sedation, and during the case restraints or in fluid for safety. After these arteries were embolized we withdrew from the case for safety. All catheters wires were removed. Exoseal was deployed at the right common femoral artery for hemostasis. Patient remained hemodynamically stable throughout. No complications were encountered and no significant blood loss. During the case the patient experienced paradoxical reaction to the moderate sedation, with deteriorating safety and employed restraints during the case. FINDINGS: Significant atherosclerotic changes of the iliac arteries, requiring precise Glidewire navigation for placement of the initial access. Two of the 3 bronchial arteries were identified. The bronchial artery that was engaged and treated corresponds to the origin/findings on the recent CT chest (image 97 series 8). These arteries were embolized to stasis beyond the origin of the supreme intercostal artery. Stasis was achieved in the bronchial  arteries. Final angiogram of the supreme inter costal demonstrates no effects of embolization/non target embolization IMPRESSION: Status post ultrasound guided access right common femoral artery for bronchial artery angiogram and embolization of the most superior bronchial arteries, clearly pathologic on the angiogram. Signed, Dulcy Fanny. Dellia Nims, RPVI Vascular and Interventional Radiology Specialists [facility] Radiology Electronically Signed   By: Corrie Mckusick D.O.   On: 07/27/2018 07:38   Ir Angiogram Selective Each Additional Vessel  Result Date: 07/27/2018 INDICATION: 78 year old male with a history of hemoptysis, presumably right-sided at site of worst bronchiectasis and CT changes EXAM: ULTRASOUND-GUIDED RIGHT COMMON FEMORAL ARTERY ACCESS BRONCHIAL ARTERY ANGIOGRAM EMBOLIZATION OF PATHOLOGIC RIGHT BRONCHIAL ARTERIES WITH EMBOSPHERES MEDICATIONS: None ANESTHESIA/SEDATION: Moderate (conscious) sedation was employed during this procedure. A total of Versed 3.0 mg and Fentanyl 25 mcg was administered intravenously. Moderate Sedation Time: 59 minutes. The patient's level of consciousness and vital signs were monitored continuously by radiology nursing throughout the procedure under my direct supervision. CONTRAST:  100 cc FLUOROSCOPY TIME:  Fluoroscopy Time: 20 minutes 0 seconds (382 mGy). COMPLICATIONS: None PROCEDURE: Informed consent was obtained from the patient following explanation of the procedure, risks, benefits and alternatives. The patient understands, agrees and consents for the procedure. All questions were addressed. A time out  was performed prior to the initiation of the procedure. Maximal barrier sterile technique utilized including caps, mask, sterile gowns, sterile gloves, large sterile drape, hand hygiene, and Betadine prep. Ultrasound survey of the right inguinal region was performed with images stored and sent to PACs, confirming patency of the vessel. A micropuncture needle was used  access the right common femoral artery under ultrasound. With excellent arterial blood flow returned, and an .018 micro wire was passed through the needle, observed enter the abdominal aorta under fluoroscopy. The needle was removed, and a micropuncture sheath was placed over the wire. The inner dilator and wire were removed, and an 035 Bentson wire was advanced under fluoroscopy into the iliac artery. Atherosclerotic changes impeded advancement into the aorta. Five French sheath was placed. The dilator was removed and the sheath was flushed. Standard Glidewire and angled MPA catheter used to navigate the Glidewire into the aorta. With the catheter in the abdominal aorta, Bentson wire was replaced. MPA catheter was removed. Mickelson catheter passed over the Bentson wire. We proceeded with segmental angiogram/bronchial artery angiogram. Of the 3 or 4 origins suggested on prior CT, 2 of these were identified. The lower of which was small caliber and would not except the tip of the catheter, not enlarged. The most superior bronchial artery also had the origin of the supreme intercostal artery proximal to the right bronchial arteries. With the tip of the Mickelson catheter at the origin, we attempted passage of the microcatheter first with 014 fathom wire, then with 018 fathom wire. The 018 fathom wire was more useful in achieving purchase into the bronchial arteries, and we passed the 025 penumbra lantern catheter beyond the origin of the supra mentor costal artery. Angiogram was performed. We then performed embolization to stasis with 300-500 microspheres. One vial was used. Repeat angiogram was performed. The patient's mental status during the case was most likely related to a paradoxical reaction to the moderate sedation, and during the case restraints or in fluid for safety. After these arteries were embolized we withdrew from the case for safety. All catheters wires were removed. Exoseal was deployed at the right  common femoral artery for hemostasis. Patient remained hemodynamically stable throughout. No complications were encountered and no significant blood loss. During the case the patient experienced paradoxical reaction to the moderate sedation, with deteriorating safety and employed restraints during the case. FINDINGS: Significant atherosclerotic changes of the iliac arteries, requiring precise Glidewire navigation for placement of the initial access. Two of the 3 bronchial arteries were identified. The bronchial artery that was engaged and treated corresponds to the origin/findings on the recent CT chest (image 97 series 8). These arteries were embolized to stasis beyond the origin of the supreme intercostal artery. Stasis was achieved in the bronchial arteries. Final angiogram of the supreme inter costal demonstrates no effects of embolization/non target embolization IMPRESSION: Status post ultrasound guided access right common femoral artery for bronchial artery angiogram and embolization of the most superior bronchial arteries, clearly pathologic on the angiogram. Signed, Dulcy Fanny. Dellia Nims, RPVI Vascular and Interventional Radiology Specialists [facility] Radiology Electronically Signed   By: Corrie Mckusick D.O.   On: 07/27/2018 07:38   Ir US Guide Vasc Access Right  Result Date: 07/27/2018 INDICATION: 78 year old male with a history of hemoptysis, presumably right-sided at site of worst bronchiectasis and CT changes EXAM: ULTRASOUND-GUIDED RIGHT COMMON FEMORAL ARTERY ACCESS BRONCHIAL ARTERY ANGIOGRAM EMBOLIZATION OF PATHOLOGIC RIGHT BRONCHIAL ARTERIES WITH EMBOSPHERES MEDICATIONS: None ANESTHESIA/SEDATION: Moderate (conscious) sedation was employed during  this procedure. A total of Versed 3.0 mg and Fentanyl 25 mcg was administered intravenously. Moderate Sedation Time: 59 minutes. The patient's level of consciousness and vital signs were monitored continuously by radiology nursing throughout the procedure  under my direct supervision. CONTRAST:  100 cc FLUOROSCOPY TIME:  Fluoroscopy Time: 20 minutes 0 seconds (382 mGy). COMPLICATIONS: None PROCEDURE: Informed consent was obtained from the patient following explanation of the procedure, risks, benefits and alternatives. The patient understands, agrees and consents for the procedure. All questions were addressed. A time out was performed prior to the initiation of the procedure. Maximal barrier sterile technique utilized including caps, mask, sterile gowns, sterile gloves, large sterile drape, hand hygiene, and Betadine prep. Ultrasound survey of the right inguinal region was performed with images stored and sent to PACs, confirming patency of the vessel. A micropuncture needle was used access the right common femoral artery under ultrasound. With excellent arterial blood flow returned, and an .018 micro wire was passed through the needle, observed enter the abdominal aorta under fluoroscopy. The needle was removed, and a micropuncture sheath was placed over the wire. The inner dilator and wire were removed, and an 035 Bentson wire was advanced under fluoroscopy into the iliac artery. Atherosclerotic changes impeded advancement into the aorta. Five French sheath was placed. The dilator was removed and the sheath was flushed. Standard Glidewire and angled MPA catheter used to navigate the Glidewire into the aorta. With the catheter in the abdominal aorta, Bentson wire was replaced. MPA catheter was removed. Mickelson catheter passed over the Bentson wire. We proceeded with segmental angiogram/bronchial artery angiogram. Of the 3 or 4 origins suggested on prior CT, 2 of these were identified. The lower of which was small caliber and would not except the tip of the catheter, not enlarged. The most superior bronchial artery also had the origin of the supreme intercostal artery proximal to the right bronchial arteries. With the tip of the Mickelson catheter at the origin, we  attempted passage of the microcatheter first with 014 fathom wire, then with 018 fathom wire. The 018 fathom wire was more useful in achieving purchase into the bronchial arteries, and we passed the 025 penumbra lantern catheter beyond the origin of the supra mentor costal artery. Angiogram was performed. We then performed embolization to stasis with 300-500 microspheres. One vial was used. Repeat angiogram was performed. The patient's mental status during the case was most likely related to a paradoxical reaction to the moderate sedation, and during the case restraints or in fluid for safety. After these arteries were embolized we withdrew from the case for safety. All catheters wires were removed. Exoseal was deployed at the right common femoral artery for hemostasis. Patient remained hemodynamically stable throughout. No complications were encountered and no significant blood loss. During the case the patient experienced paradoxical reaction to the moderate sedation, with deteriorating safety and employed restraints during the case. FINDINGS: Significant atherosclerotic changes of the iliac arteries, requiring precise Glidewire navigation for placement of the initial access. Two of the 3 bronchial arteries were identified. The bronchial artery that was engaged and treated corresponds to the origin/findings on the recent CT chest (image 97 series 8). These arteries were embolized to stasis beyond the origin of the supreme intercostal artery. Stasis was achieved in the bronchial arteries. Final angiogram of the supreme inter costal demonstrates no effects of embolization/non target embolization IMPRESSION: Status post ultrasound guided access right common femoral artery for bronchial artery angiogram and embolization of the most superior  bronchial arteries, clearly pathologic on the angiogram. Signed, Dulcy Fanny. Dellia Nims, RPVI Vascular and Interventional Radiology Specialists [facility] Radiology Electronically  Signed   By: Corrie Mckusick D.O.   On: 07/27/2018 07:38   Ct Angio Chest Aorta W/cm &/or Wo/cm  Result Date: 07/26/2018 CLINICAL DATA:  78 year old male with a history of hemoptysis EXAM: CT ANGIOGRAPHY CHEST WITH CONTRAST TECHNIQUE: Multidetector CT imaging of the chest was performed using the standard protocol during bolus administration of intravenous contrast. Multiplanar CT image reconstructions and MIPs were obtained to evaluate the vascular anatomy. CONTRAST:  15m OMNIPAQUE IOHEXOL 350 MG/ML SOLN COMPARISON:  07/07/2018, 09/21/2016, 06/21/2016 FINDINGS: Cardiovascular: Heart: No cardiomegaly. No pericardial fluid/thickening. Calcifications of left main, left anterior descending, circumflex, right coronary arteries. Aorta: Ascending aorta measures 3.4 cm. No dissection. No periaortic fluid/inflammation. Mild atherosclerosis of the thoracic aorta. Greatest diameter at the hiatus measures 2.9 cm. Three vessel arch with minimal atherosclerosis at the origins. Cervical arteries at the base of the neck are patent. Bilateral axillary arteries are patent. The CT angiogram demonstrate 4 bronchial arteries/bronchial artery origins. There is no significant hypertrophy or appreciable tortuosity of any of the bronchial arteries. Pulmonary arteries: Main pulmonary artery measures 3.2 cm. There is relative enlargement of the pulmonary arteries as they extend toward the outer 1/3 of the lung, relatively larger than the accompanying bronchi. No filling defects of the pulmonary arteries. Mediastinum/Nodes: Redemonstration multiple lymph nodes of the mediastinal nodal stations. The largest of the lowest paratracheal nodes are unchanged from the comparison CT. Subcarinal nodes remain enlarged measuring 17 mm-18 mm. Unremarkable appearance of the thoracic esophagus. Unremarkable thoracic inlet. Lungs/Pleura: Compared to the recent CT of 07/07/2018 and the remote CT of 09/21/2016, there has been improved aeration of the right  lower lobe. Redemonstration of bronchiectasis of the right lower lobe, with some bronchi occluded with debris. There is associated partial volume loss in linear configuration along the bronchial vasculature. No evidence of extravasated contrast indicate active bleeding. Redemonstration of centrilobular nodularity throughout the bilateral lungs. Regions of nodularity improved from the remote CT of 2018, particularly of the lower lobes,. No pneumothorax or pleural effusion. Upper Abdomen: No acute finding of the upper abdomen. Musculoskeletal: No acute displaced fracture. Degenerative changes of the spine. Review of the MIP images confirms the above findings. IMPRESSION: No acute arterial abnormality. Small bronchial arteries identified without significant tortuosity or engorgement. If the patient's reported hemoptysis is originating from bronchial artery source, the most likely site would be the right lower lobe, though bronchoscopy may be useful for confirming. Improving aeration of the right lower lobe, with redemonstration of advanced bronchiectasis. Multiple bronchi of the right lower lobe demonstrate partial occlusion with debris/fluid, potentially blood products, or sequela of aspiration. Redemonstration of centrilobular nodularity of the bilateral lungs, relatively similar in distribution to the CT of 07/07/2018, perhaps slightly improved. Overall there is significant improvement to the prior CT of 2018. While chronic infection as previously discussed such as MAC would be leading differential, follow-up CT once the patient has been treated/recovered may be useful to assure stability, particularly of the lingular nodularity. Mediastinal adenopathy persists, likely reactive. Aortic Atherosclerosis (ICD10-I70.0). Electronically Signed   By: JCorrie MckusickD.O.   On: 07/26/2018 14:51   [facility] [employer]  Result Date: 07/27/2018 INDICATION: 78year old male with a  history of hemoptysis, presumably right-sided at site of worst bronchiectasis and CT changes EXAM: ULTRASOUND-GUIDED RIGHT COMMON FEMORAL ARTERY ACCESS BRONCHIAL ARTERY ANGIOGRAM  EMBOLIZATION OF PATHOLOGIC RIGHT BRONCHIAL ARTERIES WITH EMBOSPHERES MEDICATIONS: None ANESTHESIA/SEDATION: Moderate (conscious) sedation was employed during this procedure. A total of Versed 3.0 mg and Fentanyl 25 mcg was administered intravenously. Moderate Sedation Time: 59 minutes. The patient's level of consciousness and vital signs were monitored continuously by radiology nursing throughout the procedure under my direct supervision. CONTRAST:  100 cc FLUOROSCOPY TIME:  Fluoroscopy Time: 20 minutes 0 seconds (382 mGy). COMPLICATIONS: None PROCEDURE: Informed consent was obtained from the patient following explanation of the procedure, risks, benefits and alternatives. The patient understands, agrees and consents for the procedure. All questions were addressed. A time out was performed prior to the initiation of the procedure. Maximal barrier sterile technique utilized including caps, mask, sterile gowns, sterile gloves, large sterile drape, hand hygiene, and Betadine prep. Ultrasound survey of the right inguinal region was performed with images stored and sent to PACs, confirming patency of the vessel. A micropuncture needle was used access the right common femoral artery under ultrasound. With excellent arterial blood flow returned, and an .018 micro wire was passed through the needle, observed enter the abdominal aorta under fluoroscopy. The needle was removed, and a micropuncture sheath was placed over the wire. The inner dilator and wire were removed, and an 035 Bentson wire was advanced under fluoroscopy into the iliac artery. Atherosclerotic changes impeded advancement into the aorta. Five French sheath was placed. The dilator was removed and the sheath was flushed. Standard Glidewire and angled MPA catheter used to navigate the  Glidewire into the aorta. With the catheter in the abdominal aorta, Bentson wire was replaced. MPA catheter was removed. Mickelson catheter passed over the Bentson wire. We proceeded with segmental angiogram/bronchial artery angiogram. Of the 3 or 4 origins suggested on prior CT, 2 of these were identified. The lower of which was small caliber and would not except the tip of the catheter, not enlarged. The most superior bronchial artery also had the origin of the supreme intercostal artery proximal to the right bronchial arteries. With the tip of the Mickelson catheter at the origin, we attempted passage of the microcatheter first with 014 fathom wire, then with 018 fathom wire. The 018 fathom wire was more useful in achieving purchase into the bronchial arteries, and we passed the 025 penumbra lantern catheter beyond the origin of the supra mentor costal artery. Angiogram was performed. We then performed embolization to stasis with 300-500 microspheres. One vial was used. Repeat angiogram was performed. The patient's mental status during the case was most likely related to a paradoxical reaction to the moderate sedation, and during the case restraints or in fluid for safety. After these arteries were embolized we withdrew from the case for safety. All catheters wires were removed. Exoseal was deployed at the right common femoral artery for hemostasis. Patient remained hemodynamically stable throughout. No complications were encountered and no significant blood loss. During the case the patient experienced paradoxical reaction to the moderate sedation, with deteriorating safety and employed restraints during the case. FINDINGS: Significant atherosclerotic changes of the iliac arteries, requiring precise Glidewire navigation for placement of the initial access. Two of the 3 bronchial arteries were identified. The bronchial artery that was engaged and treated corresponds to the origin/findings on the recent CT chest  (image 97 series 8). These arteries were embolized to stasis beyond the origin of the supreme intercostal artery. Stasis was achieved in the bronchial arteries. Final angiogram of the supreme inter costal demonstrates no effects of embolization/non target embolization IMPRESSION: Status post  ultrasound guided access right common femoral artery for bronchial artery angiogram and embolization of the most superior bronchial arteries, clearly pathologic on the angiogram. Signed, Dulcy Fanny. Dellia Nims, RPVI [facility] Electronically Signed   By: Corrie Mckusick D.O.   On: 07/27/2018 07:38    Labs:  CBC: Recent Labs    07/24/18 2135 07/25/18 0233 07/25/18 0520 07/26/18 0606  WBC 12.7* 9.0 7.5 7.2  HGB 8.8* 8.2* 8.5* 9.0*  HCT 29.7* 28.1* 28.8* 30.1*  PLT 368 336 353 352    COAGS: Recent Labs    07/24/18 2135  INR 1.1  APTT 34    BMP: Recent Labs    07/11/18 0236 07/24/18 1800 07/26/18 0606 07/27/18 1021  NA 138 137 141 140  K 3.4* 4.4 4.1 3.5  CL 106 101 104 104  CO2 _0 GLUCOSE 102* 100* 83 94  BUN _1 CALCIUM 8.6* 8.8* 9.0 9.1  CREATININE 0.77 0.98 0.99 1.12  GFRNONAA >60 >60 >60 >60  GFRAA >60 >60 >60 >60    LIVER FUNCTION TESTS: Recent Labs    07/07/18 1151 07/24/18 1800  BILITOT 0.3 0.5  AST 26 19  ALT 17 16  ALKPHOS 99 75  PROT 7.9 7.0  ALBUMIN 2.8* 2.8*    Assessment and Plan:  Hemoptysis secondary to presumed bronchiectasis s/p endovascular embolization of superior bronchial arteries using microspheres 07/26/2018 by Dr. Earleen Newport. Patient stable- denies hemoptysis. Hgb up to 9.0 (from 8.5). Right groin incision stable. Further plans per TRH/CCM- appreciate and agree with management. Please call IR with questions/concerns.   Electronically Signed: Earley Abide, PA-C 07/27/2018, 2:57 PM   I spent a total of 25 Minutes at the the patient's bedside AND on the patient's  hospital floor or unit, greater than 50% of which was counseling/coordinating care for hemoptysis secondary to bronchiectasis s/p bronchial artery embolization.

## 2018-07-27 NOTE — Care Management Important Message (Signed)
Important Message  Patient Details  Name: William Kirk MRN: [id number] Date of Birth: 10-01-1940   Medicare Important Message Given:  No  Patient refused to sign.    Tarhonda Hollenberg 07/27/2018, 11:13 AM

## 2018-07-27 NOTE — Progress Notes (Signed)
PROGRESS NOTE    William Kirk  WUJ:[id number] DOB: 08-12-1940 DOA: 07/24/2018 PCP: [facility]    Brief Narrative:  William Kirk is a 78 y.o. male with medical history significant of COPD, bronchiectasis, PVD, arthritis, depression, hypertension presenting to the hospital for evaluation of cough and hemoptysis.  Patient was recently intubated and admitted to the ICU on Jul 07, 2018 for presumed CAP, bronchiectasis flare, and hemoptysis.  He required intubation for the initial 24 hours.  He was treated with 2 days of antibiotics which were discontinued once cultures were negative.  Hemoptysis was thought to be secondary to bronchiectasis flare.  CT did not demonstrate a focus and no bronchoscopy was done.  He was discharged on June 3.  Patient states he was doing okay since he left the hospital but then started coughing up blood again today.   Pt currently on 3 lit of [place] oxygen and PCCM consulted for further recommendations.   Assessment & Plan:   Principal Problem:   Hemoptysis Active Problems:   Acute respiratory failure with hypoxia (HCC)   Bronchiectasis (HCC)   Depression   Chronic obstructive pulmonary disease (HCC)   Hemoptysis in the setting of bronchiectasis/ chronic lung injury  Worsening hemoptysis earlier this am. Appreciate cardiothoracic surgery input.  Mild acute on chronic respiratory failure with hypoxia CT of the chest reviewed, COVID-19 rapid test negative.  IR consulted and he underwent thoracic segmental artery angiogram / bronchial artery angiogram with embolization of the right bronchial arteries. Marland Kitchen  PCCM consulted and recommendations given. Plan for bronchoscopy later today. Pt reports no hemoptysis today. He wishes to go home after bronchoscopy.  Resume IV azithromycin and inhaled tobramycin. Resume bronchodilators.    Peripheral arterial disease holding aspirin and Pletal for hemoptysis. ? Restart aspirin and pletal post bronchoscopy.     COPD No wheezing heard continue with bronchodilators.   Essential hypertension Blood pressure parameters are good.   Anemia of blood loss from hemoptysis superimposed on anemia of chronic disease:  Hemoglobin stable around 9. Repeat H&H today.    DVT prophylaxis: SCDs Code Status: Full code Family Communication: None at bedside , updated wife over the phone.  disposition Plan: Pending clinical improvement and further management.    Consultants:   PCCM  Cardiothoracic surgery  IR  Procedures: None Antimicrobials: Azithromycin and inhaled tobramycin  Subjective: No hemoptysis today.   Objective: Vitals:   07/27/18 0808 07/27/18 0809 07/27/18 0819 07/27/18 1201  BP:   (!) 144/54 (!) 140/109  Pulse:   60 65  Resp:   19 18  Temp:   97.8 F (36.6 C) 97.8 F (36.6 C)  TempSrc:      SpO2: 97% 97% 98% 96%  Weight:      Height:        Intake/Output Summary (Last 24 hours) at 07/27/2018 1317 Last data filed at 07/27/2018 0600 Gross per 24 hour  Intake 1347.5 ml  Output 150 ml  Net 1197.5 ml   Filed Weights   07/24/18 1642 07/25/18 1424  Weight: 72.6 kg 66.5 kg    Examination:  General exam: not in distress,  on 3l it of [place] oxygen.  Respiratory system: shallow inspiration, coughing, no wheezing heard.  Cardiovascular system: S1 & S2 heard, RRR. No JVD,  Gastrointestinal system: Abdomen is soft non tender non distended bowel sounds good.  Central nervous system: Alert and oriented. No focal neurological deficits. Extremities: Symmetric 5 x 5 power. Skin: No rashes, lesions or ulcers  Psychiatry: Mood & affect appropriate.     Data Reviewed: I have personally reviewed following labs and imaging studies  CBC: Recent Labs  Lab 07/24/18 1800 07/24/18 2135 07/25/18 0233 07/25/18 0520 07/26/18 0606  WBC 13.8* 12.7* 9.0 7.5 7.2  NEUTROABS 11.3*  --   --   --   --   HGB 8.8* 8.8* 8.2* 8.5* 9.0*  HCT 30.0* 29.7* 28.1* 28.8* 30.1*  MCV 90.6 89.7 91.2  89.4 89.1  PLT 356 368 336 353 352   Basic Metabolic Panel: Recent Labs  Lab 07/24/18 1800 07/26/18 0606 07/27/18 1021  NA 137 141 140  K 4.4 4.1 3.5  CL 101 104 104  CO2 28 30 26   GLUCOSE 100* 83 94  BUN 17 12 12   CREATININE 0.98 0.99 1.12  CALCIUM 8.8* 9.0 9.1   GFR: Estimated Creatinine Clearance: 52.8 mL/min (by C-G formula based on SCr of 1.12 mg/dL). Liver Function Tests: Recent Labs  Lab 07/24/18 1800  AST 19  ALT 16  ALKPHOS 75  BILITOT 0.5  PROT 7.0  ALBUMIN 2.8*   No results for input(s): LIPASE, AMYLASE in the last 168 hours. No results for input(s): AMMONIA in the last 168 hours. Coagulation Profile: Recent Labs  Lab 07/24/18 2135  INR 1.1   Cardiac Enzymes: No results for input(s): CKTOTAL, CKMB, CKMBINDEX, TROPONINI in the last 168 hours. BNP (last 3 results) No results for input(s): PROBNP in the last 8760 hours. HbA1C: No results for input(s): HGBA1C in the last 72 hours. CBG: No results for input(s): GLUCAP in the last 168 hours. Lipid Profile: No results for input(s): CHOL, HDL, LDLCALC, TRIG, CHOLHDL, LDLDIRECT in the last 72 hours. Thyroid Function Tests: No results for input(s): TSH, T4TOTAL, FREET4, T3FREE, THYROIDAB in the last 72 hours. Anemia Panel: No results for input(s): VITAMINB12, FOLATE, FERRITIN, TIBC, IRON, RETICCTPCT in the last 72 hours. Sepsis Labs: No results for input(s): PROCALCITON, LATICACIDVEN in the last 168 hours.  Recent Results (from the past 240 hour(s))  SARS Coronavirus 2     Status: None   Collection Time: 07/24/18  7:32 PM  Result Value Ref Range Status   SARS Coronavirus 2 NOT DETECTED NOT DETECTED Final    Comment: (NOTE) SARS-CoV-2 target nucleic acids are NOT DETECTED. The SARS-CoV-2 RNA is generally detectable in upper and lower respiratory specimens during the acute phase of infection.  Negative  results do not preclude SARS-CoV-2 infection, do not rule out co-infections with other pathogens,  and should not be used as the sole basis for treatment or other patient management decisions.  Negative results must be combined with clinical observations, patient history, and epidemiological information. The expected result is Not Detected. Fact Sheet for Patients: http://www.biofiredefense.com/wp-content/uploads/2020/03/BIOFIRE-COVID -19-patients.pdf Fact Sheet for Healthcare Providers: http://www.biofiredefense.com/wp-content/uploads/2020/03/BIOFIRE-COVID -19-hcp.pdf This test is not yet approved or cleared by the [place] and  has been authorized for detection and/or diagnosis of SARS-CoV-2 by FDA under an Emergency Use Authorization (EUA).  This EUA will remain in effec t (meaning this test can be used) for the duration of  the COVID-19 declaration under Section 564(b)(1) of the Act, 21 U.S.C. section 360bbb-3(b)(1), unless the authorization is terminated or revoked sooner. Performed at [facility], [address] [place], [place] [address]          Radiology Studies: Ir Angiogram Selective Each Additional Vessel  Result Date: 07/27/2018 INDICATION: 78 year old male with a history of hemoptysis, presumably right-sided at site of worst bronchiectasis and CT changes  EXAM: ULTRASOUND-GUIDED RIGHT COMMON FEMORAL ARTERY ACCESS BRONCHIAL ARTERY ANGIOGRAM EMBOLIZATION OF PATHOLOGIC RIGHT BRONCHIAL ARTERIES WITH EMBOSPHERES MEDICATIONS: None ANESTHESIA/SEDATION: Moderate (conscious) sedation was employed during this procedure. A total of Versed 3.0 mg and Fentanyl 25 mcg was administered intravenously. Moderate Sedation Time: 59 minutes. The patient's level of consciousness and vital signs were monitored continuously by radiology nursing throughout the procedure under my direct supervision. CONTRAST:  100 cc FLUOROSCOPY TIME:  Fluoroscopy Time: 20 minutes 0 seconds (382 mGy). COMPLICATIONS: None PROCEDURE: Informed consent was obtained from the patient following  explanation of the procedure, risks, benefits and alternatives. The patient understands, agrees and consents for the procedure. All questions were addressed. A time out was performed prior to the initiation of the procedure. Maximal barrier sterile technique utilized including caps, mask, sterile gowns, sterile gloves, large sterile drape, hand hygiene, and Betadine prep. Ultrasound survey of the right inguinal region was performed with images stored and sent to PACs, confirming patency of the vessel. A micropuncture needle was used access the right common femoral artery under ultrasound. With excellent arterial blood flow returned, and an .018 micro wire was passed through the needle, observed enter the abdominal aorta under fluoroscopy. The needle was removed, and a micropuncture sheath was placed over the wire. The inner dilator and wire were removed, and an 035 Bentson wire was advanced under fluoroscopy into the iliac artery. Atherosclerotic changes impeded advancement into the aorta. Five French sheath was placed. The dilator was removed and the sheath was flushed. Standard Glidewire and angled MPA catheter used to navigate the Glidewire into the aorta. With the catheter in the abdominal aorta, Bentson wire was replaced. MPA catheter was removed. Mickelson catheter passed over the Bentson wire. We proceeded with segmental angiogram/bronchial artery angiogram. Of the 3 or 4 origins suggested on prior CT, 2 of these were identified. The lower of which was small caliber and would not except the tip of the catheter, not enlarged. The most superior bronchial artery also had the origin of the supreme intercostal artery proximal to the right bronchial arteries. With the tip of the Mickelson catheter at the origin, we attempted passage of the microcatheter first with 014 fathom wire, then with 018 fathom wire. The 018 fathom wire was more useful in achieving purchase into the bronchial arteries, and we passed the 025  penumbra lantern catheter beyond the origin of the supra mentor costal artery. Angiogram was performed. We then performed embolization to stasis with 300-500 microspheres. One vial was used. Repeat angiogram was performed. The patient's mental status during the case was most likely related to a paradoxical reaction to the moderate sedation, and during the case restraints or in fluid for safety. After these arteries were embolized we withdrew from the case for safety. All catheters wires were removed. Exoseal was deployed at the right common femoral artery for hemostasis. Patient remained hemodynamically stable throughout. No complications were encountered and no significant blood loss. During the case the patient experienced paradoxical reaction to the moderate sedation, with deteriorating safety and employed restraints during the case. FINDINGS: Significant atherosclerotic changes of the iliac arteries, requiring precise Glidewire navigation for placement of the initial access. Two of the 3 bronchial arteries were identified. The bronchial artery that was engaged and treated corresponds to the origin/findings on the recent CT chest (image 97 series 8). These arteries were embolized to stasis beyond the origin of the supreme intercostal artery. Stasis was achieved in the bronchial arteries. Final angiogram of the supreme inter costal  demonstrates no effects of embolization/non target embolization IMPRESSION: Status post ultrasound guided access right common femoral artery for bronchial artery angiogram and embolization of the most superior bronchial arteries, clearly pathologic on the angiogram. Signed, Yvone Neu. Reyne Dumas, RPVI [facility] Inter[facility] Radiology Special[facility] Radiology Electronically Signed   By: Gilmer Mor D.O.   On: 07/27/2018 07:38   Ir Angiogram Selective Each Additional Vessel  Result Date: 07/27/2018 INDICATION: 78 year old male with a history of hemoptysis, presumably  right-sided at site of worst bronchiectasis and CT changes EXAM: ULTRASOUND-GUIDED RIGHT COMMON FEMORAL ARTERY ACCESS BRONCHIAL ARTERY ANGIOGRAM EMBOLIZATION OF PATHOLOGIC RIGHT BRONCHIAL ARTERIES WITH EMBOSPHERES MEDICATIONS: None ANESTHESIA/SEDATION: Moderate (conscious) sedation was employed during this procedure. A total of Versed 3.0 mg and Fentanyl 25 mcg was administered intravenously. Moderate Sedation Time: 59 minutes. The patient's level of consciousness and vital signs were monitored continuously by radiology nursing throughout the procedure under my direct supervision. CONTRAST:  100 cc FLUOROSCOPY TIME:  Fluoroscopy Time: 20 minutes 0 seconds (382 mGy). COMPLICATIONS: None PROCEDURE: Informed consent was obtained from the patient following explanation of the procedure, risks, benefits and alternatives. The patient understands, agrees and consents for the procedure. All questions were addressed. A time out was performed prior to the initiation of the procedure. Maximal barrier sterile technique utilized including caps, mask, sterile gowns, sterile gloves, large sterile drape, hand hygiene, and Betadine prep. Ultrasound survey of the right inguinal region was performed with images stored and sent to PACs, confirming patency of the vessel. A micropuncture needle was used access the right common femoral artery under ultrasound. With excellent arterial blood flow returned, and an .018 micro wire was passed through the needle, observed enter the abdominal aorta under fluoroscopy. The needle was removed, and a micropuncture sheath was placed over the wire. The inner dilator and wire were removed, and an 035 Bentson wire was advanced under fluoroscopy into the iliac artery. Atherosclerotic changes impeded advancement into the aorta. Five French sheath was placed. The dilator was removed and the sheath was flushed. Standard Glidewire and angled MPA catheter used to navigate the Glidewire into the aorta. With the  catheter in the abdominal aorta, Bentson wire was replaced. MPA catheter was removed. Mickelson catheter passed over the Bentson wire. We proceeded with segmental angiogram/bronchial artery angiogram. Of the 3 or 4 origins suggested on prior CT, 2 of these were identified. The lower of which was small caliber and would not except the tip of the catheter, not enlarged. The most superior bronchial artery also had the origin of the supreme intercostal artery proximal to the right bronchial arteries. With the tip of the Mickelson catheter at the origin, we attempted passage of the microcatheter first with 014 fathom wire, then with 018 fathom wire. The 018 fathom wire was more useful in achieving purchase into the bronchial arteries, and we passed the 025 penumbra lantern catheter beyond the origin of the supra mentor costal artery. Angiogram was performed. We then performed embolization to stasis with 300-500 microspheres. One vial was used. Repeat angiogram was performed. The patient's mental status during the case was most likely related to a paradoxical reaction to the moderate sedation, and during the case restraints or in fluid for safety. After these arteries were embolized we withdrew from the case for safety. All catheters wires were removed. Exoseal was deployed at the right common femoral artery for hemostasis. Patient remained hemodynamically stable throughout. No complications were encountered and no significant blood loss. During the case the patient  experienced paradoxical reaction to the moderate sedation, with deteriorating safety and employed restraints during the case. FINDINGS: Significant atherosclerotic changes of the iliac arteries, requiring precise Glidewire navigation for placement of the initial access. Two of the 3 bronchial arteries were identified. The bronchial artery that was engaged and treated corresponds to the origin/findings on the recent CT chest (image 97 series 8). These arteries  were embolized to stasis beyond the origin of the supreme intercostal artery. Stasis was achieved in the bronchial arteries. Final angiogram of the supreme inter costal demonstrates no effects of embolization/non target embolization IMPRESSION: Status post ultrasound guided access right common femoral artery for bronchial artery angiogram and embolization of the most superior bronchial arteries, clearly pathologic on the angiogram. Signed, Yvone Neu. Reyne Dumas, RPVI [facility] and Interventional Radiology Specialists [facility] Rad[facility] Electronically Signed   By: Gilmer Mor D.O.   On: 07/27/2018 07:38   Ir Angiogram Selective Each Additional Vessel  Result Date: 07/27/2018 INDICATION: 78 year old male with a history of hemoptysis, presumably right-sided at site of worst bronchiectasis and CT changes EXAM: ULTRASOUND-GUIDED RIGHT COMMON FEMORAL ARTERY ACCESS BRONCHIAL ARTERY ANGIOGRAM EMBOLIZATION OF PATHOLOGIC RIGHT BRONCHIAL ARTERIES WITH EMBOSPHERES MEDICATIONS: None ANESTHESIA/SEDATION: Moderate (conscious) sedation was employed during this procedure. A total of Versed 3.0 mg and Fentanyl 25 mcg was administered intravenously. Moderate Sedation Time: 59 minutes. The patient's level of consciousness and vital signs were monitored continuously by radiology nursing throughout the procedure under my direct supervision. CONTRAST:  100 cc FLUOROSCOPY TIME:  Fluoroscopy Time: 20 minutes 0 seconds (382 mGy). COMPLICATIONS: None PROCEDURE: Informed consent was obtained from the patient following explanation of the procedure, risks, benefits and alternatives. The patient understands, agrees and consents for the procedure. All questions were addressed. A time out was performed prior to the initiation of the procedure. Maximal barrier sterile technique utilized including caps, mask, sterile gowns, sterile gloves, large sterile drape, hand hygiene, and Betadine prep. Ultrasound survey of the right inguinal region was  performed with images stored and sent to PACs, confirming patency of the vessel. A micropuncture needle was used access the right common femoral artery under ultrasound. With excellent arterial blood flow returned, and an .018 micro wire was passed through the needle, observed enter the abdominal aorta under fluoroscopy. The needle was removed, and a micropuncture sheath was placed over the wire. The inner dilator and wire were removed, and an 035 Bentson wire was advanced under fluoroscopy into the iliac artery. Atherosclerotic changes impeded advancement into the aorta. Five French sheath was placed. The dilator was removed and the sheath was flushed. Standard Glidewire and angled MPA catheter used to navigate the Glidewire into the aorta. With the catheter in the abdominal aorta, Bentson wire was replaced. MPA catheter was removed. Mickelson catheter passed over the Bentson wire. We proceeded with segmental angiogram/bronchial artery angiogram. Of the 3 or 4 origins suggested on prior CT, 2 of these were identified. The lower of which was small caliber and would not except the tip of the catheter, not enlarged. The most superior bronchial artery also had the origin of the supreme intercostal artery proximal to the right bronchial arteries. With the tip of the Mickelson catheter at the origin, we attempted passage of the microcatheter first with 014 fathom wire, then with 018 fathom wire. The 018 fathom wire was more useful in achieving purchase into the bronchial arteries, and we passed the 025 penumbra lantern catheter beyond the origin of the supra mentor costal artery. Angiogram was performed. We then  performed embolization to stasis with 300-500 microspheres. One vial was used. Repeat angiogram was performed. The patient's mental status during the case was most likely related to a paradoxical reaction to the moderate sedation, and during the case restraints or in fluid for safety. After these arteries were  embolized we withdrew from the case for safety. All catheters wires were removed. Exoseal was deployed at the right common femoral artery for hemostasis. Patient remained hemodynamically stable throughout. No complications were encountered and no significant blood loss. During the case the patient experienced paradoxical reaction to the moderate sedation, with deteriorating safety and employed restraints during the case. FINDINGS: Significant atherosclerotic changes of the iliac arteries, requiring precise Glidewire navigation for placement of the initial access. Two of the 3 bronchial arteries were identified. The bronchial artery that was engaged and treated corresponds to the origin/findings on the recent CT chest (image 97 series 8). These arteries were embolized to stasis beyond the origin of the supreme intercostal artery. Stasis was achieved in the bronchial arteries. Final angiogram of the supreme inter costal demonstrates no effects of embolization/non target embolization IMPRESSION: Status post ultrasound guided access right common femoral artery for bronchial artery angiogram and embolization of the most superior bronchial arteries, clearly pathologic on the angiogram. Signed, Yvone Neu. Reyne Dumas, RPVI Vascular and Interventional Radiology Specialists [facility] Radiology Electronically Signed   By: Gilmer Mor D.O.   On: 07/27/2018 07:38   Ir US Guide Vasc Access Right  Result Date: 07/27/2018 INDICATION: 78 year old male with a history of hemoptysis, presumably right-sided at site of worst bronchiectasis and CT changes EXAM: ULTRASOUND-GUIDED RIGHT COMMON FEMORAL ARTERY ACCESS BRONCHIAL ARTERY ANGIOGRAM EMBOLIZATION OF PATHOLOGIC RIGHT BRONCHIAL ARTERIES WITH EMBOSPHERES MEDICATIONS: None ANESTHESIA/SEDATION: Moderate (conscious) sedation was employed during this procedure. A total of Versed 3.0 mg and Fentanyl 25 mcg was administered intravenously. Moderate Sedation Time: 59 minutes. The  patient's level of consciousness and vital signs were monitored continuously by radiology nursing throughout the procedure under my direct supervision. CONTRAST:  100 cc FLUOROSCOPY TIME:  Fluoroscopy Time: 20 minutes 0 seconds (382 mGy). COMPLICATIONS: None PROCEDURE: Informed consent was obtained from the patient following explanation of the procedure, risks, benefits and alternatives. The patient understands, agrees and consents for the procedure. All questions were addressed. A time out was performed prior to the initiation of the procedure. Maximal barrier sterile technique utilized including caps, mask, sterile gowns, sterile gloves, large sterile drape, hand hygiene, and Betadine prep. Ultrasound survey of the right inguinal region was performed with images stored and sent to PACs, confirming patency of the vessel. A micropuncture needle was used access the right common femoral artery under ultrasound. With excellent arterial blood flow returned, and an .018 micro wire was passed through the needle, observed enter the abdominal aorta under fluoroscopy. The needle was removed, and a micropuncture sheath was placed over the wire. The inner dilator and wire were removed, and an 035 Bentson wire was advanced under fluoroscopy into the iliac artery. Atherosclerotic changes impeded advancement into the aorta. Five French sheath was placed. The dilator was removed and the sheath was flushed. Standard Glidewire and angled MPA catheter used to navigate the Glidewire into the aorta. With the catheter in the abdominal aorta, Bentson wire was replaced. MPA catheter was removed. Mickelson catheter passed over the Bentson wire. We proceeded with segmental angiogram/bronchial artery angiogram. Of the 3 or 4 origins suggested on prior CT, 2 of these were identified. The lower of which was small caliber and would  not except the tip of the catheter, not enlarged. The most superior bronchial artery also had the origin of the  supreme intercostal artery proximal to the right bronchial arteries. With the tip of the Mickelson catheter at the origin, we attempted passage of the microcatheter first with 014 fathom wire, then with 018 fathom wire. The 018 fathom wire was more useful in achieving purchase into the bronchial arteries, and we passed the 025 penumbra lantern catheter beyond the origin of the supra mentor costal artery. Angiogram was performed. We then performed embolization to stasis with 300-500 microspheres. One vial was used. Repeat angiogram was performed. The patient's mental status during the case was most likely related to a paradoxical reaction to the moderate sedation, and during the case restraints or in fluid for safety. After these arteries were embolized we withdrew from the case for safety. All catheters wires were removed. Exoseal was deployed at the right common femoral artery for hemostasis. Patient remained hemodynamically stable throughout. No complications were encountered and no significant blood loss. During the case the patient experienced paradoxical reaction to the moderate sedation, with deteriorating safety and employed restraints during the case. FINDINGS: Significant atherosclerotic changes of the iliac arteries, requiring precise Glidewire navigation for placement of the initial access. Two of the 3 bronchial arteries were identified. The bronchial artery that was engaged and treated corresponds to the origin/findings on the recent CT chest (image 97 series 8). These arteries were embolized to stasis beyond the origin of the supreme intercostal artery. Stasis was achieved in the bronchial arteries. Final angiogram of the supreme inter costal demonstrates no effects of embolization/non target embolization IMPRESSION: Status post ultrasound guided access right common femoral artery for bronchial artery angiogram and embolization of the most superior bronchial arteries, clearly pathologic on the  angiogram. Signed, Yvone Neu. Reyne Dumas, RPVI [facility] Electronically Signed   By: Gilmer Mor D.O.   On: 07/27/2018 07:38   Ct Angio Chest Aorta W/cm &/or Wo/cm  Result Date: 07/26/2018 CLINICAL DATA:  78 year old male with a history of hemoptysis EXAM: CT ANGIOGRAPHY CHEST WITH CONTRAST TECHNIQUE: Multidetector CT imaging of the chest was performed using the standard protocol during bolus administration of intravenous contrast. Multiplanar CT image reconstructions and MIPs were obtained to evaluate the vascular anatomy. CONTRAST:  OMNIPAQUE IOHEXOL 350 MG/ML SOLN COMPARISON:  07/07/2018, 09/21/2016, 06/21/2016 FINDINGS: Cardiovascular: Heart: No cardiomegaly. No pericardial fluid/thickening. Calcifications of left main, left anterior descending, circumflex, right coronary arteries. Aorta: Ascending aorta measures 3.4 cm. No dissection. No periaortic fluid/inflammation. Mild atherosclerosis of the thoracic aorta. Greatest diameter at the hiatus measures 2.9 cm. Three vessel arch with minimal atherosclerosis at the origins. Cervical arteries at the base of the neck are patent. Bilateral axillary arteries are patent. The CT angiogram demonstrate 4 bronchial arteries/bronchial artery origins. There is no significant hypertrophy or appreciable tortuosity of any of the bronchial arteries. Pulmonary arteries: Main pulmonary artery measures 3.2 cm. There is relative enlargement of the pulmonary arteries as they extend toward the outer 1/3 of the lung, relatively larger than the accompanying bronchi. No filling defects of the pulmonary arteries. Mediastinum/Nodes: Redemonstration multiple lymph nodes of the mediastinal nodal stations. The largest of the lowest paratracheal nodes are unchanged from the comparison CT. Subcarinal nodes remain enlarged measuring 17 mm-18 mm. Unremarkable appearance of the thoracic esophagus. Unremarkable thoracic inlet.  Lungs/Pleura: Compared to the recent CT of 07/07/2018 and the remote CT of 09/21/2016, there has been improved aeration  of the right lower lobe. Redemonstration of bronchiectasis of the right lower lobe, with some bronchi occluded with debris. There is associated partial volume loss in linear configuration along the bronchial vasculature. No evidence of extravasated contrast indicate active bleeding. Redemonstration of centrilobular nodularity throughout the bilateral lungs. Regions of nodularity improved from the remote CT of 2018, particularly of the lower lobes,. No pneumothorax or pleural effusion. Upper Abdomen: No acute finding of the upper abdomen. Musculoskeletal: No acute displaced fracture. Degenerative changes of the spine. Review of the MIP images confirms the above findings. IMPRESSION: No acute arterial abnormality. Small bronchial arteries identified without significant tortuosity or engorgement. If the patient's reported hemoptysis is originating from bronchial artery source, the most likely site would be the right lower lobe, though bronchoscopy may be useful for confirming. Improving aeration of the right lower lobe, with redemonstration of advanced bronchiectasis. Multiple bronchi of the right lower lobe demonstrate partial occlusion with debris/fluid, potentially blood products, or sequela of aspiration. Redemonstration of centrilobular nodularity of the bilateral lungs, relatively similar in distribution to the CT of 07/07/2018, perhaps slightly improved. Overall there is significant improvement to the prior CT of 2018. While chronic infection as previously discussed such as MAC would be leading differential, follow-up CT once the patient has been treated/recovered may be useful to assure stability, particularly of the lingular nodularity. Mediastinal adenopathy persists, likely reactive. Aortic Atherosclerosis (ICD10-I70.0). Electronically Signed   By: Gilmer Mor D.O.   On: 07/26/2018 14:51    Ir Embo Art  Peter [facility] Hemorr Lymph [facility]  [employer] Guide Roadmapping  Result Date: 07/27/2018 INDICATION: 78 year old male with a history of hemoptysis, presumably right-sided at site of worst bronchiectasis and CT changes EXAM: ULTRASOUND-GUIDED RIGHT COMMON FEMORAL ARTERY ACCESS BRONCHIAL ARTERY ANGIOGRAM EMBOLIZATION OF PATHOLOGIC RIGHT BRONCHIAL ARTERIES WITH EMBOSPHERES MEDICATIONS: None ANESTHESIA/SEDATION: Moderate (conscious) sedation was employed during this procedure. A total of Versed 3.0 mg and Fentanyl 25 mcg was administered intravenously. Moderate Sedation Time: 59 minutes. The patient's level of consciousness and vital signs were monitored continuously by radiology nursing throughout the procedure under my direct supervision. CONTRAST:  100 cc FLUOROSCOPY TIME:  Fluoroscopy Time: 20 minutes 0 seconds (382 mGy). COMPLICATIONS: None PROCEDURE: Informed consent was obtained from the patient following explanation of the procedure, risks, benefits and alternatives. The patient understands, agrees and consents for the procedure. All questions were addressed. A time out was performed prior to the initiation of the procedure. Maximal barrier sterile technique utilized including caps, mask, sterile gowns, sterile gloves, large sterile drape, hand hygiene, and Betadine prep. Ultrasound survey of the right inguinal region was performed with images stored and sent to PACs, confirming patency of the vessel. A micropuncture needle was used access the right common femoral artery under ultrasound. With excellent arterial blood flow returned, and an .018 micro wire was passed through the needle, observed enter the abdominal aorta under fluoroscopy. The needle was removed, and a micropuncture sheath was placed over the wire. The inner dilator and wire were removed, and an 035 Bentson wire was advanced under fluoroscopy into the iliac artery. Atherosclerotic changes impeded advancement into the aorta. Five French sheath  was placed. The dilator was removed and the sheath was flushed. Standard Glidewire and angled MPA catheter used to navigate the Glidewire into the aorta. With the catheter in the abdominal aorta, Bentson wire was replaced. MPA catheter was removed. Mickelson catheter passed over the Bentson wire. We proceeded with segmental angiogram/bronchial artery angiogram. Of the 3 or 4 origins suggested  on prior CT, 2 of these were identified. The lower of which was small caliber and would not except the tip of the catheter, not enlarged. The most superior bronchial artery also had the origin of the supreme intercostal artery proximal to the right bronchial arteries. With the tip of the Mickelson catheter at the origin, we attempted passage of the microcatheter first with 014 fathom wire, then with 018 fathom wire. The 018 fathom wire was more useful in achieving purchase into the bronchial arteries, and we passed the 025 penumbra lantern catheter beyond the origin of the supra mentor costal artery. Angiogram was performed. We then performed embolization to stasis with 300-500 microspheres. One vial was used. Repeat angiogram was performed. The patient's mental status during the case was most likely related to a paradoxical reaction to the moderate sedation, and during the case restraints or in fluid for safety. After these arteries were embolized we withdrew from the case for safety. All catheters wires were removed. Exoseal was deployed at the right common femoral artery for hemostasis. Patient remained hemodynamically stable throughout. No complications were encountered and no significant blood loss. During the case the patient experienced paradoxical reaction to the moderate sedation, with deteriorating safety and employed restraints during the case. FINDINGS: Significant atherosclerotic changes of the iliac arteries, requiring precise Glidewire navigation for placement of the initial access. Two of the 3 bronchial  arteries were identified. The bronchial artery that was engaged and treated corresponds to the origin/findings on the recent CT chest (image 97 series 8). These arteries were embolized to stasis beyond the origin of the supreme intercostal artery. Stasis was achieved in the bronchial arteries. Final angiogram of the supreme inter costal demonstrates no effects of embolization/non target embolization IMPRESSION: Status post ultrasound guided access right common femoral artery for bronchial artery angiogram and embolization of the most superior bronchial arteries, clearly pathologic on the angiogram. Signed, Yvone Neu. Reyne Dumas, RPVI [facility] Radiology Electronically Signed   By: Gilmer Mor D.O.   On: 07/27/2018 07:38        Scheduled Meds:  [ZOX Hold] atorvastatin  20 mg Oral QHS   [MAR Hold] gabapentin  300 mg Oral QHS   [MAR Hold] HYDROcodone-acetaminophen  1 tablet Oral QHS   [MAR Hold] HYDROcodone-acetaminophen  2 tablet Oral Daily   [MAR Hold] mometasone-formoterol  2 puff Inhalation BID   [MAR Hold] multivitamin with minerals  1 tablet Oral Daily   [MAR Hold] sertraline  200 mg Oral Daily   [MAR Hold] tobramycin (PF)  300 mg Nebulization BID   [MAR Hold] traZODone  50 mg Oral QHS   [MAR Hold] umeclidinium bromide  1 puff Inhalation Daily   [MAR Hold] vitamin B-12  1,000 mcg Oral Daily   Continuous Infusions:  sodium chloride 75 mL/hr at 07/26/18 1047   [MAR Hold] azithromycin Stopped (07/26/18 2200)   lactated ringers 10 mL/hr at 07/27/18 1239     LOS: 2 days    Time spent: 36 minutes.     Kathlen Mody, MD [facility] Pager [phone]   If 7PM-7AM, please contact night-coverage [email or web address] Password [email or web address] 07/27/2018, 1:17 PM

## 2018-07-27 NOTE — Interval H&P Note (Signed)
PCCM Interval   Discussed procedure, plans with the patient. He understands risks, benefits.   After discussion with Dr Guadlupe Spanish w anesthesia, we will proceed under general anesthesia.   The patient is hoping that he can be discharged to home later today. This is not unreasonable as long as the procedure goes smoothly and there are no lingering issues per [facility] Internal medicine.   Baltazar Apo, MD, PhD 07/27/2018, 12:57 PM [facility] [facility] [phone] or if no answer [phone]

## 2018-07-27 NOTE — Anesthesia Procedure Notes (Signed)
Procedure Name: Intubation Date/Time: 07/27/2018 1:15 PM Performed by: Kyung Rudd, CRNA Pre-anesthesia Checklist: Patient identified, Emergency Drugs available, Suction available and Patient being monitored Patient Re-evaluated:Patient Re-evaluated prior to induction Oxygen Delivery Method: Circle system utilized Preoxygenation: Pre-oxygenation with 100% oxygen Induction Type: IV induction and Rapid sequence Laryngoscope Size: Mac and 4 Grade View: Grade I Tube type: Oral Tube size: 8.5 mm Number of attempts: 1 Airway Equipment and Method: Stylet Placement Confirmation: ETT inserted through vocal cords under direct vision,  positive ETCO2 and breath sounds checked- equal and bilateral Secured at: 21 cm Tube secured with: Tape Dental Injury: Teeth and Oropharynx as per pre-operative assessment

## 2018-07-27 NOTE — Transfer of Care (Signed)
Immediate Anesthesia Transfer of Care Note  Patient: William Kirk  Procedure(s) Performed: VIDEO BRONCHOSCOPY WITHOUT FLUORO (N/A )  Patient Location: PACU  Anesthesia Type:General  Level of Consciousness: awake, alert  and oriented  Airway & Oxygen Therapy: Patient Spontanous Breathing and Patient connected to nasal cannula oxygen  Post-op Assessment: Report given to RN, Post -op Vital signs reviewed and stable and Patient moving all extremities X 4  Post vital signs: Reviewed and stable  Last Vitals:  Vitals Value Taken Time  BP 129/53 07/27/18 1353  Temp 36.7 C 07/27/18 1350  Pulse 62 07/27/18 1358  Resp 17 07/27/18 1358  SpO2 97 % 07/27/18 1358  Vitals shown include unvalidated device data.  Last Pain:  Vitals:   07/27/18 1350  TempSrc:   PainSc: 0-No pain         Complications: No apparent anesthesia complications

## 2018-07-27 NOTE — Op Note (Signed)
Video Bronchoscopy Procedure Note  Date of Operation: 07/27/2018  Pre-op Diagnosis:   Post-op Diagnosis: Same  Surgeon: Baltazar Apo  Assistants: none  Anesthesia: General anesthesia with endotracheal intubation  Operation: Flexible video fiberoptic bronchoscopy with BAL.  Estimated Blood Loss: 1-2 cc  Complications: none noted  Indications and History: William Kirk is 78 y.o. 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  He has been under evaluation most recently for bronchiectasis flare and persistent hemoptysis.  He underwent bronchial arterial embolectomy on 6/18.  Today we plan for inspection bronchoscopy to ensure no discrete lesion that would be responsible for bleeding. The risks, benefits, complications, treatment options and expected outcomes were discussed with the patient.  The possibilities of pneumothorax, pneumonia, reaction to medication, pulmonary aspiration, perforation of a viscus, bleeding, failure to diagnose a condition and creating a complication requiring transfusion or operation were discussed with the patient who freely signed the consent.    Description of Procedure: The patient was seen in the Preoperative Area, was examined and was deemed appropriate to proceed.  The patient was taken to OR 10, identified as William Kirk and the procedure verified as Flexible Video Fiberoptic Bronchoscopy.  A Time Out was held and the above information confirmed.   General anesthesia was initiated and the patient was endotracheally intubated.  The video fiberoptic bronchoscope was introduced via the ET tube and a general inspection was performed.   There were yellow/tan secretions noted all of the right-sided airways, most prominently in the right lower lobe airways.  There were some secretions also noted on the left side but not as prominent.  No endobronchial lesion was seen, no evidence of malignancy.  There was only scant  blood noted from the basilar inferior segments of the right lower lobe.  Scope trauma, manipulation and suctioning did result in very mild oozing from this area which stopped quickly.  There was no other blood seen on the airway exam.  Secretions were suctioned from the right lower lobe airway to be sent for culture.  Then a dedicated BAL was performed in the right middle lobe with 60 cc of normal saline instilled and approximately 25 cc returned.  This will also be sent for microbiology.  The patient tolerated the procedure well. The bronchoscope was removed. There were no obvious complications.   Samples: 1.  Respiratory culture on secretions from right lower lobe 2.  BAL from the right middle lobe  Plans:  We will review the microbiology results with the patient when they become available.  Outpatient followup will be with Dr Gwenette Greet at the [facility].  I will discuss the results with him.Baltazar Apo, MD, PhD 07/27/2018, 1:34 PM [place] [facility] [phone] or if no answer [phone]

## 2018-07-27 NOTE — Anesthesia Preprocedure Evaluation (Addendum)
Anesthesia Evaluation  Patient identified by MRN, date of birth, ID band Patient awake    Reviewed: Allergy & Precautions, H&P , NPO status , Patient's Chart, lab work & pertinent test results  Airway Mallampati: I       Dental no notable dental hx.    Pulmonary COPD,  COPD inhaler, former smoker,    Pulmonary exam normal        Cardiovascular hypertension, Pt. on medications Normal cardiovascular exam Rhythm:Regular Rate:Normal     Neuro/Psych Depression negative neurological ROS     GI/Hepatic Neg liver ROS, PUD,   Endo/Other  negative endocrine ROS  Renal/GU negative Renal ROS  negative genitourinary   Musculoskeletal  (+) Arthritis , Osteoarthritis,    Abdominal Normal abdominal exam  (+)   Peds  Hematology negative hematology ROS (+)   Anesthesia Other Findings   Reproductive/Obstetrics negative OB ROS                            Anesthesia Physical Anesthesia Plan  ASA: III  Anesthesia Plan: General   Post-op Pain Management:    Induction: Intravenous  PONV Risk Score and Plan: 2 and Ondansetron, Dexamethasone and Midazolam  Airway Management Planned: Oral ETT  Additional Equipment:   Intra-op Plan:   Post-operative Plan: Extubation in OR  Informed Consent: I have reviewed the patients History and Physical, chart, labs and discussed the procedure including the risks, benefits and alternatives for the proposed anesthesia with the patient or authorized representative who has indicated his/her understanding and acceptance.     Dental advisory given  Plan Discussed with: CRNA  Anesthesia Plan Comments:         Anesthesia Quick Evaluation

## 2018-07-28 LAB — CBC
HCT: 28.2 % — ABNORMAL LOW (ref 39.0–52.0)
Hemoglobin: 8.5 g/dL — ABNORMAL LOW (ref 13.0–17.0)
MCH: 27 pg (ref 26.0–34.0)
MCHC: 30.1 g/dL (ref 30.0–36.0)
MCV: 89.5 fL (ref 80.0–100.0)
Platelets: 337 10*3/uL (ref 150–400)
RBC: 3.15 MIL/uL — ABNORMAL LOW (ref 4.22–5.81)
RDW: 15.8 % — ABNORMAL HIGH (ref 11.5–15.5)
WBC: 13.4 10*3/uL — ABNORMAL HIGH (ref 4.0–10.5)
nRBC: 0 % (ref 0.0–0.2)

## 2018-07-28 LAB — ACID FAST SMEAR (AFB, MYCOBACTERIA)
Acid Fast Smear: NEGATIVE
Acid Fast Smear: NEGATIVE

## 2018-07-28 MED ORDER — TOBRAMYCIN 300 MG/5ML IN NEBU: 300.0000 mg | INHALATION_SOLUTION | Freq: Two times a day (BID) | RESPIRATORY_TRACT | 0 refills | Status: DC

## 2018-07-28 NOTE — Plan of Care (Signed)
  Problem: Health Behavior/Discharge Planning: Goal: Ability to manage health-related needs will improve Outcome: Adequate for Discharge   Problem: Clinical Measurements: Goal: Ability to maintain clinical measurements within normal limits will improve Outcome: Adequate for Discharge Goal: Will remain free from infection Outcome: Adequate for Discharge

## 2018-07-28 NOTE — Discharge Summary (Signed)
Physician Discharge Summary  William Kirk XBJ:[id number] DOB: 05-09-1940 DOA: 07/24/2018  PCP: [facility]  Admit date: 07/24/2018 Discharge date: 07/28/2018  Admitted From: home Disposition: home  Recommendations for Outpatient Follow-up:  1. Follow up with PCP in 1-2 weeks 2. Please obtain BMP/CBC in one week 3. [facility]   Home Health:none Equipment/Devices:none  Discharge Condition:stable CODE STATUS:full Diet recommendation:cardiac Brief/Interim Summary:78 y.o.malewith medical history significant ofCOPD, bronchiectasis, PVD, arthritis, depression, hypertension presenting to the hospital for evaluation of cough and hemoptysis. Patient was recently intubated and admitted to the ICU on Jul 07, 2018 for presumed CAP, bronchiectasis flare, andhemoptysis.He required intubation for the initial 24 hours. He was treated with 2 days of antibiotics which were discontinued once cultures were negative. Hemoptysis was thought to be secondary to bronchiectasis flare. CT did not demonstrate a focus and no bronchoscopy was done. He was discharged on June 3. Patient states he was doing okay since he left the hospital but then started coughing up blood again today.   Pt currently on 3 lit of  oxygen and PCCM consulted for further recommendations  Discharge Diagnoses:  Principal Problem:   Hemoptysis Active Problems:   Acute respiratory failure with hypoxia (HCC)   Bronchiectasis (HCC)   Depression   Chronic obstructive pulmonary disease (HCC)  Hemoptysis in the setting of bronchiectasis/ ch ronic lung injury -patient admitted with hemoptysis had bronc done 07/27/2018 by Dr. Delton Coombes had bronchial artery embolization.  Has not had any further bleeding since then.  Patient will need to follow-up with Dr. Shelle Iron at [facility].  Dr. Delton Coombes to call Dr. Shelle Iron with reports of the cultures.  Holding aspirin and Pletal per pulmonary for now.  COVID-19  rapid test was negative.  Patient received 5 days of azithromycin will be discharged on inhaled tobramycin. IR consulted and he underwent thoracic segmental artery angiogram / bronchial artery angiogram with embolization of the right bronchial arteries.   Peripheral arterial disease holding aspirin and Pletal for hemoptysis    COPD continue home bronchodilators.   Essential hypertension continue ACE inhibitor.  Anemia of blood loss from hemoptysis superimposed on anemia of chronic disease: Hemoglobin on the day of discharge 8.5.    Estimated body mass index is 21.04 kg/m as calculated from the following:   Height as of this encounter: 5\' 10"  (1.778 m).   Weight as of this encounter: 66.5 kg.  Discharge Instructions   Allergies as of 07/28/2018   No Known Allergies     Medication List    STOP taking these medications   aspirin EC 81 MG tablet   cilostazol 100 MG tablet Commonly known as: PLETAL     TAKE these medications   acetaminophen 650 MG CR tablet Commonly known as: TYLENOL Take 1 tablet (650 mg total) by mouth every 8 (eight) hours as needed for pain.   albuterol 108 (90 Base) MCG/ACT inhaler Commonly known as: VENTOLIN HFA Inhale 2 puffs into the lungs every 4 (four) hours as needed for wheezing or shortness of breath.   albuterol (2.5 MG/3ML) 0.083% nebulizer solution Commonly known as: PROVENTIL Take 3 mLs (2.5 mg total) by nebulization 2 (two) times a day for 30 days.   atorvastatin 20 MG tablet Commonly known as: LIPITOR Take 20 mg by mouth at bedtime.   budesonide-formoterol 160-4.5 MCG/ACT inhaler Commonly known as: SYMBICORT Inhale 2 puffs into the lungs 2 (two) times daily.   Ensure High Protein Liqd Take 237 mLs  by mouth See admin instructions. Drink one bottle (237 mls) by mouth up to 3 times daily as a meal supplement   gabapentin 300 MG capsule Commonly known as: NEURONTIN Take 300 mg by mouth at bedtime.    HYDROcodone-acetaminophen 10-325 MG tablet Commonly known as: NORCO Take 1-2 tablets by mouth See admin instructions. Take 2 tablets by mouth every morning and 1 tablet at night   losartan 50 MG tablet Commonly known as: COZAAR Take 75 mg by mouth daily.   multivitamin with minerals Tabs tablet Take 1 tablet by mouth daily.   OVER THE COUNTER MEDICATION Place 1 drop into both eyes daily as needed (dry eyes).   OXYGEN Inhale 2 L into the lungs at bedtime.   sertraline 100 MG tablet Commonly known as: ZOLOFT Take 200 mg by mouth daily.   Spiriva Respimat 2.5 MCG/ACT Aers Generic drug: Tiotropium Bromide Monohydrate Inhale 2 puffs into the lungs daily.   testosterone cypionate 100 MG/ML injection Commonly known as: DEPOTESTOTERONE CYPIONATE Inject 200 mg into the muscle every 28 (twenty-eight) days.   tobramycin (PF) 300 MG/5ML nebulizer solution Commonly known as: TOBI Take 5 mLs (300 mg total) by nebulization 2 (two) times daily.   traZODone 50 MG tablet Commonly known as: DESYREL Take 50 mg by mouth at bedtime.   vitamin B-12 1000 MCG tablet Commonly known as: CYANOCOBALAMIN Take 1,000 mcg by mouth daily.      Follow-up Information    [facility]   Contact information: [address] [facility] [place] [address] [phone]          No Known Allergies  Consultations:  PCCM interventional radiology and cardiothoracic surgery   Procedures/Studies: Dg Chest 2 View  Result Date: 07/24/2018 CLINICAL DATA:  Cough and hemoptysis EXAM: CHEST - 2 VIEW COMPARISON:  07/08/2018.  CT dated 07/07/2018 FINDINGS: Bronchiectasis is again noted bilaterally. Emphysematous changes are noted. There is a medial right lung base opacity which is similar to prior CT. The lungs are hyperexpanded. There is chronic pleuroparenchymal scarring bilaterally. There is blunting of the right costophrenic angle. There is no large focal area of  consolidation. There is no pneumothorax. IMPRESSION: 1. Trace right-sided pleural effusion. 2. Persistent bronchiectasis bilaterally. Scattered bilateral airspace opacities are again noted, likely similar to prior CT given differences in technique. 3. Persistent opacity in the medial right lung base, likely similar to prior CT given differences in technique. Electronically Signed   By: Katherine Mantle M.D.   On: 07/24/2018 18:17   Ct Angio Chest Pe W/cm &/or Wo Cm  Result Date: 07/07/2018 CLINICAL DATA:  Chest pain EXAM: CT ANGIOGRAPHY CHEST WITH CONTRAST TECHNIQUE: Multidetector CT imaging of the chest was performed using the standard protocol during bolus administration of intravenous contrast. Multiplanar CT image reconstructions and MIPs were obtained to evaluate the vascular anatomy. CONTRAST:  OMNIPAQUE IOHEXOL 350 MG/ML SOLN COMPARISON:  09/21/2016 FINDINGS: Cardiovascular: Normal heart size. No pericardial effusion. Negative for pulmonary artery filling defect. There is shunting away from the collapsed right lower lobe. Aortic and coronary atherosclerosis. Mediastinum/Nodes: Endotracheal tube in good position with tip between the clavicular heads and carina. The orogastric tube tip is at the stomach. Mediastinal adenopathy. Subcarinal node measures 19 mm, similar to prior. Stable milder enlargement of bilateral hilar nodes. These nodes are likely reactive Lungs/Pleura: Continued collapse of the right lower lobe with debris or stricture of apical and posterior basilar segments. 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 Reticulonodular  opacity throughout bilateral lungs is improved, with decreasing nodular areas. Upper Abdomen: No acute finding Musculoskeletal: No acute or aggressive finding.  Spondylosis. Review of the MIP images confirms the above findings. IMPRESSION: Negative for pulmonary embolism. 1. History of hemoptysis with cylindrical bronchiectasis and  airway narrowing in the right lower lobe has developed since 09/21/2016 chest CT. The affected segments are chronically collapsed. 2. 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 Electronically Signed   By: Marnee Spring M.D.   On: 07/07/2018 14:59   Ir Angiogram Selective Each Additional Vessel  Result Date: 07/27/2018 INDICATION: 78 year old male with a history of hemoptysis, presumably right-sided at site of worst bronchiectasis and CT changes EXAM: ULTRASOUND-GUIDED RIGHT COMMON FEMORAL ARTERY ACCESS BRONCHIAL ARTERY ANGIOGRAM EMBOLIZATION OF PATHOLOGIC RIGHT BRONCHIAL ARTERIES WITH EMBOSPHERES MEDICATIONS: None ANESTHESIA/SEDATION: Moderate (conscious) sedation was employed during this procedure. A total of Versed 3.0 mg and Fentanyl 25 mcg was administered intravenously. Moderate Sedation Time: 59 minutes. The patient's level of consciousness and vital signs were monitored continuously by radiology nursing throughout the procedure under my direct supervision. CONTRAST:  100 cc FLUOROSCOPY TIME:  Fluoroscopy Time: 20 minutes 0 seconds (382 mGy). COMPLICATIONS: None PROCEDURE: Informed consent was obtained from the patient following explanation of the procedure, risks, benefits and alternatives. The patient understands, agrees and consents for the procedure. All questions were addressed. A time out was performed prior to the initiation of the procedure. Maximal barrier sterile technique utilized including caps, mask, sterile gowns, sterile gloves, large sterile drape, hand hygiene, and Betadine prep. Ultrasound survey of the right inguinal region was performed with images stored and sent to PACs, confirming patency of the vessel. A micropuncture needle was used access the right common femoral artery under ultrasound. With excellent arterial blood flow returned, and an .018 micro wire was passed through the needle, observed enter the abdominal aorta under  fluoroscopy. The needle was removed, and a micropuncture sheath was placed over the wire. The inner dilator and wire were removed, and an 035 Bentson wire was advanced under fluoroscopy into the iliac artery. Atherosclerotic changes impeded advancement into the aorta. Five French sheath was placed. The dilator was removed and the sheath was flushed. Standard Glidewire and angled MPA catheter used to navigate the Glidewire into the aorta. With the catheter in the abdominal aorta, Bentson wire was replaced. MPA catheter was removed. Mickelson catheter passed over the Bentson wire. We proceeded with segmental angiogram/bronchial artery angiogram. Of the 3 or 4 origins suggested on prior CT, 2 of these were identified. The lower of which was small caliber and would not except the tip of the catheter, not enlarged. The most superior bronchial artery also had the origin of the supreme intercostal artery proximal to the right bronchial arteries. With the tip of the Mickelson catheter at the origin, we attempted passage of the microcatheter first with 014 fathom wire, then with 018 fathom wire. The 018 fathom wire was more useful in achieving purchase into the bronchial arteries, and we passed the 025 penumbra lantern catheter beyond the origin of the supra mentor costal artery. Angiogram was performed. We then performed embolization to stasis with 300-500 microspheres. One vial was used. Repeat angiogram was performed. The patient's mental status during the case was most likely related to a paradoxical reaction to the moderate sedation, and during the case restraints or in fluid for safety. After these arteries were embolized we withdrew from the case for safety. All catheters wires were removed.  Exoseal was deployed at the right common femoral artery for hemostasis. Patient remained hemodynamically stable throughout. No complications were encountered and no significant blood loss. During the case the patient experienced  paradoxical reaction to the moderate sedation, with deteriorating safety and employed restraints during the case. FINDINGS: Significant atherosclerotic changes of the iliac arteries, requiring precise Glidewire navigation for placement of the initial access. Two of the 3 bronchial arteries were identified. The bronchial artery that was engaged and treated corresponds to the origin/findings on the recent CT chest (image 97 series 8). These arteries were embolized to stasis beyond the origin of the supreme intercostal artery. Stasis was achieved in the bronchial arteries. Final angiogram of the supreme inter costal demonstrates no effects of embolization/non target embolization IMPRESSION: Status post ultrasound guided access right common femoral artery for bronchial artery angiogram and embolization of the most superior bronchial arteries, clearly pathologic on the angiogram. Signed, Yvone Neu. Reyne Dumas, RPVI V[facility] and Interventional Radiology Specialists [facility] Radiology Electronically Signed   By: Gilmer Mor D.O.   On: 07/27/2018 07:38   Ir Angiogram Selective Each Additional Vessel  Result Date: 07/27/2018 INDICATION: 78 year old male with a history of hemoptysis, presumably right-sided at site of worst bronchiectasis and CT changes EXAM: ULTRASOUND-GUIDED RIGHT COMMON FEMORAL ARTERY ACCESS BRONCHIAL ARTERY ANGIOGRAM EMBOLIZATION OF PATHOLOGIC RIGHT BRONCHIAL ARTERIES WITH EMBOSPHERES MEDICATIONS: None ANESTHESIA/SEDATION: Moderate (conscious) sedation was employed during this procedure. A total of Versed 3.0 mg and Fentanyl 25 mcg was administered intravenously. Moderate Sedation Time: 59 minutes. The patient's level of consciousness and vital signs were monitored continuously by radiology nursing throughout the procedure under my direct supervision. CONTRAST:  100 cc FLUOROSCOPY TIME:  Fluoroscopy Time: 20 minutes 0 seconds (382 mGy). COMPLICATIONS: None PROCEDURE: Informed consent was obtained  from the patient following explanation of the procedure, risks, benefits and alternatives. The patient understands, agrees and consents for the procedure. All questions were addressed. A time out was performed prior to the initiation of the procedure. Maximal barrier sterile technique utilized including caps, mask, sterile gowns, sterile gloves, large sterile drape, hand hygiene, and Betadine prep. Ultrasound survey of the right inguinal region was performed with images stored and sent to PACs, confirming patency of the vessel. A micropuncture needle was used access the right common femoral artery under ultrasound. With excellent arterial blood flow returned, and an .018 micro wire was passed through the needle, observed enter the abdominal aorta under fluoroscopy. The needle was removed, and a micropuncture sheath was placed over the wire. The inner dilator and wire were removed, and an 035 Bentson wire was advanced under fluoroscopy into the iliac artery. Atherosclerotic changes impeded advancement into the aorta. Five French sheath was placed. The dilator was removed and the sheath was flushed. Standard Glidewire and angled MPA catheter used to navigate the Glidewire into the aorta. With the catheter in the abdominal aorta, Bentson wire was replaced. MPA catheter was removed. Mickelson catheter passed over the Bentson wire. We proceeded with segmental angiogram/bronchial artery angiogram. Of the 3 or 4 origins suggested on prior CT, 2 of these were identified. The lower of which was small caliber and would not except the tip of the catheter, not enlarged. The most superior bronchial artery also had the origin of the supreme intercostal artery proximal to the right bronchial arteries. With the tip of the Mickelson catheter at the origin, we attempted passage of the microcatheter first with 014 fathom wire, then with 018 fathom wire. The 018 fathom wire was more  useful in achieving purchase into the bronchial  arteries, and we passed the 025 penumbra lantern catheter beyond the origin of the supra mentor costal artery. Angiogram was performed. We then performed embolization to stasis with 300-500 microspheres. One vial was used. Repeat angiogram was performed. The patient's mental status during the case was most likely related to a paradoxical reaction to the moderate sedation, and during the case restraints or in fluid for safety. After these arteries were embolized we withdrew from the case for safety. All catheters wires were removed. Exoseal was deployed at the right common femoral artery for hemostasis. Patient remained hemodynamically stable throughout. No complications were encountered and no significant blood loss. During the case the patient experienced paradoxical reaction to the moderate sedation, with deteriorating safety and employed restraints during the case. FINDINGS: Significant atherosclerotic changes of the iliac arteries, requiring precise Glidewire navigation for placement of the initial access. Two of the 3 bronchial arteries were identified. The bronchial artery that was engaged and treated corresponds to the origin/findings on the recent CT chest (image 97 series 8). These arteries were embolized to stasis beyond the origin of the supreme intercostal artery. Stasis was achieved in the bronchial arteries. Final angiogram of the supreme inter costal demonstrates no effects of embolization/non target embolization IMPRESSION: Status post ultrasound guided access right common femoral artery for bronchial artery angiogram and embolization of the most superior bronchial arteries, clearly pathologic on the angiogram. Signed, Yvone Neu. Reyne Dumas, RPVI Vascular and Interventional Radiology Specialists [facility] Radiology Electronically Signed   By: Gilmer Mor D.O.   On: 07/27/2018 07:38   Ir Angiogram Selective Each Additional Vessel  Result Date: 07/27/2018 INDICATION: 78 year old male with a  history of hemoptysis, presumably right-sided at site of worst bronchiectasis and CT changes EXAM: ULTRASOUND-GUIDED RIGHT COMMON FEMORAL ARTERY ACCESS BRONCHIAL ARTERY ANGIOGRAM EMBOLIZATION OF PATHOLOGIC RIGHT BRONCHIAL ARTERIES WITH EMBOSPHERES MEDICATIONS: None ANESTHESIA/SEDATION: Moderate (conscious) sedation was employed during this procedure. A total of Versed 3.0 mg and Fentanyl 25 mcg was administered intravenously. Moderate Sedation Time: 59 minutes. The patient's level of consciousness and vital signs were monitored continuously by radiology nursing throughout the procedure under my direct supervision. CONTRAST:  100 cc FLUOROSCOPY TIME:  Fluoroscopy Time: 20 minutes 0 seconds (382 mGy). COMPLICATIONS: None PROCEDURE: Informed consent was obtained from the patient following explanation of the procedure, risks, benefits and alternatives. The patient understands, agrees and consents for the procedure. All questions were addressed. A time out was performed prior to the initiation of the procedure. Maximal barrier sterile technique utilized including caps, mask, sterile gowns, sterile gloves, large sterile drape, hand hygiene, and Betadine prep. Ultrasound survey of the right inguinal region was performed with images stored and sent to PACs, confirming patency of the vessel. A micropuncture needle was used access the right common femoral artery under ultrasound. With excellent arterial blood flow returned, and an .018 micro wire was passed through the needle, observed enter the abdominal aorta under fluoroscopy. The needle was removed, and a micropuncture sheath was placed over the wire. The inner dilator and wire were removed, and an 035 Bentson wire was advanced under fluoroscopy into the iliac artery. Atherosclerotic changes impeded advancement into the aorta. Five French sheath was placed. The dilator was removed and the sheath was flushed. Standard Glidewire and angled MPA catheter used to navigate the  Glidewire into the aorta. With the catheter in the abdominal aorta, Bentson wire was replaced. MPA catheter was removed. Mickelson catheter passed over the Bentson wire.  We proceeded with segmental angiogram/bronchial artery angiogram. Of the 3 or 4 origins suggested on prior CT, 2 of these were identified. The lower of which was small caliber and would not except the tip of the catheter, not enlarged. The most superior bronchial artery also had the origin of the supreme intercostal artery proximal to the right bronchial arteries. With the tip of the Mickelson catheter at the origin, we attempted passage of the microcatheter first with 014 fathom wire, then with 018 fathom wire. The 018 fathom wire was more useful in achieving purchase into the bronchial arteries, and we passed the 025 penumbra lantern catheter beyond the origin of the supra mentor costal artery. Angiogram was performed. We then performed embolization to stasis with 300-500 microspheres. One vial was used. Repeat angiogram was performed. The patient's mental status during the case was most likely related to a paradoxical reaction to the moderate sedation, and during the case restraints or in fluid for safety. After these arteries were embolized we withdrew from the case for safety. All catheters wires were removed. Exoseal was deployed at the right common femoral artery for hemostasis. Patient remained hemodynamically stable throughout. No complications were encountered and no significant blood loss. During the case the patient experienced paradoxical reaction to the moderate sedation, with deteriorating safety and employed restraints during the case. FINDINGS: Significant atherosclerotic changes of the iliac arteries, requiring precise Glidewire navigation for placement of the initial access. Two of the 3 bronchial arteries were identified. The bronchial artery that was engaged and treated corresponds to the origin/findings on the recent CT chest  (image 97 series 8). These arteries were embolized to stasis beyond the origin of the supreme intercostal artery. Stasis was achieved in the bronchial arteries. Final angiogram of the supreme inter costal demonstrates no effects of embolization/non target embolization IMPRESSION: Status post ultrasound guided access right common femoral artery for bronchial artery angiogram and embolization of the most superior bronchial arteries, clearly pathologic on the angiogram. Signed, Yvone Neu. Reyne Dumas, RPVI Vascular and Interventional Radiology Specialists [facility] Radiology Electronically Signed   By: Gilmer Mor D.O.   On: 07/27/2018 07:38   Ir US Guide Vasc Access Right  Result Date: 07/27/2018 INDICATION: 78 year old male with a history of hemoptysis, presumably right-sided at site of worst bronchiectasis and CT changes EXAM: ULTRASOUND-GUIDED RIGHT COMMON FEMORAL ARTERY ACCESS BRONCHIAL ARTERY ANGIOGRAM EMBOLIZATION OF PATHOLOGIC RIGHT BRONCHIAL ARTERIES WITH EMBOSPHERES MEDICATIONS: None ANESTHESIA/SEDATION: Moderate (conscious) sedation was employed during this procedure. A total of Versed 3.0 mg and Fentanyl 25 mcg was administered intravenously. Moderate Sedation Time: 59 minutes. The patient's level of consciousness and vital signs were monitored continuously by radiology nursing throughout the procedure under my direct supervision. CONTRAST:  100 cc FLUOROSCOPY TIME:  Fluoroscopy Time: 20 minutes 0 seconds (382 mGy). COMPLICATIONS: None PROCEDURE: Informed consent was obtained from the patient following explanation of the procedure, risks, benefits and alternatives. The patient understands, agrees and consents for the procedure. All questions were addressed. A time out was performed prior to the initiation of the procedure. Maximal barrier sterile technique utilized including caps, mask, sterile gowns, sterile gloves, large sterile drape, hand hygiene, and Betadine prep. Ultrasound survey of the right  inguinal region was performed with images stored and sent to PACs, confirming patency of the vessel. A micropuncture needle was used access the right common femoral artery under ultrasound. With excellent arterial blood flow returned, and an .018 micro wire was passed through the needle, observed enter the abdominal aorta under  fluoroscopy. The needle was removed, and a micropuncture sheath was placed over the wire. The inner dilator and wire were removed, and an 035 Bentson wire was advanced under fluoroscopy into the iliac artery. Atherosclerotic changes impeded advancement into the aorta. Five French sheath was placed. The dilator was removed and the sheath was flushed. Standard Glidewire and angled MPA catheter used to navigate the Glidewire into the aorta. With the catheter in the abdominal aorta, Bentson wire was replaced. MPA catheter was removed. Mickelson catheter passed over the Bentson wire. We proceeded with segmental angiogram/bronchial artery angiogram. Of the 3 or 4 origins suggested on prior CT, 2 of these were identified. The lower of which was small caliber and would not except the tip of the catheter, not enlarged. The most superior bronchial artery also had the origin of the supreme intercostal artery proximal to the right bronchial arteries. With the tip of the Mickelson catheter at the origin, we attempted passage of the microcatheter first with 014 fathom wire, then with 018 fathom wire. The 018 fathom wire was more useful in achieving purchase into the bronchial arteries, and we passed the 025 penumbra lantern catheter beyond the origin of the supra mentor costal artery. Angiogram was performed. We then performed embolization to stasis with 300-500 microspheres. One vial was used. Repeat angiogram was performed. The patient's mental status during the case was most likely related to a paradoxical reaction to the moderate sedation, and during the case restraints or in fluid for safety. After  these arteries were embolized we withdrew from the case for safety. All catheters wires were removed. Exoseal was deployed at the right common femoral artery for hemostasis. Patient remained hemodynamically stable throughout. No complications were encountered and no significant blood loss. During the case the patient experienced paradoxical reaction to the moderate sedation, with deteriorating safety and employed restraints during the case. FINDINGS: Significant atherosclerotic changes of the iliac arteries, requiring precise Glidewire navigation for placement of the initial access. Two of the 3 bronchial arteries were identified. The bronchial artery that was engaged and treated corresponds to the origin/findings on the recent CT chest (image 97 series 8). These arteries were embolized to stasis beyond the origin of the supreme intercostal artery. Stasis was achieved in the bronchial arteries. Final angiogram of the supreme inter costal demonstrates no effects of embolization/non target embolization IMPRESSION: Status post ultrasound guided access right common femoral artery for bronchial artery angiogram and embolization of the most superior bronchial arteries, clearly pathologic on the angiogram. Signed, Yvone Neu. Reyne Dumas, RPVI [facility] Radiology Electronically Signed   By: Gilmer Mor D.O.   On: 07/27/2018 07:38   Dg Chest Port 1 View  Result Date: 07/08/2018 CLINICAL DATA:  Acute respiratory failure EXAM: PORTABLE CHEST 1 VIEW COMPARISON:  CT chest dated 07/07/2018 FINDINGS: Endotracheal tube terminates 4.5 cm above the carina. Enteric tube courses into the stomach. Multifocal patchy opacities in the right upper lobe and left lower lobe, suggesting pneumonia of better evaluated on recent CT. Right lower lobe atelectasis/collapse. The heart is top-normal in size. IMPRESSION: Endotracheal tube terminates 4.5 cm above the carina. Multifocal pneumonia,  better evaluated on recent CT. Right lower lobe atelectasis/collapse. Electronically Signed   By: Charline Bills M.D.   On: 07/08/2018 05:11   Dg Chest Port 1 View  Result Date: 07/07/2018 CLINICAL DATA:  Respiratory difficulty EXAM: PORTABLE CHEST 1 VIEW COMPARISON:  07/07/2018 1150 hours FINDINGS: Endotracheal tube placed with its tip 5.4 cm  from the carina. NG tube placed with its tip beyond the gastroesophageal junction. Normal heart size. Heterogeneous opacities throughout both lungs are stable. There is volume loss in the right lung. No pneumothorax. Small right pleural effusion is suspected. IMPRESSION: Endotracheal and NG tubes placed as described. Small right pleural effusion is suspected. Opacities throughout both lungs are stable. Electronically Signed   By: Jolaine Click M.D.   On: 07/07/2018 13:25   Dg Chest Port 1 View  Result Date: 07/07/2018 CLINICAL DATA:  Reason for exam: SOB  Hx of COPDsob EXAM: PORTABLE CHEST 1 VIEW COMPARISON:  Chest radiograph 11/07/2016 FINDINGS: Small RIGHT effusion and basilar opacity. Chronic interstitial lung disease. No pneumothorax. Hyperinflated lungs IMPRESSION: RIGHT pleural effusion and probable atelectasis or infiltrate. Electronically Signed   By: Genevive Bi M.D.   On: 07/07/2018 12:17   Ct Angio Chest Aorta W/cm &/or Wo/cm  Result Date: 07/26/2018 CLINICAL DATA:  78 year old male with a history of hemoptysis EXAM: CT ANGIOGRAPHY CHEST WITH CONTRAST TECHNIQUE: Multidetector CT imaging of the chest was performed using the standard protocol during bolus administration of intravenous contrast. Multiplanar CT image reconstructions and MIPs were obtained to evaluate the vascular anatomy. CONTRAST:  OMNIPAQUE IOHEXOL 350 MG/ML SOLN COMPARISON:  07/07/2018, 09/21/2016, 06/21/2016 FINDINGS: Cardiovascular: Heart: No cardiomegaly. No pericardial fluid/thickening. Calcifications of left main, left anterior descending, circumflex, right coronary  arteries. Aorta: Ascending aorta measures 3.4 cm. No dissection. No periaortic fluid/inflammation. Mild atherosclerosis of the thoracic aorta. Greatest diameter at the hiatus measures 2.9 cm. Three vessel arch with minimal atherosclerosis at the origins. Cervical arteries at the base of the neck are patent. Bilateral axillary arteries are patent. The CT angiogram demonstrate 4 bronchial arteries/bronchial artery origins. There is no significant hypertrophy or appreciable tortuosity of any of the bronchial arteries. Pulmonary arteries: Main pulmonary artery measures 3.2 cm. There is relative enlargement of the pulmonary arteries as they extend toward the outer 1/3 of the lung, relatively larger than the accompanying bronchi. No filling defects of the pulmonary arteries. Mediastinum/Nodes: Redemonstration multiple lymph nodes of the mediastinal nodal stations. The largest of the lowest paratracheal nodes are unchanged from the comparison CT. Subcarinal nodes remain enlarged measuring 17 mm-18 mm. Unremarkable appearance of the thoracic esophagus. Unremarkable thoracic inlet. Lungs/Pleura: Compared to the recent CT of 07/07/2018 and the remote CT of 09/21/2016, there has been improved aeration of the right lower lobe. Redemonstration of bronchiectasis of the right lower lobe, with some bronchi occluded with debris. There is associated partial volume loss in linear configuration along the bronchial vasculature. No evidence of extravasated contrast indicate active bleeding. Redemonstration of centrilobular nodularity throughout the bilateral lungs. Regions of nodularity improved from the remote CT of 2018, particularly of the lower lobes,. No pneumothorax or pleural effusion. Upper Abdomen: No acute finding of the upper abdomen. Musculoskeletal: No acute displaced fracture. Degenerative changes of the spine. Review of the MIP images confirms the above findings. IMPRESSION: No acute arterial abnormality. Small bronchial  arteries identified without significant tortuosity or engorgement. If the patient's reported hemoptysis is originating from bronchial artery source, the most likely site would be the right lower lobe, though bronchoscopy may be useful for confirming. Improving aeration of the right lower lobe, with redemonstration of advanced bronchiectasis. Multiple bronchi of the right lower lobe demonstrate partial occlusion with debris/fluid, potentially blood products, or sequela of aspiration. Redemonstration of centrilobular nodularity of the bilateral lungs, relatively similar in distribution to the CT of 07/07/2018, perhaps slightly improved. Overall there is  significant improvement to the prior CT of 2018. While chronic infection as previously discussed such as MAC would be leading differential, follow-up CT once the patient has been treated/recovered may be useful to assure stability, particularly of the lingular nodularity. Mediastinal adenopathy persists, likely reactive. Aortic Atherosclerosis (ICD10-I70.0). Electronically Signed   By: Gilmer Mor D.O.   On: 07/26/2018 14:51   Ir Embo Art  [facility]  [employer] Guide [employer]  Result Date: 07/27/2018 INDICATION: 78 year old male with a history of hemoptysis, presumably right-sided at site of worst bronchiectasis and CT changes EXAM: ULTRASOUND-GUIDED RIGHT COMMON FEMORAL ARTERY ACCESS BRONCHIAL ARTERY ANGIOGRAM EMBOLIZATION OF PATHOLOGIC RIGHT BRONCHIAL ARTERIES WITH EMBOSPHERES MEDICATIONS: None ANESTHESIA/SEDATION: Moderate (conscious) sedation was employed during this procedure. A total of Versed 3.0 mg and Fentanyl 25 mcg was administered intravenously. Moderate Sedation Time: 59 minutes. The patient's level of consciousness and vital signs were monitored continuously by radiology nursing throughout the procedure under my direct supervision. CONTRAST:  100 cc FLUOROSCOPY TIME:  Fluoroscopy Time: 20 minutes 0 seconds (382 mGy). COMPLICATIONS: None  PROCEDURE: Informed consent was obtained from the patient following explanation of the procedure, risks, benefits and alternatives. The patient understands, agrees and consents for the procedure. All questions were addressed. A time out was performed prior to the initiation of the procedure. Maximal barrier sterile technique utilized including caps, mask, sterile gowns, sterile gloves, large sterile drape, hand hygiene, and Betadine prep. Ultrasound survey of the right inguinal region was performed with images stored and sent to PACs, confirming patency of the vessel. A micropuncture needle was used access the right common femoral artery under ultrasound. With excellent arterial blood flow returned, and an .018 micro wire was passed through the needle, observed enter the abdominal aorta under fluoroscopy. The needle was removed, and a micropuncture sheath was placed over the wire. The inner dilator and wire were removed, and an 035 Bentson wire was advanced under fluoroscopy into the iliac artery. Atherosclerotic changes impeded advancement into the aorta. Five French sheath was placed. The dilator was removed and the sheath was flushed. Standard Glidewire and angled MPA catheter used to navigate the Glidewire into the aorta. With the catheter in the abdominal aorta, Bentson wire was replaced. MPA catheter was removed. Mickelson catheter passed over the Bentson wire. We proceeded with segmental angiogram/bronchial artery angiogram. Of the 3 or 4 origins suggested on prior CT, 2 of these were identified. The lower of which was small caliber and would not except the tip of the catheter, not enlarged. The most superior bronchial artery also had the origin of the supreme intercostal artery proximal to the right bronchial arteries. With the tip of the Mickelson catheter at the origin, we attempted passage of the microcatheter first with 014 fathom wire, then with 018 fathom wire. The 018 fathom wire was more useful in  achieving purchase into the bronchial arteries, and we passed the 025 penumbra lantern catheter beyond the origin of the supra mentor costal artery. Angiogram was performed. We then performed embolization to stasis with 300-500 microspheres. One vial was used. Repeat angiogram was performed. The patient's mental status during the case was most likely related to a paradoxical reaction to the moderate sedation, and during the case restraints or in fluid for safety. After these arteries were embolized we withdrew from the case for safety. All catheters wires were removed. Exoseal was deployed at the right common femoral artery for hemostasis. Patient remained hemodynamically stable throughout. No complications were encountered and no significant  blood loss. During the case the patient experienced paradoxical reaction to the moderate sedation, with deteriorating safety and employed restraints during the case. FINDINGS: Significant atherosclerotic changes of the iliac arteries, requiring precise Glidewire navigation for placement of the initial access. Two of the 3 bronchial arteries were identified. The bronchial artery that was engaged and treated corresponds to the origin/findings on the recent CT chest (image 97 series 8). These arteries were embolized to stasis beyond the origin of the supreme intercostal artery. Stasis was achieved in the bronchial arteries. Final angiogram of the supreme inter costal demonstrates no effects of embolization/non target embolization IMPRESSION: Status post ultrasound guided access right common femoral artery for bronchial artery angiogram and embolization of the most superior bronchial arteries, clearly pathologic on the angiogram. Signed, Yvone Neu. Reyne Dumas, RPVI [facility] Radiology Electronically Signed   By: Gilmer Mor D.O.   On: 07/27/2018 07:38    (Echo, Carotid, EGD, Colonoscopy, ERCP)    Subjective: Patient  extremely anxious ready to go home had a good night breathing better no further hemoptysis  Discharge Exam: Vitals:   07/28/18 0816 07/28/18 0857  BP:  (!) 134/54  Pulse:  73  Resp:    Temp:  98.7 F (37.1 C)  SpO2: 97% 94%   Vitals:   07/27/18 2013 07/27/18 2100 07/28/18 0816 07/28/18 0857  BP:  (!) 95/40  (!) 134/54  Pulse:  62  73  Resp:  18    Temp:  (!) 97.5 F (36.4 C)  98.7 F (37.1 C)  TempSrc:  Oral  Oral  SpO2: 95% 98% 97% 94%  Weight:      Height:        General: Pt is alert, awake, not in acute distress Cardiovascular: RRR, S1/S2 +, no rubs, no gallops Respiratory: CTA bilaterally, no wheezing, no rhonchi Abdominal: Soft, NT, ND, bowel sounds + Extremities: no edema, no cyanosis    The results of significant diagnostics from this hospitalization (including imaging, microbiology, ancillary and laboratory) are listed below for reference.     Microbiology: Recent Results (from the past 240 hour(s))  SARS Coronavirus 2     Status: None   Collection Time: 07/24/18  7:32 PM  Result Value Ref Range Status   SARS Coronavirus 2 NOT DETECTED NOT DETECTED Final    Comment: (NOTE) SARS-CoV-2 target nucleic acids are NOT DETECTED. The SARS-CoV-2 RNA is generally detectable in upper and lower respiratory specimens during the acute phase of infection.  Negative  results do not preclude SARS-CoV-2 infection, do not rule out co-infections with other pathogens, and should not be used as the sole basis for treatment or other patient management decisions.  Negative results must be combined with clinical observations, patient history, and epidemiological information. The expected result is Not Detected. Fact Sheet for Patients: http://www.biofiredefense.com/wp-content/uploads/2020/03/BIOFIRE-COVID -19-patients.pdf Fact Sheet for Healthcare Providers: http://www.biofiredefense.com/wp-content/uploads/2020/03/BIOFIRE-COVID -19-hcp.pdf This test is not yet approved or  cleared by the [place] and  has been authorized for detection and/or diagnosis of SARS-CoV-2 by FDA under an Emergency Use Authorization (EUA).  This EUA will remain in effec t (meaning this test can be used) for the duration of  the COVID-19 declaration under Section 564(b)(1) of the Act, 21 U.S.C. section 360bbb-3(b)(1), unless the authorization is terminated or revoked sooner. Performed at [facility], [address] [place], [place] [address]   Culture, respiratory     Status: None (Preliminary result)   Collection Time: 07/27/18  1:33  PM   Specimen: Bronchial Alveolar Lavage; Respiratory  Result Value Ref Range Status   Specimen Description BRONCHIAL ALVEOLAR LAVAGE RIGHT MIDDLE  Final   Special Requests   Final    PATIENT ON FOLLOWING TOBRAMYCIN NEBULIZER AZITHROMAX   Gram Stain   Final    FEW WBC PRESENT, PREDOMINANTLY PMN FEW GRAM POSITIVE RODS RARE GRAM POSITIVE COCCI Performed at [facility], [address], [place], [place] [address]    Culture PENDING  Incomplete   Report Status PENDING  Incomplete  Culture, respiratory     Status: None (Preliminary result)   Collection Time: 07/27/18  1:56 PM   Specimen: Lung, Right Lower Lobe  Result Value Ref Range Status   Specimen Description LUNG RIGHT LOWER ASPIRATED SECRETIONS  Final   Special Requests   Final    PATIENT ON FOLLOWING TOBRAMYCIN NEBULIZER AZITHROMAX   Gram Stain   Final    MODERATE WBC PRESENT, PREDOMINANTLY PMN FEW GRAM NEGATIVE RODS FEW GRAM POSITIVE COCCI Performed at [facility], [address] [place], [place] [address]    Culture PENDING  Incomplete   Report Status PENDING  Incomplete     Labs: BNP (last 3 results) Recent Labs    07/07/18 1151  BNP 113.2*   Basic Metabolic Panel: Recent Labs  Lab 07/24/18 1800 07/26/18 0606 07/27/18 1021  NA 137 141 140  K 4.4 4.1 3.5  CL 101 104 104  CO2 28 30 26   GLUCOSE 100* 83 94  BUN 17 12 12   CREATININE 0.98  0.99 1.12  CALCIUM 8.8* 9.0 9.1   Liver Function Tests: Recent Labs  Lab 07/24/18 1800  AST 19  ALT 16  ALKPHOS 75  BILITOT 0.5  PROT 7.0  ALBUMIN 2.8*   No results for input(s): LIPASE, AMYLASE in the last 168 hours. No results for input(s): AMMONIA in the last 168 hours. CBC: Recent Labs  Lab 07/24/18 1800 07/24/18 2135 07/25/18 0233 07/25/18 0520 07/26/18 0606 07/27/18 1607 07/28/18 0833  WBC 13.8* 12.7* 9.0 7.5 7.2  --  13.4*  NEUTROABS 11.3*  --   --   --   --   --   --   HGB 8.8* 8.8* 8.2* 8.5* 9.0* 8.6* 8.5*  HCT 30.0* 29.7* 28.1* 28.8* 30.1* 28.4* 28.2*  MCV 90.6 89.7 91.2 89.4 89.1  --  89.5  PLT 356 368 336 353 352  --  337   Cardiac Enzymes: No results for input(s): CKTOTAL, CKMB, CKMBINDEX, TROPONINI in the last 168 hours. BNP: Invalid input(s): POCBNP CBG: No results for input(s): GLUCAP in the last 168 hours. D-Dimer No results for input(s): DDIMER in the last 72 hours. Hgb A1c No results for input(s): HGBA1C in the last 72 hours. Lipid Profile No results for input(s): CHOL, HDL, LDLCALC, TRIG, CHOLHDL, LDLDIRECT in the last 72 hours. Thyroid function studies No results for input(s): TSH, T4TOTAL, T3FREE, THYROIDAB in the last 72 hours.  Invalid input(s): FREET3 Anemia work up No results for input(s): VITAMINB12, FOLATE, FERRITIN, TIBC, IRON, RETICCTPCT in the last 72 hours. Urinalysis    Component Value Date/Time   COLORURINE YELLOW 07/07/2018 1336   APPEARANCEUR CLEAR 07/07/2018 1336   LABSPEC 1.018 07/07/2018 1336   PHURINE 5.0 07/07/2018 1336   GLUCOSEU NEGATIVE 07/07/2018 1336   HGBUR NEGATIVE 07/07/2018 1336   BILIRUBINUR NEGATIVE 07/07/2018 1336   KETONESUR NEGATIVE 07/07/2018 1336   PROTEINUR 100 (A) 07/07/2018 1336   NITRITE NEGATIVE 07/07/2018 1336   LEUKOCYTESUR NEGATIVE  07/07/2018 1336   Sepsis Labs Invalid input(s): PROCALCITONIN,  WBC,  LACTICIDVEN Microbiology Recent Results (from the past 240 hour(s))  SARS  Coronavirus 2     Status: None   Collection Time: 07/24/18  7:32 PM  Result Value Ref Range Status   SARS Coronavirus 2 NOT DETECTED NOT DETECTED Final    Comment: (NOTE) SARS-CoV-2 target nucleic acids are NOT DETECTED. The SARS-CoV-2 RNA is generally detectable in upper and lower respiratory specimens during the acute phase of infection.  Negative  results do not preclude SARS-CoV-2 infection, do not rule out co-infections with other pathogens, and should not be used as the sole basis for treatment or other patient management decisions.  Negative results must be combined with clinical observations, patient history, and epidemiological information. The expected result is Not Detected. Fact Sheet for Patients: http://www.biofiredefense.com/wp-content/uploads/2020/03/BIOFIRE-COVID -19-patients.pdf Fact Sheet for Healthcare Providers: http://www.biofiredefense.com/wp-content/uploads/2020/03/BIOFIRE-COVID -19-hcp.pdf This test is not yet approved or cleared by the [place] and  has been authorized for detection and/or diagnosis of SARS-CoV-2 by FDA under an Emergency Use Authorization (EUA).  This EUA will remain in effec t (meaning this test can be used) for the duration of  the COVID-19 declaration under Section 564(b)(1) of the Act, 21 U.S.C. section 360bbb-3(b)(1), unless the authorization is terminated or revoked sooner. Performed at [facility], [address], [place], [place] [address]   Culture, respiratory     Status: None (Preliminary result)   Collection Time: 07/27/18  1:33 PM   Specimen: Bronchial Alveolar Lavage; Respiratory  Result Value Ref Range Status   Specimen Description BRONCHIAL ALVEOLAR LAVAGE RIGHT MIDDLE  Final   Special Requests   Final    PATIENT ON FOLLOWING TOBRAMYCIN NEBULIZER AZITHROMAX   Gram Stain   Final    FEW WBC PRESENT, PREDOMINANTLY PMN FEW GRAM POSITIVE RODS RARE GRAM POSITIVE COCCI Performed at [facility],  [address], [place], [place] [address]    Culture PENDING  Incomplete   Report Status PENDING  Incomplete  Culture, respiratory     Status: None (Preliminary result)   Collection Time: 07/27/18  1:56 PM   Specimen: Lung, Right Lower Lobe  Result Value Ref Range Status   Specimen Description LUNG RIGHT LOWER ASPIRATED SECRETIONS  Final   Special Requests   Final    PATIENT ON FOLLOWING TOBRAMYCIN NEBULIZER AZITHROMAX   Gram Stain   Final    MODERATE WBC PRESENT, PREDOMINANTLY PMN FEW GRAM NEGATIVE RODS FEW GRAM POSITIVE COCCI Performed at [facility], [address], [place], [place] [address]    Culture PENDING  Incomplete   Report Status PENDING  Incomplete     SIGNED:  35 min Alwyn Ren, MD  [facility] 07/28/2018, 9:02 AM Pager   If 7PM-7AM, please contact night-coverage [email or web address] Password [email or web address]

## 2018-07-28 NOTE — Care Management (Signed)
Spoke w patient, he states that he has nebulizer at home and that wife will provide ride home and bring oxygen for transport.

## 2018-07-29 LAB — CULTURE, RESPIRATORY W GRAM STAIN
Culture: NORMAL
Culture: NORMAL

## 2018-07-30 ENCOUNTER — Encounter (HOSPITAL_COMMUNITY): Payer: Self-pay | Admitting: Emergency Medicine

## 2018-07-30 NOTE — Anesthesia Postprocedure Evaluation (Signed)
Anesthesia Post Note  Patient: William Kirk  Procedure(s) Performed: VIDEO BRONCHOSCOPY WITHOUT FLUORO (N/A )     Patient location during evaluation: PACU Anesthesia Type: General Level of consciousness: awake Pain management: pain level controlled Vital Signs Assessment: post-procedure vital signs reviewed and stable Respiratory status: spontaneous breathing Cardiovascular status: stable Postop Assessment: no apparent nausea or vomiting Anesthetic complications: no    Last Vitals:  Vitals:   07/28/18 0816 07/28/18 0857  BP:  (!) 134/54  Pulse:  73  Resp:    Temp:  37.1 C  SpO2: 97% 94%    Last Pain:  Vitals:   07/28/18 0919  TempSrc:   PainSc: 0-No pain   Pain Goal:                   Huston Foley

## 2018-08-15 ENCOUNTER — Encounter: Payer: Self-pay | Admitting: Thoracic Surgery (Cardiothoracic Vascular Surgery)

## 2018-08-15 ENCOUNTER — Institutional Professional Consult (permissible substitution) (INDEPENDENT_AMBULATORY_CARE_PROVIDER_SITE_OTHER): Payer: No Typology Code available for payment source | Admitting: Thoracic Surgery (Cardiothoracic Vascular Surgery)

## 2018-08-15 ENCOUNTER — Other Ambulatory Visit: Payer: Self-pay

## 2018-08-15 VITALS — BP 124/61 | HR 74 | Temp 98.1°F | Resp 16 | Ht 71.0 in | Wt 148.0 lb

## 2018-08-15 DIAGNOSIS — R918 Other nonspecific abnormal finding of lung field: Secondary | ICD-10-CM

## 2018-08-15 DIAGNOSIS — J189 Pneumonia, unspecified organism: Secondary | ICD-10-CM | POA: Diagnosis not present

## 2018-08-15 NOTE — Progress Notes (Addendum)
[address]       [place] [address]             [phone]     HPI: Mr. Cocuzza returns to discuss possible lung resection.  William Kirk is a 78 year old man with a history of tobacco abuse, recurrent pneumonia, bronchiectasis, COPD, peripheral arterial disease, right lower extremity angioplasty, gastric ulcer, arthritis, and depression.  He has about a 40-50-pack-year history of smoking before quitting 4 years ago.  Over the past several years he has had multiple pneumonias.  He he has been treated with multiple rounds of antibiotics and then was on Augmentin for almost a year according to his wife.  That was stopped earlier this year.  He was admitted Jul 07, 2018 with presumed pneumonia with hemoptysis.  CT of the chest showed chronic volume loss and bronchiectasis in the right lower lobe, but no bleeding site was definitively identified.  He then presented back on 07/24/2018 with recurrent hemoptysis.  According to his wife it was about 1-1/2 cups.  He was intubated for airway control.  During that hospitalization he underwent bronchial artery embolization.  Dr. Lamonte Sakai did bronchoscopy which revealed no endobronchial mass lesions.  Cultures grew Candida.  He is currently on Augmentin and has an appointment to see Dr.Sanders next week.  He was discharged home on home oxygen.  He previously had been on oxygen only at night.  He lost about 20 pounds over the past month and a half.  His appetite has improved but he has not started gaining significant weight yet.  He still has a cough.  He had one episode of a small amount of hemoptysis about 3 days after he went home but has not had any further hemoptysis.  He has yellowish sputum "not much."  Zubrod Score: At the time of surgery this patient's most appropriate activity status/level should be described as: _0     0    Normal activity, no symptoms _1     1    Restricted in physical strenuous activity but ambulatory,  able to do out light work _2     2    Ambulatory and capable of self care, unable to do work activities, up and about >50 % of waking hours                              _3     3    Only limited self care, in bed greater than 50% of waking hours _4     4    Completely disabled, no self care, confined to bed or chair _5     5    Moribund  Past Medical History:  Diagnosis Date  . Arthritis    "hands" (09/21/2016)  . Bleeding stomach ulcer 2000s  . COPD (chronic obstructive pulmonary disease) ([facility])   . Depression   . Pneumonia    "now and several times before this" (09/21/2016)  . PVD (peripheral vascular disease) ([facility]) 2014   prev PTCA on RLE    Current Outpatient Medications  Medication Sig Dispense Refill  . acetaminophen (TYLENOL) 650 MG CR tablet Take 1 tablet (650 mg total) by mouth every 8 (eight) hours as needed for pain.    Marland Kitchen albuterol (PROVENTIL) (2.5 MG/3ML) 0.083% nebulizer solution Take 3 mLs (2.5 mg total) by nebulization 2 (two) times a day for 30 days.    Marland Kitchen albuterol (VENTOLIN HFA) 108 (90 Base)  MCG/ACT inhaler Inhale 2 puffs into the lungs every 4 (four) hours as needed for wheezing or shortness of breath.    Marland Kitchen amoxicillin-clavulanate (AUGMENTIN) 875-125 MG tablet Take 1 tablet by mouth 2 (two) times daily.    Marland Kitchen atorvastatin (LIPITOR) 20 MG tablet Take 20 mg by mouth at bedtime.    . budesonide-formoterol (SYMBICORT) 160-4.5 MCG/ACT inhaler Inhale 2 puffs into the lungs 2 (two) times daily.    Marland Kitchen gabapentin (NEURONTIN) 300 MG capsule Take 300 mg by mouth at bedtime.     Marland Kitchen HYDROcodone-acetaminophen (NORCO) 10-325 MG tablet Take 1-2 tablets by mouth See admin instructions. Take 2 tablets by mouth every morning and 1 tablet at night    . losartan (COZAAR) 50 MG tablet Take 75 mg by mouth daily.    . Multiple Vitamin (MULTIVITAMIN WITH MINERALS) TABS tablet Take 1 tablet by mouth daily.    . OXYGEN Inhale 3 L into the lungs. CONTINUOUSLY    . sertraline (ZOLOFT) 100 MG tablet Take  200 mg by mouth daily.     Marland Kitchen testosterone cypionate (DEPOTESTOTERONE CYPIONATE) 100 MG/ML injection Inject 200 mg into the muscle every 28 (twenty-eight) days.     . Tiotropium Bromide Monohydrate (SPIRIVA RESPIMAT) 2.5 MCG/ACT AERS Inhale 2 puffs into the lungs daily.    . traZODone (DESYREL) 50 MG tablet Take 50 mg by mouth at bedtime.    . vitamin B-12 (CYANOCOBALAMIN) 1000 MCG tablet Take 1,000 mcg by mouth daily.     No current facility-administered medications for this visit.     Physical Exam Vitals signs reviewed.  Constitutional:      General: He is not in acute distress.    Comments: Mildly cachectic  HENT:     Head: Normocephalic and atraumatic.     Comments: Wearing surgical mask Eyes:     General: No scleral icterus.    Extraocular Movements: Extraocular movements intact.     Conjunctiva/sclera: Conjunctivae normal.  Neck:     Musculoskeletal: Neck supple.  Cardiovascular:     Rate and Rhythm: Normal rate and regular rhythm.     Heart sounds: Normal heart sounds. No murmur. No friction rub. No gallop.   Pulmonary:     Effort: Pulmonary effort is normal. No respiratory distress.     Breath sounds: No wheezing or rales.     Comments: Diminished breath sounds bilaterally Abdominal:     General: There is no distension.     Palpations: Abdomen is soft.     Tenderness: There is no abdominal tenderness.  Musculoskeletal:        General: No swelling.  Lymphadenopathy:     Cervical: No cervical adenopathy.  Skin:    General: Skin is warm and dry.  Neurological:     General: No focal deficit present.     Mental Status: He is alert and oriented to person, place, and time.     Cranial Nerves: No cranial nerve deficit.     Motor: No weakness.     Diagnostic Tests: CT ANGIOGRAPHY CHEST WITH CONTRAST  TECHNIQUE: Multidetector CT imaging of the chest was performed using the standard protocol during bolus administration of intravenous contrast. Multiplanar CT image  reconstructions and MIPs were obtained to evaluate the vascular anatomy.  CONTRAST:  1104m OMNIPAQUE IOHEXOL 350 MG/ML SOLN  COMPARISON:  07/07/2018, 09/21/2016, 06/21/2016  FINDINGS: Cardiovascular:  Heart:  No cardiomegaly. No pericardial fluid/thickening. Calcifications of left main, left anterior descending, circumflex, right coronary arteries.  Aorta:  Ascending aorta  measures 3.4 cm. No dissection. No periaortic fluid/inflammation. Mild atherosclerosis of the thoracic aorta. Greatest diameter at the hiatus measures 2.9 cm.  Three vessel arch with minimal atherosclerosis at the origins. Cervical arteries at the base of the neck are patent. Bilateral axillary arteries are patent.  The CT angiogram demonstrate 4 bronchial arteries/bronchial artery origins. There is no significant hypertrophy or appreciable tortuosity of any of the bronchial arteries.  Pulmonary arteries:  Main pulmonary artery measures 3.2 cm. There is relative enlargement of the pulmonary arteries as they extend toward the outer 1/3 of the lung, relatively larger than the accompanying bronchi.  No filling defects of the pulmonary arteries.  Mediastinum/Nodes: Redemonstration multiple lymph nodes of the mediastinal nodal stations. The largest of the lowest paratracheal nodes are unchanged from the comparison CT. Subcarinal nodes remain enlarged measuring 17 mm-18 mm. Unremarkable appearance of the thoracic esophagus.  Unremarkable thoracic inlet.  Lungs/Pleura: Compared to the recent CT of 07/07/2018 and the remote CT of 09/21/2016, there has been improved aeration of the right lower lobe. Redemonstration of bronchiectasis of the right lower lobe, with some bronchi occluded with debris. There is associated partial volume loss in linear configuration along the bronchial vasculature. No evidence of extravasated contrast indicate active bleeding.  Redemonstration of centrilobular  nodularity throughout the bilateral lungs. Regions of nodularity improved from the remote CT of 2018, particularly of the lower lobes,.  No pneumothorax or pleural effusion.  Upper Abdomen: No acute finding of the upper abdomen.  Musculoskeletal: No acute displaced fracture. Degenerative changes of the spine.  Review of the MIP images confirms the above findings.  IMPRESSION: No acute arterial abnormality.  Small bronchial arteries identified without significant tortuosity or engorgement. If the patient's reported hemoptysis is originating from bronchial artery source, the most likely site would be the right lower lobe, though bronchoscopy may be useful for confirming.  Improving aeration of the right lower lobe, with redemonstration of advanced bronchiectasis. Multiple bronchi of the right lower lobe demonstrate partial occlusion with debris/fluid, potentially blood products, or sequela of aspiration.  Redemonstration of centrilobular nodularity of the bilateral lungs, relatively similar in distribution to the CT of 07/07/2018, perhaps slightly improved. Overall there is significant improvement to the prior CT of 2018. While chronic infection as previously discussed such as MAC would be leading differential, follow-up CT once the patient has been treated/recovered may be useful to assure stability, particularly of the lingular nodularity.  Mediastinal adenopathy persists, likely reactive.  Aortic Atherosclerosis (ICD10-I70.0).   Electronically Signed   By: Corrie Mckusick D.O.   On: 07/26/2018 14:51 I personally reviewed the CT images and concur with the findings noted above  Fungal cultures Component 2wk ago  Result 1 CommentAbnormal    Comment: (NOTE)  Fungal elements, such as arthroconidia, hyphal fragments,  chlamydoconidia, observed.  Performed At: [facility]  [place], [place] [id number]  Rush Farmer MD WU:[id number]       Component 2wk ago  Fungal result 1 Candida albicansAbnormal          Impression: William Kirk is a 79 year old gentleman with a past medical history significant for tobacco abuse, COPD, recurrent pneumonias, bronchiectasis, massive hemoptysis, peripheral arterial disease, reflux, and depression.  Over the past several years he has had multiple pneumonias and now has a chronically atelectatic right lower lobe with bronchiectasis.  He was admitted in May with hemoptysis.  He then was readmitted in mid June with massive hemoptysis.  He underwent bronchial artery embolization which  controlled the hemoptysis.  Bronchoscopy revealed no endobronchial mass lesions.  He currently is on Augmentin.  Cultures grew Candida albicans and fungal elements were noted including Arthroconidia, hyphal fragments, and chlamydoconidia.  He has not been on antifungal therapy.  He has an appointment with Dr. Baird Cancer on Monday and I will defer that decision to him.  We discussed the possibility of a right lower lobectomy.  The indication is bronchiectasis with recurrent pneumonia and hemoptysis.  His right lower lobe is essentially destroyed and he is highly likely to have repeated infections.  Bronchial artery embolization was successful this time but there is no guarantee that he will not develop recurrent hemoptysis..  I informed him of the general nature of the procedure including the incisions to be used.  We would attempt to do this with a VATS approach but given the recurrent infection there is a high likelihood we would need to do a thoracotomy.  He understands the general nature of the procedure, the incisions to be used, the need for general anesthesia, the use of drainage tubes postoperatively, the expected hospital stay, and the overall recovery.  I informed him of the indications, risk, benefits, and alternatives.  He and his wife understand the risks include, but are not limited to death, MI, DVT, PE,  bleeding, possible need for transfusion, infection, prolonged air leak, cardiac arrhythmias, as well as the possibility of other unforeseeable complications.  He needs pulmonary function testing.  Given the chronic atelectasis and bronchiectasis I do not think that lobectomy will have a profound impact on his respiratory function but it will affected to some degree.  We do need to have an idea of his pulmonary reserve which will help with risk assessment.  He lost a significant amount of weight while hospitalized.  Given his current stability with no ongoing hemoptysis I think it would be in his benefit to wait around 6 to 8 weeks before doing a lobectomy.  That will give time for antibiotic treatment and improvement of his nutritional status.  I will plan to see him back in a month and check on his progress.  Plan: Pulmonary function testing (ordered by Dr. Gwenette Greet) Follow-up with Dr. Baird Cancer on Monday as scheduled Patient will return in 1 month with PA and lateral chest x-ray to to discuss timing of surgery.  Melrose Nakayama, MD [facility] [phone]  Pulmonary function testing 08/23/2018 from [facility] in [place] FVC 2.57 (61%) FEV1 1.16 (35%) FEV1 1.34 (44%) postbronchodilator TLC 4.62 (64%) RV 1.83 (66%) DLCO 11.92 (48%), corrects to 60%

## 2018-08-22 LAB — FUNGUS CULTURE WITH STAIN

## 2018-08-22 LAB — FUNGAL ORGANISM REFLEX

## 2018-08-22 LAB — FUNGUS CULTURE RESULT

## 2018-08-28 LAB — FUNGUS CULTURE WITH STAIN

## 2018-08-28 LAB — FUNGUS CULTURE RESULT

## 2018-08-28 LAB — FUNGAL ORGANISM REFLEX

## 2018-08-29 LAB — PULMONARY FUNCTION TEST

## 2018-09-09 LAB — ACID FAST CULTURE WITH REFLEXED SENSITIVITIES (MYCOBACTERIA): Acid Fast Culture: NEGATIVE

## 2018-09-14 LAB — ACID FAST CULTURE WITH REFLEXED SENSITIVITIES (MYCOBACTERIA): Acid Fast Culture: NEGATIVE

## 2018-09-17 ENCOUNTER — Other Ambulatory Visit: Payer: Self-pay | Admitting: Thoracic Surgery (Cardiothoracic Vascular Surgery)

## 2018-09-17 DIAGNOSIS — J189 Pneumonia, unspecified organism: Secondary | ICD-10-CM

## 2018-09-18 ENCOUNTER — Ambulatory Visit (INDEPENDENT_AMBULATORY_CARE_PROVIDER_SITE_OTHER): Payer: No Typology Code available for payment source | Admitting: Thoracic Surgery (Cardiothoracic Vascular Surgery)

## 2018-09-18 ENCOUNTER — Encounter: Payer: Self-pay | Admitting: Thoracic Surgery (Cardiothoracic Vascular Surgery)

## 2018-09-18 ENCOUNTER — Encounter: Payer: No Typology Code available for payment source | Admitting: Thoracic Surgery (Cardiothoracic Vascular Surgery)

## 2018-09-18 ENCOUNTER — Ambulatory Visit
Admission: RE | Admit: 2018-09-18 | Discharge: 2018-09-18 | Disposition: A | Discharge status: Home / Self Care | Payer: No Typology Code available for payment source | Source: Ambulatory Visit | Attending: Thoracic Surgery (Cardiothoracic Vascular Surgery) | Admitting: Thoracic Surgery (Cardiothoracic Vascular Surgery)

## 2018-09-18 ENCOUNTER — Other Ambulatory Visit: Payer: Self-pay

## 2018-09-18 VITALS — BP 163/71 | HR 77 | Temp 97.9°F | Resp 16 | Ht 70.5 in | Wt 151.0 lb

## 2018-09-18 DIAGNOSIS — J189 Pneumonia, unspecified organism: Secondary | ICD-10-CM

## 2018-09-18 DIAGNOSIS — R918 Other nonspecific abnormal finding of lung field: Secondary | ICD-10-CM | POA: Diagnosis not present

## 2018-09-18 DIAGNOSIS — J439 Emphysema, unspecified: Secondary | ICD-10-CM | POA: Diagnosis not present

## 2018-09-18 NOTE — H&P (View-Only) (Signed)
    [address]       [place] [address]             [phone]     HPI:Mr. Bauch returns to discuss possible right lower lobectomy.  Caylor Gunning is a 78-year-old gentleman with a history of tobacco abuse, COPD, bronchiectasis, recurrent pneumonia, recurrent hemoptysis, peripheral arterial disease, gastroesophageal reflux, and depression.  He has had multiple pneumonias over the past few years and has chronic bronchiectasis and atelectasis in the right lower lobe.  He was admitted in May with massive hemoptysis.  That resolved initially but he presented again in June with recurrent massive hemoptysis.  He underwent bronchial artery embolization.  He is chronically on Augmentin.  Cultures grew Candida albicans.  He saw Dr. Sanders who did not feel that needed antifungal treatment.  He has been afebrile.  He is not had any recurrent hemoptysis.  He is on 2 L nasal cannula chronically at home.  His appetite has improved.  He has gained about 5 pounds since his last visit.  Past Medical History:  Diagnosis Date  . Arthritis    "hands" (09/21/2016)  . Bleeding stomach ulcer 2000s  . COPD (chronic obstructive pulmonary disease) ([facility])   . Depression   . Pneumonia    "now and several times before this" (09/21/2016)  . PVD (peripheral vascular disease) ([facility]) 2014   prev PTCA on RLE    Current Outpatient Medications  Medication Sig Dispense Refill  . acetaminophen (TYLENOL) 500 MG tablet Take 500 mg by mouth 3 (three) times daily.    . acetaminophen (TYLENOL) 650 MG CR tablet Take 1 tablet (650 mg total) by mouth every 8 (eight) hours as needed for pain.    . albuterol (PROVENTIL) (2.5 MG/3ML) 0.083% nebulizer solution Take 3 mLs (2.5 mg total) by nebulization 2 (two) times a day for 30 days.    . albuterol (VENTOLIN HFA) 108 (90 Base) MCG/ACT inhaler Inhale 2 puffs into the lungs every 4 (four) hours as needed for wheezing or shortness of breath.    .  amoxicillin-clavulanate (AUGMENTIN) 875-125 MG tablet Take 1 tablet by mouth 2 (two) times daily.    . atorvastatin (LIPITOR) 20 MG tablet Take 20 mg by mouth at bedtime.    . budesonide-formoterol (SYMBICORT) 160-4.5 MCG/ACT inhaler Inhale 2 puffs into the lungs 2 (two) times daily.    . diclofenac sodium (VOLTAREN) 1 % GEL Apply topically 4 (four) times daily as needed.    . gabapentin (NEURONTIN) 300 MG capsule Take 300 mg by mouth at bedtime.     . lidocaine (LIDODERM) 5 % Place 1 patch onto the skin daily. Remove & Discard patch within 12 hours or as directed by MD    . losartan (COZAAR) 50 MG tablet Take 75 mg by mouth daily.    . Multiple Vitamin (MULTIVITAMIN WITH MINERALS) TABS tablet Take 1 tablet by mouth daily.    . OXYGEN Inhale 3 L into the lungs. DURING THE DAY and 2L HS    . sertraline (ZOLOFT) 100 MG tablet Take 200 mg by mouth daily.     . testosterone cypionate (DEPOTESTOTERONE CYPIONATE) 100 MG/ML injection Inject 200 mg into the muscle every 28 (twenty-eight) days.     . Tiotropium Bromide Monohydrate (SPIRIVA RESPIMAT) 2.5 MCG/ACT AERS Inhale 2 puffs into the lungs daily.    . traZODone (DESYREL) 50 MG tablet Take 50 mg by mouth at bedtime.    . vitamin B-12 (CYANOCOBALAMIN)   1000 MCG tablet Take 1,000 mcg by mouth daily.    . HYDROcodone-acetaminophen (NORCO) 10-325 MG tablet Take 1-2 tablets by mouth See admin instructions. Take 2 tablets by mouth every morning and 1 tablet at night     No current facility-administered medications for this visit.     Physical Exam BP (!) 163/71 (BP Location: Right Arm, Patient Position: Sitting, Cuff Size: Normal)   Pulse 77   Temp 97.9 F (36.6 C)   Resp 16   Ht 5' 10.5" (1.791 m)   Wt 151 lb (68.5 kg)   SpO2 96% Comment: 3L O2 DURING THE DAY and 2L hs  BMI 21.36 kg/m  78-year-old man in no acute distress Alert and oriented x3 with no focal deficits Wearing surgical mask No cervical adenopathy Cardiac regular rate and rhythm  Diminished breath sounds bilaterally, rhonchi right base Abdomen soft nontender No peripheral edema  Diagnostic Tests: CT ANGIOGRAPHY CHEST WITH CONTRAST  TECHNIQUE: Multidetector CT imaging of the chest was performed using the standard protocol during bolus administration of intravenous contrast. Multiplanar CT image reconstructions and MIPs were obtained to evaluate the vascular anatomy.  CONTRAST:  100mL OMNIPAQUE IOHEXOL 350 MG/ML SOLN  COMPARISON:  07/07/2018, 09/21/2016, 06/21/2016  FINDINGS: Cardiovascular:  Heart:  No cardiomegaly. No pericardial fluid/thickening. Calcifications of left main, left anterior descending, circumflex, right coronary arteries.  Aorta:  Ascending aorta measures 3.4 cm. No dissection. No periaortic fluid/inflammation. Mild atherosclerosis of the thoracic aorta. Greatest diameter at the hiatus measures 2.9 cm.  Three vessel arch with minimal atherosclerosis at the origins. Cervical arteries at the base of the neck are patent. Bilateral axillary arteries are patent.  The CT angiogram demonstrate 4 bronchial arteries/bronchial artery origins. There is no significant hypertrophy or appreciable tortuosity of any of the bronchial arteries.  Pulmonary arteries:  Main pulmonary artery measures 3.2 cm. There is relative enlargement of the pulmonary arteries as they extend toward the outer 1/3 of the lung, relatively larger than the accompanying bronchi.  No filling defects of the pulmonary arteries.  Mediastinum/Nodes: Redemonstration multiple lymph nodes of the mediastinal nodal stations. The largest of the lowest paratracheal nodes are unchanged from the comparison CT. Subcarinal nodes remain enlarged measuring 17 mm-18 mm. Unremarkable appearance of the thoracic esophagus.  Unremarkable thoracic inlet.  Lungs/Pleura: Compared to the recent CT of 07/07/2018 and the remote CT of 09/21/2016, there has been improved  aeration of the right lower lobe. Redemonstration of bronchiectasis of the right lower lobe, with some bronchi occluded with debris. There is associated partial volume loss in linear configuration along the bronchial vasculature. No evidence of extravasated contrast indicate active bleeding.  Redemonstration of centrilobular nodularity throughout the bilateral lungs. Regions of nodularity improved from the remote CT of 2018, particularly of the lower lobes,.  No pneumothorax or pleural effusion.  Upper Abdomen: No acute finding of the upper abdomen.  Musculoskeletal: No acute displaced fracture. Degenerative changes of the spine.  Review of the MIP images confirms the above findings.  IMPRESSION: No acute arterial abnormality.  Small bronchial arteries identified without significant tortuosity or engorgement. If the patient's reported hemoptysis is originating from bronchial artery source, the most likely site would be the right lower lobe, though bronchoscopy may be useful for confirming.  Improving aeration of the right lower lobe, with redemonstration of advanced bronchiectasis. Multiple bronchi of the right lower lobe demonstrate partial occlusion with debris/fluid, potentially blood products, or sequela of aspiration.  Redemonstration of centrilobular nodularity of the bilateral lungs, relatively   similar in distribution to the CT of 07/07/2018, perhaps slightly improved. Overall there is significant improvement to the prior CT of 2018. While chronic infection as previously discussed such as MAC would be leading differential, follow-up CT once the patient has been treated/recovered may be useful to assure stability, particularly of the lingular nodularity.  Mediastinal adenopathy persists, likely reactive.  Aortic Atherosclerosis (ICD10-I70.0).   Electronically Signed   By: Jaime  Wagner D.O.   On: 07/26/2018 14:51 I personally reviewed the chest CT  and agree with the findings noted above  Pulmonary function testing FVC 2.57 (61%) FEV1 1.10 (38%) FEV1 1.34 (44%) postbronchodilator TLC 4.62 (65%) RV 1.83 (66%) DLCO 11.92 (48%)  Impression: Mr. William Kirk is a 78-year-old gentleman with a history of tobacco abuse, COPD, bronchiectasis, recurrent pneumonia, recurrent hemoptysis, peripheral arterial disease, gastroesophageal reflux, and depression.  He presented in May with hemoptysis and then returned in June with massive hemoptysis requiring bronchial artery embolization.  He has chronic atelectasis and bronchiectasis in his right lower lobe.  He has had more pneumonias than he can count over the years and essentially is been on antibiotics almost continuously for the past couple of years.  We discussed the possibility of right VATS and possible right thoracotomy for right lower lobectomy.  This does not eliminate the possibility of him getting pneumonia.  But it does address the near certainty of recurrent pneumonias and that nonfunctional right lower lobe.  I discussed the general nature of the operation with Mr. and Mrs. Quayle.  They understand the general nature of the procedure including the incisions to be used, the use of drainage tubes postoperatively, the need for general anesthesia, the expected hospital stay, and the overall recovery.  I informed him of the indications, risk, benefits, and alternatives.  They understand that this operation is higher risk than the average lobectomy due to the recurrent infections and bacterial and fungal colonization.  They understand the risks include, but not limited to death, MI, DVT, PE, bleeding, possible need for transfusion, infection, prolonged air leak, bronchial stump breakdown, cardiac arrhythmias, as well as the possibility of other unforeseeable complications.  He accepts those risks and agrees to proceed.  Plan: Right VATS, possible right thoracotomy for right lower lobectomy on  Thursday, 10/18/2018  Blayne Frankie C Edem Tiegs, MD [facility] [phone]    

## 2018-09-18 NOTE — Progress Notes (Signed)
[address]       [place] [address]             [phone]     HPI:William Kirk returns to discuss possible right lower lobectomy.  William Kirk is a 78 year old gentleman with a history of tobacco abuse, COPD, bronchiectasis, recurrent pneumonia, recurrent hemoptysis, peripheral arterial disease, gastroesophageal reflux, and depression.  He has had multiple pneumonias over the past few years and has chronic bronchiectasis and atelectasis in the right lower lobe.  He was admitted in May with massive hemoptysis.  That resolved initially but he presented again in June with recurrent massive hemoptysis.  He underwent bronchial artery embolization.  He is chronically on Augmentin.  Cultures grew Candida albicans.  He saw Dr. Baird Cancer who did not feel that needed antifungal treatment.  He has been afebrile.  He is not had any recurrent hemoptysis.  He is on 2 L nasal cannula chronically at home.  His appetite has improved.  He has gained about 5 pounds since his last visit.  Past Medical History:  Diagnosis Date  . Arthritis    "hands" (09/21/2016)  . Bleeding stomach ulcer 2000s  . COPD (chronic obstructive pulmonary disease) ([place])   . Depression   . Pneumonia    "now and several times before this" (09/21/2016)  . PVD (peripheral vascular disease) ([place]) 2014   prev PTCA on RLE    Current Outpatient Medications  Medication Sig Dispense Refill  . acetaminophen (TYLENOL) 500 MG tablet Take 500 mg by mouth 3 (three) times daily.    Marland Kitchen acetaminophen (TYLENOL) 650 MG CR tablet Take 1 tablet (650 mg total) by mouth every 8 (eight) hours as needed for pain.    Marland Kitchen albuterol (PROVENTIL) (2.5 MG/3ML) 0.083% nebulizer solution Take 3 mLs (2.5 mg total) by nebulization 2 (two) times a day for 30 days.    Marland Kitchen albuterol (VENTOLIN HFA) 108 (90 Base) MCG/ACT inhaler Inhale 2 puffs into the lungs every 4 (four) hours as needed for wheezing or shortness of breath.    Marland Kitchen  amoxicillin-clavulanate (AUGMENTIN) 875-125 MG tablet Take 1 tablet by mouth 2 (two) times daily.    Marland Kitchen atorvastatin (LIPITOR) 20 MG tablet Take 20 mg by mouth at bedtime.    . budesonide-formoterol (SYMBICORT) 160-4.5 MCG/ACT inhaler Inhale 2 puffs into the lungs 2 (two) times daily.    . diclofenac sodium (VOLTAREN) 1 % GEL Apply topically 4 (four) times daily as needed.    . gabapentin (NEURONTIN) 300 MG capsule Take 300 mg by mouth at bedtime.     . lidocaine (LIDODERM) 5 % Place 1 patch onto the skin daily. Remove & Discard patch within 12 hours or as directed by MD    . losartan (COZAAR) 50 MG tablet Take 75 mg by mouth daily.    . Multiple Vitamin (MULTIVITAMIN WITH MINERALS) TABS tablet Take 1 tablet by mouth daily.    . OXYGEN Inhale 3 L into the lungs. DURING THE DAY and 2L HS    . sertraline (ZOLOFT) 100 MG tablet Take 200 mg by mouth daily.     Marland Kitchen testosterone cypionate (DEPOTESTOTERONE CYPIONATE) 100 MG/ML injection Inject 200 mg into the muscle every 28 (twenty-eight) days.     . Tiotropium Bromide Monohydrate (SPIRIVA RESPIMAT) 2.5 MCG/ACT AERS Inhale 2 puffs into the lungs daily.    . traZODone (DESYREL) 50 MG tablet Take 50 mg by mouth at bedtime.    . vitamin B-12 (CYANOCOBALAMIN)  1000 MCG tablet Take 1,000 mcg by mouth daily.    Marland Kitchen HYDROcodone-acetaminophen (NORCO) 10-325 MG tablet Take 1-2 tablets by mouth See admin instructions. Take 2 tablets by mouth every morning and 1 tablet at night     No current facility-administered medications for this visit.     Physical Exam BP (!) 163/71 (BP Location: Right Arm, Patient Position: Sitting, Cuff Size: Normal)   Pulse 77   Temp 97.9 F (36.6 C)   Resp 16   Ht 5' 10.5" (1.791 m)   Wt 151 lb (68.5 kg)   SpO2 96% Comment: 3L O2 DURING THE DAY and 2L hs  BMI 21.64 kg/m  78 year old man in no acute distress Alert and oriented x3 with no focal deficits Wearing surgical mask No cervical adenopathy Cardiac regular rate and rhythm  Diminished breath sounds bilaterally, rhonchi right base Abdomen soft nontender No peripheral edema  Diagnostic Tests: CT ANGIOGRAPHY CHEST WITH CONTRAST  TECHNIQUE: Multidetector CT imaging of the chest was performed using the standard protocol during bolus administration of intravenous contrast. Multiplanar CT image reconstructions and MIPs were obtained to evaluate the vascular anatomy.  CONTRAST:  170m OMNIPAQUE IOHEXOL 350 MG/ML SOLN  COMPARISON:  07/07/2018, 09/21/2016, 06/21/2016  FINDINGS: Cardiovascular:  Heart:  No cardiomegaly. No pericardial fluid/thickening. Calcifications of left main, left anterior descending, circumflex, right coronary arteries.  Aorta:  Ascending aorta measures 3.4 cm. No dissection. No periaortic fluid/inflammation. Mild atherosclerosis of the thoracic aorta. Greatest diameter at the hiatus measures 2.9 cm.  Three vessel arch with minimal atherosclerosis at the origins. Cervical arteries at the base of the neck are patent. Bilateral axillary arteries are patent.  The CT angiogram demonstrate 4 bronchial arteries/bronchial artery origins. There is no significant hypertrophy or appreciable tortuosity of any of the bronchial arteries.  Pulmonary arteries:  Main pulmonary artery measures 3.2 cm. There is relative enlargement of the pulmonary arteries as they extend toward the outer 1/3 of the lung, relatively larger than the accompanying bronchi.  No filling defects of the pulmonary arteries.  Mediastinum/Nodes: Redemonstration multiple lymph nodes of the mediastinal nodal stations. The largest of the lowest paratracheal nodes are unchanged from the comparison CT. Subcarinal nodes remain enlarged measuring 17 mm-18 mm. Unremarkable appearance of the thoracic esophagus.  Unremarkable thoracic inlet.  Lungs/Pleura: Compared to the recent CT of 07/07/2018 and the remote CT of 09/21/2016, there has been improved  aeration of the right lower lobe. Redemonstration of bronchiectasis of the right lower lobe, with some bronchi occluded with debris. There is associated partial volume loss in linear configuration along the bronchial vasculature. No evidence of extravasated contrast indicate active bleeding.  Redemonstration of centrilobular nodularity throughout the bilateral lungs. Regions of nodularity improved from the remote CT of 2018, particularly of the lower lobes,.  No pneumothorax or pleural effusion.  Upper Abdomen: No acute finding of the upper abdomen.  Musculoskeletal: No acute displaced fracture. Degenerative changes of the spine.  Review of the MIP images confirms the above findings.  IMPRESSION: No acute arterial abnormality.  Small bronchial arteries identified without significant tortuosity or engorgement. If the patient's reported hemoptysis is originating from bronchial artery source, the most likely site would be the right lower lobe, though bronchoscopy may be useful for confirming.  Improving aeration of the right lower lobe, with redemonstration of advanced bronchiectasis. Multiple bronchi of the right lower lobe demonstrate partial occlusion with debris/fluid, potentially blood products, or sequela of aspiration.  Redemonstration of centrilobular nodularity of the bilateral lungs, relatively  similar in distribution to the CT of 07/07/2018, perhaps slightly improved. Overall there is significant improvement to the prior CT of 2018. While chronic infection as previously discussed such as MAC would be leading differential, follow-up CT once the patient has been treated/recovered may be useful to assure stability, particularly of the lingular nodularity.  Mediastinal adenopathy persists, likely reactive.  Aortic Atherosclerosis (ICD10-I70.0).   Electronically Signed   By: Corrie Mckusick D.O.   On: 07/26/2018 14:51 I personally reviewed the chest CT  and agree with the findings noted above  Pulmonary function testing FVC 2.57 (61%) FEV1 1.10 (38%) FEV1 1.34 (44%) postbronchodilator TLC 4.62 (65%) RV 1.83 (66%) DLCO 11.92 (48%)  Impression: William Kirk is a 78 year old gentleman with a history of tobacco abuse, COPD, bronchiectasis, recurrent pneumonia, recurrent hemoptysis, peripheral arterial disease, gastroesophageal reflux, and depression.  He presented in May with hemoptysis and then returned in June with massive hemoptysis requiring bronchial artery embolization.  He has chronic atelectasis and bronchiectasis in his right lower lobe.  He has had more pneumonias than he can count over the years and essentially is been on antibiotics almost continuously for the past couple of years.  We discussed the possibility of right VATS and possible right thoracotomy for right lower lobectomy.  This does not eliminate the possibility of him getting pneumonia.  But it does address the near certainty of recurrent pneumonias and that nonfunctional right lower lobe.  I discussed the general nature of the operation with Mr. and Mrs. Stonehouse.  They understand the general nature of the procedure including the incisions to be used, the use of drainage tubes postoperatively, the need for general anesthesia, the expected hospital stay, and the overall recovery.  I informed him of the indications, risk, benefits, and alternatives.  They understand that this operation is higher risk than the average lobectomy due to the recurrent infections and bacterial and fungal colonization.  They understand the risks include, but not limited to death, MI, DVT, PE, bleeding, possible need for transfusion, infection, prolonged air leak, bronchial stump breakdown, cardiac arrhythmias, as well as the possibility of other unforeseeable complications.  He accepts those risks and agrees to proceed.  Plan: Right VATS, possible right thoracotomy for right lower lobectomy on  Thursday, 10/18/2018  Melrose Nakayama, MD [facility] [phone]

## 2018-09-19 ENCOUNTER — Other Ambulatory Visit: Payer: Self-pay

## 2018-09-19 DIAGNOSIS — J479 Bronchiectasis, uncomplicated: Secondary | ICD-10-CM

## 2018-10-12 NOTE — Pre-Procedure Instructions (Signed)
William Kirk  10/12/2018      [place], [place] - [place] [id number] [place] [address] Phone: [phone] Fax: [phone]  [id number] - [place], [place] [address] [place] [address] Phone: [phone] Fax: [phone]    Your procedure is scheduled on 10/18/18.  Report to [facility] Admitting at 6 A.M.  Call this number if you have problems the morning of surgery:  [phone]   Remember:  Do not eat or drink after midnight.  Y    Take these medicines the morning of surgery with A SIP OF WATER ----TYLENOL,ALL INHALERS,NEURONTIN,HYDROCODONE,ZOLOFT    Do not wear jewelry, make-up or nail polish.  Do not wear lotions, powders, or perfumes, or deodorant.  Do not shave 48 hours prior to surgery.  Men may shave face and neck.  Do not bring valuables to the hospital.  [facility] is not responsible for any belongings or valuables.  Contacts, dentures or bridgework may not be worn into surgery.  Leave your suitcase in the car.  After surgery it may be brought to your room.  For patients admitted to the hospital, discharge time will be determined by your treatment team.  Patients discharged the day of surgery will not be allowed to drive home.   Special instructions:  Do not take any aspirin,anti-inflammatories,vitamins,or herbal supplements 5-7 days prior to surgery.[facility] - Preparing for Surgery  Before surgery, you can play an important role.  Because skin is not sterile, your skin needs to be as free of germs as possible.  You can reduce the number of germs on you skin by washing with CHG (chlorahexidine gluconate) soap before surgery.  CHG is an antiseptic cleaner which kills germs and bonds with the skin to continue killing germs even after washing.  Oral Hygiene is also important in reducing the risk of infection.  Remember to  brush your teeth with your regular toothpaste the morning of surgery.  Please DO NOT use if you have an allergy to CHG or antibacterial soaps.  If your skin becomes reddened/irritated stop using the CHG and inform your nurse when you arrive at Short Stay.  Do not shave (including legs and underarms) for at least 48 hours prior to the first CHG shower.  You may shave your face.  Please follow these instructions carefully:   1.  Shower with CHG Soap the night before surgery and the morning of Surgery.  2.  If you choose to wash your hair, wash your hair first as usual with your normal shampoo.  3.  After you shampoo, rinse your hair and body thoroughly to remove the shampoo. 4.  Use CHG as you would any other liquid soap.  You can apply chg directly to the skin and wash gently with a      scrungie or washcloth.           5.  Apply the CHG Soap to your body ONLY FROM THE NECK DOWN.   Do not use on open wounds or open sores. Avoid contact with your eyes, ears, mouth and genitals (private parts).  Wash genitals (private parts) with your normal soap.  6.  Wash thoroughly, paying special attention to the area where your surgery will be performed.  7.  Thoroughly rinse your body with warm water from the neck down.  8.  DO NOT shower/wash with your normal soap after  using and rinsing off the CHG Soap.  9.  Pat yourself dry with a clean towel.            10.  Wear clean pajamas.            11.  Place clean sheets on your bed the night of your first shower and do not sleep with pets.  Day of Surgery  Do not apply any lotions/deoderants the morning of surgery.   Please wear clean clothes to the hospital/surgery center. Remember to brush your teeth with toothpaste.    Please read over the following fact sheets that you were given. MRSA Information

## 2018-10-16 ENCOUNTER — Other Ambulatory Visit: Payer: Self-pay

## 2018-10-16 ENCOUNTER — Encounter (HOSPITAL_COMMUNITY): Payer: Self-pay

## 2018-10-16 ENCOUNTER — Ambulatory Visit (HOSPITAL_COMMUNITY)
Admission: RE | Admit: 2018-10-16 | Discharge: 2018-10-16 | Disposition: A | Discharge status: Home / Self Care | Payer: No Typology Code available for payment source | Source: Ambulatory Visit | Attending: Thoracic Surgery (Cardiothoracic Vascular Surgery) | Admitting: Thoracic Surgery (Cardiothoracic Vascular Surgery)

## 2018-10-16 ENCOUNTER — Other Ambulatory Visit (HOSPITAL_COMMUNITY)
Admission: RE | Admit: 2018-10-16 | Discharge: 2018-10-16 | Disposition: A | Discharge status: Home / Self Care | Payer: No Typology Code available for payment source | Source: Ambulatory Visit | Attending: Thoracic Surgery (Cardiothoracic Vascular Surgery) | Admitting: Thoracic Surgery (Cardiothoracic Vascular Surgery)

## 2018-10-16 ENCOUNTER — Encounter (HOSPITAL_COMMUNITY)
Admission: RE | Admit: 2018-10-16 | Discharge: 2018-10-16 | Disposition: A | Discharge status: Home / Self Care | Payer: No Typology Code available for payment source | Source: Ambulatory Visit | Attending: Thoracic Surgery (Cardiothoracic Vascular Surgery) | Admitting: Thoracic Surgery (Cardiothoracic Vascular Surgery)

## 2018-10-16 DIAGNOSIS — J479 Bronchiectasis, uncomplicated: Secondary | ICD-10-CM

## 2018-10-16 DIAGNOSIS — Z20828 Contact with and (suspected) exposure to other viral communicable diseases: Secondary | ICD-10-CM | POA: Insufficient documentation

## 2018-10-16 HISTORY — DX: Cough, unspecified: R05.9

## 2018-10-16 HISTORY — DX: Essential (primary) hypertension: I10

## 2018-10-16 HISTORY — DX: Anxiety disorder, unspecified: F41.9

## 2018-10-16 HISTORY — DX: Dyspnea, unspecified: R06.00

## 2018-10-16 LAB — CBC
HCT: 40.4 % (ref 39.0–52.0)
Hemoglobin: 12 g/dL — ABNORMAL LOW (ref 13.0–17.0)
MCH: 26.2 pg (ref 26.0–34.0)
MCHC: 29.7 g/dL — ABNORMAL LOW (ref 30.0–36.0)
MCV: 88.2 fL (ref 80.0–100.0)
Platelets: 386 10*3/uL (ref 150–400)
RBC: 4.58 MIL/uL (ref 4.22–5.81)
RDW: 16.2 % — ABNORMAL HIGH (ref 11.5–15.5)
WBC: 11.3 10*3/uL — ABNORMAL HIGH (ref 4.0–10.5)
nRBC: 0 % (ref 0.0–0.2)

## 2018-10-16 LAB — COMPREHENSIVE METABOLIC PANEL
ALT: 21 U/L (ref 0–44)
AST: 25 U/L (ref 15–41)
Albumin: 3.2 g/dL — ABNORMAL LOW (ref 3.5–5.0)
Alkaline Phosphatase: 91 U/L (ref 38–126)
Anion gap: 8 (ref 5–15)
BUN: 18 mg/dL (ref 8–23)
CO2: 21 mmol/L — ABNORMAL LOW (ref 22–32)
Calcium: 8.8 mg/dL — ABNORMAL LOW (ref 8.9–10.3)
Chloride: 111 mmol/L (ref 98–111)
Creatinine, Ser: 1.59 mg/dL — ABNORMAL HIGH (ref 0.61–1.24)
GFR calc Af Amer: 48 mL/min — ABNORMAL LOW (ref >60)
GFR calc non Af Amer: 42 mL/min — ABNORMAL LOW (ref >60)
Glucose, Bld: 88 mg/dL (ref 70–99)
Potassium: 3.9 mmol/L (ref 3.5–5.1)
Sodium: 140 mmol/L (ref 135–145)
Total Bilirubin: 0.1 mg/dL — ABNORMAL LOW (ref 0.3–1.2)
Total Protein: 7.8 g/dL (ref 6.5–8.1)

## 2018-10-16 LAB — URINALYSIS, ROUTINE W REFLEX MICROSCOPIC
Bilirubin Urine: NEGATIVE
Glucose, UA: NEGATIVE mg/dL
Hgb urine dipstick: NEGATIVE
Ketones, ur: NEGATIVE mg/dL
Nitrite: NEGATIVE
Protein, ur: NEGATIVE mg/dL
Specific Gravity, Urine: 1.013 (ref 1.005–1.030)
pH: 5 (ref 5.0–8.0)

## 2018-10-16 LAB — SARS CORONAVIRUS 2 (TAT 6-24 HRS): SARS Coronavirus 2: NEGATIVE

## 2018-10-16 LAB — PROTIME-INR
INR: 1.1 (ref 0.8–1.2)
Prothrombin Time: 14.1 seconds (ref 11.4–15.2)

## 2018-10-16 LAB — SURGICAL PCR SCREEN
MRSA, PCR: NEGATIVE
Staphylococcus aureus: NEGATIVE

## 2018-10-16 LAB — APTT: aPTT: 29 seconds (ref 24–36)

## 2018-10-17 ENCOUNTER — Encounter (HOSPITAL_COMMUNITY): Payer: Self-pay | Admitting: Anesthesiology

## 2018-10-17 LAB — TYPE AND SCREEN
ABO/RH(D): O POS
Antibody Screen: NEGATIVE

## 2018-10-17 NOTE — Anesthesia Preprocedure Evaluation (Addendum)
Anesthesia Evaluation  Patient identified by MRN, date of birth, ID band Patient awake    Reviewed: Allergy & Precautions, NPO status , Patient's Chart, lab work & pertinent test results  Airway Mallampati: II  TM Distance: >3 FB Neck ROM: Full    Dental  (+) Upper Dentures, Lower Dentures   Pulmonary shortness of breath and with exertion, pneumonia, COPD,  COPD inhaler, former smoker,  RLL bronchiectasis    + decreased breath sounds  rales    Cardiovascular hypertension, Pt. on medications + Peripheral Vascular Disease  Normal cardiovascular exam Rhythm:Regular Rate:Normal  Echo 09/26/2016 Left ventricle: The cavity size was normal. Wall thickness was normal. Systolic function was normal. The estimated ejection  fraction was in the range of 50% to 55%. Wall motion was normal; there were no regional wall motion abnormalities. Doppler  parameters are consistent with abnormal left ventricular relaxation (grade 1 diastolic dysfunction). - Mitral valve: There was mild regurgitation. - Pulmonary arteries: Systolic pressure was mildly increased. PA peak pressure: 35 mm Hg (S).      Neuro/Psych PSYCHIATRIC DISORDERS Anxiety Depression negative neurological ROS     GI/Hepatic Neg liver ROS, PUD,   Endo/Other  negative endocrine ROSHLD  Renal/GU Renal InsufficiencyRenal disease  negative genitourinary   Musculoskeletal  (+) Arthritis , Osteoarthritis,    Abdominal   Peds  Hematology  (+) anemia ,   Anesthesia Other Findings   Reproductive/Obstetrics                           Anesthesia Physical Anesthesia Plan  ASA: III  Anesthesia Plan: General   Post-op Pain Management:    Induction: Intravenous  PONV Risk Score and Plan: 3 and Ondansetron and Treatment may vary due to age or medical condition  Airway Management Planned: Double Lumen EBT  Additional Equipment: Arterial line  Intra-op  Plan:   Post-operative Plan: Extubation in OR  Informed Consent: I have reviewed the patients History and Physical, chart, labs and discussed the procedure including the risks, benefits and alternatives for the proposed anesthesia with the patient or authorized representative who has indicated his/her understanding and acceptance.     Dental advisory given  Plan Discussed with: CRNA and Surgeon  Anesthesia Plan Comments:        Anesthesia Quick Evaluation

## 2018-10-18 ENCOUNTER — Encounter (HOSPITAL_COMMUNITY)
Admission: RE | Disposition: A | Discharge status: Home / Self Care | Payer: Self-pay | Source: Home / Self Care | Attending: Thoracic Surgery (Cardiothoracic Vascular Surgery)

## 2018-10-18 ENCOUNTER — Encounter (HOSPITAL_COMMUNITY): Payer: Self-pay

## 2018-10-18 ENCOUNTER — Other Ambulatory Visit: Payer: Self-pay

## 2018-10-18 ENCOUNTER — Inpatient Hospital Stay (HOSPITAL_COMMUNITY): Payer: No Typology Code available for payment source

## 2018-10-18 ENCOUNTER — Inpatient Hospital Stay (HOSPITAL_COMMUNITY): Payer: No Typology Code available for payment source | Admitting: Certified Registered Nurse Anesthetist

## 2018-10-18 ENCOUNTER — Inpatient Hospital Stay (HOSPITAL_COMMUNITY)
Admission: RE | Admit: 2018-10-18 | Discharge: 2018-10-25 | DRG: 164 | Disposition: A | Discharge status: Home / Self Care | Payer: No Typology Code available for payment source | Attending: Thoracic Surgery (Cardiothoracic Vascular Surgery) | Admitting: Thoracic Surgery (Cardiothoracic Vascular Surgery)

## 2018-10-18 DIAGNOSIS — I739 Peripheral vascular disease, unspecified: Secondary | ICD-10-CM | POA: Diagnosis present

## 2018-10-18 DIAGNOSIS — Z7951 Long term (current) use of inhaled steroids: Secondary | ICD-10-CM | POA: Diagnosis not present

## 2018-10-18 DIAGNOSIS — Z9981 Dependence on supplemental oxygen: Secondary | ICD-10-CM | POA: Diagnosis not present

## 2018-10-18 DIAGNOSIS — J479 Bronchiectasis, uncomplicated: Secondary | ICD-10-CM | POA: Diagnosis not present

## 2018-10-18 DIAGNOSIS — I482 Chronic atrial fibrillation, unspecified: Secondary | ICD-10-CM | POA: Diagnosis not present

## 2018-10-18 DIAGNOSIS — J9811 Atelectasis: Secondary | ICD-10-CM | POA: Diagnosis present

## 2018-10-18 DIAGNOSIS — J939 Pneumothorax, unspecified: Secondary | ICD-10-CM

## 2018-10-18 DIAGNOSIS — I97191 Other postprocedural cardiac functional disturbances following other surgery: Secondary | ICD-10-CM | POA: Diagnosis not present

## 2018-10-18 DIAGNOSIS — D62 Acute posthemorrhagic anemia: Secondary | ICD-10-CM | POA: Diagnosis present

## 2018-10-18 DIAGNOSIS — Z20828 Contact with and (suspected) exposure to other viral communicable diseases: Secondary | ICD-10-CM | POA: Diagnosis present

## 2018-10-18 DIAGNOSIS — T8182XA Emphysema (subcutaneous) resulting from a procedure, initial encounter: Secondary | ICD-10-CM | POA: Diagnosis not present

## 2018-10-18 DIAGNOSIS — T859XXA Unspecified complication of internal prosthetic device, implant and graft, initial encounter: Secondary | ICD-10-CM

## 2018-10-18 DIAGNOSIS — Z8711 Personal history of peptic ulcer disease: Secondary | ICD-10-CM | POA: Diagnosis not present

## 2018-10-18 DIAGNOSIS — Z791 Long term (current) use of non-steroidal anti-inflammatories (NSAID): Secondary | ICD-10-CM

## 2018-10-18 DIAGNOSIS — Z79899 Other long term (current) drug therapy: Secondary | ICD-10-CM

## 2018-10-18 DIAGNOSIS — Z4682 Encounter for fitting and adjustment of non-vascular catheter: Secondary | ICD-10-CM | POA: Diagnosis not present

## 2018-10-18 DIAGNOSIS — J470 Bronchiectasis with acute lower respiratory infection: Secondary | ICD-10-CM | POA: Diagnosis present

## 2018-10-18 DIAGNOSIS — Z87891 Personal history of nicotine dependence: Secondary | ICD-10-CM

## 2018-10-18 DIAGNOSIS — J439 Emphysema, unspecified: Secondary | ICD-10-CM | POA: Diagnosis not present

## 2018-10-18 DIAGNOSIS — F329 Major depressive disorder, single episode, unspecified: Secondary | ICD-10-CM | POA: Diagnosis present

## 2018-10-18 DIAGNOSIS — R0602 Shortness of breath: Secondary | ICD-10-CM | POA: Diagnosis not present

## 2018-10-18 DIAGNOSIS — M1990 Unspecified osteoarthritis, unspecified site: Secondary | ICD-10-CM | POA: Diagnosis present

## 2018-10-18 DIAGNOSIS — Z23 Encounter for immunization: Secondary | ICD-10-CM

## 2018-10-18 DIAGNOSIS — R042 Hemoptysis: Secondary | ICD-10-CM | POA: Diagnosis present

## 2018-10-18 DIAGNOSIS — F419 Anxiety disorder, unspecified: Secondary | ICD-10-CM | POA: Diagnosis present

## 2018-10-18 DIAGNOSIS — J189 Pneumonia, unspecified organism: Secondary | ICD-10-CM | POA: Diagnosis present

## 2018-10-18 DIAGNOSIS — J95811 Postprocedural pneumothorax: Secondary | ICD-10-CM | POA: Diagnosis not present

## 2018-10-18 DIAGNOSIS — Z902 Acquired absence of lung [part of]: Secondary | ICD-10-CM

## 2018-10-18 DIAGNOSIS — I10 Essential (primary) hypertension: Secondary | ICD-10-CM | POA: Diagnosis present

## 2018-10-18 HISTORY — PX: VIDEO ASSISTED THORACOSCOPY (VATS)/ LOBECTOMY: SHX6169

## 2018-10-18 LAB — GLUCOSE, CAPILLARY
Glucose-Capillary: 155 mg/dL — ABNORMAL HIGH (ref 70–99)
Glucose-Capillary: 98 mg/dL (ref 70–99)

## 2018-10-18 SURGERY — VIDEO ASSISTED THORACOSCOPY (VATS)/ LOBECTOMY
Anesthesia: General | Site: Chest | Laterality: Right

## 2018-10-18 MED ORDER — FENTANYL CITRATE (PF) 100 MCG/2ML IJ SOLN
INTRAMUSCULAR | Status: DC | PRN
  Administered 2018-10-18 (×3): 50 ug via INTRAVENOUS
  Administered 2018-10-18: 150 ug via INTRAVENOUS

## 2018-10-18 MED ORDER — FENTANYL 40 MCG/ML IV SOLN
INTRAVENOUS | Status: DC
  Administered 2018-10-18 (×2): 1000 ug via INTRAVENOUS
  Administered 2018-10-19: 30 ug via INTRAVENOUS
  Administered 2018-10-19 (×3): 10 ug via INTRAVENOUS
  Administered 2018-10-20: 0 ug via INTRAVENOUS
  Administered 2018-10-20 (×2): 10 ug via INTRAVENOUS
  Administered 2018-10-20: 20 ug via INTRAVENOUS
  Administered 2018-10-20 (×2): 10 ug via INTRAVENOUS
  Administered 2018-10-21: 0 ug via INTRAVENOUS
  Administered 2018-10-21: 10 ug via INTRAVENOUS
  Administered 2018-10-21: 0 ug via INTRAVENOUS
  Filled 2018-10-18: qty 25
  Filled 2018-10-18: qty 1000

## 2018-10-18 MED ORDER — ORAL CARE MOUTH RINSE
15.0000 mL | Freq: Two times a day (BID) | OROMUCOSAL | Status: DC
  Administered 2018-10-19 – 2018-10-25 (×11): 15 mL via OROMUCOSAL

## 2018-10-18 MED ORDER — ACETAMINOPHEN 160 MG/5ML PO SOLN
1000.0000 mg | Freq: Four times a day (QID) | ORAL | Status: AC
  Filled 2018-10-18: qty 40.6

## 2018-10-18 MED ORDER — SODIUM CHLORIDE 0.9% FLUSH: 9.0000 mL | INTRAVENOUS | Status: DC | PRN

## 2018-10-18 MED ORDER — LACTATED RINGERS IV SOLN
INTRAVENOUS | Status: DC | PRN
  Administered 2018-10-18: 07:00:00 via INTRAVENOUS

## 2018-10-18 MED ORDER — ADULT MULTIVITAMIN W/MINERALS CH
1.0000 | ORAL_TABLET | Freq: Every day | ORAL | Status: DC
  Administered 2018-10-19 – 2018-10-25 (×7): 1 via ORAL
  Filled 2018-10-18 (×7): qty 1

## 2018-10-18 MED ORDER — ACETAMINOPHEN 500 MG PO TABS
1000.0000 mg | ORAL_TABLET | Freq: Four times a day (QID) | ORAL | Status: AC
  Administered 2018-10-18 – 2018-10-22 (×15): 1000 mg via ORAL
  Filled 2018-10-18 (×15): qty 2

## 2018-10-18 MED ORDER — PROPOFOL 10 MG/ML IV BOLUS
INTRAVENOUS | Status: AC
  Filled 2018-10-18: qty 20

## 2018-10-18 MED ORDER — BUPIVACAINE HCL (PF) 0.5 % IJ SOLN
INTRAMUSCULAR | Status: AC
  Filled 2018-10-18: qty 30

## 2018-10-18 MED ORDER — SODIUM CHLORIDE (PF) 0.9 % IJ SOLN
INTRAMUSCULAR | Status: DC | PRN
  Administered 2018-10-18: 50 mL

## 2018-10-18 MED ORDER — PHENYLEPHRINE 40 MCG/ML (10ML) SYRINGE FOR IV PUSH (FOR BLOOD PRESSURE SUPPORT)
PREFILLED_SYRINGE | INTRAVENOUS | Status: AC
  Filled 2018-10-18: qty 10

## 2018-10-18 MED ORDER — INSULIN ASPART 100 UNIT/ML ~~LOC~~ SOLN: 0.0000 [IU] | Freq: Four times a day (QID) | SUBCUTANEOUS | Status: DC

## 2018-10-18 MED ORDER — BUPIVACAINE LIPOSOME 1.3 % IJ SUSP
INTRAMUSCULAR | Status: DC | PRN
  Administered 2018-10-18: 20 mL

## 2018-10-18 MED ORDER — NALOXONE HCL 0.4 MG/ML IJ SOLN: 0.4000 mg | INTRAMUSCULAR | Status: DC | PRN

## 2018-10-18 MED ORDER — ATORVASTATIN CALCIUM 10 MG PO TABS
20.0000 mg | ORAL_TABLET | Freq: Every day | ORAL | Status: DC
  Administered 2018-10-19 – 2018-10-24 (×6): 20 mg via ORAL
  Filled 2018-10-18 (×6): qty 2

## 2018-10-18 MED ORDER — LIDOCAINE HCL 4 % EX SOLN
CUTANEOUS | Status: DC | PRN
  Administered 2018-10-18: 4 mL via TOPICAL

## 2018-10-18 MED ORDER — BUPIVACAINE HCL 0.5 % IJ SOLN
INTRAMUSCULAR | Status: AC
  Filled 2018-10-18: qty 1

## 2018-10-18 MED ORDER — PHENYLEPHRINE HCL-NACL 10-0.9 MG/250ML-% IV SOLN
INTRAVENOUS | Status: AC
  Filled 2018-10-18: qty 250

## 2018-10-18 MED ORDER — CHLORHEXIDINE GLUCONATE CLOTH 2 % EX PADS
6.0000 | MEDICATED_PAD | Freq: Every day | CUTANEOUS | Status: DC
  Administered 2018-10-19 – 2018-10-20 (×2): 6 via TOPICAL

## 2018-10-18 MED ORDER — CEFAZOLIN SODIUM-DEXTROSE 2-4 GM/100ML-% IV SOLN
2.0000 g | Freq: Three times a day (TID) | INTRAVENOUS | Status: AC
  Administered 2018-10-18 – 2018-10-19 (×2): 2 g via INTRAVENOUS
  Filled 2018-10-18 (×2): qty 100

## 2018-10-18 MED ORDER — SODIUM CHLORIDE 0.9 % IV SOLN
INTRAVENOUS | Status: DC | PRN
  Administered 2018-10-18: 70 ug/min via INTRAVENOUS

## 2018-10-18 MED ORDER — ENOXAPARIN SODIUM 40 MG/0.4ML ~~LOC~~ SOLN
40.0000 mg | SUBCUTANEOUS | Status: DC
  Administered 2018-10-19 – 2018-10-24 (×5): 40 mg via SUBCUTANEOUS
  Filled 2018-10-18 (×6): qty 0.4

## 2018-10-18 MED ORDER — ONDANSETRON HCL 4 MG/2ML IJ SOLN: 4.0000 mg | Freq: Four times a day (QID) | INTRAMUSCULAR | Status: DC | PRN

## 2018-10-18 MED ORDER — TRAZODONE HCL 50 MG PO TABS
50.0000 mg | ORAL_TABLET | Freq: Every day | ORAL | Status: DC
  Administered 2018-10-18 – 2018-10-24 (×7): 50 mg via ORAL
  Filled 2018-10-18 (×7): qty 1

## 2018-10-18 MED ORDER — LACTATED RINGERS IV SOLN
INTRAVENOUS | Status: DC | PRN
  Administered 2018-10-18: 06:00:00 via INTRAVENOUS

## 2018-10-18 MED ORDER — ALBUTEROL SULFATE (2.5 MG/3ML) 0.083% IN NEBU
3.0000 mL | INHALATION_SOLUTION | RESPIRATORY_TRACT | Status: DC | PRN
  Administered 2018-10-20: 16:00:00 3 mL via RESPIRATORY_TRACT
  Filled 2018-10-18: qty 3

## 2018-10-18 MED ORDER — LIDOCAINE 5 % EX PTCH
1.0000 | MEDICATED_PATCH | CUTANEOUS | Status: DC
  Filled 2018-10-18: qty 1

## 2018-10-18 MED ORDER — FENTANYL CITRATE (PF) 100 MCG/2ML IJ SOLN: 25.0000 ug | INTRAMUSCULAR | Status: DC | PRN

## 2018-10-18 MED ORDER — 0.9 % SODIUM CHLORIDE (POUR BTL) OPTIME
TOPICAL | Status: DC | PRN
  Administered 2018-10-18: 3000 mL

## 2018-10-18 MED ORDER — DIPHENHYDRAMINE HCL 12.5 MG/5ML PO ELIX
12.5000 mg | ORAL_SOLUTION | Freq: Four times a day (QID) | ORAL | Status: DC | PRN
  Filled 2018-10-18: qty 5

## 2018-10-18 MED ORDER — PHENYLEPHRINE 40 MCG/ML (10ML) SYRINGE FOR IV PUSH (FOR BLOOD PRESSURE SUPPORT)
PREFILLED_SYRINGE | INTRAVENOUS | Status: DC | PRN
  Administered 2018-10-18: 80 ug via INTRAVENOUS
  Administered 2018-10-18: 120 ug via INTRAVENOUS
  Administered 2018-10-18: 80 ug via INTRAVENOUS

## 2018-10-18 MED ORDER — HEMOSTATIC AGENTS (NO CHARGE) OPTIME
TOPICAL | Status: DC | PRN
  Administered 2018-10-18: 1 via TOPICAL

## 2018-10-18 MED ORDER — HYDROMORPHONE HCL 1 MG/ML IJ SOLN: 0.2500 mg | INTRAMUSCULAR | Status: DC | PRN

## 2018-10-18 MED ORDER — MOMETASONE FURO-FORMOTEROL FUM 200-5 MCG/ACT IN AERO
2.0000 | INHALATION_SPRAY | Freq: Two times a day (BID) | RESPIRATORY_TRACT | Status: DC
  Administered 2018-10-19 – 2018-10-25 (×12): 2 via RESPIRATORY_TRACT
  Filled 2018-10-18: qty 8.8

## 2018-10-18 MED ORDER — DEXAMETHASONE SODIUM PHOSPHATE 10 MG/ML IJ SOLN
INTRAMUSCULAR | Status: AC
  Filled 2018-10-18: qty 1

## 2018-10-18 MED ORDER — OXYCODONE HCL 5 MG PO TABS: 5.0000 mg | ORAL_TABLET | ORAL | Status: DC | PRN

## 2018-10-18 MED ORDER — MIDAZOLAM HCL 5 MG/5ML IJ SOLN
INTRAMUSCULAR | Status: DC | PRN
  Administered 2018-10-18: 2 mg via INTRAVENOUS

## 2018-10-18 MED ORDER — FENTANYL CITRATE (PF) 250 MCG/5ML IJ SOLN
INTRAMUSCULAR | Status: AC
  Filled 2018-10-18: qty 5

## 2018-10-18 MED ORDER — TRAMADOL HCL 50 MG PO TABS: 50.0000 mg | ORAL_TABLET | Freq: Four times a day (QID) | ORAL | Status: DC | PRN

## 2018-10-18 MED ORDER — BISACODYL 5 MG PO TBEC
10.0000 mg | DELAYED_RELEASE_TABLET | Freq: Every day | ORAL | Status: DC
  Administered 2018-10-18 – 2018-10-24 (×5): 10 mg via ORAL
  Filled 2018-10-18 (×6): qty 2

## 2018-10-18 MED ORDER — LIDOCAINE 2% (20 MG/ML) 5 ML SYRINGE
INTRAMUSCULAR | Status: AC
  Filled 2018-10-18: qty 5

## 2018-10-18 MED ORDER — ACETAMINOPHEN 500 MG PO TABS: 1000.0000 mg | ORAL_TABLET | Freq: Once | ORAL | Status: DC

## 2018-10-18 MED ORDER — ROCURONIUM BROMIDE 10 MG/ML (PF) SYRINGE
PREFILLED_SYRINGE | INTRAVENOUS | Status: AC
  Filled 2018-10-18: qty 10

## 2018-10-18 MED ORDER — DIPHENHYDRAMINE HCL 50 MG/ML IJ SOLN: 12.5000 mg | Freq: Four times a day (QID) | INTRAMUSCULAR | Status: DC | PRN

## 2018-10-18 MED ORDER — BUPIVACAINE HCL 0.5 % IJ SOLN
INTRAMUSCULAR | Status: DC | PRN
  Administered 2018-10-18: 30 mL

## 2018-10-18 MED ORDER — DEXAMETHASONE SODIUM PHOSPHATE 10 MG/ML IJ SOLN
INTRAMUSCULAR | Status: DC | PRN
  Administered 2018-10-18: 10 mg via INTRAVENOUS

## 2018-10-18 MED ORDER — ALBUMIN HUMAN 5 % IV SOLN
INTRAVENOUS | Status: DC | PRN
  Administered 2018-10-18: 12:00:00 via INTRAVENOUS

## 2018-10-18 MED ORDER — ONDANSETRON HCL 4 MG/2ML IJ SOLN
INTRAMUSCULAR | Status: DC | PRN
  Administered 2018-10-18 (×2): 4 mg via INTRAVENOUS

## 2018-10-18 MED ORDER — SODIUM CHLORIDE 0.9 % IV SOLN: INTRAVENOUS | Status: DC | PRN

## 2018-10-18 MED ORDER — TIOTROPIUM BROMIDE MONOHYDRATE 2.5 MCG/ACT IN AERS: 2.0000 | INHALATION_SPRAY | Freq: Every day | RESPIRATORY_TRACT | Status: DC

## 2018-10-18 MED ORDER — PROPOFOL 10 MG/ML IV BOLUS
INTRAVENOUS | Status: DC | PRN
  Administered 2018-10-18: 120 mg via INTRAVENOUS

## 2018-10-18 MED ORDER — LIDOCAINE 2% (20 MG/ML) 5 ML SYRINGE
INTRAMUSCULAR | Status: DC | PRN
  Administered 2018-10-18: 40 mg via INTRAVENOUS

## 2018-10-18 MED ORDER — SUGAMMADEX SODIUM 200 MG/2ML IV SOLN
INTRAVENOUS | Status: DC | PRN
  Administered 2018-10-18: 200 mg via INTRAVENOUS

## 2018-10-18 MED ORDER — CEFAZOLIN SODIUM-DEXTROSE 2-4 GM/100ML-% IV SOLN
2.0000 g | INTRAVENOUS | Status: AC
  Administered 2018-10-18: 2 g via INTRAVENOUS

## 2018-10-18 MED ORDER — CEFAZOLIN SODIUM-DEXTROSE 2-4 GM/100ML-% IV SOLN
INTRAVENOUS | Status: AC
  Filled 2018-10-18: qty 100

## 2018-10-18 MED ORDER — SODIUM CHLORIDE 0.9 % IV SOLN
INTRAVENOUS | Status: DC
  Administered 2018-10-18: 21:00:00 via INTRAVENOUS

## 2018-10-18 MED ORDER — ONDANSETRON HCL 4 MG/2ML IJ SOLN: 4.0000 mg | Freq: Once | INTRAMUSCULAR | Status: DC | PRN

## 2018-10-18 MED ORDER — MEPERIDINE HCL 25 MG/ML IJ SOLN: 6.2500 mg | INTRAMUSCULAR | Status: DC | PRN

## 2018-10-18 MED ORDER — ROCURONIUM BROMIDE 10 MG/ML (PF) SYRINGE
PREFILLED_SYRINGE | INTRAVENOUS | Status: DC | PRN
  Administered 2018-10-18: 30 mg via INTRAVENOUS
  Administered 2018-10-18: 60 mg via INTRAVENOUS

## 2018-10-18 MED ORDER — SERTRALINE HCL 100 MG PO TABS
200.0000 mg | ORAL_TABLET | Freq: Every day | ORAL | Status: DC
  Administered 2018-10-19 – 2018-10-25 (×7): 200 mg via ORAL
  Filled 2018-10-18 (×8): qty 2

## 2018-10-18 MED ORDER — SENNOSIDES-DOCUSATE SODIUM 8.6-50 MG PO TABS
1.0000 | ORAL_TABLET | Freq: Every day | ORAL | Status: DC
  Administered 2018-10-18 – 2018-10-24 (×7): 1 via ORAL
  Filled 2018-10-18 (×7): qty 1

## 2018-10-18 MED ORDER — UMECLIDINIUM BROMIDE 62.5 MCG/INH IN AEPB
1.0000 | INHALATION_SPRAY | Freq: Every day | RESPIRATORY_TRACT | Status: DC
  Administered 2018-10-20 – 2018-10-25 (×6): 1 via RESPIRATORY_TRACT
  Filled 2018-10-18: qty 7

## 2018-10-18 MED ORDER — MIDAZOLAM HCL 2 MG/2ML IJ SOLN
INTRAMUSCULAR | Status: AC
  Filled 2018-10-18: qty 2

## 2018-10-18 MED ORDER — ONDANSETRON HCL 4 MG/2ML IJ SOLN
INTRAMUSCULAR | Status: AC
  Filled 2018-10-18: qty 4

## 2018-10-18 MED ORDER — GABAPENTIN 300 MG PO CAPS
300.0000 mg | ORAL_CAPSULE | Freq: Every day | ORAL | Status: DC
  Administered 2018-10-18 – 2018-10-24 (×7): 300 mg via ORAL
  Filled 2018-10-18 (×7): qty 1

## 2018-10-18 SURGICAL SUPPLY — 129 items
ADH SKN CLS APL DERMABOND .7 (GAUZE/BANDAGES/DRESSINGS) ×2
APL SWBSTK 6 STRL LF DISP (MISCELLANEOUS) ×2
APPLICATOR COTTON TIP 6 STRL (MISCELLANEOUS) ×1 IMPLANT
APPLICATOR COTTON TIP 6IN STRL (MISCELLANEOUS) ×4
APPLIER CLIP ROT 10 11.4 M/L (STAPLE)
APR CLP MED LRG 11.4X10 (STAPLE)
BAG SPEC RTRVL LRG 6X4 10 (ENDOMECHANICALS)
BIT DRILL 7/64X5 DISP (BIT) IMPLANT
BLADE CLIPPER SURG (BLADE) ×4 IMPLANT
CANISTER SUCT 3000ML PPV (MISCELLANEOUS) ×5 IMPLANT
CATH KIT ON-Q SILVERSOAK 5 (CATHETERS) IMPLANT
CATH KIT ON-Q SILVERSOAK 5IN (CATHETERS) IMPLANT
CATH ROBINSON RED A/P 22FR (CATHETERS) IMPLANT
CATH THORACIC 28FR (CATHETERS) ×3 IMPLANT
CATH THORACIC 28FR RT ANG (CATHETERS) IMPLANT
CATH THORACIC 36FR (CATHETERS) IMPLANT
CATH THORACIC 36FR RT ANG (CATHETERS) IMPLANT
CLIP APPLIE ROT 10 11.4 M/L (STAPLE) IMPLANT
CLIP VESOCCLUDE MED 24/CT (CLIP) ×3 IMPLANT
CLIP VESOCCLUDE MED 6/CT (CLIP) ×4 IMPLANT
CONN ST 1/4X3/8  BEN (MISCELLANEOUS) ×2
CONN ST 1/4X3/8 BEN (MISCELLANEOUS) ×1 IMPLANT
CONN Y 3/8X3/8X3/8  BEN (MISCELLANEOUS)
CONN Y 3/8X3/8X3/8 BEN (MISCELLANEOUS) IMPLANT
CONT SPEC 4OZ CLIKSEAL STRL BL (MISCELLANEOUS) ×10 IMPLANT
COVER SURGICAL LIGHT HANDLE (MISCELLANEOUS) IMPLANT
COVER WAND RF STERILE (DRAPES) ×1 IMPLANT
DERMABOND ADVANCED (GAUZE/BANDAGES/DRESSINGS) ×2
DERMABOND ADVANCED .7 DNX12 (GAUZE/BANDAGES/DRESSINGS) ×1 IMPLANT
DRAIN CHANNEL 28F RND 3/8 FF (WOUND CARE) ×3 IMPLANT
DRAIN CHANNEL 32F RND 10.7 FF (WOUND CARE) IMPLANT
DRAPE CV SPLIT W-CLR ANES SCRN (DRAPES) ×1 IMPLANT
DRAPE LAPAROSCOPIC ABDOMINAL (DRAPES) ×4 IMPLANT
DRAPE ORTHO SPLIT 77X108 STRL (DRAPES) ×4
DRAPE SURG ORHT 6 SPLT 77X108 (DRAPES) ×2 IMPLANT
DRAPE WARM FLUID 44X44 (DRAPES) ×4 IMPLANT
ELECT BLADE 6.5 EXT (BLADE) ×4 IMPLANT
ELECT REM PT RETURN 9FT ADLT (ELECTROSURGICAL) ×4
ELECTRODE REM PT RTRN 9FT ADLT (ELECTROSURGICAL) ×2 IMPLANT
GAUZE SPONGE 4X4 12PLY STRL (GAUZE/BANDAGES/DRESSINGS) ×4 IMPLANT
GAUZE SPONGE 4X4 12PLY STRL LF (GAUZE/BANDAGES/DRESSINGS) ×3 IMPLANT
GLOVE SURG SIGNA 7.5 PF LTX (GLOVE) ×3 IMPLANT
GLOVE TRIUMPH SURG SIZE 7.5 (KITS) ×6 IMPLANT
GOWN STRL REUS W/ TWL LRG LVL3 (GOWN DISPOSABLE) ×4 IMPLANT
GOWN STRL REUS W/ TWL XL LVL3 (GOWN DISPOSABLE) ×2 IMPLANT
GOWN STRL REUS W/TWL LRG LVL3 (GOWN DISPOSABLE) ×8
GOWN STRL REUS W/TWL XL LVL3 (GOWN DISPOSABLE) ×4
HEMOSTAT SURGICEL 2X14 (HEMOSTASIS) ×3 IMPLANT
INSERT FOGARTY 61MM (MISCELLANEOUS) IMPLANT
KIT BASIN OR (CUSTOM PROCEDURE TRAY) ×4 IMPLANT
KIT SUCTION CATH 14FR (SUCTIONS) ×4 IMPLANT
KIT TURNOVER KIT B (KITS) ×4 IMPLANT
NDL HYPO 25GX1X1/2 BEV (NEEDLE) ×1 IMPLANT
NDL SPNL 18GX3.5 QUINCKE PK (NEEDLE) ×1 IMPLANT
NEEDLE HYPO 25GX1X1/2 BEV (NEEDLE) ×4 IMPLANT
NEEDLE SPNL 18GX3.5 QUINCKE PK (NEEDLE) ×4 IMPLANT
NS IRRIG 1000ML POUR BTL (IV SOLUTION) ×12 IMPLANT
PACK CHEST (CUSTOM PROCEDURE TRAY) ×4 IMPLANT
PAD ARMBOARD 7.5X6 YLW CONV (MISCELLANEOUS) ×8 IMPLANT
POUCH ENDO CATCH II 15MM (MISCELLANEOUS) ×3 IMPLANT
POUCH SPECIMEN RETRIEVAL 10MM (ENDOMECHANICALS) IMPLANT
RELOAD STAPLE 35X2.5 WHT THIN (STAPLE) IMPLANT
RELOAD STAPLE 60 3.8 GOLD REG (STAPLE) IMPLANT
RELOAD STAPLE 60 4.1 GRN THCK (STAPLE) IMPLANT
RELOAD STAPLER GOLD 60MM (STAPLE) ×20 IMPLANT
RELOAD STAPLER GREEN 60MM (STAPLE) ×20 IMPLANT
SCISSORS LAP 5X35 DISP (ENDOMECHANICALS) IMPLANT
SEALANT PROGEL (MISCELLANEOUS) ×3 IMPLANT
SEALANT SURG COSEAL 4ML (VASCULAR PRODUCTS) IMPLANT
SEALANT SURG COSEAL 8ML (VASCULAR PRODUCTS) IMPLANT
SHEARS HARMONIC HDI 20CM (ELECTROSURGICAL) IMPLANT
SOL ANTI FOG 6CC (MISCELLANEOUS) ×2 IMPLANT
SOLUTION ANTI FOG 6CC (MISCELLANEOUS) ×2
SPECIMEN JAR LG PLASTIC EMPTY (MISCELLANEOUS) IMPLANT
SPECIMEN JAR MEDIUM (MISCELLANEOUS) ×1 IMPLANT
SPONGE INTESTINAL PEANUT (DISPOSABLE) ×6 IMPLANT
SPONGE TONSIL 1.5 RFD TRIPLE (SPONGE) ×3 IMPLANT
SPONGE TONSIL TAPE 1 RFD (DISPOSABLE) ×4 IMPLANT
STAPLE ECHEON FLEX 60 POW ENDO (STAPLE) ×3 IMPLANT
STAPLE RELOAD 2.5MM WHITE (STAPLE) ×28 IMPLANT
STAPLER RELOAD GOLD 60MM (STAPLE) ×40
STAPLER RELOAD GREEN 60MM (STAPLE) ×40
STAPLER VASCULAR ECHELON 35 (CUTTER) ×3 IMPLANT
STAPLER VISISTAT 35W (STAPLE) IMPLANT
SUT PDS AB 3-0 SH 27 (SUTURE) IMPLANT
SUT PROLENE 4 0 RB 1 (SUTURE) ×4
SUT PROLENE 4 0 SH DA (SUTURE) ×9 IMPLANT
SUT PROLENE 4-0 RB1 .5 CRCL 36 (SUTURE) ×1 IMPLANT
SUT SILK  1 MH (SUTURE) ×4
SUT SILK 1 MH (SUTURE) ×4 IMPLANT
SUT SILK 1 TIES 10X30 (SUTURE) ×4 IMPLANT
SUT SILK 2 0 SH (SUTURE) IMPLANT
SUT SILK 2 0SH CR/8 30 (SUTURE) ×3 IMPLANT
SUT SILK 3 0 SH 30 (SUTURE) IMPLANT
SUT SILK 3 0SH CR/8 30 (SUTURE) ×4 IMPLANT
SUT VIC AB 1 CTX 36 (SUTURE) ×4
SUT VIC AB 1 CTX36XBRD ANBCTR (SUTURE) ×2 IMPLANT
SUT VIC AB 2-0 CT1 27 (SUTURE)
SUT VIC AB 2-0 CT1 TAPERPNT 27 (SUTURE) IMPLANT
SUT VIC AB 2-0 CTX 36 (SUTURE) ×4 IMPLANT
SUT VIC AB 3-0 MH 27 (SUTURE) IMPLANT
SUT VIC AB 3-0 SH 18 (SUTURE) IMPLANT
SUT VIC AB 3-0 SH 27 (SUTURE)
SUT VIC AB 3-0 SH 27X BRD (SUTURE) IMPLANT
SUT VIC AB 3-0 X1 27 (SUTURE) ×8 IMPLANT
SUT VICRYL 0 UR6 27IN ABS (SUTURE) ×4 IMPLANT
SUT VICRYL 2 TP 1 (SUTURE) ×3 IMPLANT
SWAB COLLECTION DEVICE MRSA (MISCELLANEOUS) IMPLANT
SWAB CULTURE ESWAB REG 1ML (MISCELLANEOUS) IMPLANT
SWAB CULTURE LIQ STUART DBL (MISCELLANEOUS) ×3 IMPLANT
SWAB CULTURE LIQUID MINI MALE (MISCELLANEOUS) ×3 IMPLANT
SYR 10ML LL (SYRINGE) ×4 IMPLANT
SYR 20ML LL LF (SYRINGE) IMPLANT
SYR 30ML LL (SYRINGE) ×4 IMPLANT
SYR CONTROL 10ML LL (SYRINGE) IMPLANT
SYSTEM SAHARA CHEST DRAIN ATS (WOUND CARE) ×1 IMPLANT
TAPE CLOTH 4X10 WHT NS (GAUZE/BANDAGES/DRESSINGS) ×4 IMPLANT
TAPE CLOTH SURG 4X10 WHT LF (GAUZE/BANDAGES/DRESSINGS) ×3 IMPLANT
TIP APPLICATOR SPRAY EXTEND 16 (VASCULAR PRODUCTS) IMPLANT
TOWEL GREEN STERILE (TOWEL DISPOSABLE) ×4 IMPLANT
TOWEL GREEN STERILE FF (TOWEL DISPOSABLE) ×4 IMPLANT
TRAP SPECIMEN MUCOUS 40CC (MISCELLANEOUS) IMPLANT
TRAY FOLEY MTR SLVR 16FR STAT (SET/KITS/TRAYS/PACK) ×4 IMPLANT
TRAY FOLEY SLVR 16FR LF STAT (SET/KITS/TRAYS/PACK) ×1 IMPLANT
TROCAR XCEL BLADELESS 5X75MML (TROCAR) ×4 IMPLANT
TUBE CONNECTING 20'X1/4 (TUBING) ×2
TUBE CONNECTING 20X1/4 (TUBING) ×4 IMPLANT
TUNNELER SHEATH ON-Q 11GX8 DSP (PAIN MANAGEMENT) IMPLANT
WATER STERILE IRR 1000ML POUR (IV SOLUTION) ×7 IMPLANT

## 2018-10-18 NOTE — Transfer of Care (Signed)
Immediate Anesthesia Transfer of Care Note  Patient: William Kirk  Procedure(s) Performed: VIDEO ASSISTED THORACOSCOPY (VATS)/ right lower LOBECTOMY (Right Chest)  Patient Location: PACU  Anesthesia Type:General  Level of Consciousness: oriented, drowsy and patient cooperative  Airway & Oxygen Therapy: Patient Spontanous Breathing and Patient connected to face mask oxygen  Post-op Assessment: Report given to RN and Post -op Vital signs reviewed and stable  Post vital signs: Reviewed and stable  Last Vitals:  Vitals Value Taken Time  BP 131/52 10/18/18 1422  Temp    Pulse 57 10/18/18 1426  Resp 12 10/18/18 1426  SpO2 100 % 10/18/18 1426  Vitals shown include unvalidated device data.  Last Pain:  Vitals:   10/18/18 0631  TempSrc: Oral  PainSc: 0-No pain         Complications: No apparent anesthesia complications

## 2018-10-18 NOTE — Progress Notes (Signed)
EVENING ROUNDS NOTE :     [address]       ,[place] [address]             [phone]                 Day of Surgery Procedure(s) (LRB): VIDEO ASSISTED THORACOSCOPY (VATS)/ right lower LOBECTOMY (Right)   Total Length of Stay:  LOS: 0 days  Events:  Doing well.  Resting comfortably, air leak present    BP (!) 121/57   Pulse 64   Temp (!) 97 F (36.1 C)   Resp 13   Ht 5\' 10"  (1.778 m)   Wt 67.7 kg   SpO2 96%   BMI 21.41 kg/m           No intake/output data recorded.   CBC Latest Ref Rng & Units 10/16/2018 07/28/2018 07/27/2018  WBC 4.0 - 10.5 K/uL 11.3(H) 13.4(H) -  Hemoglobin 13.0 - 17.0 g/dL 12.0(L) 8.5(L) 8.6(L)  Hematocrit 39.0 - 52.0 % 40.4 28.2(L) 28.4(L)  Platelets 150 - 400 K/uL 386 337 -    BMP Latest Ref Rng & Units 10/16/2018 07/27/2018 07/26/2018  Glucose 70 - 99 mg/dL 88 94 83  BUN 8 - 23 mg/dL 18 12 12   Creatinine 0.61 - 1.24 mg/dL 1.59(H) 1.12 0.99  Sodium 135 - 145 mmol/L 140 140 141  Potassium 3.5 - 5.1 mmol/L 3.9 3.5 4.1  Chloride 98 - 111 mmol/L 111 104 104  CO2 22 - 32 mmol/L 21(L) 26 30  Calcium 8.9 - 10.3 mg/dL 8.8(L) 9.1 9.0    ABG    Component Value Date/Time   PHART 7.359 10/16/2018 1123   PCO2ART 41.4 10/16/2018 1123   PO2ART 91.2 10/16/2018 1123   HCO3 22.8 10/16/2018 1123   TCO2 23 07/07/2018 1622   ACIDBASEDEF 1.9 10/16/2018 1123   O2SAT 96.8 10/16/2018 [place], MD 10/18/2018 6:50 PM

## 2018-10-18 NOTE — Anesthesia Postprocedure Evaluation (Signed)
Anesthesia Post Note  Patient: William Kirk  Procedure(s) Performed: VIDEO ASSISTED THORACOSCOPY (VATS)/ right lower LOBECTOMY (Right Chest)     Patient location during evaluation: PACU Anesthesia Type: General Level of consciousness: lethargic and responds to stimulation Pain management: pain level controlled Vital Signs Assessment: post-procedure vital signs reviewed and stable Respiratory status: spontaneous breathing, nonlabored ventilation, respiratory function stable and patient connected to nasal cannula oxygen Cardiovascular status: blood pressure returned to baseline and stable Postop Assessment: no apparent nausea or vomiting Anesthetic complications: no    Last Vitals:  Vitals:   10/18/18 1500 10/18/18 1505  BP:  (!) 116/49  Pulse: 62 62  Resp: 13 13  Temp:    SpO2: 99% 99%    Last Pain:  Vitals:   10/18/18 1450  TempSrc:   PainSc: Asleep                 Chanice Brenton A.

## 2018-10-18 NOTE — Brief Op Note (Addendum)
10/18/2018  1:57 PM  PATIENT:  Gaynelle Arabian Sienkiewicz  78 y.o. male  PRE-OPERATIVE DIAGNOSIS:  Right lower lobe bronchiectasis  POST-OPERATIVE DIAGNOSIS:  Right lower lobe bronchiectasis  PROCEDURE:   RIGHT VIDEO ASSISTED THORACOSCOPY (VATS),   LYSIS OF ADHESIONS THORACOSCOPIC RIGHT LOWER LOBECTOMY, and  INTERCOSTAL NERVE BLOCKS  SURGEON:  Surgeon(s) and Role:    Melrose Nakayama, MD - Primary  PHYSICIAN ASSISTANT: Lars Pinks PA-C  ANESTHESIA:   general  EBL:  500 mL   BLOOD ADMINISTERED:none  DRAINS: 28 French chest tube and 28 Blake drain placed in the right pleural space   LOCAL MEDICATIONS USED:  Exparel  SPECIMEN:  Source of Specimen:  RUL  DISPOSITION OF SPECIMEN:  Culture and pathology  COUNTS:  YES  DICTATION: .Dragon Dictation  PLAN OF CARE: Admit to inpatient   PATIENT DISPOSITION:  PACU - hemodynamically stable.   Delay start of Pharmacological VTE agent (>24hrs) due to surgical blood loss or risk of bleeding: no

## 2018-10-18 NOTE — Interval H&P Note (Signed)
History and Physical Interval Note:  10/18/2018 8:08 AM  William Kirk  has presented today for surgery, with the diagnosis of Right lower lobe bronchiectasis.  The various methods of treatment have been discussed with the patient and family. After consideration of risks, benefits and other options for treatment, the patient has consented to  Procedure(s): VIDEO ASSISTED THORACOSCOPY (VATS)/ right lower LOBECTOMY (Right) possible, THORACOTOMY MAJOR (Right) as a surgical intervention.  The patient's history has been reviewed, patient examined, no change in status, stable for surgery.  I have reviewed the patient's chart and labs.  Questions were answered to the patient's satisfaction.     Melrose Nakayama

## 2018-10-18 NOTE — Progress Notes (Signed)
Personal Home O2 tank taken to wife, [name], in waiting area as requested per patient.

## 2018-10-18 NOTE — Anesthesia Procedure Notes (Signed)
Arterial Line Insertion Start/End9/11/2018 7:10 AM, 10/18/2018 7:15 AM Performed by: Laretta Alstrom, CRNA, CRNA  Patient location: Pre-op. Preanesthetic checklist: patient identified, IV checked, site marked, risks and benefits discussed, surgical consent, monitors and equipment checked, pre-op evaluation, timeout performed and anesthesia consent Lidocaine 1% used for infiltration Left, radial was placed Catheter size: 20 G Maximum sterile barriers used   Attempts: 1 Procedure performed without using ultrasound guided technique. Following insertion, dressing applied and Biopatch. Post procedure assessment: unchanged  Patient tolerated the procedure well with no immediate complications.

## 2018-10-19 ENCOUNTER — Encounter (HOSPITAL_COMMUNITY): Payer: Self-pay | Admitting: Thoracic Surgery (Cardiothoracic Vascular Surgery)

## 2018-10-19 ENCOUNTER — Inpatient Hospital Stay (HOSPITAL_COMMUNITY): Payer: No Typology Code available for payment source

## 2018-10-19 LAB — POCT I-STAT 7, (LYTES, BLD GAS, ICA,H+H)
Acid-base deficit: 3 mmol/L — ABNORMAL HIGH (ref 0.0–2.0)
Bicarbonate: 24.1 mmol/L (ref 20.0–28.0)
Calcium, Ion: 1.24 mmol/L (ref 1.15–1.40)
HCT: 28 % — ABNORMAL LOW (ref 39.0–52.0)
Hemoglobin: 9.5 g/dL — ABNORMAL LOW (ref 13.0–17.0)
O2 Saturation: 95 %
Patient temperature: 36.4
Potassium: 4.5 mmol/L (ref 3.5–5.1)
Sodium: 143 mmol/L (ref 135–145)
TCO2: 26 mmol/L (ref 22–32)
pCO2 arterial: 49.9 mmHg — ABNORMAL HIGH (ref 32.0–48.0)
pH, Arterial: 7.29 — ABNORMAL LOW (ref 7.350–7.450)
pO2, Arterial: 83 mmHg (ref 83.0–108.0)

## 2018-10-19 LAB — GLUCOSE, CAPILLARY
Glucose-Capillary: 79 mg/dL (ref 70–99)
Glucose-Capillary: 91 mg/dL (ref 70–99)
Glucose-Capillary: 96 mg/dL (ref 70–99)
Glucose-Capillary: 98 mg/dL (ref 70–99)

## 2018-10-19 LAB — CBC
HCT: 29.7 % — ABNORMAL LOW (ref 39.0–52.0)
Hemoglobin: 8.7 g/dL — ABNORMAL LOW (ref 13.0–17.0)
MCH: 26 pg (ref 26.0–34.0)
MCHC: 29.3 g/dL — ABNORMAL LOW (ref 30.0–36.0)
MCV: 88.9 fL (ref 80.0–100.0)
Platelets: 361 10*3/uL (ref 150–400)
RBC: 3.34 MIL/uL — ABNORMAL LOW (ref 4.22–5.81)
RDW: 15.9 % — ABNORMAL HIGH (ref 11.5–15.5)
WBC: 15.6 10*3/uL — ABNORMAL HIGH (ref 4.0–10.5)
nRBC: 0 % (ref 0.0–0.2)

## 2018-10-19 LAB — BASIC METABOLIC PANEL
Anion gap: 7 (ref 5–15)
BUN: 20 mg/dL (ref 8–23)
CO2: 23 mmol/L (ref 22–32)
Calcium: 8.1 mg/dL — ABNORMAL LOW (ref 8.9–10.3)
Chloride: 111 mmol/L (ref 98–111)
Creatinine, Ser: 1.49 mg/dL — ABNORMAL HIGH (ref 0.61–1.24)
GFR calc Af Amer: 52 mL/min — ABNORMAL LOW (ref >60)
GFR calc non Af Amer: 45 mL/min — ABNORMAL LOW (ref >60)
Glucose, Bld: 83 mg/dL (ref 70–99)
Potassium: 4.5 mmol/L (ref 3.5–5.1)
Sodium: 141 mmol/L (ref 135–145)

## 2018-10-19 MED ORDER — PNEUMOCOCCAL VAC POLYVALENT 25 MCG/0.5ML IJ INJ
0.5000 mL | INJECTION | INTRAMUSCULAR | Status: AC
  Administered 2018-10-22: 0.5 mL via INTRAMUSCULAR
  Filled 2018-10-19: qty 0.5

## 2018-10-19 MED ORDER — INFLUENZA VAC A&B SA ADJ QUAD 0.5 ML IM PRSY
0.5000 mL | PREFILLED_SYRINGE | INTRAMUSCULAR | Status: AC
  Administered 2018-10-22: 0.5 mL via INTRAMUSCULAR
  Filled 2018-10-19 (×2): qty 0.5

## 2018-10-19 MED ORDER — ENSURE ENLIVE PO LIQD
237.0000 mL | Freq: Two times a day (BID) | ORAL | Status: DC
  Administered 2018-10-20: 10:00:00 237 mL via ORAL

## 2018-10-19 NOTE — Progress Notes (Signed)
Patient ID: William Kirk, male   DOB: 04/14/1940, 78 y.o.   MRN: [id number] Evening Rounds:  Hemodynamically stable in sinus rhythm.  sats 94% on   Ambulated well  CT output 400 cc since this am. Air leak persists.

## 2018-10-19 NOTE — Discharge Instructions (Signed)

## 2018-10-19 NOTE — Progress Notes (Addendum)
[facility] DAILY ICU PROGRESS NOTE                   [address]            [place] [address]          [phone]   1 Day Post-Op Procedure(s) (LRB): VIDEO ASSISTED THORACOSCOPY (VATS)/ right lower LOBECTOMY (Right)  Total Length of Stay:  LOS: 1 day   Subjective: Patient sleeping and awakened. He is not using PCA much  Objective: Vital signs in last 24 hours: Temp:  [96.8 F (36 C)-97.8 F (36.6 C)] 97.6 F (36.4 C) (09/11 0406) Pulse Rate:  [56-66] 64 (09/10 1750) Cardiac Rhythm: Normal sinus rhythm (09/11 0400) Resp:  [11-25] 20 (09/11 0600) BP: (101-135)/(49-61) 128/53 (09/11 0600) SpO2:  [91 %-100 %] 91 % (09/11 0600) Arterial Line BP: (136-177)/(32-51) 177/43 (09/11 0600) Weight:  [67.8 kg] 67.8 kg (09/11 0600)  Filed Weights   10/18/18 0631 10/19/18 0600  Weight: 67.7 kg 67.8 kg    Weight change: 0.123 kg      Intake/Output from previous day: 09/10 0701 - 09/11 0700 In: 3591.6 [I.V.:2941.6; IV Piggyback:450] Out: 2735 [Urine:1265; Blood:500; Chest Tube:970]  Intake/Output this shift: No intake/output data recorded.  Current Meds: Scheduled Meds: . acetaminophen  1,000 mg Oral Q6H   Or  . acetaminophen (TYLENOL) oral liquid 160 mg/5 mL  1,000 mg Oral Q6H  . atorvastatin  20 mg Oral QHS  . bisacodyl  10 mg Oral Daily  . Chlorhexidine Gluconate Cloth  6 each Topical Daily  . enoxaparin (LOVENOX) injection  40 mg Subcutaneous Q24H  . fentaNYL   Intravenous Q4H  . gabapentin  300 mg Oral QHS  . insulin aspart  0-24 Units Subcutaneous Q6H  . lidocaine  1 patch Transdermal Q24H  . mouth rinse  15 mL Mouth Rinse BID  . mometasone-formoterol  2 puff Inhalation BID  . multivitamin with minerals  1 tablet Oral Daily  . senna-docusate  1 tablet Oral QHS  . sertraline  200 mg Oral Daily  . traZODone  50 mg Oral QHS  . umeclidinium bromide  1 puff Inhalation Daily   Continuous Infusions: . sodium chloride    . sodium chloride 100 mL/hr at  10/19/18 0600   PRN Meds:.Place/Maintain arterial line **AND** sodium chloride, albuterol, diphenhydrAMINE **OR** diphenhydrAMINE, naloxone **AND** sodium chloride flush, ondansetron (ZOFRAN) IV, oxyCODONE, traMADol  General appearance: alert, cooperative and no distress Neurologic: intact Heart: RRR Lungs: Clear to auscultation on the left and coarse on the right Abdomen: Soft, non tender, sporadic bowel sounds Extremities: SCDs in place Wound: Clean and dry Chest tubes: to suction, +1 air leak with cough  Lab Results: CBC: Recent Labs    10/16/18 1123 10/19/18 0427 10/19/18 0428  WBC 11.3* 15.6*  --   HGB 12.0* 8.7* 9.5*  HCT 40.4 29.7* 28.0*  PLT 386 361  --    BMET:  Recent Labs    10/16/18 1123 10/19/18 0427 10/19/18 0428  NA 140 141 143  K 3.9 4.5 4.5  CL 111 111  --   CO2 21* 23  --   GLUCOSE 88 83  --   BUN 18 20  --   CREATININE 1.59* 1.49*  --   CALCIUM 8.8* 8.1*  --     CMET: Lab Results  Component Value Date   WBC 15.6 (H) 10/19/2018   HGB 9.5 (L) 10/19/2018   HCT 28.0 (L) 10/19/2018   PLT  361 10/19/2018   GLUCOSE 83 10/19/2018   TRIG 76 07/11/2018   ALT 21 10/16/2018   AST 25 10/16/2018   NA 143 10/19/2018   K 4.5 10/19/2018   CL 111 10/19/2018   CREATININE 1.49 (H) 10/19/2018   BUN 20 10/19/2018   CO2 23 10/19/2018   INR 1.1 10/16/2018      PT/INR:  Recent Labs    10/16/18 1123  LABPROT 14.1  INR 1.1   Radiology: Dg Chest Port 1 View  Result Date: 10/18/2018 CLINICAL DATA:  Pneumothorax EXAM: PORTABLE CHEST 1 VIEW COMPARISON:  10/16/2018 FINDINGS: Interval placement of right-sided chest tubes with dense atelectasis or consolidation of the right lower lung. No significant pneumothorax appreciated. Irregular scarring or atelectasis of the left lung base. The heart and mediastinum are unremarkable. IMPRESSION: Interval placement of right-sided chest tubes with dense atelectasis or consolidation of the right lower lung. No significant  pneumothorax appreciated. Electronically Signed   By: Eddie Candle M.D.   On: 10/18/2018 15:56     Assessment/Plan: S/P Procedure(s) (LRB): VIDEO ASSISTED THORACOSCOPY (VATS)/ right lower LOBECTOMY (Right) 1. CV-SR in the 60's. Will not restart Losartan yet secondary to elevated creatinine. 2. Pulmonary-ABG this am PH 7.29 pCO2 49.9 pO2 83 and HCO3 24.1. On 2 liters of oxygen via [place]. Chest tubes with 970 cc of output since surgery. Chest tubes are to suction and there is a +1 air leak with cough. CXR this am appears similar to post op. Encourage incentive spirometer. 3. ABL anemia-H and H this am decreased to 8.7 and 29.7. Monitor 4. CBGs 155/98/91. Stop accu checks and SS PRN 5. Creatinine decreased from 1.59 to 1.49. May leave foley until am 6. Remove a line, decrease IVF, advance diet 7. Patient uses Lidocaine patch PRN for low back pain. He states does not need at this time. 8. Transfer to [facility] or 4E if bed available   Nani Skillern [facility] 10/19/2018 7:05 AM   Patient seen and examined, agree with above + air leak- keep CT to suction Follow renal function, elevated at 1.59 prior to admission SCD + enoxaparin + ambulation for DVT prevention  Remo Lipps C. Roxan Hockey, MD [facility] [phone]

## 2018-10-19 NOTE — Discharge Summary (Addendum)
Physician Discharge Summary       [address] [place]       [place] [address]             [phone]    Patient ID: William Kirk MRN: [id number] DOB/AGE: January 26, 1941 78 y.o.  Admit date: 10/18/2018 Discharge date: 10/25/2018  Admission Diagnoses: 1.  Right lower lobe bronchiectasis (HCC)  Discharge Diagnoses:  1. Status post right lower lobectomy, intercostal nerve block  2. ABL anemia 3. Post op atrial fibrillation 4. History of COPD (chronic obstructive pulmonary disease) (HCC) 5. History of PVD (HCC) 6. History of depression 7. History of pneumonia 8. History of bleeding stomach ulcer 9. History of arthritis   Procedure (s):  Right video-assisted thoracoscopy, lysis of adhesions, thoracoscopic right lower lobectomy, intercostal nerve blocks at levels 3 through 10 by Dr. Dorris Fetch on 10/18/2018.  Pathology and Culture:  Lung, resection (segmental or lobe), Right lower lobe - BRONCHIECTASIS WITH ASSOCIATED FIBROSIS - EMPHYSEMA - BENIGN LYMPH NODE - NO EVIDENCE OF MALIGNANCY  FEW KLEBSIELLA PNEUMONIAE   History of Presenting Illness: William Kirk is a 78 year old gentleman with a history of tobacco abuse, COPD, bronchiectasis, recurrent pneumonia, recurrent hemoptysis, peripheral arterial disease, gastroesophageal reflux, and depression.  He has had multiple pneumonias over the past few years and has chronic bronchiectasis and atelectasis in the right lower lobe.  He was admitted in May with massive hemoptysis.  That resolved initially but he presented again in June with recurrent massive hemoptysis.  He underwent bronchial artery embolization.  He is chronically on Augmentin.  Cultures grew Candida albicans.  He saw Dr. Allyne Gee who did not feel that needed antifungal treatment.  He has been afebrile.  He is not had any recurrent hemoptysis.  He is on 2 L nasal cannula chronically at home.  His appetite has improved.  He has gained about 5 pounds since  his last visit.  Dr. Dorris Fetch and the patient discussed the possibility of right VATS and possible right thoracotomy for right lower lobectomy.  This does not eliminate the possibility of him getting pneumonia.  But it does address the near certainty of recurrent pneumonias and that nonfunctional right lower lobe.  Dr. Dorris Fetch discussed the general nature of the operation with Mr. and William Kirk.  They understand the general nature of the procedure including the incisions to be used, the use of drainage tubes postoperatively, the need for general anesthesia, the expected hospital stay, and the overall recovery.  Dr. Dorris Fetch informed him of the indications, risk, benefits, and alternatives. Patient agreed to proceed with surgery. He underwent right VATS, lysis of adhesions, thoracoscopic assisted RLL, and intercostal nerve block on 10/18/2018.  Brief Hospital Course:  The patient remained afebrile and hemodynamically stable. A line and foley were removed early in the post operative course. His creatinine was mildly elevated on admission and post op, but this did resolve. His last creatinine was down to 1.19. Culture showed Klebsiella Pneumonia so he ws put on oral Cipro. Chest tube output gradually decreased and there was a small air leak. Air leak did resolve. Daily chest x rays were obtained and remained stable. All chest tubes were removed by 09/16.  He developed rapid Atrial Fibrillation early on 09/15.  He was treated with IV Amiodarone.  He converted to NSR and was transitioned  to oral Amiodarone. He was also started on Toprol XL 12.5 mg daily. He was in a fib less than 24 hours and does not require anticoagulation. Patient  is ambulating with 2 liters of oxygen via [place]. Of note, he used oxygen at night prior to admission. Patient is tolerating a diet and has had a bowel movement. Wounds are clean and dry. Final chest  ray showed increased atelectasis on the right and no pneumothorax.  Patient is felt surgically stable for discharge today.   Latest Vital Signs: Blood pressure (!) 129/51, pulse 85, temperature 98.5 F (36.9 C), temperature source Oral, resp. rate (!) 25, height 5\' 10"  (1.778 m), weight 66.6 kg, SpO2 92 %.  Physical Exam: Cardiovascular: RRR Pulmonary: Clear to auscultation on left and slightly diminished right basilar breath sounds Abdomen: Soft, non tender, bowel sounds present. Extremities: No  lower extremity edema. Wounds: Clean and dry.  No erythema or signs of infection. Minor dried serous drainage on chest tube wound dressings  Discharge Condition: Stable and discharged to home.  Recent laboratory studies:  Lab Results  Component Value Date   WBC 13.7 (H) 10/23/2018   HGB 8.0 (L) 10/23/2018   HCT 25.1 (L) 10/23/2018   MCV 84.5 10/23/2018   PLT 351 10/23/2018   Lab Results  Component Value Date   NA 139 10/21/2018   K 3.8 10/21/2018   CL 108 10/21/2018   CO2 25 10/21/2018   CREATININE 1.19 10/21/2018   GLUCOSE 125 (H) 10/21/2018     Diagnostic Studies: Dg Chest 2 View  Result Date: 10/25/2018 CLINICAL DATA:  Pneumothorax. EXAM: CHEST - 2 VIEW COMPARISON:  10/24/2018 FINDINGS: Moderate opacification over the right base likely moderate size effusion with associated atelectasis unchanged to slightly worse. Previously noted tiny right apical pneumothorax not well visualized on the current study. Left lung is clear. Cardiomediastinal silhouette and remainder of the exam is unchanged. IMPRESSION: Opacification over the right mid to lower lung likely moderate effusion with associated atelectasis unchanged to slightly worse. No definite right upper pneumothorax visualized. Electronically Signed   By: Elberta Fortis M.D.   On: 10/25/2018 10:02   Dg Chest 2 View  Result Date: 10/16/2018 CLINICAL DATA:  Preop evaluation for upcoming VATS EXAM: CHEST - 2 VIEW COMPARISON:  09/18/2018 FINDINGS: Cardiac shadows within normal limits. The lungs are  hyperinflated. Chronic changes in the right base are again seen and stable consistent with the given clinical history. Degenerative changes of the thoracic spine are noted. IMPRESSION: Chronic changes without acute abnormality. Electronically Signed   By: Alcide Clever M.D.   On: 10/16/2018 15:52   Dg Chest 1v Repeat Same Day  Result Date: 10/24/2018 CLINICAL DATA:  78 year old male status post chest tube removal. EXAM: CHEST - 1 VIEW SAME DAY COMPARISON:  Chest radiograph dated 10/24/2018 FINDINGS: There has been interval removal of the right-sided chest tube. No detectable pneumothorax. Right lung base surgical sutures and opacities similar to prior radiograph likely combination of atelectasis and probable small pleural effusion. Pneumonia is not excluded. Clinical correlation is recommended. The left lung remains clear. The cardiac silhouette is within normal limits. Right lateral chest wall soft tissue emphysema again noted. IMPRESSION: Interval removal of the right-sided chest tube. No detectable pneumothorax. Electronically Signed   By: [name] M.D.   On: 10/24/2018 11:25   Dg Chest Port 1 View  Result Date: 10/24/2018 CLINICAL DATA:  Pneumothorax. EXAM: PORTABLE CHEST 1 VIEW COMPARISON:  Radiographs of October 23, 2018. FINDINGS: Stable cardiomediastinal silhouette. Atherosclerosis of thoracic aorta is noted. Left lung is clear. Minimal right apical pneumothorax is again noted and stable. Stable subcutaneous emphysema is seen over right lateral  chest wall. Right-sided chest tube is unchanged in position. Stable right basilar atelectasis is noted with probable associated pleural effusion. Bony thorax is unremarkable. IMPRESSION: Stable position of right-sided chest tube with minimal right apical pneumothorax. Stable right basilar atelectasis and probable effusion is noted. Stable subcutaneous emphysema seen over right lateral chest wall. Electronically Signed   By: Lupita Raider M.D.   On:  10/24/2018 10:21   Dg Chest Port 1 View  Result Date: 10/23/2018 CLINICAL DATA:  Prominent ectomy.  Chest tube. EXAM: PORTABLE CHEST 1 VIEW COMPARISON:  10/22/2018. FINDINGS: Right chest tube in stable position. Tiny right apical and right base pneumothorax cannot be excluded on today's exam. Postsurgical changes right lung. Heart size normal. Right chest wall subcutaneous emphysema again noted. IMPRESSION: 1. Right chest tube in stable position. Tiny right ankle right base pneumothorax noted on today's exam. Right chest wall subcutaneous emphysema again noted. 2.  Postsurgical changes right lung again noted. Critical Value/emergent results were called by telephone at the time of interpretation on 10/23/2018 at 7:03 am to nurse Shanda Bumps, who verbally acknowledged these results. Electronically Signed   By: Maisie Fus  Register   On: 10/23/2018 07:05   Dg Chest Port 1 View  Result Date: 10/22/2018 CLINICAL DATA:  Postop thoracotomy. EXAM: PORTABLE CHEST 1 VIEW COMPARISON:  Radiographs earlier today and 10/21/2018. CT 07/26/2018. FINDINGS: 1253 hours. The more inferior of the right chest tubes appears to have been removed in the interval. There is increased soft tissue emphysema within the right lateral chest wall, but no definite pneumothorax is seen. There is stable volume loss in the right hemithorax with parenchymal opacity inferiorly. The left lung is clear. The heart size and mediastinal contours are stable. IMPRESSION: Interval removal of 1 of the right-sided chest tubes without evidence of complicating pneumothorax. Increased right lateral chest wall soft tissue emphysema. Electronically Signed   By: Carey Bullocks M.D.   On: 10/22/2018 13:11   Dg Chest Port 1 View  Result Date: 10/22/2018 CLINICAL DATA:  78 year old male with a history of shortness of breath. Prior VATS and right lower lobectomy EXAM: PORTABLE CHEST 1 VIEW COMPARISON:  October 21, 2018, October 20, 2018 FINDINGS: Cardiomediastinal  silhouette unchanged in size and contour. Unchanged thoracostomy tube. Surgical changes in the right hilar/infrahilar region. No visualized pneumothorax. Coarsened interstitial markings bilaterally. Opacity at the right lung base obscuring the right hemidiaphragm is unchanged. No new left-sided airspace disease. Degenerative changes of the shoulders. IMPRESSION: Similar appearance of postoperative chest x-ray with right thoracostomy tube at the lung base and no visualized pneumothorax. Opacity at the right lung base potentially combination of postoperative changes, small pleural fluid, and/or atelectasis. Electronically Signed   By: Gilmer Mor D.O.   On: 10/22/2018 07:52   Dg Chest Port 1 View  Result Date: 10/21/2018 CLINICAL DATA:  78 year old male with history of pneumothorax. Status post right lower lobectomy on 10/18/2018. EXAM: PORTABLE CHEST 1 VIEW COMPARISON:  Chest x-ray 10/20/2018. FINDINGS: Left lung is well expanded and clear. Status post right lower lobectomy. Small basilar pneumothorax on the right side. Right-sided chest tubes are in position with tips terminating in the right mid and lower hemithorax, similar to yesterday's examination. Opacities in the right base likely reflect a combination of pleural fluid and areas of atelectasis in the remaining right upper and middle lobes. No evidence of pulmonary edema. Heart size is normal. Slight shift of cardiomediastinal structures to the right. Aortic atherosclerosis. Small amount of subcutaneous emphysema in the right chest  wall, similar to the prior study. IMPRESSION: 1. Stable radiographic appearance the chest, as above. Electronically Signed   By: Trudie Reed M.D.   On: 10/21/2018 07:16   Dg Chest Port 1 View  Result Date: 10/20/2018 CLINICAL DATA:  Evaluate chest tube EXAM: PORTABLE CHEST 1 VIEW COMPARISON:  Chest radiograph 10/20/2018 FINDINGS: Monitoring leads overlie the patient. Right chest tubes stable in position. Stable  cardiac and mediastinal contours. Unchanged consolidation within the right lower hemithorax. Similar-appearing right basilar pneumothorax. Unchanged right chest wall emphysema. Similar interstitial opacities left lung. IMPRESSION: Stable right chest tubes with small right basilar pneumothorax. Similar consolidation right lower hemithorax. Electronically Signed   By: Annia Belt M.D.   On: 10/20/2018 13:48   Dg Chest Port 1 View  Result Date: 10/20/2018 CLINICAL DATA:  Pneumothorax follow-up EXAM: PORTABLE CHEST 1 VIEW COMPARISON:  Yesterday FINDINGS: Stable volume loss and opacity at the right base. 2 right-sided chest tubes in similar position with small basal pneumothorax and increased chest wall emphysema. Normal heart size. Interstitial coarsening on the left. IMPRESSION: 1. Stable chest tube positioning with small right basal pneumothorax. 2. Stable right base atelectasis. Electronically Signed   By: Marnee Spring M.D.   On: 10/20/2018 08:16   Dg Chest Port 1 View  Result Date: 10/19/2018 CLINICAL DATA:  Chest tube present s/p VATS EXAM: PORTABLE CHEST 1 VIEW COMPARISON:  Radiograph 10/18/2018 FINDINGS: Two RIGHT chest tubes in place. No pneumothorax. Volume loss in the RIGHT hemithorax. Atelectasis at the RIGHT lung base. Mild interstitial thickening lungs bilateral period IMPRESSION: 1. Two RIGHT chest tube in place post procedure. Volume loss the RIGHT hemithorax. No pneumothorax. 2. No interval change Electronically Signed   By: Genevive Bi M.D.   On: 10/19/2018 08:48   Dg Chest Port 1 View  Result Date: 10/18/2018 CLINICAL DATA:  Pneumothorax EXAM: PORTABLE CHEST 1 VIEW COMPARISON:  10/16/2018 FINDINGS: Interval placement of right-sided chest tubes with dense atelectasis or consolidation of the right lower lung. No significant pneumothorax appreciated. Irregular scarring or atelectasis of the left lung base. The heart and mediastinum are unremarkable. IMPRESSION: Interval placement of  right-sided chest tubes with dense atelectasis or consolidation of the right lower lung. No significant pneumothorax appreciated. Electronically Signed   By: Lauralyn Primes M.D.   On: 10/18/2018 15:56         Discharge Medications: Allergies as of 10/25/2018   No Known Allergies     Medication List    STOP taking these medications   HYDROcodone-acetaminophen 10-325 MG tablet Commonly known as: NORCO   losartan 50 MG tablet Commonly known as: COZAAR     TAKE these medications   acetaminophen 500 MG tablet Commonly known as: TYLENOL Take 500 mg by mouth 3 (three) times daily. What changed: Another medication with the same name was removed. Continue taking this medication, and follow the directions you see here.   albuterol 108 (90 Base) MCG/ACT inhaler Commonly known as: VENTOLIN HFA Inhale 2 puffs into the lungs every 4 (four) hours as needed for wheezing or shortness of breath.   albuterol (2.5 MG/3ML) 0.083% nebulizer solution Commonly known as: PROVENTIL Take 3 mLs (2.5 mg total) by nebulization 2 (two) times a day for 30 days.   amiodarone 200 MG tablet Commonly known as: PACERONE Take 1 tablet (200 mg total) by mouth 2 (two) times daily. For 5 days then take 200 mg daily thereafter   atorvastatin 20 MG tablet Commonly known as: LIPITOR Take 20 mg by  mouth at bedtime.   budesonide-formoterol 160-4.5 MCG/ACT inhaler Commonly known as: SYMBICORT Inhale 2 puffs into the lungs 2 (two) times daily.   diclofenac sodium 1 % Gel Commonly known as: VOLTAREN Apply topically 4 (four) times daily as needed.   gabapentin 300 MG capsule Commonly known as: NEURONTIN Take 300 mg by mouth at bedtime.   lidocaine 5 % Commonly known as: LIDODERM Place 1 patch onto the skin daily. Remove & Discard patch within 12 hours or as directed by MD   metoprolol succinate 25 MG 24 hr tablet Commonly known as: TOPROL-XL Take 0.5 tablets (12.5 mg total) by mouth daily. Start taking  on: October 26, 2018   multivitamin with minerals Tabs tablet Take 1 tablet by mouth daily.   OXYGEN Inhale 2-3 L into the lungs See admin instructions. Inhale 3 liters of oxygen during the day and 2 liters at bedtime   sertraline 100 MG tablet Commonly known as: ZOLOFT Take 200 mg by mouth daily.   Spiriva Respimat 2.5 MCG/ACT Aers Generic drug: Tiotropium Bromide Monohydrate Inhale 2 puffs into the lungs daily.   testosterone cypionate 100 MG/ML injection Commonly known as: DEPOTESTOTERONE CYPIONATE Inject 200 mg into the muscle every 28 (twenty-eight) days.   traMADol 50 MG tablet Commonly known as: ULTRAM Take 1 tablet (50 mg total) by mouth every 6 (six) hours as needed (mild pain).   traZODone 50 MG tablet Commonly known as: DESYREL Take 50 mg by mouth at bedtime.   vitamin B-12 1000 MCG tablet Commonly known as: CYANOCOBALAMIN Take 1,000 mcg by mouth daily.       Follow Up Appointments: Follow-up Information    Loreli Slot, MD. Go on 11/13/2018.   Specialty: Cardiothoracic Surgery Why: PA/LAT CXR to be taken (at [facility] which is in the same building as Dr. Sunday Corn office) on 10/06 at 4:00 pm;Appointment is at 4:30 pm Contact information: [address] [employer] [address] [place] [address] [phone]        Nurse. Go on 11/07/2018.   Why: Appointment is with nurse only for chest tube suture removal only. Appointment time is at 11:00 am Contact information: [address] [employer] [address] [place] [address]          Signed: Lelon Huh [facility] 10/25/2018, 10:12 AM

## 2018-10-20 ENCOUNTER — Inpatient Hospital Stay (HOSPITAL_COMMUNITY): Payer: No Typology Code available for payment source

## 2018-10-20 LAB — CBC
HCT: 28.1 % — ABNORMAL LOW (ref 39.0–52.0)
Hemoglobin: 8.5 g/dL — ABNORMAL LOW (ref 13.0–17.0)
MCH: 26.2 pg (ref 26.0–34.0)
MCHC: 30.2 g/dL (ref 30.0–36.0)
MCV: 86.5 fL (ref 80.0–100.0)
Platelets: 305 10*3/uL (ref 150–400)
RBC: 3.25 MIL/uL — ABNORMAL LOW (ref 4.22–5.81)
RDW: 16.2 % — ABNORMAL HIGH (ref 11.5–15.5)
WBC: 15.1 10*3/uL — ABNORMAL HIGH (ref 4.0–10.5)
nRBC: 0 % (ref 0.0–0.2)

## 2018-10-20 LAB — COMPREHENSIVE METABOLIC PANEL
ALT: 8 U/L (ref 0–44)
AST: 23 U/L (ref 15–41)
Albumin: 2.5 g/dL — ABNORMAL LOW (ref 3.5–5.0)
Alkaline Phosphatase: 75 U/L (ref 38–126)
Anion gap: 9 (ref 5–15)
BUN: 20 mg/dL (ref 8–23)
CO2: 23 mmol/L (ref 22–32)
Calcium: 8.2 mg/dL — ABNORMAL LOW (ref 8.9–10.3)
Chloride: 108 mmol/L (ref 98–111)
Creatinine, Ser: 1.46 mg/dL — ABNORMAL HIGH (ref 0.61–1.24)
GFR calc Af Amer: 53 mL/min — ABNORMAL LOW (ref >60)
GFR calc non Af Amer: 46 mL/min — ABNORMAL LOW (ref >60)
Glucose, Bld: 95 mg/dL (ref 70–99)
Potassium: 4.1 mmol/L (ref 3.5–5.1)
Sodium: 140 mmol/L (ref 135–145)
Total Bilirubin: 0.4 mg/dL (ref 0.3–1.2)
Total Protein: 5.7 g/dL — ABNORMAL LOW (ref 6.5–8.1)

## 2018-10-20 LAB — GLUCOSE, CAPILLARY: Glucose-Capillary: 116 mg/dL — ABNORMAL HIGH (ref 70–99)

## 2018-10-20 MED ORDER — ENSURE ENLIVE PO LIQD
237.0000 mL | Freq: Three times a day (TID) | ORAL | Status: DC
  Administered 2018-10-20 – 2018-10-25 (×10): 237 mL via ORAL

## 2018-10-20 NOTE — Progress Notes (Signed)
While getting the patient up to take a walk, he had an acute change in respiratory status. Large titling was noted in his chest tube and he became SOB. A small amount of Subq air was noted in between his chest tubes when the dressing was removed. [facility] was placed back on suction, Dr. Cyndia Bent was made aware and a CXR was ordered. Will continue to monitor.

## 2018-10-20 NOTE — Plan of Care (Signed)
  Problem: Clinical Measurements: Goal: Ability to maintain clinical measurements within normal limits will improve Outcome: Progressing   Problem: Clinical Measurements: Goal: Cardiovascular complication will be avoided Outcome: Progressing   Problem: Clinical Measurements: Goal: Respiratory complications will improve Outcome: Progressing   Problem: Clinical Measurements: Goal: Will remain free from infection Outcome: Progressing

## 2018-10-20 NOTE — Progress Notes (Addendum)
Initial Nutrition Assessment RD working remotely.  DOCUMENTATION CODES:   Not applicable  INTERVENTION:    Ensure Enlive po TID, each supplement provides 350 kcal and 20 grams of protein   Multivitamin with mineral daily  NUTRITION DIAGNOSIS:   Inadequate oral intake related to decreased appetite as evidenced by meal completion < 25%.  GOAL:   Patient will meet greater than or equal to 90% of their needs  MONITOR:   PO intake, Supplement acceptance, Labs, Skin, Weight trends  REASON FOR ASSESSMENT:   Malnutrition Screening Tool    ASSESSMENT:   78 yo male admitted with R lower lobe bronchiectasis, S/P VATS on 9/10. PMH includes PVD, COPD, HTN, former smoker, recurrent PNA.   Currently on 2 L nasal cannula.   Labs reviewed. Creatinine 1.46 (H)  Medications reviewed and include MVI, Senokot.  Intake since admission has been poor with 10% meal completion documented yesterday at lunch time.  Patient has a history of poor appetite and poor intake with weight loss. Current weight is 12% below ideal weight. Per MD H&P, patient had gained 5 lbs on admission since last visit. Per review of weight encounters, patient has had 2% weight loss within the past week, which is significant. BMI is below desired BMI for his age. Patient is at increased nutrition risk given recent weight loss with ongoing poor intake.   NUTRITION - FOCUSED PHYSICAL EXAM:  unable to complete  Diet Order:   Diet Order            Diet heart healthy/carb modified Room service appropriate? Yes; Fluid consistency: Thin  Diet effective now              EDUCATION NEEDS:   Not appropriate for education at this time  Skin:  Skin Assessment: Reviewed RN Assessment(closed chest incision)  Last BM:  9/12  Height:   Ht Readings from Last 1 Encounters:  10/18/18 5\' 10"  (1.778 m)    Weight:   Wt Readings from Last 1 Encounters:  10/20/18 66.6 kg    Ideal Body Weight:  75.5 kg  BMI:  Body  mass index is 21.07 kg/m.  Estimated Nutritional Needs:   Kcal:  2000-2200  Protein:  100-120 gm  Fluid:  >/= 2 L    Molli Barrows, RD, LDN, [place] Pager [phone] After Hours Pager [phone]

## 2018-10-20 NOTE — Progress Notes (Addendum)
TCTS DAILY ICU PROGRESS NOTE                   [address]            [place] [address]          [phone]   2 Days Post-Op Procedure(s) (LRB): VIDEO ASSISTED THORACOSCOPY (VATS)/ right lower LOBECTOMY (Right)  Total Length of Stay:  LOS: 2 days   Subjective: Patient had a bowel movement. He just got moved from chair to bed  Objective: Vital signs in last 24 hours: Temp:  [97.5 F (36.4 C)-98.8 F (37.1 C)] 97.7 F (36.5 C) (09/12 0823) Pulse Rate:  [75] 75 (09/11 1929) Cardiac Rhythm: Normal sinus rhythm (09/12 0800) Resp:  [14-29] 24 (09/12 1000) BP: (89-142)/(48-72) 140/50 (09/12 1000) SpO2:  [89 %-98 %] 98 % (09/12 1000) FiO2 (%):  [92 %-96 %] 96 % (09/11 1929) Weight:  [66.6 kg] 66.6 kg (09/12 0600)  Filed Weights   10/18/18 0631 10/19/18 0600 10/20/18 0600  Weight: 67.7 kg 67.8 kg 66.6 kg    Weight change: -1.2 kg      Intake/Output from previous day: 09/11 0701 - 09/12 0700 In: 635.1 [P.O.:360; I.V.:275.1] Out: 1750 [Urine:880; Chest Tube:870]  Intake/Output this shift: Total I/O In: 30 [I.V.:30] Out: 100 [Chest Tube:100]  Current Meds: Scheduled Meds: . acetaminophen  1,000 mg Oral Q6H   Or  . acetaminophen (TYLENOL) oral liquid 160 mg/5 mL  1,000 mg Oral Q6H  . atorvastatin  20 mg Oral QHS  . bisacodyl  10 mg Oral Daily  . Chlorhexidine Gluconate Cloth  6 each Topical Daily  . enoxaparin (LOVENOX) injection  40 mg Subcutaneous Q24H  . feeding supplement (ENSURE ENLIVE)  237 mL Oral BID BM  . fentaNYL   Intravenous Q4H  . gabapentin  300 mg Oral QHS  . influenza vaccine adjuvanted  0.5 mL Intramuscular Tomorrow-1000  . mouth rinse  15 mL Mouth Rinse BID  . mometasone-formoterol  2 puff Inhalation BID  . multivitamin with minerals  1 tablet Oral Daily  . pneumococcal 23 valent vaccine  0.5 mL Intramuscular Tomorrow-1000  . senna-docusate  1 tablet Oral QHS  . sertraline  200 mg Oral Daily  . traZODone  50 mg Oral QHS  .  umeclidinium bromide  1 puff Inhalation Daily   Continuous Infusions: . sodium chloride    . sodium chloride 10 mL/hr at 10/20/18 0900   PRN Meds:.Place/Maintain arterial line **AND** sodium chloride, albuterol, diphenhydrAMINE **OR** diphenhydrAMINE, naloxone **AND** sodium chloride flush, ondansetron (ZOFRAN) IV, oxyCODONE, traMADol  General appearance: alert, cooperative and no distress Neurologic: intact Heart: RRR Lungs: Clear to auscultation on the left and coarse on the right Abdomen: Soft, non tender, sporadic bowel sounds Extremities: SCDs in place Wound: Clean and dry Chest tubes: to suction, +1 air leak   Lab Results: CBC: Recent Labs    10/19/18 0427 10/19/18 0428 10/20/18 0301  WBC 15.6*  --  15.1*  HGB 8.7* 9.5* 8.5*  HCT 29.7* 28.0* 28.1*  PLT 361  --  305   BMET:  Recent Labs    10/19/18 0427 10/19/18 0428 10/20/18 0301  NA 141 143 140  K 4.5 4.5 4.1  CL 111  --  108  CO2 23  --  23  GLUCOSE 83  --  95  BUN 20  --  20  CREATININE 1.49*  --  1.46*  CALCIUM 8.1*  --  8.2*  CMET: Lab Results  Component Value Date   WBC 15.1 (H) 10/20/2018   HGB 8.5 (L) 10/20/2018   HCT 28.1 (L) 10/20/2018   PLT 305 10/20/2018   GLUCOSE 95 10/20/2018   TRIG 76 07/11/2018   ALT 8 10/20/2018   AST 23 10/20/2018   NA 140 10/20/2018   K 4.1 10/20/2018   CL 108 10/20/2018   CREATININE 1.46 (H) 10/20/2018   BUN 20 10/20/2018   CO2 23 10/20/2018   INR 1.1 10/16/2018      PT/INR:  No results for input(s): LABPROT, INR in the last 72 hours. Radiology: Dg Chest Port 1 View  Result Date: 10/20/2018 CLINICAL DATA:  Pneumothorax follow-up EXAM: PORTABLE CHEST 1 VIEW COMPARISON:  Yesterday FINDINGS: Stable volume loss and opacity at the right base. 2 right-sided chest tubes in similar position with small basal pneumothorax and increased chest wall emphysema. Normal heart size. Interstitial coarsening on the left. IMPRESSION: 1. Stable chest tube positioning with  small right basal pneumothorax. 2. Stable right base atelectasis. Electronically Signed   By: Monte Fantasia M.D.   On: 10/20/2018 08:16     Assessment/Plan: S/P Procedure(s) (LRB): VIDEO ASSISTED THORACOSCOPY (VATS)/ right lower LOBECTOMY (Right)   1. CV-SR in the 60's. Will not restart Losartan yet secondary to elevated creatinine. 2. Pulmonary- On 2 liters of oxygen via [place]. Chest tubes with 870 cc of output last 24 hours. Chest tubes are to suction and there is a +1 air leak.. CXR this am shows small right basilar pneumothorax and lateral chest wall subcutaneous emphysema. Encourage incentive spirometer. 3. ABL anemia-H and H this am decreased to 8.5 and 28.1 5. Creatinine slightly decreased from 1.49 to 1.46. (creatinine 1.59 upon admission).    Donielle Liston [facility] 10/20/2018 10:23 AM     Chart reviewed, patient examined, agree with above. CXR stable. I don't see any air leak with cough at this time. Will put to water seal and check CXR in am.

## 2018-10-21 ENCOUNTER — Inpatient Hospital Stay (HOSPITAL_COMMUNITY): Payer: No Typology Code available for payment source

## 2018-10-21 LAB — BASIC METABOLIC PANEL
Anion gap: 6 (ref 5–15)
BUN: 20 mg/dL (ref 8–23)
CO2: 25 mmol/L (ref 22–32)
Calcium: 8.3 mg/dL — ABNORMAL LOW (ref 8.9–10.3)
Chloride: 108 mmol/L (ref 98–111)
Creatinine, Ser: 1.19 mg/dL (ref 0.61–1.24)
GFR calc Af Amer: >60 mL/min (ref >60)
GFR calc non Af Amer: 59 mL/min — ABNORMAL LOW (ref >60)
Glucose, Bld: 125 mg/dL — ABNORMAL HIGH (ref 70–99)
Potassium: 3.8 mmol/L (ref 3.5–5.1)
Sodium: 139 mmol/L (ref 135–145)

## 2018-10-21 LAB — ACID FAST SMEAR (AFB, MYCOBACTERIA): Acid Fast Smear: NEGATIVE

## 2018-10-21 MED ORDER — CHLORHEXIDINE GLUCONATE CLOTH 2 % EX PADS
6.0000 | MEDICATED_PAD | Freq: Every day | CUTANEOUS | Status: DC
  Administered 2018-10-24 – 2018-10-25 (×2): 6 via TOPICAL

## 2018-10-21 MED ORDER — CIPROFLOXACIN HCL 500 MG PO TABS
500.0000 mg | ORAL_TABLET | Freq: Two times a day (BID) | ORAL | Status: DC
  Administered 2018-10-21 – 2018-10-22 (×4): 500 mg via ORAL
  Filled 2018-10-21 (×4): qty 1

## 2018-10-21 MED ORDER — POTASSIUM CHLORIDE CRYS ER 20 MEQ PO TBCR
20.0000 meq | EXTENDED_RELEASE_TABLET | Freq: Once | ORAL | Status: AC
  Administered 2018-10-21: 20 meq via ORAL
  Filled 2018-10-21: qty 1

## 2018-10-21 NOTE — Progress Notes (Signed)
Found patient standing beside the bed with his oxygen tubing on the floor. Asked patient why he didn't call for help and he stated he didn't know how. Reminded patient to use his call light and assisted him back to bed. Put patients Fentanyl PCA button on top of this covers and patient stated he was not in pain. Confused and forgetful. Will monitor patient closely for his protection and safety. Bed alarm applied to patients safety.VSS

## 2018-10-21 NOTE — Progress Notes (Addendum)
      [address]       [place] [address]             [phone]       3 Days Post-Op Procedure(s) (LRB): VIDEO ASSISTED THORACOSCOPY (VATS)/ right lower LOBECTOMY (Right)  Subjective: Patient awake and alert this am. He has no specific complaint.  Objective: Vital signs in last 24 hours: Temp:  [97.7 F (36.5 C)-98.7 F (37.1 C)] 97.7 F (36.5 C) (09/13 0328) Pulse Rate:  [86-89] 86 (09/13 0400) Cardiac Rhythm: Normal sinus rhythm (09/13 0400) Resp:  [19-36] 22 (09/13 0400) BP: (104-157)/(50-74) 126/51 (09/13 0328) SpO2:  [93 %-98 %] 96 % (09/13 0400)      Intake/Output from previous day: 09/12 0701 - 09/13 0700 In: 853.9 [P.O.:674; I.V.:179.9] Out: 775 [Urine:220; Chest Tube:555]   Physical Exam:  Cardiovascular: RRR Pulmonary: Clear to auscultation on left and coarse on right Abdomen: Soft, non tender, bowel sounds present. Extremities: No  lower extremity edema. Wounds: Clean and dry.  No erythema or signs of infection. Chest Tubes:to suction, no air leak  Lab Results: CBC: Recent Labs    10/19/18 0427 10/19/18 0428 10/20/18 0301  WBC 15.6*  --  15.1*  HGB 8.7* 9.5* 8.5*  HCT 29.7* 28.0* 28.1*  PLT 361  --  305   BMET:  Recent Labs    10/19/18 0427 10/19/18 0428 10/20/18 0301  NA 141 143 140  K 4.5 4.5 4.1  CL 111  --  108  CO2 23  --  23  GLUCOSE 83  --  95  BUN 20  --  20  CREATININE 1.49*  --  1.46*  CALCIUM 8.1*  --  8.2*    PT/INR: No results for input(s): LABPROT, INR in the last 72 hours. ABG:  INR: Will add last result for INR, ABG once components are confirmed Will add last 4 CBG results once components are confirmed  Assessment/Plan: 1. CV-SR in the 80's. Will restart Losartan discharge. 2. Pulmonary- On 2 liters of oxygen via [place]. Chest tubes with 555 cc of output last 24 hours. Chest tubes are to suction and there is no air leak. CXR this am is stable. Hope to remove 1 chest tube soon. Encourage  incentive spirometer. Culture showed few Klebsiella Pneumonia so will start oral Cipro. 3. ABL anemia-H and H yesterday decreased to 8.5 and 28.1 5. Creatinine decreased from 1.46 to 1.19. (creatinine 1.59 upon admission).    Donielle M ZimmermanPA-C 10/21/2018,7:11 AM [id number]   Chart reviewed, patient examined, agree with above. He feels better. No pain with coughing today. CXR stable with right base atelectasis. CT output 555 cc for 24 hrs. I don't see any air leak today so will put to water seal.

## 2018-10-21 NOTE — Plan of Care (Signed)
  Problem: Clinical Measurements: Goal: Ability to maintain clinical measurements within normal limits will improve Outcome: Progressing   Problem: Clinical Measurements: Goal: Will remain free from infection Outcome: Progressing   Problem: Clinical Measurements: Goal: Respiratory complications will improve Outcome: Progressing   Problem: Nutrition: Goal: Adequate nutrition will be maintained Outcome: Progressing   Problem: Pain Managment: Goal: General experience of comfort will improve Outcome: Progressing   Problem: Safety: Goal: Ability to remain free from injury will improve Outcome: Progressing

## 2018-10-22 ENCOUNTER — Inpatient Hospital Stay (HOSPITAL_COMMUNITY): Payer: No Typology Code available for payment source

## 2018-10-22 DIAGNOSIS — J470 Bronchiectasis with acute lower respiratory infection: Secondary | ICD-10-CM | POA: Diagnosis not present

## 2018-10-22 LAB — CBC
HCT: 25.1 % — ABNORMAL LOW (ref 39.0–52.0)
Hemoglobin: 7.7 g/dL — ABNORMAL LOW (ref 13.0–17.0)
MCH: 26.1 pg (ref 26.0–34.0)
MCHC: 30.7 g/dL (ref 30.0–36.0)
MCV: 85.1 fL (ref 80.0–100.0)
Platelets: 342 10*3/uL (ref 150–400)
RBC: 2.95 MIL/uL — ABNORMAL LOW (ref 4.22–5.81)
RDW: 16.7 % — ABNORMAL HIGH (ref 11.5–15.5)
WBC: 16.7 10*3/uL — ABNORMAL HIGH (ref 4.0–10.5)
nRBC: 0 % (ref 0.0–0.2)

## 2018-10-22 NOTE — Progress Notes (Signed)
Paged Nicholes Rough PA as patient disconnected is chest tube at the y connection. CXR ordered, will monitor.

## 2018-10-22 NOTE — Progress Notes (Signed)
4 Days Post-Op Procedure(s) (LRB): VIDEO ASSISTED THORACOSCOPY (VATS)/ right lower LOBECTOMY (Right) Subjective: No complaints this morning  Objective: Vital signs in last 24 hours: Temp:  [97.6 F (36.4 C)-98.6 F (37 C)] 98.2 F (36.8 C) (09/14 0456) Pulse Rate:  [70-81] 78 (09/14 0500) Cardiac Rhythm: Normal sinus rhythm (09/14 0700) Resp:  [21-29] 21 (09/14 0500) BP: (110-138)/(53-65) 110/65 (09/14 0456) SpO2:  [95 %-100 %] 96 % (09/14 0500)  Hemodynamic parameters for last 24 hours:    Intake/Output from previous day: 09/13 0701 - 09/14 0700 In: 717 [P.O.:717] Out: 940 [Urine:650; Chest Tube:290] Intake/Output this shift: No intake/output data recorded.  General appearance: alert, cooperative and no distress Neurologic: intact Heart: regular rate and rhythm Lungs: diminished breath sounds right base Wound: clean and dry no air leak  Lab Results: Recent Labs    10/20/18 0301 10/22/18 0233  WBC 15.1* 16.7*  HGB 8.5* 7.7*  HCT 28.1* 25.1*  PLT 305 342   BMET:  Recent Labs    10/20/18 0301 10/21/18 0820  NA 140 139  K 4.1 3.8  CL 108 108  CO2 23 25  GLUCOSE 95 125*  BUN 20 20  CREATININE 1.46* 1.19  CALCIUM 8.2* 8.3*    PT/INR: No results for input(s): LABPROT, INR in the last 72 hours. ABG    Component Value Date/Time   PHART 7.290 (L) 10/19/2018 0428   HCO3 24.1 10/19/2018 0428   TCO2 26 10/19/2018 0428   ACIDBASEDEF 3.0 (H) 10/19/2018 0428   O2SAT 95.0 10/19/2018 0428      Assessment/Plan: S/P Procedure(s) (LRB): VIDEO ASSISTED THORACOSCOPY (VATS)/ right lower LOBECTOMY (Right) - POD # 4 right lower lobectomy  No air leak- dc anterior CT Drainage trending down- will keep Blake drain one more day CXR still shows some basilar atelectasis- will add flutter valve Continue ambulation WBC elevated- no fevers, monitor Renal function improved    LOS: 4 days    Melrose Nakayama 10/22/2018

## 2018-10-22 NOTE — Progress Notes (Signed)
      [address]       [place] [address]             [phone]      CXR reviewed and showed:    CLINICAL DATA:  Postop thoracotomy.  EXAM: PORTABLE CHEST 1 VIEW  COMPARISON:  Radiographs earlier today and 10/21/2018. CT 07/26/2018.  FINDINGS: 1253 hours. The more inferior of the right chest tubes appears to have been removed in the interval. There is increased soft tissue emphysema within the right lateral chest wall, but no definite pneumothorax is seen. There is stable volume loss in the right hemithorax with parenchymal opacity inferiorly. The left lung is clear. The heart size and mediastinal contours are stable.  IMPRESSION: Interval removal of 1 of the right-sided chest tubes without evidence of complicating pneumothorax. Increased right lateral chest wall soft tissue emphysema.   Electronically Signed   By: Richardean Sale M.D.   On: 10/22/2018 13:11    The patient is stable without shortness of breath. Spoke with Pecolia Ades, RN regarding plan of care. CXR in the morning to monitor  Right lateral chest wall soft tissue emphysema.    Nicholes Rough, PA-C

## 2018-10-22 NOTE — Plan of Care (Signed)
  Problem: Clinical Measurements: Goal: Ability to maintain clinical measurements within normal limits will improve Outcome: Progressing Goal: Will remain free from infection Outcome: Progressing Goal: Diagnostic test results will improve Outcome: Progressing Goal: Respiratory complications will improve Outcome: Progressing Goal: Cardiovascular complication will be avoided Outcome: Progressing   Problem: Activity: Goal: Risk for activity intolerance will decrease Outcome: Progressing   Problem: Coping: Goal: Level of anxiety will decrease Outcome: Progressing   Problem: Elimination: Goal: Will not experience complications related to bowel motility Outcome: Progressing   Problem: Pain Managment: Goal: General experience of comfort will improve Outcome: Progressing   Problem: Safety: Goal: Ability to remain free from injury will improve Outcome: Progressing   Problem: Skin Integrity: Goal: Risk for impaired skin integrity will decrease Outcome: Progressing   Problem: Activity: Goal: Risk for activity intolerance will decrease Outcome: Progressing   Problem: Cardiac: Goal: Will achieve and/or maintain hemodynamic stability Outcome: Progressing   Problem: Clinical Measurements: Goal: Postoperative complications will be avoided or minimized Outcome: Progressing   Problem: Respiratory: Goal: Respiratory status will improve Outcome: Progressing   Problem: Pain Management: Goal: Pain level will decrease Outcome: Progressing   Problem: Skin Integrity: Goal: Wound healing without signs and symptoms infection will improve Outcome: Progressing

## 2018-10-23 ENCOUNTER — Inpatient Hospital Stay (HOSPITAL_COMMUNITY): Payer: No Typology Code available for payment source

## 2018-10-23 LAB — CBC
HCT: 25.1 % — ABNORMAL LOW (ref 39.0–52.0)
Hemoglobin: 8 g/dL — ABNORMAL LOW (ref 13.0–17.0)
MCH: 26.9 pg (ref 26.0–34.0)
MCHC: 31.9 g/dL (ref 30.0–36.0)
MCV: 84.5 fL (ref 80.0–100.0)
Platelets: 351 10*3/uL (ref 150–400)
RBC: 2.97 MIL/uL — ABNORMAL LOW (ref 4.22–5.81)
RDW: 16.7 % — ABNORMAL HIGH (ref 11.5–15.5)
WBC: 13.7 10*3/uL — ABNORMAL HIGH (ref 4.0–10.5)
nRBC: 0 % (ref 0.0–0.2)

## 2018-10-23 MED ORDER — METOPROLOL TARTRATE 5 MG/5ML IV SOLN
5.0000 mg | Freq: Once | INTRAVENOUS | Status: AC
  Administered 2018-10-23: 5 mg via INTRAVENOUS

## 2018-10-23 MED ORDER — METOPROLOL TARTRATE 5 MG/5ML IV SOLN
INTRAVENOUS | Status: AC
  Filled 2018-10-23: qty 5

## 2018-10-23 MED ORDER — LEVALBUTEROL HCL 0.63 MG/3ML IN NEBU: 0.6300 mg | INHALATION_SOLUTION | Freq: Four times a day (QID) | RESPIRATORY_TRACT | Status: DC | PRN

## 2018-10-23 MED ORDER — AMIODARONE HCL IN DEXTROSE 360-4.14 MG/200ML-% IV SOLN
60.0000 mg/h | INTRAVENOUS | Status: AC
  Administered 2018-10-23 (×2): 60 mg/h via INTRAVENOUS
  Filled 2018-10-23: qty 200

## 2018-10-23 MED ORDER — AMIODARONE HCL IN DEXTROSE 360-4.14 MG/200ML-% IV SOLN
30.0000 mg/h | INTRAVENOUS | Status: AC
  Administered 2018-10-23: 30 mg/h via INTRAVENOUS
  Filled 2018-10-23 (×4): qty 200

## 2018-10-23 NOTE — TOC Initial Note (Signed)
Transition of Care [facility]) - Initial/Assessment Note    Patient Details  Name: William Kirk MRN: [id number] Date of Birth: [birth year]  Transition of Care [facility]) CM/SW Contact:    [name], RN Phone Number: 10/23/2018, 8:47 AM  Clinical Narrative:                 From home with spouse, s/p VATS R lobectomy, conts with chest tube to water seal, hope to dc chest tube tomorrow, now with afib with rvr on amio drip.  He goes to [facility], PCP there is Dr. Shelle Iron per patient.   Expected Discharge Plan: Home/Self Care Barriers to Discharge: No Barriers Identified   Patient Goals and CMS Choice Patient states their goals for this hospitalization and ongoing recovery are:: go home   Choice offered to / list presented to : NA  Expected Discharge Plan and Services Expected Discharge Plan: Home/Self Care In-house Referral: NA Discharge Planning Services: CM Consult Post Acute Care Choice: NA Living arrangements for the past 2 months: Single Family Home                 DME Arranged: (NA)         HH Arranged: NA          Prior Living Arrangements/Services Living arrangements for the past 2 months: Single Family Home Lives with:: Spouse Patient language and need for interpreter reviewed:: Yes        Need for Family Participation in Patient Care: No (Comment) Care giver support system in place?: No (comment)   Criminal Activity/Legal Involvement Pertinent to Current Situation/Hospitalization: No - Comment as needed  Activities of Daily Living Home Assistive Devices/Equipment: Oxygen, Nebulizer, Cane (specify quad or straight)(O2 2LNC at hs, 3LNC during day.  ) ADL Screening (condition at time of admission) Patient's cognitive ability adequate to safely complete daily activities?: Yes Is the patient deaf or have difficulty hearing?: No Does the patient have difficulty seeing, even when wearing glasses/contacts?: No Does the patient have difficulty  concentrating, remembering, or making decisions?: No Patient able to express need for assistance with ADLs?: Yes Does the patient have difficulty dressing or bathing?: No Independently performs ADLs?: Yes (appropriate for developmental age) Does the patient have difficulty walking or climbing stairs?: No Weakness of Legs: None Weakness of Arms/Hands: None  Permission Sought/Granted                  Emotional Assessment Appearance:: Appears stated age Attitude/Demeanor/Rapport: Engaged Affect (typically observed): Appropriate Orientation: : Oriented to Self, Oriented to Place, Oriented to  Time, Oriented to Situation Alcohol / Substance Use: Tobacco Use Psych Involvement: No (comment)  Admission diagnosis:  Right lower lobe bronchiectasis Patient Active Problem List   Diagnosis Date Noted  . 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 Hemoptysis 07/07/2018  . Acute renal failure (ARF) (HCC) 07/07/2018  . Acute respiratory failure (HCC) 07/07/2018  . Chronic obstructive pulmonary disease (HCC) 11/07/2016  . Chronic respiratory failure with hypoxia (HCC) 11/07/2016  . Pulmonary infiltrates 11/07/2016  . Swallowing dysfunction 09/26/2016  . Depression 09/21/2016  . Aspiration pneumonia (HCC) 09/21/2016  . HTN (hypertension) 09/21/2016  . HCAP (healthcare-associated pneumonia)   . COPD with acute exacerbation (HCC) 06/21/2016  . Acute respiratory failure with hypoxia (HCC) 06/21/2016  . Hypokalemia 06/21/2016  . Hyponatremia 06/21/2016  . Thrombocytosis (HCC) 06/21/2016  . PVD (peripheral vascular disease) (HCC) 06/21/2016  . Bronchiectasis (HCC) 06/21/2016  . Protein-calorie malnutrition, severe (HCC) 06/21/2016  .  Multifocal pneumonia 06/20/2016  . FUO (fever of unknown origin) 04/15/2015  . 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 Other tear of medial meniscus, current  injury, unspecified knee, initial encounter 12/03/2012   PCP:  Clinic, [facility] Pharmacy:   [facility] [place], [place] - [address] [phone] [facility] [address] [facility] [place] [address] Phone: [phone] Fax: [phone]  [id number] - [place], [place] - [address] [place] [place] [address] Phone: [phone] Fax: [phone]     Social Determinants of Health (SDOH) Interventions    Readmission Risk Interventions Readmission Risk Prevention Plan 10/23/2018  Transportation Screening Complete  PCP or Specialist Appt within 3-5 Days Complete  HRI or Home Care Consult Complete  Social Work Consult for Recovery Care Planning/Counseling Complete  Palliative Care Screening Not Applicable  Medication Review Oceanographer) Complete  Some recent data might be hidden

## 2018-10-23 NOTE — Progress Notes (Signed)
HR jumped from baseline NSR to Afib with RVR confirmed by 12 lead with rates 150-170s.  BP 109/72, patient is asymptomatic.  Dr. Orvan Seen called, orders for IV amiodarone per protocol obtained.  Will continue to monitor.

## 2018-10-23 NOTE — Op Note (Signed)
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  PROCEDURE:   Right video-assisted thoracoscopy, Lysis of adhesions, Thoracoscopic right lower lobectomy, Intercostal nerve blocks levels 3 through 10.  SURGEON:  Charlett Lango, MD  ASSISTANT:  Doree Fudge, PA-C  ANESTHESIA:  General.  FINDINGS:  Significant adhesions within the pleural space, enlarged but otherwise benign appearing lymph nodes, right lower lobe with significant consolidation.  CLINICAL NOTE:  William Kirk is a 79 year old gentleman with a history of tobacco abuse, COPD, recurrent pneumonia, bronchiectasis, and recurrent hemoptysis.  He has had multiple recurrent pneumonias over the past several years and had 2 episodes of massive  hemoptysis requiring hospitalization in May and June of this year.  He has been treated chronically with Augmentin.  He was referred for possible surgical resection.  He understood that there was no guarantee that this would totally eliminate his  hemoptysis, but would likely decrease the risk of recurrence and would likely decrease the risk of recurrent pneumonia.  The indications, risks, benefits, and alternatives were discussed in detail with the patient.  He understood and accepted the risks  and agreed to proceed.  OPERATIVE NOTE:  The patient was brought to the preoperative holding area on 10/18/2018.  The anesthesia service placed a central venous catheter and an arterial blood pressure monitoring line.  He was taken to the operating room, anesthetized and  intubated with a double lumen endotracheal tube.  Intravenous antibiotics were  administered.  A Foley catheter was placed.  Sequential compression devices were placed on the calves for DVT prophylaxis.  He was placed in a left lateral decubitus position  and the right chest was prepped and draped in the usual sterile fashion. Single lung ventilation of the left lung was initiated  A timeout was performed.  A solution containing 20 mL of liposomal bupivacaine, 30 mL of 0.5% bupivacaine and 50 mL of saline was prepared.  This was used for local anesthesia at the skin incisions as well as for the intercostal nerve blocks.   Single-lung ventilation of the left lung was initiated and tolerated well throughout the procedure.  An incision was made in the 7th interspace in the mid axillary line.  A port was inserted, but no free space was encountered.  Probing with a finger revealed adhesions of the lung to the chest wall in this area.  A 5 cm working incision was made in the  5th interspace anterolaterally.  No rib spreading was performed during the dissection, but the ribs did need to be spread briefly to remove the specimen from the chest.  The adhesions were taken down and the thoracoscope was advanced into the chest.  The  remainder of the lysis of adhesions was accomplished with video thoracoscopic assistance.  The inferior ligament was divided.  The pleural reflection was divided at the hilum posteriorly.  There were markedly enlarged lymph nodes posteriorly.  All lymph  nodes that were removed were sent for permanent pathology as separate specimens.  The nodes appeared enlarged, but reactive and did not appear malignant.  The pleural reflection was divided at the hilum anteriorly.  There was dense consolidation of the  lower lobe posteriorly.  The major fissure was divided beginning at the anterior aspect between the  middle and lower lobes.  As this dissection was carried laterally, the basilar segmental artery to the lower lobe was identified and the superior  segmental branch was  identified as well.  The fissure was completed with sequential firings of an Echelon stapler using gold cartridges.  The inferior pulmonary vein was cleared of surrounding tissue, encircled and divided with the endoscopic vascular  stapler.  Dissection then was begun between the lower lobe bronchus and the lower lobe pulmonary artery.  This was a slow and tedious dissection.  There was some bleeding encountered during the dissection, but ultimately the superior segmental and  basilar segmental branches were cleared of surrounding tissue, encircled and divided with the vascular stapler.  The basilar trunk was divided separately from the superior segmental branch.  The stapler then was placed across the origin of the left lower  lobe bronchus and closed.  A test inflation showed good aeration of the upper lobe, but the middle lobe was relatively slow to fill.  The stapler was removed and repositioned and this time, there was good aeration of both the upper and middle lobes.   The stapler was fired, transecting the bronchus.  The lower lobe was placed into an endoscopic retrieval bag and removed through the working incision.  The ribs did have to be spread to allow the specimen to be removed from the chest cavity.  Intercostal  nerve blocks then were performed from the 3rd to the 10th interspace and 10 mL of the bupivacaine solution was injected into each interspace in a subpleural plane.  The chest was copiously irrigated with warm saline.  Hemostasis was achieved.  A test  inflation to 30 cm of water revealed no leakage from the bronchial stump.  A 28-French chest tube was placed through the original port incision and secured with a #1 silk suture.  A 28-French Blake drain was placed through a 2nd port incision.  Both were  secured at the skin with #1 silk sutures.  The working incision was closed in standard fashion.  Dermabond was applied.  The patient was placed back in supine position.  The chest tubes  were placed to suction.  The patient was extubated in the operating  room and taken to the Postanesthetic Care Unit in good condition.  TN/NUANCE  D:10/23/2018 T:10/23/2018 JOB:[id number]

## 2018-10-23 NOTE — Progress Notes (Addendum)
      [address]       [place] [address]             [phone]       5 Days Post-Op Procedure(s) (LRB): VIDEO ASSISTED THORACOSCOPY (VATS)/ right lower LOBECTOMY (Right)  Subjective: Patient asking for ice water this am. His only complaint is he wants to go home.  Objective: Vital signs in last 24 hours: Temp:  [97.8 F (36.6 C)-99.6 F (37.6 C)] 98.7 F (37.1 C) (09/15 0305) Pulse Rate:  [78-162] 133 (09/15 0600) Cardiac Rhythm: Atrial fibrillation (09/15 0600) Resp:  [23-28] 26 (09/15 0600) BP: (102-136)/(51-72) 113/61 (09/15 0600) SpO2:  [92 %-99 %] 95 % (09/15 0600)      Intake/Output from previous day: 09/14 0701 - 09/15 0700 In: 1314.1 [P.O.:1200; I.V.:114.1] Out: 1010 [Urine:700; Chest Tube:310]   Physical Exam:  Cardiovascular: RRR Pulmonary: Clear to auscultation on left and slightly diminished right basilar breath sounds Abdomen: Soft, non tender, bowel sounds present. Extremities: No  lower extremity edema. Wounds: Clean and dry.  No erythema or signs of infection. Chest Tubes:to water seal, no air leak  Lab Results: CBC: Recent Labs    10/22/18 0233 10/23/18 0258  WBC 16.7* 13.7*  HGB 7.7* 8.0*  HCT 25.1* 25.1*  PLT 342 351   BMET:  Recent Labs    10/21/18 0820  NA 139  K 3.8  CL 108  CO2 25  GLUCOSE 125*  BUN 20  CREATININE 1.19  CALCIUM 8.3*    PT/INR: No results for input(s): LABPROT, INR in the last 72 hours. ABG:  INR: Will add last result for INR, ABG once components are confirmed Will add last 4 CBG results once components are confirmed  Assessment/Plan: 1. CV- He went into a fib with RVR earlier this am. On Amiodarone drip. Will give 5 mg of IV Lopressor this am 2. Pulmonary- On 2 liters of oxygen via [place]. Chest tubes with 310 cc of output last 24 hours. Chest tube is to water seal and there is no air leak. CXR this am shows trace right apical and ? Small right basilar pneumothorax and stable right  chest wall emphysema. Hope to remove chest tube. Encourage incentive spirometer.  3. ABL anemia-H and H yesterday decreased to 8 and 25.1 4. ID-on Cipro for few Klebsiella Pneumonia   Donielle M ZimmermanPA-C 10/23/2018,7:32 AM [phone]  Patient seen and examined, agree with above In rapid atrial fib this AM- amiodarone started early this AM, given 5 mg lopressor IV as HR still in 140-170 range- converted to SR after lopressor Klebsiella was in resected surgical specimen- no indication of active infection- dc Cipro Will keep ct one more day  Remo Lipps C. Roxan Hockey, MD [facility] [phone]

## 2018-10-23 NOTE — Plan of Care (Signed)
  Problem: Clinical Measurements: Goal: Ability to maintain clinical measurements within normal limits will improve Outcome: Progressing Goal: Will remain free from infection Outcome: Progressing Goal: Diagnostic test results will improve Outcome: Progressing Goal: Respiratory complications will improve Outcome: Progressing Goal: Cardiovascular complication will be avoided Outcome: Progressing   Problem: Activity: Goal: Risk for activity intolerance will decrease Outcome: Progressing   Problem: Coping: Goal: Level of anxiety will decrease Outcome: Progressing   Problem: Elimination: Goal: Will not experience complications related to bowel motility Outcome: Progressing   Problem: Pain Managment: Goal: General experience of comfort will improve Outcome: Progressing   Problem: Safety: Goal: Ability to remain free from injury will improve Outcome: Progressing   Problem: Skin Integrity: Goal: Risk for impaired skin integrity will decrease Outcome: Progressing   

## 2018-10-24 ENCOUNTER — Inpatient Hospital Stay (HOSPITAL_COMMUNITY): Payer: No Typology Code available for payment source

## 2018-10-24 MED ORDER — AMIODARONE HCL 200 MG PO TABS
200.0000 mg | ORAL_TABLET | Freq: Two times a day (BID) | ORAL | Status: DC
  Administered 2018-10-24 – 2018-10-25 (×3): 200 mg via ORAL
  Filled 2018-10-24 (×3): qty 1

## 2018-10-24 MED ORDER — METOPROLOL SUCCINATE ER 25 MG PO TB24
12.5000 mg | ORAL_TABLET | Freq: Every day | ORAL | Status: DC
  Administered 2018-10-24 – 2018-10-25 (×2): 12.5 mg via ORAL
  Filled 2018-10-24 (×2): qty 1

## 2018-10-24 NOTE — Plan of Care (Signed)
  Problem: Clinical Measurements: Goal: Ability to maintain clinical measurements within normal limits will improve Outcome: Progressing Goal: Will remain free from infection Outcome: Progressing Goal: Diagnostic test results will improve Outcome: Progressing Goal: Respiratory complications will improve Outcome: Progressing Goal: Cardiovascular complication will be avoided Outcome: Progressing   Problem: Activity: Goal: Risk for activity intolerance will decrease Outcome: Progressing   Problem: Coping: Goal: Level of anxiety will decrease Outcome: Progressing   Problem: Elimination: Goal: Will not experience complications related to bowel motility Outcome: Progressing   Problem: Pain Managment: Goal: General experience of comfort will improve Outcome: Progressing   Problem: Safety: Goal: Ability to remain free from injury will improve Outcome: Progressing   Problem: Skin Integrity: Goal: Risk for impaired skin integrity will decrease Outcome: Progressing   Problem: Activity: Goal: Risk for activity intolerance will decrease Outcome: Progressing   Problem: Cardiac: Goal: Will achieve and/or maintain hemodynamic stability Outcome: Progressing   Problem: Clinical Measurements: Goal: Postoperative complications will be avoided or minimized Outcome: Progressing   Problem: Respiratory: Goal: Respiratory status will improve Outcome: Progressing   Problem: Pain Management: Goal: Pain level will decrease Outcome: Progressing   Problem: Skin Integrity: Goal: Wound healing without signs and symptoms infection will improve Outcome: Progressing

## 2018-10-24 NOTE — Progress Notes (Signed)
Nutrition Follow-up  DOCUMENTATION CODES:   Not applicable  INTERVENTION:   - Recommend liberalizing diet to Regular to promote PO intake  - Continue Ensure Enlive po TID, each supplement provides 350 kcal and 20 grams of protein  - Continue MVI with minerals daily  NUTRITION DIAGNOSIS:   Inadequate oral intake related to decreased appetite as evidenced by meal completion < 25%.  Progressing, being addressed via oral nutrition supplements  GOAL:   Patient will meet greater than or equal to 90% of their needs  Progressing  MONITOR:   PO intake, Supplement acceptance, Labs, Skin, Weight trends  REASON FOR ASSESSMENT:   Malnutrition Screening Tool    ASSESSMENT:   78 yo male admitted with R lower lobe bronchiectasis, S/P VATS on 9/10. PMH includes PVD, COPD, HTN, former smoker, recurrent PNA.  Noted plan to d/c chest tube today.  Weight down 3 lbs since admission.  Spoke with pt via phone call to room. Pt reports appetite is improving slightly but that he really does not like the food. Pt states that PTA, he ate very well and had been gaining weight. Pt reports he is currently drinking 3-4 Ensures daily and that this is what he did PTA.  Pt reports he weighed "in the 170's" then lost down to 131 lbs. Pt is pleased because he has been able to gain weight back to current weight "with no problems."  Meal Completion: 10-100% x 3 meals  Medications reviewed and include: Dulcolax, Ensure Enlive TID, MVI with minerals, Senna  Labs reviewed: hemoglobin 8.0  CT: 50 ml x 24 hours  NUTRITION - FOCUSED PHYSICAL EXAM:  Unable to complete at this time.  Diet Order:   Diet Order            Diet heart healthy/carb modified Room service appropriate? Yes; Fluid consistency: Thin  Diet effective now              EDUCATION NEEDS:   Not appropriate for education at this time  Skin:  Skin Assessment: Reviewed RN Assessment (closed chest incision)  Last BM:   10/23/18  Height:   Ht Readings from Last 1 Encounters:  10/18/18 5\' 10"  (1.778 m)    Weight:   Wt Readings from Last 1 Encounters:  10/20/18 66.6 kg    Ideal Body Weight:  75.5 kg  BMI:  Body mass index is 21.07 kg/m.  Estimated Nutritional Needs:   Kcal:  2000-2200  Protein:  100-120 gm  Fluid:  >/= 2 L    Gaynell Face, MS, RD, LDN Inpatient Clinical Dietitian Pager: [phone] Weekend/After Hours: [phone]

## 2018-10-24 NOTE — Progress Notes (Addendum)
      [address]       [place] [address]             [phone]       6 Days Post-Op Procedure(s) (LRB): VIDEO ASSISTED THORACOSCOPY (VATS)/ right lower LOBECTOMY (Right)  Subjective: Patient without specific complaints this am.  Objective: Vital signs in last 24 hours: Temp:  [98.1 F (36.7 C)-98.7 F (37.1 C)] 98.6 F (37 C) (09/16 0336) Pulse Rate:  [64-165] 85 (09/16 0336) Cardiac Rhythm: Normal sinus rhythm (09/16 0430) Resp:  [20-29] 20 (09/16 0336) BP: (100-133)/(46-66) 120/56 (09/16 0336) SpO2:  [93 %-99 %] 99 % (09/16 0336)      Intake/Output from previous day: 09/15 0701 - 09/16 0700 In: 149 [I.V.:149] Out: 325 [Urine:275; Chest Tube:50]   Physical Exam:  Cardiovascular: RRR Pulmonary: Clear to auscultation on left and slightly diminished right basilar breath sounds Abdomen: Soft, non tender, bowel sounds present. Extremities: No  lower extremity edema. Wounds: Clean and dry.  No erythema or signs of infection. Chest Tubes:to water seal, no air leak  Lab Results: CBC: Recent Labs    10/22/18 0233 10/23/18 0258  WBC 16.7* 13.7*  HGB 7.7* 8.0*  HCT 25.1* 25.1*  PLT 342 351   BMET:  Recent Labs    10/21/18 0820  NA 139  K 3.8  CL 108  CO2 25  GLUCOSE 125*  BUN 20  CREATININE 1.19  CALCIUM 8.3*    PT/INR: No results for input(s): LABPROT, INR in the last 72 hours. ABG:  INR: Will add last result for INR, ABG once components are confirmed Will add last 4 CBG results once components are confirmed  Assessment/Plan: 1. CV- He went into a fib with RVR early yesterday morning. He converted to SR later in the morning and has remained in SR. On Amiodarone drip. Will change to oral Amiodarone. 2. Pulmonary- On 2 liters of oxygen via [place]. Chest tube with 50 cc of output last 24 hours. Chest tube is to water seal and there is no air leak. Await this am's CXR.  Likely  remove chest tube. Encourage incentive spirometer.  3.  ABL anemia-Last H and H  decreased to 8 and 25.1 4. If CXR stable in am, likely discharge in am  Sharalyn Ink [facility] 10/24/2018,7:09 AM [phone]  Patient seen and examined, agree with above CXR shows improved aeration at right base- continue IS, flutter Dc chest tube  Remo Lipps C. Roxan Hockey, MD [facility] [phone]

## 2018-10-24 NOTE — Plan of Care (Signed)
  Problem: Activity: Goal: Risk for activity intolerance will decrease Outcome: Progressing   Problem: Cardiac: Goal: Will achieve and/or maintain hemodynamic stability Outcome: Progressing   Problem: Clinical Measurements: Goal: Postoperative complications will be avoided or minimized Outcome: Progressing   Problem: Respiratory: Goal: Respiratory status will improve Outcome: Progressing   Problem: Pain Management: Goal: Pain level will decrease Outcome: Progressing   Problem: Skin Integrity: Goal: Wound healing without signs and symptoms infection will improve Outcome: Progressing   

## 2018-10-24 NOTE — TOC Progression Note (Addendum)
Transition of Care [facility]) - Progression Note    Patient Details  Name: William Kirk MRN: [id number] Date of Birth: 22-Jul-1940  Transition of Care [facility]) CM/SW Contact  Zenon Mayo, RN Phone Number: 10/24/2018, 2:01 PM  Clinical Narrative:    From home with spouse, VATS R lobectomy, afib with rvr on amio drip, chest tube dc'd, he goes to [facility], PCP is Dr. Oletta Darter CSW is Kellie Simmering [phone] pager , phone [phone] ext [phone].  He gets his medications from [facility] per patient.   Expected Discharge Plan: Home/Self Care Barriers to Discharge: No Barriers Identified  Expected Discharge Plan and Services Expected Discharge Plan: Home/Self Care In-house Referral: NA Discharge Planning Services: CM Consult Post Acute Care Choice: NA Living arrangements for the past 2 months: Single Family Home                 DME Arranged: (NA)         HH Arranged: NA           Social Determinants of Health (SDOH) Interventions    Readmission Risk Interventions Readmission Risk Prevention Plan 10/23/2018  Transportation Screening Complete  PCP or Specialist Appt within 3-5 Days Complete  HRI or [place] Complete  Social Work Consult for [place] Planning/Counseling Complete  Palliative Care Screening Not Applicable  Medication Review [employer]) Complete  Some recent data might be hidden

## 2018-10-25 ENCOUNTER — Inpatient Hospital Stay (HOSPITAL_COMMUNITY): Payer: No Typology Code available for payment source

## 2018-10-25 LAB — BLOOD GAS, ARTERIAL
Acid-base deficit: 1.9 mmol/L (ref 0.0–2.0)
Bicarbonate: 22.8 mmol/L (ref 20.0–28.0)
Drawn by: 265211
O2 Content: 2 L/min
O2 Saturation: 96.8 %
Patient temperature: 98.6
pCO2 arterial: 41.4 mmHg (ref 32.0–48.0)
pH, Arterial: 7.359 (ref 7.350–7.450)
pO2, Arterial: 91.2 mmHg (ref 83.0–108.0)

## 2018-10-25 LAB — AEROBIC/ANAEROBIC CULTURE W GRAM STAIN (SURGICAL/DEEP WOUND)

## 2018-10-25 MED ORDER — AMIODARONE HCL 200 MG PO TABS: 200.0000 mg | ORAL_TABLET | Freq: Two times a day (BID) | ORAL | 1 refills | Status: DC

## 2018-10-25 MED ORDER — METOPROLOL SUCCINATE ER 25 MG PO TB24: 12.5000 mg | ORAL_TABLET | Freq: Every day | ORAL | 1 refills | Status: DC

## 2018-10-25 MED ORDER — TRAMADOL HCL 50 MG PO TABS: 50.0000 mg | ORAL_TABLET | Freq: Four times a day (QID) | ORAL | 0 refills | Status: DC | PRN

## 2018-10-25 NOTE — Progress Notes (Addendum)
      [address]       [place] [address]             [phone]       7 Days Post-Op Procedure(s) (LRB): VIDEO ASSISTED THORACOSCOPY (VATS)/ right lower LOBECTOMY (Right)  Subjective: Patient eating breakfast and without specific complaints this am.  Objective: Vital signs in last 24 hours: Temp:  [97.6 F (36.4 C)-99.2 F (37.3 C)] 98.2 F (36.8 C) (09/17 0258) Pulse Rate:  [86-98] 89 (09/17 0258) Cardiac Rhythm: Normal sinus rhythm (09/17 0400) Resp:  [15-29] 15 (09/17 0258) BP: (109-144)/(51-61) 129/55 (09/17 0258) SpO2:  [86 %-98 %] 97 % (09/17 0258)      Intake/Output from previous day: 09/16 0701 - 09/17 0700 In: 1039.9 [P.O.:960; I.V.:79.9] Out: 450 [Urine:450]   Physical Exam:  Cardiovascular: RRR Pulmonary: Clear to auscultation on left and slightly diminished right basilar breath sounds Abdomen: Soft, non tender, bowel sounds present. Extremities: No  lower extremity edema. Wounds: Clean and dry.  No erythema or signs of infection. Minor dried serous drainage on chest tube wound dressings  Lab Results: CBC: Recent Labs    10/23/18 0258  WBC 13.7*  HGB 8.0*  HCT 25.1*  PLT 351   BMET:  No results for input(s): NA, K, CL, CO2, GLUCOSE, BUN, CREATININE, CALCIUM in the last 72 hours.  PT/INR: No results for input(s): LABPROT, INR in the last 72 hours. ABG:  INR: Will add last result for INR, ABG once components are confirmed Will add last 4 CBG results once components are confirmed  Assessment/Plan: 1. CV- Previous afib with RVR  But has maintained SR for last 48 hours. On Amiodarone 200 mg bid and Toprol XL 12.5mg  daily 2. Pulmonary- On 2 liters of oxygen via [place]. Per nurse, he desatted to 85% on room air last evening. He used oxygen at night prior to surgery. CXR this am appears stable (some subcutaneous emphysema right lateral chest wall). Encourage incentive spirometer.  3. ABL anemia-Last H and H  decreased to 8 and 25.1  4.Await official CXR interpretation, but likely discharge later this am  Sharalyn Ink ZimmermanPA-C 10/25/2018,7:17 AM [phone]  Patient seen and examined, agree with above Maintaining SR- was in AF < 24 hours, no indication for anticoagulation CXR shows a little more atelectasis in right base compared to yesterday's film Dc home today  Remo Lipps C. Roxan Hockey, MD [facility] [phone]

## 2018-10-25 NOTE — Plan of Care (Signed)
  Problem: Clinical Measurements: Goal: Ability to maintain clinical measurements within normal limits will improve Outcome: Completed/Met Goal: Will remain free from infection 10/25/2018 1009 by Don Perking, RN Outcome: Completed/Met 10/25/2018 0801 by Don Perking, RN Outcome: Progressing Goal: Diagnostic test results will improve 10/25/2018 1009 by Don Perking, RN Outcome: Completed/Met 10/25/2018 0801 by Don Perking, RN Outcome: Progressing Goal: Respiratory complications will improve 10/25/2018 1009 by Don Perking, RN Outcome: Completed/Met 10/25/2018 0801 by Don Perking, RN Outcome: Progressing Goal: Cardiovascular complication will be avoided 10/25/2018 1009 by Don Perking, RN Outcome: Completed/Met 10/25/2018 0801 by Don Perking, RN Outcome: Progressing   Problem: Activity: Goal: Risk for activity intolerance will decrease 10/25/2018 1009 by Don Perking, RN Outcome: Completed/Met 10/25/2018 0801 by Don Perking, RN Outcome: Progressing   Problem: Coping: Goal: Level of anxiety will decrease 10/25/2018 1009 by Don Perking, RN Outcome: Completed/Met 10/25/2018 0801 by Don Perking, RN Outcome: Progressing   Problem: Elimination: Goal: Will not experience complications related to bowel motility 10/25/2018 1009 by Don Perking, RN Outcome: Completed/Met 10/25/2018 0801 by Don Perking, RN Outcome: Progressing   Problem: Pain Managment: Goal: General experience of comfort will improve 10/25/2018 1009 by Don Perking, RN Outcome: Completed/Met 10/25/2018 0801 by Don Perking, RN Outcome: Progressing   Problem: Safety: Goal: Ability to remain free from injury will improve 10/25/2018 1009 by Don Perking, RN Outcome: Completed/Met 10/25/2018 0801 by Don Perking, RN Outcome: Progressing   Problem: Skin Integrity: Goal: Risk for impaired skin integrity will  decrease 10/25/2018 1009 by Don Perking, RN Outcome: Completed/Met 10/25/2018 0801 by Don Perking, RN Outcome: Progressing   Problem: Activity: Goal: Risk for activity intolerance will decrease 10/25/2018 1009 by Don Perking, RN Outcome: Completed/Met 10/25/2018 0801 by Don Perking, RN Outcome: Progressing   Problem: Cardiac: Goal: Will achieve and/or maintain hemodynamic stability 10/25/2018 1009 by Don Perking, RN Outcome: Completed/Met 10/25/2018 0801 by Don Perking, RN Outcome: Progressing   Problem: Clinical Measurements: Goal: Postoperative complications will be avoided or minimized 10/25/2018 1009 by Don Perking, RN Outcome: Completed/Met 10/25/2018 0801 by Don Perking, RN Outcome: Progressing   Problem: Respiratory: Goal: Respiratory status will improve 10/25/2018 1009 by Don Perking, RN Outcome: Completed/Met 10/25/2018 0801 by Don Perking, RN Outcome: Progressing   Problem: Pain Management: Goal: Pain level will decrease 10/25/2018 1009 by Don Perking, RN Outcome: Completed/Met 10/25/2018 0801 by Don Perking, RN Outcome: Progressing   Problem: Skin Integrity: Goal: Wound healing without signs and symptoms infection will improve 10/25/2018 1009 by Don Perking, RN Outcome: Completed/Met 10/25/2018 0801 by Don Perking, RN Outcome: Progressing

## 2018-10-25 NOTE — Care Management Important Message (Signed)
Important Message  Patient Details  Name: William Kirk MRN: [id number] Date of Birth: [birth year]   Medicare Important Message Given:  Yes     Zenon Mayo, RN 10/25/2018, 8:18 AM

## 2018-10-25 NOTE — Plan of Care (Signed)
  Problem: Clinical Measurements: Goal: Will remain free from infection Outcome: Progressing Goal: Diagnostic test results will improve Outcome: Progressing Goal: Respiratory complications will improve Outcome: Progressing Goal: Cardiovascular complication will be avoided Outcome: Progressing   Problem: Activity: Goal: Risk for activity intolerance will decrease Outcome: Progressing   Problem: Coping: Goal: Level of anxiety will decrease Outcome: Progressing   Problem: Elimination: Goal: Will not experience complications related to bowel motility Outcome: Progressing   Problem: Pain Managment: Goal: General experience of comfort will improve Outcome: Progressing   Problem: Safety: Goal: Ability to remain free from injury will improve Outcome: Progressing   Problem: Skin Integrity: Goal: Risk for impaired skin integrity will decrease Outcome: Progressing   Problem: Activity: Goal: Risk for activity intolerance will decrease Outcome: Progressing   Problem: Cardiac: Goal: Will achieve and/or maintain hemodynamic stability Outcome: Progressing   Problem: Clinical Measurements: Goal: Postoperative complications will be avoided or minimized Outcome: Progressing   Problem: Respiratory: Goal: Respiratory status will improve Outcome: Progressing   Problem: Pain Management: Goal: Pain level will decrease Outcome: Progressing   Problem: Skin Integrity: Goal: Wound healing without signs and symptoms infection will improve Outcome: Progressing

## 2018-10-25 NOTE — Progress Notes (Signed)
Discussed and explained discharge instructions to wife and pt. Medication sent electronically to pharmacy. Follow up appts. Given. No complaints at this time.

## 2018-11-06 ENCOUNTER — Emergency Department (HOSPITAL_COMMUNITY): Payer: Medicare HMO

## 2018-11-06 ENCOUNTER — Inpatient Hospital Stay (HOSPITAL_COMMUNITY): Payer: Medicare HMO

## 2018-11-06 ENCOUNTER — Other Ambulatory Visit: Payer: Self-pay

## 2018-11-06 ENCOUNTER — Encounter (HOSPITAL_COMMUNITY): Payer: Self-pay | Admitting: *Deleted

## 2018-11-06 ENCOUNTER — Inpatient Hospital Stay (HOSPITAL_COMMUNITY)
Admission: EM | Admit: 2018-11-06 | Discharge: 2018-11-22 | DRG: 163 | Disposition: A | Discharge status: Home Health | Payer: Medicare HMO | Attending: Thoracic Surgery (Cardiothoracic Vascular Surgery) | Admitting: Thoracic Surgery (Cardiothoracic Vascular Surgery)

## 2018-11-06 DIAGNOSIS — T8189XA Other complications of procedures, not elsewhere classified, initial encounter: Secondary | ICD-10-CM | POA: Diagnosis not present

## 2018-11-06 DIAGNOSIS — M1990 Unspecified osteoarthritis, unspecified site: Secondary | ICD-10-CM | POA: Diagnosis present

## 2018-11-06 DIAGNOSIS — Z4682 Encounter for fitting and adjustment of non-vascular catheter: Secondary | ICD-10-CM | POA: Diagnosis not present

## 2018-11-06 DIAGNOSIS — I739 Peripheral vascular disease, unspecified: Secondary | ICD-10-CM | POA: Diagnosis present

## 2018-11-06 DIAGNOSIS — Z20828 Contact with and (suspected) exposure to other viral communicable diseases: Secondary | ICD-10-CM | POA: Diagnosis present

## 2018-11-06 DIAGNOSIS — F419 Anxiety disorder, unspecified: Secondary | ICD-10-CM | POA: Diagnosis present

## 2018-11-06 DIAGNOSIS — Z9981 Dependence on supplemental oxygen: Secondary | ICD-10-CM | POA: Diagnosis not present

## 2018-11-06 DIAGNOSIS — J869 Pyothorax without fistula: Secondary | ICD-10-CM | POA: Diagnosis not present

## 2018-11-06 DIAGNOSIS — J948 Other specified pleural conditions: Secondary | ICD-10-CM | POA: Diagnosis present

## 2018-11-06 DIAGNOSIS — J90 Pleural effusion, not elsewhere classified: Secondary | ICD-10-CM | POA: Diagnosis not present

## 2018-11-06 DIAGNOSIS — Z87891 Personal history of nicotine dependence: Secondary | ICD-10-CM

## 2018-11-06 DIAGNOSIS — J181 Lobar pneumonia, unspecified organism: Secondary | ICD-10-CM

## 2018-11-06 DIAGNOSIS — Z96641 Presence of right artificial hip joint: Secondary | ICD-10-CM | POA: Diagnosis present

## 2018-11-06 DIAGNOSIS — E43 Unspecified severe protein-calorie malnutrition: Secondary | ICD-10-CM | POA: Diagnosis present

## 2018-11-06 DIAGNOSIS — I10 Essential (primary) hypertension: Secondary | ICD-10-CM | POA: Diagnosis not present

## 2018-11-06 DIAGNOSIS — Z902 Acquired absence of lung [part of]: Secondary | ICD-10-CM

## 2018-11-06 DIAGNOSIS — Z8701 Personal history of pneumonia (recurrent): Secondary | ICD-10-CM

## 2018-11-06 DIAGNOSIS — L8990 Pressure ulcer of unspecified site, unspecified stage: Secondary | ICD-10-CM | POA: Insufficient documentation

## 2018-11-06 DIAGNOSIS — Z0189 Encounter for other specified special examinations: Secondary | ICD-10-CM

## 2018-11-06 DIAGNOSIS — Z681 Body mass index (BMI) 19 or less, adult: Secondary | ICD-10-CM

## 2018-11-06 DIAGNOSIS — N179 Acute kidney failure, unspecified: Secondary | ICD-10-CM | POA: Diagnosis not present

## 2018-11-06 DIAGNOSIS — F329 Major depressive disorder, single episode, unspecified: Secondary | ICD-10-CM | POA: Diagnosis present

## 2018-11-06 DIAGNOSIS — Z9689 Presence of other specified functional implants: Secondary | ICD-10-CM

## 2018-11-06 DIAGNOSIS — D649 Anemia, unspecified: Secondary | ICD-10-CM | POA: Diagnosis present

## 2018-11-06 DIAGNOSIS — J9589 Other postprocedural complications and disorders of respiratory system, not elsewhere classified: Secondary | ICD-10-CM | POA: Diagnosis not present

## 2018-11-06 DIAGNOSIS — Z09 Encounter for follow-up examination after completed treatment for conditions other than malignant neoplasm: Secondary | ICD-10-CM

## 2018-11-06 DIAGNOSIS — Z79899 Other long term (current) drug therapy: Secondary | ICD-10-CM

## 2018-11-06 DIAGNOSIS — K219 Gastro-esophageal reflux disease without esophagitis: Secondary | ICD-10-CM | POA: Diagnosis present

## 2018-11-06 DIAGNOSIS — Z03818 Encounter for observation for suspected exposure to other biological agents ruled out: Secondary | ICD-10-CM | POA: Diagnosis not present

## 2018-11-06 DIAGNOSIS — J479 Bronchiectasis, uncomplicated: Secondary | ICD-10-CM | POA: Diagnosis present

## 2018-11-06 DIAGNOSIS — J939 Pneumothorax, unspecified: Secondary | ICD-10-CM | POA: Diagnosis not present

## 2018-11-06 DIAGNOSIS — J690 Pneumonitis due to inhalation of food and vomit: Secondary | ICD-10-CM | POA: Diagnosis not present

## 2018-11-06 DIAGNOSIS — R918 Other nonspecific abnormal finding of lung field: Secondary | ICD-10-CM | POA: Diagnosis not present

## 2018-11-06 DIAGNOSIS — J9811 Atelectasis: Secondary | ICD-10-CM | POA: Diagnosis not present

## 2018-11-06 DIAGNOSIS — E876 Hypokalemia: Secondary | ICD-10-CM | POA: Diagnosis present

## 2018-11-06 DIAGNOSIS — Z808 Family history of malignant neoplasm of other organs or systems: Secondary | ICD-10-CM

## 2018-11-06 DIAGNOSIS — J439 Emphysema, unspecified: Secondary | ICD-10-CM | POA: Diagnosis not present

## 2018-11-06 DIAGNOSIS — Z1159 Encounter for screening for other viral diseases: Secondary | ICD-10-CM | POA: Diagnosis not present

## 2018-11-06 DIAGNOSIS — B954 Other streptococcus as the cause of diseases classified elsewhere: Secondary | ICD-10-CM | POA: Diagnosis present

## 2018-11-06 DIAGNOSIS — B961 Klebsiella pneumoniae [K. pneumoniae] as the cause of diseases classified elsewhere: Secondary | ICD-10-CM | POA: Diagnosis present

## 2018-11-06 DIAGNOSIS — E871 Hypo-osmolality and hyponatremia: Secondary | ICD-10-CM | POA: Diagnosis not present

## 2018-11-06 LAB — BASIC METABOLIC PANEL
Anion gap: 15 (ref 5–15)
BUN: 20 mg/dL (ref 8–23)
CO2: 23 mmol/L (ref 22–32)
Calcium: 8.5 mg/dL — ABNORMAL LOW (ref 8.9–10.3)
Chloride: 103 mmol/L (ref 98–111)
Creatinine, Ser: 1.09 mg/dL (ref 0.61–1.24)
GFR calc Af Amer: >60 mL/min (ref >60)
GFR calc non Af Amer: >60 mL/min (ref >60)
Glucose, Bld: 114 mg/dL — ABNORMAL HIGH (ref 70–99)
Potassium: 3.4 mmol/L — ABNORMAL LOW (ref 3.5–5.1)
Sodium: 141 mmol/L (ref 135–145)

## 2018-11-06 LAB — CBC WITH DIFFERENTIAL/PLATELET
Abs Immature Granulocytes: 0.3 10*3/uL — ABNORMAL HIGH (ref 0.00–0.07)
Basophils Absolute: 0 10*3/uL (ref 0.0–0.1)
Basophils Relative: 0 %
Eosinophils Absolute: 0 10*3/uL (ref 0.0–0.5)
Eosinophils Relative: 0 %
HCT: 24.9 % — ABNORMAL LOW (ref 39.0–52.0)
Hemoglobin: 7.5 g/dL — ABNORMAL LOW (ref 13.0–17.0)
Immature Granulocytes: 1 %
Lymphocytes Relative: 5 %
Lymphs Abs: 1.4 10*3/uL (ref 0.7–4.0)
MCH: 25.8 pg — ABNORMAL LOW (ref 26.0–34.0)
MCHC: 30.1 g/dL (ref 30.0–36.0)
MCV: 85.6 fL (ref 80.0–100.0)
Monocytes Absolute: 1 10*3/uL (ref 0.1–1.0)
Monocytes Relative: 3 %
Neutro Abs: 26.6 10*3/uL — ABNORMAL HIGH (ref 1.7–7.7)
Neutrophils Relative %: 91 %
Platelets: 600 10*3/uL — ABNORMAL HIGH (ref 150–400)
RBC: 2.91 MIL/uL — ABNORMAL LOW (ref 4.22–5.81)
RDW: 17.4 % — ABNORMAL HIGH (ref 11.5–15.5)
WBC: 29.3 10*3/uL — ABNORMAL HIGH (ref 4.0–10.5)
nRBC: 0 % (ref 0.0–0.2)

## 2018-11-06 LAB — LACTIC ACID, PLASMA: Lactic Acid, Venous: 0.9 mmol/L (ref 0.5–1.9)

## 2018-11-06 LAB — SARS CORONAVIRUS 2 BY RT PCR (HOSPITAL ORDER, PERFORMED IN ~~LOC~~ HOSPITAL LAB): SARS Coronavirus 2: NEGATIVE

## 2018-11-06 MED ORDER — CHLORHEXIDINE GLUCONATE CLOTH 2 % EX PADS
6.0000 | MEDICATED_PAD | Freq: Every day | CUTANEOUS | Status: DC
  Administered 2018-11-06 – 2018-11-22 (×14): 6 via TOPICAL

## 2018-11-06 MED ORDER — ENOXAPARIN SODIUM 40 MG/0.4ML ~~LOC~~ SOLN
40.0000 mg | SUBCUTANEOUS | Status: DC
  Administered 2018-11-06 – 2018-11-08 (×3): 40 mg via SUBCUTANEOUS
  Filled 2018-11-06 (×3): qty 0.4

## 2018-11-06 MED ORDER — MORPHINE SULFATE (PF) 2 MG/ML IV SOLN
2.0000 mg | INTRAVENOUS | Status: DC | PRN
  Administered 2018-11-10: 2 mg via INTRAVENOUS
  Filled 2018-11-06: qty 1

## 2018-11-06 MED ORDER — METOPROLOL SUCCINATE ER 25 MG PO TB24
12.5000 mg | ORAL_TABLET | Freq: Every day | ORAL | Status: DC
  Administered 2018-11-06 – 2018-11-13 (×4): 12.5 mg via ORAL
  Filled 2018-11-06 (×5): qty 1

## 2018-11-06 MED ORDER — GABAPENTIN 300 MG PO CAPS
300.0000 mg | ORAL_CAPSULE | Freq: Every day | ORAL | Status: DC
  Administered 2018-11-06 – 2018-11-08 (×3): 300 mg via ORAL
  Filled 2018-11-06 (×3): qty 1

## 2018-11-06 MED ORDER — FOLIC ACID 1 MG PO TABS
1.0000 mg | ORAL_TABLET | Freq: Every day | ORAL | Status: DC
  Administered 2018-11-06 – 2018-11-08 (×3): 1 mg via ORAL
  Filled 2018-11-06 (×3): qty 1

## 2018-11-06 MED ORDER — SERTRALINE HCL 100 MG PO TABS
200.0000 mg | ORAL_TABLET | Freq: Every day | ORAL | Status: DC
  Administered 2018-11-06 – 2018-11-08 (×3): 200 mg via ORAL
  Filled 2018-11-06 (×3): qty 2

## 2018-11-06 MED ORDER — TIOTROPIUM BROMIDE MONOHYDRATE 2.5 MCG/ACT IN AERS: 2.0000 | INHALATION_SPRAY | Freq: Every day | RESPIRATORY_TRACT | Status: DC

## 2018-11-06 MED ORDER — ADULT MULTIVITAMIN W/MINERALS CH
1.0000 | ORAL_TABLET | Freq: Every day | ORAL | Status: DC
  Administered 2018-11-06 – 2018-11-08 (×3): 1 via ORAL
  Filled 2018-11-06 (×3): qty 1

## 2018-11-06 MED ORDER — ORAL CARE MOUTH RINSE
15.0000 mL | Freq: Two times a day (BID) | OROMUCOSAL | Status: DC
  Administered 2018-11-06 – 2018-11-09 (×7): 15 mL via OROMUCOSAL

## 2018-11-06 MED ORDER — UMECLIDINIUM BROMIDE 62.5 MCG/INH IN AEPB
1.0000 | INHALATION_SPRAY | Freq: Every day | RESPIRATORY_TRACT | Status: DC
  Administered 2018-11-07 – 2018-11-22 (×11): 1 via RESPIRATORY_TRACT
  Filled 2018-11-06 (×4): qty 7

## 2018-11-06 MED ORDER — LIDOCAINE 5 % EX PTCH
1.0000 | MEDICATED_PATCH | Freq: Every day | CUTANEOUS | Status: DC | PRN
  Filled 2018-11-06: qty 1

## 2018-11-06 MED ORDER — IOHEXOL 300 MG/ML  SOLN
75.0000 mL | Freq: Once | INTRAMUSCULAR | Status: AC | PRN
  Administered 2018-11-06: 75 mL via INTRAVENOUS

## 2018-11-06 MED ORDER — POTASSIUM CHLORIDE IN NACL 20-0.9 MEQ/L-% IV SOLN
INTRAVENOUS | Status: DC
  Administered 2018-11-06: 1000 mL via INTRAVENOUS
  Administered 2018-11-06 – 2018-11-10 (×7): via INTRAVENOUS
  Filled 2018-11-06 (×9): qty 1000

## 2018-11-06 MED ORDER — VANCOMYCIN HCL 10 G IV SOLR
1500.0000 mg | Freq: Once | INTRAVENOUS | Status: AC
  Administered 2018-11-06: 1500 mg via INTRAVENOUS
  Filled 2018-11-06: qty 1500

## 2018-11-06 MED ORDER — LIDOCAINE HCL 1 % IJ SOLN
INTRAMUSCULAR | Status: AC
  Filled 2018-11-06: qty 20

## 2018-11-06 MED ORDER — MOMETASONE FURO-FORMOTEROL FUM 200-5 MCG/ACT IN AERO
2.0000 | INHALATION_SPRAY | Freq: Two times a day (BID) | RESPIRATORY_TRACT | Status: DC
  Administered 2018-11-06 – 2018-11-22 (×26): 2 via RESPIRATORY_TRACT
  Filled 2018-11-06 (×3): qty 8.8

## 2018-11-06 MED ORDER — AMIODARONE HCL 200 MG PO TABS
200.0000 mg | ORAL_TABLET | Freq: Every day | ORAL | Status: DC
  Administered 2018-11-06 – 2018-11-08 (×3): 200 mg via ORAL
  Filled 2018-11-06 (×3): qty 1

## 2018-11-06 MED ORDER — TRAZODONE HCL 50 MG PO TABS
50.0000 mg | ORAL_TABLET | Freq: Every day | ORAL | Status: DC
  Administered 2018-11-06 – 2018-11-09 (×3): 50 mg via ORAL
  Filled 2018-11-06 (×3): qty 1

## 2018-11-06 MED ORDER — ATORVASTATIN CALCIUM 10 MG PO TABS
20.0000 mg | ORAL_TABLET | Freq: Every day | ORAL | Status: DC
  Administered 2018-11-06 – 2018-11-08 (×3): 20 mg via ORAL
  Filled 2018-11-06 (×3): qty 2

## 2018-11-06 MED ORDER — VITAMIN B-12 1000 MCG PO TABS
1000.0000 ug | ORAL_TABLET | Freq: Every day | ORAL | Status: DC
  Administered 2018-11-06 – 2018-11-08 (×3): 1000 ug via ORAL
  Filled 2018-11-06 (×3): qty 1

## 2018-11-06 MED ORDER — TRAMADOL HCL 50 MG PO TABS: 50.0000 mg | ORAL_TABLET | Freq: Four times a day (QID) | ORAL | Status: DC | PRN

## 2018-11-06 MED ORDER — MIDAZOLAM HCL 2 MG/2ML IJ SOLN
INTRAMUSCULAR | Status: AC
  Filled 2018-11-06: qty 4

## 2018-11-06 MED ORDER — MIDAZOLAM HCL 2 MG/2ML IJ SOLN
INTRAMUSCULAR | Status: AC | PRN
  Administered 2018-11-06: 1 mg via INTRAVENOUS
  Administered 2018-11-06 (×2): 0.5 mg via INTRAVENOUS

## 2018-11-06 MED ORDER — DICLOFENAC SODIUM 1 % TD GEL
2.0000 g | Freq: Four times a day (QID) | TRANSDERMAL | Status: DC | PRN
  Filled 2018-11-06: qty 100

## 2018-11-06 MED ORDER — OXYCODONE HCL 5 MG PO TABS
5.0000 mg | ORAL_TABLET | ORAL | Status: DC | PRN
  Administered 2018-11-06: 16:00:00 5 mg via ORAL
  Filled 2018-11-06: qty 1

## 2018-11-06 MED ORDER — VANCOMYCIN HCL 10 G IV SOLR
1250.0000 mg | INTRAVENOUS | Status: AC
  Administered 2018-11-07 – 2018-11-13 (×6): 1250 mg via INTRAVENOUS
  Filled 2018-11-06 (×7): qty 1250

## 2018-11-06 MED ORDER — FENTANYL CITRATE (PF) 100 MCG/2ML IJ SOLN
INTRAMUSCULAR | Status: AC | PRN
  Administered 2018-11-06: 50 ug via INTRAVENOUS
  Administered 2018-11-06 (×2): 25 ug via INTRAVENOUS

## 2018-11-06 MED ORDER — PIPERACILLIN-TAZOBACTAM 3.375 G IVPB
3.3750 g | Freq: Three times a day (TID) | INTRAVENOUS | Status: DC
  Administered 2018-11-06 – 2018-11-08 (×7): 3.375 g via INTRAVENOUS
  Filled 2018-11-06 (×8): qty 50

## 2018-11-06 MED ORDER — TESTOSTERONE CYPIONATE 100 MG/ML IM SOLN: 200.0000 mg | INTRAMUSCULAR | Status: DC

## 2018-11-06 MED ORDER — FENTANYL CITRATE (PF) 100 MCG/2ML IJ SOLN
INTRAMUSCULAR | Status: AC
  Filled 2018-11-06: qty 4

## 2018-11-06 NOTE — Progress Notes (Signed)
EVENING ROUNDS NOTE :     [address]       [name] [address]             [phone]                      Total Length of Stay:  LOS: 0 days  Events:  Resting comfortably Small air leak    BP (!) 116/50   Pulse 83   Temp 98 F (36.7 C) (Oral)   Resp (!) 23   Ht 5\' 10"  (1.778 m)   Wt 67.5 kg   SpO2 98%   BMI 21.35 kg/m         . 0.9 % NaCl with KCl 20 mEq / L 100 mL/hr at 11/06/18 1200  . piperacillin-tazobactam (ZOSYN)  IV 3.375 g (11/06/18 1158)  . [START ON 11/07/2018] vancomycin      No intake/output data recorded.   CBC Latest Ref Rng & Units 11/06/2018 10/23/2018 10/22/2018  WBC 4.0 - 10.5 K/uL 29.3(H) 13.7(H) 16.7(H)  Hemoglobin 13.0 - 17.0 g/dL 7.5(L) 8.0(L) 7.7(L)  Hematocrit 39.0 - 52.0 % 24.9(L) 25.1(L) 25.1(L)  Platelets 150 - 400 K/uL 600(H) 351 342    BMP Latest Ref Rng & Units 11/06/2018 10/21/2018 10/20/2018  Glucose 70 - 99 mg/dL 114(H) 125(H) 95  BUN 8 - 23 mg/dL 20 20 20   Creatinine 0.61 - 1.24 mg/dL 1.09 1.19 1.46(H)  Sodium 135 - 145 mmol/L 141 139 140  Potassium 3.5 - 5.1 mmol/L 3.4(L) 3.8 4.1  Chloride 98 - 111 mmol/L 103 108 108  CO2 22 - 32 mmol/L 23 25 23   Calcium 8.9 - 10.3 mg/dL 8.5(L) 8.3(L) 8.2(L)    ABG    Component Value Date/Time   PHART 7.290 (L) 10/19/2018 0428   PCO2ART 49.9 (H) 10/19/2018 0428   PO2ART 83.0 10/19/2018 0428   HCO3 24.1 10/19/2018 0428   TCO2 26 10/19/2018 0428   ACIDBASEDEF 3.0 (H) 10/19/2018 0428   O2SAT 95.0 10/19/2018 0428       Melodie Bouillon, MD 11/06/2018 4:58 PM

## 2018-11-06 NOTE — ED Provider Notes (Addendum)
[facility] EMERGENCY DEPARTMENT Provider Note   CSN: [id number] Arrival date & time: 11/06/18  [id number]     History   Chief Complaint No chief complaint on file.   HPI William [place] is a 78 y.o. male.     Patient presents to the emergency department for evaluation of bleeding from the right chest wall.  Patient underwent VATS/right lower lobectomy on September 10.  Patient had his chest tube removed on September 16.  He reports that he has been draining a fair amount of brownish fluid since the tube was removed but tonight he has had a significant increase in the amount of drainage.  He is not experiencing any pain.  He is chronically on oxygen, EMS report that he was 89% on his normal 3 L, increased to 4 L during transport with improvement of his oxygen saturations.  He does not feel short of breath at arrival.     Past Medical History:  Diagnosis Date  . Anxiety   . Arthritis    "hands" (09/21/2016)  . Bleeding stomach ulcer 2000s  . COPD (chronic obstructive pulmonary disease) ([place])   . Cough   . Depression   . Dyspnea   . Hypertension   . Pneumonia    "now and several times before this" (09/21/2016)  . PVD (peripheral vascular disease) ([place]) 2014   prev PTCA on RLE    Patient Active Problem List   Diagnosis Date Noted  . 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 Hemoptysis 07/07/2018  . Acute renal failure (ARF) ([place]) 07/07/2018  . Acute respiratory failure ([place]) 07/07/2018  . Chronic obstructive pulmonary disease ([place]) 11/07/2016  . Chronic respiratory failure with hypoxia ([place]) 11/07/2016  . Pulmonary infiltrates 11/07/2016  . Swallowing dysfunction 09/26/2016  . Depression 09/21/2016  . Aspiration pneumonia ([place]) 09/21/2016  . HTN (hypertension) 09/21/2016  . HCAP (healthcare-associated pneumonia)   . COPD with acute exacerbation ([place]) 06/21/2016  . Acute respiratory failure with hypoxia ([place]) 06/21/2016  . Hypokalemia 06/21/2016  .  Hyponatremia 06/21/2016  . Thrombocytosis ([place]) 06/21/2016  . PVD (peripheral vascular disease) ([place]) 06/21/2016  . Bronchiectasis ([place]) 06/21/2016  . Protein-calorie malnutrition, severe ([place]) 06/21/2016  . Multifocal pneumonia 06/20/2016  . FUO (fever of unknown origin) 04/15/2015  . 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 Other tear of medial meniscus, current injury, unspecified knee, initial encounter 12/03/2012    Past Surgical History:  Procedure Laterality Date  . IR ANGIOGRAM SELECTIVE EACH ADDITIONAL VESSEL  07/26/2018  . IR ANGIOGRAM SELECTIVE EACH ADDITIONAL VESSEL  07/26/2018  . IR ANGIOGRAM SELECTIVE EACH ADDITIONAL VESSEL  07/26/2018  . IR EMBO ART  VEN HEMORR LYMPH EXTRAV  INC GUIDE ROADMAPPING  07/26/2018  . IR US GUIDE VASC ACCESS RIGHT  07/26/2018  . JOINT REPLACEMENT    . KNEE ARTHROSCOPY Right X 1  . TOTAL HIP ARTHROPLASTY Right 2009  . VIDEO ASSISTED THORACOSCOPY (VATS)/ LOBECTOMY Right 10/18/2018   Procedure: VIDEO ASSISTED THORACOSCOPY (VATS)/ right lower LOBECTOMY;  Surgeon: Melrose Nakayama, MD;  Location: [facility];  Service: Thoracic;  Laterality: Right;  Marland Kitchen VIDEO BRONCHOSCOPY N/A 07/27/2018   Procedure: VIDEO BRONCHOSCOPY WITHOUT FLUORO;  Surgeon: Collene Gobble, MD;  Location: [facility];  Service: Cardiopulmonary;  Laterality: N/A;        Home Medications    Prior to Admission medications   Medication Sig Start Date End Date Taking? Authorizing  Provider  acetaminophen (TYLENOL) 500 MG tablet Take 500 mg by mouth 3 (three) times daily.    [provider]  albuterol (PROVENTIL) (2.5 MG/3ML) 0.083% nebulizer solution Take 3 mLs (2.5 mg total) by nebulization 2 (two) times a day for 30 days. 07/11/18 10/09/18  Hongalgi, Lenis Dickinson, MD  albuterol (VENTOLIN HFA) 108 (90 Base) MCG/ACT inhaler Inhale 2 puffs into the lungs every 4 (four) hours as needed for wheezing or  shortness of breath. 07/11/18   Hongalgi, Lenis Dickinson, MD  amiodarone (PACERONE) 200 MG tablet Take 1 tablet (200 mg total) by mouth 2 (two) times daily. For 5 days then take 200 mg daily thereafter 10/25/18   Nani Skillern, PA-C  atorvastatin (LIPITOR) 20 MG tablet Take 20 mg by mouth at bedtime.    [provider]  budesonide-formoterol (SYMBICORT) 160-4.5 MCG/ACT inhaler Inhale 2 puffs into the lungs 2 (two) times daily.    [provider]  diclofenac sodium (VOLTAREN) 1 % GEL Apply topically 4 (four) times daily as needed.    [provider]  gabapentin (NEURONTIN) 300 MG capsule Take 300 mg by mouth at bedtime.     [provider]  lidocaine (LIDODERM) 5 % Place 1 patch onto the skin daily. Remove & Discard patch within 12 hours or as directed by MD    [provider]  metoprolol succinate (TOPROL-XL) 25 MG 24 hr tablet Take 0.5 tablets (12.5 mg total) by mouth daily. 10/26/18   Nani Skillern, PA-C  Multiple Vitamin (MULTIVITAMIN WITH MINERALS) TABS tablet Take 1 tablet by mouth daily.    [provider]  OXYGEN Inhale 2-3 L into the lungs See admin instructions. Inhale 3 liters of oxygen during the day and 2 liters at bedtime    [provider]  sertraline (ZOLOFT) 100 MG tablet Take 200 mg by mouth daily.     [provider]  testosterone cypionate (DEPOTESTOTERONE CYPIONATE) 100 MG/ML injection Inject 200 mg into the muscle every 28 (twenty-eight) days.     [provider]  Tiotropium Bromide Monohydrate (SPIRIVA RESPIMAT) 2.5 MCG/ACT AERS Inhale 2 puffs into the lungs daily.    [provider]  traMADol (ULTRAM) 50 MG tablet Take 1 tablet (50 mg total) by mouth every 6 (six) hours as needed (mild pain). 10/25/18   Nani Skillern, PA-C  traZODone (DESYREL) 50 MG tablet Take 50 mg by mouth at bedtime.    [provider]  vitamin B-12 (CYANOCOBALAMIN) 1000 MCG tablet Take 1,000 mcg by  mouth daily.    [provider]    Family History Family History  Problem Relation Age of Onset  . Cancer Father 63       Brain  . Dementia Mother     Social History Social History   Tobacco Use  . Smoking status: Former Smoker    Packs/day: 1.00    Years: 55.00    Pack years: 55.00    Types: Cigarettes    Quit date: 04/2015    Years since quitting: 3.5  . Smokeless tobacco: Never Used  Substance Use Topics  . Alcohol use: No    Alcohol/week: 0.0 standard drinks  . Drug use: No     Allergies   Patient has no known allergies.   Review of Systems Review of Systems  Constitutional: Negative for fever.  Skin: Positive for wound.  All other systems reviewed and are negative.    Physical Exam Updated Vital Signs There were no  vitals taken for this visit.  Physical Exam Vitals signs and nursing note reviewed.  Constitutional:      General: He is not in acute distress.    Appearance: Normal appearance. He is well-developed.  HENT:     Head: Normocephalic and atraumatic.     Right Ear: Hearing normal.     Left Ear: Hearing normal.     Nose: Nose normal.  Eyes:     Conjunctiva/sclera: Conjunctivae normal.     Pupils: Pupils are equal, round, and reactive to light.  Neck:     Musculoskeletal: Normal range of motion and neck supple.  Cardiovascular:     Rate and Rhythm: Regular rhythm.     Heart sounds: S1 normal and S2 normal. No murmur. No friction rub. No gallop.   Pulmonary:     Effort: Pulmonary effort is normal. No respiratory distress.     Breath sounds: Normal breath sounds.  Chest:     Chest wall: No tenderness.  Abdominal:     General: Bowel sounds are normal.     Palpations: Abdomen is soft.     Tenderness: There is no abdominal tenderness. There is no guarding or rebound. Negative signs include Murphy's sign and McBurney's sign.     Hernia: No hernia is present.  Musculoskeletal: Normal range of motion.  Skin:    General: Skin is  warm and dry.     Findings: No rash.     Comments: Incision right mid axillary line draining a large amount of foul-smelling brownish fluid  Neurological:     Mental Status: He is alert and oriented to person, place, and time.     GCS: GCS eye subscore is 4. GCS verbal subscore is 5. GCS motor subscore is 6.     Cranial Nerves: No cranial nerve deficit.     Sensory: No sensory deficit.     Coordination: Coordination normal.  Psychiatric:        Speech: Speech normal.        Behavior: Behavior normal.        Thought Content: Thought content normal.      ED Treatments / Results  Labs (all labs ordered are listed, but only abnormal results are displayed) Labs Reviewed  CBC WITH DIFFERENTIAL/PLATELET  BASIC METABOLIC PANEL  LACTIC ACID, PLASMA    EKG EKG Interpretation  Date/Time:  Tuesday November 06 2018 07:03:32 EDT Ventricular Rate:  91 PR Interval:    QRS Duration: 94 QT Interval:  413 QTC Calculation: 509 R Axis:   78 Text Interpretation:  Sinus rhythm Short PR interval Minimal ST depression, inferior leads Prolonged QT interval Confirmed by Orpah Greek [phone]) on 11/06/2018 7:10:58 AM   Radiology No results found.  Procedures Procedures (including critical care time)  Medications Ordered in ED Medications - No data to display   Initial Impression / Assessment and Plan / ED Course  I have reviewed the triage vital signs and the nursing notes.  Pertinent labs & imaging results that were available during my care of the patient were reviewed by me and considered in my medical decision making (see chart for details).        Patient presents to the emergency department for evaluation of increased drainage from his right chest wall.  Patient underwent right lower lobectomy on September 10.  He reports that he has been experiencing a moderate amount of drainage from the surgical site since his chest tube was removed on the 16th.  He does  report that the  fluid has been foul-smelling.  Tonight a significant increase in the amount of fluid was noted and he was mildly hypoxic.  Fluid is dark brown and extremely foul-smelling.  He does not have a fever but he does have a significant leukocytosis, white blood cell count is 29.  Discussed with Dr. Orvan Seen, on-call for cardiothoracic surgery.  Recommend CT scan to determine if there is an empyema.  He will have Dr. Roxan Hockey, who performed the original surgery, evaluate the patient later this morning.  Final Clinical Impressions(s) / ED Diagnoses   Final diagnoses:  None  drainage from operative site  ED Discharge Orders    None       Orpah Greek, MD 11/06/18 ED:[id number]    Orpah Greek, MD 11/06/18 [phone]

## 2018-11-06 NOTE — H&P (Signed)
William Kirk is an 78 y.o. male.   Chief Complaint: drainage from incision  HPI: 78 yo man with recent right lower lobectomy for bronchiectasis presents with cc/o shortness of breath and drainage from incision.  William Kirk is a 78 year old gentleman with a history of tobacco abuse,COPD, bronchiectasis, recurrent pneumonia, recurrent hemoptysis, peripheral arterial disease, gastroesophageal reflux, and depression. He has had multiple pneumonias with chronic bronchiectasis and atelectasis in the right lower lobe. He was admitted twice earlier this year with massive hemoptysis. He underwent a thoracoscopic right lower lobectomy on 10/18/2018. His post operative course was complicated by atrial fibrillation which converted to SR with amiodarone.  Was feeling well until yesterday when he noted increasing pain on the right side and shortness of breath, Also general malaise but no fever. Increased his home O2 from 2 to 3 L. Started taking Augmentin which he had been on at home. Then developed drainage of brown, foul-smelling fluid from incision. Came to ED. CT shows a complicated effusion, some atelectasis and SQ emphysema.   Past Medical History:  Diagnosis Date  . Anxiety   . Arthritis    "hands" (09/21/2016)  . Bleeding stomach ulcer 2000s  . COPD (chronic obstructive pulmonary disease) ([place])   . Cough   . Depression   . Dyspnea   . Hypertension   . Pneumonia    "now and several times before this" (09/21/2016)  . PVD (peripheral vascular disease) ([place]) 2014   prev PTCA on RLE    Past Surgical History:  Procedure Laterality Date  . IR ANGIOGRAM SELECTIVE EACH ADDITIONAL VESSEL  07/26/2018  . IR ANGIOGRAM SELECTIVE EACH ADDITIONAL VESSEL  07/26/2018  . IR ANGIOGRAM SELECTIVE EACH ADDITIONAL VESSEL  07/26/2018  . IR EMBO ART  VEN HEMORR LYMPH EXTRAV  INC GUIDE ROADMAPPING  07/26/2018  . IR US GUIDE VASC ACCESS RIGHT  07/26/2018  . JOINT REPLACEMENT    . KNEE ARTHROSCOPY Right X 1  .  TOTAL HIP ARTHROPLASTY Right 2009  . VIDEO ASSISTED THORACOSCOPY (VATS)/ LOBECTOMY Right 10/18/2018   Procedure: VIDEO ASSISTED THORACOSCOPY (VATS)/ right lower LOBECTOMY;  Surgeon: Melrose Nakayama, MD;  Location: [facility];  Service: Thoracic;  Laterality: Right;  Marland Kitchen VIDEO BRONCHOSCOPY N/A 07/27/2018   Procedure: VIDEO BRONCHOSCOPY WITHOUT FLUORO;  Surgeon: Collene Gobble, MD;  Location: [facility];  Service: Cardiopulmonary;  Laterality: N/A;    Family History  Problem Relation Age of Onset  . Cancer Father 73       Brain  . Dementia Mother    Social History:  reports that he quit smoking about 3 years ago. His smoking use included cigarettes. He has a 55.00 pack-year smoking history. He has never used smokeless tobacco. He reports that he does not drink alcohol or use drugs.  Allergies: No Known Allergies  (Not in a hospital admission)   Results for orders placed or performed during the hospital encounter of 11/06/18 (from the past 48 hour(s))  CBC with Differential/Platelet     Status: Abnormal   Collection Time: 11/06/18  5:42 AM  Result Value Ref Range   WBC 29.3 (H) 4.0 - 10.5 K/uL   RBC 2.91 (L) 4.22 - 5.81 MIL/uL   Hemoglobin 7.5 (L) 13.0 - 17.0 g/dL   HCT 24.9 (L) 39.0 - 52.0 %   MCV 85.6 80.0 - 100.0 fL   MCH 25.8 (L) 26.0 - 34.0 pg   MCHC 30.1 30.0 - 36.0 g/dL   RDW 17.4 (H) 11.5 - 15.5 %  Platelets 600 (H) 150 - 400 K/uL   nRBC 0.0 0.0 - 0.2 %   Neutrophils Relative % 91 %   Neutro Abs 26.6 (H) 1.7 - 7.7 K/uL   Lymphocytes Relative 5 %   Lymphs Abs 1.4 0.7 - 4.0 K/uL   Monocytes Relative 3 %   Monocytes Absolute 1.0 0.1 - 1.0 K/uL   Eosinophils Relative 0 %   Eosinophils Absolute 0.0 0.0 - 0.5 K/uL   Basophils Relative 0 %   Basophils Absolute 0.0 0.0 - 0.1 K/uL   Immature Granulocytes 1 %   Abs Immature Granulocytes 0.30 (H) 0.00 - 0.07 K/uL    Comment: Performed at [facility] [address] [place], [place] [id number]  Basic metabolic panel      Status: Abnormal   Collection Time: 11/06/18  5:42 AM  Result Value Ref Range   Sodium 141 135 - 145 mmol/L   Potassium 3.4 (L) 3.5 - 5.1 mmol/L   Chloride 103 98 - 111 mmol/L   CO2 23 22 - 32 mmol/L   Glucose, Bld 114 (H) 70 - 99 mg/dL   BUN 20 8 - 23 mg/dL   Creatinine, Ser 1.09 0.61 - 1.24 mg/dL   Calcium 8.5 (L) 8.9 - 10.3 mg/dL   GFR calc non Af Amer >60 >60 mL/min   GFR calc Af Amer >60 >60 mL/min   Anion gap 15 5 - 15    Comment: Performed at [facility] [address], [place], [place] [address]  Lactic acid, plasma     Status: None   Collection Time: 11/06/18  5:42 AM  Result Value Ref Range   Lactic Acid, Venous 0.9 0.5 - 1.9 mmol/L    Comment: Performed at [facility] [address] [place], [place] [address]  SARS Coronavirus 2 [facility] order, Performed in [facility] hospital lab) Nasopharyngeal Nasopharyngeal Swab     Status: None   Collection Time: 11/06/18  6:34 AM   Specimen: Nasopharyngeal Swab  Result Value Ref Range   SARS Coronavirus 2 NEGATIVE NEGATIVE    Comment: (NOTE) If result is NEGATIVE SARS-CoV-2 target nucleic acids are NOT DETECTED. The SARS-CoV-2 RNA is generally detectable in upper and lower  respiratory specimens during the acute phase of infection. The lowest  concentration of SARS-CoV-2 viral copies this assay can detect is 250  copies / mL. A negative result does not preclude SARS-CoV-2 infection  and should not be used as the sole basis for treatment or other  patient management decisions.  A negative result may occur with  improper specimen collection / handling, submission of specimen other  than nasopharyngeal swab, presence of viral mutation(s) within the  areas targeted by this assay, and inadequate number of viral copies  (<250 copies / mL). A negative result must be combined with clinical  observations, patient history, and epidemiological information. If result is POSITIVE SARS-CoV-2 target nucleic acids are  DETECTED. The SARS-CoV-2 RNA is generally detectable in upper and lower  respiratory specimens dur ing the acute phase of infection.  Positive  results are indicative of active infection with SARS-CoV-2.  Clinical  correlation with patient history and other diagnostic information is  necessary to determine patient infection status.  Positive results do  not rule out bacterial infection or co-infection with other viruses. If result is PRESUMPTIVE POSTIVE SARS-CoV-2 nucleic acids MAY BE PRESENT.   A presumptive positive result was obtained on the submitted specimen  and confirmed on  repeat testing.  While 2019 novel coronavirus  (SARS-CoV-2) nucleic acids may be present in the submitted sample  additional confirmatory testing may be necessary for epidemiological  and / or clinical management purposes  to differentiate between  SARS-CoV-2 and other Sarbecovirus currently known to infect humans.  If clinically indicated additional testing with an alternate test  methodology [phone]) is advised. The SARS-CoV-2 RNA is generally  detectable in upper and lower respiratory sp ecimens during the acute  phase of infection. The expected result is Negative. Fact Sheet for Patients:  StrictlyIdeas.no Fact Sheet for Healthcare Providers: BankingDealers.co.za This test is not yet approved or cleared by the [place] FDA and has been authorized for detection and/or diagnosis of SARS-CoV-2 by FDA under an Emergency Use Authorization (EUA).  This EUA will remain in effect (meaning this test can be used) for the duration of the COVID-19 declaration under Section 564(b)(1) of the Act, 21 U.S.C. section 360bbb-3(b)(1), unless the authorization is terminated or revoked sooner. Performed at [facility], [place] [address], [place], [place] [address]    Ct Chest W Contrast  Result Date: 11/06/2018 CLINICAL DATA:  Worsening shortness of breath.  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 Elevated white blood cell count. EXAM: CT CHEST WITH CONTRAST TECHNIQUE: Multidetector CT imaging of the chest was performed during intravenous contrast administration. CONTRAST:  75 mL OMNIPAQUE IOHEXOL 300 MG/ML  SOLN COMPARISON:  Single-view of the chest 11/06/2018 and PA and lateral chest 10/25/2018. CT chest 07/26/2018. FINDINGS: Cardiovascular: No significant vascular findings. Normal heart size. No pericardial effusion. Extensive calcific aortic and coronary atherosclerosis noted. Mediastinum/Nodes: A precarinal lymph node measuring 1.8 cm on image 87 is increased in size from 0.6 cm on the prior CT and is likely reactive. The thyroid gland and esophagus appear normal. Lungs/Pleura: The patient has a right pleural effusion with multiple locules of air present within the fluid collection and an air-fluid level anteriorly. The pleura of the right lower chest is somewhat hyperenhancing. Air-fluid collection in the lateral aspect of the right chest wall measures 4.2 cm AP x 1.6 cm transverse x 2.5 cm craniocaudal. There is volume loss in the right chest consistent with lobectomy. Lungs again demonstrate changes of emphysema. 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 Upper Abdomen: No acute finding.  Atrophy of the right kidney noted. Musculoskeletal: No acute or focal bony abnormality. IMPRESSION: Status post right lower lobectomy. Mild hyperenhancement of pleura in the right lower chest with a pleural fluid collection containing locules of gas could be postoperative but is worrisome for empyema given the patient's elevated white blood cell count. Small air and fluid collection in the right chest wall is likely related to prior chest tube but could be a small abscess. Multifocal micronodularity most suggestive of chronic atypical infection,  stable in appearance. Aortic Atherosclerosis (ICD10-I70.0) and Emphysema (ICD10-J43.9). Electronically Signed   By: Inge Rise M.D.   On: 11/06/2018 07:49   Dg Chest Port 1 View  Result Date: 11/06/2018 CLINICAL DATA:  Leaking fluid after V ats EXAM: PORTABLE CHEST 1 VIEW COMPARISON:  Twelve days ago FINDINGS: Postoperative volume loss and opacification on the right with hazy density attributed to pleural fluid. There is superimposed lucency over the right base likely reflecting a basilar pneumothorax. Right chest wall emphysema is diminished but unexpectedly persistent. Clear and hyperinflated left lung.  Normal heart size IMPRESSION: 1. Probable hydropneumothorax at  the postoperative right base. 2. Right chest wall emphysema is decreased from 12 days ago but notably persistent. Electronically Signed   By: Monte Fantasia M.D.   On: 11/06/2018 06:14   I personally reviewed the CXR and CT chest and concur with the findings noted above  Review of Systems  Constitutional: Positive for malaise/fatigue and weight loss. Negative for fever.       Poor appetite  Respiratory: Positive for shortness of breath.   Gastrointestinal: Negative for nausea and vomiting.    Blood pressure (!) 122/57, pulse 76, temperature 98.7 F (37.1 C), temperature source Oral, resp. rate (!) 27, SpO2 96 %. Physical Exam  Vitals reviewed. Constitutional: He is oriented to person, place, and time. He appears distressed (mildly).  Ill-appearing  HENT:  Head: Normocephalic and atraumatic.  Mouth/Throat: No oropharyngeal exudate.  Eyes: Conjunctivae and EOM are normal. No scleral icterus.  Neck: Neck supple. No tracheal deviation present. No thyromegaly present.  Cardiovascular: Normal rate and regular rhythm.  No murmur heard. Respiratory: No respiratory distress. He has no wheezes. He has no rales. He exhibits tenderness.  Brownish drainage from incision, foul smelling. Tachypnic,, absent BS right base  GI:  Soft. He exhibits no distension. There is no abdominal tenderness.  Musculoskeletal:        General: No edema.  Lymphadenopathy:    He has no cervical adenopathy.  Neurological: He is alert and oriented to person, place, and time. No cranial nerve deficit. He exhibits normal muscle tone.  Skin: Skin is warm and dry.     Assessment/Plan 78 yo man with a history of tobacco abuse,COPD, bronchiectasis, recurrent pneumonia, recurrent hemoptysis, peripheral arterial disease, gastroesophageal reflux, protein calorie malnutrition, and depression. Underwent a thoracoscopic right lower lobectomy on 10/18/2018. Now presents with increasing pain, shortness of breath, and drainage from his incision. Afebrile but WBC markedly elevated. CT findings c/w empyema.  Admit Start broad spectrum antibiotics- vanco and zosyn Will have IR place drain in right pleural space to drain the effusion. He may ultimately require thoracotomy. NPO until after drained placed then advance diet lovenox + SCD for DVT prophylaxis Cardiac monitoring Continue bronchodilators   Melrose Nakayama, MD 11/06/2018, 8:53 AM

## 2018-11-06 NOTE — ED Notes (Signed)
Attempted report 

## 2018-11-06 NOTE — ED Triage Notes (Signed)
Pt arrived by EMS for drainage from R chest incision. Pt had a R lower lobectomy on 9/10, is due to have sutures removed today. Pt reports this morning he woke up to oozing from site, saturated two different dressings at home. On arrival brown drainage noted from incision with foul odor. EMS reported sats of 89% on 3L at home, increased to 4L, sats 93%.

## 2018-11-06 NOTE — ED Provider Notes (Signed)
Patient care assumed at 0700.  CT scan performed - demonstrates empyema.  He was seen in ED by CT surgery with plan to admit for further treatment.     Quintella Reichert, MD 11/06/18 1007

## 2018-11-06 NOTE — Procedures (Signed)
Interventional Radiology Procedure Note  Procedure: CT Guided Right Pleural Drain Placement  Complications: None  Estimated Blood Loss: < 10 mL  Findings: 14 Fr drain placed in right pleural space with return of brown, turbid, foul smelling fluid. Fluid sample sent for culture analysis. Drain attached to [employer].   Will follow.  Venetia Night. Kathlene Cote, M.D Pager:  [phone]

## 2018-11-06 NOTE — Progress Notes (Signed)
Pharmacy Antibiotic Note  William Kirk is a 78 y.o. male admitted on 11/06/2018 with wound infection.  Pharmacy has been consulted for vancomycin dosing. Pt is afebrile and WBC is elevated at 29.3. Scr is WNL at 1.09 and lactic acid is normal.   Plan: Vancomycin 1500mg  IV x 1 then 1250mg  IV Q24H F/u renal fxn, C&S, clinical status and peak/trough at SS  Height: 5\' 10"  (177.8 cm) Weight: 146 lb 13.2 oz (66.6 kg) IBW/kg (Calculated) : 73  Temp (24hrs), Avg:98.7 F (37.1 C), Min:98.7 F (37.1 C), Max:98.7 F (37.1 C)  Recent Labs  Lab 11/06/18 0542  WBC 29.3*  CREATININE 1.09  LATICACIDVEN 0.9    Estimated Creatinine Clearance: 54.3 mL/min (by C-G formula based on SCr of 1.09 mg/dL).    No Known Allergies  Antimicrobials this admission: Vanc 9/29>> Zosyn 9/29>>  Dose adjustments this admission: N/A  Microbiology results: Pending  Thank you for allowing pharmacy to be a part of this patient's care.  Ezrael Sam, Rande Lawman 11/06/2018 9:04 AM

## 2018-11-06 NOTE — Consult Note (Signed)
Chief Complaint: Patient was seen in consultation today for right chest tube placement.  Referring Physician(s): Dr. Roxan Hockey  Supervising Physician: Aletta Edouard  Patient Status: [facility] - ED  History of Present Illness: William Kirk is a 78 y.o. male with a past medical history significant for anxiety, depression, arthritis, HTN, PVD, COPD on chronic supplemental O2 at home, tobacco abuse, bronchiectasis, recurrent pneumonia, recurrent hemoptysis who is s/p thorascopic right lower lobectomy 10/18/18 with Dr. Roxan Hockey who presented to [facility] ED early this morning with complaints of bleeding from the right chest wall. He reported that his chest tube had been removed on 9/16 and he has had brownish discharge from the previous insertion site ever since - however last night he noted a large increase in the amount of drainage and presented to the ED for further evaluation. He was noted to be afebrile upon arrival with WBC 29.3. A CT chest with contrast was performed which showed a right pleural fluid collection containing locules of gas which could be postoperative but is worrisome for empyema given leukocytosis, small air and fluid collection in the right chest wall likely related to prior chest tube but could be a small abscess. He was seen by Dr. Roxan Hockey this morning who has requested a right chest tube placement in IR to drain the effusion.   Patient states he feels ok, he has been feeling rundown overall since his surgery and has been unable to eat very much as he has little appetite. He states his breathing is at baseline right now but he is a little anxious about having another chest tube put in which makes him breathe faster. He reports some pain at the previous chest tube site. He states he last had something to drink very early this morning. He states understanding of requested procedure and wishes to proceed.   Past Medical History:  Diagnosis Date   Anxiety    Arthritis     "hands" (09/21/2016)   Bleeding stomach ulcer 2000s   COPD (chronic obstructive pulmonary disease) (HCC)    Cough    Depression    Dyspnea    Hypertension    Pneumonia    "now and several times before this" (09/21/2016)   PVD (peripheral vascular disease) ([place]) 2014   prev PTCA on RLE    Past Surgical History:  Procedure Laterality Date   IR ANGIOGRAM SELECTIVE EACH ADDITIONAL VESSEL  07/26/2018   IR ANGIOGRAM SELECTIVE EACH ADDITIONAL VESSEL  07/26/2018   IR ANGIOGRAM SELECTIVE EACH ADDITIONAL VESSEL  07/26/2018   IR EMBO ART  VEN HEMORR LYMPH EXTRAV  INC GUIDE ROADMAPPING  07/26/2018   IR US GUIDE VASC ACCESS RIGHT  07/26/2018   JOINT REPLACEMENT     KNEE ARTHROSCOPY Right X 1   TOTAL HIP ARTHROPLASTY Right 2009   VIDEO ASSISTED THORACOSCOPY (VATS)/ LOBECTOMY Right 10/18/2018   Procedure: VIDEO ASSISTED THORACOSCOPY (VATS)/ right lower LOBECTOMY;  Surgeon: Melrose Nakayama, MD;  Location: [place];  Service: Thoracic;  Laterality: Right;   VIDEO BRONCHOSCOPY N/A 07/27/2018   Procedure: VIDEO BRONCHOSCOPY WITHOUT FLUORO;  Surgeon: Collene Gobble, MD;  Location: [place];  Service: Cardiopulmonary;  Laterality: N/A;    Allergies: Patient has no known allergies.  Medications: Prior to Admission medications   Medication Sig Start Date End Date Taking? Authorizing Provider  acetaminophen (TYLENOL) 500 MG tablet Take 500 mg by mouth 3 (three) times daily.   Yes [provider]  albuterol (PROVENTIL) (2.5 MG/3ML) 0.083% nebulizer solution Take  3 mLs (2.5 mg total) by nebulization 2 (two) times a day for 30 days. 07/11/18 11/06/18 Yes Hongalgi, Lenis Dickinson, MD  albuterol (VENTOLIN HFA) 108 (90 Base) MCG/ACT inhaler Inhale 2 puffs into the lungs every 4 (four) hours as needed for wheezing or shortness of breath. 07/11/18  Yes Hongalgi, Lenis Dickinson, MD  amiodarone (PACERONE) 200 MG tablet Take 1 tablet (200 mg total) by mouth 2 (two) times daily. For 5 days then take 200 mg daily  thereafter Patient taking differently: Take 200 mg by mouth daily.  10/25/18  Yes Lars Pinks M, PA-C  amoxicillin-clavulanate (AUGMENTIN) 875-125 MG tablet Take 1 tablet by mouth 2 (two) times daily.   Yes [provider]  atorvastatin (LIPITOR) 20 MG tablet Take 20 mg by mouth at bedtime.   Yes [provider]  budesonide-formoterol (SYMBICORT) 160-4.5 MCG/ACT inhaler Inhale 2 puffs into the lungs 2 (two) times daily.   Yes [provider]  diclofenac sodium (VOLTAREN) 1 % GEL Apply 2 g topically 4 (four) times daily as needed (pain).    Yes [provider]  gabapentin (NEURONTIN) 300 MG capsule Take 300 mg by mouth at bedtime.    Yes [provider]  lidocaine (LIDODERM) 5 % Place 1 patch onto the skin daily as needed (pain).    Yes [provider]  metoprolol succinate (TOPROL-XL) 25 MG 24 hr tablet Take 0.5 tablets (12.5 mg total) by mouth daily. 10/26/18  Yes Lars Pinks M, PA-C  Multiple Vitamin (MULTIVITAMIN WITH MINERALS) TABS tablet Take 1 tablet by mouth daily.   Yes [provider]  OXYGEN Inhale 2-3 L into the lungs See admin instructions. Inhale 3 liters of oxygen during the day and 2 liters at bedtime   Yes [provider]  sertraline (ZOLOFT) 100 MG tablet Take 200 mg by mouth daily.    Yes [provider]  testosterone cypionate (DEPOTESTOTERONE CYPIONATE) 100 MG/ML injection Inject 200 mg into the muscle every 28 (twenty-eight) days.    Yes [provider]  Tiotropium Bromide Monohydrate (SPIRIVA RESPIMAT) 2.5 MCG/ACT AERS Inhale 2 puffs into the lungs daily.   Yes [provider]  traMADol (ULTRAM) 50 MG tablet Take 1 tablet (50 mg total) by mouth every 6 (six) hours as needed (mild pain). 10/25/18  Yes Lars Pinks M, PA-C  traZODone (DESYREL) 50 MG tablet Take 50 mg by mouth at bedtime.   Yes [provider]  vitamin B-12 (CYANOCOBALAMIN) 1000 MCG tablet  Take 1,000 mcg by mouth daily.   Yes [provider]     Family History  Problem Relation Age of Onset   Cancer Father 28       Brain   Dementia Mother     Social History   Socioeconomic History   Marital status: Married    Spouse name: Not on file   Number of children: Not on file   Years of education: 16   Highest education level: Not on file  Occupational History   Occupation: Event organiser  Social Needs   Financial resource strain: Not on file   Food insecurity    Worry: Not on file    Inability: Not on file   Transportation needs    Medical: Not on file    Non-medical: Not on file  Tobacco Use   Smoking status: Former Smoker    Packs/day: 1.00    Years: 55.00    Pack years: 55.00    Types: Cigarettes  Quit date: 04/2015    Years since quitting: 3.5   Smokeless tobacco: Never Used  Substance and Sexual Activity   Alcohol use: No    Alcohol/week: 0.0 standard drinks   Drug use: No   Sexual activity: Not Currently    Partners: Female  Lifestyle   Physical activity    Days per week: Not on file    Minutes per session: Not on file   Stress: Not on file  Relationships   Social connections    Talks on phone: Not on file    Gets together: Not on file    Attends religious service: Not on file    Active member of club or organization: Not on file    Attends meetings of clubs or organizations: Not on file    Relationship status: Not on file  Other Topics Concern   Not on file  Social History Narrative   Not on file     Review of Systems: A 12 point ROS discussed and pertinent positives are indicated in the HPI above.  All other systems are negative.  Review of Systems  Constitutional: Positive for appetite change and fatigue. Negative for chills and fever.  HENT: Negative for nosebleeds.   Respiratory: Positive for shortness of breath (at baseline). Negative for cough.   Cardiovascular: Positive for chest pain (at  previous chest tube site).  Gastrointestinal: Negative for abdominal pain, blood in stool, diarrhea, nausea and vomiting.  Genitourinary: Negative for hematuria.  Skin: Positive for wound (Right chest ).  Neurological: Negative for dizziness and headaches.    Vital Signs: BP (!) 122/57    Pulse 76    Temp 98.7 F (37.1 C) (Oral)    Resp (!) 27    Ht 5\' 10"  (1.778 m)    Wt 146 lb 13.2 oz (66.6 kg)    SpO2 96%    BMI 21.07 kg/m   Physical Exam Vitals signs and nursing note reviewed.  Constitutional:      General: He is in acute distress (Tachypneic).     Appearance: He is ill-appearing.     Comments: Able to speak in short sentences  HENT:     Head: Normocephalic.     Mouth/Throat:     Mouth: Mucous membranes are moist.     Pharynx: Oropharynx is clear. No oropharyngeal exudate or posterior oropharyngeal erythema.     Comments: (+) Full top and bottom dentures Pulmonary:     Comments: Tachypneic, decreased breath sound on right.  Abdominal:     General: There is no distension.     Palpations: Abdomen is soft.     Tenderness: There is no abdominal tenderness.  Skin:    General: Skin is warm and dry.     Comments: (+) wound right chest wall draining thick brown, foul smelling fluid. No active bleeding noted.   Neurological:     Mental Status: He is alert and oriented to person, place, and time.  Psychiatric:        Mood and Affect: Mood normal.        Behavior: Behavior normal.        Thought Content: Thought content normal.        Judgment: Judgment normal.      MD Evaluation Airway: WNL(Full dentures top and bottom) Heart: WNL Abdomen: WNL Chest/ Lungs: WNL ASA  Classification: 3 Mallampati/Airway Score: Two   Imaging: Dg Chest 2 View  Result Date: 10/25/2018 CLINICAL DATA:  Pneumothorax. EXAM: CHEST - 2  VIEW COMPARISON:  10/24/2018 FINDINGS: Moderate opacification over the right base likely moderate size effusion with associated atelectasis unchanged to slightly  worse. Previously noted tiny right apical pneumothorax not well visualized on the current study. Left lung is clear. Cardiomediastinal silhouette and remainder of the exam is unchanged. IMPRESSION: Opacification over the right mid to lower lung likely moderate effusion with associated atelectasis unchanged to slightly worse. No definite right upper pneumothorax visualized. Electronically Signed   By: Marin Olp M.D.   On: 10/25/2018 10:02   Dg Chest 2 View  Result Date: 10/16/2018 CLINICAL DATA:  Preop evaluation for upcoming VATS EXAM: CHEST - 2 VIEW COMPARISON:  09/18/2018 FINDINGS: Cardiac shadows within normal limits. The lungs are hyperinflated. Chronic changes in the right base are again seen and stable consistent with the given clinical history. Degenerative changes of the thoracic spine are noted. IMPRESSION: Chronic changes without acute abnormality. Electronically Signed   By: Inez Catalina M.D.   On: 10/16/2018 15:52   Ct Chest W Contrast  Result Date: 11/06/2018 CLINICAL DATA:  Worsening shortness of breath. 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 Elevated white blood cell count. EXAM: CT CHEST WITH CONTRAST TECHNIQUE: Multidetector CT imaging of the chest was performed during intravenous contrast administration. CONTRAST:  75 mL OMNIPAQUE IOHEXOL 300 MG/ML  SOLN COMPARISON:  Single-view of the chest 11/06/2018 and PA and lateral chest 10/25/2018. CT chest 07/26/2018. FINDINGS: Cardiovascular: No significant vascular findings. Normal heart size. No pericardial effusion. Extensive calcific aortic and coronary atherosclerosis noted. Mediastinum/Nodes: A precarinal lymph node measuring 1.8 cm on image 87 is increased in size from 0.6 cm on the prior CT and is likely reactive. The thyroid gland and esophagus appear normal. Lungs/Pleura: The patient has a right pleural effusion with multiple locules of air  present within the fluid collection and an air-fluid level anteriorly. The pleura of the right lower chest is somewhat hyperenhancing. Air-fluid collection in the lateral aspect of the right chest wall measures 4.2 cm AP x 1.6 cm transverse x 2.5 cm craniocaudal. There is volume loss in the right chest consistent with lobectomy. Lungs again demonstrate changes of emphysema. 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 Upper Abdomen: No acute finding.  Atrophy of the right kidney noted. Musculoskeletal: No acute or focal bony abnormality. IMPRESSION: Status post right lower lobectomy. Mild hyperenhancement of pleura in the right lower chest with a pleural fluid collection containing locules of gas could be postoperative but is worrisome for empyema given the patient's elevated white blood cell count. Small air and fluid collection in the right chest wall is likely related to prior chest tube but could be a small abscess. 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 Aortic Atherosclerosis (ICD10-I70.0) and Emphysema (ICD10-J43.9). Electronically Signed   By: Inge Rise M.D.   On: 11/06/2018 07:49   Dg Chest 1v Repeat Same Day  Result Date: 10/24/2018 CLINICAL DATA:  78 year old male status post chest tube removal. EXAM: CHEST - 1 VIEW SAME DAY COMPARISON:  Chest radiograph dated 10/24/2018 FINDINGS: There has been interval removal of the right-sided chest tube. No detectable pneumothorax. Right lung base surgical sutures and opacities similar to prior radiograph likely combination of atelectasis and probable small pleural effusion. Pneumonia is not excluded. Clinical correlation is recommended. The left lung remains clear. The cardiac silhouette is within normal limits. Right lateral chest wall soft tissue emphysema again  noted. IMPRESSION: Interval removal of the right-sided chest tube. No detectable pneumothorax.  Electronically Signed   By: Anner Crete M.D.   On: 10/24/2018 11:25   Dg Chest Port 1 View  Result Date: 11/06/2018 CLINICAL DATA:  Leaking fluid after V ats EXAM: PORTABLE CHEST 1 VIEW COMPARISON:  Twelve days ago FINDINGS: Postoperative volume loss and opacification on the right with hazy density attributed to pleural fluid. There is superimposed lucency over the right base likely reflecting a basilar pneumothorax. Right chest wall emphysema is diminished but unexpectedly persistent. Clear and hyperinflated left lung.  Normal heart size IMPRESSION: 1. Probable hydropneumothorax at the postoperative right base. 2. Right chest wall emphysema is decreased from 12 days ago but notably persistent. Electronically Signed   By: Monte Fantasia M.D.   On: 11/06/2018 06:14   Dg Chest Port 1 View  Result Date: 10/24/2018 CLINICAL DATA:  Pneumothorax. EXAM: PORTABLE CHEST 1 VIEW COMPARISON:  Radiographs of October 23, 2018. FINDINGS: Stable cardiomediastinal silhouette. Atherosclerosis of thoracic aorta is noted. Left lung is clear. Minimal right apical pneumothorax is again noted and stable. Stable subcutaneous emphysema is seen over right lateral chest wall. Right-sided chest tube is unchanged in position. Stable right basilar atelectasis is noted with probable associated pleural effusion. Bony thorax is unremarkable. IMPRESSION: Stable position of right-sided chest tube with minimal right apical pneumothorax. Stable right basilar atelectasis and probable effusion is noted. Stable subcutaneous emphysema seen over right lateral chest wall. Electronically Signed   By: Marijo Conception M.D.   On: 10/24/2018 10:21   Dg Chest Port 1 View  Result Date: 10/23/2018 CLINICAL DATA:  Prominent ectomy.  Chest tube. EXAM: PORTABLE CHEST 1 VIEW COMPARISON:  10/22/2018. FINDINGS: Right chest tube in stable position. Tiny right apical and right base pneumothorax cannot be excluded on today's exam. Postsurgical changes  right lung. Heart size normal. Right chest wall subcutaneous emphysema again noted. IMPRESSION: 1. Right chest tube in stable position. Tiny right ankle right base pneumothorax noted on today's exam. Right chest wall subcutaneous emphysema again noted. 2.  Postsurgical changes right lung again noted. Critical Value/emergent results were called by telephone at the time of interpretation on 10/23/2018 at 7:03 am to nurse Janett Billow, who verbally acknowledged these results. Electronically Signed   By: Marcello Moores  Register   On: 10/23/2018 07:05   Dg Chest Port 1 View  Result Date: 10/22/2018 CLINICAL DATA:  Postop thoracotomy. EXAM: PORTABLE CHEST 1 VIEW COMPARISON:  Radiographs earlier today and 10/21/2018. CT 07/26/2018. FINDINGS: 1253 hours. The more inferior of the right chest tubes appears to have been removed in the interval. There is increased soft tissue emphysema within the right lateral chest wall, but no definite pneumothorax is seen. There is stable volume loss in the right hemithorax with parenchymal opacity inferiorly. The left lung is clear. The heart size and mediastinal contours are stable. IMPRESSION: Interval removal of 1 of the right-sided chest tubes without evidence of complicating pneumothorax. Increased right lateral chest wall soft tissue emphysema. Electronically Signed   By: Richardean Sale M.D.   On: 10/22/2018 13:11   Dg Chest Port 1 View  Result Date: 10/22/2018 CLINICAL DATA:  78 year old male with a history of shortness of breath. Prior VATS and right lower lobectomy EXAM: PORTABLE CHEST 1 VIEW COMPARISON:  October 21, 2018, October 20, 2018 FINDINGS: Cardiomediastinal silhouette unchanged in size and contour. Unchanged thoracostomy tube. Surgical changes in the right hilar/infrahilar region. No visualized pneumothorax. Coarsened interstitial markings bilaterally. Opacity at the  right lung base obscuring the right hemidiaphragm is unchanged. No new left-sided airspace disease.  Degenerative changes of the shoulders. IMPRESSION: Similar appearance of postoperative chest x-ray with right thoracostomy tube at the lung base and no visualized pneumothorax. Opacity at the right lung base potentially combination of postoperative changes, small pleural fluid, and/or atelectasis. Electronically Signed   By: Corrie Mckusick D.O.   On: 10/22/2018 07:52   Dg Chest Port 1 View  Result Date: 10/21/2018 CLINICAL DATA:  78 year old male with history of pneumothorax. Status post right lower lobectomy on 10/18/2018. EXAM: PORTABLE CHEST 1 VIEW COMPARISON:  Chest x-ray 10/20/2018. FINDINGS: Left lung is well expanded and clear. Status post right lower lobectomy. Small basilar pneumothorax on the right side. Right-sided chest tubes are in position with tips terminating in the right mid and lower hemithorax, similar to yesterday's examination. Opacities in the right base likely reflect a combination of pleural fluid and areas of atelectasis in the remaining right upper and middle lobes. No evidence of pulmonary edema. Heart size is normal. Slight shift of cardiomediastinal structures to the right. Aortic atherosclerosis. Small amount of subcutaneous emphysema in the right chest wall, similar to the prior study. IMPRESSION: 1. Stable radiographic appearance the chest, as above. Electronically Signed   By: Vinnie Langton M.D.   On: 10/21/2018 07:16   Dg Chest Port 1 View  Result Date: 10/20/2018 CLINICAL DATA:  Evaluate chest tube EXAM: PORTABLE CHEST 1 VIEW COMPARISON:  Chest radiograph 10/20/2018 FINDINGS: Monitoring leads overlie the patient. Right chest tubes stable in position. Stable cardiac and mediastinal contours. Unchanged consolidation within the right lower hemithorax. Similar-appearing right basilar pneumothorax. Unchanged right chest wall emphysema. Similar interstitial opacities left lung. IMPRESSION: Stable right chest tubes with small right basilar pneumothorax. Similar consolidation  right lower hemithorax. Electronically Signed   By: Lovey Newcomer M.D.   On: 10/20/2018 13:48   Dg Chest Port 1 View  Result Date: 10/20/2018 CLINICAL DATA:  Pneumothorax follow-up EXAM: PORTABLE CHEST 1 VIEW COMPARISON:  Yesterday FINDINGS: Stable volume loss and opacity at the right base. 2 right-sided chest tubes in similar position with small basal pneumothorax and increased chest wall emphysema. Normal heart size. Interstitial coarsening on the left. IMPRESSION: 1. Stable chest tube positioning with small right basal pneumothorax. 2. Stable right base atelectasis. Electronically Signed   By: Monte Fantasia M.D.   On: 10/20/2018 08:16   Dg Chest Port 1 View  Result Date: 10/19/2018 CLINICAL DATA:  Chest tube present s/p VATS EXAM: PORTABLE CHEST 1 VIEW COMPARISON:  Radiograph 10/18/2018 FINDINGS: Two RIGHT chest tubes in place. No pneumothorax. Volume loss in the RIGHT hemithorax. Atelectasis at the RIGHT lung base. Mild interstitial thickening lungs bilateral period IMPRESSION: 1. Two RIGHT chest tube in place post procedure. Volume loss the RIGHT hemithorax. No pneumothorax. 2. No interval change Electronically Signed   By: Suzy Bouchard M.D.   On: 10/19/2018 08:48   Dg Chest Port 1 View  Result Date: 10/18/2018 CLINICAL DATA:  Pneumothorax EXAM: PORTABLE CHEST 1 VIEW COMPARISON:  10/16/2018 FINDINGS: Interval placement of right-sided chest tubes with dense atelectasis or consolidation of the right lower lung. No significant pneumothorax appreciated. Irregular scarring or atelectasis of the left lung base. The heart and mediastinum are unremarkable. IMPRESSION: Interval placement of right-sided chest tubes with dense atelectasis or consolidation of the right lower lung. No significant pneumothorax appreciated. Electronically Signed   By: Eddie Candle M.D.   On: 10/18/2018 15:56    Labs:  CBC:  Recent Labs    10/20/18 0301 10/22/18 0233 10/23/18 0258 11/06/18 0542  WBC 15.1* 16.7* 13.7*  29.3*  HGB 8.5* 7.7* 8.0* 7.5*  HCT 28.1* 25.1* 25.1* 24.9*  PLT 305 342 351 600*    COAGS: Recent Labs    07/24/18 2135 10/16/18 1123  INR 1.1 1.1  APTT 34 29    BMP: Recent Labs    10/19/18 0427 10/19/18 0428 10/20/18 0301 10/21/18 0820 11/06/18 0542  NA 141 143 140 139 141  K 4.5 4.5 4.1 3.8 3.4*  CL 111  --  108 108 103  CO2 23  --  23 25 23   GLUCOSE 83  --  95 125* 114*  BUN 20  --  20 20 20   CALCIUM 8.1*  --  8.2* 8.3* 8.5*  CREATININE 1.49*  --  1.46* 1.19 1.09  GFRNONAA 45*  --  46* 59* >60  GFRAA 52*  --  53* >60 >60    LIVER FUNCTION TESTS: Recent Labs    07/07/18 1151 07/24/18 1800 10/16/18 1123 10/20/18 0301  BILITOT 0.3 0.5 0.1* 0.4  AST 26 19 25 23   ALT 17 16 21 8   ALKPHOS 99 75 91 75  PROT 7.9 7.0 7.8 5.7*  ALBUMIN 2.8* 2.8* 3.2* 2.5*    TUMOR MARKERS: No results for input(s): AFPTM, CEA, CA199, CHROMGRNA in the last 8760 hours.  Assessment and Plan:  78 y/o M s/p VATS/right lower lobectomy 10/18/18 with Dr. Roxan Hockey who presented to [facility] ED today with worsening drainage from previous chest tube insertion site. CT chest with contrast obtained consistent with empyema in patient with leukocytosis. Request has been made to IR for right chest tube placement - patient has been reviewed by Dr. Kathlene Cote who approves procedure for today in CT.  Patient to remain NPO until post procedure - he reports last drank a few sips of water very early this morning, he believes it was around midnight. Lovenox until post procedure.  Afebrile, WBC 29.3, hgb 7.5, plt 600. He has been started on vancomycin + zosyn per primary team. IR will follow along with CT surgery after chest tube is placed.   Risks and benefits discussed with the patient including bleeding, infection, damage to adjacent structures, malfunction of the catheter with need for additional procedures.  All of the patient's questions were answered, patient is agreeable to proceed.   Consent signed  and in IR binder.  Thank you for this interesting consult.  I greatly enjoyed meeting William Kirk and look forward to participating in their care.  A copy of this report was sent to the requesting provider on this date.  Electronically Signed: Joaquim Nam, PA-C 11/06/2018, 9:30 AM   I spent a total of 20 Minutes  in face to face in clinical consultation, greater than 50% of which was counseling/coordinating care for right chest tube placement.

## 2018-11-06 NOTE — ED Notes (Signed)
Returned from CT.

## 2018-11-07 ENCOUNTER — Inpatient Hospital Stay (HOSPITAL_COMMUNITY): Payer: Medicare HMO

## 2018-11-07 LAB — BASIC METABOLIC PANEL
Anion gap: 7 (ref 5–15)
BUN: 21 mg/dL (ref 8–23)
CO2: 25 mmol/L (ref 22–32)
Calcium: 8 mg/dL — ABNORMAL LOW (ref 8.9–10.3)
Chloride: 107 mmol/L (ref 98–111)
Creatinine, Ser: 1.12 mg/dL (ref 0.61–1.24)
GFR calc Af Amer: >60 mL/min (ref >60)
GFR calc non Af Amer: >60 mL/min (ref >60)
Glucose, Bld: 90 mg/dL (ref 70–99)
Potassium: 3.8 mmol/L (ref 3.5–5.1)
Sodium: 139 mmol/L (ref 135–145)

## 2018-11-07 LAB — CBC
HCT: 22.7 % — ABNORMAL LOW (ref 39.0–52.0)
Hemoglobin: 6.6 g/dL — CL (ref 13.0–17.0)
MCH: 25.1 pg — ABNORMAL LOW (ref 26.0–34.0)
MCHC: 29.1 g/dL — ABNORMAL LOW (ref 30.0–36.0)
MCV: 86.3 fL (ref 80.0–100.0)
Platelets: 526 10*3/uL — ABNORMAL HIGH (ref 150–400)
RBC: 2.63 MIL/uL — ABNORMAL LOW (ref 4.22–5.81)
RDW: 17.2 % — ABNORMAL HIGH (ref 11.5–15.5)
WBC: 18.3 10*3/uL — ABNORMAL HIGH (ref 4.0–10.5)
nRBC: 0 % (ref 0.0–0.2)

## 2018-11-07 LAB — PREPARE RBC (CROSSMATCH)

## 2018-11-07 MED ORDER — SODIUM CHLORIDE 0.9% IV SOLUTION
Freq: Once | INTRAVENOUS | Status: AC
  Administered 2018-11-07: 03:00:00 via INTRAVENOUS

## 2018-11-07 NOTE — Progress Notes (Signed)
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 Wife stated that they have been there before admission and are due to his home oxygen use. Injuries left open to air. Gauze placed around oxygen tubing to provide pressure relief. Injuries placed in flowsheet.

## 2018-11-07 NOTE — Progress Notes (Signed)
      [address]       [place] [address]             [phone]      No complaints, feels better this evening BP (!) 110/51   Pulse 69   Temp 98 F (36.7 C) (Oral)   Resp 18   Ht 5\' 10"  (1.778 m)   Wt 67.5 kg   SpO2 93%   BMI 21.35 kg/m   Intake/Output Summary (Last 24 hours) at 11/07/2018 1822 Last data filed at 11/07/2018 1800 Gross per 24 hour  Intake 3552.59 ml  Output 1440 ml  Net 2112.59 ml   Continue current care Awaiting bed on progressive unit  Keyosha Tiedt C. Roxan Hockey, MD [facility] [phone]

## 2018-11-07 NOTE — Progress Notes (Addendum)
CRITICAL VALUE ALERT  Critical Value:   Hgb 6.6  Date & Time Notied:  [id number]  0330 Provider Notified:   Dr. Kipp Brood  Orders Received/Actions taken:  Transfuse 1 unit PRBC

## 2018-11-07 NOTE — Progress Notes (Signed)
Referring Physician(s): Dr. Roxan Hockey  Supervising Physician: Corrie Mckusick  Patient Status:  [facility] - In-pt  Chief Complaint: Follow up right chest tube placement.   Subjective:  Patient sitting up in bed, states he is feeling much better today - breathing has improved very much.   Allergies: Patient has no known allergies.  Medications: Prior to Admission medications   Medication Sig Start Date End Date Taking? Authorizing Provider  acetaminophen (TYLENOL) 500 MG tablet Take 500 mg by mouth 3 (three) times daily.   Yes [provider]  albuterol (PROVENTIL) (2.5 MG/3ML) 0.083% nebulizer solution Take 3 mLs (2.5 mg total) by nebulization 2 (two) times a day for 30 days. 07/11/18 11/06/18 Yes Hongalgi, Lenis Dickinson, MD  albuterol (VENTOLIN HFA) 108 (90 Base) MCG/ACT inhaler Inhale 2 puffs into the lungs every 4 (four) hours as needed for wheezing or shortness of breath. 07/11/18  Yes Hongalgi, Lenis Dickinson, MD  amiodarone (PACERONE) 200 MG tablet Take 1 tablet (200 mg total) by mouth 2 (two) times daily. For 5 days then take 200 mg daily thereafter Patient taking differently: Take 200 mg by mouth daily.  10/25/18  Yes Lars Pinks M, PA-C  amoxicillin-clavulanate (AUGMENTIN) 875-125 MG tablet Take 1 tablet by mouth 2 (two) times daily.   Yes [provider]  atorvastatin (LIPITOR) 20 MG tablet Take 20 mg by mouth at bedtime.   Yes [provider]  budesonide-formoterol (SYMBICORT) 160-4.5 MCG/ACT inhaler Inhale 2 puffs into the lungs 2 (two) times daily.   Yes [provider]  diclofenac sodium (VOLTAREN) 1 % GEL Apply 2 g topically 4 (four) times daily as needed (pain).    Yes [provider]  gabapentin (NEURONTIN) 300 MG capsule Take 300 mg by mouth at bedtime.    Yes [provider]  lidocaine (LIDODERM) 5 % Place 1 patch onto the skin daily as needed (pain).    Yes [provider]  metoprolol succinate (TOPROL-XL) 25 MG 24  hr tablet Take 0.5 tablets (12.5 mg total) by mouth daily. 10/26/18  Yes Lars Pinks M, PA-C  Multiple Vitamin (MULTIVITAMIN WITH MINERALS) TABS tablet Take 1 tablet by mouth daily.   Yes [provider]  OXYGEN Inhale 2-3 L into the lungs See admin instructions. Inhale 3 liters of oxygen during the day and 2 liters at bedtime   Yes [provider]  sertraline (ZOLOFT) 100 MG tablet Take 200 mg by mouth daily.    Yes [provider]  testosterone cypionate (DEPOTESTOTERONE CYPIONATE) 100 MG/ML injection Inject 200 mg into the muscle every 28 (twenty-eight) days.    Yes [provider]  Tiotropium Bromide Monohydrate (SPIRIVA RESPIMAT) 2.5 MCG/ACT AERS Inhale 2 puffs into the lungs daily.   Yes [provider]  traMADol (ULTRAM) 50 MG tablet Take 1 tablet (50 mg total) by mouth every 6 (six) hours as needed (mild pain). 10/25/18  Yes Lars Pinks M, PA-C  traZODone (DESYREL) 50 MG tablet Take 50 mg by mouth at bedtime.   Yes [provider]  vitamin B-12 (CYANOCOBALAMIN) 1000 MCG tablet Take 1,000 mcg by mouth daily.   Yes [provider]     Vital Signs: BP (!) 107/51    Pulse 66    Temp 97.8 F (36.6 C) (Oral)    Resp 20    Ht 5\' 10"  (1.778 m)    Wt 148 lb 13 oz (67.5 kg)    SpO2 92%    BMI 21.35 kg/m  Physical Exam Vitals signs and nursing note reviewed.  Constitutional:      General: He is not in acute distress.    Appearance: He is ill-appearing.  HENT:     Head: Normocephalic.  Cardiovascular:     Rate and Rhythm: Normal rate.  Pulmonary:     Effort: Pulmonary effort is normal.     Comments: Decreased breath sounds on the right Abdominal:     General: There is no distension.     Palpations: Abdomen is soft.     Tenderness: There is no abdominal tenderness.  Skin:    General: Skin is warm and dry.  Neurological:     Mental Status: He is alert. Mental status is at baseline.     Imaging: Ct Chest W  Contrast  Result Date: 11/06/2018 CLINICAL DATA:  Worsening shortness of breath. 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 Elevated white blood cell count. EXAM: CT CHEST WITH CONTRAST TECHNIQUE: Multidetector CT imaging of the chest was performed during intravenous contrast administration. CONTRAST:  75 mL OMNIPAQUE IOHEXOL 300 MG/ML  SOLN COMPARISON:  Single-view of the chest 11/06/2018 and PA and lateral chest 10/25/2018. CT chest 07/26/2018. FINDINGS: Cardiovascular: No significant vascular findings. Normal heart size. No pericardial effusion. Extensive calcific aortic and coronary atherosclerosis noted. Mediastinum/Nodes: A precarinal lymph node measuring 1.8 cm on image 87 is increased in size from 0.6 cm on the prior CT and is likely reactive. The thyroid gland and esophagus appear normal. Lungs/Pleura: The patient has a right pleural effusion with multiple locules of air present within the fluid collection and an air-fluid level anteriorly. The pleura of the right lower chest is somewhat hyperenhancing. Air-fluid collection in the lateral aspect of the right chest wall measures 4.2 cm AP x 1.6 cm transverse x 2.5 cm craniocaudal. There is volume loss in the right chest consistent with lobectomy. Lungs again demonstrate changes of emphysema. 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 Upper Abdomen: No acute finding.  Atrophy of the right kidney noted. Musculoskeletal: No acute or focal bony abnormality. IMPRESSION: Status post right lower lobectomy. Mild hyperenhancement of pleura in the right lower chest with a pleural fluid collection containing locules of gas could be postoperative but is worrisome for empyema given the patient's elevated white blood cell count. Small air and fluid collection in the right chest wall is likely related to prior chest tube but could be a small  abscess. 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 Aortic Atherosclerosis (ICD10-I70.0) and Emphysema (ICD10-J43.9). Electronically Signed   By: Inge Rise M.D.   On: 11/06/2018 07:49   Dg Chest Port 1 View  Result Date: 11/07/2018 CLINICAL DATA:  Chest tube, empyema EXAM: PORTABLE CHEST 1 VIEW COMPARISON:  11/06/2010 FINDINGS: Interval placement of right pleural drainage catheter. Loculated pneumothorax at the lateral right lung base. Right lower lobe airspace disease. Left lung clear. Heart is borderline in size. IMPRESSION: Interval placement of right pleural catheter with small loculated right basilar pneumothorax. Right lower lobe consolidation. Electronically Signed   By: Rolm Baptise M.D.   On: 11/07/2018 08:24   Dg Chest Port 1 View  Result Date: 11/06/2018 CLINICAL DATA:  Leaking fluid after V ats EXAM: PORTABLE CHEST 1 VIEW COMPARISON:  Twelve days ago FINDINGS: Postoperative volume loss and opacification on the right with hazy density attributed to pleural fluid. There is superimposed lucency over the right  base likely reflecting a basilar pneumothorax. Right chest wall emphysema is diminished but unexpectedly persistent. Clear and hyperinflated left lung.  Normal heart size IMPRESSION: 1. Probable hydropneumothorax at the postoperative right base. 2. Right chest wall emphysema is decreased from 12 days ago but notably persistent. Electronically Signed   By: Monte Fantasia M.D.   On: 11/06/2018 06:14   Ct Image Guided Drainage By Percutaneous Catheter  Result Date: 11/06/2018 CLINICAL DATA:  Status post right lower lobectomy on 10/18/2018 with drainage of fluid from chest wound and CT demonstrating loculated fluid and air in the right pleural space. EXAM: CT GUIDED RIGHT PLEURAL DRAINAGE CATHETER PLACEMENT ANESTHESIA/SEDATION: 2.0 mg IV Versed 100 mcg IV Fentanyl Total Moderate Sedation Time:  33 minutes The patient's level of  consciousness and physiologic status were continuously monitored during the procedure by Radiology nursing. PROCEDURE: The procedure, risks, benefits, and alternatives were explained to the patient. Questions regarding the procedure were encouraged and answered. The patient understands and consents to the procedure. A time out was performed prior to initiating the procedure. CT was performed in a supine position with the right side rolled up. The right lateral chest wall was prepped with chlorhexidine in a sterile fashion, and a sterile drape was applied covering the operative field. A sterile gown and sterile gloves were used for the procedure. Local anesthesia was provided with 1% Lidocaine. Under CT guidance, an 18 gauge trocar needle was advanced into the right lateral pleural space. Fluid was aspirated and a sample sent for culture analysis. A guidewire was advanced. The percutaneous tract was dilated. A 14 French pigtail drainage catheter was then advanced into the pleural space. Catheter position was confirmed by CT. The catheter was attached to a [employer] device. The catheter was secured at the skin with a Prolene retention suture and overlying dressing. COMPLICATIONS: None FINDINGS: Aspiration at the level of the pleural fluid collection yielded brown, turbid and foul-smelling liquid. After placement of the 14 French pigtail drainage catheter, there is good return fluid. The Pleur-evac device will be attached to wall suction. IMPRESSION: Placement of 67 French pigtail drainage catheter in the right pleural space to treat postoperative loculated pleural fluid collection. Foul-smelling brown fluid was aspirated and sent for culture analysis. The drainage catheter was attached to a Pleur-evac device which will be attached to wall suction at -20 cm of water. Electronically Signed   By: Aletta Edouard M.D.   On: 11/06/2018 18:00    Labs:  CBC: Recent Labs    10/22/18 0233 10/23/18 0258  11/06/18 0542 11/07/18 0249  WBC 16.7* 13.7* 29.3* 18.3*  HGB 7.7* 8.0* 7.5* 6.6*  HCT 25.1* 25.1* 24.9* 22.7*  PLT 342 351 600* 526*    COAGS: Recent Labs    07/24/18 2135 10/16/18 1123  INR 1.1 1.1  APTT 34 29    BMP: Recent Labs    10/20/18 0301 10/21/18 0820 11/06/18 0542 11/07/18 0249  NA 140 139 141 139  K 4.1 3.8 3.4* 3.8  CL 108 108 103 107  CO2 23 25 23 25   GLUCOSE 95 125* 114* 90  BUN 20 20 20 21   CALCIUM 8.2* 8.3* 8.5* 8.0*  CREATININE 1.46* 1.19 1.09 1.12  GFRNONAA 46* 59* >60 >60  GFRAA 53* >60 >60 >60    LIVER FUNCTION TESTS: Recent Labs    07/07/18 1151 07/24/18 1800 10/16/18 1123 10/20/18 0301  BILITOT 0.3 0.5 0.1* 0.4  AST 26 19 25 23   ALT 17 16 21  8  ALKPHOS 99 75 91 75  PROT 7.9 7.0 7.8 5.7*  ALBUMIN 2.8* 2.8* 3.2* 2.5*    Assessment and Plan:  78 y/o M s/p right chest tube placement yesterday in IR for empyema. Patient reports improved breathing today. Chest tube to suction with small air leak noted,  ~400 cc dark brown output in pleur-evac today on exam, insertion site unremarkable, no SQ emphysema on palpation.  CXR this AM shows small right loculated pneumothorax. Preliminary culture shows gram (-) rods, currently on Zosyn + Vancomycin. Afebrile, WBC 18.3, hgb 6.6 -- received 1 unit PRBCs this morning.   Continue current management - further plans for CT surgery. IR will continue to follow along.  Please call with questions or concerns.   Electronically Signed: Joaquim Nam, PA-C 11/07/2018, 11:10 AM   I spent a total of 15 Minutes at the the patient's bedside AND on the patient's hospital floor or unit, greater than 50% of which was counseling/coordinating care for right chest tube follow up.

## 2018-11-07 NOTE — Plan of Care (Signed)

## 2018-11-08 ENCOUNTER — Inpatient Hospital Stay (HOSPITAL_COMMUNITY): Payer: Medicare HMO

## 2018-11-08 DIAGNOSIS — J479 Bronchiectasis, uncomplicated: Secondary | ICD-10-CM

## 2018-11-08 LAB — CBC
HCT: 22.9 % — ABNORMAL LOW (ref 39.0–52.0)
Hemoglobin: 6.9 g/dL — CL (ref 13.0–17.0)
MCH: 26.3 pg (ref 26.0–34.0)
MCHC: 30.1 g/dL (ref 30.0–36.0)
MCV: 87.4 fL (ref 80.0–100.0)
Platelets: 462 10*3/uL — ABNORMAL HIGH (ref 150–400)
RBC: 2.62 MIL/uL — ABNORMAL LOW (ref 4.22–5.81)
RDW: 17.4 % — ABNORMAL HIGH (ref 11.5–15.5)
WBC: 16.3 10*3/uL — ABNORMAL HIGH (ref 4.0–10.5)
nRBC: 0 % (ref 0.0–0.2)

## 2018-11-08 LAB — BASIC METABOLIC PANEL
Anion gap: 6 (ref 5–15)
BUN: 17 mg/dL (ref 8–23)
CO2: 25 mmol/L (ref 22–32)
Calcium: 7.9 mg/dL — ABNORMAL LOW (ref 8.9–10.3)
Chloride: 110 mmol/L (ref 98–111)
Creatinine, Ser: 1.05 mg/dL (ref 0.61–1.24)
GFR calc Af Amer: >60 mL/min (ref >60)
GFR calc non Af Amer: >60 mL/min (ref >60)
Glucose, Bld: 103 mg/dL — ABNORMAL HIGH (ref 70–99)
Potassium: 3.8 mmol/L (ref 3.5–5.1)
Sodium: 141 mmol/L (ref 135–145)

## 2018-11-08 LAB — PROTIME-INR
INR: 1.4 — ABNORMAL HIGH (ref 0.8–1.2)
Prothrombin Time: 16.9 seconds — ABNORMAL HIGH (ref 11.4–15.2)

## 2018-11-08 LAB — PREPARE RBC (CROSSMATCH)

## 2018-11-08 MED ORDER — SODIUM CHLORIDE 0.9 % IV SOLN
1.0000 g | Freq: Three times a day (TID) | INTRAVENOUS | Status: DC
  Administered 2018-11-08 – 2018-11-22 (×40): 1 g via INTRAVENOUS
  Filled 2018-11-08 (×46): qty 1

## 2018-11-08 MED ORDER — SODIUM CHLORIDE 0.9% IV SOLUTION: Freq: Once | INTRAVENOUS | Status: DC

## 2018-11-08 MED ORDER — POTASSIUM CHLORIDE CRYS ER 20 MEQ PO TBCR
30.0000 meq | EXTENDED_RELEASE_TABLET | Freq: Once | ORAL | Status: AC
  Administered 2018-11-08: 30 meq via ORAL
  Filled 2018-11-08: qty 1

## 2018-11-08 NOTE — Progress Notes (Signed)
Referring Physician(s): Dr. Roxan Hockey  Supervising Physician: Markus Daft  Patient Status:  [facility] - In-[facility]  Chief Complaint: Empyema  Subjective:  Resting comfortably.  Taking several medicines.  Chest tube in place on the right.  No air leak observed today.  Allergies: Patient has no known allergies.  Medications: Prior to Admission medications   Medication Sig Start Date End Date Taking? Authorizing Provider  acetaminophen (TYLENOL) 500 MG tablet Take 500 mg by mouth 3 (three) times daily.   Yes [provider]  albuterol (PROVENTIL) (2.5 MG/3ML) 0.083% nebulizer solution Take 3 mLs (2.5 mg total) by nebulization 2 (two) times a day for 30 days. 07/11/18 11/06/18 Yes Hongalgi, Lenis Dickinson, MD  albuterol (VENTOLIN HFA) 108 (90 Base) MCG/ACT inhaler Inhale 2 puffs into the lungs every 4 (four) hours as needed for wheezing or shortness of breath. 07/11/18  Yes Hongalgi, Lenis Dickinson, MD  amiodarone (PACERONE) 200 MG tablet Take 1 tablet (200 mg total) by mouth 2 (two) times daily. For 5 days then take 200 mg daily thereafter Patient taking differently: Take 200 mg by mouth daily.  10/25/18  Yes Lars Pinks M, PA-C  amoxicillin-clavulanate (AUGMENTIN) 875-125 MG tablet Take 1 tablet by mouth 2 (two) times daily.   Yes [provider]  atorvastatin (LIPITOR) 20 MG tablet Take 20 mg by mouth at bedtime.   Yes [provider]  budesonide-formoterol (SYMBICORT) 160-4.5 MCG/ACT inhaler Inhale 2 puffs into the lungs 2 (two) times daily.   Yes [provider]  diclofenac sodium (VOLTAREN) 1 % GEL Apply 2 g topically 4 (four) times daily as needed (pain).    Yes [provider]  gabapentin (NEURONTIN) 300 MG capsule Take 300 mg by mouth at bedtime.    Yes [provider]  lidocaine (LIDODERM) 5 % Place 1 patch onto the skin daily as needed (pain).    Yes [provider]  metoprolol succinate (TOPROL-XL) 25 MG 24 hr tablet Take 0.5  tablets (12.5 mg total) by mouth daily. 10/26/18  Yes Lars Pinks M, PA-C  Multiple Vitamin (MULTIVITAMIN WITH MINERALS) TABS tablet Take 1 tablet by mouth daily.   Yes [provider]  OXYGEN Inhale 2-3 L into the lungs See admin instructions. Inhale 3 liters of oxygen during the day and 2 liters at bedtime   Yes [provider]  sertraline (ZOLOFT) 100 MG tablet Take 200 mg by mouth daily.    Yes [provider]  testosterone cypionate (DEPOTESTOTERONE CYPIONATE) 100 MG/ML injection Inject 200 mg into the muscle every 28 (twenty-eight) days.    Yes [provider]  Tiotropium Bromide Monohydrate (SPIRIVA RESPIMAT) 2.5 MCG/ACT AERS Inhale 2 puffs into the lungs daily.   Yes [provider]  traMADol (ULTRAM) 50 MG tablet Take 1 tablet (50 mg total) by mouth every 6 (six) hours as needed (mild pain). 10/25/18  Yes Lars Pinks M, PA-C  traZODone (DESYREL) 50 MG tablet Take 50 mg by mouth at bedtime.   Yes [provider]  vitamin B-12 (CYANOCOBALAMIN) 1000 MCG tablet Take 1,000 mcg by mouth daily.   Yes [provider]     Vital Signs: BP (!) 117/53    Pulse 65    Temp 97.6 F (36.4 C) (Oral)    Resp (!) 21    Ht 5\' 10"  (1.778 m)    Wt 143 lb 1.3 oz (64.9 kg)    SpO2 94%    BMI 20.53 kg/m   Physical Exam  NAD, alert Chest:  Chest tube in place.  Dark, purulent output.  950 mL documented output yesterday. No air leak observed today.   Imaging: Ct Chest W Contrast  Result Date: 11/06/2018 CLINICAL DATA:  Worsening shortness of breath. 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 Elevated white blood cell count. EXAM: CT CHEST WITH CONTRAST TECHNIQUE: Multidetector CT imaging of the chest was performed during intravenous contrast administration. CONTRAST:  75 mL OMNIPAQUE IOHEXOL 300 MG/ML  SOLN COMPARISON:  Single-view of the chest  11/06/2018 and PA and lateral chest 10/25/2018. CT chest 07/26/2018. FINDINGS: Cardiovascular: No significant vascular findings. Normal heart size. No pericardial effusion. Extensive calcific aortic and coronary atherosclerosis noted. Mediastinum/Nodes: A precarinal lymph node measuring 1.8 cm on image 87 is increased in size from 0.6 cm on the prior CT and is likely reactive. The thyroid gland and esophagus appear normal. Lungs/Pleura: The patient has a right pleural effusion with multiple locules of air present within the fluid collection and an air-fluid level anteriorly. The pleura of the right lower chest is somewhat hyperenhancing. Air-fluid collection in the lateral aspect of the right chest wall measures 4.2 cm AP x 1.6 cm transverse x 2.5 cm craniocaudal. There is volume loss in the right chest consistent with lobectomy. Lungs again demonstrate changes of emphysema. 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 Upper Abdomen: No acute finding.  Atrophy of the right kidney noted. Musculoskeletal: No acute or focal bony abnormality. IMPRESSION: Status post right lower lobectomy. Mild hyperenhancement of pleura in the right lower chest with a pleural fluid collection containing locules of gas could be postoperative but is worrisome for empyema given the patient's elevated white blood cell count. Small air and fluid collection in the right chest wall is likely related to prior chest tube but could be a small abscess. 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 Aortic Atherosclerosis (ICD10-I70.0) and Emphysema (ICD10-J43.9). Electronically Signed   By: Inge Rise M.D.   On: 11/06/2018 07:49   Dg Chest Port 1 View  Result Date: 11/08/2018 CLINICAL DATA:  Chest tube, empyema EXAM: PORTABLE CHEST 1 VIEW COMPARISON:  11/07/2018 FINDINGS: No significant change in AP portable examination with a right-sided pigtail  chest tube in position about the right lung base, small persistent, loculated right basilar hydropneumothorax, and extensive volume loss of the right hemithorax. The left lung is normally aerated. Heart and mediastinum are unremarkable. IMPRESSION: No significant change in AP portable examination with a right-sided pigtail chest tube in position about the right lung base, small persistent, loculated right basilar hydropneumothorax, and extensive volume loss of the right hemithorax. The left lung is normally aerated. Electronically Signed   By: Eddie Candle M.D.   On: 11/08/2018 09:12   Dg Chest Port 1 View  Result Date: 11/07/2018 CLINICAL DATA:  Chest tube, empyema EXAM: PORTABLE CHEST 1 VIEW COMPARISON:  11/06/2010 FINDINGS: Interval placement of right pleural drainage catheter. Loculated pneumothorax at the lateral right lung base. Right lower lobe airspace disease. Left lung clear. Heart is borderline in size. IMPRESSION: Interval placement of right pleural catheter with small loculated right basilar pneumothorax. Right lower lobe consolidation. Electronically Signed   By: Rolm Baptise M.D.   On: 11/07/2018 08:24   Dg Chest Port 1 View  Result Date: 11/06/2018 CLINICAL DATA:  Leaking fluid after V ats EXAM: PORTABLE CHEST 1 VIEW COMPARISON:  Twelve days ago FINDINGS:  Postoperative volume loss and opacification on the right with hazy density attributed to pleural fluid. There is superimposed lucency over the right base likely reflecting a basilar pneumothorax. Right chest wall emphysema is diminished but unexpectedly persistent. Clear and hyperinflated left lung.  Normal heart size IMPRESSION: 1. Probable hydropneumothorax at the postoperative right base. 2. Right chest wall emphysema is decreased from 12 days ago but notably persistent. Electronically Signed   By: Monte Fantasia M.D.   On: 11/06/2018 06:14   Ct Image Guided Drainage By Percutaneous Catheter  Result Date: 11/06/2018 CLINICAL DATA:   Status post right lower lobectomy on 10/18/2018 with drainage of fluid from chest wound and CT demonstrating loculated fluid and air in the right pleural space. EXAM: CT GUIDED RIGHT PLEURAL DRAINAGE CATHETER PLACEMENT ANESTHESIA/SEDATION: 2.0 mg IV Versed 100 mcg IV Fentanyl Total Moderate Sedation Time:  33 minutes The patient's level of consciousness and physiologic status were continuously monitored during the procedure by Radiology nursing. PROCEDURE: The procedure, risks, benefits, and alternatives were explained to the patient. Questions regarding the procedure were encouraged and answered. The patient understands and consents to the procedure. A time out was performed prior to initiating the procedure. CT was performed in a supine position with the right side rolled up. The right lateral chest wall was prepped with chlorhexidine in a sterile fashion, and a sterile drape was applied covering the operative field. A sterile gown and sterile gloves were used for the procedure. Local anesthesia was provided with 1% Lidocaine. Under CT guidance, an 18 gauge trocar needle was advanced into the right lateral pleural space. Fluid was aspirated and a sample sent for culture analysis. A guidewire was advanced. The percutaneous tract was dilated. A 14 French pigtail drainage catheter was then advanced into the pleural space. Catheter position was confirmed by CT. The catheter was attached to a [employer] device. The catheter was secured at the skin with a Prolene retention suture and overlying dressing. COMPLICATIONS: None FINDINGS: Aspiration at the level of the pleural fluid collection yielded brown, turbid and foul-smelling liquid. After placement of the 14 French pigtail drainage catheter, there is good return fluid. The Pleur-evac device will be attached to wall suction. IMPRESSION: Placement of 97 French pigtail drainage catheter in the right pleural space to treat postoperative loculated pleural fluid  collection. Foul-smelling brown fluid was aspirated and sent for culture analysis. The drainage catheter was attached to a Pleur-evac device which will be attached to wall suction at -20 cm of water. Electronically Signed   By: Aletta Edouard M.D.   On: 11/06/2018 18:00    Labs:  CBC: Recent Labs    10/23/18 0258 11/06/18 0542 11/07/18 0249 11/08/18 0210  WBC 13.7* 29.3* 18.3* 16.3*  HGB 8.0* 7.5* 6.6* 6.9*  HCT 25.1* 24.9* 22.7* 22.9*  PLT 351 600* 526* 462*    COAGS: Recent Labs    07/24/18 2135 10/16/18 1123  INR 1.1 1.1  APTT 34 29    BMP: Recent Labs    10/21/18 0820 11/06/18 0542 11/07/18 0249 11/08/18 0210  NA 139 141 139 141  K 3.8 3.4* 3.8 3.8  CL 108 103 107 110  CO2 25 23 25 25   GLUCOSE 125* 114* 90 103*  BUN 20 20 21 17   CALCIUM 8.3* 8.5* 8.0* 7.9*  CREATININE 1.19 1.09 1.12 1.05  GFRNONAA 59* >60 >60 >60  GFRAA >60 >60 >60 >60    LIVER FUNCTION TESTS: Recent Labs    07/07/18 1151 07/24/18 1800  10/16/18 1123 10/20/18 0301  BILITOT 0.3 0.5 0.1* 0.4  AST 26 19 25 23   ALT 17 16 21 8   ALKPHOS 99 75 91 75  PROT 7.9 7.0 7.8 5.7*  ALBUMIN 2.8* 2.8* 3.2* 2.5*    Assessment and Plan: Empyema s/p chest tube placement 9/29 by Dr. Kathlene Cote Chest tube remains in place.  Patient states he is feeling better.  Output dark, bloody, purulent. Culture positive for Klebsiella. No air leak.   Continue current interventions. Plans per TCTS.  IR following.   Electronically Signed: Docia Barrier, PA 11/08/2018, 1:16 PM   I spent a total of 15 Minutes at the the patient's bedside AND on the patient's hospital floor or unit, greater than 50% of which was counseling/coordinating care for empyema.

## 2018-11-08 NOTE — TOC Initial Note (Signed)
Transition of Care [facility]) - Initial/Assessment Note    Patient Details  Name: William Kirk MRN: [id number] Date of Birth: 1941/02/03  [facility]) CM/SW Contact:    [name], RN Phone Number: 11/08/2018, 11:22 AM  Clinical Narrative:                 From home with wife, he uses cane at home. He goes to [place] to get his medications.  He will have transportation at dc.  He has home oxygen with [employer] 3-4 liters, wife to bring oxygen tank at dc. [facility] , goes to [place] [facility], Dr. Randel Books is PCP, CSW is  Dory Horn [phone], and [phone] ext [phone]. He is here with empyema, s/p VATS, chest tube to suction.  He will need to go back to OR tomorrow for infection clean up.  TOC team will continue to follow for dc needs. Will need [facility] .   Expected Discharge Plan: Home/Self Care Barriers to Discharge: No Barriers Identified   Patient Goals and CMS Choice Patient states their goals for this hospitalization and ongoing recovery are:: get better   Choice offered to / list presented to : NA  Expected Discharge Plan and Services Expected Discharge Plan: Home/Self Care In-house Referral: NA Discharge Planning Services: CM Consult Post Acute Care Choice: NA Living arrangements for the past 2 months: Single Family Home                           HH Arranged: NA          Prior Living Arrangements/Services Living arrangements for the past 2 months: Single Family Home Lives with:: Spouse Patient language and need for interpreter reviewed:: Yes Do you feel safe going back to the place where you live?: Yes      Need for Family Participation in Patient Care: Yes (Comment) Care giver support system in place?: Yes (comment) Current home services: DME(uses a cane) Criminal Activity/Legal Involvement Pertinent to Current Situation/Hospitalization: No - Comment as needed  Activities of  Daily Living Home Assistive Devices/Equipment: Shower chair with back, Environmental consultant (specify type), Dentures (specify type) ADL Screening (condition at time of admission) Patient's cognitive ability adequate to safely complete daily activities?: Yes Is the patient deaf or have difficulty hearing?: No Does the patient have difficulty seeing, even when wearing glasses/contacts?: No Does the patient have difficulty concentrating, remembering, or making decisions?: No Patient able to express need for assistance with ADLs?: No Does the patient have difficulty dressing or bathing?: No Independently performs ADLs?: Yes (appropriate for developmental age) Does the patient have difficulty walking or climbing stairs?: Yes Weakness of Legs: Both Weakness of Arms/Hands: None  Permission Sought/Granted                  Emotional Assessment Appearance:: Appears stated age Attitude/Demeanor/Rapport: Gracious Affect (typically observed): Appropriate Orientation: : Oriented to Self, Oriented to Place, Oriented to  Time, Oriented to Situation Alcohol / Substance Use: Not Applicable Psych Involvement: No (comment)  Admission diagnosis:  Empyema of right pleural space (HCC) [J86.9] Empyema (HCC) [J86.9] Patient Active Problem List   Diagnosis Date Noted  . Empyema (HCC) 11/06/2018  . Pressure injury of skin 11/06/2018  . 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 Hemoptysis 07/07/2018  . Acute renal failure (ARF) (HCC) 07/07/2018  . Acute respiratory failure (HCC) 07/07/2018  .  Chronic obstructive pulmonary disease (HCC) 11/07/2016  . Chronic respiratory failure with hypoxia (HCC) 11/07/2016  . Pulmonary infiltrates 11/07/2016  . Swallowing dysfunction 09/26/2016  . Depression 09/21/2016  . Aspiration pneumonia (HCC) 09/21/2016  . HTN (hypertension) 09/21/2016  . HCAP (healthcare-associated pneumonia)   . COPD with acute exacerbation (HCC) 06/21/2016  . Acute respiratory failure with hypoxia  (HCC) 06/21/2016  . Hypokalemia 06/21/2016  . Hyponatremia 06/21/2016  . Thrombocytosis (HCC) 06/21/2016  . PVD (peripheral vascular disease) (HCC) 06/21/2016  . Bronchiectasis (HCC) 06/21/2016  . Protein-calorie malnutrition, severe (HCC) 06/21/2016  . Multifocal pneumonia 06/20/2016  . FUO (fever of unknown origin) 04/15/2015  . 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 Other tear of medial meniscus, current injury, unspecified knee, initial encounter 12/03/2012   PCP:  Clinic, [facility] Pharmacy:   [facility] - [place], [place] - [address] [phone] [facility] [address] [facility] [place] [address] Phone: [phone] Fax: [phone]  [id number] - [place], [place] - [address] [place] [place] [address] Phone: [phone] Fax: [phone]     Social Determinants of Health (SDOH) Interventions    Readmission Risk Interventions Readmission Risk Prevention Plan 11/08/2018 10/23/2018  Transportation Screening Complete Complete  PCP or Specialist Appt within 3-5 Days Complete Complete  HRI or Home Care Consult Complete Complete  Social Work Consult for Recovery Care Planning/Counseling Complete Complete  Palliative Care Screening Not Applicable Not Applicable  Medication Review Oceanographer) Complete Complete  Some recent data might be hidden

## 2018-11-08 NOTE — Plan of Care (Signed)

## 2018-11-08 NOTE — Anesthesia Preprocedure Evaluation (Addendum)
Anesthesia Evaluation  Patient identified by MRN, date of birth, ID band Patient awake    Reviewed: Allergy & Precautions, NPO status , Patient's Chart, lab work & pertinent test results, reviewed documented beta blocker date and time   History of Anesthesia Complications Negative for: history of anesthetic complications  Airway Mallampati: I  TM Distance: >3 FB Neck ROM: Full    Dental  (+) Edentulous Upper, Edentulous Lower   Pulmonary COPD,  COPD inhaler and oxygen dependent, former smoker,  11/06/2018 SARS coronavirus NEG empyema   breath sounds clear to auscultation       Cardiovascular hypertension, Pt. on medications and Pt. on home beta blockers (-) angina+ Peripheral Vascular Disease  + dysrhythmias Atrial Fibrillation  Rhythm:Irregular Rate:Normal  '18 ECHO: EF 55-60%, mild MR   Neuro/Psych negative neurological ROS     GI/Hepatic negative GI ROS, Neg liver ROS,   Endo/Other  negative endocrine ROS  Renal/GU Renal InsufficiencyRenal disease     Musculoskeletal  (+) Arthritis , Osteoarthritis,    Abdominal   Peds  Hematology  (+) Blood dyscrasia (Hb 6.9), anemia ,   Anesthesia Other Findings   Reproductive/Obstetrics                            Anesthesia Physical Anesthesia Plan  ASA: III  Anesthesia Plan: General   Post-op Pain Management:    Induction: Intravenous  PONV Risk Score and Plan: 2 and Ondansetron and Dexamethasone  Airway Management Planned: Double Lumen EBT  Additional Equipment: Arterial line, Ultrasound Guidance Line Placement and CVP  Intra-op Plan:   Post-operative Plan: Extubation in OR  Informed Consent: I have reviewed the patients History and Physical, chart, labs and discussed the procedure including the risks, benefits and alternatives for the proposed anesthesia with the patient or authorized representative who has indicated his/her  understanding and acceptance.       Plan Discussed with: CRNA and Surgeon  Anesthesia Plan Comments:         Anesthesia Quick Evaluation

## 2018-11-08 NOTE — TOC Progression Note (Addendum)
Transition of Care [facility]) - Progression Note    Patient Details  Name: William Kirk MRN: [id number] Date of Birth: 07/15/1940  Transition of Care [facility]) CM/SW Contact  Zenon Mayo, RN Phone Number: 11/08/2018, 10:22 AM  Clinical Narrative:    [facility] , goes to [place] [facility], Dr. Patrina Levering is PCP, CSW is  Kellie Simmering [phone], and [phone] [place]. He is here with empyema, s/p VATS, chest tube to suction.  He will need to go back to OR tomorrow for infection clean up.  TOC team will continue to follow for dc needs. Will need to fax dc summary to [place] PCP .        Expected Discharge Plan and Services                                                 Social Determinants of Health (SDOH) Interventions    Readmission Risk Interventions Readmission Risk Prevention Plan 10/23/2018  Transportation Screening Complete  PCP or Specialist Appt within 3-5 Days Complete  HRI or [place] Complete  Social Work Consult for [place] Planning/Counseling Complete  Palliative Care Screening Not Applicable  Medication Review Press photographer) Complete  Some recent data might be hidden

## 2018-11-08 NOTE — Plan of Care (Signed)
  Problem: Education: Goal: Knowledge of General Education information will improve Description: Including pain rating scale, medication(s)/side effects and non-pharmacologic comfort measures 11/08/2018 1309 by Shanon Ace, RN Outcome: Progressing 11/08/2018 1309 by Shanon Ace, RN Outcome: Progressing   Problem: Health Behavior/Discharge Planning: Goal: Ability to manage health-related needs will improve 11/08/2018 1309 by Shanon Ace, RN Outcome: Progressing 11/08/2018 1309 by Shanon Ace, RN Outcome: Progressing   Problem: Clinical Measurements: Goal: Ability to maintain clinical measurements within normal limits will improve 11/08/2018 1309 by Shanon Ace, RN Outcome: Progressing 11/08/2018 1309 by Shanon Ace, RN Outcome: Progressing Goal: Will remain free from infection 11/08/2018 1309 by Shanon Ace, RN Outcome: Progressing 11/08/2018 1309 by Shanon Ace, RN Outcome: Progressing Goal: Diagnostic test results will improve 11/08/2018 1309 by Shanon Ace, RN Outcome: Progressing 11/08/2018 1309 by Shanon Ace, RN Outcome: Progressing Goal: Respiratory complications will improve 11/08/2018 1309 by Shanon Ace, RN Outcome: Progressing 11/08/2018 1309 by Shanon Ace, RN Outcome: Progressing Goal: Cardiovascular complication will be avoided 11/08/2018 1309 by Shanon Ace, RN Outcome: Progressing 11/08/2018 1309 by Shanon Ace, RN Outcome: Progressing   Problem: Activity: Goal: Risk for activity intolerance will decrease 11/08/2018 1309 by Shanon Ace, RN Outcome: Progressing 11/08/2018 1309 by Shanon Ace, RN Outcome: Progressing   Problem: Nutrition: Goal: Adequate nutrition will be maintained 11/08/2018 1309 by Shanon Ace, RN Outcome: Progressing 11/08/2018 1309 by Shanon Ace, RN Outcome: Progressing   Problem: Coping: Goal: Level of anxiety will decrease 11/08/2018 1309 by Shanon Ace, RN Outcome: Progressing 11/08/2018 1309 by Shanon Ace, RN Outcome: Progressing   Problem: Elimination: Goal: Will not experience complications related to bowel motility 11/08/2018 1309 by Shanon Ace, RN Outcome: Progressing 11/08/2018 1309 by Shanon Ace, RN Outcome: Progressing Goal: Will not experience complications related to urinary retention 11/08/2018 1309 by Shanon Ace, RN Outcome: Progressing 11/08/2018 1309 by Shanon Ace, RN Outcome: Progressing   Problem: Pain Managment: Goal: General experience of comfort will improve 11/08/2018 1309 by Shanon Ace, RN Outcome: Progressing 11/08/2018 1309 by Shanon Ace, RN Outcome: Progressing   Problem: Safety: Goal: Ability to remain free from injury will improve 11/08/2018 1309 by Shanon Ace, RN Outcome: Progressing 11/08/2018 1309 by Shanon Ace, RN Outcome: Progressing   Problem: Skin Integrity: Goal: Risk for impaired skin integrity will decrease 11/08/2018 1309 by Shanon Ace, RN Outcome: Progressing 11/08/2018 1309 by Shanon Ace, RN Outcome: Progressing

## 2018-11-08 NOTE — Progress Notes (Addendum)
      [address]       [place] [address]             [phone]           Subjective: Patient eating breakfast this am. He has no specific complaints.  Objective: Vital signs in last 24 hours: Temp:  [97.3 F (36.3 C)-98 F (36.7 C)] 98 F (36.7 C) (10/01 0404) Pulse Rate:  [50-77] 61 (10/01 0404) Cardiac Rhythm: Normal sinus rhythm (10/01 0403) Resp:  [18-30] 20 (10/01 0404) BP: (104-115)/(43-53) 110/50 (10/01 0404) SpO2:  [91 %-100 %] 96 % (10/01 0404) Weight:  [64.9 kg] 64.9 kg (10/01 0404)     Intake/Output from previous day: 09/30 0701 - 10/01 0700 In: 3257.2 [P.O.:265; I.V.:2194.9; Blood:281; IV Piggyback:516.3] Out: [id number] [Urine:950; Chest Tube:390]   Physical Exam:  Cardiovascular: RRR Pulmonary: Clear to auscultation on left and diminished right base. Abdomen: Soft, non tender, bowel sounds present. Extremities: No lower extremity edema. Wounds: There is some brownish drainage from wound Chest Tube: to suction, no air leak  Lab Results: CBC: Recent Labs    11/07/18 0249 11/08/18 0210  WBC 18.3* 16.3*  HGB 6.6* 6.9*  HCT 22.7* 22.9*  PLT 526* 462*   BMET:  Recent Labs    11/07/18 0249 11/08/18 0210  NA 139 141  K 3.8 3.8  CL 107 110  CO2 25 25  GLUCOSE 90 103*  BUN 21 17  CREATININE 1.12 1.05  CALCIUM 8.0* 7.9*    PT/INR: No results for input(s): LABPROT, INR in the last 72 hours. ABG:  INR: Will add last result for INR, ABG once components are confirmed Will add last 4 CBG results once components are confirmed  Assessment/Plan:  1. CV - SR in the 70's. On Amiodarone 200 mg daily and Toprol XL 12.5 mg daily. 2.  Pulmonary - History of COPD. Continue Incruse Ellipta. On 4 liters of oxygen via [place]. Pigtail chest tube with 390 cc last 24 hours. Chest tube is to suction and no air leak. CXR this am appears stable (RLL consolidation). 3. ID-WBC decreased to 16,300. On Zosyn and Vancomycin. Culture of right pleural  fluid showed moderate Klebsiella Pneumoniae and moderate Streptococcus Constellatus-await sensitivities 4. Anemia-H and H this am 6.9 and 22.9. Will transfuse 5. Supplement potassium  Donielle M ZimmermanPA-C 11/08/2018,7:10 AM [phone] Patient seen and examined, agree with above Moderate CT output- purulent in nature CXR shows incomplete reexpansion of lower lobe  Remo Lipps C. Roxan Hockey, MD [facility] [phone]

## 2018-11-08 NOTE — Discharge Summary (Signed)
Physician Discharge Summary  Patient ID: William Kirk MRN: [id number] DOB/AGE: January 22, 1941 78 y.o.  Admit date: 11/06/2018 Discharge date: 11/22/2018  Admission Diagnoses:  Patient Active Problem List   Diagnosis Date Noted  . Empyema (HCC) 11/06/2018  . Pressure injury of skin 11/06/2018  . 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 Hemoptysis 07/07/2018  . Acute renal failure (ARF) (HCC) 07/07/2018  . Acute respiratory failure (HCC) 07/07/2018  . Chronic obstructive pulmonary disease (HCC) 11/07/2016  . Chronic respiratory failure with hypoxia (HCC) 11/07/2016  . Pulmonary infiltrates 11/07/2016  . Swallowing dysfunction 09/26/2016  . Depression 09/21/2016  . Aspiration pneumonia (HCC) 09/21/2016  . HTN (hypertension) 09/21/2016  . HCAP (healthcare-associated pneumonia)   . COPD with acute exacerbation (HCC) 06/21/2016  . Acute respiratory failure with hypoxia (HCC) 06/21/2016  . Hypokalemia 06/21/2016  . Hyponatremia 06/21/2016  . Thrombocytosis (HCC) 06/21/2016  . PVD (peripheral vascular disease) (HCC) 06/21/2016  . Bronchiectasis (HCC) 06/21/2016  . Protein-calorie malnutrition, severe (HCC) 06/21/2016  . Multifocal pneumonia 06/20/2016  . FUO (fever of unknown origin) 04/15/2015  . 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 Other tear of medial meniscus, current injury, unspecified knee, initial encounter 12/03/2012   Discharge Diagnoses:   Patient Active Problem List   Diagnosis Date Noted  . Empyema, right (HCC) 11/09/2018  . Empyema (HCC) 11/06/2018  . Pressure injury of skin 11/06/2018  . 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 Hemoptysis 07/07/2018  . Acute renal failure (ARF) (HCC) 07/07/2018  . Acute respiratory failure (HCC) 07/07/2018  . Chronic obstructive pulmonary disease (HCC) 11/07/2016  . Chronic respiratory failure with hypoxia (HCC) 11/07/2016   . Pulmonary infiltrates 11/07/2016  . Swallowing dysfunction 09/26/2016  . Depression 09/21/2016  . Aspiration pneumonia (HCC) 09/21/2016  . HTN (hypertension) 09/21/2016  . HCAP (healthcare-associated pneumonia)   . COPD with acute exacerbation (HCC) 06/21/2016  . Acute respiratory failure with hypoxia (HCC) 06/21/2016  . Hypokalemia 06/21/2016  . Hyponatremia 06/21/2016  . Thrombocytosis (HCC) 06/21/2016  . PVD (peripheral vascular disease) (HCC) 06/21/2016  . Bronchiectasis (HCC) 06/21/2016  . Protein-calorie malnutrition, severe (HCC) 06/21/2016  . Multifocal pneumonia 06/20/2016  . FUO (fever of unknown origin) 04/15/2015  . 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 Other tear of medial meniscus, current injury, unspecified knee, initial encounter 12/03/2012   Discharged Condition: good  History of Present Illness:  William Kirk is a 78 year old gentleman with a history of tobacco abuse,COPD, bronchiectasis, recurrent pneumonia, recurrent hemoptysis, peripheral arterial disease, gastroesophageal reflux, and depression.  He is well known to [facility], as he recently underwent Right VATS with lysis of adhesions, thoracoscopic assisted Right lower lobectomy and intercostal nerve block on 10/18/2018 by Dr. Dorris Fetch.  Culture obtained from that surgery revealed Klebsiella pneumonia to be present.  However patient remained afebrile and had no evidence of leukocytosis and he was not discharged home on antibiotics.  Also during that hospitalization the patient developed post operative Atrial Fibrillation.  He was treated with oral amiodarone and lopressor.  The patient reported to the ED on 11/06/2018 with complaints of brownish fluid from his chest tube site.  He stated this had been present since his chest tube was removed on 9/16, but the amount of drainage had significantly increased today.  He did  not have any pain.  He is on oxygen chronically and EMS  noted a decreased oxygen sat of 89% during transportation.  CT scan of the chest was recommended by Dr. Vickey Sages.  This showed Mild hyperenhancement of pleura in the right lower chest with a pleural fluid collection containing locules of gas, concerning for empyema.  He had leukocytosis of 29.3  Dr. Dorris Fetch evaluated the patient and felt he should be admitted for antibiotics and chest tube placement by IR for empyema.  Hospital Course:   The patient was admitted and started on Vancomycin and Zosyn.  Intervention radiology consult was obtained.  They placed a pigtail catheter under CT guidance.  There was return of foul smelling, brown turbid fluid.  Cultures were sent and showed Klebsiella pneumoniae and streptococcus constellatus.  The patient's leukocytosis improved with antibiotic coverage (Vancomycin and Meropenem).  He was taken to the OR on 10/02 for bronchoscopy, redo right VATS, drain empyema, and decortication. He maintained NSR during his hospital stay.  He developed a drop in hemoglobin to 6.9 and was transfused a unit of packed blood cells. Follow up H/H was 11.5/36.2.  He will plan to go home with a mini express.  Follow up CXR showed unchanged right basal hydropneumothorax.  He remains afebrile.  His surgical wound incision has no evidence of infection.  Based on sensitivities, he has been transitioned to [facility] and will need to complete a 2 week course. He will need at home tube feeds since he remains NPO. He is ambulating independently.  Home health nursing, PT/OT, and SLP has been arranged. He is medically stable for discharge home today.   Consults: IR  Significant Diagnostic Studies:   Status post right lower lobectomy. Mild hyperenhancement of pleura in the right lower chest with a pleural fluid collection containing locules of gas could be postoperative but is worrisome for empyema given the patient's elevated white blood  cell count.  Small air and fluid collection in the right chest wall is likely related to prior chest tube but could be a small abscess.  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  Aortic Atherosclerosis (ICD10-I70.0) and Emphysema (ICD10-J43.9).  Treatments: IR Chest tube placement, ABX  Discharge Exam: Blood pressure (!) 122/47, pulse (!) 58, temperature 97.6 F (36.4 C), temperature source Oral, resp. rate 18, height 5\' 11"  (1.803 m), weight 59.1 kg, SpO2 95 %.   General appearance: alert, cooperative and no distress Heart: regular rate and rhythm, S1, S2 normal, no murmur, click, rub or gallop Lungs: clear to auscultation bilaterally Abdomen: soft, non-tender; bowel sounds normal; no masses,  no organomegaly Extremities: extremities normal, atraumatic, no cyanosis or edema Wound: clean and dry  Discharge Medications: Discharge Instructions    Home infusion instructions [facility] May follow [facility] Dosing Protocol; May administer Cathflo as needed to maintain patency of vascular access device.; Flushing of vascular access device: per [facility] Protocol: 0.9% NaCl pre/post medica...   Complete by: As directed    Instructions: May follow [facility] Dosing Protocol   Instructions: May administer Cathflo as needed to maintain patency of vascular access device.   Instructions: Flushing of vascular access device: per [facility] Protocol: 0.9% NaCl pre/post medication administration and prn patency; Heparin 100 u/ml, 5ml for implanted ports and Heparin 10u/ml, 5ml for all other central venous catheters.   Instructions: May follow [facility] Anaphylaxis Protocol for First Dose Administration in the home: 0.9% NaCl at 25-50 ml/hr to maintain IV access for protocol meds. Epinephrine 0.3 ml IV/IM PRN and Benadryl 25-50 IV/IM PRN s/s  of anaphylaxis.   Instructions: [facility] Infusion Coordinator (RN) to assist per patient IV care  needs in the home PRN.   Home infusion instructions [facility] May follow [facility] Dosing Protocol; May administer Cathflo as needed to maintain patency of vascular access device.; Flushing of vascular access device: per [facility] Protocol: 0.9% NaCl pre/post medica...   Complete by: As directed    Instructions: May follow [facility] Pharm[facility] Dosing Protocol   Instructions: May administer Cathflo as needed to maintain patency of vascular access device.   Instructions: Flushing of vascular access device: per [facility] Protocol: 0.9% NaCl pre/post medication administration and prn patency; Heparin 100 u/ml, 5ml for implanted ports and Heparin 10u/ml, 5ml for all other central venous catheters.   Instructions: May follow [facility] Anaphylaxis Protocol for First Dose Administration in the home: 0.9% NaCl at 25-50 ml/hr to maintain IV access for protocol meds. Epinephrine 0.3 ml IV/IM PRN and Benadryl 25-50 IV/IM PRN s/s of anaphylaxis.   Instructions: [facility] Infusion Coordinator (RN) to assist per patient IV care needs in the home PRN.     Allergies as of 11/22/2018   No Known Allergies     Medication List    STOP taking these medications   acetaminophen 500 MG tablet Commonly known as: TYLENOL   amiodarone 200 MG tablet Commonly known as: PACERONE   amoxicillin-clavulanate 875-125 MG tablet Commonly known as: AUGMENTIN   gabapentin 300 MG capsule Commonly known as: NEURONTIN Replaced by: gabapentin 250 MG/5ML solution   metoprolol succinate 25 MG 24 hr tablet Commonly known as: TOPROL-XL   multivitamin with minerals Tabs tablet   OXYGEN   testosterone cypionate 100 MG/ML injection Commonly known as: DEPOTESTOTERONE CYPIONATE   traMADol 50 MG tablet Commonly known as: ULTRAM     TAKE these medications   albuterol 108 (90 Base) MCG/ACT inhaler Commonly known as: VENTOLIN HFA Inhale 2 puffs into the lungs every 4 (four) hours as needed for wheezing or shortness of  breath. What changed: Another medication with the same name was removed. Continue taking this medication, and follow the directions you see here.   atorvastatin 20 MG tablet Commonly known as: LIPITOR Place 1 tablet (20 mg total) into feeding tube at bedtime. What changed: how to take this   budesonide-formoterol 160-4.5 MCG/ACT inhaler Commonly known as: SYMBICORT Inhale 2 puffs into the lungs 2 (two) times daily.   cyanocobalamin 1000 MCG tablet Place 1 tablet (1,000 mcg total) into feeding tube daily. Start taking on: November 23, 2018 What changed: how to take this   diclofenac sodium 1 % Gel Commonly known as: VOLTAREN Apply 2 g topically 4 (four) times daily as needed (pain).   docusate 50 MG/5ML liquid Commonly known as: COLACE Place 10 mLs (100 mg total) into feeding tube at bedtime.   ertapenem  IVPB Commonly known as: INVANZ Inject 1 g into the vein daily for 14 days. Indication:  Empyema Last Day of Therapy:  12/06/2018 Labs - Once weekly:  CBC/D and BMP, Labs - Every other week:  ESR and CRP   feeding supplement (OSMOLITE 1.5 CAL) Liqd Place 1,000 mLs into feeding tube continuous.   feeding supplement (PRO-STAT SUGAR FREE 64) Liqd Place 30 mLs into feeding tube 2 (two) times daily.   folic acid 1 MG tablet Commonly known as: FOLVITE Place 1 tablet (1 mg total) into feeding tube daily. Start taking on: November 23, 2018   free water Soln Place 200 mLs into feeding tube  4 (four) times daily.   gabapentin 250 MG/5ML solution Commonly known as: NEURONTIN Place 6 mLs (300 mg total) into feeding tube at bedtime. Replaces: gabapentin 300 MG capsule   levalbuterol 0.63 MG/3ML nebulizer solution Commonly known as: Xopenex Take 3 mLs (0.63 mg total) by nebulization every 4 (four) hours as needed for wheezing or shortness of breath.   lidocaine 5 % Commonly known as: LIDODERM Place 1 patch onto the skin daily as needed (pain).   metoprolol tartrate 25 mg/10  mL Susp Commonly known as: LOPRESSOR Place 5 mLs (12.5 mg total) into feeding tube daily. Start taking on: November 23, 2018   multivitamin Liqd Place 15 mLs into feeding tube daily. Start taking on: November 23, 2018   oxyCODONE 5 MG/5ML solution Commonly known as: ROXICODONE Take 5 mLs (5 mg total) by mouth every 12 (twelve) hours as needed for up to 7 days for severe pain.   sertraline 100 MG tablet Commonly known as: ZOLOFT Place 2 tablets (200 mg total) into feeding tube daily. Start taking on: November 23, 2018 What changed: how to take this   Spiriva Respimat 2.5 MCG/ACT Aers Generic drug: Tiotropium Bromide Monohydrate Inhale 2 puffs into the lungs daily.   traZODone 50 MG tablet Commonly known as: DESYREL Place 1 tablet (50 mg total) into feeding tube at bedtime. What changed: how to take this            Home Infusion Instuctions  (From admission, onward)         Start     Ordered   11/22/18 0000  Home infusion instructions [facility] May follow [facility] Dosing Protocol; May administer Cathflo as needed to maintain patency of vascular access device.; Flushing of vascular access device: per [facility] Protocol: 0.9% NaCl pre/post medica...    Question Answer Comment  Instructions May follow [facility] Pharm[facility] Dosing Protocol   Instructions May administer Cathflo as needed to maintain patency of vascular access device.   Instructions Flushing of vascular access device: per [facility] Protocol: 0.9% NaCl pre/post medication administration and prn patency; Heparin 100 u/ml, 5ml for implanted ports and Heparin 10u/ml, 5ml for all other central venous catheters.   Instructions May follow [facility] Anaphylaxis Protocol for First Dose Administration in the home: 0.9% NaCl at 25-50 ml/hr to maintain IV access for protocol meds. Epinephrine 0.3 ml IV/IM PRN and Benadryl 25-50 IV/IM PRN s/s of anaphylaxis.   Instructions [facility] Infusion Coordinator (RN) to assist per patient IV  care needs in the home PRN.      11/22/18 1244   11/22/18 0000  Home infusion instructions [facility] May follow [facility] Pharm[facility] Dosing Protocol; May administer Cathflo as needed to maintain patency of vascular access device.; Flushing of vascular access device: per [facility] Protocol: 0.9% NaCl pre/post medica...    Question Answer Comment  Instructions May follow [facility] Pharm[facility] Dosing Protocol   Instructions May administer Cathflo as needed to maintain patency of vascular access device.   Instructions Flushing of vascular access device: per [facility] Protocol: 0.9% NaCl pre/post medication administration and prn patency; Heparin 100 u/ml, 5ml for implanted ports and Heparin 10u/ml, 5ml for all other central venous catheters.   Instructions May follow [facility] Anaphylaxis Protocol for First Dose Administration in the home: 0.9% NaCl at 25-50 ml/hr to maintain IV access for protocol meds. Epinephrine 0.3 ml IV/IM PRN and Benadryl 25-50 IV/IM PRN s/s of anaphylaxis.   Instructions [facility] Infusion Coordinator (RN) to assist per  patient IV care needs in the home PRN.      11/22/18 1322         Follow-up Information    Loreli Slot, MD Follow up.   Specialty: Cardiothoracic Surgery Why: Your routine follow-up appointment is on 11/27/2018 @12 :45pm. Please arrive at 12:15pm for a chest xray located at [facility] which is on the first floor of our building.  Contact information: [address] [employer] [address] [place] [address] [phone]        Clinic, [facility] Call in 1 day(s).   Contact information: [address] [facility] [place] [address] [phone]           Signed: Sharlene Dory 11/22/2018, 2:52 PM

## 2018-11-08 NOTE — Progress Notes (Signed)
      [address]       [place] [address]             [phone]      Cultures growing resistant Klebsiella + a staph species Antibiotics changed to Vancomycin + meropenem His CXR this AM showed a basilar space. I think he is going to require a redo VATS to completely drain the effusion and decorticate lung. Will also plan to do a bronch to clear airway.  I discussed the general nature of the procedure, the need for general anesthesia, the incisions to be used and the use of drainage tubes postoperatively with Mr. Messerly. We discussed the expected hospital stay, overall recovery and short and long term outcomes. He understands the risks include, but are not limited to death, stroke, MI, DVT/PE, bleeding, possible need for transfusion, infections, prolonged air leaks, as well as other organ system dysfunction including respiratory, renal, or GI complications.   He accepts the risks and agrees to proceed.  For OR in AM  [place] C. Roxan Hockey, MD [facility] [phone]

## 2018-11-09 ENCOUNTER — Inpatient Hospital Stay (HOSPITAL_COMMUNITY): Payer: Medicare HMO | Admitting: Anesthesiology

## 2018-11-09 ENCOUNTER — Inpatient Hospital Stay (HOSPITAL_COMMUNITY): Payer: Medicare HMO

## 2018-11-09 ENCOUNTER — Encounter (HOSPITAL_COMMUNITY)
Admission: EM | Disposition: A | Discharge status: Home Health | Payer: Self-pay | Source: Home / Self Care | Attending: Thoracic Surgery (Cardiothoracic Vascular Surgery)

## 2018-11-09 DIAGNOSIS — J869 Pyothorax without fistula: Secondary | ICD-10-CM | POA: Diagnosis present

## 2018-11-09 HISTORY — PX: VIDEO BRONCHOSCOPY: SHX5072

## 2018-11-09 HISTORY — PX: VIDEO ASSISTED THORACOSCOPY (VATS)/DECORTICATION: SHX6171

## 2018-11-09 LAB — POCT I-STAT 4, (NA,K, GLUC, HGB,HCT)
Glucose, Bld: 81 mg/dL (ref 70–99)
Glucose, Bld: 104 mg/dL — ABNORMAL HIGH (ref 70–99)
Glucose, Bld: 117 mg/dL — ABNORMAL HIGH (ref 70–99)
HCT: 26 % — ABNORMAL LOW (ref 39.0–52.0)
HCT: 28 % — ABNORMAL LOW (ref 39.0–52.0)
HCT: 30 % — ABNORMAL LOW (ref 39.0–52.0)
Hemoglobin: 8.8 g/dL — ABNORMAL LOW (ref 13.0–17.0)
Hemoglobin: 9.5 g/dL — ABNORMAL LOW (ref 13.0–17.0)
Hemoglobin: 10.2 g/dL — ABNORMAL LOW (ref 13.0–17.0)
Potassium: 3.9 mmol/L (ref 3.5–5.1)
Potassium: 4.3 mmol/L (ref 3.5–5.1)
Potassium: 4.8 mmol/L (ref 3.5–5.1)
Sodium: 143 mmol/L (ref 135–145)
Sodium: 141 mmol/L (ref 135–145)
Sodium: 141 mmol/L (ref 135–145)

## 2018-11-09 LAB — CBC
HCT: 28.2 % — ABNORMAL LOW (ref 39.0–52.0)
Hemoglobin: 8.4 g/dL — ABNORMAL LOW (ref 13.0–17.0)
MCH: 25.5 pg — ABNORMAL LOW (ref 26.0–34.0)
MCHC: 29.8 g/dL — ABNORMAL LOW (ref 30.0–36.0)
MCV: 85.7 fL (ref 80.0–100.0)
Platelets: 529 10*3/uL — ABNORMAL HIGH (ref 150–400)
RBC: 3.29 MIL/uL — ABNORMAL LOW (ref 4.22–5.81)
RDW: 18.6 % — ABNORMAL HIGH (ref 11.5–15.5)
WBC: 18 10*3/uL — ABNORMAL HIGH (ref 4.0–10.5)
nRBC: 0 % (ref 0.0–0.2)

## 2018-11-09 LAB — BLOOD GAS, ARTERIAL
Acid-base deficit: 0.4 mmol/L (ref 0.0–2.0)
Acid-base deficit: 2 mmol/L (ref 0.0–2.0)
Bicarbonate: 23.8 mmol/L (ref 20.0–28.0)
Bicarbonate: 22.9 mmol/L (ref 20.0–28.0)
Drawn by: 51702
Drawn by: 535471
O2 Content: 3 L/min
O2 Saturation: 94.7 %
O2 Saturation: 99.2 %
Patient temperature: 98.8
Patient temperature: 98.6
pCO2 arterial: 39.7 mmHg (ref 32.0–48.0)
pCO2 arterial: 43.1 mmHg (ref 32.0–48.0)
pH, Arterial: 7.396 (ref 7.350–7.450)
pH, Arterial: 7.345 — ABNORMAL LOW (ref 7.350–7.450)
pO2, Arterial: 74.4 mmHg — ABNORMAL LOW (ref 83.0–108.0)
pO2, Arterial: 200 mmHg — ABNORMAL HIGH (ref 83.0–108.0)

## 2018-11-09 LAB — URINALYSIS, ROUTINE W REFLEX MICROSCOPIC
Bilirubin Urine: NEGATIVE
Glucose, UA: NEGATIVE mg/dL
Hgb urine dipstick: NEGATIVE
Ketones, ur: NEGATIVE mg/dL
Leukocytes,Ua: NEGATIVE
Nitrite: NEGATIVE
Protein, ur: NEGATIVE mg/dL
Specific Gravity, Urine: 1.013 (ref 1.005–1.030)
pH: 5 (ref 5.0–8.0)

## 2018-11-09 LAB — PREPARE RBC (CROSSMATCH)

## 2018-11-09 LAB — GLUCOSE, CAPILLARY
Glucose-Capillary: 89 mg/dL (ref 70–99)
Glucose-Capillary: 98 mg/dL (ref 70–99)

## 2018-11-09 LAB — TRIGLYCERIDES: Triglycerides: 67 mg/dL (ref <150)

## 2018-11-09 LAB — OCCULT BLOOD X 1 CARD TO LAB, STOOL: Fecal Occult Bld: NEGATIVE

## 2018-11-09 SURGERY — VIDEO ASSISTED THORACOSCOPY (VATS)/DECORTICATION
Anesthesia: General | Site: Chest | Laterality: Right

## 2018-11-09 MED ORDER — INSULIN ASPART 100 UNIT/ML ~~LOC~~ SOLN
0.0000 [IU] | Freq: Four times a day (QID) | SUBCUTANEOUS | Status: DC
  Administered 2018-11-22: 08:00:00 2 [IU] via SUBCUTANEOUS

## 2018-11-09 MED ORDER — ACETAMINOPHEN 500 MG PO TABS: 1000.0000 mg | ORAL_TABLET | Freq: Four times a day (QID) | ORAL | Status: DC

## 2018-11-09 MED ORDER — DEXAMETHASONE SODIUM PHOSPHATE 10 MG/ML IJ SOLN
INTRAMUSCULAR | Status: AC
  Filled 2018-11-09: qty 1

## 2018-11-09 MED ORDER — DEXAMETHASONE SODIUM PHOSPHATE 10 MG/ML IJ SOLN
INTRAMUSCULAR | Status: DC | PRN
  Administered 2018-11-09: 4 mg via INTRAVENOUS

## 2018-11-09 MED ORDER — PROPOFOL 10 MG/ML IV BOLUS
INTRAVENOUS | Status: AC
  Filled 2018-11-09: qty 20

## 2018-11-09 MED ORDER — FENTANYL CITRATE (PF) 250 MCG/5ML IJ SOLN
INTRAMUSCULAR | Status: AC
  Filled 2018-11-09: qty 5

## 2018-11-09 MED ORDER — FENTANYL CITRATE (PF) 100 MCG/2ML IJ SOLN: 25.0000 ug | INTRAMUSCULAR | Status: DC | PRN

## 2018-11-09 MED ORDER — SODIUM CHLORIDE 0.9 % IV SOLN: INTRAVENOUS | Status: DC | PRN

## 2018-11-09 MED ORDER — FENTANYL CITRATE (PF) 250 MCG/5ML IJ SOLN
INTRAMUSCULAR | Status: DC | PRN
  Administered 2018-11-09: 75 ug via INTRAVENOUS
  Administered 2018-11-09: 25 ug via INTRAVENOUS
  Administered 2018-11-09: 100 ug via INTRAVENOUS
  Administered 2018-11-09: 50 ug via INTRAVENOUS

## 2018-11-09 MED ORDER — MIDAZOLAM HCL 2 MG/2ML IJ SOLN
INTRAMUSCULAR | Status: AC
  Filled 2018-11-09: qty 2

## 2018-11-09 MED ORDER — ACETAMINOPHEN 160 MG/5ML PO SOLN: 1000.0000 mg | Freq: Four times a day (QID) | ORAL | Status: DC

## 2018-11-09 MED ORDER — PROPOFOL 1000 MG/100ML IV EMUL
INTRAVENOUS | Status: AC
  Filled 2018-11-09: qty 100

## 2018-11-09 MED ORDER — BISACODYL 5 MG PO TBEC
10.0000 mg | DELAYED_RELEASE_TABLET | Freq: Every day | ORAL | Status: DC
  Filled 2018-11-09: qty 2

## 2018-11-09 MED ORDER — PROPOFOL 10 MG/ML IV BOLUS
INTRAVENOUS | Status: DC | PRN
  Administered 2018-11-09: 80 mg via INTRAVENOUS

## 2018-11-09 MED ORDER — 0.9 % SODIUM CHLORIDE (POUR BTL) OPTIME
TOPICAL | Status: DC | PRN
  Administered 2018-11-09 (×2): 1000 mL
  Administered 2018-11-09: 2000 mL
  Administered 2018-11-09: 11:00:00 1000 mL

## 2018-11-09 MED ORDER — LIDOCAINE 2% (20 MG/ML) 5 ML SYRINGE
INTRAMUSCULAR | Status: DC | PRN
  Administered 2018-11-09: 40 mg via INTRAVENOUS

## 2018-11-09 MED ORDER — ONDANSETRON HCL 4 MG/2ML IJ SOLN
INTRAMUSCULAR | Status: AC
  Filled 2018-11-09: qty 2

## 2018-11-09 MED ORDER — LACTATED RINGERS IV SOLN
INTRAVENOUS | Status: DC | PRN
  Administered 2018-11-09: 07:00:00 via INTRAVENOUS

## 2018-11-09 MED ORDER — PROPOFOL 1000 MG/100ML IV EMUL
5.0000 ug/kg/min | INTRAVENOUS | Status: DC
  Administered 2018-11-09 – 2018-11-10 (×2): 20 ug/kg/min via INTRAVENOUS
  Filled 2018-11-09 (×2): qty 100

## 2018-11-09 MED ORDER — ENOXAPARIN SODIUM 40 MG/0.4ML ~~LOC~~ SOLN: 40.0000 mg | SUBCUTANEOUS | Status: DC

## 2018-11-09 MED ORDER — LIDOCAINE 2% (20 MG/ML) 5 ML SYRINGE
INTRAMUSCULAR | Status: AC
  Filled 2018-11-09: qty 5

## 2018-11-09 MED ORDER — ACETAMINOPHEN 650 MG RE SUPP
650.0000 mg | Freq: Four times a day (QID) | RECTAL | Status: DC
  Administered 2018-11-09 – 2018-11-11 (×5): 650 mg via RECTAL
  Filled 2018-11-09 (×5): qty 1

## 2018-11-09 MED ORDER — SUCCINYLCHOLINE CHLORIDE 200 MG/10ML IV SOSY
PREFILLED_SYRINGE | INTRAVENOUS | Status: DC | PRN
  Administered 2018-11-09: 100 mg via INTRAVENOUS

## 2018-11-09 MED ORDER — PROPOFOL 500 MG/50ML IV EMUL
5.0000 ug/kg/min | INTRAVENOUS | Status: DC
  Filled 2018-11-09: qty 50

## 2018-11-09 MED ORDER — ONDANSETRON HCL 4 MG/2ML IJ SOLN
INTRAMUSCULAR | Status: DC | PRN
  Administered 2018-11-09: 4 mg via INTRAVENOUS

## 2018-11-09 MED ORDER — BUPIVACAINE HCL (PF) 0.5 % IJ SOLN
INTRAMUSCULAR | Status: AC
  Filled 2018-11-09: qty 30

## 2018-11-09 MED ORDER — SENNOSIDES-DOCUSATE SODIUM 8.6-50 MG PO TABS: 1.0000 | ORAL_TABLET | Freq: Every day | ORAL | Status: DC

## 2018-11-09 MED ORDER — ONDANSETRON HCL 4 MG/2ML IJ SOLN: 4.0000 mg | Freq: Four times a day (QID) | INTRAMUSCULAR | Status: DC | PRN

## 2018-11-09 MED ORDER — SODIUM CHLORIDE 0.9 % IV SOLN: 10.0000 mL/h | Freq: Once | INTRAVENOUS | Status: DC

## 2018-11-09 MED ORDER — PHENYLEPHRINE 40 MCG/ML (10ML) SYRINGE FOR IV PUSH (FOR BLOOD PRESSURE SUPPORT)
PREFILLED_SYRINGE | INTRAVENOUS | Status: AC
  Filled 2018-11-09: qty 10

## 2018-11-09 MED ORDER — ROCURONIUM BROMIDE 10 MG/ML (PF) SYRINGE
PREFILLED_SYRINGE | INTRAVENOUS | Status: AC
  Filled 2018-11-09: qty 10

## 2018-11-09 MED ORDER — PROPOFOL 500 MG/50ML IV EMUL
INTRAVENOUS | Status: DC | PRN
  Administered 2018-11-09: 50 ug/kg/min via INTRAVENOUS

## 2018-11-09 MED ORDER — ALBUMIN HUMAN 5 % IV SOLN
INTRAVENOUS | Status: DC | PRN
  Administered 2018-11-09 (×2): via INTRAVENOUS

## 2018-11-09 MED ORDER — ROCURONIUM BROMIDE 10 MG/ML (PF) SYRINGE
PREFILLED_SYRINGE | INTRAVENOUS | Status: DC | PRN
  Administered 2018-11-09: 20 mg via INTRAVENOUS
  Administered 2018-11-09: 50 mg via INTRAVENOUS
  Administered 2018-11-09: 20 mg via INTRAVENOUS
  Administered 2018-11-09: 30 mg via INTRAVENOUS
  Administered 2018-11-09: 80 mg via INTRAVENOUS

## 2018-11-09 MED ORDER — SUCCINYLCHOLINE CHLORIDE 200 MG/10ML IV SOSY
PREFILLED_SYRINGE | INTRAVENOUS | Status: AC
  Filled 2018-11-09: qty 10

## 2018-11-09 MED ORDER — SODIUM CHLORIDE 0.9 % IV SOLN
INTRAVENOUS | Status: DC | PRN
  Administered 2018-11-09: 11:00:00 via INTRAVENOUS
  Administered 2018-11-09: 20 ug/min via INTRAVENOUS

## 2018-11-09 MED ORDER — BUPIVACAINE LIPOSOME 1.3 % IJ SUSP
20.0000 mL | Freq: Once | INTRAMUSCULAR | Status: DC
  Filled 2018-11-09: qty 20

## 2018-11-09 SURGICAL SUPPLY — 119 items
ADAPTER VALVE BIOPSY EBUS (MISCELLANEOUS) IMPLANT
ADH SKN CLS APL DERMABOND .7 (GAUZE/BANDAGES/DRESSINGS)
ADPTR VALVE BIOPSY EBUS (MISCELLANEOUS)
APL SRG 22X2 LUM MLBL SLNT (VASCULAR PRODUCTS)
APPLICATOR TIP EXT COSEAL (VASCULAR PRODUCTS) IMPLANT
BAG SPEC RTRVL LRG 6X4 10 (ENDOMECHANICALS)
BLADE CLIPPER SURG (BLADE) ×2 IMPLANT
BRUSH CYTOL CELLEBRITY 1.5X140 (MISCELLANEOUS) IMPLANT
CANISTER SUCT 3000ML PPV (MISCELLANEOUS) ×4 IMPLANT
CATH THORACIC 28FR (CATHETERS) IMPLANT
CATH THORACIC 28FR RT ANG (CATHETERS) IMPLANT
CATH THORACIC 36FR (CATHETERS) IMPLANT
CATH THORACIC 36FR RT ANG (CATHETERS) IMPLANT
CLEANER TIP ELECTROSURG 2X2 (MISCELLANEOUS) ×2 IMPLANT
CLIP VESOCCLUDE MED 6/CT (CLIP) ×4 IMPLANT
CONN ST 1/4X3/8  BEN (MISCELLANEOUS) ×4
CONN ST 1/4X3/8 BEN (MISCELLANEOUS) IMPLANT
CONN Y 3/8X3/8X3/8  BEN (MISCELLANEOUS) ×2
CONN Y 3/8X3/8X3/8 BEN (MISCELLANEOUS) IMPLANT
CONT SPEC 4OZ CLIKSEAL STRL BL (MISCELLANEOUS) ×12 IMPLANT
COVER BACK TABLE 60X90IN (DRAPES) ×4 IMPLANT
COVER SURGICAL LIGHT HANDLE (MISCELLANEOUS) ×4 IMPLANT
COVER WAND RF STERILE (DRAPES) ×2 IMPLANT
DERMABOND ADVANCED (GAUZE/BANDAGES/DRESSINGS)
DERMABOND ADVANCED .7 DNX12 (GAUZE/BANDAGES/DRESSINGS) IMPLANT
DRAIN CHANNEL 28F RND 3/8 FF (WOUND CARE) ×4 IMPLANT
DRAIN CHANNEL 32F RND 10.7 FF (WOUND CARE) ×2 IMPLANT
DRAPE CV SPLIT W-CLR ANES SCRN (DRAPES) ×4 IMPLANT
DRAPE ORTHO SPLIT 77X108 STRL (DRAPES) ×4
DRAPE SURG ORHT 6 SPLT 77X108 (DRAPES) ×2 IMPLANT
DRAPE WARM FLUID 44X44 (DRAPES) ×2 IMPLANT
ELECT BLADE 6.5 EXT (BLADE) ×4 IMPLANT
ELECT REM PT RETURN 9FT ADLT (ELECTROSURGICAL) ×4
ELECTRODE REM PT RTRN 9FT ADLT (ELECTROSURGICAL) ×2 IMPLANT
FILTER STRAW FLUID ASPIR (MISCELLANEOUS) IMPLANT
FORCEPS BIOP RJ4 1.8 (CUTTING FORCEPS) IMPLANT
FORCEPS RADIAL JAW LRG 4 PULM (INSTRUMENTS) IMPLANT
GAUZE SPONGE 4X4 12PLY STRL (GAUZE/BANDAGES/DRESSINGS) ×4 IMPLANT
GLOVE BIO SURGEON STRL SZ7.5 (GLOVE) ×2 IMPLANT
GLOVE BIOGEL PI IND STRL 6.5 (GLOVE) IMPLANT
GLOVE BIOGEL PI IND STRL 7.5 (GLOVE) IMPLANT
GLOVE BIOGEL PI INDICATOR 6.5 (GLOVE) ×6
GLOVE BIOGEL PI INDICATOR 7.5 (GLOVE) ×2
GLOVE SKINSENSE NS SZ6.5 (GLOVE) ×2
GLOVE SKINSENSE STRL SZ6.5 (GLOVE) IMPLANT
GLOVE SURG SIGNA 7.5 PF LTX (GLOVE) ×8 IMPLANT
GOWN STRL NON-REIN LRG LVL3 (GOWN DISPOSABLE) ×4 IMPLANT
GOWN STRL REUS W/ TWL LRG LVL3 (GOWN DISPOSABLE) ×4 IMPLANT
GOWN STRL REUS W/ TWL XL LVL3 (GOWN DISPOSABLE) ×2 IMPLANT
GOWN STRL REUS W/TWL LRG LVL3 (GOWN DISPOSABLE) ×12
GOWN STRL REUS W/TWL XL LVL3 (GOWN DISPOSABLE) ×8
HANDLE STAPLE ENDO GIA SHORT (STAPLE)
HEMOSTAT SURGICEL 2X14 (HEMOSTASIS) IMPLANT
KIT BASIN OR (CUSTOM PROCEDURE TRAY) ×4 IMPLANT
KIT CLEAN ENDO COMPLIANCE (KITS) ×4 IMPLANT
KIT TURNOVER KIT B (KITS) ×4 IMPLANT
MARKER SKIN DUAL TIP RULER LAB (MISCELLANEOUS) ×6 IMPLANT
NDL HYPO 25GX1X1/2 BEV (NEEDLE) ×2 IMPLANT
NDL SPNL 18GX3.5 QUINCKE PK (NEEDLE) IMPLANT
NDL SPNL 22GX3.5 QUINCKE BK (NEEDLE) ×2 IMPLANT
NEEDLE HYPO 25GX1X1/2 BEV (NEEDLE) IMPLANT
NEEDLE SPNL 18GX3.5 QUINCKE PK (NEEDLE) IMPLANT
NEEDLE SPNL 22GX3.5 QUINCKE BK (NEEDLE) IMPLANT
NS IRRIG 1000ML POUR BTL (IV SOLUTION) ×12 IMPLANT
OIL SILICONE PENTAX (PARTS (SERVICE/REPAIRS)) ×4 IMPLANT
PACK CHEST (CUSTOM PROCEDURE TRAY) ×4 IMPLANT
PAD ARMBOARD 7.5X6 YLW CONV (MISCELLANEOUS) ×8 IMPLANT
POUCH ENDO CATCH II 15MM (MISCELLANEOUS) IMPLANT
POUCH SPECIMEN RETRIEVAL 10MM (ENDOMECHANICALS) IMPLANT
RADIAL JAW LRG 4 PULMONARY (INSTRUMENTS)
RELOAD STAPLE 60 BLK VRY/THCK (STAPLE) IMPLANT
RELOAD STAPLER 60MM BLK (STAPLE) ×2 IMPLANT
SEALANT PROGEL (MISCELLANEOUS) IMPLANT
SEALANT SURG COSEAL 4ML (VASCULAR PRODUCTS) IMPLANT
SEALANT SURG COSEAL 8ML (VASCULAR PRODUCTS) IMPLANT
SOL ANTI FOG 6CC (MISCELLANEOUS) ×2 IMPLANT
SOLUTION ANTI FOG 6CC (MISCELLANEOUS) ×2
SPONGE INTESTINAL PEANUT (DISPOSABLE) ×8 IMPLANT
SPONGE LAP 18X18 RF (DISPOSABLE) ×4 IMPLANT
SPONGE TONSIL TAPE 1 RFD (DISPOSABLE) ×4 IMPLANT
STAPLE ECHEON FLEX 60 POW ENDO (STAPLE) ×2 IMPLANT
STAPLER ENDO GIA 12 SHRT THIN (STAPLE) IMPLANT
STAPLER ENDO GIA 12MM SHORT (STAPLE) IMPLANT
STAPLER RELOAD 60MM BLK (STAPLE) ×4
STAPLER VISISTAT 35W (STAPLE) ×2 IMPLANT
SUT PROLENE 4 0 RB 1 (SUTURE)
SUT PROLENE 4-0 RB1 .5 CRCL 36 (SUTURE) IMPLANT
SUT SILK  1 MH (SUTURE) ×6
SUT SILK 1 MH (SUTURE) ×4 IMPLANT
SUT SILK 1 TIES 10X30 (SUTURE) ×2 IMPLANT
SUT SILK 2 0SH CR/8 30 (SUTURE) ×2 IMPLANT
SUT SILK 3 0SH CR/8 30 (SUTURE) IMPLANT
SUT VIC AB 1 CTX 36 (SUTURE)
SUT VIC AB 1 CTX36XBRD ANBCTR (SUTURE) IMPLANT
SUT VIC AB 2-0 CTX 36 (SUTURE) IMPLANT
SUT VIC AB 3-0 MH 27 (SUTURE) IMPLANT
SUT VIC AB 3-0 SH 27 (SUTURE) ×12
SUT VIC AB 3-0 SH 27X BRD (SUTURE) IMPLANT
SUT VIC AB 3-0 X1 27 (SUTURE) ×2 IMPLANT
SUT VICRYL 2 TP 1 (SUTURE) IMPLANT
SYR 10ML LL (SYRINGE) IMPLANT
SYR 20ML ECCENTRIC (SYRINGE) ×6 IMPLANT
SYR 30ML LL (SYRINGE) ×2 IMPLANT
SYR 5ML LL (SYRINGE) ×4 IMPLANT
SYR 5ML LUER SLIP (SYRINGE) ×2 IMPLANT
SYSTEM SAHARA CHEST DRAIN ATS (WOUND CARE) ×4 IMPLANT
TAPE CLOTH 4X10 WHT NS (GAUZE/BANDAGES/DRESSINGS) ×4 IMPLANT
TIP APPLICATOR SPRAY EXTEND 16 (VASCULAR PRODUCTS) IMPLANT
TOWEL GREEN STERILE (TOWEL DISPOSABLE) ×4 IMPLANT
TOWEL GREEN STERILE FF (TOWEL DISPOSABLE) ×4 IMPLANT
TRAP SPECIMEN MUCOUS 40CC (MISCELLANEOUS) ×4 IMPLANT
TRAY FOLEY MTR SLVR 16FR STAT (SET/KITS/TRAYS/PACK) ×6 IMPLANT
TROCAR XCEL BLADELESS 5X75MML (TROCAR) ×4 IMPLANT
TUBE CONNECTING 20'X1/4 (TUBING) ×2
TUBE CONNECTING 20X1/4 (TUBING) ×4 IMPLANT
VALVE BIOPSY  SINGLE USE (MISCELLANEOUS) ×4
VALVE BIOPSY SINGLE USE (MISCELLANEOUS) ×2 IMPLANT
VALVE SUCTION BRONCHIO DISP (MISCELLANEOUS) ×2 IMPLANT
WATER STERILE IRR 1000ML POUR (IV SOLUTION) ×4 IMPLANT

## 2018-11-09 NOTE — Progress Notes (Signed)
Patient ID: William Kirk, male   DOB: May 04, 1940, 78 y.o.   MRN: [id number] EVENING ROUNDS NOTE :     [address]       [place] [address]             [phone]                 Day of Surgery Procedure(s) (LRB): REDO VIDEO ASSISTED THORACOSCOPY (VATS)/DECORTICATION/DRAIN EFFUSION (Right) VIDEO BRONCHOSCOPY (N/A)  Total Length of Stay:  LOS: 3 days  BP (!) 112/49   Pulse (!) 49   Temp 98.8 F (37.1 C) (Oral)   Resp 13   Ht 5\' 11"  (1.803 m)   Wt 64.9 kg   SpO2 100%   BMI 19.96 kg/m   .Intake/Output      10/01 0701 - 10/02 0700 10/02 0701 - 10/03 0700   P.O. 360    I.V. (mL/kg) 1846.4 (28.5) 1400 (21.6)   Blood 718.8 945   IV Piggyback 360.9 500   Total Intake(mL/kg) 3286.1 (50.6) 2845 (43.8)   Urine (mL/kg/hr) 1025 (0.7) 500 (0.7)   Blood  1200   Chest Tube 145 90   Total Output 1170 1790   Net +2116.1 +1055          . sodium chloride    . 0.9 % NaCl with KCl 20 mEq / L 100 mL/hr at 11/08/18 1205  . meropenem (MERREM) IV 1 g (11/09/18 0552)  . propofol (DIPRIVAN) infusion    . vancomycin 166.7 mL/hr at 11/08/18 1900     Lab Results  Component Value Date   WBC 18.0 (H) 11/09/2018   HGB 10.2 (L) 11/09/2018   HCT 30.0 (L) 11/09/2018   PLT 529 (H) 11/09/2018   GLUCOSE 117 (H) 11/09/2018   TRIG 76 07/11/2018   ALT 8 10/20/2018   AST 23 10/20/2018   NA 141 11/09/2018   K 4.8 11/09/2018   CL 110 11/08/2018   CREATININE 1.05 11/08/2018   BUN 17 11/08/2018   CO2 25 11/08/2018   INR 1.4 (H) 11/08/2018    Stable postop, on vent with plan to wean and extubated in am as tolerated  Grace Isaac MD  Beeper [phone] Office [phone] 11/09/2018 6:29 PM

## 2018-11-09 NOTE — Brief Op Note (Addendum)
11/06/2018 - 11/09/2018  11:48 AM  PATIENT:  William Kirk  78 y.o. male  PRE-OPERATIVE DIAGNOSIS:  RIGHT EMPYEMA  POST-OPERATIVE DIAGNOSIS:  RIGHT EMPYEMA  PROCEDURE:   VIDEO BRONCHOSCOPY,  REDO RIGHT VIDEO ASSISTED THORACOSCOPY (VATS), DRAIN EMPYEMA,  DECORTICATION  SURGEON:  Surgeon(s) and Role:    Melrose Nakayama, MD - Primary  PHYSICIAN ASSISTANT: Lars Pinks PA-C  ANESTHESIA:   general  EBL:  1200 mL   BLOOD ADMINISTERED:Three CC PRBC  DRAINS: 28 and 32 Blake drains placed in the right pleural space   SPECIMEN:  Source of Specimen:  Right pleural peel and portion of necrotic lung  DISPOSITION OF SPECIMEN:  PATHOLOGY  COUNTS CORRECT:  YES  DICTATION: .Dragon Dictation  PLAN OF CARE: Admit to inpatient   PATIENT DISPOSITION:  ICU - intubated and hemodynamically stable.   Delay start of Pharmacological VTE agent (>24hrs) due to surgical blood loss or risk of bleeding: yes  Severe empyema with thick fibrous pleural peel. Multiple visceral pleural tears during decortication

## 2018-11-09 NOTE — Anesthesia Postprocedure Evaluation (Signed)
Anesthesia Post Note  Patient: RAYLEN LEDDON  Procedure(s) Performed: REDO VIDEO ASSISTED THORACOSCOPY (VATS)/DECORTICATION/DRAIN EFFUSION (Right Chest) VIDEO BRONCHOSCOPY (N/A )     Patient location during evaluation: PACU Anesthesia Type: General Level of consciousness: sedated and patient remains intubated per anesthesia plan Pain management: pain level controlled Vital Signs Assessment: post-procedure vital signs reviewed and stable Respiratory status: patient remains intubated per anesthesia plan and patient on ventilator - see flowsheet for VS Cardiovascular status: blood pressure returned to baseline and stable Postop Assessment: no apparent nausea or vomiting Anesthetic complications: no    Last Vitals:  Vitals:   11/09/18 1416 11/09/18 1455  BP: (!) 118/51 (!) 145/40  Pulse: (!) 42 (!) 46  Resp: 12 12  Temp:    SpO2: 100% 100%    Last Pain:  Vitals:   11/09/18 0321  TempSrc: Oral  PainSc:                  Evelyne Makepeace,E. Shivan Hodes

## 2018-11-09 NOTE — Anesthesia Procedure Notes (Signed)
Procedure Name: Intubation Date/Time: 11/09/2018 7:53 AM Performed by: Renato Shin, CRNA Pre-anesthesia Checklist: Patient identified, Emergency Drugs available, Suction available and Patient being monitored Patient Re-evaluated:Patient Re-evaluated prior to induction Oxygen Delivery Method: Circle system utilized Preoxygenation: Pre-oxygenation with 100% oxygen Induction Type: IV induction Ventilation: Mask ventilation without difficulty Laryngoscope Size: Miller and 3 Grade View: Grade I Tube type: Oral Tube size: 8.0 mm Number of attempts: 1 Airway Equipment and Method: Stylet and Oral airway Placement Confirmation: ETT inserted through vocal cords under direct vision,  positive ETCO2 and breath sounds checked- equal and bilateral Secured at: 21 cm Tube secured with: Tape Dental Injury: Teeth and Oropharynx as per pre-operative assessment

## 2018-11-09 NOTE — Anesthesia Procedure Notes (Addendum)
Central Venous Catheter Insertion Performed by: Annye Asa, MD, anesthesiologist Start/End10/03/2018 7:12 AM, 11/09/2018 7:30 AM Patient location: Pre-op. Preanesthetic checklist: patient identified, IV checked, site marked, risks and benefits discussed, surgical consent, monitors and equipment checked, pre-op evaluation, timeout performed and anesthesia consent Position: supine Lidocaine 1% used for infiltration and patient sedated Hand hygiene performed , maximum sterile barriers used  and Seldinger technique used Catheter size: 8 Fr Central line was placed.Double lumen Procedure performed using ultrasound guided technique. Ultrasound Notes:anatomy identified, needle tip was noted to be adjacent to the nerve/plexus identified, no ultrasound evidence of intravascular and/or intraneural injection and image(s) printed for medical record Attempts: 1 Following insertion, line sutured, dressing applied and Biopatch. Post procedure assessment: blood return through all ports, free fluid flow and no air  Patient tolerated the procedure well with no immediate complications. Additional procedure comments: CVP: Timeout, sterile prep, drape, FBP R neck.  Supine position.  1% lido local, finder and trocar RIJ 1st pass with US guidance.  2 lumen placed over J wire. Biopatch and sterile dressing on.  Patient tolerated well.  VSS.  Jenita Seashore, MD.

## 2018-11-09 NOTE — Transfer of Care (Signed)
Immediate Anesthesia Transfer of Care Note  Patient: Clovis Riley  Procedure(s) Performed: REDO VIDEO ASSISTED THORACOSCOPY (VATS)/DECORTICATION/DRAIN EFFUSION (Right Chest) VIDEO BRONCHOSCOPY (N/A )  Patient Location: PACU  Anesthesia Type:General  Level of Consciousness: sedated and Patient remains intubated per anesthesia plan  Airway & Oxygen Therapy: Patient remains intubated per anesthesia plan and Patient placed on Ventilator (see vital sign flow sheet for setting)  Post-op Assessment: Report given to RN and Post -op Vital signs reviewed and stable  Post vital signs: Reviewed and stable  Last Vitals:  Vitals Value Taken Time  BP    Temp    Pulse 48 11/09/18 1239  Resp 12 11/09/18 1239  SpO2 100 % 11/09/18 1239  Vitals shown include unvalidated device data.  Last Pain:  Vitals:   11/09/18 0321  TempSrc: Oral  PainSc:       Patients Stated Pain Goal: 4 ([id number])  Complications: No apparent anesthesia complications

## 2018-11-09 NOTE — Consult Note (Signed)
[facility] CM Inpatient Consult   11/09/2018  William Kirk [birth year] [id number]    Patient was evaluated for [facility] Care Management service needs for a 29% high risk score for unplanned readmission, with 4 hospitalizations in the past 6 months and a 30 day readmission underhis [facility] plan.  Chart review and MD history & physical on9/29/20 show as follows: William Kirk is a 78 year old gentleman with a history of tobacco abuse,COPD, bronchiectasis, recurrent pneumonia, recurrent hemoptysis, peripheral arterial disease, gastroesophageal reflux, and depression. He has had multiple pneumonias with chronic bronchiectasis and atelectasis in the right lower lobe. He was admitted twice earlier this year with massive hemoptysis.He underwent a thoracoscopic right lower lobectomy on 10/18/2018. His post operative course was complicated by atrial fibrillation which converted to SR with amiodarone.  He presents with c/o shortness of breath and drainage from incision, with the diagnosis of EMPYEMA, undergoing Redo Video Assisted Thoracoscopy (VATS).  Transition of care CM note shows that he is a [place] patient. He goes to [facility] CSW is William Kirk [phone] pager, phone [phone] ext [phone], and his medications are filled through the [facility] per patient. [facility]  [facility] Randel Kirk, who is[facility] provider and not affiliated with [facility].  Patient isNOTcurrently a beneficiary of the attributed ACO Registry in the [employer]  Membership roster was used to verify non-eligible status.  Reason:The patient'sPCPis not a [facility] primary care provider and is not [facility] affiliated.    For questions and additional information, please call:  William Kirk, BSN, RN-BC [facility] Liaison Cell: [phone]

## 2018-11-09 NOTE — Interval H&P Note (Signed)
History and Physical Interval Note:  11/09/2018 7:15 AM  William Kirk  has presented today for surgery, with the diagnosis of EMPYEMA.  The various methods of treatment have been discussed with the patient and family. After consideration of risks, benefits and other options for treatment, the patient has consented to  Procedure(s): REDO VIDEO ASSISTED THORACOSCOPY (VATS)/DECORTICATION/DRAIN EFFUSION (Right) VIDEO BRONCHOSCOPY (N/A) as a surgical intervention.  The patient's history has been reviewed, patient examined, no change in status, stable for surgery.  I have reviewed the patient's chart and labs.  Questions were answered to the patient's satisfaction.     Melrose Nakayama

## 2018-11-09 NOTE — Anesthesia Procedure Notes (Signed)
Procedure Name: Intubation Date/Time: 11/09/2018 12:08 PM Performed by: Renato Shin, CRNA Pre-anesthesia Checklist: Patient identified, Emergency Drugs available, Suction available and Patient being monitored Patient Re-evaluated:Patient Re-evaluated prior to induction Oxygen Delivery Method: Circle system utilized Preoxygenation: Pre-oxygenation with 100% oxygen Induction Type: IV induction Ventilation: Mask ventilation without difficulty Laryngoscope Size: Miller and 3 Tube type: Oral Tube size: 7.5 mm Number of attempts: 1 Airway Equipment and Method: Stylet and Oral airway Placement Confirmation: ETT inserted through vocal cords under direct vision,  positive ETCO2 and breath sounds checked- equal and bilateral Secured at: 21 cm Tube secured with: Tape Dental Injury: Teeth and Oropharynx as per pre-operative assessment

## 2018-11-09 NOTE — Progress Notes (Signed)
Propofol continued per Dr. Roxan Hockey, made aware of patient continued sustained bradycardia at a rate of 40.   NEO weaned, BP stable [id number] systolic

## 2018-11-09 NOTE — Anesthesia Procedure Notes (Signed)
Arterial Line Insertion Start/End10/03/2018 7:20 AM, 11/09/2018 7:20 AM Performed by: Sammie Bench, CRNA, CRNA  Patient location: Pre-op. Preanesthetic checklist: patient identified, IV checked, site marked, risks and benefits discussed, surgical consent, monitors and equipment checked, pre-op evaluation, timeout performed and anesthesia consent Lidocaine 1% used for infiltration Right, radial was placed Catheter size: 20 G Hand hygiene performed , maximum sterile barriers used  and Seldinger technique used Allen's test indicative of satisfactory collateral circulation Attempts: 1 Procedure performed without using ultrasound guided technique. Following insertion, dressing applied and Biopatch. Post procedure assessment: normal  Patient tolerated the procedure well with no immediate complications.

## 2018-11-09 NOTE — Anesthesia Procedure Notes (Signed)
Procedure Name: Intubation Date/Time: 11/09/2018 8:27 AM Performed by: Renato Shin, CRNA Pre-anesthesia Checklist: Patient identified, Emergency Drugs available, Suction available and Patient being monitored Patient Re-evaluated:Patient Re-evaluated prior to induction Oxygen Delivery Method: Circle system utilized Preoxygenation: Pre-oxygenation with 100% oxygen Induction Type: IV induction Laryngoscope Size: Miller and 3 Grade View: Grade I Endobronchial tube: Left, Double lumen EBT, EBT position confirmed by fiberoptic bronchoscope and EBT position confirmed by auscultation and 39 Fr Number of attempts: 1 Airway Equipment and Method: Stylet and Oral airway Placement Confirmation: ETT inserted through vocal cords under direct vision,  positive ETCO2 and breath sounds checked- equal and bilateral Tube secured with: Tape Dental Injury: Teeth and Oropharynx as per pre-operative assessment

## 2018-11-10 ENCOUNTER — Inpatient Hospital Stay (HOSPITAL_COMMUNITY): Payer: Medicare HMO

## 2018-11-10 ENCOUNTER — Encounter (HOSPITAL_COMMUNITY): Payer: Self-pay | Admitting: Thoracic Surgery (Cardiothoracic Vascular Surgery)

## 2018-11-10 LAB — BPAM RBC
Blood Product Expiration Date: 202010052359
Blood Product Expiration Date: 202010262359
Blood Product Expiration Date: 202011022359
Blood Product Expiration Date: 202011052359
Blood Product Expiration Date: 202011052359
ISSUE DATE / TIME: 202009300544
ISSUE DATE / TIME: 202010010819
ISSUE DATE / TIME: 202010020737
ISSUE DATE / TIME: 202010020737
ISSUE DATE / TIME: 202010021032
Unit Type and Rh: 5100
Unit Type and Rh: 5100
Unit Type and Rh: 5100
Unit Type and Rh: 5100
Unit Type and Rh: 5100

## 2018-11-10 LAB — POCT I-STAT 7, (LYTES, BLD GAS, ICA,H+H)
Acid-base deficit: 4 mmol/L — ABNORMAL HIGH (ref 0.0–2.0)
Acid-base deficit: 2 mmol/L (ref 0.0–2.0)
Bicarbonate: 20.9 mmol/L (ref 20.0–28.0)
Bicarbonate: 23.1 mmol/L (ref 20.0–28.0)
Calcium, Ion: 1.15 mmol/L (ref 1.15–1.40)
Calcium, Ion: 1.17 mmol/L (ref 1.15–1.40)
HCT: 28 % — ABNORMAL LOW (ref 39.0–52.0)
HCT: 35 % — ABNORMAL LOW (ref 39.0–52.0)
Hemoglobin: 9.5 g/dL — ABNORMAL LOW (ref 13.0–17.0)
Hemoglobin: 11.9 g/dL — ABNORMAL LOW (ref 13.0–17.0)
O2 Saturation: 99 %
O2 Saturation: 98 %
Patient temperature: 97.6
Patient temperature: 98.7
Potassium: 4.2 mmol/L (ref 3.5–5.1)
Potassium: 4.4 mmol/L (ref 3.5–5.1)
Sodium: 142 mmol/L (ref 135–145)
Sodium: 143 mmol/L (ref 135–145)
TCO2: 22 mmol/L (ref 22–32)
TCO2: 24 mmol/L (ref 22–32)
pCO2 arterial: 34.4 mmHg (ref 32.0–48.0)
pCO2 arterial: 39.5 mmHg (ref 32.0–48.0)
pH, Arterial: 7.39 (ref 7.350–7.450)
pH, Arterial: 7.374 (ref 7.350–7.450)
pO2, Arterial: 123 mmHg — ABNORMAL HIGH (ref 83.0–108.0)
pO2, Arterial: 114 mmHg — ABNORMAL HIGH (ref 83.0–108.0)

## 2018-11-10 LAB — TYPE AND SCREEN
ABO/RH(D): O POS
Antibody Screen: NEGATIVE
Unit division: 0
Unit division: 0
Unit division: 0
Unit division: 0
Unit division: 0

## 2018-11-10 LAB — BASIC METABOLIC PANEL
Anion gap: 7 (ref 5–15)
BUN: 14 mg/dL (ref 8–23)
CO2: 21 mmol/L — ABNORMAL LOW (ref 22–32)
Calcium: 7.3 mg/dL — ABNORMAL LOW (ref 8.9–10.3)
Chloride: 112 mmol/L — ABNORMAL HIGH (ref 98–111)
Creatinine, Ser: 1.18 mg/dL (ref 0.61–1.24)
GFR calc Af Amer: >60 mL/min (ref >60)
GFR calc non Af Amer: 60 mL/min — ABNORMAL LOW (ref >60)
Glucose, Bld: 82 mg/dL (ref 70–99)
Potassium: 4.2 mmol/L (ref 3.5–5.1)
Sodium: 140 mmol/L (ref 135–145)

## 2018-11-10 LAB — CBC
HCT: 29.4 % — ABNORMAL LOW (ref 39.0–52.0)
Hemoglobin: 9.5 g/dL — ABNORMAL LOW (ref 13.0–17.0)
MCH: 28.4 pg (ref 26.0–34.0)
MCHC: 32.3 g/dL (ref 30.0–36.0)
MCV: 87.8 fL (ref 80.0–100.0)
Platelets: 359 10*3/uL (ref 150–400)
RBC: 3.35 MIL/uL — ABNORMAL LOW (ref 4.22–5.81)
RDW: 17.2 % — ABNORMAL HIGH (ref 11.5–15.5)
WBC: 15.5 10*3/uL — ABNORMAL HIGH (ref 4.0–10.5)
nRBC: 0 % (ref 0.0–0.2)

## 2018-11-10 LAB — GLUCOSE, CAPILLARY
Glucose-Capillary: 76 mg/dL (ref 70–99)
Glucose-Capillary: 69 mg/dL — ABNORMAL LOW (ref 70–99)
Glucose-Capillary: 62 mg/dL — ABNORMAL LOW (ref 70–99)
Glucose-Capillary: 122 mg/dL — ABNORMAL HIGH (ref 70–99)
Glucose-Capillary: 91 mg/dL (ref 70–99)

## 2018-11-10 MED ORDER — DEXTROSE-NACL 5-0.45 % IV SOLN
INTRAVENOUS | Status: DC
  Administered 2018-11-10 – 2018-11-13 (×4): via INTRAVENOUS

## 2018-11-10 MED ORDER — CHLORHEXIDINE GLUCONATE 0.12% ORAL RINSE (MEDLINE KIT)
15.0000 mL | Freq: Two times a day (BID) | OROMUCOSAL | Status: DC
  Administered 2018-11-10 (×2): 15 mL via OROMUCOSAL

## 2018-11-10 MED ORDER — DEXTROSE 50 % IV SOLN
1.0000 | Freq: Once | INTRAVENOUS | Status: AC
  Administered 2018-11-10: 18:00:00 50 mL via INTRAVENOUS

## 2018-11-10 MED ORDER — ORAL CARE MOUTH RINSE
15.0000 mL | OROMUCOSAL | Status: DC
  Administered 2018-11-10 (×8): 15 mL via OROMUCOSAL

## 2018-11-10 MED ORDER — SODIUM CHLORIDE 0.9% FLUSH: 10.0000 mL | INTRAVENOUS | Status: DC | PRN

## 2018-11-10 MED ORDER — DEXTROSE 50 % IV SOLN
INTRAVENOUS | Status: AC
  Filled 2018-11-10: qty 50

## 2018-11-10 MED ORDER — SODIUM CHLORIDE 0.9% FLUSH
10.0000 mL | Freq: Two times a day (BID) | INTRAVENOUS | Status: DC
  Administered 2018-11-10 – 2018-11-22 (×22): 10 mL

## 2018-11-10 NOTE — Progress Notes (Signed)
RT obtained ABG on pt with the following results. No changes at this time. Pt respiratory status is stable on vent. RT will continue to monitor.  Results for William Kirk, William Kirk (MRN [id number]) as of 11/10/2018 04:52  Ref. Range 11/10/2018 04:04  Sample type Unknown ARTERIAL  pH, Arterial Latest Ref Range: 7.350 - 7.450  7.390  pCO2 arterial Latest Ref Range: 32.0 - 48.0 mmHg 34.4  pO2, Arterial Latest Ref Range: 83.0 - 108.0 mmHg 123.0 (H)  TCO2 Latest Ref Range: 22 - 32 mmol/L 22  Acid-base deficit Latest Ref Range: 0.0 - 2.0 mmol/L 4.0 (H)  Bicarbonate Latest Ref Range: 20.0 - 28.0 mmol/L 20.9  O2 Saturation Latest Units: % 99.0  Patient temperature Unknown 97.6 F  Collection site Unknown ARTERIAL LINE

## 2018-11-10 NOTE — Progress Notes (Signed)
Patient ID: William Kirk, male   DOB: [birth year], 78 y.o.   MRN: [id number] [facility] DAILY ICU PROGRESS NOTE                   [address]            [place] [address]          [phone]   1 Day Post-Op Procedure(s) (LRB): REDO VIDEO ASSISTED THORACOSCOPY (VATS)/DECORTICATION/DRAIN EFFUSION (Right) VIDEO BRONCHOSCOPY (N/A)  Total Length of Stay:  LOS: 4 days   Subjective: Patient awake neurologically intact on ventilator weaning  Objective: Vital signs in last 24 hours: Temp:  [97.6 F (36.4 C)-97.7 F (36.5 C)] 97.7 F (36.5 C) (10/03 0810) Pulse Rate:  [42-55] 45 (10/03 0730) Cardiac Rhythm: Sinus bradycardia (10/03 0730) Resp:  [12-15] 15 (10/03 0730) BP: (109-153)/(40-62) 138/47 (10/03 0730) SpO2:  [97 %-100 %] 99 % (10/03 0730) Arterial Line BP: (110-152)/(42-54) 121/43 (10/03 0730) FiO2 (%):  [30 %-50 %] 40 % (10/03 0730)  Filed Weights   11/06/18 1000 11/07/18 2116 11/08/18 0404  Weight: 67.5 kg 64.9 kg 64.9 kg    Weight change:    Hemodynamic parameters for last 24 hours:    Intake/Output from previous day: 10/02 0701 - 10/03 0700 In: 3939.6 [I.V.:2294.7; Blood:945; IV Piggyback:699.9] Out: 2890 [Urine:1190; Blood:1200; Chest Tube:500]  Intake/Output this shift: No intake/output data recorded.  Current Meds: Scheduled Meds: . acetaminophen  650 mg Rectal Q6H  . amiodarone  200 mg Oral Daily  . atorvastatin  20 mg Oral QHS  . bisacodyl  10 mg Oral Daily  . chlorhexidine gluconate (MEDLINE KIT)  15 mL Mouth Rinse BID  . Chlorhexidine Gluconate Cloth  6 each Topical Daily  . folic acid  1 mg Oral Daily  . gabapentin  300 mg Oral QHS  . insulin aspart  0-24 Units Subcutaneous Q6H  . mouth rinse  15 mL Mouth Rinse 10 times per day  . metoprolol succinate  12.5 mg Oral Daily  . mometasone-formoterol  2 puff Inhalation BID  . multivitamin with minerals  1 tablet Oral Daily  . senna-docusate  1 tablet Oral QHS  . sertraline  200 mg  Oral Daily  . sodium chloride flush  10-40 mL Intracatheter Q12H  . traZODone  50 mg Oral QHS  . umeclidinium bromide  1 puff Inhalation Daily  . vitamin B-12  1,000 mcg Oral Daily   Continuous Infusions: . sodium chloride    . 0.9 % NaCl with KCl 20 mEq / L 100 mL/hr at 11/09/18 2301  . meropenem (MERREM) IV 1 g (11/10/18 8413)  . propofol (DIPRIVAN) infusion 20 mcg/kg/min (11/10/18 0536)  . vancomycin 1,250 mg (11/10/18 0917)   PRN Meds:.Place/Maintain arterial line **AND** sodium chloride, diclofenac sodium, lidocaine, morphine injection, ondansetron (ZOFRAN) IV, oxyCODONE, sodium chloride flush, traMADol  General appearance: alert, cooperative and no distress Neurologic: intact Heart: regular rate and rhythm, S1, S2 normal, no murmur, click, rub or gallop Lungs: diminished breath sounds RLL Abdomen: soft, non-tender; bowel sounds normal; no masses,  no organomegaly Extremities: extremities normal, atraumatic, no cyanosis or edema and Homans sign is negative, no sign of DVT Wound: 2+ airleak from right chest tubes  Lab Results: CBC: Recent Labs    11/09/18 0152  11/10/18 0404 11/10/18 0501  WBC 18.0*  --   --  15.5*  HGB 8.4*   < > 9.5* 9.5*  HCT 28.2*   < > 28.0* 29.4*  PLT  529*  --   --  359   < > = values in this interval not displayed.   BMET:  Recent Labs    11/08/18 0210  11/09/18 1119 11/10/18 0404 11/10/18 0501  NA 141   < > 141 142 140  K 3.8   < > 4.8 4.2 4.2  CL 110  --   --   --  112*  CO2 25  --   --   --  21*  GLUCOSE 103*   < > 117*  --  82  BUN 17  --   --   --  14  CREATININE 1.05  --   --   --  1.18  CALCIUM 7.9*  --   --   --  7.3*   < > = values in this interval not displayed.    CMET: Lab Results  Component Value Date   WBC 15.5 (H) 11/10/2018   HGB 9.5 (L) 11/10/2018   HCT 29.4 (L) 11/10/2018   PLT 359 11/10/2018   GLUCOSE 82 11/10/2018   TRIG 67 11/09/2018   ALT 8 10/20/2018   AST 23 10/20/2018   NA 140 11/10/2018   K 4.2  11/10/2018   CL 112 (H) 11/10/2018   CREATININE 1.18 11/10/2018   BUN 14 11/10/2018   CO2 21 (L) 11/10/2018   INR 1.4 (H) 11/08/2018      PT/INR:  Recent Labs    11/08/18 2024  LABPROT 16.9*  INR 1.4*   Radiology: Dg Chest Port 1 View  Result Date: 11/10/2018 CLINICAL DATA:  Status post VATS.  Decortication. EXAM: PORTABLE CHEST 1 VIEW COMPARISON:  11/09/2018, 11/08/2018 FINDINGS: Endotracheal tube with the tip 4 cm above the carina. Right jugular central venous catheter with the tip projecting over the SVC. Right lung volume loss. Two right-sided chest tubes. Trace right basilar pneumothorax. Right basilar pleuroparenchymal disease similar in appearance to the prior exam. Mild bilateral chronic interstitial thickening. Stable cardiomediastinal silhouette. No aggressive osseous lesion. IMPRESSION: 1. Endotracheal tube with the tip 4 cm above the carina. Right jugular central venous catheter with the tip projecting over the SVC 2. Two right-sided chest tubes with a small right basilar loculated pneumothorax. Persistent right basilar pleuroparenchymal disease. Electronically Signed   By: Elige Ko   On: 11/10/2018 08:55   Dg Chest Port 1 View  Result Date: 11/09/2018 CLINICAL DATA:  Pneumothorax EXAM: PORTABLE CHEST 1 VIEW COMPARISON:  Portable exam 1304 hours compared to 0645 hours FINDINGS: Tip of endotracheal tube projects 3.2 cm above carina. RIGHT jugular central venous catheter with tip projecting over SVC. Interval removal of RIGHT basilar pigtail thoracostomy tube with placement of 2 new RIGHT thoracostomy tubes. Small loculated pneumothorax at lateral RIGHT lung base. Underlying emphysematous changes. Diffuse interstitial prominence throughout both lungs greater on RIGHT unchanged. Atelectasis of RIGHT lung base. Osseous demineralization. IMPRESSION: Placement of 2 new RIGHT thoracostomy tubes with persistent loculated pneumothorax and atelectasis at RIGHT lung base. 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 Electronically Signed   By: Ulyses Southward M.D.   On: 11/09/2018 13:25     Assessment/Plan: S/P Procedure(s) (LRB): REDO VIDEO ASSISTED THORACOSCOPY (VATS)/DECORTICATION/DRAIN EFFUSION (Right) VIDEO BRONCHOSCOPY (N/A) Expected Acute  Blood - loss Anemia- continue to monitor  Currently remains on the ventilator but weaning hope to extubate today Chest x-ray shows some improvement with the loculated pneumothorax Leave chest tubes in for now   Delight Ovens 11/10/2018 9:50 AM

## 2018-11-10 NOTE — Op Note (Signed)
NAME: WILDE, SPOFFORD MEDICAL RECORD [id number] ACCOUNT [id number] DATE OF BIRTH:Jun 21, 1940 FACILITY: [facility] LOCATION: [facility] PHYSICIAN:Brena Windsor Lars Pinks, MD  OPERATIVE REPORT  DATE OF PROCEDURE:  11/09/2018  PREOPERATIVE DIAGNOSIS:  Empyema, post right lower lobectomy.  POSTOPERATIVE DIAGNOSIS:  Empyema, post right lower lobectomy.  PROCEDURE:  Bronchoscopy, redo right thoracotomy, drainage of empyema, visceral and parietal pleural decortication.  SURGEON:  Charlett Lango, MD  ASSISTANT:  Doree Fudge, PA-C  ANESTHESIA:  General.  FINDINGS:  Bronchoscopy- bronchial stump appeared well healed.  Thick secretions in right middle and right upper lobe bronchi.  Empyema right pleural space with dense fibrous pleural peel.  Incomplete reexpansion at the posterolateral base of right lung.  CLINICAL NOTE:  William Kirk is a 78 year old gentleman who underwent a right lower lobectomy for chronic bronchiectasis with recurrent pneumonias and recurrent hemoptysis.  That procedure was done on 10/18/2018.  He presented back to the hospital on  11/06/2018 with increasing right-sided chest pain and shortness of breath.  A CT of the chest showed a complex pleural effusion.  He had foul-smelling drainage from the wound.  A pigtail catheter was placed, and he was started on intravenous antibiotics.   The pigtail drained the majority of the fluid, but there was still incomplete reexpansion of the right lung with a basilar space.  The fluid was frankly purulent.  The patient was advised to undergo a redo right thoracotomy for drainage of the empyema  and decortication of the lung.  The indications, risks, benefits, and alternatives were discussed in detail with the patient.  He understood and accepted the risks and agreed to proceed.  OPERATIVE NOTE:  The patient was brought to the preoperative holding area on 11/09/2018.  The anesthesia service established central venous access and  placed an arterial blood pressure monitoring line.  He was taken to the operating room, anesthetized, and  intubated.  A timeout was performed.  Flexible fiberoptic bronchoscopy was performed via the endotracheal tube.  It revealed normal endobronchial anatomy with no endobronchial lesions on the left.  On the right, the right upper and middle lobe bronchi  were widely patent.  There were thick clear secretions.  The bronchial stump of the right lower lobe appeared well healed.  The bronchoscope was removed.  A Foley catheter was placed.  Sequential compression devices were placed for DVT prophylaxis.  The patient was already receiving scheduled intravenous antibiotics.  He was placed in a left lateral decubitus position, and the right chest was prepped and  draped in the usual sterile fashion.  Single-lung ventilation of the left lung was initiated and was tolerated well throughout the procedure.  A second timeout was performed.  The patient's previous incision was reopened, as were the previous chest tube  sites.  There was pus in the incision, which was evacuated.  A scope was placed through a port in one of the previous chest tube sites.  There was a frank empyema with extensive purulent fluid and an extensive pleural peel.  The fluid was all evacuated,  and attempts were then begun to decorticate the lung.  Over the anterior and lateral aspects of the lung, there was surprisingly a fibrous peel, but a plane could be developed and this peel was removed without significant difficulty.  There were some  small tears in the visceral pleura with removal of the peel.  More inferiorly along the diaphragm, the lung was densely adherent.  Some of the peel was removed from the diaphragm as well, but  in the majority of the area, the space between the diaphragm  and the lung could not be entered.  As the decortication progressed more posteriorly, there was extensive fibrous peel, and the lung was much more  difficult to decorticate.  There were multiple more significant pleural tears posteriorly.  Inferiorly and  laterally, there was an area of the lung that appeared to be relatively devascularized, and this was resected with a single firing of an Echelon stapler using a black cartridge.  The right lung was reopened and allowed to inflate with ventilation.  The  anterior and lateral portions inflated well, but more posteriorly, there was still significant restriction.  Additional decortication was done in that area as much as possible.  Some areas where the peel could not be removed from the lung were scored in  a cross-hatch fashion using electrocautery.  There was significant bleeding with the decortication.  The chest wall and the visible portion of the diaphragm were decorticated.  The chest was copiously irrigated with a total of about 5 L of saline.  An  additional port incision was made posteriorly during the posterior decortication for placement of the camera to allow better angle of view.  A 32-French Blake drain and 28-French Blake drain were placed through the preexisting chest tube sites and  secured with a #1 silk sutures.  The new posterior incision was closed in standard fashion with a subcuticular suture.  The thoracotomy incision was closed with a running #1 Vicryl fascial suture, 2-0 Vicryl subcutaneous suture, and skin staples.  All  sponge, needle, and instrument counts were correct at the end of the procedure.  The patient was placed back in a supine position.  He was reintubated with a single-lumen endotracheal tube.  The chest tubes were placed to suction.  He was then taken from  the operating room to the postanesthetic care unit intubated and in stable condition.  LN/NUANCE  D:11/09/2018 T:11/10/2018 JOB:[id number]

## 2018-11-10 NOTE — Plan of Care (Signed)
  Problem: Education: Goal: Knowledge of General Education information will improve Description: Including pain rating scale, medication(s)/side effects and non-pharmacologic comfort measures Outcome: Progressing   Problem: Health Behavior/Discharge Planning: Goal: Ability to manage health-related needs will improve Outcome: Progressing   Problem: Clinical Measurements: Goal: Ability to maintain clinical measurements within normal limits will improve Outcome: Progressing Goal: Will remain free from infection Outcome: Progressing Goal: Diagnostic test results will improve Outcome: Progressing Goal: Respiratory complications will improve Outcome: Progressing Goal: Cardiovascular complication will be avoided Outcome: Progressing   Problem: Coping: Goal: Level of anxiety will decrease Outcome: Progressing   Problem: Elimination: Goal: Will not experience complications related to bowel motility Outcome: Progressing Goal: Will not experience complications related to urinary retention Outcome: Progressing   Problem: Pain Managment: Goal: General experience of comfort will improve Outcome: Progressing   Problem: Safety: Goal: Ability to remain free from injury will improve Outcome: Progressing   Problem: Skin Integrity: Goal: Risk for impaired skin integrity will decrease Outcome: Progressing   Problem: Activity: Goal: Risk for activity intolerance will decrease Outcome: Not Progressing Note: Patient remains very weak.   Problem: Nutrition: Goal: Adequate nutrition will be maintained Outcome: Not Progressing Note: Remains NPO at this time.  Possible increase to diet this afternoon.

## 2018-11-10 NOTE — Progress Notes (Signed)
Patient ID: William Kirk, male   DOB: 1940/04/01, 78 y.o.   MRN: [id number] EVENING ROUNDS NOTE :     [address]       [place] [address]             [phone]                 1 Day Post-Op Procedure(s) (LRB): REDO VIDEO ASSISTED THORACOSCOPY (VATS)/DECORTICATION/DRAIN EFFUSION (Right) VIDEO BRONCHOSCOPY (N/A)  Total Length of Stay:  LOS: 4 days  BP (!) 123/54   Pulse (!) 56   Temp 97.8 F (36.6 C) (Oral)   Resp 18   Ht 5\' 11"  (1.803 m)   Wt 64.9 kg   SpO2 93%   BMI 19.96 kg/m   .Intake/Output      10/02 0701 - 10/03 0700 10/03 0701 - 10/04 0700   P.O.     I.V. (mL/kg) 2294.7 (35.4) 462.9 (7.1)   Blood 945    IV Piggyback 699.9 221.8   Total Intake(mL/kg) 3939.6 (60.7) 684.7 (10.6)   Urine (mL/kg/hr) 1220 (0.8) 445 (0.6)   Blood 1200    Chest Tube 500 210   Total Output 2920 655   Net +1019.6 +29.7          . sodium chloride    . dextrose 5 % and 0.45% NaCl 75 mL/hr at 11/10/18 1658  . meropenem (MERREM) IV 1 g (11/10/18 1442)  . propofol (DIPRIVAN) infusion Stopped (11/10/18 1100)  . vancomycin Stopped (11/10/18 1047)     Lab Results  Component Value Date   WBC 15.5 (H) 11/10/2018   HGB 11.9 (L) 11/10/2018   HCT 35.0 (L) 11/10/2018   PLT 359 11/10/2018   GLUCOSE 82 11/10/2018   TRIG 67 11/09/2018   ALT 8 10/20/2018   AST 23 10/20/2018   NA 143 11/10/2018   K 4.4 11/10/2018   CL 112 (H) 11/10/2018   CREATININE 1.18 11/10/2018   BUN 14 11/10/2018   CO2 21 (L) 11/10/2018   INR 1.4 (H) 11/08/2018   Slight hoarseness, previous history of swallowing difficulty modified barium swallow in am Off ventilator, resp status stable    Grace Isaac MD  Beeper [phone] Office [phone] 11/10/2018 6:14 PM

## 2018-11-10 NOTE — Evaluation (Signed)
Clinical/Bedside Swallow Evaluation Patient Details  Name: William Kirk MRN: [id number] Date of Birth: May 16, 1940  Today's Date: 11/10/2018 Time: SLP Start Time (ACUTE ONLY): 1655 SLP Stop Time (ACUTE ONLY): 1710 SLP Time Calculation (min) (ACUTE ONLY): 15 min  Past Medical History:  Past Medical History:  Diagnosis Date  . Anxiety   . Arthritis    "hands" (09/21/2016)  . Bleeding stomach ulcer 2000s  . COPD (chronic obstructive pulmonary disease) (HCC)   . Cough   . Depression   . Dyspnea   . Hypertension   . Pneumonia    "now and several times before this" (09/21/2016)  . PVD (peripheral vascular disease) (HCC) 2014   prev PTCA on RLE   Past Surgical History:  Past Surgical History:  Procedure Laterality Date  . IR ANGIOGRAM SELECTIVE EACH ADDITIONAL VESSEL  07/26/2018  . IR ANGIOGRAM SELECTIVE EACH ADDITIONAL VESSEL  07/26/2018  . IR ANGIOGRAM SELECTIVE EACH ADDITIONAL VESSEL  07/26/2018  . IR EMBO ART  VEN HEMORR LYMPH EXTRAV  INC GUIDE ROADMAPPING  07/26/2018  . IR US GUIDE VASC ACCESS RIGHT  07/26/2018  . JOINT REPLACEMENT    . KNEE ARTHROSCOPY Right X 1  . TOTAL HIP ARTHROPLASTY Right 2009  . VIDEO ASSISTED THORACOSCOPY (VATS)/ LOBECTOMY Right 10/18/2018   Procedure: VIDEO ASSISTED THORACOSCOPY (VATS)/ right lower LOBECTOMY;  Surgeon: Loreli Slot, MD;  Location: [facility] OR;  Service: Thoracic;  Laterality: Right;  Marland Kitchen VIDEO ASSISTED THORACOSCOPY (VATS)/DECORTICATION Right 11/09/2018   Procedure: REDO VIDEO ASSISTED THORACOSCOPY (VATS)/DECORTICATION/DRAIN EFFUSION;  Surgeon: Loreli Slot, MD;  Location: [facility] OR;  Service: Thoracic;  Laterality: Right;  Marland Kitchen VIDEO BRONCHOSCOPY N/A 07/27/2018   Procedure: VIDEO BRONCHOSCOPY WITHOUT FLUORO;  Surgeon: Leslye Peer, MD;  Location: [facility] OR;  Service: Cardiopulmonary;  Laterality: N/A;  . VIDEO BRONCHOSCOPY N/A 11/09/2018   Procedure: VIDEO BRONCHOSCOPY;  Surgeon: Loreli Slot, MD;  Location: [facility] OR;  Service:  Thoracic;  Laterality: N/A;   HPI:  Patient is a 78 y.o. male with PMH: COPD, tobacco abuse, recurrent PNA, bronchiectasis, recurrent hemoptysis.  He has had multiple recurrent PNA's over the past several years. He underwent a right lower lobectomy on 9/10. He presented to hospital on 9/29 with increasing right sided chest pain and SOB. CT revealed complex pleural effusion. He underwent a redo right thoractomy for drainage of the empyema. He was intubated for procedure on 10/2 and extubated on 10/3 at 1230. He has history of dysphagia with 2018 MBS report recommending nectar thick liquids and chin tuck with swallow.   Assessment / Plan / Recommendation Clinical Impression  Patient presents with a mild-moderate pharyngeal dysphagia with delayed coughing and decreased laryngeal elevation and pharyngeal contraction per palpation. Patient has h/o dysphagia with 2018 MBS recommending Dys 3 mechanical soft solids and nectar thick liquids with chin tuck posture. Patient has had recurrent PNA's and is at high risk for aspiration.He will need an objective swallow study prior to initaiting any PO diet. He is safe for small amounts of ice chips or water sips after oral care. SLP Visit Diagnosis: Dysphagia, unspecified (R13.10)    Aspiration Risk  Moderate aspiration risk    Diet Recommendation NPO;Ice chips PRN after oral care   Medication Administration: Via alternative means Postural Changes: Seated upright at 90 degrees    Other  Recommendations Oral Care Recommendations: Oral care BID   Follow up Recommendations Other (comment)(TBD)      Frequency and Duration min 2x/week  1 week  Prognosis   good     Swallow Study   General Date of Onset: 11/06/18 HPI: Patient is a 78 y.o. male with PMH: COPD, tobacco abuse, recurrent PNA, bronchiectasis, recurrent hemoptysis.  He has had multiple recurrent PNA's over the past several years. He underwent a right lower lobectomy on 9/10. He presented  to hospital on 9/29 with increasing right sided chest pain and SOB. CT revealed complex pleural effusion. He underwent a redo right thoractomy for drainage of the empyema. He was intubated for procedure on 10/2 and extubated on 10/3 at 1230. He has history of dysphagia with 2018 MBS report recommending nectar thick liquids and chin tuck with swallow. Type of Study: Bedside Swallow Evaluation Previous Swallow Assessment: 2018 MBS Diet Prior to this Study: NPO Temperature Spikes Noted: No Respiratory Status: Nasal cannula History of Recent Intubation: Yes Length of Intubations (days): 2 days Date extubated: 11/10/18 Behavior/Cognition: Alert;Cooperative;Pleasant mood Oral Cavity Assessment: Within Functional Limits Oral Care Completed by SLP: No Oral Cavity - Dentition: Adequate natural dentition Vision: Functional for self-feeding Self-Feeding Abilities: Able to feed self Patient Positioning: Upright in bed Baseline Vocal Quality: Hoarse;Low vocal intensity Volitional Cough: Weak Volitional Swallow: Able to elicit    Oral/Motor/Sensory Function Overall Oral Motor/Sensory Function: Within functional limits   Ice Chips     Thin Liquid Thin Liquid: Impaired Presentation: Cup;Self Fed Pharyngeal  Phase Impairments: Suspected delayed Swallow;Decreased hyoid-laryngeal movement;Throat Clearing - Delayed Other Comments: patient endorses pain when swallowing "when I swallow too fast"    Nectar Thick     Honey Thick     Puree Puree: Not tested   Solid     Solid: Not tested      Pablo Lawrence 11/10/2018,6:30 PM  Angela Nevin, MA, CCC-SLP Speech Therapy [facility] Acute Rehab Pager: [phone]

## 2018-11-10 NOTE — Procedures (Signed)
Extubation Procedure Note  Patient Details:   Name: CHRISTEN TETTERTON DOB: 1941-01-24 MRN: [id number]   Airway Documentation:    Vent end date: 11/10/18 Vent end time: 1230   Evaluation  O2 sats: stable throughout Complications: No apparent complications Patient did tolerate procedure well. Bilateral Breath Sounds: Clear   Yes, pt could speak post extubation.  Pt extubated to 4 l/m [place] per protocol.  Earney Navy 11/10/2018, 12:31 PM

## 2018-11-11 ENCOUNTER — Inpatient Hospital Stay (HOSPITAL_COMMUNITY): Payer: Medicare HMO

## 2018-11-11 LAB — CBC
HCT: 30.9 % — ABNORMAL LOW (ref 39.0–52.0)
Hemoglobin: 9.9 g/dL — ABNORMAL LOW (ref 13.0–17.0)
MCH: 28.1 pg (ref 26.0–34.0)
MCHC: 32 g/dL (ref 30.0–36.0)
MCV: 87.8 fL (ref 80.0–100.0)
Platelets: 397 10*3/uL (ref 150–400)
RBC: 3.52 MIL/uL — ABNORMAL LOW (ref 4.22–5.81)
RDW: 17.3 % — ABNORMAL HIGH (ref 11.5–15.5)
WBC: 12.8 10*3/uL — ABNORMAL HIGH (ref 4.0–10.5)
nRBC: 0 % (ref 0.0–0.2)

## 2018-11-11 LAB — COMPREHENSIVE METABOLIC PANEL
ALT: 28 U/L (ref 0–44)
AST: 25 U/L (ref 15–41)
Albumin: 1.4 g/dL — ABNORMAL LOW (ref 3.5–5.0)
Alkaline Phosphatase: 80 U/L (ref 38–126)
Anion gap: 7 (ref 5–15)
BUN: 12 mg/dL (ref 8–23)
CO2: 24 mmol/L (ref 22–32)
Calcium: 7.5 mg/dL — ABNORMAL LOW (ref 8.9–10.3)
Chloride: 108 mmol/L (ref 98–111)
Creatinine, Ser: 0.97 mg/dL (ref 0.61–1.24)
GFR calc Af Amer: >60 mL/min (ref >60)
GFR calc non Af Amer: >60 mL/min (ref >60)
Glucose, Bld: 101 mg/dL — ABNORMAL HIGH (ref 70–99)
Potassium: 3.8 mmol/L (ref 3.5–5.1)
Sodium: 139 mmol/L (ref 135–145)
Total Bilirubin: 0.3 mg/dL (ref 0.3–1.2)
Total Protein: 4.9 g/dL — ABNORMAL LOW (ref 6.5–8.1)

## 2018-11-11 LAB — AEROBIC/ANAEROBIC CULTURE W GRAM STAIN (SURGICAL/DEEP WOUND)

## 2018-11-11 LAB — GLUCOSE, CAPILLARY
Glucose-Capillary: 83 mg/dL (ref 70–99)
Glucose-Capillary: 86 mg/dL (ref 70–99)
Glucose-Capillary: 90 mg/dL (ref 70–99)
Glucose-Capillary: 93 mg/dL (ref 70–99)

## 2018-11-11 LAB — VANCOMYCIN, PEAK: Vancomycin Pk: 32 ug/mL (ref 30–40)

## 2018-11-11 LAB — VANCOMYCIN, TROUGH: Vancomycin Tr: 11 ug/mL — ABNORMAL LOW (ref 15–20)

## 2018-11-11 MED ORDER — FOLIC ACID 5 MG/ML IJ SOLN
1.0000 mg | Freq: Once | INTRAMUSCULAR | Status: AC
  Administered 2018-11-11: 14:00:00 1 mg via INTRAVENOUS
  Filled 2018-11-11: qty 0.2

## 2018-11-11 MED ORDER — AMIODARONE HCL IN DEXTROSE 360-4.14 MG/200ML-% IV SOLN
30.0000 mg/h | INTRAVENOUS | Status: DC
  Administered 2018-11-11 – 2018-11-17 (×10): 30 mg/h via INTRAVENOUS
  Filled 2018-11-11 (×14): qty 200

## 2018-11-11 NOTE — Plan of Care (Signed)
  Problem: Education: Goal: Knowledge of General Education information will improve Description: Including pain rating scale, medication(s)/side effects and non-pharmacologic comfort measures Outcome: Progressing   Problem: Health Behavior/Discharge Planning: Goal: Ability to manage health-related needs will improve Outcome: Progressing   Problem: Clinical Measurements: Goal: Ability to maintain clinical measurements within normal limits will improve Outcome: Progressing Goal: Will remain free from infection Outcome: Progressing Goal: Diagnostic test results will improve Outcome: Progressing Goal: Respiratory complications will improve Outcome: Progressing Goal: Cardiovascular complication will be avoided Outcome: Progressing   Problem: Activity: Goal: Risk for activity intolerance will decrease Outcome: Progressing   Problem: Coping: Goal: Level of anxiety will decrease Outcome: Progressing   Problem: Elimination: Goal: Will not experience complications related to bowel motility Outcome: Progressing Goal: Will not experience complications related to urinary retention Outcome: Progressing   Problem: Pain Managment: Goal: General experience of comfort will improve Outcome: Progressing   Problem: Safety: Goal: Ability to remain free from injury will improve Outcome: Progressing   Problem: Skin Integrity: Goal: Risk for impaired skin integrity will decrease Outcome: Progressing   Problem: Nutrition: Goal: Adequate nutrition will be maintained Outcome: Not Progressing Note: Patient NPO failed MBS this shift.  Order for cortract to be placed and nutrition consult.

## 2018-11-11 NOTE — Progress Notes (Signed)
Paged Dr Servando Snare regarding patient chest to with a large air leak.  MD asked about suction being hooked up and if dressing and tubes were checked.  Writer informed that all these things had been done by multiple RNs prior to paging him.  Stated that he was on the unit and rounding at this time.

## 2018-11-11 NOTE — Progress Notes (Signed)
Pharmacy Antibiotic Note  William Kirk is a 78 y.o. male admitted on 11/06/2018 with wound infection. S/p VATS lobectomy with Kleb.  Pharmacy has been consulted for vancomycin and meropenem dosing. Pt is afebrile and WBC was elevated at 29.> improved 13. Scr is WNL at 1.09 and lactic acid is normal. VT 11, VP 32 AUC at goal 467 (400-600)  Plan: Continue Vancomycin  1250mg  IV Q24H Continue meropenem 1gm q8h  Height: 5\' 11"  (180.3 cm) Weight: 143 lb 1.3 oz (64.9 kg) IBW/kg (Calculated) : 75.3  Temp (24hrs), Avg:98 F (36.7 C), Min:97 F (36.1 C), Max:98.9 F (37.2 C)  Recent Labs  Lab 11/06/18 0542 11/07/18 0249 11/08/18 0210 11/09/18 0152 11/10/18 0501 11/11/18 0508 11/11/18 0850 11/11/18 1230  WBC 29.3* 18.3* 16.3* 18.0* 15.5* 12.8*  --   --   CREATININE 1.09 1.12 1.05  --  1.18 0.97  --   --   LATICACIDVEN 0.9  --   --   --   --   --   --   --   VANCOTROUGH  --   --   --   --   --   --  11*  --   VANCOPEAK  --   --   --   --   --   --   --  32    Estimated Creatinine Clearance: 59.5 mL/min (by C-G formula based on SCr of 0.97 mg/dL).    No Known Allergies  Antimicrobials this admission: Vanc 9/29>> Zosyn 9/29>>10/2 Meropenem 10/2>  Dose adjustments this admission: N/A  Microbiology results: Pleural fluid Kleb ESBL - Senstive to meropenem  And Streptococcus constellatus S to Vancomycin   Bonnita Nasuti Pharm.D. CPP, BCPS Clinical Pharmacist [phone] 11/11/2018 3:49 PM

## 2018-11-11 NOTE — Progress Notes (Signed)
Patient ID: William Kirk, male   DOB: April 08, 1940, 78 y.o.   MRN: [id number] EVENING ROUNDS NOTE :     [address]       [place] [address]             [phone]                 2 Days Post-Op Procedure(s) (LRB): REDO VIDEO ASSISTED THORACOSCOPY (VATS)/DECORTICATION/DRAIN EFFUSION (Right) VIDEO BRONCHOSCOPY (N/A)  Total Length of Stay:  LOS: 5 days  BP 140/61   Pulse 63   Temp 97.7 F (36.5 C) (Oral)   Resp 15   Ht 5\' 11"  (1.803 m)   Wt 64.9 kg   SpO2 95%   BMI 19.96 kg/m   .Intake/Output      10/04 0701 - 10/05 0700   I.V. (mL/kg) 981 (15.1)   IV Piggyback 350.1   Total Intake(mL/kg) 1331.1 (20.5)   Urine (mL/kg/hr) 2355 (2.9)   Chest Tube 100   Total Output 2455   Net -1123.9         . sodium chloride    . amiodarone 30 mg/hr (11/11/18 1900)  . dextrose 5 % and 0.45% NaCl 75 mL/hr at 11/11/18 1900  . meropenem (MERREM) IV Stopped (11/11/18 1409)  . vancomycin Stopped (11/11/18 1200)     Lab Results  Component Value Date   WBC 12.8 (H) 11/11/2018   HGB 9.9 (L) 11/11/2018   HCT 30.9 (L) 11/11/2018   PLT 397 11/11/2018   GLUCOSE 101 (H) 11/11/2018   TRIG 67 11/09/2018   ALT 28 11/11/2018   AST 25 11/11/2018   NA 139 11/11/2018   K 3.8 11/11/2018   CL 108 11/11/2018   CREATININE 0.97 11/11/2018   BUN 12 11/11/2018   CO2 24 11/11/2018   INR 1.4 (H) 11/08/2018    Waiting for feeding tube , aspiration on swallow test Foley to come out  Grace Isaac MD  Beeper [phone] Office [phone] 11/11/2018 7:31 PM

## 2018-11-11 NOTE — Progress Notes (Signed)
Modified Barium Swallow Progress Note  Patient Details  Name: William Kirk MRN: [id number] Date of Birth: 1940/06/10  Today's Date: 11/11/2018  Modified Barium Swallow completed.  Full report located under Chart Review in the Imaging Section.  Brief recommendations include the following:  Clinical Impression  Patient presents with a moderate-severe pharyngeal phase dysphagia characterized primarily by laryngeal weakness resulting in decreased laryngeal closure with subsequent deep penetration and aspiration (combination of silent and sensed) of all pos trialed. Honey thick and nectar thick liquids aspirated during the swallow and pureed pharyngeal residuals, atlhough relatively mild, mix with secretions and are aspirated post swallow. Suspect deficits are a combination of exacerbated baseline dysphagia, brief intubation, and general deconditioning. Discussed with patient and RN. Recommend NPO except ice chips after oral care, with potential f/u MBS in 2-3 days to assess for improvement.    Swallow Evaluation Recommendations       SLP Diet Recommendations: NPO;Alternative means - temporary;Ice chips PRN after oral care       Medication Administration: Via alternative means               Oral Care Recommendations: Oral care QID       Kasem Mozer MA, CCC-SLP   Anaston Koehn Meryl 11/11/2018,12:32 PM

## 2018-11-11 NOTE — Progress Notes (Signed)
Patient ID: William Kirk, male   DOB: Oct 17, 1940, 78 y.o.   MRN: [id number] TCTS DAILY ICU PROGRESS NOTE                   [address]            [place] [address]          [phone]   2 Days Post-Op Procedure(s) (LRB): REDO VIDEO ASSISTED THORACOSCOPY (VATS)/DECORTICATION/DRAIN EFFUSION (Right) VIDEO BRONCHOSCOPY (N/A)  Total Length of Stay:  LOS: 5 days   Subjective: Patient up to chair, respiratory status is stable, during the night nurses noted increase in the right chest tube airleak  Objective: Vital signs in last 24 hours: Temp:  [97.7 F (36.5 C)-98.9 F (37.2 C)] 97.7 F (36.5 C) (10/04 0744) Pulse Rate:  [53-66] 64 (10/04 0840) Cardiac Rhythm: Normal sinus rhythm (10/04 0800) Resp:  [15-18] 17 (10/04 0840) BP: (119-148)/(43-63) 148/59 (10/04 0800) SpO2:  [89 %-98 %] 98 % (10/04 0840) Arterial Line BP: (133-162)/(39-46) 162/46 (10/03 1800) FiO2 (%):  [40 %-98 %] 98 % (10/04 0840)  Filed Weights   11/06/18 1000 11/07/18 2116 11/08/18 0404  Weight: 67.5 kg 64.9 kg 64.9 kg    Weight change:    Hemodynamic parameters for last 24 hours:    Intake/Output from previous day: 10/03 0701 - 10/04 0700 In: 2340.6 [I.V.:1818.8; IV Piggyback:521.8] Out: 2135 [Urine:1645; Chest Tube:490]  Intake/Output this shift: Total I/O In: 74.4 [I.V.:74.4] Out: -   Current Meds: Scheduled Meds: . acetaminophen  650 mg Rectal Q6H  . amiodarone  200 mg Oral Daily  . atorvastatin  20 mg Oral QHS  . bisacodyl  10 mg Oral Daily  . Chlorhexidine Gluconate Cloth  6 each Topical Daily  . folic acid  1 mg Oral Daily  . gabapentin  300 mg Oral QHS  . insulin aspart  0-24 Units Subcutaneous Q6H  . metoprolol succinate  12.5 mg Oral Daily  . mometasone-formoterol  2 puff Inhalation BID  . multivitamin with minerals  1 tablet Oral Daily  . senna-docusate  1 tablet Oral QHS  . sertraline  200 mg Oral Daily  . sodium chloride flush  10-40 mL Intracatheter Q12H   . traZODone  50 mg Oral QHS  . umeclidinium bromide  1 puff Inhalation Daily  . vitamin B-12  1,000 mcg Oral Daily   Continuous Infusions: . sodium chloride    . dextrose 5 % and 0.45% NaCl 75 mL/hr at 11/11/18 0800  . meropenem (MERREM) IV Stopped (11/11/18 0536)  . propofol (DIPRIVAN) infusion Stopped (11/10/18 1100)  . vancomycin Stopped (11/10/18 1047)   PRN Meds:.Place/Maintain arterial line **AND** sodium chloride, diclofenac sodium, lidocaine, morphine injection, ondansetron (ZOFRAN) IV, oxyCODONE, sodium chloride flush, traMADol  General appearance: alert, cooperative and no distress Neurologic: intact Heart: regular rate and rhythm, S1, S2 normal, no murmur, click, rub or gallop Lungs: diminished breath sounds RLL and RML Abdomen: soft, non-tender; bowel sounds normal; no masses,  no organomegaly Extremities: extremities normal, atraumatic, no cyanosis or edema and Homans sign is negative, no sign of DVT Wound: 3+ continuous air leak on suction Chest tubes inspected free of loose connections  Lab Results: CBC: Recent Labs    11/10/18 0501 11/10/18 1223 11/11/18 0508  WBC 15.5*  --  12.8*  HGB 9.5* 11.9* 9.9*  HCT 29.4* 35.0* 30.9*  PLT 359  --  397   BMET:  Recent Labs    11/10/18 0501 11/10/18 1223  11/11/18 0508  NA 140 143 139  K 4.2 4.4 3.8  CL 112*  --  108  CO2 21*  --  24  GLUCOSE 82  --  101*  BUN 14  --  12  CREATININE 1.18  --  0.97  CALCIUM 7.3*  --  7.5*    CMET: Lab Results  Component Value Date   WBC 12.8 (H) 11/11/2018   HGB 9.9 (L) 11/11/2018   HCT 30.9 (L) 11/11/2018   PLT 397 11/11/2018   GLUCOSE 101 (H) 11/11/2018   TRIG 67 11/09/2018   ALT 28 11/11/2018   AST 25 11/11/2018   NA 139 11/11/2018   K 3.8 11/11/2018   CL 108 11/11/2018   CREATININE 0.97 11/11/2018   BUN 12 11/11/2018   CO2 24 11/11/2018   INR 1.4 (H) 11/08/2018      PT/INR:  Recent Labs    11/08/18 2024  LABPROT 16.9*  INR 1.4*   Radiology: Dg  Chest Port 1 View  Result Date: 11/11/2018 CLINICAL DATA:  Chest tube status. EXAM: PORTABLE CHEST 1 VIEW COMPARISON:  11/10/2018 FINDINGS: Patient rotated to the right. Two right-sided chest tubes and right IJ central venous catheter unchanged. Possible small residual loculated pneumothorax lateral right base. Stable opacification over the right base likely atelectasis with small amount of pleural fluid. Stable volume loss of the right lung. Remainder the exam is unchanged. IMPRESSION: Stable right base opacification likely small effusion with atelectasis. Stable volume loss right lung. Improvement in possible small residual loculated pneumothorax lateral right base. Tubes and lines unchanged. Electronically Signed   By: Elberta Fortis M.D.   On: 11/11/2018 09:11     Assessment/Plan: S/P Procedure(s) (LRB): REDO VIDEO ASSISTED THORACOSCOPY (VATS)/DECORTICATION/DRAIN EFFUSION (Right) VIDEO BRONCHOSCOPY (N/A) Mobilize Cough with taking p.o. yesterday, for swallowing evaluation today Chest x-ray is stable this morning, increased air leak noted, without significant pneumothorax we will try a chest tube on waterseal only    Delight Ovens 11/11/2018 10:04 AM

## 2018-11-11 NOTE — Progress Notes (Signed)
While ambulated to chair Chest tube air leak became audible reaching chamber 6 of air leak chamber. Chest Xray done. Servando Snare, MD notified. Morning inhaler given early per Dr. Servando Snare. Lungs wheezy and rhonchus. Dressing redressed with minimal drainage noted. O2 sat 95%. Will continue to monitor.

## 2018-11-12 ENCOUNTER — Inpatient Hospital Stay (HOSPITAL_COMMUNITY): Payer: Medicare HMO

## 2018-11-12 LAB — GLUCOSE, CAPILLARY
Glucose-Capillary: 115 mg/dL — ABNORMAL HIGH (ref 70–99)
Glucose-Capillary: 79 mg/dL (ref 70–99)
Glucose-Capillary: 86 mg/dL (ref 70–99)
Glucose-Capillary: 88 mg/dL (ref 70–99)
Glucose-Capillary: 90 mg/dL (ref 70–99)
Glucose-Capillary: 93 mg/dL (ref 70–99)
Glucose-Capillary: 98 mg/dL (ref 70–99)

## 2018-11-12 LAB — BASIC METABOLIC PANEL
Anion gap: 9 (ref 5–15)
BUN: 8 mg/dL (ref 8–23)
CO2: 28 mmol/L (ref 22–32)
Calcium: 8.1 mg/dL — ABNORMAL LOW (ref 8.9–10.3)
Chloride: 102 mmol/L (ref 98–111)
Creatinine, Ser: 0.89 mg/dL (ref 0.61–1.24)
GFR calc Af Amer: >60 mL/min (ref >60)
GFR calc non Af Amer: >60 mL/min (ref >60)
Glucose, Bld: 97 mg/dL (ref 70–99)
Potassium: 3.2 mmol/L — ABNORMAL LOW (ref 3.5–5.1)
Sodium: 139 mmol/L (ref 135–145)

## 2018-11-12 LAB — TRIGLYCERIDES: Triglycerides: 123 mg/dL (ref <150)

## 2018-11-12 LAB — PHOSPHORUS: Phosphorus: 1.9 mg/dL — ABNORMAL LOW (ref 2.5–4.6)

## 2018-11-12 LAB — CBC
HCT: 32.3 % — ABNORMAL LOW (ref 39.0–52.0)
Hemoglobin: 10.5 g/dL — ABNORMAL LOW (ref 13.0–17.0)
MCH: 28.1 pg (ref 26.0–34.0)
MCHC: 32.5 g/dL (ref 30.0–36.0)
MCV: 86.4 fL (ref 80.0–100.0)
Platelets: 410 10*3/uL — ABNORMAL HIGH (ref 150–400)
RBC: 3.74 MIL/uL — ABNORMAL LOW (ref 4.22–5.81)
RDW: 16.8 % — ABNORMAL HIGH (ref 11.5–15.5)
WBC: 8.7 10*3/uL (ref 4.0–10.5)
nRBC: 0 % (ref 0.0–0.2)

## 2018-11-12 LAB — SURGICAL PATHOLOGY

## 2018-11-12 LAB — MAGNESIUM: Magnesium: 1.6 mg/dL — ABNORMAL LOW (ref 1.7–2.4)

## 2018-11-12 MED ORDER — PRO-STAT SUGAR FREE PO LIQD
30.0000 mL | Freq: Two times a day (BID) | ORAL | Status: DC
  Administered 2018-11-12 – 2018-11-22 (×20): 30 mL
  Filled 2018-11-12 (×21): qty 30

## 2018-11-12 MED ORDER — ATORVASTATIN CALCIUM 10 MG PO TABS
20.0000 mg | ORAL_TABLET | Freq: Every day | ORAL | Status: DC
  Administered 2018-11-12 – 2018-11-21 (×10): 20 mg
  Filled 2018-11-12 (×10): qty 2

## 2018-11-12 MED ORDER — ENOXAPARIN SODIUM 40 MG/0.4ML ~~LOC~~ SOLN
40.0000 mg | SUBCUTANEOUS | Status: DC
  Administered 2018-11-12 – 2018-11-22 (×11): 40 mg via SUBCUTANEOUS
  Filled 2018-11-12 (×11): qty 0.4

## 2018-11-12 MED ORDER — VITAMIN B-12 1000 MCG PO TABS
1000.0000 ug | ORAL_TABLET | Freq: Every day | ORAL | Status: DC
  Administered 2018-11-13 – 2018-11-22 (×10): 1000 ug
  Filled 2018-11-12 (×10): qty 1

## 2018-11-12 MED ORDER — POTASSIUM CHLORIDE 10 MEQ/50ML IV SOLN
10.0000 meq | INTRAVENOUS | Status: AC
  Administered 2018-11-12 (×2): 10 meq via INTRAVENOUS
  Filled 2018-11-12: qty 50

## 2018-11-12 MED ORDER — OSMOLITE 1.5 CAL PO LIQD
1000.0000 mL | ORAL | Status: DC
  Administered 2018-11-12 – 2018-11-20 (×8): 1000 mL
  Filled 2018-11-12 (×14): qty 1000

## 2018-11-12 MED ORDER — SENNOSIDES 8.8 MG/5ML PO SYRP
5.0000 mL | ORAL_SOLUTION | Freq: Every day | ORAL | Status: DC
  Administered 2018-11-12 – 2018-11-21 (×9): 5 mL
  Filled 2018-11-12 (×11): qty 5

## 2018-11-12 MED ORDER — TRAZODONE HCL 50 MG PO TABS
50.0000 mg | ORAL_TABLET | Freq: Every day | ORAL | Status: DC
  Administered 2018-11-12 – 2018-11-21 (×10): 50 mg
  Filled 2018-11-12 (×10): qty 1

## 2018-11-12 MED ORDER — SERTRALINE HCL 100 MG PO TABS
200.0000 mg | ORAL_TABLET | Freq: Every day | ORAL | Status: DC
  Administered 2018-11-13 – 2018-11-22 (×10): 200 mg
  Filled 2018-11-12 (×11): qty 2

## 2018-11-12 MED ORDER — VITAL HIGH PROTEIN PO LIQD: 1000.0000 mL | ORAL | Status: DC

## 2018-11-12 MED ORDER — POTASSIUM CHLORIDE 10 MEQ/50ML IV SOLN
10.0000 meq | INTRAVENOUS | Status: AC
  Administered 2018-11-12 (×3): 10 meq via INTRAVENOUS
  Filled 2018-11-12: qty 50

## 2018-11-12 MED ORDER — ADULT MULTIVITAMIN W/MINERALS CH
1.0000 | ORAL_TABLET | Freq: Every day | ORAL | Status: DC
  Administered 2018-11-13 – 2018-11-14 (×2): 1
  Filled 2018-11-12 (×2): qty 1

## 2018-11-12 MED ORDER — GABAPENTIN 250 MG/5ML PO SOLN
300.0000 mg | Freq: Every day | ORAL | Status: DC
  Administered 2018-11-12 – 2018-11-21 (×10): 300 mg
  Filled 2018-11-12 (×13): qty 6

## 2018-11-12 MED ORDER — DOCUSATE SODIUM 50 MG/5ML PO LIQD
100.0000 mg | Freq: Every day | ORAL | Status: DC
  Administered 2018-11-14 – 2018-11-21 (×8): 100 mg
  Filled 2018-11-12 (×9): qty 10

## 2018-11-12 MED ORDER — FOLIC ACID 1 MG PO TABS
1.0000 mg | ORAL_TABLET | Freq: Every day | ORAL | Status: DC
  Administered 2018-11-13 – 2018-11-22 (×10): 1 mg
  Filled 2018-11-12 (×10): qty 1

## 2018-11-12 NOTE — Progress Notes (Signed)
Speech Language Pathology Treatment: Dysphagia  Patient Details Name: William Kirk MRN: [id number] DOB: 06-Aug-1940 Today's Date: 11/12/2018 Time: 2542-7062 SLP Time Calculation (min) (ACUTE ONLY): 16 min  Assessment / Plan / Recommendation Clinical Impression  SLP provided therapeutic boluses of ice chips with coughing noted on approximately 50% of trials. He used the yankauer with Min cues to orally suction small amounts of secretions. His voice sounds mildly hoarse, and he believes that it is unchanged from previous date. He may be a good candidate for RMT in the future. Pt also denies using thickener at home PTA, stating that he had worked with an SLP at the [facility] to advance his diet safely. I'm hopeful that with additional time and reconditioning he will therefore be able to resume a PO diet, although perhaps with modifications. For now, would maintain NPO status except for a few ice chips at a time, given sparingly after oral care.   HPI HPI: Patient is a 78 y.o. male with PMH: COPD, tobacco abuse, recurrent PNA, bronchiectasis, recurrent hemoptysis.  He has had multiple recurrent PNA's over the past several years. He underwent a right lower lobectomy on 9/10. He presented to hospital on 9/29 with increasing right sided chest pain and SOB. CT revealed complex pleural effusion. He underwent a redo right thoractomy for drainage of the empyema. He was intubated for procedure on 10/2 and extubated on 10/3 at 1230. He has history of dysphagia with 2018 MBS report recommending nectar thick liquids and chin tuck with swallow.      SLP Plan  Continue with current plan of care       Recommendations  Diet recommendations: NPO;Other(comment)(few ice chips after oral care) Medication Administration: Via alternative means                Oral Care Recommendations: Oral care QID Follow up Recommendations: (tba) SLP Visit Diagnosis: Dysphagia, pharyngeal phase (R13.13) Plan: Continue  with current plan of care       GO                William Kirk 11/12/2018, 9:02 AM  Ivar Drape, M.A. CCC-SLP Acute Herbalist [phone] Office [phone]

## 2018-11-12 NOTE — Progress Notes (Signed)
  CT surgery p.m. Rounds  Patient resting comfortably Stable vital signs Minimal chest tube output Was up out of bed to chair today Tube feeds via gastric tube

## 2018-11-12 NOTE — Progress Notes (Signed)
3 Days Post-Op Procedure(s) (LRB): REDO VIDEO ASSISTED THORACOSCOPY (VATS)/DECORTICATION/DRAIN EFFUSION (Right) VIDEO BRONCHOSCOPY (N/A) Subjective: No complaints this AM Noted to be aspirating over the weekend, currently NPO  Objective: Vital signs in last 24 hours: Temp:  [96.7 F (35.9 C)-98.7 F (37.1 C)] 96.7 F (35.9 C) (10/05 0853) Pulse Rate:  [63-70] 70 (10/05 0700) Cardiac Rhythm: Normal sinus rhythm (10/05 0400) Resp:  [15] 15 (10/04 1537) BP: (138-156)/(59-96) 146/69 (10/05 0700) SpO2:  [90 %-99 %] 93 % (10/05 0700)  Hemodynamic parameters for last 24 hours:    Intake/Output from previous day: 10/04 0701 - 10/05 0700 In: 2622.5 [I.V.:2072; IV Piggyback:550.5] Out: 4401 [Urine:4180; Stool:1; Chest Tube:220] Intake/Output this shift: No intake/output data recorded.  General appearance: alert, cooperative and no distress Neurologic: intact Heart: regular rate and rhythm Lungs: diminished breath sounds bilaterally Abdomen: normal findings: soft, non-tender minimal air leak  Lab Results: Recent Labs    11/11/18 0508 11/12/18 0509  WBC 12.8* 8.7  HGB 9.9* 10.5*  HCT 30.9* 32.3*  PLT 397 410*   BMET:  Recent Labs    11/11/18 0508 11/12/18 0509  NA 139 139  K 3.8 3.2*  CL 108 102  CO2 24 28  GLUCOSE 101* 97  BUN 12 8  CREATININE 0.97 0.89  CALCIUM 7.5* 8.1*    PT/INR: No results for input(s): LABPROT, INR in the last 72 hours. ABG    Component Value Date/Time   PHART 7.374 11/10/2018 1223   HCO3 23.1 11/10/2018 1223   TCO2 24 11/10/2018 1223   ACIDBASEDEF 2.0 11/10/2018 1223   O2SAT 98.0 11/10/2018 1223   CBG (last 3)  Recent Labs    11/12/18 0001 11/12/18 0520 11/12/18 0849  GLUCAP 79 90 98    Assessment/Plan: S/P Procedure(s) (LRB): REDO VIDEO ASSISTED THORACOSCOPY (VATS)/DECORTICATION/DRAIN EFFUSION (Right) VIDEO BRONCHOSCOPY (N/A) -POD # 3 CV- in SR on IV amiodarone RESP- small air leak, basilar space slightly larger with CT  on water seal- will place back to suction, continue bronchodilators, IS RENAL- hypokalemia- supplement GI/ Nutrition- NPO due to aspiration, will have cortrack feeding tube placed as he already has sever protein calorie malnutrition ID- afebrile, leukocytosis resolved- day 6 Vancomycin- will dc after tomorrow's dose, day 4 meropenem- will plan 4 weeks course Ambulate SCD + enoxaparin for DVT prophylaxis   LOS: 6 days    William Kirk 11/12/2018

## 2018-11-12 NOTE — Progress Notes (Signed)
Initial Nutrition Assessment  DOCUMENTATION CODES:   Not applicable  INTERVENTION:   Cortrak service offered tomorrow from 8am-4pm. Once tube is placed recommend:   Tube feeding:  Osmolite 1.5 @ 50 ml/hr (1200 ml) 30 ml Prostat BID  Provides: 2000 kcals, 105 grams protein, 914 ml free water. Meets 100% of needs.   NUTRITION DIAGNOSIS:   Increased nutrient needs related to post-op healing as evidenced by estimated needs.  GOAL:   Patient will meet greater than or equal to 90% of their needs  MONITOR:   Diet advancement, Skin, TF tolerance, Weight trends, I & O's, Labs  REASON FOR ASSESSMENT:   Consult Enteral/tube feeding initiation and management  ASSESSMENT:   Patient with PMH significant for COPD, bronchiectasis, recurrent PNA/hemoptysis, PAD, and GERD. Presents this admission with empyema.   9/29- pleural drain placed IR 10/3- R thoracotomy, drainage empyema, extubated   RD working remotely.  Spoke with pt via phone. Denies having loss in appetite or swallowing issues PTA. States he typically consumes 2 meals daily that consist of oatmeal, chicken, vegetables, and potatoes. He tries to have 2 Ensures daily. Pt noted to aspirate after extubation. SLP recommends alternative means of nutrition.   Per previous RD note on 9/16 pt's UBW stayed around 170 lb, he lost 40 lb months ago but is slowly gaining it back. Pt noted to weigh 147 lb on 9/12 and 143 lb this admission. Unsure if this is dry weight loss vs fluid fluctuations. Unable to diagnose malnutrition at this time without NFPE.   I/O: +5,065 ml since admit UOP: 4,180 ml x 24 hrs  Chest tubes: 220 ml x 24 hrs    Drips: amiodarone, D5 in 1/2 NS @ 75 m/hr Medications: dulcolax, folic acid, SS novolog, MVI with minerals, senokot, vit B12 Labs: K 3.2 (L) CBG 79-98   Diet Order:   Diet Order            Diet NPO time specified  Diet effective now              EDUCATION NEEDS:   Education needs have  been addressed  Skin:  Skin Assessment: Skin Integrity Issues: Skin Integrity Issues:: Stage III, Incisions Stage III: bilateral ears Incisions: chest  Last BM:  10/5  Height:   Ht Readings from Last 1 Encounters:  11/09/18 5\' 11"  (1.803 m)    Weight:   Wt Readings from Last 1 Encounters:  11/08/18 64.9 kg    Ideal Body Weight:  75.5 kg  BMI:  Body mass index is 19.96 kg/m.  Estimated Nutritional Needs:   Kcal:  [id number]-[phone] kcal  Protein:  100-120 grams  Fluid:  >/= 2 L/day   Mariana Single RD, LDN Clinical Nutrition Pager # - [phone]

## 2018-11-13 ENCOUNTER — Ambulatory Visit: Payer: No Typology Code available for payment source | Admitting: Thoracic Surgery (Cardiothoracic Vascular Surgery)

## 2018-11-13 ENCOUNTER — Inpatient Hospital Stay (HOSPITAL_COMMUNITY): Payer: Medicare HMO

## 2018-11-13 LAB — CBC
HCT: 35.3 % — ABNORMAL LOW (ref 39.0–52.0)
Hemoglobin: 11 g/dL — ABNORMAL LOW (ref 13.0–17.0)
MCH: 27.5 pg (ref 26.0–34.0)
MCHC: 31.2 g/dL (ref 30.0–36.0)
MCV: 88.3 fL (ref 80.0–100.0)
Platelets: 457 10*3/uL — ABNORMAL HIGH (ref 150–400)
RBC: 4 MIL/uL — ABNORMAL LOW (ref 4.22–5.81)
RDW: 16.7 % — ABNORMAL HIGH (ref 11.5–15.5)
WBC: 6 10*3/uL (ref 4.0–10.5)
nRBC: 0 % (ref 0.0–0.2)

## 2018-11-13 LAB — GLUCOSE, CAPILLARY
Glucose-Capillary: 102 mg/dL — ABNORMAL HIGH (ref 70–99)
Glucose-Capillary: 119 mg/dL — ABNORMAL HIGH (ref 70–99)
Glucose-Capillary: 83 mg/dL (ref 70–99)
Glucose-Capillary: 90 mg/dL (ref 70–99)
Glucose-Capillary: 95 mg/dL (ref 70–99)
Glucose-Capillary: 96 mg/dL (ref 70–99)

## 2018-11-13 LAB — BASIC METABOLIC PANEL
Anion gap: 8 (ref 5–15)
BUN: 7 mg/dL — ABNORMAL LOW (ref 8–23)
CO2: 29 mmol/L (ref 22–32)
Calcium: 7.8 mg/dL — ABNORMAL LOW (ref 8.9–10.3)
Chloride: 104 mmol/L (ref 98–111)
Creatinine, Ser: 0.69 mg/dL (ref 0.61–1.24)
GFR calc Af Amer: >60 mL/min (ref >60)
GFR calc non Af Amer: >60 mL/min (ref >60)
Glucose, Bld: 97 mg/dL (ref 70–99)
Potassium: 3.4 mmol/L — ABNORMAL LOW (ref 3.5–5.1)
Sodium: 141 mmol/L (ref 135–145)

## 2018-11-13 LAB — PHOSPHORUS
Phosphorus: 2.9 mg/dL (ref 2.5–4.6)
Phosphorus: 2.4 mg/dL — ABNORMAL LOW (ref 2.5–4.6)

## 2018-11-13 LAB — MAGNESIUM
Magnesium: 1.8 mg/dL (ref 1.7–2.4)
Magnesium: 1.8 mg/dL (ref 1.7–2.4)

## 2018-11-13 MED ORDER — POTASSIUM CHLORIDE 20 MEQ/15ML (10%) PO SOLN
20.0000 meq | ORAL | Status: AC
  Administered 2018-11-13 (×3): 20 meq
  Filled 2018-11-13 (×3): qty 15

## 2018-11-13 MED ORDER — ACETAMINOPHEN 160 MG/5ML PO SOLN
650.0000 mg | Freq: Four times a day (QID) | ORAL | Status: AC
  Administered 2018-11-13 – 2018-11-18 (×20): 650 mg via ORAL
  Filled 2018-11-13 (×20): qty 20.3

## 2018-11-13 MED ORDER — ACETAMINOPHEN 650 MG RE SUPP: 650.0000 mg | Freq: Four times a day (QID) | RECTAL | Status: DC | PRN

## 2018-11-13 NOTE — Progress Notes (Signed)
TCTS Evening Rounds POD #4 s/p redo Right VATS for empyema. Stable day; af VSS x mild HTN No complaints Receiving TFs Continue supportive care. Laren Orama Z. Orvan Seen, [place]

## 2018-11-13 NOTE — Procedures (Signed)
Cortrak  Person Inserting Tube:  Willey Blade E, RD Tube Type:  Cortrak - 43 inches Tube Location:  Left nare Initial Placement:  Stomach Secured by: Bridle Technique Used to Measure Tube Placement:  Documented cm marking at nare/ corner of mouth Cortrak Secured At:  80 cm   Cortrak Tube Team Note:  Consult received to place a Cortrak feeding tube.   No x-ray is required. RN may begin using tube.   If the tube becomes dislodged please keep the tube and contact the Cortrak team at [email or web address] (password [email or web address]) for replacement.  If after hours and replacement cannot be delayed, place a NG tube and confirm placement with an abdominal x-ray.    Willey Blade, MS, [place], LDN Office: [phone] Pager: [phone] After Hours/Weekend Pager: [phone]

## 2018-11-13 NOTE — Progress Notes (Signed)
Nutrition Follow-up  DOCUMENTATION CODES:   Severe malnutrition in context of chronic illness  INTERVENTION:   Continue tube feeding:  -Osmolite 1.5 @ 50 ml/hr (1200 ml) via Cortrak -30 ml Prostat BID  Provides: 2000 kcals, 105 grams protein, 914 ml free water. Meets 100% of needs.   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  Ongoing  GOAL:   Patient will meet greater than or equal to 90% of their needs  Addressed via TF  MONITOR:   Diet advancement, Labs, I & O's, TF tolerance, Weight trends, Skin, Supplement acceptance, PO intake  REASON FOR ASSESSMENT:   Consult Enteral/tube feeding initiation and management  ASSESSMENT:   Patient with PMH significant for COPD, bronchiectasis, recurrent PNA/hemoptysis, PAD, and GERD. Presents this admission with empyema.   9/29- pleural drain placed IR 10/3- R thoracotomy, drainage empyema, extubated   Pt discussed during ICU rounds and with RN.   Continues on small dose pressors. Osmolite 1.5 started via NGT yesterday (replaced with gastric Cortrak this am). Tolerating feeding at goal rate. Plan for SLP evaluation. NFPE performed at beside. Pt shows to be severely malnourished. Will provide supplementation once diet advanced.   Current weight: 65.3 kg  Admission weight: 66.6 kg   I/O: +4,399 ml since admit UOP: 2.950 ml x 24 hrs Chest tubes: 300 ml x 24 hrs   Drips: amiodarone, D5 in 1/2NS @ 10 ml/hr  Medications: dulcolax, senokot, folic acid, SS novolog, MVI with minerals, Vit B12 Labs: K 3.4 (L) CBG 86-115  NUTRITION - FOCUSED PHYSICAL EXAM:    Most Recent Value  Orbital Region  Severe depletion  Upper Arm Region  Moderate depletion  Thoracic and Lumbar Region  Severe depletion  Buccal Region  Severe depletion  Temple Region  Severe depletion  Clavicle Bone Region  Severe depletion  Clavicle and Acromion Bone  Region  Moderate depletion  Scapular Bone Region  Severe depletion  Dorsal Hand  Severe depletion  Patellar Region  Severe depletion  Anterior Thigh Region  Severe depletion  Posterior Calf Region  Severe depletion  Edema (RD Assessment)  Unable to assess  Hair  Unable to assess  Eyes  Unable to assess  Mouth  Unable to assess  Skin  Unable to assess  Nails  Unable to assess     Diet Order:   Diet Order            Diet NPO time specified  Diet effective now              EDUCATION NEEDS:   Education needs have been addressed  Skin:  Skin Assessment: Skin Integrity Issues: Skin Integrity Issues:: Stage III, Incisions Stage III: bilateral ears Incisions: chest  Last BM:  10/5  Height:   Ht Readings from Last 1 Encounters:  11/09/18 5\' 11"  (1.803 m)    Weight:   Wt Readings from Last 1 Encounters:  11/13/18 65.3 kg    Ideal Body Weight:  75.5 kg  BMI:  Body mass index is 20.08 kg/m.  Estimated Nutritional Needs:   Kcal:  2000-2200 kcal  Protein:  100-120 grams  Fluid:  >/= 2 L/day   Mariana Single RD, LDN Clinical Nutrition Pager # - [phone]

## 2018-11-13 NOTE — Progress Notes (Signed)
4 Days Post-Op Procedure(s) (LRB): REDO VIDEO ASSISTED THORACOSCOPY (VATS)/DECORTICATION/DRAIN EFFUSION (Right) VIDEO BRONCHOSCOPY (N/A) Subjective: No complaints this AM, asking when he can go home  Objective: Vital signs in last 24 hours: Temp:  [96.7 F (35.9 C)-99.1 F (37.3 C)] 99.1 F (37.3 C) (10/06 0400) Pulse Rate:  [35-76] 76 (10/06 0600) Cardiac Rhythm: Normal sinus rhythm (10/06 0400) BP: (120-166)/(52-80) 120/57 (10/06 0600) SpO2:  [88 %-98 %] 95 % (10/06 0600) Weight:  [65.3 kg] 65.3 kg (10/06 0500)  Hemodynamic parameters for last 24 hours:    Intake/Output from previous day: 10/05 0701 - 10/06 0700 In: 2193.6 [I.V.:1676; IV Piggyback:517.6] Out: 3250 [Urine:2950; Chest Tube:300] Intake/Output this shift: No intake/output data recorded.  General appearance: alert, cooperative and no distress Neurologic: intact Heart: regular rate and rhythm Lungs: diminished breath sounds bilaterally, greatest at right base Abdomen: normal findings: soft, non-tender Wound: clean small air leak  Lab Results: Recent Labs    11/12/18 0509 11/13/18 0500  WBC 8.7 6.0  HGB 10.5* 11.0*  HCT 32.3* 35.3*  PLT 410* 457*   BMET:  Recent Labs    11/12/18 0509 11/13/18 0500  NA 139 141  K 3.2* 3.4*  CL 102 104  CO2 28 29  GLUCOSE 97 97  BUN 8 7*  CREATININE 0.89 0.69  CALCIUM 8.1* 7.8*    PT/INR: No results for input(s): LABPROT, INR in the last 72 hours. ABG    Component Value Date/Time   PHART 7.374 11/10/2018 1223   HCO3 23.1 11/10/2018 1223   TCO2 24 11/10/2018 1223   ACIDBASEDEF 2.0 11/10/2018 1223   O2SAT 98.0 11/10/2018 1223   CBG (last 3)  Recent Labs    11/12/18 2323 11/13/18 0345 11/13/18 0522  GLUCAP 115* 102* 95    Assessment/Plan: S/P Procedure(s) (LRB): REDO VIDEO ASSISTED THORACOSCOPY (VATS)/DECORTICATION/DRAIN EFFUSION (Right) VIDEO BRONCHOSCOPY (N/A) -CV- stable RESP_ empyema. Small air leak, decreased space on CXR, keep CT to  suction today. Continue bronchodilators and pulmonary hygiene ID- day 7 of vancomycin- dc after today. Meropenem- day 5 RENAL- hypokalemia- replete K ENDO- CBG well controlled Aspiration- Speech. NPO. NG on place, cortrack today Continue ambulation SCD + enoxaparin   LOS: 7 days    William Kirk 11/13/2018

## 2018-11-14 ENCOUNTER — Inpatient Hospital Stay (HOSPITAL_COMMUNITY): Payer: Medicare HMO

## 2018-11-14 LAB — BASIC METABOLIC PANEL
Anion gap: 6 (ref 5–15)
BUN: 12 mg/dL (ref 8–23)
CO2: 29 mmol/L (ref 22–32)
Calcium: 7.8 mg/dL — ABNORMAL LOW (ref 8.9–10.3)
Chloride: 105 mmol/L (ref 98–111)
Creatinine, Ser: 0.75 mg/dL (ref 0.61–1.24)
GFR calc Af Amer: >60 mL/min (ref >60)
GFR calc non Af Amer: >60 mL/min (ref >60)
Glucose, Bld: 99 mg/dL (ref 70–99)
Potassium: 3.9 mmol/L (ref 3.5–5.1)
Sodium: 140 mmol/L (ref 135–145)

## 2018-11-14 LAB — GLUCOSE, CAPILLARY
Glucose-Capillary: 75 mg/dL (ref 70–99)
Glucose-Capillary: 104 mg/dL — ABNORMAL HIGH (ref 70–99)
Glucose-Capillary: 98 mg/dL (ref 70–99)
Glucose-Capillary: 89 mg/dL (ref 70–99)
Glucose-Capillary: 77 mg/dL (ref 70–99)

## 2018-11-14 MED ORDER — POTASSIUM CHLORIDE 20 MEQ/15ML (10%) PO SOLN
20.0000 meq | ORAL | Status: AC
  Administered 2018-11-14 (×3): 20 meq
  Filled 2018-11-14 (×3): qty 15

## 2018-11-14 MED ORDER — METOPROLOL TARTRATE 25 MG/10 ML ORAL SUSPENSION
12.5000 mg | Freq: Every day | ORAL | Status: DC
  Administered 2018-11-14 – 2018-11-22 (×9): 12.5 mg
  Filled 2018-11-14 (×10): qty 5

## 2018-11-14 MED ORDER — ADULT MULTIVITAMIN LIQUID CH
15.0000 mL | Freq: Every day | ORAL | Status: DC
  Administered 2018-11-14: 15 mL via ORAL
  Filled 2018-11-14: qty 15

## 2018-11-14 MED ORDER — ADULT MULTIVITAMIN LIQUID CH
15.0000 mL | Freq: Every day | ORAL | Status: DC
  Administered 2018-11-15 – 2018-11-22 (×8): 15 mL
  Filled 2018-11-14 (×8): qty 15

## 2018-11-14 NOTE — Progress Notes (Signed)
Pt transferred from [facility] alert and oriented , double lumen CT to right side  With moderate air leak., cortrak tube in place  With feeds at 82ml/hr. Placed in bed and made comfortable.

## 2018-11-14 NOTE — Progress Notes (Signed)
5 Days Post-Op Procedure(s) (LRB): REDO VIDEO ASSISTED THORACOSCOPY (VATS)/DECORTICATION/DRAIN EFFUSION (Right) VIDEO BRONCHOSCOPY (N/A) Subjective: No complaints. Denies pain  Objective: Vital signs in last 24 hours: Temp:  [97.5 F (36.4 C)-97.9 F (36.6 C)] 97.6 F (36.4 C) (10/07 0733) Pulse Rate:  [59-74] 68 (10/07 0700) Cardiac Rhythm: Normal sinus rhythm (10/07 0400) BP: (125-161)/(57-107) 149/61 (10/07 0700) SpO2:  [88 %-98 %] 93 % (10/07 0700) Weight:  [65.9 kg] 65.9 kg (10/07 0500)  Hemodynamic parameters for last 24 hours:    Intake/Output from previous day: 10/06 0701 - 10/07 0700 In: 1914.2 [I.V.:846.9; NG/GT:650; IV Piggyback:417.2] Out: 1740 [Urine:1450; Emesis/NG output:50; Chest Tube:240] Intake/Output this shift: No intake/output data recorded.  General appearance: alert, cooperative and no distress Neurologic: intact Heart: regular rate and rhythm Lungs: diminished breath sounds bibasilar + air leak  Lab Results: Recent Labs    11/12/18 0509 11/13/18 0500  WBC 8.7 6.0  HGB 10.5* 11.0*  HCT 32.3* 35.3*  PLT 410* 457*   BMET:  Recent Labs    11/13/18 0500 11/14/18 0408  NA 141 140  K 3.4* 3.9  CL 104 105  CO2 29 29  GLUCOSE 97 99  BUN 7* 12  CREATININE 0.69 0.75  CALCIUM 7.8* 7.8*    PT/INR: No results for input(s): LABPROT, INR in the last 72 hours. ABG    Component Value Date/Time   PHART 7.374 11/10/2018 1223   HCO3 23.1 11/10/2018 1223   TCO2 24 11/10/2018 1223   ACIDBASEDEF 2.0 11/10/2018 1223   O2SAT 98.0 11/10/2018 1223   CBG (last 3)  Recent Labs    11/13/18 2346 11/14/18 0413 11/14/18 0603  GLUCAP 96 75 104*    Assessment/Plan: S/P Procedure(s) (LRB): REDO VIDEO ASSISTED THORACOSCOPY (VATS)/DECORTICATION/DRAIN EFFUSION (Right) VIDEO BRONCHOSCOPY (N/A) -POD # 5 ID- afebrile. Completed 7 days of vancomycin. Continue meropenem NPO secondary to aspiration, Cortrack tube placed yesterday, on tube feedings CBG  well controlled Still has an air leak- will leave tubes to suction for now Ambulate Transfer to [facility] when bed available   LOS: 8 days    Melrose Nakayama 11/14/2018

## 2018-11-14 NOTE — Plan of Care (Signed)
  Problem: Clinical Measurements: Goal: Ability to maintain clinical measurements within normal limits will improve Outcome: Progressing Goal: Respiratory complications will improve Outcome: Progressing Goal: Cardiovascular complication will be avoided Outcome: Progressing   

## 2018-11-14 NOTE — Progress Notes (Signed)
Speech Language Pathology Treatment: Dysphagia  Patient Details Name: William Kirk MRN: [id number] DOB: Sep 29, 1940 Today's Date: 11/14/2018 Time: 4098-1191 SLP Time Calculation (min) (ACUTE ONLY): 15 min  Assessment / Plan / Recommendation Clinical Impression  Pt has consistent coughing associated with ice chip trials and his cough is not productive to try to use yankauer. He still denies any dysphagia or coughing with PO intake recently before this admission, and says he wants his swallow to "get better." I still think it would be appropriate to allow him a few pieces of ice immediately after oral care when his mouth is thoroughly cleaned in order to utilize swallowing musculature and provide some moisture. We may want to repeat his MBS before the end of the week to assess for any possible changes even if clinically he is showing signs of persistent dysphagia.    HPI HPI: Patient is a 78 y.o. male with PMH: COPD, tobacco abuse, recurrent PNA, bronchiectasis, recurrent hemoptysis.  He has had multiple recurrent PNA's over the past several years. He underwent a right lower lobectomy on 9/10. He presented to hospital on 9/29 with increasing right sided chest pain and SOB. CT revealed complex pleural effusion. He underwent a redo right thoractomy for drainage of the empyema. He was intubated for procedure on 10/2 and extubated on 10/3 at 1230. He has history of dysphagia with 2018 MBS report recommending nectar thick liquids and chin tuck with swallow.      SLP Plan  Continue with current plan of care       Recommendations  Diet recommendations: NPO;Other(comment)(few ice chips from staff right after oral care) Medication Administration: Via alternative means                Oral Care Recommendations: Oral care QID Follow up Recommendations: (tba) SLP Visit Diagnosis: Dysphagia, pharyngeal phase (R13.13) Plan: Continue with current plan of care       GO                 Virl Axe Ellina Sivertsen 11/14/2018, 10:14 AM  Ivar Drape, M.A. CCC-SLP Acute Herbalist [phone] Office [phone]

## 2018-11-15 ENCOUNTER — Inpatient Hospital Stay (HOSPITAL_COMMUNITY): Payer: Medicare HMO

## 2018-11-15 ENCOUNTER — Inpatient Hospital Stay: Payer: Self-pay

## 2018-11-15 LAB — GLUCOSE, CAPILLARY
Glucose-Capillary: 105 mg/dL — ABNORMAL HIGH (ref 70–99)
Glucose-Capillary: 105 mg/dL — ABNORMAL HIGH (ref 70–99)
Glucose-Capillary: 93 mg/dL (ref 70–99)
Glucose-Capillary: 95 mg/dL (ref 70–99)
Glucose-Capillary: 99 mg/dL (ref 70–99)

## 2018-11-15 LAB — TRIGLYCERIDES: Triglycerides: 147 mg/dL (ref <150)

## 2018-11-15 NOTE — Progress Notes (Addendum)
6 Days Post-Op Procedure(s) (LRB): REDO VIDEO ASSISTED THORACOSCOPY (VATS)/DECORTICATION/DRAIN EFFUSION (Right) VIDEO BRONCHOSCOPY (N/A) Subjective: Awake and alert, says he is feeling better.  Objective: Vital signs in last 24 hours: Temp:  [97.6 F (36.4 C)-97.9 F (36.6 C)] 97.9 F (36.6 C) (10/08 0814) Pulse Rate:  [59-71] 70 (10/08 0814) Cardiac Rhythm: Normal sinus rhythm (10/08 0749) Resp:  [17-22] 22 (10/08 0814) BP: (137-155)/(59-75) 138/59 (10/08 0814) SpO2:  [92 %-96 %] 93 % (10/08 0814) Weight:  [65.8 kg] 65.8 kg (10/08 0329)   Intake/Output from previous day: 10/07 0701 - 10/08 0700 In: 518.9 [I.V.:418.9; IV Piggyback:100] Out: 1455 [Urine:501; Emesis/NG output:900; Stool:1; Chest Tube:50] Intake/Output this shift: Total I/O In: -  Out: 775 [Urine:775]  Physical Exam General appearance: alert, cooperative and no distress Neurologic: intact Heart: regular rate and rhythm, Monitor shows SR in 70's. Lungs: Air leak with cough. CXR with no significant change from yesterday.   Lab Results: Recent Labs    11/13/18 0500  WBC 6.0  HGB 11.0*  HCT 35.3*  PLT 457*   BMET:  Recent Labs    11/13/18 0500 11/14/18 0408  NA 141 140  K 3.4* 3.9  CL 104 105  CO2 29 29  GLUCOSE 97 99  BUN 7* 12  CREATININE 0.69 0.75  CALCIUM 7.8* 7.8*    PT/INR: No results for input(s): LABPROT, INR in the last 72 hours. ABG    Component Value Date/Time   PHART 7.374 11/10/2018 1223   HCO3 23.1 11/10/2018 1223   TCO2 24 11/10/2018 1223   ACIDBASEDEF 2.0 11/10/2018 1223   O2SAT 98.0 11/10/2018 1223   CBG (last 3)  Recent Labs    11/14/18 1646 11/15/18 0007 11/15/18 0616  GLUCAP 77 105* 93    Assessment/Plan: S/P Procedure(s) (LRB): REDO VIDEO ASSISTED THORACOSCOPY (VATS)/DECORTICATION/DRAIN EFFUSION (Right) VIDEO BRONCHOSCOPY (N/A)   -POD-9 right RE-DO VATS / decortication for empyema. Fluid Cx positive for klebsiella and Strept species . CT drainage 33ml  past 24 hours, moderate air leak with cough. CXR stable. Plan trial of water seal today, repeat CXR in AM. Repeat lab in AM  -Aspiration pneumonia- Continue NPO, TF via Cortrak feeding tube and treatment by Speech pathology. Completed 7-day course of vancomycin. Day 7 meropenem. Anticipate need for IV ABX for several more days. Will request PICC and remove central line when PICC established.   -DVT PX-on lovenox SQ daily.       LOS: 9 days    Antony Odea, [place] [phone] 11/15/2018 Patient seen and examined, d/w Mr. Michael Litter. Plan as above Air leak is decreased- will try on water seal  Remo Lipps C. Roxan Hockey, MD [facility] [phone]

## 2018-11-15 NOTE — Progress Notes (Signed)
PICC line order placed incorrectly RN called and made aware so that the order could be changed.

## 2018-11-15 NOTE — Plan of Care (Signed)
  Problem: Clinical Measurements: Goal: Ability to maintain clinical measurements within normal limits will improve Outcome: Progressing Goal: Will remain free from infection Outcome: Progressing   

## 2018-11-15 NOTE — Progress Notes (Signed)
Pharmacy Antibiotic Note  William Kirk is a 78 y.o. male admitted on 11/06/2018 with wound infection. S/p VATS lobectomy with Kleb.  Pharmacy has been consulted for meropenem dosing (plans noted for 4 week course per notes on 10/5). Plans also noted for PICC -WBC= 6, SCr= 0.75, afeb  Plan: -Continue meropenem 1gm q8h -If plans for discharge on IV antibiotics consider change to ertapenem 1gm IV q24h to ease administration  Height: 5\' 11"  (180.3 cm) Weight: 145 lb 1 oz (65.8 kg) IBW/kg (Calculated) : 75.3  Temp (24hrs), Avg:97.7 F (36.5 C), Min:97.6 F (36.4 C), Max:97.9 F (36.6 C)  Recent Labs  Lab 11/09/18 0152 11/10/18 0501 11/11/18 0508 11/11/18 0850 11/11/18 1230 11/12/18 0509 11/13/18 0500 11/14/18 0408  WBC 18.0* 15.5* 12.8*  --   --  8.7 6.0  --   CREATININE  --  1.18 0.97  --   --  0.89 0.69 0.75  VANCOTROUGH  --   --   --  11*  --   --   --   --   VANCOPEAK  --   --   --   --  32  --   --   --     Estimated Creatinine Clearance: 73.1 mL/min (by C-G formula based on SCr of 0.75 mg/dL).    No Known Allergies  Antimicrobials this admission: Vanc 9/29>> Zosyn 9/29>>10/2 Meropenem 10/2>  Dose adjustments this admission: N/A  Microbiology results: Pleural fluid Kleb ESBL - Senstive to meropenem  And Streptococcus constellatus S to Vancomycin   Bonnita Nasuti Pharm.D. CPP, BCPS Hildred Laser, PharmD Clinical Pharmacist **Pharmacist phone directory can now be found on [email or web address] ([email or web address]).  Listed under [place].

## 2018-11-16 ENCOUNTER — Inpatient Hospital Stay (HOSPITAL_COMMUNITY): Payer: Medicare HMO

## 2018-11-16 LAB — GLUCOSE, CAPILLARY
Glucose-Capillary: 91 mg/dL (ref 70–99)
Glucose-Capillary: 102 mg/dL — ABNORMAL HIGH (ref 70–99)
Glucose-Capillary: 104 mg/dL — ABNORMAL HIGH (ref 70–99)
Glucose-Capillary: 95 mg/dL (ref 70–99)
Glucose-Capillary: 90 mg/dL (ref 70–99)
Glucose-Capillary: 119 mg/dL — ABNORMAL HIGH (ref 70–99)

## 2018-11-16 LAB — BASIC METABOLIC PANEL
Anion gap: 9 (ref 5–15)
BUN: 15 mg/dL (ref 8–23)
CO2: 29 mmol/L (ref 22–32)
Calcium: 8.3 mg/dL — ABNORMAL LOW (ref 8.9–10.3)
Chloride: 101 mmol/L (ref 98–111)
Creatinine, Ser: 0.61 mg/dL (ref 0.61–1.24)
GFR calc Af Amer: >60 mL/min (ref >60)
GFR calc non Af Amer: >60 mL/min (ref >60)
Glucose, Bld: 92 mg/dL (ref 70–99)
Potassium: 4.1 mmol/L (ref 3.5–5.1)
Sodium: 139 mmol/L (ref 135–145)

## 2018-11-16 LAB — CBC
HCT: 36.4 % — ABNORMAL LOW (ref 39.0–52.0)
Hemoglobin: 11.7 g/dL — ABNORMAL LOW (ref 13.0–17.0)
MCH: 28.3 pg (ref 26.0–34.0)
MCHC: 32.1 g/dL (ref 30.0–36.0)
MCV: 88.1 fL (ref 80.0–100.0)
Platelets: 381 10*3/uL (ref 150–400)
RBC: 4.13 MIL/uL — ABNORMAL LOW (ref 4.22–5.81)
RDW: 16.7 % — ABNORMAL HIGH (ref 11.5–15.5)
WBC: 9 10*3/uL (ref 4.0–10.5)
nRBC: 0 % (ref 0.0–0.2)

## 2018-11-16 LAB — FUNGUS CULTURE WITH STAIN

## 2018-11-16 LAB — FUNGUS CULTURE RESULT

## 2018-11-16 LAB — FUNGAL ORGANISM REFLEX

## 2018-11-16 MED ORDER — SODIUM CHLORIDE 0.9% FLUSH: 10.0000 mL | INTRAVENOUS | Status: DC | PRN

## 2018-11-16 MED ORDER — SODIUM CHLORIDE 0.9% FLUSH
10.0000 mL | Freq: Two times a day (BID) | INTRAVENOUS | Status: DC
  Administered 2018-11-16 – 2018-11-22 (×11): 10 mL

## 2018-11-16 NOTE — Progress Notes (Signed)
Peripherally Inserted Central Catheter/Midline Placement  The IV Nurse has discussed with the patient and/or persons authorized to consent for the patient, the purpose of this procedure and the potential benefits and risks involved with this procedure.  The benefits include less needle sticks, lab draws from the catheter, and the patient may be discharged home with the catheter. Risks include, but not limited to, infection, bleeding, blood clot (thrombus formation), and puncture of an artery; nerve damage and irregular heartbeat and possibility to perform a PICC exchange if needed/ordered by physician.  Alternatives to this procedure were also discussed.  Bard Power PICC patient education guide, fact sheet on infection prevention and patient information card has been provided to patient /or left at bedside.    PICC/Midline Placement Documentation  PICC Double Lumen 11/16/18 Right Brachial 36 cm 0 cm (Active)  Indication for Insertion or Continuance of Line Prolonged intravenous therapies 11/16/18 0820  Exposed Catheter (cm) 0 cm 11/16/18 0820  Site Assessment Dry;Intact;Clean 11/16/18 0820  Lumen #1 Status Flushed;Blood return noted;Saline locked 11/16/18 0820  Lumen #2 Status Flushed;Blood return noted;Saline locked 11/16/18 0820  Dressing Type Transparent 11/16/18 0820  Dressing Status Dry;Clean;Antimicrobial disc in place;Intact 11/16/18 0820  Dressing Change Due 11/23/18 11/16/18 0820       Scotty Court 11/16/2018, 8:23 AM

## 2018-11-16 NOTE — Progress Notes (Signed)
Nutrition Follow-up  RD working remotely.  DOCUMENTATION CODES:   Severe malnutrition in context of chronic illness  INTERVENTION:   Continue tube feeds via Cortrak: - Osmolite 1.5 @ 50 ml/hr (1200 ml/day) - Pro-stat 30 ml BID  Tube feeding regimen provides 2000 kcal, 105 grams of protein, and 914 ml of H2O.   - RD will monitor for diet advancement and supplement as appropriate  NUTRITION DIAGNOSIS:   Severe Malnutrition related to chronic illness (COPD) as evidenced by severe fat depletion, severe muscle depletion.  Ongoing, being addressed via TF  GOAL:   Patient will meet greater than or equal to 90% of their needs  Met via TF  MONITOR:   Diet advancement, Labs, I & O's, TF tolerance, Weight trends, Skin, Supplement acceptance, PO intake  REASON FOR ASSESSMENT:   Consult Enteral/tube feeding initiation and management  ASSESSMENT:   Patient with PMH significant for COPD, bronchiectasis, recurrent PNA/hemoptysis, PAD, and GERD. Presents this admission with empyema.  9/29 - pleural drain placed IR 10/3 - R thoracotomy, drainage empyema, extubated 10/6 - Cortrak placed  SLP evaluated pt on 10/7 with recommendations for NPO.  Weight down 11 lbs since admit. Unsure of accuracy given weight down 10 lbs from 10/8 to 10/9. Will continue to monitor trends.  Per RN edema assessment, pt with non-pitting edema to BLE.  Spoke with RN via phone call who reports pt is tolerating his TF without issue.  Medications reviewed and include: Senokot, folic acid, SSI, liquid MVI, vitamin B-12, amiodarone, IV abx  Labs reviewed. CBG's: 91-105 x 24 hours  UOP: 1225 ml x 24 hours CT: 120 ml x 24 hours I/O's: +5.0 L since admit  Diet Order:   Diet Order            Diet NPO time specified  Diet effective now              EDUCATION NEEDS:   Education needs have been addressed  Skin:  Skin Assessment: Skin Integrity Issues: Skin Integrity Issues: Stage III:  bilateral ears Incisions: chest  Last BM:  11/15/18  Height:   Ht Readings from Last 1 Encounters:  11/09/18 5' 11"  (1.803 m)    Weight:   Wt Readings from Last 1 Encounters:  11/16/18 61.6 kg    Ideal Body Weight:  75.5 kg  BMI:  Body mass index is 18.94 kg/m.  Estimated Nutritional Needs:   Kcal:  2000-2200 kcal  Protein:  100-120 grams  Fluid:  >/= 2 L/day    Gaynell Face, MS, RD, LDN Inpatient Clinical Dietitian Pager: [phone] Weekend/After Hours: [phone]

## 2018-11-16 NOTE — Progress Notes (Addendum)
      [address]       [place] [address]             [phone]      7 Days Post-Op Procedure(s) (LRB): REDO VIDEO ASSISTED THORACOSCOPY (VATS)/DECORTICATION/DRAIN EFFUSION (Right) VIDEO BRONCHOSCOPY (N/A) Subjective: A little confused this morning.   Objective: Vital signs in last 24 hours: Temp:  [97.4 F (36.3 C)-97.9 F (36.6 C)] 97.9 F (36.6 C) (10/09 0730) Pulse Rate:  [62-73] 73 (10/09 0837) Cardiac Rhythm: Normal sinus rhythm (10/09 0701) Resp:  [16-22] 19 (10/09 0837) BP: (106-144)/(57-71) 106/59 (10/09 0837) SpO2:  [90 %-94 %] 91 % (10/09 0837) Weight:  [61.6 kg] 61.6 kg (10/09 0529)     Intake/Output from previous day: 10/08 0701 - 10/09 0700 In: 3265.2 [I.V.:382.8; NG/GT:2408; IV Piggyback:299.4] Out: [id number] [Urine:1225; Stool:150; Chest Tube:120] Intake/Output this shift: No intake/output data recorded.  General appearance: alert, cooperative and no distress Heart: regular rate and rhythm, S1, S2 normal, no murmur, click, rub or gallop Lungs: clear to auscultation bilaterally Abdomen: soft, non-tender; bowel sounds normal; no masses,  no organomegaly Extremities: extremities normal, atraumatic, no cyanosis or edema Wound: clean and dry  Lab Results: Recent Labs    11/16/18 0345  WBC 9.0  HGB 11.7*  HCT 36.4*  PLT 381   BMET:  Recent Labs    11/14/18 0408 11/16/18 0345  NA 140 139  K 3.9 4.1  CL 105 101  CO2 29 29  GLUCOSE 99 92  BUN 12 15  CREATININE 0.75 0.61  CALCIUM 7.8* 8.3*    PT/INR: No results for input(s): LABPROT, INR in the last 72 hours. ABG    Component Value Date/Time   PHART 7.374 11/10/2018 1223   HCO3 23.1 11/10/2018 1223   TCO2 24 11/10/2018 1223   ACIDBASEDEF 2.0 11/10/2018 1223   O2SAT 98.0 11/10/2018 1223   CBG (last 3)  Recent Labs    11/15/18 2102 11/15/18 2346 11/16/18 0535  GLUCAP 105* 99 102*    Assessment/Plan: S/P Procedure(s) (LRB): REDO VIDEO ASSISTED THORACOSCOPY  (VATS)/DECORTICATION/DRAIN EFFUSION (Right) VIDEO BRONCHOSCOPY (N/A)   -POD-10 right RE-DO VATS / decortication for empyema. Fluid Cx positive for klebsiella and Strept species . CT drainage 144ml past 24 hours, moderate air leak with cough. On water seal. CXR showed increased right basilar pneumo.   -Aspiration pneumonia- Continue NPO, TF via Cortrak feeding tube and treatment by Speech pathology. Completed 7-day course of vancomycin. Day 7 meropenem. Anticipate need for IV ABX for several more days and PO antibiotics at home.   -PICC line placement yesterday  -DVT PX-on lovenox SQ daily.   Plan: Keep chest tube to water seal. CXR in the morning. Will order IS. Ambulate as able.     LOS: 10 days    William Kirk 11/16/2018 Patient seen and examined, agree with above He does have a space at the right base. Will leave tubes on water seal today and repeat CXR in AM Continue antibiotics  Remo Lipps C. Roxan Hockey, MD [facility] [phone]

## 2018-11-16 NOTE — Progress Notes (Signed)
Speech Language Pathology Treatment: Dysphagia  Patient Details Name: William Kirk MRN: [id number] DOB: 1940/06/07 Today's Date: 11/16/2018 Time: 1210-1226 SLP Time Calculation (min) (ACUTE ONLY): 16 min  Assessment / Plan / Recommendation Clinical Impression  Pt seen in setting of ongoing NPO status with Cortrak. Pt and his wife confirm gradual improvement over the week in general strength and endurance. Vocal quality only slightly hoarse at this point. Pt continues to demonstrate evidence of standing pharyngeal secretions and needed max cues and deep suction to base of tongue to aid in clearing. He does very well following commands and strategies and was able to chin tuck, double swallow and produce a hard cough in 100% of trials. Unfortunately there is quite convincing evidence of aspiration with minimal nectar thick teaspoon trials. Puree trials were more promising with only slight throat clear, but no wet vocal quality or coughing after several bites. .   Overall, pt is likely to continue to need Cortrak for several more days at least, but he does have the potential to tolerate increased access to therapeutic PO, such as bites of puree with family assist. More frequent and functional use of the swallow mechanism may improve therapeutic gains. A repeat MBS is needed to determine if occasional bites of puree would be safe. Will attempt repeat MBS today or tomorrow as scheduling allows. Pt and wife in agreement with plan.    HPI HPI: Patient is a 78 y.o. male with PMH: COPD, tobacco abuse, recurrent PNA, bronchiectasis, recurrent hemoptysis.  He has had multiple recurrent PNA's over the past several years. He underwent a right lower lobectomy on 9/10. He presented to hospital on 9/29 with increasing right sided chest pain and SOB. CT revealed complex pleural effusion. He underwent a redo right thoractomy for drainage of the empyema. He was intubated for procedure on 10/2 and extubated on 10/3 at  1230. He has history of dysphagia with 2018 MBS report recommending nectar thick liquids and chin tuck with swallow.      SLP Plan  MBS       Recommendations  Diet recommendations: NPO                Oral Care Recommendations: Oral care QID Follow up Recommendations: Other (comment) SLP Visit Diagnosis: Dysphagia, pharyngeal phase (R13.13) Plan: MBS       GO               Harlon Ditty, MA CCC-SLP  Acute Rehabilitation Services Pager [phone] Office [phone]  Claudine Mouton 11/16/2018, 12:54 PM

## 2018-11-16 NOTE — Progress Notes (Signed)
Modified Barium Swallow Progress Note  Patient Details  Name: William Kirk MRN: [id number] Date of Birth: 12-05-1940  Today's Date: 11/16/2018  Modified Barium Swallow completed.  Full report located under Chart Review in the Imaging Section.  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 During the swallow penetration and aspiration exhibited with all POs; inconsistently sensed (1/2 teaspoons of nectar thick, honey thick, and puree POs). Weaker cough was ineffective at expelling penetrates and aspirates. Global pharyneal deficits include reduced base of tongue retraction, incomplete epiglottic deflection/poor laryngeal vestibule closure, decreased pharyngeal stripping wave, decreased glottic closure, reduced laryngeal elevation, and diminished sensation. A safe PO diet cannot be recommended. SLP to follow up for intensive pharyngeal strengthening exercises. Pt was educated on findings/recommendations.   Swallow Evaluation Recommendations       SLP Diet Recommendations: NPO;Alternative means - temporary       Medication Administration: Via alternative means               Oral Care Recommendations: Oral care QID        Kahlin Mark E Tequilla Cousineau MA, CCC-SLP Acute Rehabilitation Services 11/16/2018,3:54 PM

## 2018-11-17 ENCOUNTER — Inpatient Hospital Stay (HOSPITAL_COMMUNITY): Payer: Medicare HMO

## 2018-11-17 LAB — GLUCOSE, CAPILLARY
Glucose-Capillary: 100 mg/dL — ABNORMAL HIGH (ref 70–99)
Glucose-Capillary: 102 mg/dL — ABNORMAL HIGH (ref 70–99)
Glucose-Capillary: 109 mg/dL — ABNORMAL HIGH (ref 70–99)
Glucose-Capillary: 117 mg/dL — ABNORMAL HIGH (ref 70–99)
Glucose-Capillary: 85 mg/dL (ref 70–99)
Glucose-Capillary: 85 mg/dL (ref 70–99)
Glucose-Capillary: 98 mg/dL (ref 70–99)

## 2018-11-17 MED ORDER — AMIODARONE HCL 200 MG PO TABS: 200.0000 mg | ORAL_TABLET | Freq: Every day | ORAL | Status: DC

## 2018-11-17 NOTE — Plan of Care (Signed)

## 2018-11-17 NOTE — Progress Notes (Signed)
Ambulated along the hallway with front wheelchair with brief stop sec. to sob.Marland Kitchen assisted back to bed.

## 2018-11-17 NOTE — Progress Notes (Addendum)
      [address]       [place] [address]             [phone]       8 Days Post-Op Procedure(s) (LRB): REDO VIDEO ASSISTED THORACOSCOPY (VATS)/DECORTICATION/DRAIN EFFUSION (Right) VIDEO BRONCHOSCOPY (N/A)  Subjective: Patient is awake, alert, and pleasant this am.  Objective: Vital signs in last 24 hours: Temp:  [97.7 F (36.5 C)-98.2 F (36.8 C)] 98.2 F (36.8 C) (10/10 0410) Pulse Rate:  [66-73] 67 (10/10 0715) Cardiac Rhythm: Normal sinus rhythm (10/10 0715) Resp:  [18-24] 21 (10/10 0715) BP: (107-132)/(49-88) 125/88 (10/10 0715) SpO2:  [90 %-98 %] 96 % (10/10 0852)   Intake/Output from previous day: 10/09 0701 - 10/10 0700 In: 1541.7 [I.V.:331.7; NG/GT:910; IV Piggyback:299.9] Out: 2310 [Urine:2200; Chest Tube:110]   Physical Exam:  Cardiovascular: RRR Pulmonary: Clear to auscultation on left and slightly diminished right base Abdomen: Soft, non tender, bowel sounds present. Extremities: No lower extremity edema. Wound: Clean and dry.  No erythema or signs of infection. Chest Tube: to water seal, +++ with cough  Lab Results: CBC: Recent Labs    11/16/18 0345  WBC 9.0  HGB 11.7*  HCT 36.4*  PLT 381   BMET:  Recent Labs    11/16/18 0345  NA 139  K 4.1  CL 101  CO2 29  GLUCOSE 92  BUN 15  CREATININE 0.61  CALCIUM 8.3*    PT/INR: No results for input(s): LABPROT, INR in the last 72 hours. ABG:  INR: Will add last result for INR, ABG once components are confirmed Will add last 4 CBG results once components are confirmed  Assessment/Plan:  1. CV - SR in the 60's. On Amiodarone drip. On Lopressor 12.5 mg via tube . Will stop Amiodarone as has been on drip for about 6 days. 2.  Pulmonary - History of COPD. Continue Incruse Ellipta. On room air this am. CTs with 110 cc last 24 hours. CTs to water seal and there is an air leak. CXR this am small right basilar pneumothorax (space), atelectasis. Encourage incentive spirometer  3. GI-seen by speech pathology for mod barium swallow study yesterday. Continue NPO, TFs- has Cortrak 4. Anemia-Last H and H 11.7 and 36.4 5.ID-on Meropenem for Streptococcus constellatus and Klebsiella pneumoniae  Donielle M ZimmermanPA-C 11/17/2018,10:00 AM [phone]  I have seen and examined the patient and agree with the assessment and plan as outlined.  Rexene Alberts, MD 11/17/2018 12:24 PM

## 2018-11-18 ENCOUNTER — Inpatient Hospital Stay (HOSPITAL_COMMUNITY): Payer: Medicare HMO

## 2018-11-18 LAB — GLUCOSE, CAPILLARY
Glucose-Capillary: 106 mg/dL — ABNORMAL HIGH (ref 70–99)
Glucose-Capillary: 92 mg/dL (ref 70–99)
Glucose-Capillary: 103 mg/dL — ABNORMAL HIGH (ref 70–99)
Glucose-Capillary: 95 mg/dL (ref 70–99)
Glucose-Capillary: 103 mg/dL — ABNORMAL HIGH (ref 70–99)

## 2018-11-18 NOTE — Plan of Care (Signed)
  Problem: Education: Goal: Knowledge of General Education information will improve Description: Including pain rating scale, medication(s)/side effects and non-pharmacologic comfort measures Outcome: Progressing   Problem: Clinical Measurements: Goal: Cardiovascular complication will be avoided Outcome: Progressing   Problem: Nutrition: Goal: Adequate nutrition will be maintained Outcome: Progressing   

## 2018-11-18 NOTE — Progress Notes (Addendum)
      [address]       [place], [address]             [phone]       9 Days Post-Op Procedure(s) (LRB): REDO VIDEO ASSISTED THORACOSCOPY (VATS)/DECORTICATION/DRAIN EFFUSION (Right) VIDEO BRONCHOSCOPY (N/A)  Subjective: Patient just walked and is sitting in chair this am. He has no specific complaints.  Objective: Vital signs in last 24 hours: Temp:  [97.5 F (36.4 C)-98.7 F (37.1 C)] 97.6 F (36.4 C) (10/11 0801) Pulse Rate:  [60-90] 90 (10/11 0801) Cardiac Rhythm: Normal sinus rhythm (10/11 0718) Resp:  [18-22] 20 (10/11 0801) BP: (126-146)/(63-71) 146/70 (10/11 0801) SpO2:  [94 %-98 %] 97 % (10/11 0801) Weight:  [60.2 kg] 60.2 kg (10/11 0500)   Intake/Output from previous day: 10/10 0701 - 10/11 0700 In: 1542.6 [I.V.:100.2; NG/GT:1150; IV Piggyback:292.4] Out: 995 [Urine:875; Chest Tube:120]   Physical Exam:  Cardiovascular: RRR Pulmonary: Clear to auscultation on left and slightly diminished right base Abdomen: Soft, non tender, bowel sounds present. Extremities: No lower extremity edema. Wound: Clean and dry.  No erythema or signs of infection. Chest Tube: to water seal, +++ with cough  Lab Results: CBC: Recent Labs    11/16/18 0345  WBC 9.0  HGB 11.7*  HCT 36.4*  PLT 381   BMET:  Recent Labs    11/16/18 0345  NA 139  K 4.1  CL 101  CO2 29  GLUCOSE 92  BUN 15  CREATININE 0.61  CALCIUM 8.3*    PT/INR: No results for input(s): LABPROT, INR in the last 72 hours. ABG:  INR: Will add last result for INR, ABG once components are confirmed Will add last 4 CBG results once components are confirmed  Assessment/Plan:  1. CV - SR in the 60-70's.  On Lopressor 12.5 mg via tube . 2.  Pulmonary - History of COPD. Continue Incruse Ellipta. On room air this am. CTs with 120 cc last 24 hours. CTs to water seal and there is an air leak. It appears chest tubes have gotten pulled back, especially anterior chest tube. CXR this am  small right basilar pneumothorax (space), atelectasis, small effusion. Encourage incentive spirometer 3. GI-seen by speech pathology for mod barium swallow study yesterday. Continue NPO, TFs- has Cortrak 4. Anemia-Last H and H 11.7 and 36.4 5.ID-on Meropenem for Streptococcus constellatus and Klebsiella pneumoniae  Donielle M ZimmermanPA-C 11/18/2018,9:40 AM [phone]    I have seen and examined the patient and agree with the assessment and plan as outlined.  D/C anterior tube and leave remaining tube to water seal  Rexene Alberts, MD 11/18/2018 10:37 AM

## 2018-11-19 ENCOUNTER — Inpatient Hospital Stay (HOSPITAL_COMMUNITY): Payer: Medicare HMO

## 2018-11-19 LAB — GLUCOSE, CAPILLARY
Glucose-Capillary: 105 mg/dL — ABNORMAL HIGH (ref 70–99)
Glucose-Capillary: 84 mg/dL (ref 70–99)
Glucose-Capillary: 96 mg/dL (ref 70–99)
Glucose-Capillary: 97 mg/dL (ref 70–99)
Glucose-Capillary: 99 mg/dL (ref 70–99)

## 2018-11-19 LAB — TRIGLYCERIDES: Triglycerides: 294 mg/dL — ABNORMAL HIGH (ref <150)

## 2018-11-19 NOTE — Progress Notes (Signed)
      [address]       [place] [address]             [phone]      10 Days Post-Op Procedure(s) (LRB): REDO VIDEO ASSISTED THORACOSCOPY (VATS)/DECORTICATION/DRAIN EFFUSION (Right) VIDEO BRONCHOSCOPY (N/A) Subjective: Feels okay. Wants to go home.   Objective: Vital signs in last 24 hours: Temp:  [97.5 F (36.4 C)-98 F (36.7 C)] 97.7 F (36.5 C) (10/12 0400) Pulse Rate:  [64-90] 80 (10/12 0400) Cardiac Rhythm: Normal sinus rhythm (10/12 0701) Resp:  [18-33] 19 (10/12 0439) BP: (123-146)/(58-70) 135/65 (10/12 0400) SpO2:  [93 %-100 %] 96 % (10/12 0400) Weight:  [60.5 kg] 60.5 kg (10/12 0439)     Intake/Output from previous day: 10/11 0701 - 10/12 0700 In: 1750.4 [NG/GT:1350; IV Piggyback:400.4] Out: 2080 [Urine:1950; Chest Tube:130] Intake/Output this shift: No intake/output data recorded.  General appearance: alert, cooperative and no distress Heart: regular rate and rhythm, S1, S2 normal, no murmur, click, rub or gallop Lungs: clear to auscultation bilaterally Abdomen: soft, non-tender; bowel sounds normal; no masses,  no organomegaly Extremities: extremities normal, atraumatic, no cyanosis or edema Wound: clean and dry  Lab Results: No results for input(s): WBC, HGB, HCT, PLT in the last 72 hours. BMET: No results for input(s): NA, K, CL, CO2, GLUCOSE, BUN, CREATININE, CALCIUM in the last 72 hours.  PT/INR: No results for input(s): LABPROT, INR in the last 72 hours. ABG    Component Value Date/Time   PHART 7.374 11/10/2018 1223   HCO3 23.1 11/10/2018 1223   TCO2 24 11/10/2018 1223   ACIDBASEDEF 2.0 11/10/2018 1223   O2SAT 98.0 11/10/2018 1223   CBG (last 3)  Recent Labs    11/18/18 2325 11/19/18 0436 11/19/18 0621  GLUCAP 103* 84 99    Assessment/Plan: S/P Procedure(s) (LRB): REDO VIDEO ASSISTED THORACOSCOPY (VATS)/DECORTICATION/DRAIN EFFUSION (Right) VIDEO BRONCHOSCOPY (N/A)  1. CV - SR in the 60's. Off Amio. On Lopressor  12.5 mg via tube .  2.  Pulmonary - History of COPD. Continue Incruse Ellipta. On room air this am. CTs with 110 cc last 24 hours. CTs to water seal and there is an air leak. CXR this am is pending.  Encourage incentive spirometer 3. GI-seen by speech pathology for mod barium swallow study yesterday. Continue NPO, TFs- has Cortrak 4. Anemia-Last H and H 11.7 and 36.4 5.ID-on Meropenem for Streptococcus constellatus and Klebsiella pneumoniae  Plan: Feels okay this morning. He walked in the halls yesterday. Air leak remains on water seal. Will await CXR this morning.    LOS: 13 days    [name] 11/19/2018

## 2018-11-19 NOTE — Progress Notes (Signed)
Speech Language Pathology Treatment: Dysphagia  Patient Details Name: William Kirk MRN: [id number] DOB: 1941/01/01 Today's Date: 11/19/2018 Time: 1600-1630 SLP Time Calculation (min) (ACUTE ONLY): 30 min  Assessment / Plan / Recommendation Clinical Impression  Followed up for pharyngeal strengthening exercises, pt education on recent repeat MBSS, and reinforcement of safe swallowing strategies. Educated regarding current NPO status due to aspiration evidenced across POs on repeat MBSS 10/9, pt agreeable stating "I don't want another PNA".. Educated upon neuromuscular pharyngeal strengthening exercises including effortful swallow, masako maneuver, showa maneuver, and jaw stretch. Pt completed repetitions 5-10 exercises with instruction from SLP. Effortful swallow utilized with single ice chips as well as volitional cough and second swallow. Intermittent delayed cough and wet vocal quality observed intermittently. Recommend continue alternative nutrition with initiation of single ice chips with exercises and safe swallow strategies, oral care QID. SLP to follow up.     HPI HPI: Patient is a 78 y.o. male with PMH: COPD, tobacco abuse, recurrent PNA, bronchiectasis, recurrent hemoptysis.  He has had multiple recurrent PNA's over the past several years. He underwent a right lower lobectomy on 9/10. He presented to hospital on 9/29 with increasing right sided chest pain and SOB. CT revealed complex pleural effusion. He underwent a redo right thoractomy for drainage of the empyema. He was intubated for procedure on 10/2 and extubated on 10/3 at 1230. He has history of dysphagia with 2018 MBS report recommending nectar thick liquids and chin tuck with swallow.      SLP Plan  Continue with current plan of care       Recommendations  Diet recommendations: NPO(single ice chips following oral care with swallowing exercise) Liquids provided via: Teaspoon Medication Administration: Via alternative  means Compensations: Clear throat after each swallow;Hard cough after swallow;Effortful swallow                Oral Care Recommendations: Oral care QID;Oral care prior to ice chip/H20 SLP Visit Diagnosis: Dysphagia, pharyngeal phase (R13.13) Plan: Continue with current plan of care       GO                Karl Knarr E Jackston [place] MA, CCC-SLP Acute Rehabilitation Services  11/19/2018, 4:36 PM

## 2018-11-19 NOTE — Plan of Care (Signed)

## 2018-11-20 ENCOUNTER — Inpatient Hospital Stay (HOSPITAL_COMMUNITY): Payer: Medicare HMO

## 2018-11-20 LAB — GLUCOSE, CAPILLARY
Glucose-Capillary: 106 mg/dL — ABNORMAL HIGH (ref 70–99)
Glucose-Capillary: 98 mg/dL (ref 70–99)
Glucose-Capillary: 88 mg/dL (ref 70–99)
Glucose-Capillary: 83 mg/dL (ref 70–99)
Glucose-Capillary: 104 mg/dL — ABNORMAL HIGH (ref 70–99)
Glucose-Capillary: 105 mg/dL — ABNORMAL HIGH (ref 70–99)

## 2018-11-20 LAB — COMPREHENSIVE METABOLIC PANEL
ALT: 23 U/L (ref 0–44)
AST: 25 U/L (ref 15–41)
Albumin: 2 g/dL — ABNORMAL LOW (ref 3.5–5.0)
Alkaline Phosphatase: 107 U/L (ref 38–126)
Anion gap: 7 (ref 5–15)
BUN: 31 mg/dL — ABNORMAL HIGH (ref 8–23)
CO2: 28 mmol/L (ref 22–32)
Calcium: 8.5 mg/dL — ABNORMAL LOW (ref 8.9–10.3)
Chloride: 102 mmol/L (ref 98–111)
Creatinine, Ser: 0.71 mg/dL (ref 0.61–1.24)
GFR calc Af Amer: >60 mL/min (ref >60)
GFR calc non Af Amer: >60 mL/min (ref >60)
Glucose, Bld: 105 mg/dL — ABNORMAL HIGH (ref 70–99)
Potassium: 4.7 mmol/L (ref 3.5–5.1)
Sodium: 137 mmol/L (ref 135–145)
Total Bilirubin: 0.6 mg/dL (ref 0.3–1.2)
Total Protein: 6.6 g/dL (ref 6.5–8.1)

## 2018-11-20 LAB — CBC
HCT: 36.2 % — ABNORMAL LOW (ref 39.0–52.0)
Hemoglobin: 11.5 g/dL — ABNORMAL LOW (ref 13.0–17.0)
MCH: 27.8 pg (ref 26.0–34.0)
MCHC: 31.8 g/dL (ref 30.0–36.0)
MCV: 87.4 fL (ref 80.0–100.0)
Platelets: 350 10*3/uL (ref 150–400)
RBC: 4.14 MIL/uL — ABNORMAL LOW (ref 4.22–5.81)
RDW: 16.6 % — ABNORMAL HIGH (ref 11.5–15.5)
WBC: 10.4 10*3/uL (ref 4.0–10.5)
nRBC: 0 % (ref 0.0–0.2)

## 2018-11-20 NOTE — Progress Notes (Signed)
Speech Language Pathology Treatment: Dysphagia  Patient Details Name: William Kirk MRN: [id number] DOB: 09-Sep-1940 Today's Date: 11/20/2018 Time: 1207-1231 SLP Time Calculation (min) (ACUTE ONLY): 24 min  Assessment / Plan / Recommendation Clinical Impression  Pt was seen for ongoing dysphagia treatment. He admits to not completing his exercises since SLP session on previous date but was agreeable to doing them today. SLP focused on effortful swallow, masako, and introduction of CTAR. Min cues were given to complete. Education was also provided about the importance of frequency. Would remain NPO except for ice chips still for now.   HPI HPI: Patient is a 78 y.o. male with PMH: COPD, tobacco abuse, recurrent PNA, bronchiectasis, recurrent hemoptysis.  He has had multiple recurrent PNA's over the past several years. He underwent a right lower lobectomy on 9/10. He presented to hospital on 9/29 with increasing right sided chest pain and SOB. CT revealed complex pleural effusion. He underwent a redo right thoractomy for drainage of the empyema. He was intubated for procedure on 10/2 and extubated on 10/3 at 1230. He has history of dysphagia with 2018 MBS report recommending nectar thick liquids and chin tuck with swallow.      SLP Plan  Continue with current plan of care       Recommendations  Diet recommendations: NPO;Other(comment)(few ice chips at a time after oral care) Medication Administration: Via alternative means                Oral Care Recommendations: Oral care QID;Oral care prior to ice chip/H20 Follow up Recommendations: (SLP f/u at next level of care) SLP Visit Diagnosis: Dysphagia, pharyngeal phase (R13.13) Plan: Continue with current plan of care       GO                Virl Axe Taegen Delker 11/20/2018, 1:28 PM  Ivar Drape, M.A. CCC-SLP Acute Herbalist [phone] Office [phone]

## 2018-11-20 NOTE — Plan of Care (Signed)
  Problem: Clinical Measurements: Goal: Ability to maintain clinical measurements within normal limits will improve Outcome: Progressing Goal: Respiratory complications will improve Outcome: Progressing Goal: Cardiovascular complication will be avoided Outcome: Progressing   

## 2018-11-20 NOTE — Progress Notes (Signed)
PT Cancellation Note  Patient Details Name: KLEBER DIERSEN MRN: [id number] DOB: 1940/09/01   Cancelled Treatment:    Reason Eval/Treat Not Completed: Patient at procedure or test/unavailable patient in middle of changing from chest tube to mini-express system with RN, will try to attempt later if time/schedule allow.    Deniece Ree PT, DPT, CBIS  Supplemental Physical Therapist [facility]    Pager [phone] Acute Rehab Office [phone]

## 2018-11-20 NOTE — Progress Notes (Addendum)
[address]       [place] [address]             [phone]      11 Days Post-Op Procedure(s) (LRB): REDO VIDEO ASSISTED THORACOSCOPY (VATS)/DECORTICATION/DRAIN EFFUSION (Right) VIDEO BRONCHOSCOPY (N/A) Subjective: Denies SOB, no nausea, + BM  Objective: Vital signs in last 24 hours: Temp:  [97.7 F (36.5 C)-97.9 F (36.6 C)] 97.9 F (36.6 C) (10/13 0253) Pulse Rate:  [63-70] 64 (10/12 2352) Cardiac Rhythm: Normal sinus rhythm (10/13 0400) Resp:  [16-24] 24 (10/13 0253) BP: (107-169)/(60-72) 134/61 (10/13 0253) SpO2:  [93 %-100 %] 100 % (10/12 2108) Weight:  [59.4 kg] 59.4 kg (10/13 0500)  Hemodynamic parameters for last 24 hours:    Intake/Output from previous day: 10/12 0701 - 10/13 0700 In: 1397.1 [I.V.:46.5; NG/GT:1150; IV Piggyback:200.7] Out: 726 [Urine:725; Stool:1] Intake/Output this shift: No intake/output data recorded.  General appearance: alert, cooperative and no distress Heart: regular rate and rhythm Lungs: some ronchi on right, o/w clear Abdomen: benign Extremities: no edema or calf tenderness Wound: incis healing well  Lab Results: Recent Labs    11/20/18 0343  WBC 10.4  HGB 11.5*  HCT 36.2*  PLT 350   BMET:  Recent Labs    11/20/18 0343  NA 137  K 4.7  CL 102  CO2 28  GLUCOSE 105*  BUN 31*  CREATININE 0.71  CALCIUM 8.5*    PT/INR: No results for input(s): LABPROT, INR in the last 72 hours. ABG    Component Value Date/Time   PHART 7.374 11/10/2018 1223   HCO3 23.1 11/10/2018 1223   TCO2 24 11/10/2018 1223   ACIDBASEDEF 2.0 11/10/2018 1223   O2SAT 98.0 11/10/2018 1223   CBG (last 3)  Recent Labs    11/19/18 2018 11/19/18 2350 11/20/18 0516  GLUCAP 105* 97 106*    Meds Scheduled Meds: . atorvastatin  20 mg Per Tube QHS  . Chlorhexidine Gluconate Cloth  6 each Topical Daily  . sennosides  5 mL Per Tube QHS   And  . docusate  100 mg Per Tube QHS  . enoxaparin (LOVENOX) injection  40 mg  Subcutaneous Q24H  . feeding supplement (PRO-STAT SUGAR FREE 64)  30 mL Per Tube BID  . folic acid  1 mg Per Tube Daily  . gabapentin  300 mg Per Tube QHS  . insulin aspart  0-24 Units Subcutaneous Q6H  . metoprolol tartrate  12.5 mg Per Tube Daily  . mometasone-formoterol  2 puff Inhalation BID  . multivitamin  15 mL Per Tube Daily  . sertraline  200 mg Per Tube Daily  . sodium chloride flush  10-40 mL Intracatheter Q12H  . sodium chloride flush  10-40 mL Intracatheter Q12H  . traZODone  50 mg Per Tube QHS  . umeclidinium bromide  1 puff Inhalation Daily  . vitamin B-12  1,000 mcg Per Tube Daily   Continuous Infusions: . sodium chloride Stopped (11/20/18 0600)  . feeding supplement (OSMOLITE 1.5 CAL) 50 mL/hr at 11/20/18 0600  . meropenem (MERREM) IV 1 g (11/20/18 0514)   PRN Meds:.Place/Maintain arterial line **AND** sodium chloride, diclofenac sodium, lidocaine, morphine injection, ondansetron (ZOFRAN) IV, oxyCODONE, sodium chloride flush, sodium chloride flush, traMADol  Xrays Dg Chest Port 1 View  Result Date: 11/19/2018 CLINICAL DATA:  Follow-up empyema. Chest tubes. EXAM: PORTABLE CHEST 1 VIEW COMPARISON:  11/18/2018 FINDINGS: Left chest remains clear. Right-sided chest tube remains in place. Small amount of pleural  air at the right lateral base, slightly diminished. No worsening or new finding. Right arm PICC tip remains in the SVC. Soft feeding tube enters the abdomen. IMPRESSION: Small amount of pleural air at the right lateral base, slightly diminished. No worsening or new finding. Electronically Signed   By: Nelson Chimes M.D.   On: 11/19/2018 10:38    Assessment/Plan: S/P Procedure(s) (LRB): REDO VIDEO ASSISTED THORACOSCOPY (VATS)/DECORTICATION/DRAIN EFFUSION (Right) VIDEO BRONCHOSCOPY (N/A)  1 clinically very stable 2 change tube to mini-express today, CXR is stable and air-leak unchanged 3 no leukocytosis or fevers 4 H/H stable, minor anemia 5 renal fxn normal 6  BS well controlled 7 conts TF's 8 will get care manager to evaluate SNF vs ability to meet needs with home health  LOS: 14 days    Ebony Giovanni PA-C 11/20/2018 Pager [phone]  Patient seen and examined, agree with above Continue Speech CXR continues to show a small basilar space. I do not see a true air leak but there is extreme tidal variation c/w a space and underlying noncompliant lung. My concern is that if Ct is removed, the space will get reinfected. Will have to slowly advance tube out over time.  Revonda Standard Roxan Hockey, MD [facility] [phone]

## 2018-11-20 NOTE — Evaluation (Signed)
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 CT c/w empyema, also shows complicated effusion, atelectasis, and SQ emphysema. Received VATS/decortication/drain effusion 11/09/18. PMH HTN, COPD, OA, anxiety, R THA, knee arthroscopy  Clinical Impression   Patient received in bed, very pleasant and willing to work with therapy today. Able to perform bed mobility with S and assist for line/lead management, functional transfers with S and RW, and gait approximately 55ft in hallway with RW and min guard for safety. SOB with exertion but VSS on room air throughout session. Able to return to bed with S and assist for line management. He was left in bed with all needs met and questions/concerns addressed this afternoon. He will continue to benefit from skilled PT services in the acute setting, currently recommending skilled HHPT services moving forward.     Follow Up Recommendations Home health PT;Supervision for mobility/OOB    Equipment Recommendations  3in1 (PT);Rolling walker with 5" wheels    Recommendations for Other Services       Precautions / Restrictions Precautions Precautions: Fall;Other (comment) Precaution Comments: mini-express chest tube, watch sats Restrictions Weight Bearing Restrictions: No      Mobility  Bed Mobility Overal bed mobility: Needs Assistance Bed Mobility: Supine to Sit;Sit to Supine     Supine to sit: Supervision Sit to supine: Supervision   General bed mobility comments: S for safety, assist for line management  Transfers Overall transfer level: Needs assistance Equipment used: Rolling walker (2 wheeled) Transfers: Sit to/from Stand Sit to Stand: Supervision         General transfer comment: S for safety, cues for hand  placement, no physical assist given  Ambulation/Gait Ambulation/Gait assistance: Min guard Gait Distance (Feet): 80 Feet Assistive device: Rolling walker (2 wheeled) Gait Pattern/deviations: Step-through pattern;Trunk flexed Gait velocity: decreased   General Gait Details: gait generally WNL with decreased pace, SOB with exertion but VSS on room air  Stairs            Wheelchair Mobility    Modified Rankin (Stroke Patients Only)       Balance Overall balance assessment: Mild deficits observed, not formally tested                                           Pertinent Vitals/Pain Pain Assessment: No/denies pain Faces Pain Scale: No hurt    Home Living Family/patient expects to be discharged to:: Private residence Living Arrangements: Spouse/significant other Available Help at Discharge: Family;Available 24 hours/day Type of Home: House Home Access: Stairs to enter Entrance Stairs-Rails: Can reach both Entrance Stairs-Number of Steps: 3 Home Layout: One level Home Equipment: Walker - 2 wheels;Cane - single point;Shower seat - built in Additional Comments: no falls recently, was using the cane recently    Prior Function Level of Independence: Independent with assistive device(s)         Comments: driving and active in community before he got sick     Hand Dominance   Dominant Hand: Right    Extremity/Trunk Assessment   Upper Extremity Assessment Upper Extremity Assessment: Defer to OT evaluation    Lower Extremity Assessment Lower Extremity Assessment: Generalized weakness    Cervical / Trunk Assessment  Cervical / Trunk Assessment: Normal  Communication   Communication: No difficulties  Cognition Arousal/Alertness: Awake/alert Behavior During Therapy: WFL for tasks assessed/performed Overall Cognitive Status: Within Functional Limits for tasks assessed                                        General Comments       Exercises     Assessment/Plan    PT Assessment Patient needs continued PT services  PT Problem List Decreased strength;Decreased knowledge of use of DME;Decreased activity tolerance;Decreased mobility;Decreased coordination;Cardiopulmonary status limiting activity       PT Treatment Interventions DME instruction;Balance training;Gait training;Neuromuscular re-education;Stair training;Functional mobility training;Patient/family education;Therapeutic activities;Therapeutic exercise;Manual techniques    PT Goals (Current goals can be found in the Care Plan section)  Acute Rehab PT Goals Patient Stated Goal: go home PT Goal Formulation: With patient Time For Goal Achievement: 12/04/18 Potential to Achieve Goals: Good    Frequency Min 3X/week   Barriers to discharge        Co-evaluation               AM-PAC PT "6 Clicks" Mobility  Outcome Measure Help needed turning from your back to your side while in a flat bed without using bedrails?: A Little Help needed moving from lying on your back to sitting on the side of a flat bed without using bedrails?: A Little Help needed moving to and from a bed to a chair (including a wheelchair)?: A Little Help needed standing up from a chair using your arms (e.g., wheelchair or bedside chair)?: A Little Help needed to walk in hospital room?: A Little Help needed climbing 3-5 steps with a railing? : A Little 6 Click Score: 18    End of Session   Activity Tolerance: Patient tolerated treatment well Patient left: in bed;with call bell/phone within reach   PT Visit Diagnosis: Difficulty in walking, not elsewhere classified (R26.2);Muscle weakness (generalized) (M62.81)    Time: 4696-2952 PT Time Calculation (min) (ACUTE ONLY): 30 min   Charges:   PT Evaluation $PT Eval Moderate Complexity: 1 Mod PT Treatments $Gait Training: 8-22 mins        Nedra Hai PT, DPT, CBIS  Supplemental Physical Therapist [facility]     Pager [phone] Acute Rehab Office [phone]

## 2018-11-20 NOTE — Progress Notes (Signed)
CSW acknowledges consult to "determine if patient can go home with HH vs. SNF". CSW informed  PA that orders will need to be put in for PT and OT to see patient for them to determine appropriate discharge venue.   CSW will continue to follow for discharge planning needs once appropriate discharge venue is identified based on PT/OT recommendations.   [place], [place]

## 2018-11-21 ENCOUNTER — Inpatient Hospital Stay (HOSPITAL_COMMUNITY): Payer: Medicare HMO

## 2018-11-21 LAB — GLUCOSE, CAPILLARY
Glucose-Capillary: 98 mg/dL (ref 70–99)
Glucose-Capillary: 106 mg/dL — ABNORMAL HIGH (ref 70–99)
Glucose-Capillary: 105 mg/dL — ABNORMAL HIGH (ref 70–99)
Glucose-Capillary: 99 mg/dL (ref 70–99)
Glucose-Capillary: 99 mg/dL (ref 70–99)

## 2018-11-21 MED ORDER — FREE WATER
200.0000 mL | Freq: Four times a day (QID) | Status: DC
  Administered 2018-11-21 – 2018-11-22 (×7): 200 mL

## 2018-11-21 MED ORDER — OSMOLITE 1.5 CAL PO LIQD
1000.0000 mL | ORAL | Status: DC
  Administered 2018-11-21: 10:00:00 1000 mL
  Filled 2018-11-21 (×4): qty 1000

## 2018-11-21 NOTE — Progress Notes (Signed)
Speech Language Pathology Treatment: Dysphagia  Patient Details Name: William Kirk MRN: [id number] DOB: 1940-06-28 Today's Date: 11/21/2018 Time: 7829-5621 SLP Time Calculation (min) (ACUTE ONLY): 23 min  Assessment / Plan / Recommendation Clinical Impression  Verbal clearance was received from Dr. Dorris Fetch to start EMST to target muscles used in swallow and cough. Education and training was provided to pt and wife and settings were calibrated to 15 cm H20 based on pt's subjective ability. He completed five sets of five repetitions at that resistance with Min cues provided. No overt signs of intolerance and pt subjectively rated his effort level to be at an 8 out of 10. Written instructions were provided as he appears to be going home tomorrow. Would recommend that he receive [facility] SLP f/u to continue to work on his dysphagia followed by OP MBS to assess for potential recovery.    HPI HPI: Patient is a 78 y.o. male with PMH: COPD, tobacco abuse, recurrent PNA, bronchiectasis, recurrent hemoptysis.  He has had multiple recurrent PNA's over the past several years. He underwent a right lower lobectomy on 9/10. He presented to hospital on 9/29 with increasing right sided chest pain and SOB. CT revealed complex pleural effusion. He underwent a redo right thoractomy for drainage of the empyema. He was intubated for procedure on 10/2 and extubated on 10/3 at 1230. He has history of dysphagia with 2018 MBS report recommending nectar thick liquids and chin tuck with swallow.      SLP Plan  Continue with current plan of care       Recommendations  Diet recommendations: NPO;Other(comment)(few ice chips at a time after oral care) Medication Administration: Via alternative means                Oral Care Recommendations: Oral care QID Follow up Recommendations: Home health SLP SLP Visit Diagnosis: Dysphagia, pharyngeal phase (R13.13) Plan: Continue with current plan of care        GO                Virl Axe Yuliza Cara 11/21/2018, 2:34 PM  Ivar Drape, M.A. CCC-SLP Acute Herbalist [phone] Office [phone]

## 2018-11-21 NOTE — Progress Notes (Signed)
Nutrition Follow-up  DOCUMENTATION CODES:   Severe malnutrition in context of chronic illness  INTERVENTION:   Continue tube feeds via Cortrak: - Increase Osmolite 1.5 to 55 ml/hr (1320 ml/day) - Pro-stat 30 ml BID - Add free water flushes of 200 ml QID (verbal with readback order placed)  Tube feeding regimen and free water provides 2180 kcal, 113 grams of protein, and 1806 ml of H2O.  - RD will monitor for diet advancement and supplement as appropriate  NUTRITION DIAGNOSIS:   Severe Malnutrition related to chronic illness (COPD) as evidenced by severe fat depletion, severe muscle depletion.  Ongoing, being addressed via TF  GOAL:   Patient will meet greater than or equal to 90% of their needs  Met via TF  MONITOR:   Diet advancement, Labs, I & O's, TF tolerance, Weight trends, Skin, Supplement acceptance, PO intake  REASON FOR ASSESSMENT:   Consult Enteral/tube feeding initiation and management  ASSESSMENT:   Patient with PMH significant for COPD, bronchiectasis, recurrent PNA/hemoptysis, PAD, and GERD. Presents this admission with empyema.  9/29 - pleural drain placed IR 10/3 - R thoracotomy, drainage empyema, extubated 10/6 - Cortrak placed  10/9 - MBSS with recommendations for NPO  Noted possible d/c to home tomorrow if arrangements made. Pt will receive TF via Cortrak at home. SLP continues to recommend NPO.  Cortrak remains in place with TF infusing. Weight continues to trend down. RD to increase tube feeding rate.  Spoke with pt via phone call to room. Pt reports tube feeds are going well. Pt denies any N/V or abdominal pain or discomfort. Pt reports occasionally feeling thirsty.  Spoke with PA regarding addition of free water flushes which was approved.  Discussed plan with RN.  Current TF: Osmolite 1.5 @ 50 ml/hr, Pro-stat 30 ml BID  Medications reviewed and include: Senokot, folic acid, SSI q 6 hours, liquid MVI, vitamin B-12, IV abx  Labs  reviewed. CBG's: 83-106 x 24 hours  UOP: 1350 ml x 24 hours CT: 60 ml x 24 hours I/O's: +3.9 L since admit  Diet Order:   Diet Order            Diet NPO time specified Except for: Ice Chips  Diet effective now              EDUCATION NEEDS:   Education needs have been addressed  Skin:  Skin Assessment: Skin Integrity Issues: Stage III: bilateral ears Incisions: chest  Last BM:  11/20/18  Height:   Ht Readings from Last 1 Encounters:  11/09/18 5' 11"  (1.803 m)    Weight:   Wt Readings from Last 1 Encounters:  11/21/18 58.6 kg    Ideal Body Weight:  75.5 kg  BMI:  Body mass index is 18.02 kg/m.  Estimated Nutritional Needs:   Kcal:  2000-2200 kcal  Protein:  100-120 grams  Fluid:  >/= 2 L/day    Gaynell Face, MS, RD, LDN Inpatient Clinical Dietitian Pager: [phone] Weekend/After Hours: [phone]

## 2018-11-21 NOTE — Progress Notes (Signed)
      [address]       [place] [address]             [phone]       Possibly home tomorrow if arrangements made. HH PT,OT,SLP, and RN orders placed. The following will need to be addressed:  1. Tube feeds through his NG tube. NPO. SLP following 2. Mini express draining with luer lock syringe  3. IV antibiotics for 2 additional weeks through PICC 4. PT for debility  Social work consulted and following. Dr. Roxan Hockey discussed care with wife over the phone.

## 2018-11-21 NOTE — Progress Notes (Addendum)
      [address]       [place] [address]             [phone]      12 Days Post-Op Procedure(s) (LRB): REDO VIDEO ASSISTED THORACOSCOPY (VATS)/DECORTICATION/DRAIN EFFUSION (Right) VIDEO BRONCHOSCOPY (N/A) Subjective: Wants to go home and see his puppy.   Objective: Vital signs in last 24 hours: Temp:  [97.6 F (36.4 C)-98.2 F (36.8 C)] 97.6 F (36.4 C) (10/14 0338) Pulse Rate:  [64-71] 69 (10/14 0338) Cardiac Rhythm: Normal sinus rhythm (10/14 0400) Resp:  [16-24] 19 (10/14 0338) BP: (118-149)/(60-92) 149/67 (10/14 0338) SpO2:  [92 %-98 %] 92 % (10/14 0338) Weight:  [58.6 kg] 58.6 kg (10/14 0338)     Intake/Output from previous day: 10/13 0701 - 10/14 0700 In: 217.9 [I.V.:17.9; IV Piggyback:200] Out: [id number] [Urine:1350; Stool:1; Chest Tube:60] Intake/Output this shift: No intake/output data recorded.  General appearance: alert, cooperative and no distress Heart: regular rate and rhythm, S1, S2 normal, no murmur, click, rub or gallop Lungs: clear to auscultation bilaterally Abdomen: soft, non-tender; bowel sounds normal; no masses,  no organomegaly Extremities: extremities normal, atraumatic, no cyanosis or edema Wound: clean and dry  Lab Results: Recent Labs    11/20/18 0343  WBC 10.4  HGB 11.5*  HCT 36.2*  PLT 350   BMET:  Recent Labs    11/20/18 0343  NA 137  K 4.7  CL 102  CO2 28  GLUCOSE 105*  BUN 31*  CREATININE 0.71  CALCIUM 8.5*    PT/INR: No results for input(s): LABPROT, INR in the last 72 hours. ABG    Component Value Date/Time   PHART 7.374 11/10/2018 1223   HCO3 23.1 11/10/2018 1223   TCO2 24 11/10/2018 1223   ACIDBASEDEF 2.0 11/10/2018 1223   O2SAT 98.0 11/10/2018 1223   CBG (last 3)  Recent Labs    11/20/18 2321 11/21/18 0337 11/21/18 0626  GLUCAP 105* 98 106*    Assessment/Plan: S/P Procedure(s) (LRB): REDO VIDEO ASSISTED THORACOSCOPY (VATS)/DECORTICATION/DRAIN EFFUSION (Right) VIDEO  BRONCHOSCOPY (N/A)  1 clinically very stable, NSR in the 60s. BP well controlled 2 changed tube to mini-express, drainage 60cc/24 hours 3 no leukocytosis or fevers 4 H/H stable, minor anemia 5 renal fxn normal, creatinine 0.71 6 BS well controlled 7 conts TF's, remains NPO 8 PT recommending home health with home PT 9. On IV Merrem since 10/1, PICC line in place. IV abx for 2 more weeks.   Plan: Discharge planning. If he does go home will need ample assistance with mini express, tube feedings, home PT, and possible need for home abx.    LOS: 15 days    William Kirk 11/21/2018 Patient seen and examined, agree with above He continues to make progress and is getting stronger I talked to William Kirk regarding issues of tube feedings, chest tube and IV antibiotics. She is completely comfortable providing care at home with William Kirk and William Kirk. William Hockey, MD [facility] [phone]

## 2018-11-21 NOTE — Evaluation (Signed)
Occupational Therapy Evaluation Patient Details Name: William Kirk MRN: [id number] DOB: 08-11-1940 Today's Date: 11/21/2018    History of Present Illness 78yo male s/p R lower lobe lobectomy 10/18/18 due to bronchiectasis, now with SOB and drainage from his incision site. CT c/w empyema, also shows complicated effusion, atelectasis, and SQ emphysema. Received VATS/decortication/drain effusion 11/09/18. PMH HTN, COPD, OA, anxiety, R THA, knee arthroscopy   Clinical Impression   PTA patient independent with ADLs, mobility using cane. Admitted for above and limited by problem list below, including generalized weakness and decreased activity tolerance.  He requires min assist for LB ADLs, min guard for sustained standing grooming tasks, supervision for basic transfers. He has support of his spouse 24/7 as needed. He will benefit from continued OT services while admitted and after dc at [facility] level in order to maximize independence and safety with ADls/ IADLs.     Follow Up Recommendations  Home health OT;Supervision/Assistance - 24 hour    Equipment Recommendations  None recommended by OT    Recommendations for Other Services       Precautions / Restrictions Precautions Precautions: Fall;Other (comment) Precaution Comments: mini-express chest tube, watch sats Restrictions Weight Bearing Restrictions: No      Mobility Bed Mobility Overal bed mobility: Needs Assistance Bed Mobility: Supine to Sit     Supine to sit: Supervision     General bed mobility comments: S for safety, assist for line management  Transfers Overall transfer level: Needs assistance Equipment used: Rolling walker (2 wheeled) Transfers: Sit to/from Stand Sit to Stand: Supervision         General transfer comment: S for safety, cues for hand placement, no physical assist given    Balance Overall balance assessment: Mild deficits observed, not formally tested                                         ADL either performed or assessed with clinical judgement   ADL Overall ADL's : Needs assistance/impaired     Grooming: Min guard;Standing Grooming Details (indicate cue type and reason): min guard with sustained standing, fatigues easily  Upper Body Bathing: Set up;Sitting   Lower Body Bathing: Minimal assistance;Sit to/from stand   Upper Body Dressing : Set up;Sitting   Lower Body Dressing: Minimal assistance;Sit to/from stand Lower Body Dressing Details (indicate cue type and reason): requires assist with R sock, supervision sit to stand  Toilet Transfer: Supervision/safety;Ambulation;RW   Toileting- Clothing Manipulation and Hygiene: Minimal assistance;Sit to/from stand Toileting - Clothing Manipulation Details (indicate cue type and reason): min assist for peri care and clothing mgmt      Functional mobility during ADLs: Supervision/safety;Rolling walker General ADL Comments: pt limited by decreased activity tolerance, generalized weakness     Vision         Perception     Praxis      Pertinent Vitals/Pain Pain Assessment: No/denies pain     Hand Dominance Right   Extremity/Trunk Assessment Upper Extremity Assessment Upper Extremity Assessment: Generalized weakness   Lower Extremity Assessment Lower Extremity Assessment: Defer to PT evaluation   Cervical / Trunk Assessment Cervical / Trunk Assessment: Normal   Communication Communication Communication: No difficulties   Cognition Arousal/Alertness: Awake/alert Behavior During Therapy: WFL for tasks assessed/performed Overall Cognitive Status: Within Functional Limits for tasks assessed  General Comments  VSS, on RA O2 with good waveform >90%     Exercises     Shoulder Instructions      Home Living Family/patient expects to be discharged to:: Private residence Living Arrangements: Spouse/significant other Available Help at  Discharge: Family;Available 24 hours/day Type of Home: House Home Access: Stairs to enter [employer] of Steps: 3 Entrance Stairs-Rails: Can reach both Home Layout: One level     Bathroom Shower/Tub: Producer, television/film/video: Handicapped height     Home Equipment: Environmental consultant - 2 wheels;Cane - single point;Shower seat - built in;Grab bars - toilet;Grab bars - tub/shower   Additional Comments: no falls recently, was using the cane recently      Prior Functioning/Environment Level of Independence: Independent with assistive device(s)        Comments: used cane for mobility, driving and independent ADLs         OT Problem List: Decreased strength;Decreased activity tolerance;Cardiopulmonary status limiting activity;Decreased knowledge of use of DME or AE      OT Treatment/Interventions: Self-care/ADL training;DME and/or AE instruction;Therapeutic activities;Balance training;Patient/family education;Therapeutic exercise    OT Goals(Current goals can be found in the care plan section) Acute Rehab OT Goals Patient Stated Goal: go home OT Goal Formulation: With patient Time For Goal Achievement: 12/05/18 Potential to Achieve Goals: Good  OT Frequency: Min 2X/week   Barriers to D/C:            Co-evaluation              AM-PAC OT "6 Clicks" Daily Activity     Outcome Measure Help from another person eating meals?: Total(NPO) Help from another person taking care of personal grooming?: A Little Help from another person toileting, which includes using toliet, bedpan, or urinal?: A Little Help from another person bathing (including washing, rinsing, drying)?: A Little Help from another person to put on and taking off regular upper body clothing?: A Little Help from another person to put on and taking off regular lower body clothing?: A Little 6 Click Score: 16   End of Session Equipment Utilized During Treatment: Rolling walker Nurse Communication:  Mobility status  Activity Tolerance: Patient tolerated treatment well Patient left: in chair;with call bell/phone within reach  OT Visit Diagnosis: Unsteadiness on feet (R26.81);Muscle weakness (generalized) (M62.81)                Time: 4403-4742 OT Time Calculation (min): 24 min Charges:  OT General Charges $OT Visit: 1 Visit OT Evaluation $OT Eval Moderate Complexity: 1 Mod OT Treatments $Self Care/Home Management : 8-22 mins  Chancy Milroy, OT Acute Rehabilitation Services Pager [phone] Office [phone]   Chancy Milroy 11/21/2018, 10:52 AM

## 2018-11-21 NOTE — Plan of Care (Signed)
  Problem: Clinical Measurements: Goal: Ability to maintain clinical measurements within normal limits will improve Outcome: Progressing Goal: Diagnostic test results will improve Outcome: Progressing Goal: Respiratory complications will improve Outcome: Progressing Goal: Cardiovascular complication will be avoided Outcome: Progressing   

## 2018-11-21 NOTE — Progress Notes (Signed)
Provided teaching to Mr. Prillman and his wife about draining the Express Mini 500 with a luer lock syringe. Wife was able to return demonstrate placing the syringe on the luer lock, removing the drainage, measuring the drainage in the syringe and discarding the drainage in the toilet. Provided them with 2 50 cc luer lock syringes and some alcohol swabs. Spoke with patient's nurse, Angela Nevin, and explained the teaching that I did.

## 2018-11-21 NOTE — Discharge Instructions (Signed)
Health Maintenance After Age 76 After age 58, you are at a higher risk for certain long-term diseases and infections as well as injuries from falls. Falls are a major cause of broken bones and head injuries in people who are older than age 53. Getting regular preventive care can help to keep you healthy and well. Preventive care includes getting regular testing and making lifestyle changes as recommended by your health care provider. Talk with your health care provider about:  Which screenings and tests you should have. A screening is a test that checks for a disease when you have no symptoms.  A diet and exercise plan that is right for you. What should I know about screenings and tests to prevent falls? Screening and testing are the best ways to find a health problem early. Early diagnosis and treatment give you the best chance of managing medical conditions that are common after age 73. Certain conditions and lifestyle choices may make you more likely to have a fall. Your health care provider may recommend:  Regular vision checks. Poor vision and conditions such as cataracts can make you more likely to have a fall. If you wear glasses, make sure to get your prescription updated if your vision changes.  Medicine review. Work with your health care provider to regularly review all of the medicines you are taking, including over-the-counter medicines. Ask your health care provider about any side effects that may make you more likely to have a fall. Tell your health care provider if any medicines that you take make you feel dizzy or sleepy.  Osteoporosis screening. Osteoporosis is a condition that causes the bones to get weaker. This can make the bones weak and cause them to break more easily.  Blood pressure screening. Blood pressure changes and medicines to control blood pressure can make you feel dizzy.  Strength and balance checks. Your health care provider may recommend certain tests to check  your strength and balance while standing, walking, or changing positions.  Foot health exam. Foot pain and numbness, as well as not wearing proper footwear, can make you more likely to have a fall.  Depression screening. You may be more likely to have a fall if you have a fear of falling, feel emotionally low, or feel unable to do activities that you used to do.  Alcohol use screening. Using too much alcohol can affect your balance and may make you more likely to have a fall. What actions can I take to lower my risk of falls? General instructions  Talk with your health care provider about your risks for falling. Tell your health care provider if: ? You fall. Be sure to tell your health care provider about all falls, even ones that seem minor. ? You feel dizzy, sleepy, or off-balance.  Take over-the-counter and prescription medicines only as told by your health care provider. These include any supplements.  Eat a healthy diet and maintain a healthy weight. A healthy diet includes low-fat dairy products, low-fat (lean) meats, and fiber from whole grains, beans, and lots of fruits and vegetables. Home safety  Remove any tripping hazards, such as rugs, cords, and clutter.  Install safety equipment such as grab bars in bathrooms and safety rails on stairs.  Keep rooms and walkways well-lit. Activity   Follow a regular exercise program to stay fit. This will help you maintain your balance. Ask your health care provider what types of exercise are appropriate for you.  If you need a cane  or walker, use it as recommended by your health care provider.  Wear supportive shoes that have nonskid soles. Lifestyle  Do not drink alcohol if your health care provider tells you not to drink.  If you drink alcohol, limit how much you have: ? 0-1 drink a day for women. ? 0-2 drinks a day for men.  Be aware of how much alcohol is in your drink. In the [place] one drink equals one typical bottle of  beer (12 oz), one-half glass of wine (5 oz), or one shot of hard liquor (1 oz).  Do not use any products that contain nicotine or tobacco, such as cigarettes and e-cigarettes. If you need help quitting, ask your health care provider. Summary  Having a healthy lifestyle and getting preventive care can help to protect your health and wellness after age 50.  Screening and testing are the best way to find a health problem early and help you avoid having a fall. Early diagnosis and treatment give you the best chance for managing medical conditions that are more common for people who are older than age 19.  Falls are a major cause of broken bones and head injuries in people who are older than age 71. Take precautions to prevent a fall at home.  Work with your health care provider to learn what changes you can make to improve your health and wellness and to prevent falls. This information is not intended to replace advice given to you by your health care provider. Make sure you discuss any questions you have with your health care provider. Document Released: 12/07/2016 Document Revised: 05/17/2018 Document Reviewed: 12/07/2016 Elsevier Patient Education  2020 [employer].   Chest Tube One-Way Valve Home Guide  A one-way valve is a device that helps drain extra air and fluid from the chest. The valve connects to a chest tube that is inserted into the area surrounding the lungs (pleural space). The valve lets the extra air and fluid out of the chest and stops new air or fluid from getting in through the chest tube. The other end of the one-way valve is sometimes connected to a drainage bag or container to collect fluid that drains from the chest. You may need a one-way valve if:  You have a collapsed lung (pneumothorax), which causes extra air in the pleural space.  You had surgery that affected your lungs and there is extra air or fluid that needs to be drained. There are different types of  one-way valves such as:  A Heimlich valve. This is also called a flutter valve.  A Pneumostat. This is also called a chest drain valve. How to care for the valve and chest tube  Keep the valve and tube secured to your skin as told by your health care provider.  Make sure the valve opening is always clear so that air can get out. Do not block the opening with anything (such as tape) that would stop the flow of air.  You may hear the valve make noises as air or fluid passes through it.  Remove or change bandages (dressings) around your chest tube only as told by your health care provider.  Gently clean the skin near your tube and valve using mild soap and water. Do not use any powders, creams, or scented soaps near your tube or valve.  If your valve is connected to a drainage bag or container: ? Make sure the vent for the bag or container does not become blocked. ?  Keep track of the amount of fluid that drains. You may be asked to write down the day, time, and amount of fluid each time you empty the bag or container. ? Empty the fluid from the bag or container into the toilet as told by your health care provider. Follow any special instructions for the type of one-way valve you have. Follow these instructions at home:  Do not take baths, swim, or use a hot tub until your health care provider approves. Take showers instead. When you shower, make sure the tube, valve, and dressings are covered with a watertight covering.  Wear loose, comfortable clothing that does not block the valve opening.  Ask your health care provider what activities are safe for you. You may need to: ? Avoid activities that take a lot of energy (strenuous activities). ? Avoid lifting anything that is heavier than 10 lb (4.5 kg), or the limit that you are told.  Avoid air travel until your health care provider says it is safe for you.  Keep all follow-up visits as told by your health care provider. This is  important. Contact a health care provider if:  You have trouble caring for your chest tube and one-way valve.  Your valve becomes disconnected from the chest tube. If this occurs, reconnect the valve and contact your health care provider.  Your chest tube comes loose or gets pulled out.  You have any of the following: ? A fever or chills. ? Redness, swelling, or pain around your tube or valve area. ? Chest pain. ? Pressure or cramping in your chest. ? Shortness of breath that does not go away. ? New symptoms. Get help right away if you have:  Chest pain.  Trouble breathing. Summary  A one-way valve is a device that helps drain extra air and fluid from your chest.  The valve lets the extra air and fluid out of the chest and stops new air or fluid from getting in through the chest tube.  The valve connects to a chest tube that is inserted into the area surrounding your lungs (pleural space).  Keep the valve and tube secured to your skin as told by your health care provider.  Make sure the valve opening is always clear so that air can get out. Do not block the opening with anything, such as tape. This information is not intended to replace advice given to you by your health care provider. Make sure you discuss any questions you have with your health care provider. Document Released: 08/21/2013 Document Revised: 02/24/2017 Document Reviewed: 02/24/2017 Elsevier Patient Education  2020 [place]. Lung Abscess  A lung abscess is a space (cavity) that forms inside the lung and fills with dead tissue and pus. It results from an infection that gets into the lung. A lung abscess can cause symptoms such as fatigue, fever, and a cough that brings up fluid from the lungs (sputum). A lung abscess usually occurs in just one lung. A person who has a lung abscess may need treatment for several weeks. A lung abscess that lasts longer than 6 weeks is considered chronic. What are the  causes? This condition is usually caused by bacteria. In most cases, it is caused by more than one type of bacteria. Bacteria can get into the lungs by:  Saliva, vomit, or liquid that is contaminated with bacteria being inhaled (aspirated) into the lungs.  Spreading from a blood infection (sepsis). This can sometimes cause abscesses in both lungs. What  increases the risk? The following factors may make you more likely to develop this condition:  Having a medical condition that weakens the body's defense system (immune system).  Having a dental, gum, or sinus infection.  Having poor oral hygiene.  Aspiration. You may be at risk for aspiration if you: ? Have a nervous system condition that interferes with coughing or swallowing. ? Have a tube in your airway for breathing (tracheal tube or endotracheal tube). ? Are very drowsy or unconscious because of a medicine (sedative) or drinking too much alcohol.  Chronic lung conditions, such as chronic obstructive pulmonary disease (COPD), cancer, or cystic fibrosis.  Aging. What are the signs or symptoms? Early symptoms of this condition are similar to the symptoms of other lung infections, such as pneumonia. These include:  Fever.  Chills.  Night sweats.  A cough that brings up sputum.  Shortness of breath.  Fatigue. Symptoms of a chronic lung abscess also include:  Weight loss.  Chest pain.  Coughing up sputum that smells bad, is discolored, or is tinged with blood. How is this diagnosed? This condition may be diagnosed based on:  A physical exam and medical history. During the exam, your health care provider will use a stethoscope to listen to your lungs and check for abnormal sounds in the abscess area.  Medical tests, such as: ? Chest X-rays to check for a cavity that is filled with fluid and air. ? Blood tests to look for signs of infection. ? Blood and sputum cultures to test for bacteria. ? Blood tests to measure  the oxygen level in your blood (arterial blood gases, or ABG). ? A procedure to examine your lung and take samples of fluid or tissue using an endoscopic tube (bronchoscope). ? Imaging studies of your lung, such as a CT scan or ultrasound. How is this treated? This condition may be treated with:  Antibiotic medicines. This is usually the first treatment. ? First, these medicines may be given directly into a vein through an IV. Treatment with antibiotics may start with an antibiotic that is known to work against many different kinds of bacteria (broad-spectrum antibiotic). ? Second, your IV antibiotics may be changed if tests identify the germs and show that another type of antibiotic may be more effective. You may have to continue antibiotics in the hospital or at home for several weeks.  Antifungal medicines. These may be given if a fungus is present in the abscess.  Supportive care, such as: ? IV fluids and nutrition. ? Oxygen therapy.  A procedure to drain the abscess using a bronchoscope or drainage tube.  Open lung surgery to drain the abscess and remove dead lung tissue. This is rare. Follow these instructions at home: Medicines   Take over-the-counter and prescription medicines only as told by your health care provider.  Take your antibiotic medicine as told by your health care provider. Do not stop taking the antibiotic even if you start to feel better. Lifestyle  Do not abuse drugs or alcohol.  Do not use any products that contain nicotine or tobacco, such as cigarettes and e-cigarettes. If you need help quitting, ask your health care provider.  Brush your teeth every morning and night with fluoride toothpaste and floss once a day. See a dentist regularly.  Eat a healthy diet. Ask your health care provider what foods are right for you.  Rest at home until your health care provider says that you can return to your normal activities. General instructions  Drink enough  fluids to keep your urine pale yellow.  Do breathing exercises as told by your health care provider. This may include: ? Changing positions to help drain the infected area (postural drainage). ? Using a breathing device (incentive spirometer) to help you take very deep breaths.  Keep all follow-up visits as told by your health care provider. This is important. Get help right away if you:  Have a fever.  Have a cough that is not going away or is getting worse.  Cough up blood.  Have shortness of breath that is not going away or is getting worse.  Have chest pain. These symptoms may represent a serious problem that is an emergency. Do not wait to see if the symptoms will go away. Get medical help right away. Call your local emergency services (911 in the [place] Do not drive yourself to the hospital. Summary  A lung abscess is a cavity in the lung that fills with dead tissue and pus due to an infection.  Antibiotic therapy is the main treatment for a lung abscess. Do not stop taking the antibiotic even if you start to feel better.  As you heal, your health care provider may recommend special lung exercises to help improve your breathing and encourage drainage of the infection from your lung.  Get help right away if you develop a fever or have a cough that is not going away or is getting worse. This information is not intended to replace advice given to you by your health care provider. Make sure you discuss any questions you have with your health care provider. Document Released: 07/14/2009 Document Revised: 03/03/2017 Document Reviewed: 02/09/2017 Elsevier Patient Education  2020 [employer].

## 2018-11-22 LAB — GLUCOSE, CAPILLARY
Glucose-Capillary: 104 mg/dL — ABNORMAL HIGH (ref 70–99)
Glucose-Capillary: 105 mg/dL — ABNORMAL HIGH (ref 70–99)
Glucose-Capillary: 107 mg/dL — ABNORMAL HIGH (ref 70–99)
Glucose-Capillary: 126 mg/dL — ABNORMAL HIGH (ref 70–99)
Glucose-Capillary: 77 mg/dL (ref 70–99)

## 2018-11-22 MED ORDER — TRAZODONE HCL 50 MG PO TABS: 50.0000 mg | ORAL_TABLET | Freq: Every day | ORAL | 1 refills | Status: DC

## 2018-11-22 MED ORDER — OSMOLITE 1.5 CAL PO LIQD: 1000.0000 mL | ORAL | 30 refills | Status: AC

## 2018-11-22 MED ORDER — LEVALBUTEROL HCL 0.63 MG/3ML IN NEBU: 0.6300 mg | INHALATION_SOLUTION | RESPIRATORY_TRACT | 12 refills | Status: DC | PRN

## 2018-11-22 MED ORDER — OXYCODONE HCL 5 MG/5ML PO SOLN: 5.0000 mg | Freq: Two times a day (BID) | ORAL | 0 refills | Status: AC | PRN

## 2018-11-22 MED ORDER — FREE WATER: 200.0000 mL | Freq: Four times a day (QID) | 0 refills | Status: AC

## 2018-11-22 MED ORDER — ADULT MULTIVITAMIN LIQUID CH: 15.0000 mL | Freq: Every day | ORAL | 0 refills | Status: AC

## 2018-11-22 MED ORDER — METOPROLOL TARTRATE 25 MG/10 ML ORAL SUSPENSION: 12.5000 mg | Freq: Every day | ORAL | 0 refills | Status: DC

## 2018-11-22 MED ORDER — FOLIC ACID 1 MG PO TABS: 1.0000 mg | ORAL_TABLET | Freq: Every day | ORAL | 1 refills | Status: DC

## 2018-11-22 MED ORDER — SODIUM CHLORIDE 0.9 % IV SOLN
1.0000 g | INTRAVENOUS | Status: DC
  Administered 2018-11-22: 1000 mg via INTRAVENOUS
  Filled 2018-11-22: qty 1

## 2018-11-22 MED ORDER — PRO-STAT SUGAR FREE PO LIQD: 30.0000 mL | Freq: Two times a day (BID) | ORAL | 0 refills | Status: DC

## 2018-11-22 MED ORDER — SERTRALINE HCL 100 MG PO TABS: 200.0000 mg | ORAL_TABLET | Freq: Every day | ORAL | 0 refills | Status: AC

## 2018-11-22 MED ORDER — ATORVASTATIN CALCIUM 20 MG PO TABS: 20.0000 mg | ORAL_TABLET | Freq: Every day | ORAL | 1 refills | Status: DC

## 2018-11-22 MED ORDER — DOCUSATE SODIUM 50 MG/5ML PO LIQD: 100.0000 mg | Freq: Every day | ORAL | 0 refills | Status: DC

## 2018-11-22 MED ORDER — CYANOCOBALAMIN 1000 MCG PO TABS: 1000.0000 ug | ORAL_TABLET | Freq: Every day | ORAL | 1 refills | Status: DC

## 2018-11-22 MED ORDER — SODIUM CHLORIDE 0.9 % IV SOLN: 1.0000 g | INTRAVENOUS | 0 refills | Status: DC

## 2018-11-22 MED ORDER — GABAPENTIN 250 MG/5ML PO SOLN: 300.0000 mg | Freq: Every day | ORAL | 0 refills | Status: DC

## 2018-11-22 MED ORDER — ERTAPENEM IV (FOR PTA / DISCHARGE USE ONLY): 1.0000 g | INTRAVENOUS | 0 refills | Status: AC

## 2018-11-22 NOTE — Progress Notes (Signed)
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 CT c/w empyema, also shows complicated effusion, atelectasis, and SQ emphysema. Received VATS/decortication/drain effusion 11/09/18. PMH HTN, COPD, OA, anxiety, R THA, knee arthroscopy    PT Comments    Patient seen for mobility progression. Current plan remains appropriate.     Follow Up Recommendations  Home health PT;Supervision for mobility/OOB     Equipment Recommendations  3in1 (PT);Rolling walker with 5" wheels    Recommendations for Other Services       Precautions / Restrictions Precautions Precautions: Fall;Other (comment) Precaution Comments: mini-express chest tube, watch sats Restrictions Weight Bearing Restrictions: No    Mobility  Bed Mobility Overal bed mobility: Modified Independent Bed Mobility: Supine to Sit;Sit to Supine           General bed mobility comments: increased time; HOB elevated 30 degrees  Transfers Overall transfer level: Needs assistance Equipment used: Rolling walker (2 wheeled) Transfers: Sit to/from Stand Sit to Stand: Supervision         General transfer comment: cues for safe hand placement  Ambulation/Gait Ambulation/Gait assistance: Min guard Gait Distance (Feet): 200 Feet Assistive device: Rolling walker (2 wheeled) Gait Pattern/deviations: Step-through pattern;Trunk flexed Gait velocity: decreased   General Gait Details: unsteady gait especially with turning but no LOB with use of bilat UE support; SOB with SpO2 >90% on RA    Stairs             Wheelchair Mobility    Modified Rankin (Stroke Patients Only)       Balance Overall balance assessment: Mild deficits observed, not formally tested                                           Cognition Arousal/Alertness: Awake/alert Behavior During Therapy: WFL for tasks assessed/performed Overall Cognitive Status: Within Functional Limits for tasks assessed                                        Exercises      General Comments        Pertinent Vitals/Pain Pain Assessment: No/denies pain    Home Living                      Prior Function            PT Goals (current goals can now be found in the care plan section) Acute Rehab PT Goals Patient Stated Goal: go home Progress towards PT goals: Progressing toward goals    Frequency    Min 3X/week      PT Plan Current plan remains appropriate    Co-evaluation              AM-PAC PT "6 Clicks" Mobility   Outcome Measure  Help needed turning from your back to your side while in a flat bed without using bedrails?: A Little Help needed moving from lying on your back to sitting on the side of a flat bed without using bedrails?: A Little Help needed moving to and from a bed to a chair (including a  wheelchair)?: A Little Help needed standing up from a chair using your arms (e.g., wheelchair or bedside chair)?: A Little Help needed to walk in hospital room?: A Little Help needed climbing 3-5 steps with a railing? : A Little 6 Click Score: 18    End of Session Equipment Utilized During Treatment: Gait belt Activity Tolerance: Patient tolerated treatment well Patient left: in bed;with call bell/phone within reach Nurse Communication: Mobility status PT Visit Diagnosis: Difficulty in walking, not elsewhere classified (R26.2);Muscle weakness (generalized) (M62.81)     Time: 4098-1191 PT Time Calculation (min) (ACUTE ONLY): 21 min  Charges:  $Gait Training: 8-22 mins                     Erline Levine, PTA Acute Rehabilitation Services Pager: [phone] Office: [phone]     Carolynne Edouard 11/22/2018, 12:04 PM

## 2018-11-22 NOTE — Progress Notes (Signed)
PHARMACY CONSULT NOTE FOR:  OUTPATIENT  PARENTERAL ANTIBIOTIC THERAPY (OPAT)  Indication: empyema s/p VATS Regimen: ertapenem 1 g Q24H End date: 12/06/2018, 2 more weeks from today per PA note  Discussed with PA and switched meropenem to ertapenem for ease of administration at home.  IV antibiotic discharge orders are pended. To discharging provider:  please sign these orders via discharge navigator,  Select New Orders & click on the button choice - Manage This Unsigned Work.     Thank you for allowing pharmacy to be a part of this patient's care.  William Kirk 11/22/2018, 10:03 AM

## 2018-11-22 NOTE — Progress Notes (Signed)
Pt left unit in wheelchair , accompanied by Rn.coretrak and Picc line  in place.

## 2018-11-22 NOTE — TOC Progression Note (Signed)
Transition of Care [facility]) - Progression Note    Patient Details  Name: CLEMENCE VERMEER MRN: [id number] Date of Birth: 11-07-1940  [facility] CM/SW Contact  Zenon Mayo, RN Phone Number: 11/22/2018, 1:25 PM  Clinical Narrative:    NCM contacted Dorian Pod with [facility] for [place], [place], [place], [place] and [place].  NCM made referral to [facility] with [employer] for tube feeding supplies and pump.  Patient will go home with iv abx, Carolynn Sayers is providing medications thru the [place].    Expected Discharge Plan: Home/Self Care Barriers to Discharge: No Barriers Identified  Expected Discharge Plan and Services Expected Discharge Plan: Home/Self Care In-house Referral: NA Discharge Planning Services: CM Consult Post Acute Care Choice: NA Living arrangements for the past 2 months: Single Family Home                           HH Arranged: NA           Social Determinants of Health (SDOH) Interventions    Readmission Risk Interventions Readmission Risk Prevention Plan 11/08/2018 10/23/2018  Transportation Screening Complete Complete  PCP or Specialist Appt within 3-5 Days Complete Complete  HRI or [place] Complete Complete  Social Work Consult for [place] Planning/Counseling Complete Complete  Palliative Care Screening Not Applicable Not Applicable  Medication Review Press photographer) Complete Complete  Some recent data might be hidden

## 2018-11-22 NOTE — Progress Notes (Addendum)
Wife of patient shown with hands on , the use of the kangaroo, all relevant questions were answered.

## 2018-11-22 NOTE — Progress Notes (Signed)
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 CT c/w empyema, also shows complicated effusion, atelectasis, and SQ emphysema. Received VATS/decortication/drain effusion 11/09/18. PMH HTN, COPD, OA, anxiety, R THA, knee arthroscopy   OT comments  Pt agreeable to OOB to bathroom and then to sink for grooming. Educated in importance of participating in ADL with intermittent rest breaks and sitting when activity requires > 5 minutes. Pt declined remaining up in chair due to its discomfort, but report he does have a comfortable chair at home and will not stay in the bed for extended periods. Pt is eager to discharge with his supportive wife.  Follow Up Recommendations  Home health OT;Supervision/Assistance - 24 hour    Equipment Recommendations  None recommended by OT    Recommendations for Other Services      Precautions / Restrictions Precautions Precautions: Fall Precaution Comments: mini-express chest tube, watch sats, cortrack Restrictions Weight Bearing Restrictions: No       Mobility Bed Mobility Overal bed mobility: Modified Independent             General bed mobility comments: HOB 30 degrees  Transfers   Equipment used: Rolling walker (2 wheeled) Transfers: Sit to/from Stand Sit to Stand: Supervision         General transfer comment: supervision for safety    Balance                                           ADL either performed or assessed with clinical judgement   ADL Overall ADL's : Needs assistance/impaired Eating/Feeding: NPO   Grooming: Supervision/safety;Standing;Wash/dry hands           Upper Body Dressing : Set up;Sitting       Toilet Transfer: Supervision/safety;Ambulation;RW   Toileting-  Clothing Manipulation and Hygiene: Supervision/safety;Sit to/from stand       Functional mobility during ADLs: Supervision/safety;Rolling walker General ADL Comments: pt educated in energy conservation and importance of participating in ADL to increase endurance, recommended placing 3 in 1 in front of sink for sponge bathing at home     Vision       Perception     Praxis      Cognition Arousal/Alertness: Awake/alert Behavior During Therapy: WFL for tasks assessed/performed Overall Cognitive Status: Within Functional Limits for tasks assessed                                          Exercises     Shoulder Instructions       General Comments      Pertinent Vitals/ Pain       Pain Assessment: Faces Faces Pain Scale: No hurt Pain Intervention(s): Monitored during session  Home Living                                          Prior Functioning/Environment              Frequency  Min 2X/week  Progress Toward Goals  OT Goals(current goals can now be found in the care plan section)  Progress towards OT goals: Progressing toward goals  Acute Rehab OT Goals Patient Stated Goal: go home OT Goal Formulation: With patient Time For Goal Achievement: 12/05/18 Potential to Achieve Goals: Good  Plan Discharge plan remains appropriate    Co-evaluation                 AM-PAC OT "6 Clicks" Daily Activity     Outcome Measure   Help from another person eating meals?: Total Help from another person taking care of personal grooming?: A Little Help from another person toileting, which includes using toliet, bedpan, or urinal?: A Little Help from another person bathing (including washing, rinsing, drying)?: A Little Help from another person to put on and taking off regular upper body clothing?: None Help from another person to put on and taking off regular lower body clothing?: A Little 6 Click Score: 17    End of  Session Equipment Utilized During Treatment: Rolling walker  OT Visit Diagnosis: Unsteadiness on feet (R26.81);Muscle weakness (generalized) (M62.81)   Activity Tolerance Patient tolerated treatment well   Patient Left in bed;with call bell/phone within reach;with family/visitor present   Nurse Communication          Time: 4540-9811 OT Time Calculation (min): 16 min  Charges: OT General Charges $OT Visit: 1 Visit OT Treatments $Self Care/Home Management : 8-22 mins  Martie Round, OTR/L Acute Rehabilitation Services Pager: [phone] Office: [phone]   William Kirk 11/22/2018, 4:13 PM

## 2018-11-22 NOTE — Progress Notes (Addendum)
      [address]       [place] [address]             [phone]      13 Days Post-Op Procedure(s) (LRB): REDO VIDEO ASSISTED THORACOSCOPY (VATS)/DECORTICATION/DRAIN EFFUSION (Right) VIDEO BRONCHOSCOPY (N/A) Subjective: Feels good this morning. No complaints.  Objective: Vital signs in last 24 hours: Temp:  [97.3 F (36.3 C)-98 F (36.7 C)] 97.6 F (36.4 C) (10/15 0305) Pulse Rate:  [61-66] 64 (10/15 0305) Cardiac Rhythm: Normal sinus rhythm (10/15 0700) Resp:  [17-20] 17 (10/15 0305) BP: (127-145)/(56-97) 145/56 (10/15 0305) SpO2:  [93 %-96 %] 95 % (10/15 0305) Weight:  [59.1 kg] 59.1 kg (10/15 0305)     Intake/Output from previous day: 10/14 0701 - 10/15 0700 In: 2330.5 [NG/GT:2128.3; IV Piggyback:202.1] Out: 890 [Urine:850; Emesis/NG output:40] Intake/Output this shift: No intake/output data recorded.  General appearance: alert, cooperative and no distress Heart: regular rate and rhythm, S1, S2 normal, no murmur, click, rub or gallop Lungs: clear to auscultation bilaterally Abdomen: soft, non-tender; bowel sounds normal; no masses,  no organomegaly Extremities: extremities normal, atraumatic, no cyanosis or edema Wound: clean and dry  Lab Results: Recent Labs    11/20/18 0343  WBC 10.4  HGB 11.5*  HCT 36.2*  PLT 350   BMET:  Recent Labs    11/20/18 0343  NA 137  K 4.7  CL 102  CO2 28  GLUCOSE 105*  BUN 31*  CREATININE 0.71  CALCIUM 8.5*    PT/INR: No results for input(s): LABPROT, INR in the last 72 hours. ABG    Component Value Date/Time   PHART 7.374 11/10/2018 1223   HCO3 23.1 11/10/2018 1223   TCO2 24 11/10/2018 1223   ACIDBASEDEF 2.0 11/10/2018 1223   O2SAT 98.0 11/10/2018 1223   CBG (last 3)  Recent Labs    11/21/18 1942 11/22/18 0014 11/22/18 0610  GLUCAP 99 105* 126*    Assessment/Plan: S/P Procedure(s) (LRB): REDO VIDEO ASSISTED THORACOSCOPY (VATS)/DECORTICATION/DRAIN EFFUSION (Right) VIDEO  BRONCHOSCOPY (N/A)   1 clinically very stable, NSR in the 60s. BP well controlled 2 changed tube to mini-express, scant drainage 3 no leukocytosis or fevers 4 H/H stable, minor anemia 5 renal fxn normal, creatinine 0.71 6 BS well controlled 7 conts TF's, remains NPO 8 PT recommending home health with home PT 9. On IV Merrem since 10/1, PICC line in place. IV abx for 2 more weeks.   Plan: He is anxious for home today. No note from case management-I will wait to hear from them that home health services including home abx and TF are set up. His wife was able to receive some education on the mini express yesterday.    LOS: 16 days    [name] 11/22/2018 Patient seen and examined, agree with above Will dc once all arrangements for antibiotics, tube feedings, PT, and Speech therapy are complete I will see him back in the office early next week with a CXR.  Revonda Standard Roxan Hockey, MD [facility] [phone]

## 2018-11-22 NOTE — TOC Transition Note (Addendum)
Transition of Care [facility]) - CM/SW Discharge Note   Patient Details  Name: William Kirk MRN: [id number] Date of Birth: Feb 16, 1940  Transition of Care [facility]) CM/SW Contact:  Zenon Mayo, RN Phone Number: 11/22/2018, 4:19 PM   Clinical Narrative:    Patient for possible discharge today if can get the authorization for the iv abx from [place].  NCM offered choice to wife, she states she has no preference for choice  , who ever is available to staff him.   Patient is set up with [facility] for RN, PT, OT, ST and SW . Soc will begin 24 to 48 hrs post dc.  Patient is set up with [facility] for tube feeding supplies and pump.  RN is to send patient home with syringes for mini express chest tube.  Deanna with the [facility] will contact Carolynn Sayers with the authorization for iv abxs, once we have that patient will be ready to discharge home via car with wife.      Final next level of care: [place] Barriers to Discharge: (awaiting auth from [place] for abx)   Patient Goals and CMS Choice Patient states their goals for this hospitalization and ongoing recovery are:: go home CMS Medicare.gov Compare Post Acute Care list provided to:: Patient Represenative (must comment)(wife) Choice offered to / list presented to : Spouse  Discharge Placement                       Discharge Plan and Services In-house Referral: NA Discharge Planning Services: CM Consult Post Acute Care Choice: Home Health          DME Arranged: Tube feeding pump, Tube feeding DME Agency: [facility] Date DME Agency Contacted: 11/22/18 Time DME Agency Contacted: [phone] Representative spoke with at DME Agency: zack HH Arranged: RN, PT, OT, Speech Therapy, IV Antibiotics HH Agency: [facility] Date [place]: 11/22/18 Time [place]: 1618 Representative spoke with at [place]: [place] ([place]) Interventions     Readmission Risk Interventions Readmission Risk  Prevention Plan 11/08/2018 10/23/2018  Transportation Screening Complete Complete  PCP or Specialist Appt within 3-5 Days Complete Complete  HRI or [place] Complete Complete  Social Work Consult for [place] Planning/Counseling Complete Complete  Palliative Care Screening Not Applicable Not Applicable  Medication Review Press photographer) Complete Complete  Some recent data might be hidden

## 2018-11-23 DIAGNOSIS — E871 Hypo-osmolality and hyponatremia: Secondary | ICD-10-CM | POA: Diagnosis not present

## 2018-11-23 DIAGNOSIS — E43 Unspecified severe protein-calorie malnutrition: Secondary | ICD-10-CM | POA: Diagnosis not present

## 2018-11-24 DIAGNOSIS — Z452 Encounter for adjustment and management of vascular access device: Secondary | ICD-10-CM | POA: Diagnosis not present

## 2018-11-24 DIAGNOSIS — I4891 Unspecified atrial fibrillation: Secondary | ICD-10-CM | POA: Diagnosis not present

## 2018-11-24 DIAGNOSIS — J869 Pyothorax without fistula: Secondary | ICD-10-CM | POA: Diagnosis not present

## 2018-11-24 DIAGNOSIS — J449 Chronic obstructive pulmonary disease, unspecified: Secondary | ICD-10-CM | POA: Diagnosis not present

## 2018-11-24 DIAGNOSIS — Z4682 Encounter for fitting and adjustment of non-vascular catheter: Secondary | ICD-10-CM | POA: Diagnosis not present

## 2018-11-24 DIAGNOSIS — I10 Essential (primary) hypertension: Secondary | ICD-10-CM | POA: Diagnosis not present

## 2018-11-24 DIAGNOSIS — I739 Peripheral vascular disease, unspecified: Secondary | ICD-10-CM | POA: Diagnosis not present

## 2018-11-24 DIAGNOSIS — J9611 Chronic respiratory failure with hypoxia: Secondary | ICD-10-CM | POA: Diagnosis not present

## 2018-11-24 DIAGNOSIS — L89322 Pressure ulcer of left buttock, stage 2: Secondary | ICD-10-CM | POA: Diagnosis not present

## 2018-11-24 DIAGNOSIS — T8143XA Infection following a procedure, organ and space surgical site, initial encounter: Secondary | ICD-10-CM | POA: Diagnosis not present

## 2018-11-26 ENCOUNTER — Other Ambulatory Visit: Payer: Self-pay | Admitting: Thoracic Surgery (Cardiothoracic Vascular Surgery)

## 2018-11-26 DIAGNOSIS — J869 Pyothorax without fistula: Secondary | ICD-10-CM | POA: Diagnosis not present

## 2018-11-26 DIAGNOSIS — J9611 Chronic respiratory failure with hypoxia: Secondary | ICD-10-CM | POA: Diagnosis not present

## 2018-11-26 DIAGNOSIS — I10 Essential (primary) hypertension: Secondary | ICD-10-CM | POA: Diagnosis not present

## 2018-11-26 DIAGNOSIS — I739 Peripheral vascular disease, unspecified: Secondary | ICD-10-CM | POA: Diagnosis not present

## 2018-11-26 DIAGNOSIS — T8143XA Infection following a procedure, organ and space surgical site, initial encounter: Secondary | ICD-10-CM | POA: Diagnosis not present

## 2018-11-26 DIAGNOSIS — Z4682 Encounter for fitting and adjustment of non-vascular catheter: Secondary | ICD-10-CM | POA: Diagnosis not present

## 2018-11-26 DIAGNOSIS — L89322 Pressure ulcer of left buttock, stage 2: Secondary | ICD-10-CM | POA: Diagnosis not present

## 2018-11-26 DIAGNOSIS — I4891 Unspecified atrial fibrillation: Secondary | ICD-10-CM | POA: Diagnosis not present

## 2018-11-26 DIAGNOSIS — J449 Chronic obstructive pulmonary disease, unspecified: Secondary | ICD-10-CM | POA: Diagnosis not present

## 2018-11-27 ENCOUNTER — Other Ambulatory Visit: Payer: Self-pay

## 2018-11-27 ENCOUNTER — Ambulatory Visit (INDEPENDENT_AMBULATORY_CARE_PROVIDER_SITE_OTHER): Payer: Self-pay | Admitting: Thoracic Surgery (Cardiothoracic Vascular Surgery)

## 2018-11-27 ENCOUNTER — Encounter: Payer: Self-pay | Admitting: Thoracic Surgery (Cardiothoracic Vascular Surgery)

## 2018-11-27 ENCOUNTER — Ambulatory Visit
Admission: RE | Admit: 2018-11-27 | Discharge: 2018-11-27 | Disposition: A | Discharge status: Home / Self Care | Payer: No Typology Code available for payment source | Source: Ambulatory Visit | Attending: Thoracic Surgery (Cardiothoracic Vascular Surgery) | Admitting: Thoracic Surgery (Cardiothoracic Vascular Surgery)

## 2018-11-27 VITALS — BP 118/69 | HR 82 | Temp 97.7°F | Resp 16 | Ht 71.5 in | Wt 131.2 lb

## 2018-11-27 DIAGNOSIS — Z9689 Presence of other specified functional implants: Secondary | ICD-10-CM

## 2018-11-27 DIAGNOSIS — J869 Pyothorax without fistula: Secondary | ICD-10-CM

## 2018-11-27 DIAGNOSIS — J90 Pleural effusion, not elsewhere classified: Secondary | ICD-10-CM

## 2018-11-27 NOTE — Progress Notes (Signed)
[address]       [place] [address]             [phone]     HPI: Mr. Mcquigg returns for a scheduled follow-up visit  William Kirk is a 78 year old former smoker with a past history of COPD, bronchiectasis, recurrent pneumonia, recurrent hemoptysis, peripheral arterial disease, reflux, and depression.  He had multiple pneumonias with bronchiectasis in his right lower lobe.  He had 2 episodes of massive hemoptysis earlier this year.  I did a thoracoscopic right lower lobectomy on 10/18/2018.  He had atrial fibrillation postoperatively which converted to sinus rhythm with amiodarone.  He presented back to the office on 11/08/2018 with increasing pain and shortness of breath.  He also had drainage from his incision.  He turned out to have an empyema.  His bronchial stump was intact.  I did a redo right VATS for drainage of an empyema and decortication.  Unfortunately we were not able to get his lung to completely reexpand.  Cultures grew Klebsiella pneumonia and Streptococcus Constellatus.  He was discharged home on 11/22/2018.  In the postoperative period he was noted to be aspirating.  He went home with a feeding tube in place.  We also discharged him on ertapenem for an additional 2 weeks after discharge.  He is feeling well.  He thinks he is getting stronger.  He is able to get up and go to the bathroom on his own using a walker.  He has been working on his swallowing exercises but has not been seen at home by speech therapy yet.  Mrs. Davison is doing his antibiotics.  He has mild pain. Past Medical History:  Diagnosis Date  . Anxiety   . Arthritis    "hands" (09/21/2016)  . Bleeding stomach ulcer 2000s  . COPD (chronic obstructive pulmonary disease) ([place])   . Cough   . Depression   . Dyspnea   . Hypertension   . Pneumonia    "now and several times before this" (09/21/2016)  . PVD (peripheral vascular disease) ([place]) 2014   prev PTCA on RLE     Current Outpatient Medications  Medication Sig Dispense Refill  . albuterol (VENTOLIN HFA) 108 (90 Base) MCG/ACT inhaler Inhale 2 puffs into the lungs every 4 (four) hours as needed for wheezing or shortness of breath.    . Amino Acids-Protein Hydrolys (FEEDING SUPPLEMENT, PRO-STAT SUGAR FREE 64,) LIQD Place 30 mLs into feeding tube 2 (two) times daily. 887 mL 0  . atorvastatin (LIPITOR) 20 MG tablet Place 1 tablet (20 mg total) into feeding tube at bedtime. 30 tablet 1  . budesonide-formoterol (SYMBICORT) 160-4.5 MCG/ACT inhaler Inhale 2 puffs into the lungs 2 (two) times daily.    . diclofenac sodium (VOLTAREN) 1 % GEL Apply 2 g topically 4 (four) times daily as needed (pain).     Marland Kitchen docusate (COLACE) 50 MG/5ML liquid Place 10 mLs (100 mg total) into feeding tube at bedtime. 100 mL 0  . ertapenem (INVANZ) IVPB Inject 1 g into the vein daily for 14 days. Indication:  Empyema Last Day of Therapy:  12/06/2018 Labs - Once weekly:  CBC/D and BMP, Labs - Every other week:  ESR and CRP 14 Units 0  . folic acid (FOLVITE) 1 MG tablet Place 1 tablet (1 mg total) into feeding tube daily. 30 tablet 1  . gabapentin (NEURONTIN) 250 MG/5ML solution Place 6 mLs (300 mg total) into feeding tube at bedtime.  180 mL 0  . levalbuterol (XOPENEX) 0.63 MG/3ML nebulizer solution Take 3 mLs (0.63 mg total) by nebulization every 4 (four) hours as needed for wheezing or shortness of breath. 3 mL 12  . lidocaine (LIDODERM) 5 % Place 1 patch onto the skin daily as needed (pain).     . metoprolol tartrate (LOPRESSOR) 25 mg/10 mL SUSP Place 5 mLs (12.5 mg total) into feeding tube daily. 150 mL 0  . Multiple Vitamin (MULTIVITAMIN) LIQD Place 15 mLs into feeding tube daily. 450 mL 0  . Nutritional Supplements (FEEDING SUPPLEMENT, OSMOLITE 1.5 CAL,) LIQD Place 1,000 mLs into feeding tube continuous. 1000 mL 30  . sertraline (ZOLOFT) 100 MG tablet Place 2 tablets (200 mg total) into feeding tube daily. 30 tablet 0  . Tiotropium  Bromide Monohydrate (SPIRIVA RESPIMAT) 2.5 MCG/ACT AERS Inhale 2 puffs into the lungs daily.    . traZODone (DESYREL) 50 MG tablet Place 1 tablet (50 mg total) into feeding tube at bedtime. 30 tablet 1  . vitamin B-12 1000 MCG tablet Place 1 tablet (1,000 mcg total) into feeding tube daily. 30 tablet 1  . Water For Irrigation, Sterile (FREE WATER) SOLN Place 200 mLs into feeding tube 4 (four) times daily. 24000 mL 0  . oxyCODONE (ROXICODONE) 5 MG/5ML solution Take 5 mLs (5 mg total) by mouth every 12 (twelve) hours as needed for up to 7 days for severe pain. (Patient not taking: Reported on 11/27/2018) 70 mL 0   No current facility-administered medications for this visit.     Physical Exam  Diagnostic Tests: CHEST - 2 VIEW  COMPARISON:  11/21/2018  FINDINGS: No significant change in radiographic examination with a moderate right hydropneumothorax and volume loss of the right hemithorax, including a posterior loculated air component, and right-sided chest tube in position about the right medial posterior pleural space. The left lung is normally aerated. The heart and mediastinum are partially obscured although otherwise unremarkable. Right upper extremity PICC. Partially imaged enteric feeding tube.  IMPRESSION: No significant change in radiographic examination with a moderate right hydropneumothorax and volume loss of the right hemithorax, including a posterior loculated air component, and right-sided chest tube in position about the right medial posterior pleural space.   Electronically Signed   By: Eddie Candle M.D.   On: 11/27/2018 12:52 I personally reviewed the chest x-ray images.  I think there has been a slight decrease in the size of the basilar space.  Impression: William Kirk is a 78 year old former smoker with a past history of COPD, bronchiectasis, recurrent pneumonia, recurrent hemoptysis, peripheral arterial disease, reflux, and depression.  He had 2  episodes of massive hemoptysis earlier this year.  He underwent thoracoscopic right lower lobectomy on 10/18/2018.  He presented back about 3 weeks later with an empyema.  I was concerned there was a bronchopleural fistula but the bronchial stump was well-healed.  I did a right VATS for decortication on 11/09/2018.  He had problems with aspiration postoperatively and went home with a core track feeding tube.  He also went home on IV antibiotics.  Empyema-he will complete 4-week course of intravenous antibiotics with ertapenem in about 10 days.  He is afebrile.  He does have a basilar space.  He has a chest tube in place.  It is draining some murky fluid.  It is to a mini express.  We will leave the tube in place.  I did advance it back 2 cm.  We will continue to gradually advance that out.  Aspiration-continue n.p.o.  Speech therapy was arranged as an outpatient but they have not seen him yet.  We will check on that.  Deconditioning-Home PT was arranged but has not seen him yet.  We will check on that as well.  Postoperative atrial fibrillation-in sinus rhythm on metoprolol.  Plan: Continue ertapenem through 12/06/2018 Continue speech therapy and physical therapy Continue tube feedings until cleared to swallow by speech therapy Return in 1 week with PA and lateral chest x-ray  Melrose Nakayama, MD [facility] [phone]

## 2018-11-28 DIAGNOSIS — Z4682 Encounter for fitting and adjustment of non-vascular catheter: Secondary | ICD-10-CM | POA: Diagnosis not present

## 2018-11-28 DIAGNOSIS — J9611 Chronic respiratory failure with hypoxia: Secondary | ICD-10-CM | POA: Diagnosis not present

## 2018-11-28 DIAGNOSIS — I10 Essential (primary) hypertension: Secondary | ICD-10-CM | POA: Diagnosis not present

## 2018-11-28 DIAGNOSIS — J869 Pyothorax without fistula: Secondary | ICD-10-CM | POA: Diagnosis not present

## 2018-11-28 DIAGNOSIS — L89322 Pressure ulcer of left buttock, stage 2: Secondary | ICD-10-CM | POA: Diagnosis not present

## 2018-11-28 DIAGNOSIS — I739 Peripheral vascular disease, unspecified: Secondary | ICD-10-CM | POA: Diagnosis not present

## 2018-11-28 DIAGNOSIS — T8143XA Infection following a procedure, organ and space surgical site, initial encounter: Secondary | ICD-10-CM | POA: Diagnosis not present

## 2018-11-28 DIAGNOSIS — I4891 Unspecified atrial fibrillation: Secondary | ICD-10-CM | POA: Diagnosis not present

## 2018-11-28 DIAGNOSIS — J449 Chronic obstructive pulmonary disease, unspecified: Secondary | ICD-10-CM | POA: Diagnosis not present

## 2018-11-29 DIAGNOSIS — T8143XA Infection following a procedure, organ and space surgical site, initial encounter: Secondary | ICD-10-CM | POA: Diagnosis not present

## 2018-11-29 DIAGNOSIS — L89322 Pressure ulcer of left buttock, stage 2: Secondary | ICD-10-CM | POA: Diagnosis not present

## 2018-11-29 DIAGNOSIS — I4891 Unspecified atrial fibrillation: Secondary | ICD-10-CM | POA: Diagnosis not present

## 2018-11-29 DIAGNOSIS — J9611 Chronic respiratory failure with hypoxia: Secondary | ICD-10-CM | POA: Diagnosis not present

## 2018-11-29 DIAGNOSIS — J869 Pyothorax without fistula: Secondary | ICD-10-CM | POA: Diagnosis not present

## 2018-11-29 DIAGNOSIS — J449 Chronic obstructive pulmonary disease, unspecified: Secondary | ICD-10-CM | POA: Diagnosis not present

## 2018-11-29 DIAGNOSIS — Z4682 Encounter for fitting and adjustment of non-vascular catheter: Secondary | ICD-10-CM | POA: Diagnosis not present

## 2018-11-29 DIAGNOSIS — I739 Peripheral vascular disease, unspecified: Secondary | ICD-10-CM | POA: Diagnosis not present

## 2018-11-29 DIAGNOSIS — I10 Essential (primary) hypertension: Secondary | ICD-10-CM | POA: Diagnosis not present

## 2018-12-02 LAB — ACID FAST CULTURE WITH REFLEXED SENSITIVITIES (MYCOBACTERIA): Acid Fast Culture: NEGATIVE

## 2018-12-03 ENCOUNTER — Other Ambulatory Visit: Payer: Self-pay | Admitting: Thoracic Surgery (Cardiothoracic Vascular Surgery)

## 2018-12-03 DIAGNOSIS — T8143XA Infection following a procedure, organ and space surgical site, initial encounter: Secondary | ICD-10-CM | POA: Diagnosis not present

## 2018-12-03 DIAGNOSIS — J9611 Chronic respiratory failure with hypoxia: Secondary | ICD-10-CM | POA: Diagnosis not present

## 2018-12-03 DIAGNOSIS — I10 Essential (primary) hypertension: Secondary | ICD-10-CM | POA: Diagnosis not present

## 2018-12-03 DIAGNOSIS — I4891 Unspecified atrial fibrillation: Secondary | ICD-10-CM | POA: Diagnosis not present

## 2018-12-03 DIAGNOSIS — J869 Pyothorax without fistula: Secondary | ICD-10-CM | POA: Diagnosis not present

## 2018-12-03 DIAGNOSIS — Z4682 Encounter for fitting and adjustment of non-vascular catheter: Secondary | ICD-10-CM | POA: Diagnosis not present

## 2018-12-03 DIAGNOSIS — J449 Chronic obstructive pulmonary disease, unspecified: Secondary | ICD-10-CM | POA: Diagnosis not present

## 2018-12-03 DIAGNOSIS — L89322 Pressure ulcer of left buttock, stage 2: Secondary | ICD-10-CM | POA: Diagnosis not present

## 2018-12-03 DIAGNOSIS — I739 Peripheral vascular disease, unspecified: Secondary | ICD-10-CM | POA: Diagnosis not present

## 2018-12-04 ENCOUNTER — Other Ambulatory Visit: Payer: Self-pay

## 2018-12-04 ENCOUNTER — Ambulatory Visit: Payer: No Typology Code available for payment source | Admitting: Thoracic Surgery (Cardiothoracic Vascular Surgery)

## 2018-12-04 ENCOUNTER — Ambulatory Visit (INDEPENDENT_AMBULATORY_CARE_PROVIDER_SITE_OTHER): Payer: Self-pay | Admitting: Thoracic Surgery (Cardiothoracic Vascular Surgery)

## 2018-12-04 ENCOUNTER — Encounter: Payer: Self-pay | Admitting: Thoracic Surgery (Cardiothoracic Vascular Surgery)

## 2018-12-04 ENCOUNTER — Ambulatory Visit
Admission: RE | Admit: 2018-12-04 | Discharge: 2018-12-04 | Disposition: A | Discharge status: Home / Self Care | Payer: No Typology Code available for payment source | Source: Ambulatory Visit | Attending: Thoracic Surgery (Cardiothoracic Vascular Surgery) | Admitting: Thoracic Surgery (Cardiothoracic Vascular Surgery)

## 2018-12-04 VITALS — BP 109/99 | HR 77 | Temp 97.7°F | Resp 16 | Ht 71.0 in | Wt 130.0 lb

## 2018-12-04 DIAGNOSIS — R918 Other nonspecific abnormal finding of lung field: Secondary | ICD-10-CM | POA: Diagnosis not present

## 2018-12-04 DIAGNOSIS — Z9689 Presence of other specified functional implants: Secondary | ICD-10-CM

## 2018-12-04 DIAGNOSIS — J869 Pyothorax without fistula: Secondary | ICD-10-CM

## 2018-12-04 DIAGNOSIS — Z902 Acquired absence of lung [part of]: Secondary | ICD-10-CM

## 2018-12-04 DIAGNOSIS — J90 Pleural effusion, not elsewhere classified: Secondary | ICD-10-CM

## 2018-12-04 NOTE — Progress Notes (Signed)
[address]       ,[place] [address]             [phone]     HPI: William Kirk returns for a scheduled follow-up  William Kirk is a 78 year old man with a past history of tobacco abuse, COPD, bronchiectasis, recurrent pneumonia, recurrent hemoptysis, peripheral arterial disease, reflux, and depression.  He had a thoracoscopic right lower lobectomy on 10/18/2018.  He presented back on 11/08/2018 with an empyema.  Did a redo VATS to drain the empyema decorticate the lung.  His lung would not come lately reexpand.  Cultures grew Klebsiella and Streptococcus constellatus.  He was discharged on 11/22/2018.  I saw him back in the office a week ago.  His tube had minimal drainage.  We advanced the tube out 2 cm.  He is still n.p.o. with a feeding tube in place due to aspiration. Past Medical History:  Diagnosis Date  . Anxiety   . Arthritis    "hands" (09/21/2016)  . Bleeding stomach ulcer 2000s  . COPD (chronic obstructive pulmonary disease) ([place])   . Cough   . Depression   . Dyspnea   . Hypertension   . Pneumonia    "now and several times before this" (09/21/2016)  . PVD (peripheral vascular disease) ([place]) 2014   prev PTCA on RLE    Current Outpatient Medications  Medication Sig Dispense Refill  . albuterol (VENTOLIN HFA) 108 (90 Base) MCG/ACT inhaler Inhale 2 puffs into the lungs every 4 (four) hours as needed for wheezing or shortness of breath.    . Amino Acids-Protein Hydrolys (FEEDING SUPPLEMENT, PRO-STAT SUGAR FREE 64,) LIQD Place 30 mLs into feeding tube 2 (two) times daily. 887 mL 0  . atorvastatin (LIPITOR) 20 MG tablet Place 1 tablet (20 mg total) into feeding tube at bedtime. 30 tablet 1  . budesonide-formoterol (SYMBICORT) 160-4.5 MCG/ACT inhaler Inhale 2 puffs into the lungs 2 (two) times daily.    . diclofenac sodium (VOLTAREN) 1 % GEL Apply 2 g topically 4 (four) times daily as needed (pain).     Marland Kitchen docusate (COLACE) 50 MG/5ML liquid Place 10  mLs (100 mg total) into feeding tube at bedtime. 100 mL 0  . ertapenem (INVANZ) IVPB Inject 1 g into the vein daily for 14 days. Indication:  Empyema Last Day of Therapy:  12/06/2018 Labs - Once weekly:  CBC/D and BMP, Labs - Every other week:  ESR and CRP 14 Units 0  . folic acid (FOLVITE) 1 MG tablet Place 1 tablet (1 mg total) into feeding tube daily. 30 tablet 1  . gabapentin (NEURONTIN) 250 MG/5ML solution Place 6 mLs (300 mg total) into feeding tube at bedtime. 180 mL 0  . levalbuterol (XOPENEX) 0.63 MG/3ML nebulizer solution Take 3 mLs (0.63 mg total) by nebulization every 4 (four) hours as needed for wheezing or shortness of breath. 3 mL 12  . lidocaine (LIDODERM) 5 % Place 1 patch onto the skin daily as needed (pain).     . metoprolol tartrate (LOPRESSOR) 25 mg/10 mL SUSP Place 5 mLs (12.5 mg total) into feeding tube daily. 150 mL 0  . Multiple Vitamin (MULTIVITAMIN) LIQD Place 15 mLs into feeding tube daily. 450 mL 0  . Nutritional Supplements (FEEDING SUPPLEMENT, OSMOLITE 1.5 CAL,) LIQD Place 1,000 mLs into feeding tube continuous. 1000 mL 30  . sertraline (ZOLOFT) 100 MG tablet Place 2 tablets (200 mg total) into feeding tube daily. 30 tablet 0  .  Tiotropium Bromide Monohydrate (SPIRIVA RESPIMAT) 2.5 MCG/ACT AERS Inhale 2 puffs into the lungs daily.    . traZODone (DESYREL) 50 MG tablet Place 1 tablet (50 mg total) into feeding tube at bedtime. 30 tablet 1  . vitamin B-12 1000 MCG tablet Place 1 tablet (1,000 mcg total) into feeding tube daily. 30 tablet 1  . Water For Irrigation, Sterile (FREE WATER) SOLN Place 200 mLs into feeding tube 4 (four) times daily. 24000 mL 0   No current facility-administered medications for this visit.     Physical Exam BP (!) 109/99 (BP Location: Right Arm)   Pulse 77   Temp 97.7 F (36.5 C) (Skin)   Resp 16   Ht 5' 11"  (1.803 m)   Wt 130 lb (59 kg)   SpO2 95% Comment: RA  BMI 18.3 kg/m  78 year old man in no acute distress Feeding tube in  place Cardiac regular rate and rhythm normal S1 and S2 Lungs diminished at right base Tube site with some local irritation and erythema  Diagnostic Tests: CHEST - 2 VIEW  COMPARISON:  Multiple prior most recent plain film 11/27/2018  FINDINGS: Cardiomediastinal silhouette unchanged in size and contour with partial obscuration of the right heart border. Persistent opacity at the right lung base obscuring the right hemidiaphragm and the right heart border.  Unchanged thoracostomy tube. Pleuroparenchymal thickening with no visualized apical pneumothorax. Similar appearance of focal lucency on the lateral view, potentially persistent component of hydropneumothorax.  Right upper extremity PICC is unchanged.  Enteric tube unchanged.  Surgical changes in the right hilar region and at the right lung base.  Left lung well aerated. Similar appearance of pleuroparenchymal thickening/scarring at the left lung apex.  IMPRESSION: Postoperative changes again demonstrated of the right hemithorax with unchanged thoracostomy tube, persistent right basilar opacity, chronic lung changes, and persisting pleuroparenchymal thickening.  Unchanged right upper extremity PICC and enteric tube.   Electronically Signed   By: Corrie Mckusick D.O.   On: 12/04/2018 11:36 I personally reviewed the chest x-ray and concur with the findings noted above  Impression: William Kirk is a 78 year old former smoker who had a thoracoscopic right lower lobectomy in September for recurrent pneumonias, chronic bronchiectasis, and recurrent massive hemoptysis.  He did well initially but presented back about 3 weeks postoperatively with an empyema.  That is not surprising given his presentation.  He underwent a redo VATS for drainage of the empyema and decortication.  We could not get his lung to completely reexpand so he was left with the tube in place and discharged home with that.  His chest x-ray is  stable to slightly improved.  I advanced his tube out about an inch.  The grooves on the [place] drain are now outside the chest so the tube was cut off and secured with a safety pin to the previous suture and an ostomy bag was placed over the tube.   Plan: Complete course of antibiotics Continue PT and speech therapy Return in 1 week  Melrose Nakayama, MD [facility] [phone]

## 2018-12-05 DIAGNOSIS — J449 Chronic obstructive pulmonary disease, unspecified: Secondary | ICD-10-CM | POA: Diagnosis not present

## 2018-12-05 DIAGNOSIS — T8143XA Infection following a procedure, organ and space surgical site, initial encounter: Secondary | ICD-10-CM | POA: Diagnosis not present

## 2018-12-05 DIAGNOSIS — I4891 Unspecified atrial fibrillation: Secondary | ICD-10-CM | POA: Diagnosis not present

## 2018-12-05 DIAGNOSIS — I10 Essential (primary) hypertension: Secondary | ICD-10-CM | POA: Diagnosis not present

## 2018-12-05 DIAGNOSIS — Z4682 Encounter for fitting and adjustment of non-vascular catheter: Secondary | ICD-10-CM | POA: Diagnosis not present

## 2018-12-05 DIAGNOSIS — J9611 Chronic respiratory failure with hypoxia: Secondary | ICD-10-CM | POA: Diagnosis not present

## 2018-12-05 DIAGNOSIS — J869 Pyothorax without fistula: Secondary | ICD-10-CM | POA: Diagnosis not present

## 2018-12-05 DIAGNOSIS — I739 Peripheral vascular disease, unspecified: Secondary | ICD-10-CM | POA: Diagnosis not present

## 2018-12-05 DIAGNOSIS — L89322 Pressure ulcer of left buttock, stage 2: Secondary | ICD-10-CM | POA: Diagnosis not present

## 2018-12-06 DIAGNOSIS — T8143XA Infection following a procedure, organ and space surgical site, initial encounter: Secondary | ICD-10-CM | POA: Diagnosis not present

## 2018-12-06 DIAGNOSIS — J9611 Chronic respiratory failure with hypoxia: Secondary | ICD-10-CM | POA: Diagnosis not present

## 2018-12-06 DIAGNOSIS — J869 Pyothorax without fistula: Secondary | ICD-10-CM | POA: Diagnosis not present

## 2018-12-06 DIAGNOSIS — J449 Chronic obstructive pulmonary disease, unspecified: Secondary | ICD-10-CM | POA: Diagnosis not present

## 2018-12-06 DIAGNOSIS — L89322 Pressure ulcer of left buttock, stage 2: Secondary | ICD-10-CM | POA: Diagnosis not present

## 2018-12-06 DIAGNOSIS — I10 Essential (primary) hypertension: Secondary | ICD-10-CM | POA: Diagnosis not present

## 2018-12-06 DIAGNOSIS — I739 Peripheral vascular disease, unspecified: Secondary | ICD-10-CM | POA: Diagnosis not present

## 2018-12-06 DIAGNOSIS — I4891 Unspecified atrial fibrillation: Secondary | ICD-10-CM | POA: Diagnosis not present

## 2018-12-06 DIAGNOSIS — Z4682 Encounter for fitting and adjustment of non-vascular catheter: Secondary | ICD-10-CM | POA: Diagnosis not present

## 2018-12-07 DIAGNOSIS — I4891 Unspecified atrial fibrillation: Secondary | ICD-10-CM | POA: Diagnosis not present

## 2018-12-07 DIAGNOSIS — Z4682 Encounter for fitting and adjustment of non-vascular catheter: Secondary | ICD-10-CM | POA: Diagnosis not present

## 2018-12-07 DIAGNOSIS — I10 Essential (primary) hypertension: Secondary | ICD-10-CM | POA: Diagnosis not present

## 2018-12-07 DIAGNOSIS — J449 Chronic obstructive pulmonary disease, unspecified: Secondary | ICD-10-CM | POA: Diagnosis not present

## 2018-12-07 DIAGNOSIS — T8143XA Infection following a procedure, organ and space surgical site, initial encounter: Secondary | ICD-10-CM | POA: Diagnosis not present

## 2018-12-07 DIAGNOSIS — L89322 Pressure ulcer of left buttock, stage 2: Secondary | ICD-10-CM | POA: Diagnosis not present

## 2018-12-07 DIAGNOSIS — J9611 Chronic respiratory failure with hypoxia: Secondary | ICD-10-CM | POA: Diagnosis not present

## 2018-12-07 DIAGNOSIS — I739 Peripheral vascular disease, unspecified: Secondary | ICD-10-CM | POA: Diagnosis not present

## 2018-12-07 DIAGNOSIS — J869 Pyothorax without fistula: Secondary | ICD-10-CM | POA: Diagnosis not present

## 2018-12-10 ENCOUNTER — Other Ambulatory Visit: Payer: Self-pay | Admitting: Thoracic Surgery (Cardiothoracic Vascular Surgery)

## 2018-12-10 DIAGNOSIS — I4891 Unspecified atrial fibrillation: Secondary | ICD-10-CM | POA: Diagnosis not present

## 2018-12-10 DIAGNOSIS — I739 Peripheral vascular disease, unspecified: Secondary | ICD-10-CM | POA: Diagnosis not present

## 2018-12-10 DIAGNOSIS — Z4682 Encounter for fitting and adjustment of non-vascular catheter: Secondary | ICD-10-CM | POA: Diagnosis not present

## 2018-12-10 DIAGNOSIS — J869 Pyothorax without fistula: Secondary | ICD-10-CM | POA: Diagnosis not present

## 2018-12-10 DIAGNOSIS — L89322 Pressure ulcer of left buttock, stage 2: Secondary | ICD-10-CM | POA: Diagnosis not present

## 2018-12-10 DIAGNOSIS — J9611 Chronic respiratory failure with hypoxia: Secondary | ICD-10-CM | POA: Diagnosis not present

## 2018-12-10 DIAGNOSIS — T8143XA Infection following a procedure, organ and space surgical site, initial encounter: Secondary | ICD-10-CM | POA: Diagnosis not present

## 2018-12-10 DIAGNOSIS — J449 Chronic obstructive pulmonary disease, unspecified: Secondary | ICD-10-CM | POA: Diagnosis not present

## 2018-12-10 DIAGNOSIS — I10 Essential (primary) hypertension: Secondary | ICD-10-CM | POA: Diagnosis not present

## 2018-12-11 ENCOUNTER — Encounter: Payer: Self-pay | Admitting: Thoracic Surgery (Cardiothoracic Vascular Surgery)

## 2018-12-11 ENCOUNTER — Other Ambulatory Visit: Payer: Self-pay | Admitting: *Deleted

## 2018-12-11 ENCOUNTER — Other Ambulatory Visit (HOSPITAL_COMMUNITY): Payer: Self-pay | Admitting: Thoracic Surgery (Cardiothoracic Vascular Surgery)

## 2018-12-11 ENCOUNTER — Emergency Department (HOSPITAL_COMMUNITY)
Admission: EM | Admit: 2018-12-11 | Discharge: 2018-12-12 | Disposition: A | Discharge status: Home / Self Care | Payer: No Typology Code available for payment source | Attending: Emergency Medicine | Admitting: Emergency Medicine

## 2018-12-11 ENCOUNTER — Encounter (HOSPITAL_COMMUNITY): Payer: Self-pay | Admitting: Emergency Medicine

## 2018-12-11 ENCOUNTER — Ambulatory Visit
Admission: RE | Admit: 2018-12-11 | Discharge: 2018-12-11 | Disposition: A | Discharge status: Home / Self Care | Payer: Medicare HMO | Source: Ambulatory Visit | Attending: Thoracic Surgery (Cardiothoracic Vascular Surgery) | Admitting: Thoracic Surgery (Cardiothoracic Vascular Surgery)

## 2018-12-11 ENCOUNTER — Other Ambulatory Visit: Payer: Self-pay

## 2018-12-11 ENCOUNTER — Ambulatory Visit (INDEPENDENT_AMBULATORY_CARE_PROVIDER_SITE_OTHER): Payer: Self-pay | Admitting: Thoracic Surgery (Cardiothoracic Vascular Surgery)

## 2018-12-11 VITALS — BP 120/65 | HR 79 | Temp 98.1°F | Resp 20 | Ht 71.0 in | Wt 130.0 lb

## 2018-12-11 DIAGNOSIS — L89322 Pressure ulcer of left buttock, stage 2: Secondary | ICD-10-CM | POA: Diagnosis not present

## 2018-12-11 DIAGNOSIS — I739 Peripheral vascular disease, unspecified: Secondary | ICD-10-CM | POA: Diagnosis not present

## 2018-12-11 DIAGNOSIS — J948 Other specified pleural conditions: Secondary | ICD-10-CM

## 2018-12-11 DIAGNOSIS — I10 Essential (primary) hypertension: Secondary | ICD-10-CM | POA: Insufficient documentation

## 2018-12-11 DIAGNOSIS — J869 Pyothorax without fistula: Secondary | ICD-10-CM

## 2018-12-11 DIAGNOSIS — T85598A Other mechanical complication of other gastrointestinal prosthetic devices, implants and grafts, initial encounter: Secondary | ICD-10-CM

## 2018-12-11 DIAGNOSIS — J449 Chronic obstructive pulmonary disease, unspecified: Secondary | ICD-10-CM | POA: Insufficient documentation

## 2018-12-11 DIAGNOSIS — R1310 Dysphagia, unspecified: Secondary | ICD-10-CM

## 2018-12-11 DIAGNOSIS — Z87891 Personal history of nicotine dependence: Secondary | ICD-10-CM | POA: Diagnosis not present

## 2018-12-11 DIAGNOSIS — Z79899 Other long term (current) drug therapy: Secondary | ICD-10-CM | POA: Insufficient documentation

## 2018-12-11 DIAGNOSIS — R633 Feeding difficulties, unspecified: Secondary | ICD-10-CM

## 2018-12-11 DIAGNOSIS — Z20828 Contact with and (suspected) exposure to other viral communicable diseases: Secondary | ICD-10-CM | POA: Diagnosis not present

## 2018-12-11 DIAGNOSIS — Z4659 Encounter for fitting and adjustment of other gastrointestinal appliance and device: Secondary | ICD-10-CM

## 2018-12-11 DIAGNOSIS — Z4682 Encounter for fitting and adjustment of non-vascular catheter: Secondary | ICD-10-CM | POA: Diagnosis not present

## 2018-12-11 DIAGNOSIS — J9611 Chronic respiratory failure with hypoxia: Secondary | ICD-10-CM | POA: Diagnosis not present

## 2018-12-11 DIAGNOSIS — I4891 Unspecified atrial fibrillation: Secondary | ICD-10-CM | POA: Diagnosis not present

## 2018-12-11 DIAGNOSIS — K9423 Gastrostomy malfunction: Secondary | ICD-10-CM | POA: Diagnosis not present

## 2018-12-11 DIAGNOSIS — T8143XA Infection following a procedure, organ and space surgical site, initial encounter: Secondary | ICD-10-CM | POA: Diagnosis not present

## 2018-12-11 DIAGNOSIS — Z03818 Encounter for observation for suspected exposure to other biological agents ruled out: Secondary | ICD-10-CM | POA: Diagnosis not present

## 2018-12-11 NOTE — ED Notes (Signed)
Unsuccessful attempt at [place] feeding tube.

## 2018-12-11 NOTE — Progress Notes (Signed)
[address]       [place] [address]             [phone]     HPI: William Kirk returns for scheduled follow-up visit  William Kirk is a 78 year old man with a past history of tobacco abuse, COPD, bronchiectasis, recurrent pneumonia, recurrent hemoptysis, peripheral arterial disease, reflux, and depression.  He had a thoracoscopic right lower lobectomy on 10/18/2018.  He presented back on 11/08/2018 with an empyema.  Did a redo VATS to drain the empyema decorticate the lung.  His lung would not come lately reexpand.  Cultures grew Klebsiella and Streptococcus constellatus.  He was discharged on 11/22/2018.  I saw him in the office a week ago.  He was making some progress.  We advanced the tube out a couple of centimeters.  His wife notes that the tube seem to be out further today than it had been previously.  The fluid has been draining is a caramel color and thick.  She has been emptying the ostomy bag every day.  He had a repeat swallow evaluation yesterday and is still completely n.p.o.  Past Medical History:  Diagnosis Date  . Anxiety   . Arthritis    "hands" (09/21/2016)  . Bleeding stomach ulcer 2000s  . COPD (chronic obstructive pulmonary disease) ([place])   . Cough   . Depression   . Dyspnea   . Hypertension   . Pneumonia    "now and several times before this" (09/21/2016)  . PVD (peripheral vascular disease) ([place]) 2014   prev PTCA on RLE    Current Outpatient Medications  Medication Sig Dispense Refill  . albuterol (VENTOLIN HFA) 108 (90 Base) MCG/ACT inhaler Inhale 2 puffs into the lungs every 4 (four) hours as needed for wheezing or shortness of breath.    . Amino Acids-Protein Hydrolys (FEEDING SUPPLEMENT, PRO-STAT SUGAR FREE 64,) LIQD Place 30 mLs into feeding tube 2 (two) times daily. 887 mL 0  . atorvastatin (LIPITOR) 20 MG tablet Place 1 tablet (20 mg total) into feeding tube at bedtime. 30 tablet 1  . budesonide-formoterol (SYMBICORT)  160-4.5 MCG/ACT inhaler Inhale 2 puffs into the lungs 2 (two) times daily.    . diclofenac sodium (VOLTAREN) 1 % GEL Apply 2 g topically 4 (four) times daily as needed (pain).     Marland Kitchen docusate (COLACE) 50 MG/5ML liquid Place 10 mLs (100 mg total) into feeding tube at bedtime. 123XX123 mL 0  . folic acid (FOLVITE) 1 MG tablet Place 1 tablet (1 mg total) into feeding tube daily. 30 tablet 1  . gabapentin (NEURONTIN) 250 MG/5ML solution Place 6 mLs (300 mg total) into feeding tube at bedtime. 180 mL 0  . levalbuterol (XOPENEX) 0.63 MG/3ML nebulizer solution Take 3 mLs (0.63 mg total) by nebulization every 4 (four) hours as needed for wheezing or shortness of breath. 3 mL 12  . lidocaine (LIDODERM) 5 % Place 1 patch onto the skin daily as needed (pain).     . metoprolol tartrate (LOPRESSOR) 25 mg/10 mL SUSP Place 5 mLs (12.5 mg total) into feeding tube daily. 150 mL 0  . Multiple Vitamin (MULTIVITAMIN) LIQD Place 15 mLs into feeding tube daily. 450 mL 0  . Nutritional Supplements (FEEDING SUPPLEMENT, OSMOLITE 1.5 CAL,) LIQD Place 1,000 mLs into feeding tube continuous. 1000 mL 30  . sertraline (ZOLOFT) 100 MG tablet Place 2 tablets (200 mg total) into feeding tube daily. 30 tablet 0  . Tiotropium Bromide  Monohydrate (SPIRIVA RESPIMAT) 2.5 MCG/ACT AERS Inhale 2 puffs into the lungs daily.    . traZODone (DESYREL) 50 MG tablet Place 1 tablet (50 mg total) into feeding tube at bedtime. 30 tablet 1  . vitamin B-12 1000 MCG tablet Place 1 tablet (1,000 mcg total) into feeding tube daily. 30 tablet 1  . Water For Irrigation, Sterile (FREE WATER) SOLN Place 200 mLs into feeding tube 4 (four) times daily. 24000 mL 0   No current facility-administered medications for this visit.     Physical Exam BP 120/65   Pulse 79   Temp 98.1 F (36.7 C) (Skin)   Resp 20   Ht 5\' 11"  (1.803 m)   Wt 130 lb (59 kg)   SpO2 92% Comment: RA  BMI 18.52 kg/m  78 year old man in no acute distress Alert and oriented x3 with no  focal deficits Lungs diminished at right base Suture has pulled through skin chest drain only minimally in chest.  Diagnostic Tests: CHEST - 2 VIEW  COMPARISON:  Radiograph 12/04/2018, CT 11/06/2018  FINDINGS: Stable RIGHT hydropneumothorax with chest tube in place. Feeding tube and PICC line unchanged. LEFT lung is expanded. No pulmonary edema or pneumothorax.  IMPRESSION: 1. No change in RIGHT hydropneumothorax with chest tube in place.   Electronically Signed   By: Suzy Bouchard M.D.   On: 12/11/2018 12:51 I personally reviewed the chest x-ray and concur with the findings noted above  Impression: William Kirk is a 78 year old former smoker who had a thoracoscopic right lower lobectomy in September 2020 for recurrent pneumonia, chronic bronchiectasis, and recurrent massive hemoptysis.  He presented back 3 weeks later with an empyema.  We did a redo VATS for decortication.  His lung would not completely reexpand.  He was sent home with a chest drain in place.  Over the past week the suture has eroded through the skin and the tube was being held in by the ostomy bag.  I was able to easily readvanced the tube part way into the chest.  A new suture was placed and the drain was pinned to the suture.  A new ostomy bag was placed over the site.  He is no longer receiving any medications through his PICC line.  I remove that in the office today.  His wife reports that he failed a swallow eval.  He has not been given any idea of how long he will need to remain n.p.o.  I think at this point it would be best to go ahead and have a PEG tube placed so that we can get the tube out of his nose.  We will see if we can arrange that with interventional radiology.  Plan:  Referral to interventional radiology for PEG placement Remove core track tube once PEG in place Continue local wound care Return in 2 weeks with PA and lateral chest x-ray  Melrose Nakayama, MD [facility] [phone]

## 2018-12-11 NOTE — ED Triage Notes (Signed)
Pt has NG feeding tube in place. States his wife tried to start his feeding today and it would not flush. [facility] RN also tried and could not get it to work. Denies abd pain or any other symptoms.

## 2018-12-12 ENCOUNTER — Emergency Department (HOSPITAL_COMMUNITY): Payer: No Typology Code available for payment source

## 2018-12-12 ENCOUNTER — Other Ambulatory Visit (HOSPITAL_COMMUNITY): Admission: RE | Admit: 2018-12-12 | Payer: No Typology Code available for payment source | Source: Ambulatory Visit

## 2018-12-12 ENCOUNTER — Other Ambulatory Visit: Payer: Self-pay | Admitting: Radiology

## 2018-12-12 LAB — SARS CORONAVIRUS 2 BY RT PCR (HOSPITAL ORDER, PERFORMED IN ~~LOC~~ HOSPITAL LAB): SARS Coronavirus 2: NEGATIVE

## 2018-12-12 LAB — CBG MONITORING, ED: Glucose-Capillary: 92 mg/dL (ref 70–99)

## 2018-12-12 MED ORDER — LIDOCAINE VISCOUS HCL 2 % MT SOLN
6.0000 mL | Freq: Once | OROMUCOSAL | Status: AC
  Administered 2018-12-12: 6 mL via OROMUCOSAL

## 2018-12-12 MED ORDER — IOHEXOL 300 MG/ML  SOLN
15.0000 mL | Freq: Once | INTRAMUSCULAR | Status: AC | PRN
  Administered 2018-12-12: 15:00:00 15 mL

## 2018-12-12 NOTE — Discharge Instructions (Signed)
Please follow-up with your surgical team on Friday for further management with the PEG tube.  Please continue to flush the tube.  If any symptoms change or worsen, please return to nearest emergency department.

## 2018-12-12 NOTE — ED Provider Notes (Signed)
[facility] EMERGENCY DEPARTMENT Provider Note   CSN: [id number] Arrival date & time: 12/11/18  1720     History   Chief Complaint Chief Complaint  Patient presents with  . Post-op Problem    HPI William Kirk is a 78 y.o. male.     The history is provided by the patient and medical records. No language interpreter was used.  Illness Location:  Feeding tube problem Quality:  NG tube clogged Severity:  Severe Onset quality:  Sudden Duration:  1 day Timing:  Constant Progression:  Unchanged Chronicity:  New Associated symptoms: no abdominal pain, no chest pain, no congestion, no cough, no diarrhea, no fatigue, no fever, no headaches, no nausea, no rash, no rhinorrhea, no shortness of breath, no vomiting and no wheezing     Past Medical History:  Diagnosis Date  . Anxiety   . Arthritis    "hands" (09/21/2016)  . Bleeding stomach ulcer 2000s  . COPD (chronic obstructive pulmonary disease) ([place])   . Cough   . Depression   . Dyspnea   . Hypertension   . Pneumonia    "now and several times before this" (09/21/2016)  . PVD (peripheral vascular disease) ([place]) 2014   prev PTCA on RLE    Patient Active Problem List   Diagnosis Date Noted  . Empyema, right ([place]) 11/09/2018  . Empyema ([place]) 11/06/2018  . Pressure injury of skin 11/06/2018  . 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 Hemoptysis 07/07/2018  . Acute renal failure (ARF) ([place]) 07/07/2018  . Acute respiratory failure ([place]) 07/07/2018  . Chronic obstructive pulmonary disease ([place]) 11/07/2016  . Chronic respiratory failure with hypoxia ([place]) 11/07/2016  . Pulmonary infiltrates 11/07/2016  . Swallowing dysfunction 09/26/2016  . Depression 09/21/2016  . Aspiration pneumonia ([place]) 09/21/2016  . HTN (hypertension) 09/21/2016  . HCAP (healthcare-associated pneumonia)   . COPD with acute exacerbation ([place]) 06/21/2016  . Acute respiratory failure with hypoxia ([place]) 06/21/2016  .  Hypokalemia 06/21/2016  . Hyponatremia 06/21/2016  . Thrombocytosis ([place]) 06/21/2016  . PVD (peripheral vascular disease) ([place]) 06/21/2016  . Bronchiectasis ([place]) 06/21/2016  . Protein-calorie malnutrition, severe ([place]) 06/21/2016  . Multifocal pneumonia 06/20/2016  . FUO (fever of unknown origin) 04/15/2015  . 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 Other tear of medial meniscus, current injury, unspecified knee, initial encounter 12/03/2012    Past Surgical History:  Procedure Laterality Date  . IR ANGIOGRAM SELECTIVE EACH ADDITIONAL VESSEL  07/26/2018  . IR ANGIOGRAM SELECTIVE EACH ADDITIONAL VESSEL  07/26/2018  . IR ANGIOGRAM SELECTIVE EACH ADDITIONAL VESSEL  07/26/2018  . IR EMBO ART  VEN HEMORR LYMPH EXTRAV  INC GUIDE ROADMAPPING  07/26/2018  . IR US GUIDE VASC ACCESS RIGHT  07/26/2018  . JOINT REPLACEMENT    . KNEE ARTHROSCOPY Right X 1  . TOTAL HIP ARTHROPLASTY Right 2009  . VIDEO ASSISTED THORACOSCOPY (VATS)/ LOBECTOMY Right 10/18/2018   Procedure: VIDEO ASSISTED THORACOSCOPY (VATS)/ right lower LOBECTOMY;  Surgeon: Melrose Nakayama, MD;  Location: [facility];  Service: Thoracic;  Laterality: Right;  Marland Kitchen VIDEO ASSISTED THORACOSCOPY (VATS)/DECORTICATION Right 11/09/2018   Procedure: REDO VIDEO ASSISTED THORACOSCOPY (VATS)/DECORTICATION/DRAIN EFFUSION;  Surgeon: Melrose Nakayama, MD;  Location: [facility];  Service: Thoracic;  Laterality: Right;  Marland Kitchen VIDEO BRONCHOSCOPY N/A 07/27/2018   Procedure: VIDEO BRONCHOSCOPY WITHOUT FLUORO;  Surgeon: Collene Gobble, MD;  Location: [facility];  Service: Cardiopulmonary;  Laterality:  N/A;  . VIDEO BRONCHOSCOPY N/A 11/09/2018   Procedure: VIDEO BRONCHOSCOPY;  Surgeon: Melrose Nakayama, MD;  Location: [facility] OR;  Service: Thoracic;  Laterality: N/A;        Home Medications    Prior to Admission medications   Medication Sig Start Date End Date Taking?  Authorizing Provider  albuterol (VENTOLIN HFA) 108 (90 Base) MCG/ACT inhaler Inhale 2 puffs into the lungs every 4 (four) hours as needed for wheezing or shortness of breath. 07/11/18   Hongalgi, Lenis Dickinson, MD  Amino Acids-Protein Hydrolys (FEEDING SUPPLEMENT, PRO-STAT SUGAR FREE 64,) LIQD Place 30 mLs into feeding tube 2 (two) times daily. 11/22/18   [name], PA-C  atorvastatin (LIPITOR) 20 MG tablet Place 1 tablet (20 mg total) into feeding tube at bedtime. 11/22/18   [name], PA-C  budesonide-formoterol (SYMBICORT) 160-4.5 MCG/ACT inhaler Inhale 2 puffs into the lungs 2 (two) times daily.    [provider]  diclofenac sodium (VOLTAREN) 1 % GEL Apply 2 g topically 4 (four) times daily as needed (pain).     [provider]  docusate (COLACE) 50 MG/5ML liquid Place 10 mLs (100 mg total) into feeding tube at bedtime. 11/22/18   [name], PA-C  folic acid (FOLVITE) 1 MG tablet Place 1 tablet (1 mg total) into feeding tube daily. 11/23/18   [name], PA-C  gabapentin (NEURONTIN) 250 MG/5ML solution Place 6 mLs (300 mg total) into feeding tube at bedtime. 11/22/18 12/22/18  [name], PA-C  levalbuterol (XOPENEX) 0.63 MG/3ML nebulizer solution Take 3 mLs (0.63 mg total) by nebulization every 4 (four) hours as needed for wheezing or shortness of breath. 11/22/18   [name], PA-C  lidocaine (LIDODERM) 5 % Place 1 patch onto the skin daily as needed (pain).     [provider]  metoprolol tartrate (LOPRESSOR) 25 mg/10 mL SUSP Place 5 mLs (12.5 mg total) into feeding tube daily. 11/23/18 12/23/18  [name], PA-C  Multiple Vitamin (MULTIVITAMIN) LIQD Place 15 mLs into feeding tube daily. 11/23/18 12/23/18  [name], PA-C  Nutritional Supplements (FEEDING SUPPLEMENT, OSMOLITE 1.5 CAL,) LIQD Place 1,000 mLs into feeding tube continuous. 11/22/18 12/22/18  [name], PA-C  sertraline (ZOLOFT) 100 MG tablet Place 2 tablets (200 mg total)  into feeding tube daily. 11/23/18   [name], PA-C  Tiotropium Bromide Monohydrate (SPIRIVA RESPIMAT) 2.5 MCG/ACT AERS Inhale 2 puffs into the lungs daily.    [provider]  traZODone (DESYREL) 50 MG tablet Place 1 tablet (50 mg total) into feeding tube at bedtime. 11/22/18   [name], PA-C  vitamin B-12 1000 MCG tablet Place 1 tablet (1,000 mcg total) into feeding tube daily. 11/23/18   [name], PA-C  Water For Irrigation, Sterile (FREE WATER) SOLN Place 200 mLs into feeding tube 4 (four) times daily. 11/22/18 12/22/18  [name], PA-C    Family History Family History  Problem Relation Age of Onset  . Cancer Father 29       Brain  . Dementia Mother     Social History Social History   Tobacco Use  . Smoking status: Former Smoker    Packs/day: 1.00    Years: 55.00    Pack years: 55.00    Types: Cigarettes    Quit date: 04/2015    Years since quitting: 3.6  . Smokeless tobacco: Never Used  Substance Use Topics  . Alcohol use: No  Alcohol/week: 0.0 standard drinks  . Drug use: No     Allergies   Patient has no known allergies.   Review of Systems Review of Systems  Constitutional: Negative for chills, diaphoresis, fatigue and fever.  HENT: Negative for congestion and rhinorrhea.   Eyes: Negative for visual disturbance.  Respiratory: Negative for cough, chest tightness, shortness of breath and wheezing.   Cardiovascular: Negative for chest pain.  Gastrointestinal: Negative for abdominal pain, constipation, diarrhea, nausea and vomiting.  Genitourinary: Negative for dysuria, flank pain and frequency.  Musculoskeletal: Negative for back pain, neck pain and neck stiffness.  Skin: Negative for rash and wound.  Neurological: Negative for light-headedness and headaches.  Psychiatric/Behavioral: Negative for agitation.  All other systems reviewed and are negative.    Physical Exam Updated Vital Signs BP 125/67 (BP Location: Right Arm)    Pulse 77   Temp (!) 97.3 F (36.3 C) (Oral)   Resp 16   SpO2 97%   Physical Exam Vitals signs and nursing note reviewed.  Constitutional:      Appearance: He is well-developed.  HENT:     Head: Normocephalic and atraumatic.     Nose: No congestion or rhinorrhea.     Comments: NG tube in place Eyes:     Conjunctiva/sclera: Conjunctivae normal.  Neck:     Musculoskeletal: Neck supple. No muscular tenderness.  Cardiovascular:     Rate and Rhythm: Normal rate and regular rhythm.     Pulses: Normal pulses.  Pulmonary:     Effort: Pulmonary effort is normal. No respiratory distress.     Breath sounds: Rhonchi present.  Abdominal:     General: Abdomen is flat.     Tenderness: There is no abdominal tenderness.  Musculoskeletal:        General: No tenderness.  Skin:    General: Skin is warm and dry.     Capillary Refill: Capillary refill takes less than 2 seconds.  Neurological:     Mental Status: He is alert and oriented to person, place, and time.  Psychiatric:        Mood and Affect: Mood normal.      ED Treatments / Results  Labs (all labs ordered are listed, but only abnormal results are displayed) Labs Reviewed  SARS CORONAVIRUS 2 BY RT PCR (HOSPITAL ORDER, [facility])  CBG MONITORING, ED    EKG None  Radiology Dg Chest 2 View  Result Date: 12/11/2018 CLINICAL DATA:  Hx VATS 10/18/2018, empyema, sobempyema EXAM: CHEST - 2 VIEW COMPARISON:  Radiograph 12/04/2018, CT 11/06/2018 FINDINGS: Stable RIGHT hydropneumothorax with chest tube in place. Feeding tube and PICC line unchanged. LEFT lung is expanded. No pulmonary edema or pneumothorax. IMPRESSION: 1. No change in RIGHT hydropneumothorax with chest tube in place. Electronically Signed   By: Suzy Bouchard M.D.   On: 12/11/2018 12:51    Procedures Procedures (including critical care time)  Medications Ordered in ED Medications  iohexol (OMNIPAQUE) 300 MG/ML solution 15 mL (15  mLs Per Tube Contrast Given 12/12/18 1454)  lidocaine (XYLOCAINE) 2 % viscous mouth solution 6 mL (6 mLs Mouth/Throat Given 12/12/18 1454)     Initial Impression / Assessment and Plan / ED Course  I have reviewed the triage vital signs and the nursing notes.  Pertinent labs & imaging results that were available during my care of the patient were reviewed by me and considered in my medical decision making (see chart for details).  William Kirk is a 78 y.o. male with a past medical history significant for COPD, recent right lower lobectomy status post development of empyema and VATS surgery with chest tube in place and currently dependent on NG tube for feeds presents with clogged NG tube.  Patient reports that he is scheduled to have a PEG tube placed on Friday and when he was getting evaluated by his surgeon yesterday, there was several hours where he was not hooked up to his feeds.  He suspects that the feeding tube clogged during that break.  He reports that the home nurse tried to flush it without success, patient's wife tried to flush without success, and then after come to the emergency department, multiple people tried to flush without success.  Patient unfortunately wait in the waiting room for 15 hours to get back to an exam room for my evaluation.  Patient otherwise reports no symptoms.  They report no new chest pain, shortness breath, fevers, chills, cough, or other complaints.  Patient is just feeling slightly dehydrated and hungry due to the clogged feeding tube not having feeds.  On exam, patient has a feeding tube taped to his right face.  Lungs are slightly coarse.  Chest and abdomen nontender.  Patient resting comfortably with reassuring vitals on arrival.  Patient also requests a Covid test as he is supposed to be getting tested today for procedure on Friday.   As several providers have tried the emergency department clogged, suspect patient needs it replaced.  Will  call interventional radiology to discuss both replacing the NG tube until he get his PEG tube on Friday as well as if they need a rapid Covid test for this procedure versus a send out Covid for the procedure on Friday.  Anticipate touching base with interventional radiology.  2:56 PM NG tube was replaced successfully and Covid test was negative.  Patient we discharged home to get his PEG tube on Friday and was working well.  Patient dischargde in good condition.    Final Clinical Impressions(s) / ED Diagnoses   Final diagnoses:  Feeding difficulty  Clogged feeding tube      Clogged feeding tube    ED Discharge Orders    None      Clinical Impression: 1. Feeding difficulty   2. Encounter for feeding tube placement   3. Clogged feeding tube     Disposition: Discharge  Condition: Good  I have discussed the results, Dx and Tx plan with the pt(& family if present). He/she/they expressed understanding and agree(s) with the plan. Discharge instructions discussed at great length. Strict return precautions discussed and pt &/or family have verbalized understanding of the instructions. No further questions at time of discharge.    New Prescriptions   No medications on file    Follow Up: [facility] [address] [id number] [place] [phone]    Your surgery team        Tegeler, Gwenyth Allegra, MD 12/12/18 1458

## 2018-12-12 NOTE — ED Notes (Signed)
Walked patient to the bathroom patient did well 

## 2018-12-12 NOTE — ED Notes (Signed)
Pt.s wife left to let the dog out at the house .she will return.to be with him.

## 2018-12-12 NOTE — ED Notes (Signed)
Patient verbalizes understanding of discharge instructions. Opportunity for questioning and answers were provided. Armband removed by staff, pt discharged from ED.  

## 2018-12-12 NOTE — ED Notes (Signed)
Check pt. Blood sugar it was 97mg 

## 2018-12-12 NOTE — ED Notes (Signed)
Dr. Tegeler at bedside.  

## 2018-12-14 ENCOUNTER — Telehealth: Payer: Self-pay

## 2018-12-14 ENCOUNTER — Other Ambulatory Visit: Payer: Self-pay

## 2018-12-14 ENCOUNTER — Encounter (HOSPITAL_COMMUNITY): Payer: Self-pay | Admitting: Interventional Radiology

## 2018-12-14 ENCOUNTER — Ambulatory Visit (HOSPITAL_COMMUNITY)
Admission: RE | Admit: 2018-12-14 | Discharge: 2018-12-14 | Disposition: A | Discharge status: Home / Self Care | Payer: No Typology Code available for payment source | Source: Ambulatory Visit | Attending: Thoracic Surgery (Cardiothoracic Vascular Surgery) | Admitting: Thoracic Surgery (Cardiothoracic Vascular Surgery)

## 2018-12-14 DIAGNOSIS — Z7951 Long term (current) use of inhaled steroids: Secondary | ICD-10-CM | POA: Diagnosis not present

## 2018-12-14 DIAGNOSIS — I739 Peripheral vascular disease, unspecified: Secondary | ICD-10-CM | POA: Diagnosis not present

## 2018-12-14 DIAGNOSIS — M19042 Primary osteoarthritis, left hand: Secondary | ICD-10-CM | POA: Diagnosis not present

## 2018-12-14 DIAGNOSIS — R1310 Dysphagia, unspecified: Secondary | ICD-10-CM | POA: Insufficient documentation

## 2018-12-14 DIAGNOSIS — I10 Essential (primary) hypertension: Secondary | ICD-10-CM | POA: Insufficient documentation

## 2018-12-14 DIAGNOSIS — M19041 Primary osteoarthritis, right hand: Secondary | ICD-10-CM | POA: Insufficient documentation

## 2018-12-14 DIAGNOSIS — Z79899 Other long term (current) drug therapy: Secondary | ICD-10-CM | POA: Insufficient documentation

## 2018-12-14 DIAGNOSIS — Z87891 Personal history of nicotine dependence: Secondary | ICD-10-CM | POA: Diagnosis not present

## 2018-12-14 DIAGNOSIS — J449 Chronic obstructive pulmonary disease, unspecified: Secondary | ICD-10-CM | POA: Diagnosis not present

## 2018-12-14 HISTORY — PX: IR GASTROSTOMY TUBE MOD SED: IMG625

## 2018-12-14 LAB — CBC
HCT: 37.3 % — ABNORMAL LOW (ref 39.0–52.0)
Hemoglobin: 11.3 g/dL — ABNORMAL LOW (ref 13.0–17.0)
MCH: 26.7 pg (ref 26.0–34.0)
MCHC: 30.3 g/dL (ref 30.0–36.0)
MCV: 88.2 fL (ref 80.0–100.0)
Platelets: 301 10*3/uL (ref 150–400)
RBC: 4.23 MIL/uL (ref 4.22–5.81)
RDW: 17.2 % — ABNORMAL HIGH (ref 11.5–15.5)
WBC: 11.1 10*3/uL — ABNORMAL HIGH (ref 4.0–10.5)
nRBC: 0 % (ref 0.0–0.2)

## 2018-12-14 LAB — PROTIME-INR
INR: 1.2 (ref 0.8–1.2)
Prothrombin Time: 15 seconds (ref 11.4–15.2)

## 2018-12-14 MED ORDER — CEFAZOLIN SODIUM-DEXTROSE 2-4 GM/100ML-% IV SOLN
2.0000 g | INTRAVENOUS | Status: AC
  Administered 2018-12-14: 2 g via INTRAVENOUS

## 2018-12-14 MED ORDER — SODIUM CHLORIDE 0.9 % IV SOLN: INTRAVENOUS | Status: DC

## 2018-12-14 MED ORDER — FENTANYL CITRATE (PF) 100 MCG/2ML IJ SOLN
INTRAMUSCULAR | Status: AC
  Filled 2018-12-14: qty 2

## 2018-12-14 MED ORDER — CEFAZOLIN SODIUM-DEXTROSE 2-4 GM/100ML-% IV SOLN
INTRAVENOUS | Status: AC
  Filled 2018-12-14: qty 100

## 2018-12-14 MED ORDER — LIDOCAINE HCL 1 % IJ SOLN
INTRAMUSCULAR | Status: AC
  Filled 2018-12-14: qty 20

## 2018-12-14 MED ORDER — GLUCAGON HCL RDNA (DIAGNOSTIC) 1 MG IJ SOLR
INTRAMUSCULAR | Status: AC
  Filled 2018-12-14: qty 1

## 2018-12-14 MED ORDER — MIDAZOLAM HCL 2 MG/2ML IJ SOLN
INTRAMUSCULAR | Status: AC
  Filled 2018-12-14: qty 2

## 2018-12-14 MED ORDER — HYDROMORPHONE HCL 1 MG/ML IJ SOLN: 0.5000 mg | INTRAMUSCULAR | Status: DC | PRN

## 2018-12-14 MED ORDER — IOHEXOL 300 MG/ML  SOLN
50.0000 mL | Freq: Once | INTRAMUSCULAR | Status: AC | PRN
  Administered 2018-12-14: 15 mL

## 2018-12-14 MED ORDER — MIDAZOLAM HCL 2 MG/2ML IJ SOLN
INTRAMUSCULAR | Status: AC | PRN
  Administered 2018-12-14: 1 mg via INTRAVENOUS

## 2018-12-14 MED ORDER — ONDANSETRON HCL 4 MG/2ML IJ SOLN: 4.0000 mg | INTRAMUSCULAR | Status: DC | PRN

## 2018-12-14 MED ORDER — FENTANYL CITRATE (PF) 100 MCG/2ML IJ SOLN
INTRAMUSCULAR | Status: AC | PRN
  Administered 2018-12-14: 50 ug via INTRAVENOUS

## 2018-12-14 MED ORDER — LIDOCAINE HCL 1 % IJ SOLN
INTRAMUSCULAR | Status: AC | PRN
  Administered 2018-12-14: 5 mL

## 2018-12-14 NOTE — Progress Notes (Signed)
Upon discharge instructions with wife it was realized patient did not have home health set up to do teaching in regards to the new G-Tube. Spoke with Dr. Leonarda Salon office and nurse placed order for home health, but unsure when they would be able to come out to see patient and family. Patient and wife instructed to continue feedings with NG Tube until instructed by [place] on how to use G-Tube. Verbalized understanding. Nurse at Dr. Leonarda Salon office also stated they will try to work patient in on Monday to remove NG Tube.

## 2018-12-14 NOTE — Discharge Instructions (Signed)
Gastrostomy Tube Home Guide, Adult °A gastrostomy tube, or G-tube, is a tube that is inserted through the abdomen into the stomach. The tube is used to give feedings and medicines when a person is unable to eat and drink enough on his or her own. °How to care for a G-tube °Supplies needed °· Saline solution or clean, warm water and soap. °· Cotton swab or gauze. °· Precut gauze bandage (dressing) and tape, if needed. °Instructions °1. Wash your hands with soap and water. °2. If there is a dressing between the person's skin and the tube, remove it. °3. Check the area where the tube enters the skin. Check for problems such as: °? Redness. °? Swelling. °? Pus-like drainage. °? Extra skin growth. °4. Moisten the cotton swab with the saline solution or soap and water mixture. Gently clean around the insertion site. Remove any drainage or crusting. °? When the G-tube is first put in, a normal saline solution or water can be used to clean the skin. °? Mild soap and warm water can be used when the skin around the G-tube site has healed. °5. If there should be a dressing between the person's skin and the tube, apply it at this time. °How to flush a G-tube °Flush the G-tube regularly to keep it from clogging. Flush it before and after feedings and as often as told by the health care provider. °Supplies needed °· Purified or sterile water, warmed. If the person has a weak disease-fighting (immune) system, or if he or she has difficulty fighting off infections (is immunocompromised), use only sterile water. °? If you are unsure about the amount of chemical contaminants in purified or drinking water, use sterile water. °? To purify drinking water by boiling: °§ Boil water for at least 1 minute. Keep lid over water while it boils. Allow water to cool to room temperature before using. °· 60cc G-tube syringe. °Instructions °1. Wash your hands with soap and water. °2. Draw up 30 mL of warm water in a syringe. °3. Connect the syringe  to the tube. °4. Slowly and gently push the water into the tube. °G-tube problems and solutions °· If the tube comes out: °? Cover the opening with a clean dressing and tape. °? Call a health care provider right away. °? A health care provider will need to put the tube back in within 4 hours. °· If there is skin or scar tissue growing where the tube enters the skin: °? Keep the area clean and dry. °? Secure the tube with tape so that the tube does not move around too much. °? Call a health care provider. °· If the tube gets clogged: °? Slowly push warm water into the tube with a large syringe. °? Do not force the fluid into the tube or push an object into the tube. °? If you are not able to unclog the tube, call a health care provider right away. °Follow these instructions at home: °Feedings °· Give feedings at room temperature. °· Cover and place unused feedings in the refrigerator. °· If feedings are continuous: °? Do not put more than 4 hours worth of feedings in the feeding bag. °? Stop the feedings when you need to give medicine or flush the tube. Be sure to restart the feedings. °? Make sure the person's head is above his or her stomach (upright position). This will prevent choking and discomfort. °· Replace feeding bags and syringes as told by the health care provider. °· Make   sure the person is in the right position during and after feedings: °? During feedings, the person's position should be in the upright position. °? After a noncontinuous feeding (bolus feeding), have the person stay in the upright position for 1 hour. °General instructions °· Only use syringes made for G-tubes. °· Do not pull or put tension on the tube. °· Clamp the tube before removing the cap or disconnecting a syringe. °· Measure the length of the G-tube every day from the insertion site to the end of the tube. °· If the person's G-tube has a balloon, check the fluid in the balloon every week. The amount of fluid that should be in  the balloon can be found in the manufacturer’s specifications. °· Make sure the person takes care of his or her oral health, such as by brushing his or her teeth. °· Remove excess air from the G-tube as told by the person's health care provider. This is called "venting." °· Keep the area where the tube enters the skin clean and dry. °· Do not push feedings, medicines, or flushes rapidly. °Contact a health care provider if: °· The person with the tube has any of these problems: °? Constipation. °? Fever. °· There is a large amount of fluid or mucus-like liquid leaking from the tube. °· Skin or scar tissue appears to be growing where the tube enters the skin. °· The length of tube from the insertion site to the G-tube gets longer. °Get help right away if: °· The person with the tube has any of these problems: °? Severe abdominal pain. °? Severe tenderness. °? Severe bloating. °? Nausea. °? Vomiting. °? Trouble breathing. °? Shortness of breath. °· Any of these problems happen in the area where the tube enters the skin: °? Redness, irritation, swelling, or soreness. °? Pus-like discharge. °? A bad smell. °· The tube is clogged and cannot be flushed. °· The tube comes out. °Summary °· A gastrostomy tube, or G-tube, is a tube that is inserted through the abdomen into the stomach. The tube is used to give feedings and medicines when a person is unable to eat and drink enough on his or her own. °· Check and clean the insertion site daily as told by the person's health care provider. °· Flush the G-tube regularly to keep it from clogging. Flush it before and after feedings and as often as told by the person's health care provider. °· Keep the area where the tube enters the skin clean and dry. °This information is not intended to replace advice given to you by your health care provider. Make sure you discuss any questions you have with your health care provider. °Document Released: 04/04/2001 Document Revised: 01/06/2017  Document Reviewed: 03/21/2016 °[employer] Patient Education © 2020 [employer] ° °

## 2018-12-14 NOTE — H&P (Signed)
Chief Complaint: Patient was seen in consultation today for dysphagia  Referring Physician(s): Hendrickson,Steven C  Supervising Physician: Irish Lack  Patient Status: [facility] - Out-pt  History of Present Illness: William Kirk is a 78 y.o. male with a past medical history significant for COPD known to IR from recent admission at which time he required a chest tube placement for empyema.  Patient with extensive pulmonary history and his recent hospitalization was complicated by Klebsiella pneumoniae and streptococcus constellatus requiring VATS redo, decortication, and chronic chest tube/drain.  He developed dysphagia possibly secondary to deconditioning and was discharged home with feeding tube in place.  He continues tube feeding at this time.  He is referred to IR today for percutaneous gastrostomy tube placement.    Patient assessed at bedside.  He is not sure why he cannot swallow.  Review of available records does not show changes to anatomy or history of esophageal leak.  He did undergo swallow study during his hospitalization 11/16/18 which revealed severe pharyngeal dysphagia with aspiration of all food and fluid consistencies.  He is agreeable to feeding tube placement at this time.   He presents today in his usual state of health.  Reports he did have to go to the ED a few days ago due to a clogged NGT.  He has been NPO.  He does not take blood thinners.  He continues with active drainage from his right chest.  Past Medical History:  Diagnosis Date   Anxiety    Arthritis    "hands" (09/21/2016)   Bleeding stomach ulcer 2000s   COPD (chronic obstructive pulmonary disease) (HCC)    Cough    Depression    Dyspnea    Hypertension    Pneumonia    "now and several times before this" (09/21/2016)   PVD (peripheral vascular disease) (HCC) 2014   prev PTCA on RLE    Past Surgical History:  Procedure Laterality Date   IR ANGIOGRAM SELECTIVE EACH ADDITIONAL  VESSEL  07/26/2018   IR ANGIOGRAM SELECTIVE EACH ADDITIONAL VESSEL  07/26/2018   IR ANGIOGRAM SELECTIVE EACH ADDITIONAL VESSEL  07/26/2018   IR EMBO ART  VEN HEMORR LYMPH EXTRAV  INC GUIDE ROADMAPPING  07/26/2018   IR US GUIDE VASC ACCESS RIGHT  07/26/2018   JOINT REPLACEMENT     KNEE ARTHROSCOPY Right X 1   TOTAL HIP ARTHROPLASTY Right 2009   VIDEO ASSISTED THORACOSCOPY (VATS)/ LOBECTOMY Right 10/18/2018   Procedure: VIDEO ASSISTED THORACOSCOPY (VATS)/ right lower LOBECTOMY;  Surgeon: Loreli Slot, MD;  Location: [facility] OR;  Service: Thoracic;  Laterality: Right;   VIDEO ASSISTED THORACOSCOPY (VATS)/DECORTICATION Right 11/09/2018   Procedure: REDO VIDEO ASSISTED THORACOSCOPY (VATS)/DECORTICATION/DRAIN EFFUSION;  Surgeon: Loreli Slot, MD;  Location: [facility] OR;  Service: Thoracic;  Laterality: Right;   VIDEO BRONCHOSCOPY N/A 07/27/2018   Procedure: VIDEO BRONCHOSCOPY WITHOUT FLUORO;  Surgeon: Leslye Peer, MD;  Location: [facility] OR;  Service: Cardiopulmonary;  Laterality: N/A;   VIDEO BRONCHOSCOPY N/A 11/09/2018   Procedure: VIDEO BRONCHOSCOPY;  Surgeon: Loreli Slot, MD;  Location: [facility] OR;  Service: Thoracic;  Laterality: N/A;    Allergies: Patient has no known allergies.  Medications: Prior to Admission medications   Medication Sig Start Date End Date Taking? Authorizing Provider  albuterol (VENTOLIN HFA) 108 (90 Base) MCG/ACT inhaler Inhale 2 puffs into the lungs every 4 (four) hours as needed for wheezing or shortness of breath. 07/11/18  Yes Hongalgi, Maximino Greenland, MD  Amino Acids-Protein Hydrolys (FEEDING SUPPLEMENT,  PRO-STAT SUGAR FREE 64,) LIQD Place 30 mLs into feeding tube 2 (two) times daily. 11/22/18  Yes Conte, Tessa N, PA-C  atorvastatin (LIPITOR) 20 MG tablet Place 1 tablet (20 mg total) into feeding tube at bedtime. 11/22/18  Yes Conte, Tessa N, PA-C  budesonide-formoterol (SYMBICORT) 160-4.5 MCG/ACT inhaler Inhale 2 puffs into the lungs 2 (two) times daily.    Yes [provider]  diclofenac sodium (VOLTAREN) 1 % GEL Apply 2 g topically 4 (four) times daily as needed (pain).    Yes [provider]  docusate (COLACE) 50 MG/5ML liquid Place 10 mLs (100 mg total) into feeding tube at bedtime. Patient taking differently: Place 100 mg into feeding tube at bedtime as needed for mild constipation.  11/22/18  Yes Conte, Tessa N, PA-C  folic acid (FOLVITE) 1 MG tablet Place 1 tablet (1 mg total) into feeding tube daily. 11/23/18  Yes Conte, Tessa N, PA-C  gabapentin (NEURONTIN) 250 MG/5ML solution Place 6 mLs (300 mg total) into feeding tube at bedtime. 11/22/18 12/22/18 Yes Conte, Tessa N, PA-C  levalbuterol (XOPENEX) 0.63 MG/3ML nebulizer solution Take 3 mLs (0.63 mg total) by nebulization every 4 (four) hours as needed for wheezing or shortness of breath. 11/22/18  Yes Conte, Tessa N, PA-C  lidocaine (LIDODERM) 5 % Place 1 patch onto the skin daily as needed (pain).    Yes [provider]  metoprolol tartrate (LOPRESSOR) 25 MG tablet Place 12.5 mg into feeding tube daily. CRUSH MED   Yes [provider]  Multiple Vitamin (MULTIVITAMIN) LIQD Place 15 mLs into feeding tube daily. 11/23/18 12/23/18 Yes Conte, Tessa N, PA-C  Nutritional Supplements (FEEDING SUPPLEMENT, OSMOLITE 1.5 CAL,) LIQD Place 1,000 mLs into feeding tube continuous. 11/22/18 12/22/18 Yes Conte, Tessa N, PA-C  sertraline (ZOLOFT) 100 MG tablet Place 2 tablets (200 mg total) into feeding tube daily. 11/23/18  Yes Conte, Tessa N, PA-C  Tiotropium Bromide Monohydrate (SPIRIVA RESPIMAT) 2.5 MCG/ACT AERS Inhale 2 puffs into the lungs daily.   Yes [provider]  traZODone (DESYREL) 50 MG tablet Place 1 tablet (50 mg total) into feeding tube at bedtime. 11/22/18  Yes Conte, Tessa N, PA-C  vitamin B-12 1000 MCG tablet Place 1 tablet (1,000 mcg total) into feeding tube daily. 11/23/18  Yes Sharlene Dory, PA-C  Water For Irrigation, Sterile (FREE WATER) SOLN  Place 200 mLs into feeding tube 4 (four) times daily. 11/22/18 12/22/18 Yes Conte, Tessa N, PA-C  metoprolol tartrate (LOPRESSOR) 25 mg/10 mL SUSP Place 5 mLs (12.5 mg total) into feeding tube daily. Patient not taking: Reported on 12/13/2018 11/23/18 12/23/18  Sharlene Dory, PA-C     Family History  Problem Relation Age of Onset   Cancer Father 35       Brain   Dementia Mother     Social History   Socioeconomic History   Marital status: Married    Spouse name: Not on file   Number of children: Not on file   Years of education: 16   Highest education level: Not on file  Occupational History   Occupation: Patent examiner  Social Needs   Financial resource strain: Not on file   Food insecurity    Worry: Not on file    Inability: Not on file   Transportation needs    Medical: Not on file    Non-medical: Not on file  Tobacco Use   Smoking status: Former Smoker    Packs/day: 1.00    Years: 55.00  Pack years: 55.00    Types: Cigarettes    Quit date: 04/2015    Years since quitting: 3.6   Smokeless tobacco: Never Used  Substance and Sexual Activity   Alcohol use: No    Alcohol/week: 0.0 standard drinks   Drug use: No   Sexual activity: Not Currently    Partners: Female  Lifestyle   Physical activity    Days per week: Not on file    Minutes per session: Not on file   Stress: Not on file  Relationships   Social connections    Talks on phone: Not on file    Gets together: Not on file    Attends religious service: Not on file    Active member of club or organization: Not on file    Attends meetings of clubs or organizations: Not on file    Relationship status: Not on file  Other Topics Concern   Not on file  Social History Narrative   Not on file     Review of Systems: A 12 point ROS discussed and pertinent positives are indicated in the HPI above.  All other systems are negative.  Review of Systems  Constitutional: Negative for fatigue  and fever.  Respiratory: Negative for cough and shortness of breath.   Cardiovascular: Negative for chest pain.  Gastrointestinal: Negative for abdominal pain, nausea and vomiting.  Genitourinary: Negative for dysuria.  Musculoskeletal: Negative for back pain.  Psychiatric/Behavioral: Negative for behavioral problems and confusion.    Vital Signs: BP 105/63    Pulse 87    Temp (!) 97.4 F (36.3 C) (Oral)    Resp 18    Ht 5\' 10"  (1.778 m)    Wt 131 lb (59.4 kg)    SpO2 97%    BMI 18.80 kg/m   Physical Exam Vitals signs and nursing note reviewed.  HENT:     Mouth/Throat:     Mouth: Mucous membranes are moist.     Pharynx: Oropharynx is clear.  Cardiovascular:     Rate and Rhythm: Normal rate and regular rhythm.  Pulmonary:     Effort: Pulmonary effort is normal. No respiratory distress.     Breath sounds: Normal breath sounds.     Comments: Colostomy bag in place on right chest with active, dark brown drainage. Abdominal:     General: Abdomen is flat.     Palpations: Abdomen is soft.  Skin:    General: Skin is warm and dry.  Neurological:     General: No focal deficit present.     Mental Status: He is alert and oriented to person, place, and time. Mental status is at baseline.  Psychiatric:        Mood and Affect: Mood normal.        Behavior: Behavior normal.        Thought Content: Thought content normal.        Judgment: Judgment normal.      MD Evaluation Airway: WNL Heart: WNL Abdomen: WNL Chest/ Lungs: WNL ASA  Classification: 3 Mallampati/Airway Score: One   Imaging: Dg Chest 2 View  Result Date: 12/11/2018 CLINICAL DATA:  Hx VATS 10/18/2018, empyema, sobempyema EXAM: CHEST - 2 VIEW COMPARISON:  Radiograph 12/04/2018, CT 11/06/2018 FINDINGS: Stable RIGHT hydropneumothorax with chest tube in place. Feeding tube and PICC line unchanged. LEFT lung is expanded. No pulmonary edema or pneumothorax. IMPRESSION: 1. No change in RIGHT hydropneumothorax with chest  tube in place. Electronically Signed   By: Roseanne Reno  Amil Amen M.D.   On: 12/11/2018 12:51   Dg Chest 2 View  Result Date: 12/04/2018 CLINICAL DATA:  78 year old male with a history of empyema EXAM: CHEST - 2 VIEW COMPARISON:  Multiple prior most recent plain film 11/27/2018 FINDINGS: Cardiomediastinal silhouette unchanged in size and contour with partial obscuration of the right heart border. Persistent opacity at the right lung base obscuring the right hemidiaphragm and the right heart border. Unchanged thoracostomy tube. Pleuroparenchymal thickening with no visualized apical pneumothorax. Similar appearance of focal lucency on the lateral view, potentially persistent component of hydropneumothorax. Right upper extremity PICC is unchanged. Enteric tube unchanged. Surgical changes in the right hilar region and at the right lung base. Left lung well aerated. Similar appearance of pleuroparenchymal thickening/scarring at the left lung apex. IMPRESSION: Postoperative changes again demonstrated of the right hemithorax with unchanged thoracostomy tube, persistent right basilar opacity, chronic lung changes, and persisting pleuroparenchymal thickening. Unchanged right upper extremity PICC and enteric tube. Electronically Signed   By: Gilmer Mor D.O.   On: 12/04/2018 11:36   Dg Chest 2 View  Result Date: 11/27/2018 CLINICAL DATA:  Right-sided empyema, status post VATS EXAM: CHEST - 2 VIEW COMPARISON:  11/21/2018 FINDINGS: No significant change in radiographic examination with a moderate right hydropneumothorax and volume loss of the right hemithorax, including a posterior loculated air component, and right-sided chest tube in position about the right medial posterior pleural space. The left lung is normally aerated. The heart and mediastinum are partially obscured although otherwise unremarkable. Right upper extremity PICC. Partially imaged enteric feeding tube. IMPRESSION: No significant change in radiographic  examination with a moderate right hydropneumothorax and volume loss of the right hemithorax, including a posterior loculated air component, and right-sided chest tube in position about the right medial posterior pleural space. Electronically Signed   By: Lauralyn Primes M.D.   On: 11/27/2018 12:52   Dg Abd 1 View  Result Date: 12/12/2018 CLINICAL DATA:  78 year old male with feeding tube in place. EXAM: ABDOMEN - 1 VIEW COMPARISON:  Abdominal radiograph dated 11/12/2018 FINDINGS: Partially visualized feeding tube with tip in the distal second portion of the duodenum. Contrast injected through the tube opacifies the distal duodenum and proximal jejunum. Several calcific density over the left renal silhouette, likely renal stones. There is atherosclerotic calcification of the aorta. Degenerative changes of the spine. IMPRESSION: Partially visualized feeding tube with tip at the junction of the second and third portion of the duodenum. Electronically Signed   By: [name] M.D.   On: 12/12/2018 14:58   Dg Chest Port 1 View  Result Date: 11/21/2018 CLINICAL DATA:  Chest tube in empyema EXAM: PORTABLE CHEST 1 VIEW COMPARISON:  Yesterday FINDINGS: Stable right basal hydropneumothorax with small gas component and no mass effect. Stable chest tube positioning. Clear left lung. Normal heart size and mediastinal contours when accounting for rotation. Right PICC with tip at the SVC IMPRESSION: Unchanged right basal hydropneumothorax. Electronically Signed   By: Marnee Spring M.D.   On: 11/21/2018 08:57   Dg Chest Port 1 View  Result Date: 11/20/2018 CLINICAL DATA:  Chest tube in pneumothorax EXAM: PORTABLE CHEST 1 VIEW COMPARISON:  Yesterday FINDINGS: Postoperative right chest with volume loss. Right-sided chest tube overlapping a small basal hydropneumothorax with slightly increased gas component. Right PICC with tip at the SVC. Relatively clear left lung which is emphysematous. Normal heart size. Feeding  tube which enters the stomach. IMPRESSION: Right hydropneumothorax with slight increase in the gas component. Electronically Signed  By: Marnee Spring M.D.   On: 11/20/2018 09:20   Dg Chest Port 1 View  Result Date: 11/19/2018 CLINICAL DATA:  Follow-up empyema. Chest tubes. EXAM: PORTABLE CHEST 1 VIEW COMPARISON:  11/18/2018 FINDINGS: Left chest remains clear. Right-sided chest tube remains in place. Small amount of pleural air at the right lateral base, slightly diminished. No worsening or new finding. Right arm PICC tip remains in the SVC. Soft feeding tube enters the abdomen. IMPRESSION: Small amount of pleural air at the right lateral base, slightly diminished. No worsening or new finding. Electronically Signed   By: Paulina Fusi M.D.   On: 11/19/2018 10:38   Dg Chest Port 1 View  Result Date: 11/18/2018 CLINICAL DATA:  Reason for exam: pneumothorax, pleural effusion Hx HTN, COPD, pneumonia EXAM: PORTABLE CHEST 1 VIEW COMPARISON:  Radiograph 11/17/2018 FINDINGS: Two RIGHT chest tube in place small lateral. Small hydro pneumothorax at the RIGHT lateral lung base. Volume loss in the RIGHT hemithorax. Small effusion. Feeding tube extends the stomach. RIGHT PICC line tip in distal SVC. LEFT lung clear and hyperinflated. IMPRESSION: 1. Three RIGHT chest tubes in place. Small hydropneumothorax at the RIGHT lung base. 2. LEFT lung clear. 3. No significant change. Electronically Signed   By: Genevive Bi M.D.   On: 11/18/2018 07:23   Dg Chest Port 1 View  Result Date: 11/17/2018 CLINICAL DATA:  Empyema. EXAM: PORTABLE CHEST 1 VIEW COMPARISON:  November 16, 2018. FINDINGS: Stable cardiomediastinal silhouette. Feeding tube is seen entering stomach. Stable position of 2 right-sided chest tubes. Mild right basilar pneumothorax is noted. Probable effusion is noted. Right-sided PICC line is now noted with tip in expected position of the SVC. Left lung is clear. Mediastinal shift to the right is noted  suggesting volume loss or atelectasis. Bony thorax is unremarkable. IMPRESSION: Stable position of 2 right-sided chest tubes. Mild right basilar pneumothorax is noted with possible associated developing effusion. Right basilar atelectasis or volume loss is noted with mediastinal shift to the right. Electronically Signed   By: Lupita Raider M.D.   On: 11/17/2018 09:38   Dg Chest Port 1 View  Result Date: 11/16/2018 CLINICAL DATA:  78 year old male with history of shortness of breath. Pneumothorax. Chest tube. EXAM: PORTABLE CHEST 1 VIEW COMPARISON:  Chest x-ray 11/15/2018. FINDINGS: Right internal jugular central venous catheter with tip terminating in the mid superior vena cava. A feeding tube is seen extending into the abdomen, however, the tip of the feeding tube extends below the lower margin of the image. Two right-sided chest tubes in position with tips projecting over the right mid to upper lungs. Enlarging small to moderate right pneumothorax in the lower right hemithorax. No definite pleural fluid. Patchy multifocal opacities in the right lung may reflect areas of scarring and/or subsegmental atelectasis. Left lung is clear. No left pleural effusion. No evidence of pulmonary edema. Heart size is normal. Upper mediastinal contours are distorted by patient positioning. Aortic atherosclerosis. Surgical staples project over the lateral right hemithorax. Small amount of subcutaneous emphysema in the right chest wall. IMPRESSION: 1. Support apparatus, as above. 2. Enlarging small to moderate right basilar pneumothorax. 3. Radiographic appearance the chest is otherwise essentially unchanged, as above. Electronically Signed   By: Trudie Reed M.D.   On: 11/16/2018 09:29   Dg Chest Port 1 View  Result Date: 11/15/2018 CLINICAL DATA:  Pneumothorax. Empyema. Chest tubes in place. EXAM: PORTABLE CHEST 1 VIEW COMPARISON:  11/14/2018 FINDINGS: Chest tubes, feeding tube and central line all  appear unchanged.  Small right basal lateral pneumothorax is unchanged. Loculated empyema persists at the right lung base posterior medially. Left lung remains clear. Heart size and vascularity are normal. IMPRESSION: 1. No change in the appearance of the chest since the prior study. 2. No change in the loculated empyema at the right lung base. Electronically Signed   By: Francene Boyers M.D.   On: 11/15/2018 09:58   Dg Swallowing Func-speech Pathology  Result Date: 11/16/2018 Objective Swallowing Evaluation: Type of Study: MBS-Modified Barium Swallow Study  Patient Details Name: JEMERE SANCHO MRN: [id number] Date of Birth: 1940-10-15 Today's Date: 11/16/2018 Time: SLP Start Time (ACUTE ONLY): 1410 -SLP Stop Time (ACUTE ONLY): 1440 SLP Time Calculation (min) (ACUTE ONLY): 30 min Past Medical History: Past Medical History: Diagnosis Date  Anxiety   Arthritis   "hands" (09/21/2016)  Bleeding stomach ulcer 2000s  COPD (chronic obstructive pulmonary disease) (HCC)   Cough   Depression   Dyspnea   Hypertension   Pneumonia   "now and several times before this" (09/21/2016)  PVD (peripheral vascular disease) (HCC) 2014  prev PTCA on RLE Past Surgical History: Past Surgical History: Procedure Laterality Date  IR ANGIOGRAM SELECTIVE EACH ADDITIONAL VESSEL  07/26/2018  IR ANGIOGRAM SELECTIVE EACH ADDITIONAL VESSEL  07/26/2018  IR ANGIOGRAM SELECTIVE EACH ADDITIONAL VESSEL  07/26/2018  IR EMBO ART  VEN HEMORR LYMPH EXTRAV  INC GUIDE ROADMAPPING  07/26/2018  IR US GUIDE VASC ACCESS RIGHT  07/26/2018  JOINT REPLACEMENT    KNEE ARTHROSCOPY Right X 1  TOTAL HIP ARTHROPLASTY Right 2009  VIDEO ASSISTED THORACOSCOPY (VATS)/ LOBECTOMY Right 10/18/2018  Procedure: VIDEO ASSISTED THORACOSCOPY (VATS)/ right lower LOBECTOMY;  Surgeon: Loreli Slot, MD;  Location: [facility] OR;  Service: Thoracic;  Laterality: Right;  VIDEO ASSISTED THORACOSCOPY (VATS)/DECORTICATION Right 11/09/2018  Procedure: REDO VIDEO ASSISTED THORACOSCOPY  (VATS)/DECORTICATION/DRAIN EFFUSION;  Surgeon: Loreli Slot, MD;  Location: [facility] OR;  Service: Thoracic;  Laterality: Right;  VIDEO BRONCHOSCOPY N/A 07/27/2018  Procedure: VIDEO BRONCHOSCOPY WITHOUT FLUORO;  Surgeon: Leslye Peer, MD;  Location: [facility] OR;  Service: Cardiopulmonary;  Laterality: N/A;  VIDEO BRONCHOSCOPY N/A 11/09/2018  Procedure: VIDEO BRONCHOSCOPY;  Surgeon: Loreli Slot, MD;  Location: [facility] OR;  Service: Thoracic;  Laterality: N/A; HPI: Patient is a 78 y.o. male with PMH: COPD, tobacco abuse, recurrent PNA, bronchiectasis, recurrent hemoptysis.  He has had multiple recurrent PNA's over the past several years. He underwent a right lower lobectomy on 9/10. He presented to hospital on 9/29 with increasing right sided chest pain and SOB. CT revealed complex pleural effusion. He underwent a redo right thoractomy for drainage of the empyema. He was intubated for procedure on 10/2 and extubated on 10/3 at 1230. He has history of dysphagia with 2018 MBS report recommending nectar thick liquids and chin tuck with swallow.  Subjective: pleasant upright in chair for procedure Assessment / Plan / Recommendation CHL IP CLINICAL IMPRESSIONS 11/16/2018 Clinical Impression Severe pharyngeal dysphagia persists with sensory and motor impairments, likely of  multifactorial etiology. During the swallow penetration and aspiration exhibited with all POs; inconsistently sensed (1/2 teaspoons of nectar thick, honey thick, and puree POs). Weaker cough was ineffective at expelling penetrates and aspirates. Global pharyneal deficits include reduced base of tongue retraction, incomplete epiglottic deflection/poor laryngeal vestibule closure, decreased pharyngeal stripping wave, decreased glottic closure, reduced laryngeal elevation, and diminished sensation. A safe PO diet cannot be recommended. SLP to follow up for intensive pharyngeal strengthening exercises. Pt was educated on findings/recommendations. SLP  Visit Diagnosis Dysphagia, pharyngeal phase (R13.13) Attention and concentration deficit following -- Frontal lobe and executive function deficit following -- Impact on safety and function Severe aspiration risk   CHL IP TREATMENT RECOMMENDATION 11/16/2018 Treatment Recommendations Therapy as outlined in treatment plan below   Prognosis 11/16/2018 Prognosis for Safe Diet Advancement Fair Barriers to Reach Goals Severity of deficits;Time post onset Barriers/Prognosis Comment -- CHL IP DIET RECOMMENDATION 11/16/2018 SLP Diet Recommendations NPO;Alternative means - temporary Liquid Administration via -- Medication Administration Via alternative means Compensations -- Postural Changes --   CHL IP OTHER RECOMMENDATIONS 11/16/2018 Recommended Consults -- Oral Care Recommendations Oral care QID Other Recommendations --   CHL IP FOLLOW UP RECOMMENDATIONS 11/16/2018 Follow up Recommendations Other (comment)   CHL IP FREQUENCY AND DURATION 11/16/2018 Speech Therapy Frequency (ACUTE ONLY) min 3x week Treatment Duration 2 weeks      CHL IP ORAL PHASE 11/16/2018 Oral Phase WFL Oral - Pudding Teaspoon -- Oral - Pudding Cup -- Oral - Honey Teaspoon -- Oral - Honey Cup -- Oral - Nectar Teaspoon -- Oral - Nectar Cup -- Oral - Nectar Straw -- Oral - Thin Teaspoon -- Oral - Thin Cup -- Oral - Thin Straw -- Oral - Puree -- Oral - Mech Soft -- Oral - Regular -- Oral - Multi-Consistency -- Oral - Pill -- Oral Phase - Comment --  CHL IP PHARYNGEAL PHASE 11/16/2018 Pharyngeal Phase Impaired Pharyngeal- Pudding Teaspoon -- Pharyngeal -- Pharyngeal- Pudding Cup -- Pharyngeal -- Pharyngeal- Honey Teaspoon Reduced pharyngeal peristalsis;Reduced epiglottic inversion;Reduced anterior laryngeal mobility;Reduced laryngeal elevation;Reduced airway/laryngeal closure;Reduced tongue base retraction;Penetration/Aspiration during swallow;Penetration/Apiration after swallow;Moderate aspiration;Pharyngeal residue - valleculae;Pharyngeal residue -  pyriform;Compensatory strategies attempted (with notebox) Pharyngeal Material enters airway, passes BELOW cords without attempt by patient to eject out (silent aspiration);Material enters airway, passes BELOW cords and not ejected out despite cough attempt by patient Pharyngeal- Honey Cup -- Pharyngeal -- Pharyngeal- Nectar Teaspoon Reduced pharyngeal peristalsis;Reduced epiglottic inversion;Reduced anterior laryngeal mobility;Reduced laryngeal elevation;Reduced airway/laryngeal closure;Reduced tongue base retraction;Penetration/Aspiration during swallow;Penetration/Apiration after swallow;Moderate aspiration;Pharyngeal residue - valleculae;Pharyngeal residue - pyriform Pharyngeal Material enters airway, passes BELOW cords without attempt by patient to eject out (silent aspiration) Pharyngeal- Nectar Cup -- Pharyngeal -- Pharyngeal- Nectar Straw -- Pharyngeal -- Pharyngeal- Thin Teaspoon -- Pharyngeal -- Pharyngeal- Thin Cup -- Pharyngeal -- Pharyngeal- Thin Straw -- Pharyngeal -- Pharyngeal- Puree Penetration/Aspiration during swallow;Penetration/Apiration after swallow;Reduced pharyngeal peristalsis;Reduced epiglottic inversion;Reduced anterior laryngeal mobility;Reduced laryngeal elevation;Reduced airway/laryngeal closure;Reduced tongue base retraction;Pharyngeal residue - valleculae;Pharyngeal residue - pyriform;Moderate aspiration Pharyngeal Material enters airway, passes BELOW cords without attempt by patient to eject out (silent aspiration);Material enters airway, passes BELOW cords and not ejected out despite cough attempt by patient Pharyngeal- Mechanical Soft -- Pharyngeal -- Pharyngeal- Regular -- Pharyngeal -- Pharyngeal- Multi-consistency -- Pharyngeal -- Pharyngeal- Pill -- Pharyngeal -- Pharyngeal Comment --  CHL IP CERVICAL ESOPHAGEAL PHASE 11/24/2016 Cervical Esophageal Phase Impaired Pudding Teaspoon -- Pudding Cup -- Honey Teaspoon -- Honey Cup -- Nectar Teaspoon -- Nectar Cup -- Nectar Straw --  Thin Teaspoon -- Thin Cup -- Thin Straw -- Puree -- Mechanical Soft -- Regular -- Multi-consistency -- Pill -- Cervical Esophageal Comment decreased clearance into cervical esophagus, upon esopahgeal sweep at end of test, esophagus appeared clear Chelsea E Hartness MA, CCC-SLP [facility] 11/16/2018, 3:50 PM              [place] Ekg Site Rite  Result Date: 11/15/2018 If Site Rite image not attached, placement could not be confirmed due to current cardiac rhythm.  Labs:  CBC: Recent Labs    11/13/18 0500 11/16/18 0345 11/20/18 0343 12/14/18 0927  WBC 6.0 9.0 10.4 11.1*  HGB 11.0* 11.7* 11.5* 11.3*  HCT 35.3* 36.4* 36.2* 37.3*  PLT 457* 381 350 301    COAGS: Recent Labs    07/24/18 2135 10/16/18 1123 11/08/18 2024  INR 1.1 1.1 1.4*  APTT 34 29  --     BMP: Recent Labs    11/13/18 0500 11/14/18 0408 11/16/18 0345 11/20/18 0343  NA 141 140 139 137  K 3.4* 3.9 4.1 4.7  CL 104 105 101 102  CO2 29 29 29 28   GLUCOSE 97 99 92 105*  BUN 7* 12 15 31*  CALCIUM 7.8* 7.8* 8.3* 8.5*  CREATININE 0.69 0.75 0.61 0.71  GFRNONAA >60 >60 >60 >60  GFRAA >60 >60 >60 >60    LIVER FUNCTION TESTS: Recent Labs    10/16/18 1123 10/20/18 0301 11/11/18 0508 11/20/18 0343  BILITOT 0.1* 0.4 0.3 0.6  AST 25 23 25 25   ALT 21 8 28 23   ALKPHOS 91 75 80 107  PROT 7.8 5.7* 4.9* 6.6  ALBUMIN 3.2* 2.5* 1.4* 2.0*    TUMOR MARKERS: No results for input(s): AFPTM, CEA, CA199, CHROMGRNA in the last 8760 hours.  Assessment and Plan: Patient with past medical history of COPD, bronchiectasis, s/p VATS x2 presents with complaint of dysphagia.  IR consulted for percutaneous gastrostomy tube placement at the request of Dr. Dorris Fetch.  Patient presents today in their usual state of health. He has an NGT in place.  Continues to drain from the right chest.  He has been NPO and is not currently on blood thinners.    Risks and benefits image guided gastrostomy tube placement was  discussed with the patient including, but not limited to the need for a barium enema during the procedure, bleeding, infection, peritonitis and/or damage to adjacent structures.  All of the patient's questions were answered, patient is agreeable to proceed.  Consent signed and in chart.  Thank you for this interesting consult.  I greatly enjoyed meeting JARETT HAMMACK and look forward to participating in their care.  A copy of this report was sent to the requesting provider on this date.  Electronically Signed: Hoyt Koch, PA 12/14/2018, 11:14 AM   I spent a total of  30 Minutes   in face to face in clinical consultation, greater than 50% of which was counseling/coordinating care for pharyngeal dysphagia.

## 2018-12-14 NOTE — Procedures (Signed)
Interventional Radiology Procedure Note  Procedure: Percutaneous gastrostomy  Complications: None  Estimated Blood Loss: < 10 mL  Findings: 20 Fr bumper retention gastrostomy tube placed with tip in body of stomach.  OK to use in 24 hours.  Ekansh Sherk T. Payton Prinsen, M.D Pager:  [phone]    

## 2018-12-14 NOTE — Telephone Encounter (Signed)
[place]/ Supplies for [employer] Tube and nutrition [employer] home health/ nursing care/PT

## 2018-12-15 ENCOUNTER — Other Ambulatory Visit: Payer: Self-pay | Admitting: Physician Assistant

## 2018-12-16 DIAGNOSIS — I739 Peripheral vascular disease, unspecified: Secondary | ICD-10-CM | POA: Diagnosis not present

## 2018-12-16 DIAGNOSIS — J9611 Chronic respiratory failure with hypoxia: Secondary | ICD-10-CM | POA: Diagnosis not present

## 2018-12-16 DIAGNOSIS — J449 Chronic obstructive pulmonary disease, unspecified: Secondary | ICD-10-CM | POA: Diagnosis not present

## 2018-12-16 DIAGNOSIS — L89322 Pressure ulcer of left buttock, stage 2: Secondary | ICD-10-CM | POA: Diagnosis not present

## 2018-12-16 DIAGNOSIS — I4891 Unspecified atrial fibrillation: Secondary | ICD-10-CM | POA: Diagnosis not present

## 2018-12-16 DIAGNOSIS — Z4682 Encounter for fitting and adjustment of non-vascular catheter: Secondary | ICD-10-CM | POA: Diagnosis not present

## 2018-12-16 DIAGNOSIS — J869 Pyothorax without fistula: Secondary | ICD-10-CM | POA: Diagnosis not present

## 2018-12-16 DIAGNOSIS — T8143XA Infection following a procedure, organ and space surgical site, initial encounter: Secondary | ICD-10-CM | POA: Diagnosis not present

## 2018-12-16 DIAGNOSIS — I10 Essential (primary) hypertension: Secondary | ICD-10-CM | POA: Diagnosis not present

## 2018-12-17 ENCOUNTER — Telehealth: Payer: Self-pay

## 2018-12-17 ENCOUNTER — Ambulatory Visit: Payer: No Typology Code available for payment source

## 2018-12-17 DIAGNOSIS — E871 Hypo-osmolality and hyponatremia: Secondary | ICD-10-CM | POA: Diagnosis not present

## 2018-12-17 DIAGNOSIS — E43 Unspecified severe protein-calorie malnutrition: Secondary | ICD-10-CM | POA: Diagnosis not present

## 2018-12-17 NOTE — Telephone Encounter (Signed)
Pt's wife calls office this AM to report that pt's NG tube came out on Saturday night. Reports part of the tubing was "looped" in pt's nose and mouth, so she pulled it completely out. States pt is doing well after having G-tube placed on Friday. Pt complaining of some tenderness at site but doesn't want to take Oxycodone, so wife asks if she can crush Tylenol to give pt instead. Advised that this is fine. She has been instructed by home health RN about flushing tube, etc. Pt to f/u w/ Dr. Roxan Hockey on 12/25/18.

## 2018-12-18 DIAGNOSIS — J9611 Chronic respiratory failure with hypoxia: Secondary | ICD-10-CM | POA: Diagnosis not present

## 2018-12-18 DIAGNOSIS — I739 Peripheral vascular disease, unspecified: Secondary | ICD-10-CM | POA: Diagnosis not present

## 2018-12-18 DIAGNOSIS — I4891 Unspecified atrial fibrillation: Secondary | ICD-10-CM | POA: Diagnosis not present

## 2018-12-18 DIAGNOSIS — E871 Hypo-osmolality and hyponatremia: Secondary | ICD-10-CM | POA: Diagnosis not present

## 2018-12-18 DIAGNOSIS — J869 Pyothorax without fistula: Secondary | ICD-10-CM | POA: Diagnosis not present

## 2018-12-18 DIAGNOSIS — E43 Unspecified severe protein-calorie malnutrition: Secondary | ICD-10-CM | POA: Diagnosis not present

## 2018-12-18 DIAGNOSIS — J449 Chronic obstructive pulmonary disease, unspecified: Secondary | ICD-10-CM | POA: Diagnosis not present

## 2018-12-18 DIAGNOSIS — Z4682 Encounter for fitting and adjustment of non-vascular catheter: Secondary | ICD-10-CM | POA: Diagnosis not present

## 2018-12-18 DIAGNOSIS — L89322 Pressure ulcer of left buttock, stage 2: Secondary | ICD-10-CM | POA: Diagnosis not present

## 2018-12-18 DIAGNOSIS — T8143XA Infection following a procedure, organ and space surgical site, initial encounter: Secondary | ICD-10-CM | POA: Diagnosis not present

## 2018-12-18 DIAGNOSIS — I10 Essential (primary) hypertension: Secondary | ICD-10-CM | POA: Diagnosis not present

## 2018-12-19 ENCOUNTER — Telehealth: Payer: Self-pay

## 2018-12-19 DIAGNOSIS — J9611 Chronic respiratory failure with hypoxia: Secondary | ICD-10-CM | POA: Diagnosis not present

## 2018-12-19 DIAGNOSIS — R4702 Dysphasia: Secondary | ICD-10-CM

## 2018-12-19 DIAGNOSIS — Z4682 Encounter for fitting and adjustment of non-vascular catheter: Secondary | ICD-10-CM | POA: Diagnosis not present

## 2018-12-19 DIAGNOSIS — J869 Pyothorax without fistula: Secondary | ICD-10-CM | POA: Diagnosis not present

## 2018-12-19 DIAGNOSIS — J449 Chronic obstructive pulmonary disease, unspecified: Secondary | ICD-10-CM | POA: Diagnosis not present

## 2018-12-19 DIAGNOSIS — L89322 Pressure ulcer of left buttock, stage 2: Secondary | ICD-10-CM | POA: Diagnosis not present

## 2018-12-19 DIAGNOSIS — I739 Peripheral vascular disease, unspecified: Secondary | ICD-10-CM | POA: Diagnosis not present

## 2018-12-19 DIAGNOSIS — I10 Essential (primary) hypertension: Secondary | ICD-10-CM | POA: Diagnosis not present

## 2018-12-19 DIAGNOSIS — T8143XA Infection following a procedure, organ and space surgical site, initial encounter: Secondary | ICD-10-CM | POA: Diagnosis not present

## 2018-12-19 DIAGNOSIS — I4891 Unspecified atrial fibrillation: Secondary | ICD-10-CM | POA: Diagnosis not present

## 2018-12-19 NOTE — Telephone Encounter (Signed)
Orders sent to [facility] modified barium swallow

## 2018-12-19 NOTE — Telephone Encounter (Signed)
-----   Message from Melrose Nakayama, MD sent at 12/18/2018  4:45 PM EST ----- Regarding: RE: new orders Ok to both  [facility] ----- Message ----- From: Marylen Ponto, LPN Sent: [id number]   2:56 PM EST To: Melrose Nakayama, MD Subject: new orders                                     [facility] Nurse with [facility]) is requesting an order for Modified Barium Swallow test. To be done at [facility].( states they are faster at getting him in)  Also, she is wanting the ok to do bolus feedings via Peg Tube instead of feeding pump to improve his quality of life. Please advise Linden Dolin

## 2018-12-20 ENCOUNTER — Encounter: Payer: Self-pay | Admitting: Thoracic Surgery (Cardiothoracic Vascular Surgery)

## 2018-12-20 ENCOUNTER — Other Ambulatory Visit: Payer: Self-pay

## 2018-12-20 ENCOUNTER — Telehealth: Payer: Self-pay

## 2018-12-20 DIAGNOSIS — I739 Peripheral vascular disease, unspecified: Secondary | ICD-10-CM | POA: Diagnosis not present

## 2018-12-20 DIAGNOSIS — Z4682 Encounter for fitting and adjustment of non-vascular catheter: Secondary | ICD-10-CM | POA: Diagnosis not present

## 2018-12-20 DIAGNOSIS — J449 Chronic obstructive pulmonary disease, unspecified: Secondary | ICD-10-CM | POA: Diagnosis not present

## 2018-12-20 DIAGNOSIS — J869 Pyothorax without fistula: Secondary | ICD-10-CM | POA: Diagnosis not present

## 2018-12-20 DIAGNOSIS — J9611 Chronic respiratory failure with hypoxia: Secondary | ICD-10-CM | POA: Diagnosis not present

## 2018-12-20 DIAGNOSIS — L89322 Pressure ulcer of left buttock, stage 2: Secondary | ICD-10-CM | POA: Diagnosis not present

## 2018-12-20 DIAGNOSIS — I4891 Unspecified atrial fibrillation: Secondary | ICD-10-CM | POA: Diagnosis not present

## 2018-12-20 DIAGNOSIS — I10 Essential (primary) hypertension: Secondary | ICD-10-CM | POA: Diagnosis not present

## 2018-12-20 DIAGNOSIS — T8143XA Infection following a procedure, organ and space surgical site, initial encounter: Secondary | ICD-10-CM | POA: Diagnosis not present

## 2018-12-20 NOTE — Telephone Encounter (Signed)
Pt's wife and home care nurse called the office today reporting that there is increasing redness and yellowish-green drainage around his chest tube/drain site. He is afebrile. Scheduled an appointment to have this evaluated by a PA tomorrow.

## 2018-12-21 ENCOUNTER — Other Ambulatory Visit: Payer: Self-pay | Admitting: Thoracic Surgery (Cardiothoracic Vascular Surgery)

## 2018-12-21 ENCOUNTER — Other Ambulatory Visit: Payer: Self-pay

## 2018-12-21 ENCOUNTER — Ambulatory Visit (INDEPENDENT_AMBULATORY_CARE_PROVIDER_SITE_OTHER): Payer: Self-pay | Admitting: Physician Assistant

## 2018-12-21 VITALS — BP 103/59 | HR 67 | Temp 97.5°F | Resp 16

## 2018-12-21 DIAGNOSIS — I739 Peripheral vascular disease, unspecified: Secondary | ICD-10-CM | POA: Diagnosis not present

## 2018-12-21 DIAGNOSIS — J869 Pyothorax without fistula: Secondary | ICD-10-CM | POA: Diagnosis not present

## 2018-12-21 DIAGNOSIS — Z5189 Encounter for other specified aftercare: Secondary | ICD-10-CM

## 2018-12-21 DIAGNOSIS — Z4682 Encounter for fitting and adjustment of non-vascular catheter: Secondary | ICD-10-CM | POA: Diagnosis not present

## 2018-12-21 DIAGNOSIS — T8143XA Infection following a procedure, organ and space surgical site, initial encounter: Secondary | ICD-10-CM | POA: Diagnosis not present

## 2018-12-21 DIAGNOSIS — I10 Essential (primary) hypertension: Secondary | ICD-10-CM | POA: Diagnosis not present

## 2018-12-21 DIAGNOSIS — J449 Chronic obstructive pulmonary disease, unspecified: Secondary | ICD-10-CM | POA: Diagnosis not present

## 2018-12-21 DIAGNOSIS — L89322 Pressure ulcer of left buttock, stage 2: Secondary | ICD-10-CM | POA: Diagnosis not present

## 2018-12-21 DIAGNOSIS — J9611 Chronic respiratory failure with hypoxia: Secondary | ICD-10-CM | POA: Diagnosis not present

## 2018-12-21 DIAGNOSIS — I4891 Unspecified atrial fibrillation: Secondary | ICD-10-CM | POA: Diagnosis not present

## 2018-12-21 MED ORDER — CEPHALEXIN 250 MG/5ML PO SUSR: 500.0000 mg | Freq: Three times a day (TID) | ORAL | 0 refills | Status: DC

## 2018-12-21 NOTE — Progress Notes (Signed)
HPI:  Patient returns for wound check.  He is S/P Right VATS with right lower lobectomy performed in September.  He was found to have empyema at his post operative visit.  He required admission to the hospital and underwent repeat Right Vats with drainage of empyema and decortication.  His hospital stay was complicated by right hydropneumothorax.  He was discharged with a chest tube in place.  He also had issues with dysphagia and required tube feedings at discharge as well.  The patient presents today with complaints of redness and drainage from his chest tube site.  He is accompanied by his wife who states this happened before.  He denies fever, chills, sweats.  Current Outpatient Medications  Medication Sig Dispense Refill  . albuterol (VENTOLIN HFA) 108 (90 Base) MCG/ACT inhaler Inhale 2 puffs into the lungs every 4 (four) hours as needed for wheezing or shortness of breath.    . Amino Acids-Protein Hydrolys (FEEDING SUPPLEMENT, PRO-STAT SUGAR FREE 64,) LIQD Place 30 mLs into feeding tube 2 (two) times daily. 887 mL 0  . atorvastatin (LIPITOR) 20 MG tablet Place 1 tablet (20 mg total) into feeding tube at bedtime. 30 tablet 1  . budesonide-formoterol (SYMBICORT) 160-4.5 MCG/ACT inhaler Inhale 2 puffs into the lungs 2 (two) times daily.    . diclofenac sodium (VOLTAREN) 1 % GEL Apply 2 g topically 4 (four) times daily as needed (pain).     Marland Kitchen docusate (COLACE) 50 MG/5ML liquid Place 10 mLs (100 mg total) into feeding tube at bedtime. (Patient taking differently: Place 100 mg into feeding tube at bedtime as needed for mild constipation. ) 123XX123 mL 0  . folic acid (FOLVITE) 1 MG tablet Place 1 tablet (1 mg total) into feeding tube daily. 30 tablet 1  . gabapentin (NEURONTIN) 250 MG/5ML solution Place 6 mLs (300 mg total) into feeding tube at bedtime. 180 mL 0  . levalbuterol (XOPENEX) 0.63 MG/3ML nebulizer solution Take 3 mLs (0.63 mg total) by nebulization every 4 (four) hours as needed for wheezing or  shortness of breath. 3 mL 12  . lidocaine (LIDODERM) 5 % Place 1 patch onto the skin daily as needed (pain).     . metoprolol tartrate (LOPRESSOR) 25 MG tablet Place 12.5 mg into feeding tube daily. CRUSH MED    . Multiple Vitamin (MULTIVITAMIN) LIQD Place 15 mLs into feeding tube daily. 450 mL 0  . Nutritional Supplements (FEEDING SUPPLEMENT, OSMOLITE 1.5 CAL,) LIQD Place 1,000 mLs into feeding tube continuous. 1000 mL 30  . sertraline (ZOLOFT) 100 MG tablet Place 2 tablets (200 mg total) into feeding tube daily. 30 tablet 0  . Tiotropium Bromide Monohydrate (SPIRIVA RESPIMAT) 2.5 MCG/ACT AERS Inhale 2 puffs into the lungs daily.    . traZODone (DESYREL) 50 MG tablet Place 1 tablet (50 mg total) into feeding tube at bedtime. 30 tablet 1  . vitamin B-12 1000 MCG tablet Place 1 tablet (1,000 mcg total) into feeding tube daily. 30 tablet 1  . Water For Irrigation, Sterile (FREE WATER) SOLN Place 200 mLs into feeding tube 4 (four) times daily. 24000 mL 0   No current facility-administered medications for this visit.     Physical Exam:  BP (!) 103/59 (BP Location: Right Arm)   Pulse 67   Temp (!) 97.5 F (36.4 C)   Resp 16   SpO2 94% Comment: RA  Gen: No apparent distress Heart: RRR Lungs: diminished on right Incision: chest tube site is red, mildly purulent drainage present, empyema tube  in place  A/P:  1. Right chest tube site irritation- this is due to longstanding suture that has been in place, would is mildly erythematous with mild purulence noted.  I removed the suture, cleaned the area, placed a new stay suture and re-safety pinned the Empyema tube 2. Will give a short course of ABX... Keflex 250mg /76ml per tube, give 10 ml TID for 7 days 3. Patient is schedule to see Dr. Roxan Hockey this Tuesday with CXR  Ellwood Handler, PA-C [facility] [phone]

## 2018-12-23 DIAGNOSIS — E871 Hypo-osmolality and hyponatremia: Secondary | ICD-10-CM | POA: Diagnosis not present

## 2018-12-23 DIAGNOSIS — E43 Unspecified severe protein-calorie malnutrition: Secondary | ICD-10-CM | POA: Diagnosis not present

## 2018-12-24 DIAGNOSIS — L89322 Pressure ulcer of left buttock, stage 2: Secondary | ICD-10-CM | POA: Diagnosis not present

## 2018-12-24 DIAGNOSIS — I739 Peripheral vascular disease, unspecified: Secondary | ICD-10-CM | POA: Diagnosis not present

## 2018-12-24 DIAGNOSIS — T8143XA Infection following a procedure, organ and space surgical site, initial encounter: Secondary | ICD-10-CM | POA: Diagnosis not present

## 2018-12-24 DIAGNOSIS — J9611 Chronic respiratory failure with hypoxia: Secondary | ICD-10-CM | POA: Diagnosis not present

## 2018-12-24 DIAGNOSIS — I4891 Unspecified atrial fibrillation: Secondary | ICD-10-CM | POA: Diagnosis not present

## 2018-12-24 DIAGNOSIS — J869 Pyothorax without fistula: Secondary | ICD-10-CM | POA: Diagnosis not present

## 2018-12-24 DIAGNOSIS — Z4682 Encounter for fitting and adjustment of non-vascular catheter: Secondary | ICD-10-CM | POA: Diagnosis not present

## 2018-12-24 DIAGNOSIS — I10 Essential (primary) hypertension: Secondary | ICD-10-CM | POA: Diagnosis not present

## 2018-12-24 DIAGNOSIS — J449 Chronic obstructive pulmonary disease, unspecified: Secondary | ICD-10-CM | POA: Diagnosis not present

## 2018-12-25 ENCOUNTER — Other Ambulatory Visit: Payer: Self-pay | Admitting: Thoracic Surgery (Cardiothoracic Vascular Surgery)

## 2018-12-25 ENCOUNTER — Ambulatory Visit (INDEPENDENT_AMBULATORY_CARE_PROVIDER_SITE_OTHER): Payer: Self-pay | Admitting: Thoracic Surgery (Cardiothoracic Vascular Surgery)

## 2018-12-25 ENCOUNTER — Other Ambulatory Visit: Payer: Self-pay

## 2018-12-25 ENCOUNTER — Ambulatory Visit
Admission: RE | Admit: 2018-12-25 | Discharge: 2018-12-25 | Disposition: A | Discharge status: Home / Self Care | Payer: No Typology Code available for payment source | Source: Ambulatory Visit | Attending: Thoracic Surgery (Cardiothoracic Vascular Surgery) | Admitting: Thoracic Surgery (Cardiothoracic Vascular Surgery)

## 2018-12-25 VITALS — BP 120/72 | HR 71 | Temp 97.7°F | Resp 20 | Ht 70.0 in | Wt 131.0 lb

## 2018-12-25 DIAGNOSIS — Z5189 Encounter for other specified aftercare: Secondary | ICD-10-CM

## 2018-12-25 DIAGNOSIS — J869 Pyothorax without fistula: Secondary | ICD-10-CM

## 2018-12-25 DIAGNOSIS — Z902 Acquired absence of lung [part of]: Secondary | ICD-10-CM

## 2018-12-25 DIAGNOSIS — Z9689 Presence of other specified functional implants: Secondary | ICD-10-CM

## 2018-12-25 DIAGNOSIS — J439 Emphysema, unspecified: Secondary | ICD-10-CM | POA: Diagnosis not present

## 2018-12-25 NOTE — Progress Notes (Signed)
[address]       [place] [address]             [phone]      HPI: William Kirk returns for scheduled follow-up visit  William Kirk is a 78 year old man with a past history of tobacco abuse, COPD, bronchiectasis, recurrent pneumonia, recurrent hemoptysis, peripheral arterial disease, reflux, severe protein calorie malnutrition, and depression.  He had a thoracoscopic right lower lobectomy on 10/18/2018.  Initially did well but presented back with an empyema.  He required a redo VATS to drain the empyema and decorticate the lung.  Unfortunately his lung would not completely reexpand.  Cultures grew Klebsiella and Streptococcus constellatus.  He went home with a tube in place.  He completed his antibiotic course at home.  He was found to be aspirating while in the hospital.  He went home with speech therapy.  He was also sent home with a core track feeding tube.  Since he was unable to start eating, I had IR place a PEG tube about 10 days ago.  Today he is in good spirits.  He was seen in the office last week with redness around his incision.  The incision is just about work through the skin.  A new suture was placed and he was treated with Keflex.  He and his wife both say that he has had more energy.  He is feeling stronger.  He thinks he is making good progress with speech therapy.  Past Medical History:  Diagnosis Date  . Anxiety   . Arthritis    "hands" (09/21/2016)  . Bleeding stomach ulcer 2000s  . COPD (chronic obstructive pulmonary disease) ([place])   . Cough   . Depression   . Dyspnea   . Hypertension   . Pneumonia    "now and several times before this" (09/21/2016)  . PVD (peripheral vascular disease) ([place]) 2014   prev PTCA on RLE    Current Outpatient Medications  Medication Sig Dispense Refill  . albuterol (VENTOLIN HFA) 108 (90 Base) MCG/ACT inhaler Inhale 2 puffs into the lungs every 4 (four) hours as needed for wheezing or shortness of breath.     . Amino Acids-Protein Hydrolys (FEEDING SUPPLEMENT, PRO-STAT SUGAR FREE 64,) LIQD Place 30 mLs into feeding tube 2 (two) times daily. 887 mL 0  . atorvastatin (LIPITOR) 20 MG tablet Place 1 tablet (20 mg total) into feeding tube at bedtime. 30 tablet 1  . budesonide-formoterol (SYMBICORT) 160-4.5 MCG/ACT inhaler Inhale 2 puffs into the lungs 2 (two) times daily.    . cephALEXin (KEFLEX) 250 MG/5ML suspension Take 10 mLs (500 mg total) by mouth 3 (three) times daily. 210 mL 0  . diclofenac sodium (VOLTAREN) 1 % GEL Apply 2 g topically 4 (four) times daily as needed (pain).     Marland Kitchen docusate (COLACE) 50 MG/5ML liquid Place 10 mLs (100 mg total) into feeding tube at bedtime. (Patient taking differently: Place 100 mg into feeding tube at bedtime as needed for mild constipation. ) 123XX123 mL 0  . folic acid (FOLVITE) 1 MG tablet Place 1 tablet (1 mg total) into feeding tube daily. 30 tablet 1  . levalbuterol (XOPENEX) 0.63 MG/3ML nebulizer solution Take 3 mLs (0.63 mg total) by nebulization every 4 (four) hours as needed for wheezing or shortness of breath. 3 mL 12  . lidocaine (LIDODERM) 5 % Place 1 patch onto the skin daily as needed (pain).     . metoprolol  tartrate (LOPRESSOR) 25 MG tablet Place 12.5 mg into feeding tube daily. CRUSH MED    . sertraline (ZOLOFT) 100 MG tablet Place 2 tablets (200 mg total) into feeding tube daily. 30 tablet 0  . Tiotropium Bromide Monohydrate (SPIRIVA RESPIMAT) 2.5 MCG/ACT AERS Inhale 2 puffs into the lungs daily.    . traZODone (DESYREL) 50 MG tablet Place 1 tablet (50 mg total) into feeding tube at bedtime. 30 tablet 1  . vitamin B-12 1000 MCG tablet Place 1 tablet (1,000 mcg total) into feeding tube daily. 30 tablet 1  . gabapentin (NEURONTIN) 250 MG/5ML solution Place 6 mLs (300 mg total) into feeding tube at bedtime. 180 mL 0   No current facility-administered medications for this visit.     Physical Exam BP 120/72   Pulse 71   Temp 97.7 F (36.5 C) (Skin)    Resp 20   Ht 5\' 10"  (1.778 m)   Wt 131 lb (59.4 kg)   SpO2 96% Comment: RA  BMI 18.30 kg/m  78 year old man in no acute distress Alert and oriented x3 with no focal deficits Lungs diminished at right base Empyema tube in place with purulent drainage  Diagnostic Tests: CHEST - 2 VIEW  COMPARISON:  Radiograph 12/11/2018  FINDINGS: Stable appearance of a complex right empyema with right chest tube in place positioned more superiorly and posteriorly than on comparison study. There are postsurgical changes in the right lung with associated volume loss and rightward mediastinal shift. Trace left effusion is present though the remaining portions of the left lung are fairly well aerated aside from some scarring and architectural distortion in the apex and lower lobe. No new consolidative opacities are seen.  IMPRESSION: 1. Stable appearance of a complex right empyema with right chest tube in place more superiorly and posteriorly than on comparison study. 2. No new consolidative opacities. 3. Trace left effusion. 4. Postsurgical changes and volume loss in the right hemithorax with stable rightward mediastinal shift.   Electronically Signed   By: Lovena Le M.D.   On: 12/25/2018 14:26 I personally reviewed the chest x-ray images and concur with the findings noted above  Impression: William Kirk is a 78 year old manwith a past history of tobacco abuse, COPD, bronchiectasis, recurrent pneumonia, recurrent hemoptysis, peripheral arterial disease, reflux, severe protein calorie malnutrition, and depression.  He had a right lower lobectomy for recurrent massive hemoptysis.  He developed an empyema postoperatively.  The lung would not reexpand to fill the space so he went home with an empyema tube in place.  He continues to have some purulent drainage.  I think his chest x-ray actually shows a smaller space than his previous film.  This is more apparent on the lateral view.  He  does still have some purulent drainage so I did not advance his chest tube today.  We will consider pulling it back a little bit next week.  Aspiration-he is working with speech therapy.  He has a PEG tube in place.  His wife was not sure exactly how to go from continuous feedings to bolus feedings.  I explained that she could do 250 mL over an hour 4 times a day.  If he tolerates that then she can do it over about half an hour at a time.  Plan: Return in 1 week with PA lateral chest x-ray  Melrose Nakayama, MD [facility] [phone]

## 2018-12-26 ENCOUNTER — Other Ambulatory Visit: Payer: Self-pay | Admitting: Thoracic Surgery (Cardiothoracic Vascular Surgery)

## 2018-12-26 DIAGNOSIS — I4891 Unspecified atrial fibrillation: Secondary | ICD-10-CM | POA: Diagnosis not present

## 2018-12-26 DIAGNOSIS — J9611 Chronic respiratory failure with hypoxia: Secondary | ICD-10-CM | POA: Diagnosis not present

## 2018-12-26 DIAGNOSIS — L89322 Pressure ulcer of left buttock, stage 2: Secondary | ICD-10-CM | POA: Diagnosis not present

## 2018-12-26 DIAGNOSIS — I10 Essential (primary) hypertension: Secondary | ICD-10-CM | POA: Diagnosis not present

## 2018-12-26 DIAGNOSIS — J869 Pyothorax without fistula: Secondary | ICD-10-CM | POA: Diagnosis not present

## 2018-12-26 DIAGNOSIS — Z4682 Encounter for fitting and adjustment of non-vascular catheter: Secondary | ICD-10-CM | POA: Diagnosis not present

## 2018-12-26 DIAGNOSIS — R1310 Dysphagia, unspecified: Secondary | ICD-10-CM

## 2018-12-26 DIAGNOSIS — I739 Peripheral vascular disease, unspecified: Secondary | ICD-10-CM | POA: Diagnosis not present

## 2018-12-26 DIAGNOSIS — J449 Chronic obstructive pulmonary disease, unspecified: Secondary | ICD-10-CM | POA: Diagnosis not present

## 2018-12-26 DIAGNOSIS — T8143XA Infection following a procedure, organ and space surgical site, initial encounter: Secondary | ICD-10-CM | POA: Diagnosis not present

## 2018-12-27 DIAGNOSIS — J869 Pyothorax without fistula: Secondary | ICD-10-CM | POA: Diagnosis not present

## 2018-12-27 DIAGNOSIS — T8143XA Infection following a procedure, organ and space surgical site, initial encounter: Secondary | ICD-10-CM | POA: Diagnosis not present

## 2018-12-27 DIAGNOSIS — J9611 Chronic respiratory failure with hypoxia: Secondary | ICD-10-CM | POA: Diagnosis not present

## 2018-12-27 DIAGNOSIS — I739 Peripheral vascular disease, unspecified: Secondary | ICD-10-CM | POA: Diagnosis not present

## 2018-12-27 DIAGNOSIS — L89322 Pressure ulcer of left buttock, stage 2: Secondary | ICD-10-CM | POA: Diagnosis not present

## 2018-12-27 DIAGNOSIS — Z4682 Encounter for fitting and adjustment of non-vascular catheter: Secondary | ICD-10-CM | POA: Diagnosis not present

## 2018-12-27 DIAGNOSIS — I10 Essential (primary) hypertension: Secondary | ICD-10-CM | POA: Diagnosis not present

## 2018-12-27 DIAGNOSIS — I4891 Unspecified atrial fibrillation: Secondary | ICD-10-CM | POA: Diagnosis not present

## 2018-12-27 DIAGNOSIS — J449 Chronic obstructive pulmonary disease, unspecified: Secondary | ICD-10-CM | POA: Diagnosis not present

## 2018-12-31 ENCOUNTER — Ambulatory Visit (HOSPITAL_COMMUNITY): Admission: RE | Admit: 2018-12-31 | Payer: No Typology Code available for payment source | Source: Ambulatory Visit

## 2018-12-31 ENCOUNTER — Encounter (HOSPITAL_COMMUNITY): Payer: Self-pay

## 2018-12-31 ENCOUNTER — Other Ambulatory Visit: Payer: Self-pay | Admitting: Thoracic Surgery (Cardiothoracic Vascular Surgery)

## 2018-12-31 DIAGNOSIS — T8143XA Infection following a procedure, organ and space surgical site, initial encounter: Secondary | ICD-10-CM | POA: Diagnosis not present

## 2018-12-31 DIAGNOSIS — I4891 Unspecified atrial fibrillation: Secondary | ICD-10-CM | POA: Diagnosis not present

## 2018-12-31 DIAGNOSIS — J9611 Chronic respiratory failure with hypoxia: Secondary | ICD-10-CM | POA: Diagnosis not present

## 2018-12-31 DIAGNOSIS — I739 Peripheral vascular disease, unspecified: Secondary | ICD-10-CM | POA: Diagnosis not present

## 2018-12-31 DIAGNOSIS — J869 Pyothorax without fistula: Secondary | ICD-10-CM | POA: Diagnosis not present

## 2018-12-31 DIAGNOSIS — I10 Essential (primary) hypertension: Secondary | ICD-10-CM | POA: Diagnosis not present

## 2018-12-31 DIAGNOSIS — L89322 Pressure ulcer of left buttock, stage 2: Secondary | ICD-10-CM | POA: Diagnosis not present

## 2018-12-31 DIAGNOSIS — Z4682 Encounter for fitting and adjustment of non-vascular catheter: Secondary | ICD-10-CM | POA: Diagnosis not present

## 2018-12-31 DIAGNOSIS — J449 Chronic obstructive pulmonary disease, unspecified: Secondary | ICD-10-CM | POA: Diagnosis not present

## 2019-01-01 ENCOUNTER — Other Ambulatory Visit: Payer: Self-pay

## 2019-01-01 ENCOUNTER — Other Ambulatory Visit: Payer: Self-pay | Admitting: Thoracic Surgery (Cardiothoracic Vascular Surgery)

## 2019-01-01 ENCOUNTER — Encounter: Payer: Self-pay | Admitting: Thoracic Surgery (Cardiothoracic Vascular Surgery)

## 2019-01-01 ENCOUNTER — Ambulatory Visit
Admission: RE | Admit: 2019-01-01 | Discharge: 2019-01-01 | Disposition: A | Discharge status: Home / Self Care | Payer: No Typology Code available for payment source | Source: Ambulatory Visit | Attending: Thoracic Surgery (Cardiothoracic Vascular Surgery) | Admitting: Thoracic Surgery (Cardiothoracic Vascular Surgery)

## 2019-01-01 ENCOUNTER — Ambulatory Visit (INDEPENDENT_AMBULATORY_CARE_PROVIDER_SITE_OTHER): Payer: Self-pay | Admitting: Thoracic Surgery (Cardiothoracic Vascular Surgery)

## 2019-01-01 VITALS — BP 115/67 | HR 62 | Temp 97.7°F | Resp 20 | Wt 136.0 lb

## 2019-01-01 DIAGNOSIS — J869 Pyothorax without fistula: Secondary | ICD-10-CM

## 2019-01-01 DIAGNOSIS — R1319 Other dysphagia: Secondary | ICD-10-CM

## 2019-01-01 DIAGNOSIS — J439 Emphysema, unspecified: Secondary | ICD-10-CM | POA: Diagnosis not present

## 2019-01-01 DIAGNOSIS — J939 Pneumothorax, unspecified: Secondary | ICD-10-CM | POA: Diagnosis not present

## 2019-01-01 NOTE — Progress Notes (Signed)
[address]       [place] [address]             [phone]       HPI: William Kirk returns for a scheduled follow-up visit  He feels much better over the past 2 weeks.  He continues to drain from his chest tube, but the amount of drainage has gone down according to his wife.  He is still taking feedings through his PEG tube.  He has been working with speech pathology and is now able to swallow ice without any signs of aspiration.  We had scheduled him for a barium swallow to see if we could let him start eating.  Unfortunately, there was some miscommunication on our part and the test was canceled for some reason that is not clear to me.  He has not been having any fevers or chills.  Past Medical History:  Diagnosis Date  . Anxiety   . Arthritis    "hands" (09/21/2016)  . Bleeding stomach ulcer 2000s  . COPD (chronic obstructive pulmonary disease) ([place])   . Cough   . Depression   . Dyspnea   . Hypertension   . Pneumonia    "now and several times before this" (09/21/2016)  . PVD (peripheral vascular disease) ([place]) 2014   prev PTCA on RLE       Current Outpatient Medications  Medication Sig Dispense Refill  . albuterol (VENTOLIN HFA) 108 (90 Base) MCG/ACT inhaler Inhale 2 puffs into the lungs every 4 (four) hours as needed for wheezing or shortness of breath.    . Amino Acids-Protein Hydrolys (FEEDING SUPPLEMENT, PRO-STAT SUGAR FREE 64,) LIQD Place 30 mLs into feeding tube 2 (two) times daily. 887 mL 0  . atorvastatin (LIPITOR) 20 MG tablet Place 1 tablet (20 mg total) into feeding tube at bedtime. 30 tablet 1  . budesonide-formoterol (SYMBICORT) 160-4.5 MCG/ACT inhaler Inhale 2 puffs into the lungs 2 (two) times daily.    . cephALEXin (KEFLEX) 250 MG/5ML suspension Take 10 mLs (500 mg total) by mouth 3 (three) times daily. 210 mL 0  . diclofenac sodium (VOLTAREN) 1 % GEL Apply 2 g topically 4 (four) times daily as needed (pain).     Marland Kitchen docusate (COLACE)  50 MG/5ML liquid Place 10 mLs (100 mg total) into feeding tube at bedtime. (Patient taking differently: Place 100 mg into feeding tube at bedtime as needed for mild constipation. ) 123XX123 mL 0  . folic acid (FOLVITE) 1 MG tablet Place 1 tablet (1 mg total) into feeding tube daily. 30 tablet 1  . levalbuterol (XOPENEX) 0.63 MG/3ML nebulizer solution Take 3 mLs (0.63 mg total) by nebulization every 4 (four) hours as needed for wheezing or shortness of breath. 3 mL 12  . lidocaine (LIDODERM) 5 % Place 1 patch onto the skin daily as needed (pain).     . metoprolol tartrate (LOPRESSOR) 25 MG tablet Place 12.5 mg into feeding tube daily. CRUSH MED    . sertraline (ZOLOFT) 100 MG tablet Place 2 tablets (200 mg total) into feeding tube daily. 30 tablet 0  . Tiotropium Bromide Monohydrate (SPIRIVA RESPIMAT) 2.5 MCG/ACT AERS Inhale 2 puffs into the lungs daily.    . traZODone (DESYREL) 50 MG tablet Place 1 tablet (50 mg total) into feeding tube at bedtime. 30 tablet 1  . vitamin B-12 1000 MCG tablet Place 1 tablet (1,000 mcg total) into feeding tube daily. 30 tablet 1  . gabapentin (NEURONTIN)  250 MG/5ML solution Place 6 mLs (300 mg total) into feeding tube at bedtime. 180 mL 0   No current facility-administered medications for this visit.     Physical Exam BP 115/67 (BP Location: Right Arm)   Pulse 62   Temp 97.7 F (36.5 C) (Skin)   Resp 20   Wt 136 lb (61.7 kg)   SpO2 94% Comment: RA  BMI 19.18 kg/m  78 year old man in no acute distress Alert and oriented x3 Diminished breath sounds right base Tube site with local erythema, small amount of brownish fluid in ostomy bag Incision well-healed  Diagnostic Tests: CHEST - 2 VIEW  COMPARISON:  12/25/2018  FINDINGS: Right chest tube in similar position. Unchanged pleural thickening/fluid and small lower lobe pneumothorax which is loculated posteriorly. Stable hyperinflated left lung with mild scarring. Mediastinal shift to the right in the  setting of volume loss. Stable heart size  IMPRESSION: Stable loculated hydropneumothorax at the posterior right base. The chest tube remains in stable position in close proximity to the gas.   Electronically Signed   By: Monte Fantasia M.D.   On: 01/01/2019 08:56 I personally reviewed the chest x-ray images and concur with the findings noted above, although I think the posterior airspace component may be slightly smaller on today's film.  Impression: William Kirk is a 78 year old gentleman with a history of recurrent pneumonia in the setting of bronchiectasis and recurrent massive hemoptysis.  He also has severe protein calorie malnutrition.  I did a right lower lobectomy in September.  He came back about 3 weeks later with an empyema.  He ended up having a space after decortication and went home with an empyema tube.  We have been slowly advancing that out.  Empyema tube was advanced out 2 cm and resecured.  I am not sure what happened with the swallowing eval.  It does not matter to me what type of test he has to clear his swallowing.  He is anxious to begin taking p.o.'s.  Any test that can confirm that he is not aspirating is fine by me.  We will contact the speech pathologist and see exactly what test they would like [place] to order, or if one is even needed.  Plan: Return in 1 week  Melrose Nakayama, MD [facility] [phone]

## 2019-01-02 DIAGNOSIS — J9611 Chronic respiratory failure with hypoxia: Secondary | ICD-10-CM | POA: Diagnosis not present

## 2019-01-02 DIAGNOSIS — Z4682 Encounter for fitting and adjustment of non-vascular catheter: Secondary | ICD-10-CM | POA: Diagnosis not present

## 2019-01-02 DIAGNOSIS — I739 Peripheral vascular disease, unspecified: Secondary | ICD-10-CM | POA: Diagnosis not present

## 2019-01-02 DIAGNOSIS — I4891 Unspecified atrial fibrillation: Secondary | ICD-10-CM | POA: Diagnosis not present

## 2019-01-02 DIAGNOSIS — L89322 Pressure ulcer of left buttock, stage 2: Secondary | ICD-10-CM | POA: Diagnosis not present

## 2019-01-02 DIAGNOSIS — J869 Pyothorax without fistula: Secondary | ICD-10-CM | POA: Diagnosis not present

## 2019-01-02 DIAGNOSIS — J449 Chronic obstructive pulmonary disease, unspecified: Secondary | ICD-10-CM | POA: Diagnosis not present

## 2019-01-02 DIAGNOSIS — I10 Essential (primary) hypertension: Secondary | ICD-10-CM | POA: Diagnosis not present

## 2019-01-02 DIAGNOSIS — T8143XA Infection following a procedure, organ and space surgical site, initial encounter: Secondary | ICD-10-CM | POA: Diagnosis not present

## 2019-01-04 DIAGNOSIS — J9611 Chronic respiratory failure with hypoxia: Secondary | ICD-10-CM | POA: Diagnosis not present

## 2019-01-04 DIAGNOSIS — I739 Peripheral vascular disease, unspecified: Secondary | ICD-10-CM | POA: Diagnosis not present

## 2019-01-04 DIAGNOSIS — Z4682 Encounter for fitting and adjustment of non-vascular catheter: Secondary | ICD-10-CM | POA: Diagnosis not present

## 2019-01-04 DIAGNOSIS — L89322 Pressure ulcer of left buttock, stage 2: Secondary | ICD-10-CM | POA: Diagnosis not present

## 2019-01-04 DIAGNOSIS — T8143XA Infection following a procedure, organ and space surgical site, initial encounter: Secondary | ICD-10-CM | POA: Diagnosis not present

## 2019-01-04 DIAGNOSIS — J449 Chronic obstructive pulmonary disease, unspecified: Secondary | ICD-10-CM | POA: Diagnosis not present

## 2019-01-04 DIAGNOSIS — I4891 Unspecified atrial fibrillation: Secondary | ICD-10-CM | POA: Diagnosis not present

## 2019-01-04 DIAGNOSIS — J869 Pyothorax without fistula: Secondary | ICD-10-CM | POA: Diagnosis not present

## 2019-01-04 DIAGNOSIS — I10 Essential (primary) hypertension: Secondary | ICD-10-CM | POA: Diagnosis not present

## 2019-01-07 ENCOUNTER — Other Ambulatory Visit (HOSPITAL_COMMUNITY): Payer: Self-pay | Admitting: Thoracic Surgery (Cardiothoracic Vascular Surgery)

## 2019-01-07 ENCOUNTER — Other Ambulatory Visit: Payer: Self-pay

## 2019-01-07 ENCOUNTER — Ambulatory Visit (HOSPITAL_COMMUNITY)
Admission: RE | Admit: 2019-01-07 | Discharge: 2019-01-07 | Disposition: A | Discharge status: Home / Self Care | Payer: No Typology Code available for payment source | Source: Ambulatory Visit | Attending: Thoracic Surgery (Cardiothoracic Vascular Surgery) | Admitting: Thoracic Surgery (Cardiothoracic Vascular Surgery)

## 2019-01-07 ENCOUNTER — Other Ambulatory Visit (HOSPITAL_COMMUNITY): Payer: Self-pay | Admitting: Orthopaedic Surgery

## 2019-01-07 ENCOUNTER — Other Ambulatory Visit: Payer: Self-pay | Admitting: Thoracic Surgery (Cardiothoracic Vascular Surgery)

## 2019-01-07 ENCOUNTER — Encounter (HOSPITAL_COMMUNITY): Payer: No Typology Code available for payment source

## 2019-01-07 DIAGNOSIS — I10 Essential (primary) hypertension: Secondary | ICD-10-CM | POA: Diagnosis not present

## 2019-01-07 DIAGNOSIS — L89322 Pressure ulcer of left buttock, stage 2: Secondary | ICD-10-CM | POA: Diagnosis not present

## 2019-01-07 DIAGNOSIS — Z4682 Encounter for fitting and adjustment of non-vascular catheter: Secondary | ICD-10-CM | POA: Diagnosis not present

## 2019-01-07 DIAGNOSIS — R1319 Other dysphagia: Secondary | ICD-10-CM

## 2019-01-07 DIAGNOSIS — T8143XA Infection following a procedure, organ and space surgical site, initial encounter: Secondary | ICD-10-CM | POA: Diagnosis not present

## 2019-01-07 DIAGNOSIS — I739 Peripheral vascular disease, unspecified: Secondary | ICD-10-CM | POA: Diagnosis not present

## 2019-01-07 DIAGNOSIS — J449 Chronic obstructive pulmonary disease, unspecified: Secondary | ICD-10-CM | POA: Diagnosis not present

## 2019-01-07 DIAGNOSIS — R1310 Dysphagia, unspecified: Secondary | ICD-10-CM | POA: Diagnosis not present

## 2019-01-07 DIAGNOSIS — J869 Pyothorax without fistula: Secondary | ICD-10-CM | POA: Diagnosis not present

## 2019-01-07 DIAGNOSIS — J9611 Chronic respiratory failure with hypoxia: Secondary | ICD-10-CM | POA: Diagnosis not present

## 2019-01-07 DIAGNOSIS — I4891 Unspecified atrial fibrillation: Secondary | ICD-10-CM | POA: Diagnosis not present

## 2019-01-07 NOTE — Therapy (Signed)
Modified Barium Swallow Progress Note  Patient Details  Name: William Kirk MRN: [id number] Date of Birth: 1940/05/08  Today's Date: 01/07/2019  Modified Barium Swallow completed.  Full report located under Chart Review in the Imaging Section.  Brief recommendations include the following:  Clinical Impression Pt presents with moderate-severe pharyngeal dysphagia with both sensory and motor deficits.  Swallow reflex was triggered at the level of the vallecular sinus, and epiglottic inversion was noted to be sluggish across consistencies. Penetration of all consistencies (nectar thick, honey thick, puree) was seen during the swallow. Aspiration of nectar thick liquid resulted in a delayed ineffective reflexive cough.   CHIN TUCK position was effective to reduce or eliminate penetration and aspiration. Honey thick liquids via teaspoon and small boluses of puree texture were tolerated without penetration when pt utilized chin tuck. Nectar thick liquids exhibited flash penetration with chin tuck position. Vallecular residue was seen post-swallow across consistencies, which clears with multiple swallows. Pt exhibited wet voice quality after this study, raising concern for the risk of aspiration of post-swallow residue. Pt continues to be at significantly high risk of aspiration if chin tuck position is not utilized during po intake.  Recommend primary nutrition, hydration, and medication to continue via PEG tube. Pt may begin PO intake for oral satisfaction and comfort, and during dysphagia therapy using honey thick liquids via teaspoon and puree textures with CHIN TUCK position for all po intake. Recommend continuing with home health ST. Will defer to primary therapist for plan of care and recommendations for follow up MBS. Safe swallow precautions were reviewed with pt, and provided in written form.      Swallow Evaluation Recommendations PEG tube for primary nutrition, hydration, and  medication   SLP Diet Recommendations: Dysphagia 1 (Puree) solids;Honey thick liquids - CHIN TUCK for ALL PO INTAKE   Liquid Administration via: Spoon   Medication Administration: Via alternative means   Supervision: Patient able to self feed;Staff to assist with self feeding;Full supervision/cueing for compensatory strategies   Compensations: Minimize environmental distractions;Slow rate;Small sips/bites;Clear throat intermittently;Effortful swallow;Chin tuck   Postural Changes: Remain semi-upright after after feeds/meals (Comment);Seated upright at 90 degrees   Oral Care Recommendations: Oral care QID      Enriqueta Shutter, [facility] [place] Pathologist Office: [phone] Pager: [phone]  Shonna Chock 01/07/2019,1:16 PM

## 2019-01-08 ENCOUNTER — Other Ambulatory Visit: Payer: Self-pay

## 2019-01-08 ENCOUNTER — Ambulatory Visit (INDEPENDENT_AMBULATORY_CARE_PROVIDER_SITE_OTHER): Payer: Self-pay | Admitting: Thoracic Surgery (Cardiothoracic Vascular Surgery)

## 2019-01-08 VITALS — BP 113/62 | HR 70 | Temp 97.7°F | Resp 20 | Ht 70.0 in | Wt 137.0 lb

## 2019-01-08 DIAGNOSIS — Z9689 Presence of other specified functional implants: Secondary | ICD-10-CM

## 2019-01-08 DIAGNOSIS — Z902 Acquired absence of lung [part of]: Secondary | ICD-10-CM

## 2019-01-08 DIAGNOSIS — R1310 Dysphagia, unspecified: Secondary | ICD-10-CM

## 2019-01-08 DIAGNOSIS — J869 Pyothorax without fistula: Secondary | ICD-10-CM

## 2019-01-08 NOTE — Progress Notes (Signed)
[address]       ,[place] [address]             [phone]      HPI: William Kirk returns for follow-up  William Kirk is a 78 year old man with a history of tobacco abuse, COPD, bronchiectasis, recurrent pneumonia, recurrent hemoptysis, aspiration, reflux, severe protein calorie malnutrition, and depression.  I did a thoracoscopic right lower lobectomy on 10/18/2018.  He did well initially went home and about 5 days but presented back a few weeks later with an empyema.  I did a redo VATS to drain the empyema and decorticate the lung.  We were not able to get his lung to completely reexpand so he went home with an empyema tube in place.  Cultures grew Klebsiella and Streptococcus.  He has completed antibiotics.  He initially went home with a feeding tube in place.  Speech therapy was working with him and we were hoping he would be able to eat quickly, but unfortunately he was continued to aspirate, so we had IR place a PEG tube.   He had a modified barium swallow done yesterday.  He still has significant aspiration but is able to take some pures and honey thick liquids with precautions.  His wife has been changing the ostomy bag around his chest tube as needed.  His drainage today is mostly serous fluid with some exudate.  Past Medical History:  Diagnosis Date  . Anxiety   . Arthritis    "hands" (09/21/2016)  . Bleeding stomach ulcer 2000s  . COPD (chronic obstructive pulmonary disease) ([place])   . Cough   . Depression   . Dyspnea   . Hypertension   . Pneumonia    "now and several times before this" (09/21/2016)  . PVD (peripheral vascular disease) ([place]) 2014   prev PTCA on RLE    Current Outpatient Medications  Medication Sig Dispense Refill  . albuterol (VENTOLIN HFA) 108 (90 Base) MCG/ACT inhaler Inhale 2 puffs into the lungs every 4 (four) hours as needed for wheezing or shortness of breath.    . Amino Acids-Protein Hydrolys (FEEDING SUPPLEMENT,  PRO-STAT SUGAR FREE 64,) LIQD Place 30 mLs into feeding tube 2 (two) times daily. 887 mL 0  . atorvastatin (LIPITOR) 20 MG tablet Place 1 tablet (20 mg total) into feeding tube at bedtime. 30 tablet 1  . budesonide-formoterol (SYMBICORT) 160-4.5 MCG/ACT inhaler Inhale 2 puffs into the lungs 2 (two) times daily.    . diclofenac sodium (VOLTAREN) 1 % GEL Apply 2 g topically 4 (four) times daily as needed (pain).     Marland Kitchen docusate (COLACE) 50 MG/5ML liquid Place 10 mLs (100 mg total) into feeding tube at bedtime. (Patient taking differently: Place 100 mg into feeding tube at bedtime as needed for mild constipation. ) 123XX123 mL 0  . folic acid (FOLVITE) 1 MG tablet Place 1 tablet (1 mg total) into feeding tube daily. 30 tablet 1  . gabapentin (NEURONTIN) 250 MG/5ML solution Place 6 mLs (300 mg total) into feeding tube at bedtime. 180 mL 0  . levalbuterol (XOPENEX) 0.63 MG/3ML nebulizer solution Take 3 mLs (0.63 mg total) by nebulization every 4 (four) hours as needed for wheezing or shortness of breath. 3 mL 12  . lidocaine (LIDODERM) 5 % Place 1 patch onto the skin daily as needed (pain).     . metoprolol tartrate (LOPRESSOR) 25 MG tablet Place 12.5 mg into feeding tube daily. CRUSH MED    .  sertraline (ZOLOFT) 100 MG tablet Place 2 tablets (200 mg total) into feeding tube daily. 30 tablet 0  . Tiotropium Bromide Monohydrate (SPIRIVA RESPIMAT) 2.5 MCG/ACT AERS Inhale 2 puffs into the lungs daily.    . traZODone (DESYREL) 50 MG tablet Place 1 tablet (50 mg total) into feeding tube at bedtime. 30 tablet 1  . vitamin B-12 1000 MCG tablet Place 1 tablet (1,000 mcg total) into feeding tube daily. 30 tablet 1   No current facility-administered medications for this visit.     Physical Exam BP 113/62   Pulse 70   Temp 97.7 F (36.5 C) (Skin)   Resp 20   Ht 5\' 10"  (1.778 m)   Wt 137 lb (62.1 kg)   SpO2 94% Comment: RA  BMI 19.82 kg/m  78 year old man in no acute distress Alert and oriented x3 Lungs  diminished at right base, otherwise clear Chest tube site serous fluid with exudate in the bag, some erythema around tube site.  Tube advanced 2 cm.  Impression: William Kirk is a 78 year old gentleman who had a lobectomy for chronic bronchiectasis with recurrent massive hemoptysis.  He developed an empyema postoperatively.  I did a VATS for decortication but was not able to get the lung to completely reexpand so he had a residual space.  He now has an empyema tube that we are slowly advancing out over time.  I will plan to see him back next week and we will do another chest x-ray.  Aspiration-modified barium swallow showed continued aspiration.  He is now able to take some limited p.o.'s, pures and honey thick.  Speech therapy is working with him on that.  No fevers or signs of infection  Plan: Return in 1 week with PA and lateral chest x-ray  William Nakayama, MD [facility] [phone]

## 2019-01-09 DIAGNOSIS — J869 Pyothorax without fistula: Secondary | ICD-10-CM | POA: Diagnosis not present

## 2019-01-09 DIAGNOSIS — L89322 Pressure ulcer of left buttock, stage 2: Secondary | ICD-10-CM | POA: Diagnosis not present

## 2019-01-09 DIAGNOSIS — J449 Chronic obstructive pulmonary disease, unspecified: Secondary | ICD-10-CM | POA: Diagnosis not present

## 2019-01-09 DIAGNOSIS — I739 Peripheral vascular disease, unspecified: Secondary | ICD-10-CM | POA: Diagnosis not present

## 2019-01-09 DIAGNOSIS — J9611 Chronic respiratory failure with hypoxia: Secondary | ICD-10-CM | POA: Diagnosis not present

## 2019-01-09 DIAGNOSIS — I4891 Unspecified atrial fibrillation: Secondary | ICD-10-CM | POA: Diagnosis not present

## 2019-01-09 DIAGNOSIS — Z4682 Encounter for fitting and adjustment of non-vascular catheter: Secondary | ICD-10-CM | POA: Diagnosis not present

## 2019-01-09 DIAGNOSIS — I10 Essential (primary) hypertension: Secondary | ICD-10-CM | POA: Diagnosis not present

## 2019-01-09 DIAGNOSIS — T8143XA Infection following a procedure, organ and space surgical site, initial encounter: Secondary | ICD-10-CM | POA: Diagnosis not present

## 2019-01-10 DIAGNOSIS — L89322 Pressure ulcer of left buttock, stage 2: Secondary | ICD-10-CM | POA: Diagnosis not present

## 2019-01-10 DIAGNOSIS — J449 Chronic obstructive pulmonary disease, unspecified: Secondary | ICD-10-CM | POA: Diagnosis not present

## 2019-01-10 DIAGNOSIS — Z4682 Encounter for fitting and adjustment of non-vascular catheter: Secondary | ICD-10-CM | POA: Diagnosis not present

## 2019-01-10 DIAGNOSIS — J9611 Chronic respiratory failure with hypoxia: Secondary | ICD-10-CM | POA: Diagnosis not present

## 2019-01-10 DIAGNOSIS — T8143XA Infection following a procedure, organ and space surgical site, initial encounter: Secondary | ICD-10-CM | POA: Diagnosis not present

## 2019-01-10 DIAGNOSIS — I10 Essential (primary) hypertension: Secondary | ICD-10-CM | POA: Diagnosis not present

## 2019-01-10 DIAGNOSIS — I4891 Unspecified atrial fibrillation: Secondary | ICD-10-CM | POA: Diagnosis not present

## 2019-01-10 DIAGNOSIS — I739 Peripheral vascular disease, unspecified: Secondary | ICD-10-CM | POA: Diagnosis not present

## 2019-01-10 DIAGNOSIS — J869 Pyothorax without fistula: Secondary | ICD-10-CM | POA: Diagnosis not present

## 2019-01-14 ENCOUNTER — Other Ambulatory Visit: Payer: Self-pay | Admitting: Thoracic Surgery (Cardiothoracic Vascular Surgery)

## 2019-01-14 DIAGNOSIS — T8143XA Infection following a procedure, organ and space surgical site, initial encounter: Secondary | ICD-10-CM | POA: Diagnosis not present

## 2019-01-14 DIAGNOSIS — J869 Pyothorax without fistula: Secondary | ICD-10-CM | POA: Diagnosis not present

## 2019-01-14 DIAGNOSIS — L89322 Pressure ulcer of left buttock, stage 2: Secondary | ICD-10-CM | POA: Diagnosis not present

## 2019-01-14 DIAGNOSIS — I739 Peripheral vascular disease, unspecified: Secondary | ICD-10-CM | POA: Diagnosis not present

## 2019-01-14 DIAGNOSIS — I10 Essential (primary) hypertension: Secondary | ICD-10-CM | POA: Diagnosis not present

## 2019-01-14 DIAGNOSIS — Z4682 Encounter for fitting and adjustment of non-vascular catheter: Secondary | ICD-10-CM | POA: Diagnosis not present

## 2019-01-14 DIAGNOSIS — I4891 Unspecified atrial fibrillation: Secondary | ICD-10-CM | POA: Diagnosis not present

## 2019-01-14 DIAGNOSIS — J449 Chronic obstructive pulmonary disease, unspecified: Secondary | ICD-10-CM | POA: Diagnosis not present

## 2019-01-14 DIAGNOSIS — J9611 Chronic respiratory failure with hypoxia: Secondary | ICD-10-CM | POA: Diagnosis not present

## 2019-01-15 ENCOUNTER — Other Ambulatory Visit: Payer: Self-pay | Admitting: Thoracic Surgery (Cardiothoracic Vascular Surgery)

## 2019-01-15 ENCOUNTER — Ambulatory Visit (INDEPENDENT_AMBULATORY_CARE_PROVIDER_SITE_OTHER): Payer: Self-pay | Admitting: Thoracic Surgery (Cardiothoracic Vascular Surgery)

## 2019-01-15 ENCOUNTER — Other Ambulatory Visit: Payer: Self-pay

## 2019-01-15 ENCOUNTER — Ambulatory Visit
Admission: RE | Admit: 2019-01-15 | Discharge: 2019-01-15 | Disposition: A | Discharge status: Home / Self Care | Payer: No Typology Code available for payment source | Source: Ambulatory Visit | Attending: Thoracic Surgery (Cardiothoracic Vascular Surgery) | Admitting: Thoracic Surgery (Cardiothoracic Vascular Surgery)

## 2019-01-15 VITALS — BP 130/74 | HR 69 | Temp 98.1°F | Resp 20 | Ht 70.0 in | Wt 140.0 lb

## 2019-01-15 DIAGNOSIS — E43 Unspecified severe protein-calorie malnutrition: Secondary | ICD-10-CM | POA: Diagnosis not present

## 2019-01-15 DIAGNOSIS — R1310 Dysphagia, unspecified: Secondary | ICD-10-CM

## 2019-01-15 DIAGNOSIS — Z5189 Encounter for other specified aftercare: Secondary | ICD-10-CM

## 2019-01-15 DIAGNOSIS — J869 Pyothorax without fistula: Secondary | ICD-10-CM

## 2019-01-15 DIAGNOSIS — J984 Other disorders of lung: Secondary | ICD-10-CM | POA: Diagnosis not present

## 2019-01-15 DIAGNOSIS — E871 Hypo-osmolality and hyponatremia: Secondary | ICD-10-CM | POA: Diagnosis not present

## 2019-01-15 NOTE — Progress Notes (Signed)
[address]       [place] [address]             [phone]     HPI: William Kirk returns for scheduled visit  William Kirk is a 78 year old man who had a thoracoscopic right lower lobectomy in September 2020 for bronchiectasis with recurrent massive hemoptysis.  He did well initially but presented back a couple weeks later with an empyema.  We did a redo VATS to drain the empyema and decorticate the lung but were not able to get his lung to completely reexpand.  He has had an empyema tube in place since discharge.  He has completed his antibiotics.  He continues to drain a fair amount into an empyema bag.  He has some discomfort associated with the drain but that is manageable.  Has been working with speech therapy and is reportedly making pretty good progress with them.  They are requesting another barium swallow in about 2 weeks.  Past Medical History:  Diagnosis Date  . Anxiety   . Arthritis    "hands" (09/21/2016)  . Bleeding stomach ulcer 2000s  . COPD (chronic obstructive pulmonary disease) ([place])   . Cough   . Depression   . Dyspnea   . Hypertension   . Pneumonia    "now and several times before this" (09/21/2016)  . PVD (peripheral vascular disease) ([place]) 2014   prev PTCA on RLE    Current Outpatient Medications  Medication Sig Dispense Refill  . albuterol (VENTOLIN HFA) 108 (90 Base) MCG/ACT inhaler Inhale 2 puffs into the lungs every 4 (four) hours as needed for wheezing or shortness of breath.    . Amino Acids-Protein Hydrolys (FEEDING SUPPLEMENT, PRO-STAT SUGAR FREE 64,) LIQD Place 30 mLs into feeding tube 2 (two) times daily. 887 mL 0  . atorvastatin (LIPITOR) 20 MG tablet Place 1 tablet (20 mg total) into feeding tube at bedtime. 30 tablet 1  . budesonide-formoterol (SYMBICORT) 160-4.5 MCG/ACT inhaler Inhale 2 puffs into the lungs 2 (two) times daily.    . diclofenac sodium (VOLTAREN) 1 % GEL Apply 2 g topically 4 (four) times daily as  needed (pain).     Marland Kitchen docusate (COLACE) 50 MG/5ML liquid Place 10 mLs (100 mg total) into feeding tube at bedtime. (Patient taking differently: Place 100 mg into feeding tube at bedtime as needed for mild constipation. ) 123XX123 mL 0  . folic acid (FOLVITE) 1 MG tablet Place 1 tablet (1 mg total) into feeding tube daily. 30 tablet 1  . levalbuterol (XOPENEX) 0.63 MG/3ML nebulizer solution Take 3 mLs (0.63 mg total) by nebulization every 4 (four) hours as needed for wheezing or shortness of breath. 3 mL 12  . lidocaine (LIDODERM) 5 % Place 1 patch onto the skin daily as needed (pain).     . metoprolol tartrate (LOPRESSOR) 25 MG tablet Place 12.5 mg into feeding tube daily. CRUSH MED    . sertraline (ZOLOFT) 100 MG tablet Place 2 tablets (200 mg total) into feeding tube daily. 30 tablet 0  . Tiotropium Bromide Monohydrate (SPIRIVA RESPIMAT) 2.5 MCG/ACT AERS Inhale 2 puffs into the lungs daily.    . traZODone (DESYREL) 50 MG tablet Place 1 tablet (50 mg total) into feeding tube at bedtime. 30 tablet 1  . vitamin B-12 1000 MCG tablet Place 1 tablet (1,000 mcg total) into feeding tube daily. 30 tablet 1  . gabapentin (NEURONTIN) 250 MG/5ML solution Place 6 mLs (300 mg total)  into feeding tube at bedtime. 180 mL 0   No current facility-administered medications for this visit.     Physical Exam BP 130/74   Pulse 69   Temp 98.1 F (36.7 C)   Resp 20   Ht 5\' 10"  (1.778 m)   Wt 140 lb (63.5 kg)   SpO2 95% Comment: RA  BMI 20.10 kg/m  78 year old man in no acute distress Alert and oriented x3 with no focal deficits Lungs with diminished breath sounds right base, otherwise clear Empyema tube in place with murky drainage, some local irritation around the tube but no evidence of cellulitis  Diagnostic Tests: CHEST - 2 VIEW  COMPARISON:  01/01/2019  FINDINGS: Chronic changes in the right lung with indwelling chest tube. No pneumothorax. Pleural reaction. Left lung is clear.  Heart size normal.   No acute bony changes.  IMPRESSION: Stable appearance of the chest.  No pneumothorax.   Electronically Signed   By: Leticia Penna M.D.   On: 01/15/2019 14:33 I personally reviewed the chest x-ray images and concur with the findings noted above no significant changes.  Impression: [name] is a 78 year old gentleman who underwent thoracoscopic right lower lobectomy for chronic bronchiectasis with recurrent hemoptysis and repeated pneumonias.  He developed an empyema and required redo VATS for decortication.  Unfortunately his lung would not completely reexpand and has been managed with an empyema tube for almost 2 months now.  He continues to improve clinically.  He still has some drainage from that tube.  I advanced the tube another 2 cm and resecured it to the suture with a safety pin.  His wife will continue with local wound care and changing of drainage bags.  Aspiration-working with speech therapy.  We will try to arrange another modified barium swallow the week of Christmas and will coordinate with radiology and speech therapy.  Plan: Return in 2 weeks  Melrose Nakayama, MD [facility] [phone]

## 2019-01-16 DIAGNOSIS — I4891 Unspecified atrial fibrillation: Secondary | ICD-10-CM | POA: Diagnosis not present

## 2019-01-16 DIAGNOSIS — Z4682 Encounter for fitting and adjustment of non-vascular catheter: Secondary | ICD-10-CM | POA: Diagnosis not present

## 2019-01-16 DIAGNOSIS — J9611 Chronic respiratory failure with hypoxia: Secondary | ICD-10-CM | POA: Diagnosis not present

## 2019-01-16 DIAGNOSIS — J869 Pyothorax without fistula: Secondary | ICD-10-CM | POA: Diagnosis not present

## 2019-01-16 DIAGNOSIS — L89322 Pressure ulcer of left buttock, stage 2: Secondary | ICD-10-CM | POA: Diagnosis not present

## 2019-01-16 DIAGNOSIS — I739 Peripheral vascular disease, unspecified: Secondary | ICD-10-CM | POA: Diagnosis not present

## 2019-01-16 DIAGNOSIS — J449 Chronic obstructive pulmonary disease, unspecified: Secondary | ICD-10-CM | POA: Diagnosis not present

## 2019-01-16 DIAGNOSIS — I10 Essential (primary) hypertension: Secondary | ICD-10-CM | POA: Diagnosis not present

## 2019-01-16 DIAGNOSIS — T8143XA Infection following a procedure, organ and space surgical site, initial encounter: Secondary | ICD-10-CM | POA: Diagnosis not present

## 2019-01-17 ENCOUNTER — Other Ambulatory Visit (HOSPITAL_COMMUNITY): Payer: Self-pay | Admitting: *Deleted

## 2019-01-17 ENCOUNTER — Other Ambulatory Visit: Payer: Self-pay

## 2019-01-17 ENCOUNTER — Ambulatory Visit (INDEPENDENT_AMBULATORY_CARE_PROVIDER_SITE_OTHER): Payer: Self-pay | Admitting: Physician Assistant

## 2019-01-17 VITALS — BP 129/80 | HR 70 | Temp 97.8°F | Resp 20 | Ht 70.0 in | Wt 140.0 lb

## 2019-01-17 DIAGNOSIS — J9611 Chronic respiratory failure with hypoxia: Secondary | ICD-10-CM | POA: Diagnosis not present

## 2019-01-17 DIAGNOSIS — T8143XA Infection following a procedure, organ and space surgical site, initial encounter: Secondary | ICD-10-CM | POA: Diagnosis not present

## 2019-01-17 DIAGNOSIS — I4891 Unspecified atrial fibrillation: Secondary | ICD-10-CM | POA: Diagnosis not present

## 2019-01-17 DIAGNOSIS — J869 Pyothorax without fistula: Secondary | ICD-10-CM

## 2019-01-17 DIAGNOSIS — J449 Chronic obstructive pulmonary disease, unspecified: Secondary | ICD-10-CM | POA: Diagnosis not present

## 2019-01-17 DIAGNOSIS — L89322 Pressure ulcer of left buttock, stage 2: Secondary | ICD-10-CM | POA: Diagnosis not present

## 2019-01-17 DIAGNOSIS — I10 Essential (primary) hypertension: Secondary | ICD-10-CM | POA: Diagnosis not present

## 2019-01-17 DIAGNOSIS — I739 Peripheral vascular disease, unspecified: Secondary | ICD-10-CM | POA: Diagnosis not present

## 2019-01-17 DIAGNOSIS — R1310 Dysphagia, unspecified: Secondary | ICD-10-CM

## 2019-01-17 DIAGNOSIS — Z4682 Encounter for fitting and adjustment of non-vascular catheter: Secondary | ICD-10-CM | POA: Diagnosis not present

## 2019-01-17 DIAGNOSIS — Z5189 Encounter for other specified aftercare: Secondary | ICD-10-CM

## 2019-01-17 NOTE — Progress Notes (Signed)
HPI:  Mr. Alavi and his wife return to the office today stating his chest tube suture and his chest tube have fallen out.  His wife states they saw Dr. Roxan Hockey last week at which time the chest tube and suture were doing fine.   She states the patient's drainage has decreased.  She changed his collection bag yesterday.  She does state the nurse forgot a new one, so she doesn't have one with her today.  The patient denies fever.  She states he is also starting to take some oral intake as he continues to work with SLP.   Current Outpatient Medications  Medication Sig Dispense Refill  . albuterol (VENTOLIN HFA) 108 (90 Base) MCG/ACT inhaler Inhale 2 puffs into the lungs every 4 (four) hours as needed for wheezing or shortness of breath.    . Amino Acids-Protein Hydrolys (FEEDING SUPPLEMENT, PRO-STAT SUGAR FREE 64,) LIQD Place 30 mLs into feeding tube 2 (two) times daily. 887 mL 0  . atorvastatin (LIPITOR) 20 MG tablet Place 1 tablet (20 mg total) into feeding tube at bedtime. 30 tablet 1  . budesonide-formoterol (SYMBICORT) 160-4.5 MCG/ACT inhaler Inhale 2 puffs into the lungs 2 (two) times daily.    . diclofenac sodium (VOLTAREN) 1 % GEL Apply 2 g topically 4 (four) times daily as needed (pain).     Marland Kitchen docusate (COLACE) 50 MG/5ML liquid Place 10 mLs (100 mg total) into feeding tube at bedtime. (Patient taking differently: Place 100 mg into feeding tube at bedtime as needed for mild constipation. ) 123XX123 mL 0  . folic acid (FOLVITE) 1 MG tablet Place 1 tablet (1 mg total) into feeding tube daily. 30 tablet 1  . gabapentin (NEURONTIN) 250 MG/5ML solution Place 6 mLs (300 mg total) into feeding tube at bedtime. 180 mL 0  . levalbuterol (XOPENEX) 0.63 MG/3ML nebulizer solution Take 3 mLs (0.63 mg total) by nebulization every 4 (four) hours as needed for wheezing or shortness of breath. 3 mL 12  . lidocaine (LIDODERM) 5 % Place 1 patch onto the skin daily as needed (pain).     . metoprolol tartrate  (LOPRESSOR) 25 MG tablet Place 12.5 mg into feeding tube daily. CRUSH MED    . sertraline (ZOLOFT) 100 MG tablet Place 2 tablets (200 mg total) into feeding tube daily. 30 tablet 0  . Tiotropium Bromide Monohydrate (SPIRIVA RESPIMAT) 2.5 MCG/ACT AERS Inhale 2 puffs into the lungs daily.    . traZODone (DESYREL) 50 MG tablet Place 1 tablet (50 mg total) into feeding tube at bedtime. 30 tablet 1  . vitamin B-12 1000 MCG tablet Place 1 tablet (1,000 mcg total) into feeding tube daily. 30 tablet 1   No current facility-administered medications for this visit.   Physical Exam:  BP 129/80   Pulse 70   Temp 97.8 F (36.6 C) (Skin)   Resp 20   Ht 5\' 10"  (1.778 m)   Wt 140 lb (63.5 kg)   SpO2 95%   BMI 20.09 kg/m   Gen: no apparent distress Right Chest- there is a colostomy bag along right chest tube site.  There is purulent fluid in the bag and the chest tube was in the bag about 6-7 in long.  A/P:  I spoke via telephone with Dr. Roxan Hockey.  Being there was still quite a bit of chest tube left and drainage remains purulent, the tube should be replaced.  Dr. Roxan Hockey came to the office and placed a new clean chest tube in the  chest tube site.  Patient tolerated without difficulty.  He will follow up as scheduled with Dr. Elyse Hsu, PA-C [facility] [phone]

## 2019-01-21 DIAGNOSIS — I4891 Unspecified atrial fibrillation: Secondary | ICD-10-CM | POA: Diagnosis not present

## 2019-01-21 DIAGNOSIS — J9611 Chronic respiratory failure with hypoxia: Secondary | ICD-10-CM | POA: Diagnosis not present

## 2019-01-21 DIAGNOSIS — T8143XA Infection following a procedure, organ and space surgical site, initial encounter: Secondary | ICD-10-CM | POA: Diagnosis not present

## 2019-01-21 DIAGNOSIS — I10 Essential (primary) hypertension: Secondary | ICD-10-CM | POA: Diagnosis not present

## 2019-01-21 DIAGNOSIS — L89322 Pressure ulcer of left buttock, stage 2: Secondary | ICD-10-CM | POA: Diagnosis not present

## 2019-01-21 DIAGNOSIS — J869 Pyothorax without fistula: Secondary | ICD-10-CM | POA: Diagnosis not present

## 2019-01-21 DIAGNOSIS — J449 Chronic obstructive pulmonary disease, unspecified: Secondary | ICD-10-CM | POA: Diagnosis not present

## 2019-01-21 DIAGNOSIS — I739 Peripheral vascular disease, unspecified: Secondary | ICD-10-CM | POA: Diagnosis not present

## 2019-01-21 DIAGNOSIS — Z4682 Encounter for fitting and adjustment of non-vascular catheter: Secondary | ICD-10-CM | POA: Diagnosis not present

## 2019-01-22 ENCOUNTER — Other Ambulatory Visit: Payer: Self-pay | Admitting: Physician Assistant

## 2019-01-22 DIAGNOSIS — I739 Peripheral vascular disease, unspecified: Secondary | ICD-10-CM | POA: Diagnosis not present

## 2019-01-22 DIAGNOSIS — L89322 Pressure ulcer of left buttock, stage 2: Secondary | ICD-10-CM | POA: Diagnosis not present

## 2019-01-22 DIAGNOSIS — J9611 Chronic respiratory failure with hypoxia: Secondary | ICD-10-CM | POA: Diagnosis not present

## 2019-01-22 DIAGNOSIS — J869 Pyothorax without fistula: Secondary | ICD-10-CM | POA: Diagnosis not present

## 2019-01-22 DIAGNOSIS — Z4682 Encounter for fitting and adjustment of non-vascular catheter: Secondary | ICD-10-CM | POA: Diagnosis not present

## 2019-01-22 DIAGNOSIS — T8143XA Infection following a procedure, organ and space surgical site, initial encounter: Secondary | ICD-10-CM | POA: Diagnosis not present

## 2019-01-22 DIAGNOSIS — I4891 Unspecified atrial fibrillation: Secondary | ICD-10-CM | POA: Diagnosis not present

## 2019-01-22 DIAGNOSIS — E871 Hypo-osmolality and hyponatremia: Secondary | ICD-10-CM | POA: Diagnosis not present

## 2019-01-22 DIAGNOSIS — I10 Essential (primary) hypertension: Secondary | ICD-10-CM | POA: Diagnosis not present

## 2019-01-22 DIAGNOSIS — E43 Unspecified severe protein-calorie malnutrition: Secondary | ICD-10-CM | POA: Diagnosis not present

## 2019-01-22 DIAGNOSIS — J449 Chronic obstructive pulmonary disease, unspecified: Secondary | ICD-10-CM | POA: Diagnosis not present

## 2019-01-23 ENCOUNTER — Other Ambulatory Visit: Payer: Self-pay | Admitting: Thoracic Surgery (Cardiothoracic Vascular Surgery)

## 2019-01-23 DIAGNOSIS — J869 Pyothorax without fistula: Secondary | ICD-10-CM

## 2019-01-23 DIAGNOSIS — E43 Unspecified severe protein-calorie malnutrition: Secondary | ICD-10-CM | POA: Diagnosis not present

## 2019-01-23 DIAGNOSIS — E871 Hypo-osmolality and hyponatremia: Secondary | ICD-10-CM | POA: Diagnosis not present

## 2019-01-24 DIAGNOSIS — I4891 Unspecified atrial fibrillation: Secondary | ICD-10-CM | POA: Diagnosis not present

## 2019-01-24 DIAGNOSIS — I10 Essential (primary) hypertension: Secondary | ICD-10-CM | POA: Diagnosis not present

## 2019-01-24 DIAGNOSIS — T8143XD Infection following a procedure, organ and space surgical site, subsequent encounter: Secondary | ICD-10-CM | POA: Diagnosis not present

## 2019-01-24 DIAGNOSIS — E43 Unspecified severe protein-calorie malnutrition: Secondary | ICD-10-CM | POA: Diagnosis not present

## 2019-01-24 DIAGNOSIS — R1313 Dysphagia, pharyngeal phase: Secondary | ICD-10-CM | POA: Diagnosis not present

## 2019-01-24 DIAGNOSIS — I739 Peripheral vascular disease, unspecified: Secondary | ICD-10-CM | POA: Diagnosis not present

## 2019-01-24 DIAGNOSIS — J9611 Chronic respiratory failure with hypoxia: Secondary | ICD-10-CM | POA: Diagnosis not present

## 2019-01-24 DIAGNOSIS — J449 Chronic obstructive pulmonary disease, unspecified: Secondary | ICD-10-CM | POA: Diagnosis not present

## 2019-01-24 DIAGNOSIS — Z431 Encounter for attention to gastrostomy: Secondary | ICD-10-CM | POA: Diagnosis not present

## 2019-01-25 ENCOUNTER — Other Ambulatory Visit (HOSPITAL_COMMUNITY): Payer: Self-pay | Admitting: *Deleted

## 2019-01-25 DIAGNOSIS — R1310 Dysphagia, unspecified: Secondary | ICD-10-CM

## 2019-01-28 DIAGNOSIS — R1313 Dysphagia, pharyngeal phase: Secondary | ICD-10-CM | POA: Diagnosis not present

## 2019-01-28 DIAGNOSIS — I10 Essential (primary) hypertension: Secondary | ICD-10-CM | POA: Diagnosis not present

## 2019-01-28 DIAGNOSIS — J9611 Chronic respiratory failure with hypoxia: Secondary | ICD-10-CM | POA: Diagnosis not present

## 2019-01-28 DIAGNOSIS — J449 Chronic obstructive pulmonary disease, unspecified: Secondary | ICD-10-CM | POA: Diagnosis not present

## 2019-01-28 DIAGNOSIS — T8143XD Infection following a procedure, organ and space surgical site, subsequent encounter: Secondary | ICD-10-CM | POA: Diagnosis not present

## 2019-01-28 DIAGNOSIS — Z431 Encounter for attention to gastrostomy: Secondary | ICD-10-CM | POA: Diagnosis not present

## 2019-01-28 DIAGNOSIS — E43 Unspecified severe protein-calorie malnutrition: Secondary | ICD-10-CM | POA: Diagnosis not present

## 2019-01-28 DIAGNOSIS — I739 Peripheral vascular disease, unspecified: Secondary | ICD-10-CM | POA: Diagnosis not present

## 2019-01-28 DIAGNOSIS — I4891 Unspecified atrial fibrillation: Secondary | ICD-10-CM | POA: Diagnosis not present

## 2019-01-29 ENCOUNTER — Ambulatory Visit
Admission: RE | Admit: 2019-01-29 | Discharge: 2019-01-29 | Disposition: A | Discharge status: Home / Self Care | Payer: No Typology Code available for payment source | Source: Ambulatory Visit | Attending: Thoracic Surgery (Cardiothoracic Vascular Surgery) | Admitting: Thoracic Surgery (Cardiothoracic Vascular Surgery)

## 2019-01-29 ENCOUNTER — Encounter: Payer: Self-pay | Admitting: Thoracic Surgery (Cardiothoracic Vascular Surgery)

## 2019-01-29 ENCOUNTER — Other Ambulatory Visit: Payer: Self-pay

## 2019-01-29 ENCOUNTER — Ambulatory Visit (INDEPENDENT_AMBULATORY_CARE_PROVIDER_SITE_OTHER): Payer: Self-pay | Admitting: Thoracic Surgery (Cardiothoracic Vascular Surgery)

## 2019-01-29 VITALS — BP 112/62 | HR 65 | Temp 98.1°F | Resp 16 | Ht 70.0 in | Wt 141.9 lb

## 2019-01-29 DIAGNOSIS — Z9689 Presence of other specified functional implants: Secondary | ICD-10-CM

## 2019-01-29 DIAGNOSIS — Z902 Acquired absence of lung [part of]: Secondary | ICD-10-CM

## 2019-01-29 DIAGNOSIS — J869 Pyothorax without fistula: Secondary | ICD-10-CM

## 2019-01-29 DIAGNOSIS — R1310 Dysphagia, unspecified: Secondary | ICD-10-CM

## 2019-01-29 DIAGNOSIS — R0602 Shortness of breath: Secondary | ICD-10-CM | POA: Diagnosis not present

## 2019-01-29 NOTE — Progress Notes (Signed)
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  I did a right lower lobectomy in September 2020.  He presented back few weeks later with an empyema and had to have a redo VATS to drain the empyema.  I was able to partially decorticate the lung but was not able to get the lung to completely reexpand.  He went home with an entertainment tube in place and we have been slowly removing it over time.  He presented back on 01/17/2019 after the tube had fallen out.  We were able to replace the tube and place a new suture.  He continues to drain some fluid into an empyema bag.  He still has some discomfort associated with the drain.  The amount of drainage has been decreasing.  He was found to be aspirating.  Has been working with speech therapy.  He is doing well with solids and was able to drink some ice water recently.  He is having another swallow evaluation tomorrow.  Past Medical History:  Diagnosis Date  . Anxiety   . Arthritis    "hands" (09/21/2016)  . Bleeding stomach ulcer 2000s  . COPD (chronic obstructive pulmonary disease) ([place])   . Cough   . Depression   . Dyspnea   . Hypertension   . Pneumonia    "now and several times before this" (09/21/2016)  . PVD (peripheral vascular disease) ([place]) 2014   prev PTCA on RLE    Current Outpatient Medications  Medication Sig Dispense Refill  . albuterol (VENTOLIN HFA) 108 (90 Base) MCG/ACT inhaler Inhale 2 puffs into the lungs every 4 (four) hours as needed for wheezing or shortness of breath.    . Amino Acids-Protein Hydrolys (FEEDING SUPPLEMENT, PRO-STAT SUGAR FREE 64,) LIQD Place 30 mLs into feeding tube 2 (two) times daily. 887 mL 0  . atorvastatin (LIPITOR) 20 MG tablet Place 1 tablet (20 mg total) into  feeding tube at bedtime. 30 tablet 1  . budesonide-formoterol (SYMBICORT) 160-4.5 MCG/ACT inhaler Inhale 2 puffs into the lungs 2 (two) times daily.    . diclofenac sodium (VOLTAREN) 1 % GEL Apply 2 g topically 4 (four) times daily as needed (pain).     Marland Kitchen docusate (COLACE) 50 MG/5ML liquid Place 10 mLs (100 mg total) into feeding tube at bedtime. (Patient taking differently: Place 100 mg into feeding tube at bedtime as needed for mild constipation. ) 123XX123 mL 0  . folic acid (FOLVITE) 1 MG tablet Place 1 tablet (1 mg total) into feeding tube daily. 30 tablet 1  . gabapentin (NEURONTIN) 250 MG/5ML solution Place 6 mLs (300 mg total) into feeding tube at bedtime. 180 mL 0  . levalbuterol (XOPENEX) 0.63 MG/3ML nebulizer solution Take 3 mLs (0.63 mg total) by nebulization every 4 (four) hours as needed for wheezing or shortness of breath. 3 mL 12  . lidocaine (LIDODERM) 5 % Place 1 patch onto the skin daily as needed (pain).     . metoprolol tartrate (LOPRESSOR) 25 MG tablet Place 12.5 mg into feeding tube daily. CRUSH MED    . sertraline (ZOLOFT) 100 MG tablet Place 2 tablets (200 mg total) into feeding tube daily. 30 tablet  0  . Tiotropium Bromide Monohydrate (SPIRIVA RESPIMAT) 2.5 MCG/ACT AERS Inhale 2 puffs into the lungs daily.    . traZODone (DESYREL) 50 MG tablet Place 1 tablet (50 mg total) into feeding tube at bedtime. 30 tablet 1  . vitamin B-12 1000 MCG tablet Place 1 tablet (1,000 mcg total) into feeding tube daily. 30 tablet 1   No current facility-administered medications for this visit.    Physical Exam BP 112/62 (BP Location: Left Arm, Patient Position: Sitting, Cuff Size: Normal)   Pulse 65   Temp 98.1 F (36.7 C)   Resp 16   Ht 5\' 10"  (1.778 m)   Wt 141 lb 14 oz (64.4 kg)   SpO2 96% Comment: RA  BMI 20.98 kg/m  78 year old man in no acute distress Alert and oriented x3 with no focal deficits Chest tube site with erythema and a small amount of purulent drainage Chest tube  advanced 2 cm  Diagnostic Tests: CHEST - 2 VIEW  COMPARISON:  01/15/2019.  CT, 11/06/2018.  FINDINGS: There is opacity at the right lung base. Air-fluid level is noted posteriorly, likely a loculated pleural space hydropneumothorax. A collection of posterior air was noted in this location on the prior radiographs. The fluid level is new. Otherwise, the lung base opacity is stable, consistent with a combination of atelectasis and pleural fluid. Short length chest tube has its tip projecting at the right costophrenic sulcus. Tip lies more anterior and inferior than it did on the prior chest radiographs. Pulmonary anastomosis staples along the inferior right lung also unchanged.  Mild scarring noted at the right apex. Remainder of the right lung is clear.  Left lung is hyperexpanded. Minor scarring is noted in the apex and lateral left upper lobe. Left lung otherwise clear. No left pleural effusion or pneumothorax.  Cardiac silhouette is normal in size. No mediastinal or hilar masses.  Skeletal structures are grossly intact.  IMPRESSION: 1. There is persistent right lung base opacity consistent with a combination of pleural fluid and atelectasis. 2. There is an air-fluid level in the posterior right lower hemithorax, which is likely a loculated hydropneumothorax. The dependent fluid in this is new since the prior chest radiograph. 3. Short length right-sided chest tube has its intrathoracic tip projecting more anterior and inferior than it did on the prior study. 4. No other changes.   Electronically Signed   By: Lajean Manes M.D.   On: 01/29/2019 12:52 I personally reviewed the chest x-ray images and concur with the findings noted above.  Impression: William Kirk is a 78 year old gentleman who had a right lower lobectomy for bronchiectasis with recurrent massive hemoptysis back in September.  He developed an empyema.  He has had an empyema tube in place for  almost 2 months now.  We have been slowly advancing that out.  At this point the tube is nearly completely out and I would not be surprised if it falls out altogether before he returns next week.  If it were to follow do not think it needs to be replaced but they would likely need to leave the bag in place for a week or so until the drainage slows down.  He does have a small air-fluid level posteriorly.  We will have to keep an eye on that.  He is not having any fevers, chills, or any other signs that there is an active infection going on.  Aspiration-working with speech therapy, continues to slowly improve.  Plan: Return on 02/07/2019 for follow  up with repeat CXR If tube falls out in meantime just keep bag over incision and monitor for fever or other signs of illness  Melrose Nakayama, MD [facility] [phone]

## 2019-01-30 ENCOUNTER — Ambulatory Visit (HOSPITAL_COMMUNITY)
Admission: RE | Admit: 2019-01-30 | Discharge: 2019-01-30 | Disposition: A | Discharge status: Home / Self Care | Payer: No Typology Code available for payment source | Source: Ambulatory Visit | Attending: Thoracic Surgery (Cardiothoracic Vascular Surgery) | Admitting: Thoracic Surgery (Cardiothoracic Vascular Surgery)

## 2019-01-30 DIAGNOSIS — R1310 Dysphagia, unspecified: Secondary | ICD-10-CM | POA: Diagnosis present

## 2019-01-31 DIAGNOSIS — R1313 Dysphagia, pharyngeal phase: Secondary | ICD-10-CM | POA: Diagnosis not present

## 2019-01-31 DIAGNOSIS — T8143XD Infection following a procedure, organ and space surgical site, subsequent encounter: Secondary | ICD-10-CM | POA: Diagnosis not present

## 2019-01-31 DIAGNOSIS — E43 Unspecified severe protein-calorie malnutrition: Secondary | ICD-10-CM | POA: Diagnosis not present

## 2019-01-31 DIAGNOSIS — I739 Peripheral vascular disease, unspecified: Secondary | ICD-10-CM | POA: Diagnosis not present

## 2019-01-31 DIAGNOSIS — I10 Essential (primary) hypertension: Secondary | ICD-10-CM | POA: Diagnosis not present

## 2019-01-31 DIAGNOSIS — Z431 Encounter for attention to gastrostomy: Secondary | ICD-10-CM | POA: Diagnosis not present

## 2019-01-31 DIAGNOSIS — I4891 Unspecified atrial fibrillation: Secondary | ICD-10-CM | POA: Diagnosis not present

## 2019-01-31 DIAGNOSIS — J449 Chronic obstructive pulmonary disease, unspecified: Secondary | ICD-10-CM | POA: Diagnosis not present

## 2019-01-31 DIAGNOSIS — J9611 Chronic respiratory failure with hypoxia: Secondary | ICD-10-CM | POA: Diagnosis not present

## 2019-02-04 DIAGNOSIS — I739 Peripheral vascular disease, unspecified: Secondary | ICD-10-CM | POA: Diagnosis not present

## 2019-02-04 DIAGNOSIS — Z431 Encounter for attention to gastrostomy: Secondary | ICD-10-CM | POA: Diagnosis not present

## 2019-02-04 DIAGNOSIS — T8143XD Infection following a procedure, organ and space surgical site, subsequent encounter: Secondary | ICD-10-CM | POA: Diagnosis not present

## 2019-02-04 DIAGNOSIS — R1313 Dysphagia, pharyngeal phase: Secondary | ICD-10-CM | POA: Diagnosis not present

## 2019-02-04 DIAGNOSIS — E43 Unspecified severe protein-calorie malnutrition: Secondary | ICD-10-CM | POA: Diagnosis not present

## 2019-02-04 DIAGNOSIS — I4891 Unspecified atrial fibrillation: Secondary | ICD-10-CM | POA: Diagnosis not present

## 2019-02-04 DIAGNOSIS — J449 Chronic obstructive pulmonary disease, unspecified: Secondary | ICD-10-CM | POA: Diagnosis not present

## 2019-02-04 DIAGNOSIS — J9611 Chronic respiratory failure with hypoxia: Secondary | ICD-10-CM | POA: Diagnosis not present

## 2019-02-04 DIAGNOSIS — I10 Essential (primary) hypertension: Secondary | ICD-10-CM | POA: Diagnosis not present

## 2019-02-05 ENCOUNTER — Other Ambulatory Visit: Payer: Self-pay | Admitting: Thoracic Surgery (Cardiothoracic Vascular Surgery)

## 2019-02-05 DIAGNOSIS — J869 Pyothorax without fistula: Secondary | ICD-10-CM

## 2019-02-06 DIAGNOSIS — J449 Chronic obstructive pulmonary disease, unspecified: Secondary | ICD-10-CM | POA: Diagnosis not present

## 2019-02-06 DIAGNOSIS — R1313 Dysphagia, pharyngeal phase: Secondary | ICD-10-CM | POA: Diagnosis not present

## 2019-02-06 DIAGNOSIS — T8143XD Infection following a procedure, organ and space surgical site, subsequent encounter: Secondary | ICD-10-CM | POA: Diagnosis not present

## 2019-02-06 DIAGNOSIS — E43 Unspecified severe protein-calorie malnutrition: Secondary | ICD-10-CM | POA: Diagnosis not present

## 2019-02-06 DIAGNOSIS — I10 Essential (primary) hypertension: Secondary | ICD-10-CM | POA: Diagnosis not present

## 2019-02-06 DIAGNOSIS — I4891 Unspecified atrial fibrillation: Secondary | ICD-10-CM | POA: Diagnosis not present

## 2019-02-06 DIAGNOSIS — I739 Peripheral vascular disease, unspecified: Secondary | ICD-10-CM | POA: Diagnosis not present

## 2019-02-06 DIAGNOSIS — J9611 Chronic respiratory failure with hypoxia: Secondary | ICD-10-CM | POA: Diagnosis not present

## 2019-02-06 DIAGNOSIS — Z431 Encounter for attention to gastrostomy: Secondary | ICD-10-CM | POA: Diagnosis not present

## 2019-02-07 ENCOUNTER — Ambulatory Visit
Admission: RE | Admit: 2019-02-07 | Discharge: 2019-02-07 | Disposition: A | Discharge status: Home / Self Care | Payer: No Typology Code available for payment source | Source: Ambulatory Visit | Attending: Thoracic Surgery (Cardiothoracic Vascular Surgery) | Admitting: Thoracic Surgery (Cardiothoracic Vascular Surgery)

## 2019-02-07 ENCOUNTER — Other Ambulatory Visit: Payer: Self-pay

## 2019-02-07 ENCOUNTER — Ambulatory Visit (INDEPENDENT_AMBULATORY_CARE_PROVIDER_SITE_OTHER): Payer: Self-pay | Admitting: Thoracic Surgery (Cardiothoracic Vascular Surgery)

## 2019-02-07 VITALS — BP 117/64 | HR 64 | Temp 97.5°F | Resp 20 | Ht 70.0 in | Wt 143.0 lb

## 2019-02-07 DIAGNOSIS — J869 Pyothorax without fistula: Secondary | ICD-10-CM

## 2019-02-07 DIAGNOSIS — J90 Pleural effusion, not elsewhere classified: Secondary | ICD-10-CM | POA: Diagnosis not present

## 2019-02-07 NOTE — Progress Notes (Signed)
[address]       [place] [address]             [phone]     HPI: 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  He did well initially postoperatively, but presented back about 3 weeks later with an empyema.  He ended up having a residual infected space and went home with an empyema tube.  I last saw him on 12/22 and the tube was almost completely out.  We left the tube in place but told him not to worry about it if the tube were to fall out.  The tube fell out the next day.  He is not having any significant drainage from the area.  He denies any fevers, chills, sweats, cough, or shortness of breath.  He is able to take solid foods but is not able to drink liquids yet.  Past Medical History:  Diagnosis Date  . Anxiety   . Arthritis    "hands" (09/21/2016)  . Bleeding stomach ulcer 2000s  . COPD (chronic obstructive pulmonary disease) ([facility])   . Cough   . Depression   . Dyspnea   . Hypertension   . Pneumonia    "now and several times before this" (09/21/2016)  . PVD (peripheral vascular disease) ([place] [facility]) 2014   prev PTCA on RLE    Current Outpatient Medications  Medication Sig Dispense Refill  . albuterol (VENTOLIN HFA) 108 (90 Base) MCG/ACT inhaler Inhale 2 puffs into the lungs every 4 (four) hours as needed for wheezing or shortness of breath.    . Amino Acids-Protein Hydrolys (FEEDING SUPPLEMENT, PRO-STAT SUGAR FREE 64,) LIQD Place 30 mLs into feeding tube 2 (two) times daily. 887 mL 0  . atorvastatin (LIPITOR) 20 MG tablet Place 1 tablet (20 mg total) into feeding tube at bedtime. 30 tablet 1  . budesonide-formoterol (SYMBICORT) 160-4.5 MCG/ACT inhaler Inhale 2 puffs into the lungs 2 (two) times daily.    . diclofenac sodium (VOLTAREN) 1 % GEL Apply 2 g topically 4 (four)  times daily as needed (pain).     . folic acid (FOLVITE) 1 MG tablet Place 1 tablet (1 mg total) into feeding tube daily. 30 tablet 1  . levalbuterol (XOPENEX) 0.63 MG/3ML nebulizer solution Take 3 mLs (0.63 mg total) by nebulization every 4 (four) hours as needed for wheezing or shortness of breath. 3 mL 12  . lidocaine (LIDODERM) 5 % Place 1 patch onto the skin daily as needed (pain).     . metoprolol tartrate (LOPRESSOR) 25 MG tablet Place 12.5 mg into feeding tube daily. CRUSH MED    . sertraline (ZOLOFT) 100 MG tablet Place 2 tablets (200 mg total) into feeding tube daily. 30 tablet 0  . Tiotropium Bromide Monohydrate (SPIRIVA RESPIMAT) 2.5 MCG/ACT AERS Inhale 2 puffs into the lungs daily.    . traZODone (DESYREL) 50 MG tablet Place 1 tablet (50 mg total) into feeding tube at bedtime. 30 tablet 1  . vitamin B-12 1000 MCG tablet Place 1 tablet (1,000 mcg total) into feeding tube daily. 30 tablet 1  . docusate (COLACE) 50 MG/5ML liquid Place 10 mLs (100 mg total) into feeding tube at bedtime. (Patient taking differently: Place 100 mg into feeding tube at bedtime as needed for mild constipation. ) 100 mL 0  .  gabapentin (NEURONTIN) 250 MG/5ML solution Place 6 mLs (300 mg total) into feeding tube at bedtime. 180 mL 0   No current facility-administered medications for this visit.    Physical Exam BP 117/64 (BP Location: Right Arm, Patient Position: Sitting, Cuff Size: Normal)   Pulse 64   Temp (!) 97.5 F (36.4 C) (Skin)   Resp 20   Ht 5\' 10"  (1.778 m)   Wt 143 lb (64.9 kg)   SpO2 97% Comment: RA  BMI 20.42 kg/m  78 year old man in no acute distress Alert and oriented x3 with no focal deficits Incision well-healed, chest tube site with mild erythema, no drainage  Diagnostic Tests: CHEST - 2 VIEW  COMPARISON:  Chest x-ray 01/29/2019.  FINDINGS: Moderate right pleural effusion with layering air-fluid levels, compatible with reported empyema. This is associated with  extensive passive atelectasis in the right lung base. Volume loss in the right hemithorax as evidenced by chronic rightward shift of cardiomediastinal silhouette. Left lung is clear. Trace left pleural effusion. Chronic pleuroparenchymal thickening in the apices of the lungs bilaterally (right greater than left), presumably chronic post infectious or inflammatory scarring. No evidence of pulmonary edema. Heart size is normal. Upper mediastinal contours are within normal limits. Aortic atherosclerosis.  IMPRESSION: 1. Overall, radiographic appearance the chest is essentially unchanged with moderate right-sided pleural fluid and gas collection which is concerning for an empyema.   Electronically Signed   By: Vinnie Langton M.D.   On: 02/07/2019 09:56 I personally reviewed the chest x-ray images and concur with the findings noted above  Impression: William Kirk is a 78 year old gentleman who had a right lower lobectomy for bronchiectasis with recurrent hemoptysis back in September.  He developed an empyema which required redo VATS for decortication.  He had incomplete reexpansion and was left with a space.  That was managed with an empyema tube that was slowly advanced over time.  The tube was now been out for a week.  He has tolerated that well.  He is not having any fevers, chills, or sweats.  He has not had any increase in pain.  His chest x-ray is stable.  There is some loculated fluid posteriorly but that is been present previously without the tube reaching it.  He certainly is at risk for recurrent empyema.  But I do not think there is any further intervention indicated at this time.  I advised Mr. and Mrs. Levins to be alert for any worsening of his respiratory status, fevers, chills, night sweats or any increase in pain.  If he were to experience any of that he should contact me right away.  Aspiration-working with speech therapy.  Able to take solids but still not able to  take liquids.  Plan: Return in 3 weeks with PA and lateral chest x-ray  Melrose Nakayama, MD [facility] [phone]

## 2019-02-08 DIAGNOSIS — Z431 Encounter for attention to gastrostomy: Secondary | ICD-10-CM | POA: Diagnosis not present

## 2019-02-08 DIAGNOSIS — I4891 Unspecified atrial fibrillation: Secondary | ICD-10-CM | POA: Diagnosis not present

## 2019-02-08 DIAGNOSIS — I739 Peripheral vascular disease, unspecified: Secondary | ICD-10-CM | POA: Diagnosis not present

## 2019-02-08 DIAGNOSIS — T8143XD Infection following a procedure, organ and space surgical site, subsequent encounter: Secondary | ICD-10-CM | POA: Diagnosis not present

## 2019-02-08 DIAGNOSIS — J449 Chronic obstructive pulmonary disease, unspecified: Secondary | ICD-10-CM | POA: Diagnosis not present

## 2019-02-08 DIAGNOSIS — J9611 Chronic respiratory failure with hypoxia: Secondary | ICD-10-CM | POA: Diagnosis not present

## 2019-02-08 DIAGNOSIS — I10 Essential (primary) hypertension: Secondary | ICD-10-CM | POA: Diagnosis not present

## 2019-02-08 DIAGNOSIS — E43 Unspecified severe protein-calorie malnutrition: Secondary | ICD-10-CM | POA: Diagnosis not present

## 2019-02-08 DIAGNOSIS — R1313 Dysphagia, pharyngeal phase: Secondary | ICD-10-CM | POA: Diagnosis not present

## 2019-02-11 DIAGNOSIS — R1313 Dysphagia, pharyngeal phase: Secondary | ICD-10-CM | POA: Diagnosis not present

## 2019-02-11 DIAGNOSIS — E43 Unspecified severe protein-calorie malnutrition: Secondary | ICD-10-CM | POA: Diagnosis not present

## 2019-02-11 DIAGNOSIS — J9611 Chronic respiratory failure with hypoxia: Secondary | ICD-10-CM | POA: Diagnosis not present

## 2019-02-11 DIAGNOSIS — T8143XD Infection following a procedure, organ and space surgical site, subsequent encounter: Secondary | ICD-10-CM | POA: Diagnosis not present

## 2019-02-11 DIAGNOSIS — I10 Essential (primary) hypertension: Secondary | ICD-10-CM | POA: Diagnosis not present

## 2019-02-11 DIAGNOSIS — J449 Chronic obstructive pulmonary disease, unspecified: Secondary | ICD-10-CM | POA: Diagnosis not present

## 2019-02-11 DIAGNOSIS — I4891 Unspecified atrial fibrillation: Secondary | ICD-10-CM | POA: Diagnosis not present

## 2019-02-11 DIAGNOSIS — I739 Peripheral vascular disease, unspecified: Secondary | ICD-10-CM | POA: Diagnosis not present

## 2019-02-11 DIAGNOSIS — Z431 Encounter for attention to gastrostomy: Secondary | ICD-10-CM | POA: Diagnosis not present

## 2019-02-13 DIAGNOSIS — I10 Essential (primary) hypertension: Secondary | ICD-10-CM | POA: Diagnosis not present

## 2019-02-13 DIAGNOSIS — T8143XD Infection following a procedure, organ and space surgical site, subsequent encounter: Secondary | ICD-10-CM | POA: Diagnosis not present

## 2019-02-13 DIAGNOSIS — R1313 Dysphagia, pharyngeal phase: Secondary | ICD-10-CM | POA: Diagnosis not present

## 2019-02-13 DIAGNOSIS — I4891 Unspecified atrial fibrillation: Secondary | ICD-10-CM | POA: Diagnosis not present

## 2019-02-13 DIAGNOSIS — I739 Peripheral vascular disease, unspecified: Secondary | ICD-10-CM | POA: Diagnosis not present

## 2019-02-13 DIAGNOSIS — Z431 Encounter for attention to gastrostomy: Secondary | ICD-10-CM | POA: Diagnosis not present

## 2019-02-13 DIAGNOSIS — J9611 Chronic respiratory failure with hypoxia: Secondary | ICD-10-CM | POA: Diagnosis not present

## 2019-02-13 DIAGNOSIS — E43 Unspecified severe protein-calorie malnutrition: Secondary | ICD-10-CM | POA: Diagnosis not present

## 2019-02-13 DIAGNOSIS — J449 Chronic obstructive pulmonary disease, unspecified: Secondary | ICD-10-CM | POA: Diagnosis not present

## 2019-02-14 DIAGNOSIS — I4891 Unspecified atrial fibrillation: Secondary | ICD-10-CM | POA: Diagnosis not present

## 2019-02-14 DIAGNOSIS — R1313 Dysphagia, pharyngeal phase: Secondary | ICD-10-CM | POA: Diagnosis not present

## 2019-02-14 DIAGNOSIS — I10 Essential (primary) hypertension: Secondary | ICD-10-CM | POA: Diagnosis not present

## 2019-02-14 DIAGNOSIS — Z431 Encounter for attention to gastrostomy: Secondary | ICD-10-CM | POA: Diagnosis not present

## 2019-02-14 DIAGNOSIS — J449 Chronic obstructive pulmonary disease, unspecified: Secondary | ICD-10-CM | POA: Diagnosis not present

## 2019-02-14 DIAGNOSIS — E43 Unspecified severe protein-calorie malnutrition: Secondary | ICD-10-CM | POA: Diagnosis not present

## 2019-02-14 DIAGNOSIS — I739 Peripheral vascular disease, unspecified: Secondary | ICD-10-CM | POA: Diagnosis not present

## 2019-02-14 DIAGNOSIS — T8143XD Infection following a procedure, organ and space surgical site, subsequent encounter: Secondary | ICD-10-CM | POA: Diagnosis not present

## 2019-02-14 DIAGNOSIS — J9611 Chronic respiratory failure with hypoxia: Secondary | ICD-10-CM | POA: Diagnosis not present

## 2019-02-18 ENCOUNTER — Other Ambulatory Visit: Payer: Self-pay | Admitting: Physician Assistant

## 2019-02-18 DIAGNOSIS — J9611 Chronic respiratory failure with hypoxia: Secondary | ICD-10-CM | POA: Diagnosis not present

## 2019-02-18 DIAGNOSIS — I10 Essential (primary) hypertension: Secondary | ICD-10-CM | POA: Diagnosis not present

## 2019-02-18 DIAGNOSIS — R1313 Dysphagia, pharyngeal phase: Secondary | ICD-10-CM | POA: Diagnosis not present

## 2019-02-18 DIAGNOSIS — E43 Unspecified severe protein-calorie malnutrition: Secondary | ICD-10-CM | POA: Diagnosis not present

## 2019-02-18 DIAGNOSIS — T8143XD Infection following a procedure, organ and space surgical site, subsequent encounter: Secondary | ICD-10-CM | POA: Diagnosis not present

## 2019-02-18 DIAGNOSIS — I739 Peripheral vascular disease, unspecified: Secondary | ICD-10-CM | POA: Diagnosis not present

## 2019-02-18 DIAGNOSIS — J449 Chronic obstructive pulmonary disease, unspecified: Secondary | ICD-10-CM | POA: Diagnosis not present

## 2019-02-18 DIAGNOSIS — I4891 Unspecified atrial fibrillation: Secondary | ICD-10-CM | POA: Diagnosis not present

## 2019-02-18 DIAGNOSIS — Z431 Encounter for attention to gastrostomy: Secondary | ICD-10-CM | POA: Diagnosis not present

## 2019-02-20 DIAGNOSIS — I739 Peripheral vascular disease, unspecified: Secondary | ICD-10-CM | POA: Diagnosis not present

## 2019-02-20 DIAGNOSIS — J449 Chronic obstructive pulmonary disease, unspecified: Secondary | ICD-10-CM | POA: Diagnosis not present

## 2019-02-20 DIAGNOSIS — T8143XD Infection following a procedure, organ and space surgical site, subsequent encounter: Secondary | ICD-10-CM | POA: Diagnosis not present

## 2019-02-20 DIAGNOSIS — R1313 Dysphagia, pharyngeal phase: Secondary | ICD-10-CM | POA: Diagnosis not present

## 2019-02-20 DIAGNOSIS — I10 Essential (primary) hypertension: Secondary | ICD-10-CM | POA: Diagnosis not present

## 2019-02-20 DIAGNOSIS — I4891 Unspecified atrial fibrillation: Secondary | ICD-10-CM | POA: Diagnosis not present

## 2019-02-20 DIAGNOSIS — Z431 Encounter for attention to gastrostomy: Secondary | ICD-10-CM | POA: Diagnosis not present

## 2019-02-20 DIAGNOSIS — E43 Unspecified severe protein-calorie malnutrition: Secondary | ICD-10-CM | POA: Diagnosis not present

## 2019-02-20 DIAGNOSIS — J9611 Chronic respiratory failure with hypoxia: Secondary | ICD-10-CM | POA: Diagnosis not present

## 2019-02-21 DIAGNOSIS — T8143XD Infection following a procedure, organ and space surgical site, subsequent encounter: Secondary | ICD-10-CM | POA: Diagnosis not present

## 2019-02-21 DIAGNOSIS — J449 Chronic obstructive pulmonary disease, unspecified: Secondary | ICD-10-CM | POA: Diagnosis not present

## 2019-02-21 DIAGNOSIS — I10 Essential (primary) hypertension: Secondary | ICD-10-CM | POA: Diagnosis not present

## 2019-02-21 DIAGNOSIS — R1313 Dysphagia, pharyngeal phase: Secondary | ICD-10-CM | POA: Diagnosis not present

## 2019-02-21 DIAGNOSIS — J9611 Chronic respiratory failure with hypoxia: Secondary | ICD-10-CM | POA: Diagnosis not present

## 2019-02-21 DIAGNOSIS — I739 Peripheral vascular disease, unspecified: Secondary | ICD-10-CM | POA: Diagnosis not present

## 2019-02-21 DIAGNOSIS — I4891 Unspecified atrial fibrillation: Secondary | ICD-10-CM | POA: Diagnosis not present

## 2019-02-21 DIAGNOSIS — Z431 Encounter for attention to gastrostomy: Secondary | ICD-10-CM | POA: Diagnosis not present

## 2019-02-21 DIAGNOSIS — E43 Unspecified severe protein-calorie malnutrition: Secondary | ICD-10-CM | POA: Diagnosis not present

## 2019-02-22 DIAGNOSIS — E43 Unspecified severe protein-calorie malnutrition: Secondary | ICD-10-CM | POA: Diagnosis not present

## 2019-02-22 DIAGNOSIS — E871 Hypo-osmolality and hyponatremia: Secondary | ICD-10-CM | POA: Diagnosis not present

## 2019-02-25 ENCOUNTER — Other Ambulatory Visit: Payer: Self-pay | Admitting: Thoracic Surgery (Cardiothoracic Vascular Surgery)

## 2019-02-25 DIAGNOSIS — J449 Chronic obstructive pulmonary disease, unspecified: Secondary | ICD-10-CM | POA: Diagnosis not present

## 2019-02-25 DIAGNOSIS — I739 Peripheral vascular disease, unspecified: Secondary | ICD-10-CM | POA: Diagnosis not present

## 2019-02-25 DIAGNOSIS — R1313 Dysphagia, pharyngeal phase: Secondary | ICD-10-CM | POA: Diagnosis not present

## 2019-02-25 DIAGNOSIS — T8143XD Infection following a procedure, organ and space surgical site, subsequent encounter: Secondary | ICD-10-CM | POA: Diagnosis not present

## 2019-02-25 DIAGNOSIS — J9611 Chronic respiratory failure with hypoxia: Secondary | ICD-10-CM | POA: Diagnosis not present

## 2019-02-25 DIAGNOSIS — I10 Essential (primary) hypertension: Secondary | ICD-10-CM | POA: Diagnosis not present

## 2019-02-25 DIAGNOSIS — Z431 Encounter for attention to gastrostomy: Secondary | ICD-10-CM | POA: Diagnosis not present

## 2019-02-25 DIAGNOSIS — J869 Pyothorax without fistula: Secondary | ICD-10-CM

## 2019-02-25 DIAGNOSIS — E43 Unspecified severe protein-calorie malnutrition: Secondary | ICD-10-CM | POA: Diagnosis not present

## 2019-02-25 DIAGNOSIS — I4891 Unspecified atrial fibrillation: Secondary | ICD-10-CM | POA: Diagnosis not present

## 2019-02-26 ENCOUNTER — Ambulatory Visit
Admission: RE | Admit: 2019-02-26 | Discharge: 2019-02-26 | Disposition: A | Discharge status: Home / Self Care | Payer: No Typology Code available for payment source | Source: Ambulatory Visit | Attending: Thoracic Surgery (Cardiothoracic Vascular Surgery) | Admitting: Thoracic Surgery (Cardiothoracic Vascular Surgery)

## 2019-02-26 ENCOUNTER — Encounter: Payer: Self-pay | Admitting: Thoracic Surgery (Cardiothoracic Vascular Surgery)

## 2019-02-26 ENCOUNTER — Other Ambulatory Visit: Payer: Self-pay

## 2019-02-26 ENCOUNTER — Ambulatory Visit (INDEPENDENT_AMBULATORY_CARE_PROVIDER_SITE_OTHER): Payer: Medicare HMO | Admitting: Thoracic Surgery (Cardiothoracic Vascular Surgery)

## 2019-02-26 VITALS — BP 117/62 | HR 59 | Temp 97.9°F | Resp 16 | Ht 70.0 in | Wt 146.7 lb

## 2019-02-26 DIAGNOSIS — Z5189 Encounter for other specified aftercare: Secondary | ICD-10-CM

## 2019-02-26 DIAGNOSIS — Z902 Acquired absence of lung [part of]: Secondary | ICD-10-CM | POA: Diagnosis not present

## 2019-02-26 DIAGNOSIS — J939 Pneumothorax, unspecified: Secondary | ICD-10-CM | POA: Diagnosis not present

## 2019-02-26 DIAGNOSIS — J869 Pyothorax without fistula: Secondary | ICD-10-CM

## 2019-02-26 DIAGNOSIS — J439 Emphysema, unspecified: Secondary | ICD-10-CM | POA: Diagnosis not present

## 2019-02-26 NOTE — Progress Notes (Signed)
[address]       [place] [address]             [phone]     HPI: Mr. 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  He developed an empyema which required a redo VATS for decortication and ultimately went home with an empyema tube in place.  We slowly advance that out over time.  The tube finally fell out on 01/30/2019.  I last saw him in the office on 02/07/2019.  He was doing well at that time.  He still had a loculated fluid collection with an air-fluid level and a small effusion.  In the interim since his last visit he is continued to improve.  He is not having any fevers, chills, or sweats.  He has not had any new respiratory issues.  He is doing well from a pain perspective.  He is able to swallow solids but is still having some difficulty with liquids.  He is going to have another swallowing evaluation on February 1.  He is still using his PEG tube for water.  Past Medical History:  Diagnosis Date  . Anxiety   . Arthritis    "hands" (09/21/2016)  . Bleeding stomach ulcer 2000s  . COPD (chronic obstructive pulmonary disease) ([place])   . Cough   . Depression   . Dyspnea   . Hypertension   . Pneumonia    "now and several times before this" (09/21/2016)  . PVD (peripheral vascular disease) ([facility]) 2014   prev PTCA on RLE    Current Outpatient Medications  Medication Sig Dispense Refill  . albuterol (VENTOLIN HFA) 108 (90 Base) MCG/ACT inhaler Inhale 2 puffs into the lungs every 4 (four) hours as needed for wheezing or shortness of breath.    . Amino Acids-Protein Hydrolys (FEEDING SUPPLEMENT, PRO-STAT SUGAR FREE 64,) LIQD Place 30 mLs into feeding tube 2 (two) times daily. 887 mL 0  . atorvastatin (LIPITOR) 20 MG tablet Place 1 tablet (20 mg total) into feeding tube at  bedtime. 30 tablet 1  . budesonide-formoterol (SYMBICORT) 160-4.5 MCG/ACT inhaler Inhale 2 puffs into the lungs 2 (two) times daily.    . diclofenac sodium (VOLTAREN) 1 % GEL Apply 2 g topically 4 (four) times daily as needed (pain).     . folic acid (FOLVITE) 1 MG tablet Place 1 tablet (1 mg total) into feeding tube daily. 30 tablet 1  . gabapentin (NEURONTIN) 250 MG/5ML solution Place 6 mLs (300 mg total) into feeding tube at bedtime. 180 mL 0  . levalbuterol (XOPENEX) 0.63 MG/3ML nebulizer solution Take 3 mLs (0.63 mg total) by nebulization every 4 (four) hours as needed for wheezing or shortness of breath. 3 mL 12  . lidocaine (LIDODERM) 5 % Place 1 patch onto the skin daily as needed (pain).     . metoprolol tartrate (LOPRESSOR) 25 MG tablet Place 12.5 mg into feeding tube daily. CRUSH MED    . sertraline (ZOLOFT) 100 MG tablet Place 2 tablets (200 mg total) into feeding tube daily. 30 tablet 0  . Tiotropium Bromide Monohydrate (SPIRIVA RESPIMAT) 2.5 MCG/ACT AERS Inhale 2 puffs into the lungs daily.    . traZODone (DESYREL) 50 MG tablet Place 1 tablet (50 mg total) into feeding tube at bedtime. 30 tablet 1  .  vitamin B-12 1000 MCG tablet Place 1 tablet (1,000 mcg total) into feeding tube daily. 30 tablet 1   No current facility-administered medications for this visit.    Physical Exam BP 117/62 (BP Location: Right Arm, Patient Position: Sitting, Cuff Size: Normal)   Pulse (!) 59   Temp 97.9 F (36.6 C)   Resp 16   Ht 5\' 10"  (1.778 m)   Wt 146 lb 11 oz (66.5 kg)   SpO2 95% Comment: RA  BMI 21.77 kg/m  79 year old man in no acute distress Alert and oriented x3 with no focal deficits Incisions well-healed Diminished breath sounds at right base, otherwise clear  Diagnostic Tests: CHEST - 2 VIEW  COMPARISON:  02/07/2019  FINDINGS: The hydropneumothorax at the right lung base appears slightly smaller than on the prior study. Air-fluid level persists.  The heart size and  pulmonary vascularity are normal. Left lung is clear.  Volume loss in the right hemithorax with shift of the heart and mediastinal structures to the right is unchanged.  Aortic atherosclerosis.  IMPRESSION: Slight decrease in the hydropneumothorax at the right lung base. No other change.   Electronically Signed   By: Lorriane Shire M.D.   On: 02/26/2019 09:42 I personally reviewed the chest x-ray images and concur with the findings noted above  Impression: Jaxten Sommerfield is a 79 year old gentleman who had a right lower lobectomy for bronchiectasis, chronic infection, and recurrent massive hemoptysis about 4 months ago.  That was complicated by an empyema.  His empyema tube finally came out about a month ago.  He is doing well at this time.  His chest x-ray still shows a small loculated area with an air-fluid level posteriorly.  If anything that was smaller this time and his overall effusion was smaller as well.  The tube is been out about 3 weeks now so I think if he was going to have a recurrent infection there we would have seen that by now.  However, he does know to call if he has any issues with fevers or chills or worsening pain.  He will have a repeat swallowing evaluation on February 1.  I will plan to see him back in about a month just to check on his progress.  Plan: Return in 1 month with PA and lateral chest x-ray  Melrose Nakayama, MD [facility] [phone]

## 2019-02-27 DIAGNOSIS — T8143XD Infection following a procedure, organ and space surgical site, subsequent encounter: Secondary | ICD-10-CM | POA: Diagnosis not present

## 2019-02-27 DIAGNOSIS — R1313 Dysphagia, pharyngeal phase: Secondary | ICD-10-CM | POA: Diagnosis not present

## 2019-02-27 DIAGNOSIS — I10 Essential (primary) hypertension: Secondary | ICD-10-CM | POA: Diagnosis not present

## 2019-02-27 DIAGNOSIS — Z431 Encounter for attention to gastrostomy: Secondary | ICD-10-CM | POA: Diagnosis not present

## 2019-02-27 DIAGNOSIS — I739 Peripheral vascular disease, unspecified: Secondary | ICD-10-CM | POA: Diagnosis not present

## 2019-02-27 DIAGNOSIS — J449 Chronic obstructive pulmonary disease, unspecified: Secondary | ICD-10-CM | POA: Diagnosis not present

## 2019-02-27 DIAGNOSIS — J9611 Chronic respiratory failure with hypoxia: Secondary | ICD-10-CM | POA: Diagnosis not present

## 2019-02-27 DIAGNOSIS — I4891 Unspecified atrial fibrillation: Secondary | ICD-10-CM | POA: Diagnosis not present

## 2019-02-27 DIAGNOSIS — E43 Unspecified severe protein-calorie malnutrition: Secondary | ICD-10-CM | POA: Diagnosis not present

## 2019-02-28 DIAGNOSIS — Z431 Encounter for attention to gastrostomy: Secondary | ICD-10-CM | POA: Diagnosis not present

## 2019-02-28 DIAGNOSIS — R1313 Dysphagia, pharyngeal phase: Secondary | ICD-10-CM | POA: Diagnosis not present

## 2019-02-28 DIAGNOSIS — I10 Essential (primary) hypertension: Secondary | ICD-10-CM | POA: Diagnosis not present

## 2019-02-28 DIAGNOSIS — I739 Peripheral vascular disease, unspecified: Secondary | ICD-10-CM | POA: Diagnosis not present

## 2019-02-28 DIAGNOSIS — J449 Chronic obstructive pulmonary disease, unspecified: Secondary | ICD-10-CM | POA: Diagnosis not present

## 2019-02-28 DIAGNOSIS — E43 Unspecified severe protein-calorie malnutrition: Secondary | ICD-10-CM | POA: Diagnosis not present

## 2019-02-28 DIAGNOSIS — T8143XD Infection following a procedure, organ and space surgical site, subsequent encounter: Secondary | ICD-10-CM | POA: Diagnosis not present

## 2019-02-28 DIAGNOSIS — J9611 Chronic respiratory failure with hypoxia: Secondary | ICD-10-CM | POA: Diagnosis not present

## 2019-02-28 DIAGNOSIS — I4891 Unspecified atrial fibrillation: Secondary | ICD-10-CM | POA: Diagnosis not present

## 2019-03-04 DIAGNOSIS — R1313 Dysphagia, pharyngeal phase: Secondary | ICD-10-CM | POA: Diagnosis not present

## 2019-03-04 DIAGNOSIS — T8143XD Infection following a procedure, organ and space surgical site, subsequent encounter: Secondary | ICD-10-CM | POA: Diagnosis not present

## 2019-03-04 DIAGNOSIS — I4891 Unspecified atrial fibrillation: Secondary | ICD-10-CM | POA: Diagnosis not present

## 2019-03-04 DIAGNOSIS — E43 Unspecified severe protein-calorie malnutrition: Secondary | ICD-10-CM | POA: Diagnosis not present

## 2019-03-04 DIAGNOSIS — I10 Essential (primary) hypertension: Secondary | ICD-10-CM | POA: Diagnosis not present

## 2019-03-04 DIAGNOSIS — Z431 Encounter for attention to gastrostomy: Secondary | ICD-10-CM | POA: Diagnosis not present

## 2019-03-04 DIAGNOSIS — J449 Chronic obstructive pulmonary disease, unspecified: Secondary | ICD-10-CM | POA: Diagnosis not present

## 2019-03-04 DIAGNOSIS — J9611 Chronic respiratory failure with hypoxia: Secondary | ICD-10-CM | POA: Diagnosis not present

## 2019-03-04 DIAGNOSIS — I739 Peripheral vascular disease, unspecified: Secondary | ICD-10-CM | POA: Diagnosis not present

## 2019-03-05 ENCOUNTER — Other Ambulatory Visit (HOSPITAL_COMMUNITY): Payer: Self-pay | Admitting: *Deleted

## 2019-03-05 ENCOUNTER — Other Ambulatory Visit: Payer: Self-pay | Admitting: Thoracic Surgery (Cardiothoracic Vascular Surgery)

## 2019-03-05 DIAGNOSIS — R1310 Dysphagia, unspecified: Secondary | ICD-10-CM

## 2019-03-05 DIAGNOSIS — I10 Essential (primary) hypertension: Secondary | ICD-10-CM | POA: Diagnosis not present

## 2019-03-05 DIAGNOSIS — E43 Unspecified severe protein-calorie malnutrition: Secondary | ICD-10-CM | POA: Diagnosis not present

## 2019-03-05 DIAGNOSIS — J449 Chronic obstructive pulmonary disease, unspecified: Secondary | ICD-10-CM | POA: Diagnosis not present

## 2019-03-05 DIAGNOSIS — R1313 Dysphagia, pharyngeal phase: Secondary | ICD-10-CM | POA: Diagnosis not present

## 2019-03-05 DIAGNOSIS — J9611 Chronic respiratory failure with hypoxia: Secondary | ICD-10-CM | POA: Diagnosis not present

## 2019-03-05 DIAGNOSIS — T8143XD Infection following a procedure, organ and space surgical site, subsequent encounter: Secondary | ICD-10-CM | POA: Diagnosis not present

## 2019-03-05 DIAGNOSIS — Z431 Encounter for attention to gastrostomy: Secondary | ICD-10-CM | POA: Diagnosis not present

## 2019-03-05 DIAGNOSIS — I739 Peripheral vascular disease, unspecified: Secondary | ICD-10-CM | POA: Diagnosis not present

## 2019-03-05 DIAGNOSIS — I4891 Unspecified atrial fibrillation: Secondary | ICD-10-CM | POA: Diagnosis not present

## 2019-03-06 DIAGNOSIS — I10 Essential (primary) hypertension: Secondary | ICD-10-CM | POA: Diagnosis not present

## 2019-03-06 DIAGNOSIS — J9611 Chronic respiratory failure with hypoxia: Secondary | ICD-10-CM | POA: Diagnosis not present

## 2019-03-06 DIAGNOSIS — E43 Unspecified severe protein-calorie malnutrition: Secondary | ICD-10-CM | POA: Diagnosis not present

## 2019-03-06 DIAGNOSIS — J449 Chronic obstructive pulmonary disease, unspecified: Secondary | ICD-10-CM | POA: Diagnosis not present

## 2019-03-06 DIAGNOSIS — I739 Peripheral vascular disease, unspecified: Secondary | ICD-10-CM | POA: Diagnosis not present

## 2019-03-06 DIAGNOSIS — Z431 Encounter for attention to gastrostomy: Secondary | ICD-10-CM | POA: Diagnosis not present

## 2019-03-06 DIAGNOSIS — T8143XD Infection following a procedure, organ and space surgical site, subsequent encounter: Secondary | ICD-10-CM | POA: Diagnosis not present

## 2019-03-06 DIAGNOSIS — R1313 Dysphagia, pharyngeal phase: Secondary | ICD-10-CM | POA: Diagnosis not present

## 2019-03-06 DIAGNOSIS — I4891 Unspecified atrial fibrillation: Secondary | ICD-10-CM | POA: Diagnosis not present

## 2019-03-07 ENCOUNTER — Ambulatory Visit: Payer: No Typology Code available for payment source

## 2019-03-11 DIAGNOSIS — R1313 Dysphagia, pharyngeal phase: Secondary | ICD-10-CM | POA: Diagnosis not present

## 2019-03-11 DIAGNOSIS — J9611 Chronic respiratory failure with hypoxia: Secondary | ICD-10-CM | POA: Diagnosis not present

## 2019-03-11 DIAGNOSIS — Z431 Encounter for attention to gastrostomy: Secondary | ICD-10-CM | POA: Diagnosis not present

## 2019-03-11 DIAGNOSIS — T8143XD Infection following a procedure, organ and space surgical site, subsequent encounter: Secondary | ICD-10-CM | POA: Diagnosis not present

## 2019-03-11 DIAGNOSIS — J449 Chronic obstructive pulmonary disease, unspecified: Secondary | ICD-10-CM | POA: Diagnosis not present

## 2019-03-11 DIAGNOSIS — I4891 Unspecified atrial fibrillation: Secondary | ICD-10-CM | POA: Diagnosis not present

## 2019-03-11 DIAGNOSIS — I10 Essential (primary) hypertension: Secondary | ICD-10-CM | POA: Diagnosis not present

## 2019-03-11 DIAGNOSIS — E43 Unspecified severe protein-calorie malnutrition: Secondary | ICD-10-CM | POA: Diagnosis not present

## 2019-03-11 DIAGNOSIS — I739 Peripheral vascular disease, unspecified: Secondary | ICD-10-CM | POA: Diagnosis not present

## 2019-03-12 ENCOUNTER — Ambulatory Visit (HOSPITAL_COMMUNITY)
Admission: RE | Admit: 2019-03-12 | Discharge: 2019-03-12 | Disposition: A | Discharge status: Home / Self Care | Payer: No Typology Code available for payment source | Source: Ambulatory Visit | Attending: Thoracic Surgery (Cardiothoracic Vascular Surgery) | Admitting: Thoracic Surgery (Cardiothoracic Vascular Surgery)

## 2019-03-12 ENCOUNTER — Other Ambulatory Visit: Payer: Self-pay

## 2019-03-12 ENCOUNTER — Other Ambulatory Visit (HOSPITAL_COMMUNITY): Payer: Self-pay | Admitting: Thoracic Surgery (Cardiothoracic Vascular Surgery)

## 2019-03-12 DIAGNOSIS — R1310 Dysphagia, unspecified: Secondary | ICD-10-CM | POA: Diagnosis not present

## 2019-03-13 DIAGNOSIS — J9611 Chronic respiratory failure with hypoxia: Secondary | ICD-10-CM | POA: Diagnosis not present

## 2019-03-13 DIAGNOSIS — J449 Chronic obstructive pulmonary disease, unspecified: Secondary | ICD-10-CM | POA: Diagnosis not present

## 2019-03-13 DIAGNOSIS — Z431 Encounter for attention to gastrostomy: Secondary | ICD-10-CM | POA: Diagnosis not present

## 2019-03-13 DIAGNOSIS — T8143XD Infection following a procedure, organ and space surgical site, subsequent encounter: Secondary | ICD-10-CM | POA: Diagnosis not present

## 2019-03-13 DIAGNOSIS — R1313 Dysphagia, pharyngeal phase: Secondary | ICD-10-CM | POA: Diagnosis not present

## 2019-03-13 DIAGNOSIS — I4891 Unspecified atrial fibrillation: Secondary | ICD-10-CM | POA: Diagnosis not present

## 2019-03-13 DIAGNOSIS — I10 Essential (primary) hypertension: Secondary | ICD-10-CM | POA: Diagnosis not present

## 2019-03-13 DIAGNOSIS — E43 Unspecified severe protein-calorie malnutrition: Secondary | ICD-10-CM | POA: Diagnosis not present

## 2019-03-13 DIAGNOSIS — I739 Peripheral vascular disease, unspecified: Secondary | ICD-10-CM | POA: Diagnosis not present

## 2019-03-14 DIAGNOSIS — T8143XD Infection following a procedure, organ and space surgical site, subsequent encounter: Secondary | ICD-10-CM | POA: Diagnosis not present

## 2019-03-14 DIAGNOSIS — I10 Essential (primary) hypertension: Secondary | ICD-10-CM | POA: Diagnosis not present

## 2019-03-14 DIAGNOSIS — E43 Unspecified severe protein-calorie malnutrition: Secondary | ICD-10-CM | POA: Diagnosis not present

## 2019-03-14 DIAGNOSIS — R1313 Dysphagia, pharyngeal phase: Secondary | ICD-10-CM | POA: Diagnosis not present

## 2019-03-14 DIAGNOSIS — Z431 Encounter for attention to gastrostomy: Secondary | ICD-10-CM | POA: Diagnosis not present

## 2019-03-14 DIAGNOSIS — J449 Chronic obstructive pulmonary disease, unspecified: Secondary | ICD-10-CM | POA: Diagnosis not present

## 2019-03-14 DIAGNOSIS — I4891 Unspecified atrial fibrillation: Secondary | ICD-10-CM | POA: Diagnosis not present

## 2019-03-14 DIAGNOSIS — I739 Peripheral vascular disease, unspecified: Secondary | ICD-10-CM | POA: Diagnosis not present

## 2019-03-14 DIAGNOSIS — J9611 Chronic respiratory failure with hypoxia: Secondary | ICD-10-CM | POA: Diagnosis not present

## 2019-03-18 DIAGNOSIS — I4891 Unspecified atrial fibrillation: Secondary | ICD-10-CM | POA: Diagnosis not present

## 2019-03-18 DIAGNOSIS — Z431 Encounter for attention to gastrostomy: Secondary | ICD-10-CM | POA: Diagnosis not present

## 2019-03-18 DIAGNOSIS — E43 Unspecified severe protein-calorie malnutrition: Secondary | ICD-10-CM | POA: Diagnosis not present

## 2019-03-18 DIAGNOSIS — R1313 Dysphagia, pharyngeal phase: Secondary | ICD-10-CM | POA: Diagnosis not present

## 2019-03-18 DIAGNOSIS — I10 Essential (primary) hypertension: Secondary | ICD-10-CM | POA: Diagnosis not present

## 2019-03-18 DIAGNOSIS — J9611 Chronic respiratory failure with hypoxia: Secondary | ICD-10-CM | POA: Diagnosis not present

## 2019-03-18 DIAGNOSIS — T8143XD Infection following a procedure, organ and space surgical site, subsequent encounter: Secondary | ICD-10-CM | POA: Diagnosis not present

## 2019-03-18 DIAGNOSIS — I739 Peripheral vascular disease, unspecified: Secondary | ICD-10-CM | POA: Diagnosis not present

## 2019-03-18 DIAGNOSIS — J449 Chronic obstructive pulmonary disease, unspecified: Secondary | ICD-10-CM | POA: Diagnosis not present

## 2019-03-21 DIAGNOSIS — T8143XD Infection following a procedure, organ and space surgical site, subsequent encounter: Secondary | ICD-10-CM | POA: Diagnosis not present

## 2019-03-21 DIAGNOSIS — I739 Peripheral vascular disease, unspecified: Secondary | ICD-10-CM | POA: Diagnosis not present

## 2019-03-21 DIAGNOSIS — J9611 Chronic respiratory failure with hypoxia: Secondary | ICD-10-CM | POA: Diagnosis not present

## 2019-03-21 DIAGNOSIS — R1313 Dysphagia, pharyngeal phase: Secondary | ICD-10-CM | POA: Diagnosis not present

## 2019-03-21 DIAGNOSIS — I10 Essential (primary) hypertension: Secondary | ICD-10-CM | POA: Diagnosis not present

## 2019-03-21 DIAGNOSIS — J449 Chronic obstructive pulmonary disease, unspecified: Secondary | ICD-10-CM | POA: Diagnosis not present

## 2019-03-21 DIAGNOSIS — E43 Unspecified severe protein-calorie malnutrition: Secondary | ICD-10-CM | POA: Diagnosis not present

## 2019-03-21 DIAGNOSIS — Z431 Encounter for attention to gastrostomy: Secondary | ICD-10-CM | POA: Diagnosis not present

## 2019-03-21 DIAGNOSIS — I4891 Unspecified atrial fibrillation: Secondary | ICD-10-CM | POA: Diagnosis not present

## 2019-03-25 DIAGNOSIS — E871 Hypo-osmolality and hyponatremia: Secondary | ICD-10-CM | POA: Diagnosis not present

## 2019-03-25 DIAGNOSIS — E43 Unspecified severe protein-calorie malnutrition: Secondary | ICD-10-CM | POA: Diagnosis not present

## 2019-04-01 ENCOUNTER — Other Ambulatory Visit: Payer: Self-pay | Admitting: Thoracic Surgery (Cardiothoracic Vascular Surgery)

## 2019-04-01 ENCOUNTER — Encounter: Payer: Self-pay | Admitting: Thoracic Surgery (Cardiothoracic Vascular Surgery)

## 2019-04-01 DIAGNOSIS — J869 Pyothorax without fistula: Secondary | ICD-10-CM

## 2019-04-02 ENCOUNTER — Ambulatory Visit (INDEPENDENT_AMBULATORY_CARE_PROVIDER_SITE_OTHER): Payer: No Typology Code available for payment source | Admitting: Thoracic Surgery (Cardiothoracic Vascular Surgery)

## 2019-04-02 ENCOUNTER — Ambulatory Visit
Admission: RE | Admit: 2019-04-02 | Discharge: 2019-04-02 | Disposition: A | Discharge status: Home / Self Care | Payer: No Typology Code available for payment source | Source: Ambulatory Visit | Attending: Thoracic Surgery (Cardiothoracic Vascular Surgery) | Admitting: Thoracic Surgery (Cardiothoracic Vascular Surgery)

## 2019-04-02 ENCOUNTER — Encounter: Payer: Self-pay | Admitting: *Deleted

## 2019-04-02 ENCOUNTER — Encounter: Payer: Self-pay | Admitting: Thoracic Surgery (Cardiothoracic Vascular Surgery)

## 2019-04-02 ENCOUNTER — Other Ambulatory Visit: Payer: Self-pay

## 2019-04-02 VITALS — BP 133/72 | HR 63 | Temp 97.9°F | Resp 16 | Ht 70.0 in | Wt 154.7 lb

## 2019-04-02 DIAGNOSIS — R1310 Dysphagia, unspecified: Secondary | ICD-10-CM

## 2019-04-02 DIAGNOSIS — J95812 Postprocedural air leak: Secondary | ICD-10-CM | POA: Diagnosis not present

## 2019-04-02 DIAGNOSIS — J869 Pyothorax without fistula: Secondary | ICD-10-CM

## 2019-04-02 DIAGNOSIS — J90 Pleural effusion, not elsewhere classified: Secondary | ICD-10-CM | POA: Diagnosis not present

## 2019-04-02 DIAGNOSIS — Z902 Acquired absence of lung [part of]: Secondary | ICD-10-CM

## 2019-04-02 NOTE — Progress Notes (Signed)
[address]       [place] [address]             [phone]     HPI: William Kirk returns for scheduled follow-up  William Kirk is a 79 year old man with a history of tobacco abuse, COPD, chronic bronchiectasis and chronic right lower lobe infections who presented with recurrent massive hemoptysis.  I did a right lower lobectomy.  Postoperatively he developed an empyema which required redo VATS for decortication.  He was aspirating so a PEG tube was placed.  He went home with an empyema tube which was slowly advanced out over time and finally fell out on 01/30/2019.  I saw him in the office a month ago.  He was doing well at that time.  He feels well.  He still is aspirating.  He is able to get meet all his caloric needs with oral intake and he has been gaining weight.  He still is not able to swallow thin liquids so is using his G-tube for water.  He denies any shortness of breath.  He does not have any residual incisional pain.  Past Medical History:  Diagnosis Date  . Anxiety   . Arthritis    "hands" (09/21/2016)  . Bleeding stomach ulcer 2000s  . COPD (chronic obstructive pulmonary disease) ([place])   . Cough   . Depression   . Dyspnea   . Hypertension   . Pneumonia    "now and several times before this" (09/21/2016)  . PVD (peripheral vascular disease) ([place]) 2014   prev PTCA on RLE    Current Outpatient Medications  Medication Sig Dispense Refill  . albuterol (VENTOLIN HFA) 108 (90 Base) MCG/ACT inhaler Inhale 2 puffs into the lungs every 4 (four) hours as needed for wheezing or shortness of breath.    Marland Kitchen atorvastatin (LIPITOR) 20 MG tablet Place 1 tablet (20 mg total) into feeding tube at bedtime. (Patient taking differently: Place 20 mg into feeding tube at bedtime. TAKE ALL WITH  APPLESAUCE) 30 tablet 1  . budesonide-formoterol (SYMBICORT) 160-4.5 MCG/ACT inhaler Inhale 2 puffs into the lungs 2 (two) times daily.    . diclofenac sodium (VOLTAREN) 1 %  GEL Apply 2 g topically 4 (four) times daily as needed (pain).     . folic acid (FOLVITE) 1 MG tablet Place 1 tablet (1 mg total) into feeding tube daily. 30 tablet 1  . gabapentin (NEURONTIN) 250 MG/5ML solution Place 6 mLs (300 mg total) into feeding tube at bedtime. 180 mL 0  . levalbuterol (XOPENEX) 0.63 MG/3ML nebulizer solution Take 3 mLs (0.63 mg total) by nebulization every 4 (four) hours as needed for wheezing or shortness of breath. 3 mL 12  . lidocaine (LIDODERM) 5 % Place 1 patch onto the skin daily as needed (pain).     . metoprolol tartrate (LOPRESSOR) 25 MG tablet Place 12.5 mg into feeding tube daily. CRUSH MED    . sertraline (ZOLOFT) 100 MG tablet Place 2 tablets (200 mg total) into feeding tube daily. 30 tablet 0  . Tiotropium Bromide Monohydrate (SPIRIVA RESPIMAT) 2.5 MCG/ACT AERS Inhale 2 puffs into the lungs daily.    . traZODone (DESYREL) 50 MG tablet Place 1 tablet (50 mg total) into feeding tube at bedtime. 30 tablet 1  . vitamin B-12 1000 MCG tablet Place 1 tablet (1,000 mcg total) into feeding tube daily. 30 tablet 1   No current facility-administered medications for this visit.    Physical  Exam BP 133/72 (BP Location: Left Arm, Patient Position: Sitting, Cuff Size: Normal)   Pulse 63   Temp 97.9 F (36.6 C)   Resp 16   Ht 5\' 10"  (1.778 m)   Wt 154 lb 11 oz (70.2 kg)   SpO2 95% Comment: RA  BMI 22.31 kg/m  79 year old man in no acute distress Alert and oriented x3 with no focal deficits Lungs diminished at right base but otherwise clear Incisions well-healed Cardiac regular rate and rhythm  Diagnostic Tests: CHEST - 2 VIEW  COMPARISON:  February 26, 2019  FINDINGS: There remains air-fluid level at the right base with right base hydropneumothorax, unchanged. There is scarring with volume loss in the right apex as well as scarring and atelectasis in the right lower lobe. Volume loss on the right is stable. The left lung is hyperexpanded but  clear.  The heart size is normal. Pulmonary vascularity on the left is normal. There is distortion of pulmonary vascular on the right, stable. There is aortic atherosclerosis. No demonstrable adenopathy. No evident bone lesions.  IMPRESSION: Small hydropneumothorax at the right base with air-fluid level persisting. Areas of scarring and atelectasis on the right with volume loss the right remain. Left lung clear. Stable cardiac silhouette. No adenopathy appreciable. Aortic Atherosclerosis (ICD10-I70.0).   Electronically Signed   By: Lowella Grip III M.D.   On: 04/02/2019 09:35 I personally reviewed the chest x-ray images.  The air-fluid collection posteriorly is slightly smaller.  Impression: William Kirk is a 79 year old man with a history of tobacco abuse, COPD, chronic aspiration, and chronic pneumonia with right lower lobe bronchiectasis.  He had 2 episodes of massive hemoptysis.  I did a right lower lobectomy on him back in September.  He presented back about a month later with an empyema and had to have a VATS for decortication.  He had a space afterwards and his tube was treated as an empyema tube and removed slowly over time.  That finally came out 2 months ago.  Respiratory standpoint he is doing extremely well.  His oxygen saturations are good on room air.  He is not having any issues with coughing, wheezing, or shortness of breath.  Protein calorie malnutrition-significantly improved.  Able to meet his caloric needs with p.o. intake, but still using G-tube for water due to chronic aspiration.  Aspiration-still unable to take thin liquids.  I suspect he is chronically been aspirating and that is what led to his recurrent infections and bronchiectasis in the first place.  He has completed his work with speech therapy.  He has an appoint with Dr. Gwenette Greet tomorrow and discuss we will discuss with him if there are any further interventions to be done regarding that.  At  this point I am going to release William Kirk, but will be happy to see him back anytime if I can be of any assistance with his care.  Melrose Nakayama, MD [facility] [phone]

## 2019-04-22 DIAGNOSIS — E871 Hypo-osmolality and hyponatremia: Secondary | ICD-10-CM | POA: Diagnosis not present

## 2019-04-22 DIAGNOSIS — E43 Unspecified severe protein-calorie malnutrition: Secondary | ICD-10-CM | POA: Diagnosis not present

## 2019-05-23 DIAGNOSIS — E43 Unspecified severe protein-calorie malnutrition: Secondary | ICD-10-CM | POA: Diagnosis not present

## 2019-05-23 DIAGNOSIS — E871 Hypo-osmolality and hyponatremia: Secondary | ICD-10-CM | POA: Diagnosis not present

## 2019-06-22 DIAGNOSIS — E43 Unspecified severe protein-calorie malnutrition: Secondary | ICD-10-CM | POA: Diagnosis not present

## 2019-06-22 DIAGNOSIS — E871 Hypo-osmolality and hyponatremia: Secondary | ICD-10-CM | POA: Diagnosis not present

## 2019-07-23 DIAGNOSIS — E871 Hypo-osmolality and hyponatremia: Secondary | ICD-10-CM | POA: Diagnosis not present

## 2019-07-23 DIAGNOSIS — E43 Unspecified severe protein-calorie malnutrition: Secondary | ICD-10-CM | POA: Diagnosis not present

## 2019-08-22 DIAGNOSIS — E871 Hypo-osmolality and hyponatremia: Secondary | ICD-10-CM | POA: Diagnosis not present

## 2019-08-22 DIAGNOSIS — E43 Unspecified severe protein-calorie malnutrition: Secondary | ICD-10-CM | POA: Diagnosis not present

## 2019-11-05 ENCOUNTER — Other Ambulatory Visit (HOSPITAL_BASED_OUTPATIENT_CLINIC_OR_DEPARTMENT_OTHER): Payer: Self-pay | Admitting: Internal Medicine

## 2019-11-05 ENCOUNTER — Ambulatory Visit: Payer: No Typology Code available for payment source | Attending: Internal Medicine

## 2019-11-05 DIAGNOSIS — Z23 Encounter for immunization: Secondary | ICD-10-CM

## 2019-11-05 NOTE — Progress Notes (Signed)
Covid-19 Vaccination Clinic  Name:  William Kirk    MRN: [id number] DOB: 03-09-1940  11/05/2019  Mr. William Kirk was observed post Covid-19 immunization for 15 minutes without incident. He was provided with Vaccine Information Sheet and instruction to access the V-Safe system.  Vaccinated by Evorn Gong  Mr. William Kirk was instructed to call 911 with any severe reactions post vaccine:  Difficulty breathing   Swelling of face and throat   A fast heartbeat   A bad rash all over body   Dizziness and weakness

## 2019-11-08 MED FILL — MODERNA COVID-19 VACCINE 10: 100 | 1 days supply | Qty: 1 | Fill #0

## 2020-02-11 ENCOUNTER — Encounter: Payer: Self-pay | Admitting: Physical Medicine & Rehabilitation

## 2020-02-28 IMAGING — RF DG ABDOMEN 1V
1 series · 1 of 1 positions shown · non-contrast
Comparison: Abdominal radiograph dated 11/12/2018

CLINICAL DATA: 78-year-old male with feeding tube in place.

EXAM:
ABDOMEN - 1 VIEW

[Series 1: fluoro_barium 1fps_bw · 0.17mm/px · 1 of 1 slices shown]
[im 1/1]
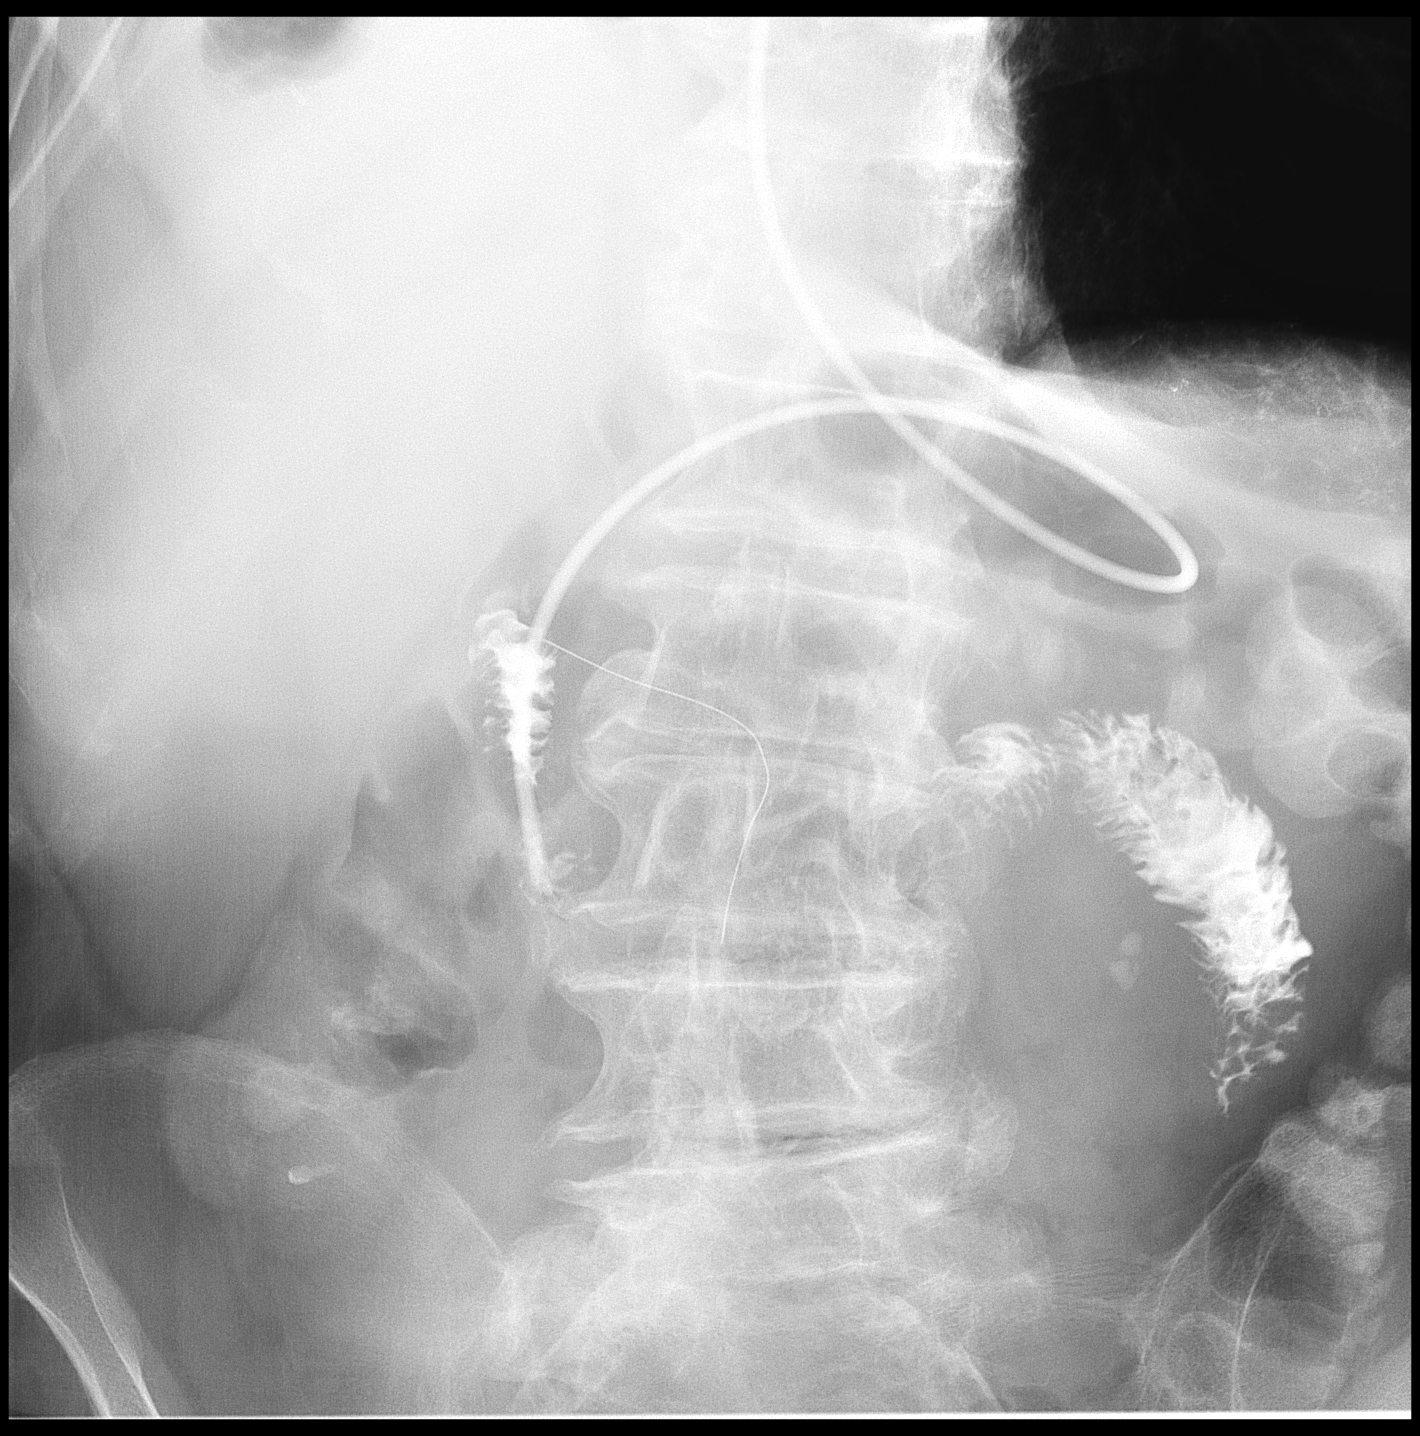

[1 of 1 positions shown; findings below may reference images not displayed]

FINDINGS: Partially visualized feeding tube with tip in the distal second
portion of the duodenum. Contrast injected through the tube
opacifies the distal duodenum and proximal jejunum. Several calcific
density over the left renal silhouette, likely renal stones. There
is atherosclerotic calcification of the aorta. Degenerative changes
of the spine.
IMPRESSION: Partially visualized feeding tube with tip at the junction of the
second and third portion of the duodenum.

## 2020-03-17 ENCOUNTER — Encounter
Payer: No Typology Code available for payment source | Attending: Physical Medicine & Rehabilitation | Admitting: Physical Medicine & Rehabilitation

## 2020-03-17 ENCOUNTER — Other Ambulatory Visit: Payer: Self-pay

## 2020-03-17 ENCOUNTER — Encounter: Payer: Self-pay | Admitting: Physical Medicine & Rehabilitation

## 2020-03-17 VITALS — BP 154/77 | HR 53 | Temp 98.2°F | Ht 70.0 in | Wt 169.4 lb

## 2020-03-17 DIAGNOSIS — R252 Cramp and spasm: Secondary | ICD-10-CM | POA: Diagnosis present

## 2020-03-17 DIAGNOSIS — M5441 Lumbago with sciatica, right side: Secondary | ICD-10-CM | POA: Diagnosis present

## 2020-03-17 DIAGNOSIS — G8929 Other chronic pain: Secondary | ICD-10-CM

## 2020-03-17 DIAGNOSIS — M47819 Spondylosis without myelopathy or radiculopathy, site unspecified: Secondary | ICD-10-CM | POA: Diagnosis not present

## 2020-03-17 DIAGNOSIS — I739 Peripheral vascular disease, unspecified: Secondary | ICD-10-CM

## 2020-03-17 DIAGNOSIS — M5442 Lumbago with sciatica, left side: Secondary | ICD-10-CM

## 2020-03-17 DIAGNOSIS — R269 Unspecified abnormalities of gait and mobility: Secondary | ICD-10-CM

## 2020-03-17 NOTE — Progress Notes (Signed)
Subjective:    Patient ID: William Kirk, male    DOB: October 07, 1940, 80 y.o.   MRN: [id number]  HPI Male with pmh/psh of PVD, HTN, depression, COPD, gastric ulcer, OA, anxiety, VATS, right THA, right meniscal tear repair presents with leg > back pain. Wife provides history. CTS notes reviewed for VATS.  Pain started 12/2019. Stable. Denies inciting event.  He received a cream (helped), OTC lidocaine patch, heat helps.  Ambulation exacerbates the pain, for which he sits for a little while and is able to ambulate again.  Pin/needles in legs.  Radiates to calfs/feet. Intermittent.  Denies falls. Denies associated weakness/numbness.  Pain limits ambulation. He sees Vascular next month. He notes easier to go down stairs.   Pain Inventory Average Pain 5 Pain Right Now 4 My pain is intermittent, sharp and aching  In the last 24 hours, has pain interfered with the following? General activity 5 Relation with others 0 Enjoyment of life 5 What TIME of day is your pain at its worst? daytime Sleep (in general) Good  Pain is worse with: walking, inactivity, standing and some activites Pain improves with: rest, heat/ice, pacing activities and medication Relief from Meds: 5  walk without assistance how many minutes can you walk? 10 mins ability to climb steps?  yes do you drive?  yes  I need assistance with the following:  household duties and shopping  numbness tingling trouble walking depression anxiety  Any changes since last visit?  no New Patient  Any changes since last visit?  no New Patient    Family History  Problem Relation Age of Onset  . Cancer Father 30       Brain  . Dementia Mother    Social History   Socioeconomic History  . Marital status: Married    Spouse name: Not on file  . Number of children: Not on file  . Years of education: 69  . Highest education level: Not on file  Occupational History  . Occupation: Patent examiner  Tobacco Use  . Smoking  status: Former Smoker    Packs/day: 1.00    Years: 55.00    Pack years: 55.00    Types: Cigarettes    Quit date: 04/2015    Years since quitting: 4.9  . Smokeless tobacco: Never Used  Vaping Use  . Vaping Use: Never used  Substance and Sexual Activity  . Alcohol use: No    Alcohol/week: 0.0 standard drinks  . Drug use: No  . Sexual activity: Not Currently    Partners: Female  Other Topics Concern  . Not on file  Social History Narrative  . Not on file   Social Determinants of Health   Financial Resource Strain: Not on file  Food Insecurity: Not on file  Transportation Needs: Not on file  Physical Activity: Not on file  Stress: Not on file  Social Connections: Not on file   Past Surgical History:  Procedure Laterality Date  . IR ANGIOGRAM SELECTIVE EACH ADDITIONAL VESSEL  07/26/2018  . IR ANGIOGRAM SELECTIVE EACH ADDITIONAL VESSEL  07/26/2018  . IR ANGIOGRAM SELECTIVE EACH ADDITIONAL VESSEL  07/26/2018  . IR EMBO ART  VEN HEMORR LYMPH EXTRAV  INC GUIDE ROADMAPPING  07/26/2018  . IR GASTROSTOMY TUBE MOD SED  12/14/2018  . IR US GUIDE VASC ACCESS RIGHT  07/26/2018  . JOINT REPLACEMENT    . KNEE ARTHROSCOPY Right X 1  . TOTAL HIP ARTHROPLASTY Right 2009  . VIDEO ASSISTED THORACOSCOPY (VATS)/  LOBECTOMY Right 10/18/2018   Procedure: VIDEO ASSISTED THORACOSCOPY (VATS)/ right lower LOBECTOMY;  Surgeon: Loreli Slot, MD;  Location: [facility] OR;  Service: Thoracic;  Laterality: Right;  Marland Kitchen VIDEO ASSISTED THORACOSCOPY (VATS)/DECORTICATION Right 11/09/2018   Procedure: REDO VIDEO ASSISTED THORACOSCOPY (VATS)/DECORTICATION/DRAIN EFFUSION;  Surgeon: Loreli Slot, MD;  Location: [facility] OR;  Service: Thoracic;  Laterality: Right;  Marland Kitchen VIDEO BRONCHOSCOPY N/A 07/27/2018   Procedure: VIDEO BRONCHOSCOPY WITHOUT FLUORO;  Surgeon: Leslye Peer, MD;  Location: [facility] OR;  Service: Cardiopulmonary;  Laterality: N/A;  . VIDEO BRONCHOSCOPY N/A 11/09/2018   Procedure: VIDEO BRONCHOSCOPY;  Surgeon:  Loreli Slot, MD;  Location: [facility] OR;  Service: Thoracic;  Laterality: N/A;   Past Medical History:  Diagnosis Date  . Anxiety   . Arthritis    "hands" (09/21/2016)  . Bleeding stomach ulcer 2000s  . COPD (chronic obstructive pulmonary disease) (HCC)   . Cough   . Depression   . Dyspnea   . Hypertension   . Pneumonia    "now and several times before this" (09/21/2016)  . PVD (peripheral vascular disease) (HCC) 2014   prev PTCA on RLE   BP (!) 154/77   Pulse (!) 53   Temp 98.2 F (36.8 C)   Ht 5\' 10"  (1.778 m)   Wt 169 lb 6.4 oz (76.8 kg)   BMI 24.31 kg/m   Opioid Risk Score:   Fall Risk Score:  `1  Depression screen PHQ 2/9  Depression screen [facility] 2/9 03/17/2020 04/15/2015  Decreased Interest 1 1  Down, Depressed, Hopeless 0 0  PHQ - 2 Score 1 1  Altered sleeping 0 -  Tired, decreased energy 2 -  Change in appetite 0 -  Feeling bad or failure about yourself  0 -  Trouble concentrating 0 -  Moving slowly or fidgety/restless 0 -  Suicidal thoughts 0 -  PHQ-9 Score 3 -   Review of Systems  Respiratory: Positive for cough and shortness of breath.        Copd  Musculoskeletal: Positive for arthralgias, back pain, gait problem and myalgias.       Pain in left and right legs  Neurological: Negative for weakness and numbness.  Hematological: Bruises/bleeds easily.  All other systems reviewed and are negative.      Objective:   Physical Exam  Constitutional: No distress . Vital signs reviewed. HENT: Normocephalic.  Atraumatic. Eyes: EOMI. No discharge. Cardiovascular: No JVD. Respiratory: Normal effort.  No stridor.   GI: Non-distended.   Skin: Warm and dry.  Intact. Psych: Normal mood.  Normal behavior. Musc: No edema in extremities.  No tenderness in extremities. No tenderness in back  Gait: Kyphotic and slightly wide based Neuro: Alert Reflexes 2+ Motor: 5/5 b/l LE Sensation intact to light touch B/l LE Vibration: Absent in foot, L>R Proprioception:  Limited in LLE    Assessment & Plan:  Male with pmh of PVD, HTN, depression, COPD, gastric ulcer, OA, anxiety presents with back and leg pain.   1. Chronic leg > back pain - mutlifactorial: PVD +/- facet arthropathy +/- radiculopathy  MRI from 12/2019 revealing scoliosis and multilevel lumbar spondylosis. L2-3 foraminal stenosis, L3-4 facet arthropathy, foraminal narrowing,L4-5 foraminal narrowing. L5-S1 foraminal narrowing, all bilaterally.   Labs reviewed from 2020, encouraged to bring in recent labs  Chart/Referral information reviewed  PMAWARE reviewed  UDS performed  Will avoid NSAIDs due to comorbidities  Will consider PT with trial TENS  Will consider Bracing  Continue Voltaren gel  Will increase Gabapentin to 300 BID  Will consider Cymbalta  Will order Robaxin 500 BID PRN  Patient states main goal is to ambulate  Will consider ESIs  Follow up with Vascular Surg  Will order CMP, Vit B, Vit D, Mag   2. Gait abnormality  Cont cane prn

## 2020-04-16 ENCOUNTER — Other Ambulatory Visit: Payer: Self-pay

## 2020-04-16 ENCOUNTER — Encounter: Payer: Self-pay | Admitting: Physical Medicine & Rehabilitation

## 2020-04-16 ENCOUNTER — Encounter
Payer: No Typology Code available for payment source | Attending: Physical Medicine & Rehabilitation | Admitting: Physical Medicine & Rehabilitation

## 2020-04-16 VITALS — BP 142/64 | HR 62 | Temp 97.5°F | Ht 70.0 in | Wt 170.4 lb

## 2020-04-16 DIAGNOSIS — R252 Cramp and spasm: Secondary | ICD-10-CM | POA: Diagnosis not present

## 2020-04-16 DIAGNOSIS — G8929 Other chronic pain: Secondary | ICD-10-CM | POA: Insufficient documentation

## 2020-04-16 DIAGNOSIS — R269 Unspecified abnormalities of gait and mobility: Secondary | ICD-10-CM | POA: Insufficient documentation

## 2020-04-16 DIAGNOSIS — M5442 Lumbago with sciatica, left side: Secondary | ICD-10-CM | POA: Insufficient documentation

## 2020-04-16 DIAGNOSIS — M47819 Spondylosis without myelopathy or radiculopathy, site unspecified: Secondary | ICD-10-CM | POA: Diagnosis present

## 2020-04-16 DIAGNOSIS — M5441 Lumbago with sciatica, right side: Secondary | ICD-10-CM | POA: Diagnosis present

## 2020-04-16 DIAGNOSIS — I739 Peripheral vascular disease, unspecified: Secondary | ICD-10-CM | POA: Insufficient documentation

## 2020-04-16 MED ORDER — GABAPENTIN 300 MG PO CAPS: 300.0000 mg | ORAL_CAPSULE | Freq: Three times a day (TID) | ORAL | 1 refills | Status: DC

## 2020-04-16 MED ORDER — METHOCARBAMOL 500 MG PO TABS: 500.0000 mg | ORAL_TABLET | Freq: Two times a day (BID) | ORAL | 1 refills | Status: AC | PRN

## 2020-04-16 NOTE — Progress Notes (Signed)
Subjective:    Patient ID: William Kirk, male    DOB: [birth year], 80 y.o.   MRN: [id number]  HPI Male with pmh/psh of PVD, HTN, depression, COPD, gastric ulcer, OA, anxiety, VATS, right THA, right meniscal tear repair presents with right leg > back pain.  Initially stated: Wife provides history. CTS notes reviewed for VATS.  Pain started 12/2019. Stable. Denies inciting event.  He received a cream (helped), OTC lidocaine patch, heat helps.  Ambulation exacerbates the pain, for which he sits for a little while and is able to ambulate again.  Pin/needles in legs.  Radiates to calfs/feet. Intermittent.  Denies falls. Denies associated weakness/numbness.  Pain limits ambulation. He sees Vascular next month. He notes easier to go down stairs.   Last clinic visit on 03/17/2020.  Wife supplements history.  Since that time, lab was requested to be sent here.  He had some benefit with increase in Gabapentin, but was not able to continue due to lack of [facility] refilling at higher dose. He states he did not like the way Robaxin and Gabapentin together made him feel, so he stopped taking it. Lab work not obtained.  Denies falls.   Pain Inventory Average Pain 6 Pain Right Now 6 My pain is intermittent, burning, stabbing and aching  In the last 24 hours, has pain interfered with the following? General activity 6 Relation with others 4 Enjoyment of life 6 What TIME of day is your pain at its worst? daytime Sleep (in general) Good  Pain is worse with: walking, standing and some activites Pain improves with: rest, heat/ice and medication Relief from Meds: 4    Family History  Problem Relation Age of Onset  . Cancer Father 33       Brain  . Dementia Mother    Social History   Socioeconomic History  . Marital status: Married    Spouse name: Not on file  . Number of children: Not on file  . Years of education: 94  . Highest education level: Not on file  Occupational History  . Occupation: Public house manager  Tobacco Use  . Smoking status: Former Smoker    Packs/day: 1.00    Years: 55.00    Pack years: 55.00    Types: Cigarettes    Quit date: 04/2015    Years since quitting: 5.0  . Smokeless tobacco: Never Used  Vaping Use  . Vaping Use: Never used  Substance and Sexual Activity  . Alcohol use: No    Alcohol/week: 0.0 standard drinks  . Drug use: No  . Sexual activity: Not Currently    Partners: Female  Other Topics Concern  . Not on file  Social History Narrative  . Not on file   Social Determinants of Health   Financial Resource Strain: Not on file  Food Insecurity: Not on file  Transportation Needs: Not on file  Physical Activity: Not on file  Stress: Not on file  Social Connections: Not on file   Past Surgical History:  Procedure Laterality Date  . IR ANGIOGRAM SELECTIVE EACH ADDITIONAL VESSEL  07/26/2018  . IR ANGIOGRAM SELECTIVE EACH ADDITIONAL VESSEL  07/26/2018  . IR ANGIOGRAM SELECTIVE EACH ADDITIONAL VESSEL  07/26/2018  . IR EMBO ART  VEN HEMORR LYMPH EXTRAV  INC GUIDE ROADMAPPING  07/26/2018  . IR GASTROSTOMY TUBE MOD SED  12/14/2018  . IR US GUIDE VASC ACCESS RIGHT  07/26/2018  . JOINT REPLACEMENT    . KNEE ARTHROSCOPY Right X 1  .  TOTAL HIP ARTHROPLASTY Right 2009  . VIDEO ASSISTED THORACOSCOPY (VATS)/ LOBECTOMY Right 10/18/2018   Procedure: VIDEO ASSISTED THORACOSCOPY (VATS)/ right lower LOBECTOMY;  Surgeon: Loreli Slot, MD;  Location: [facility] OR;  Service: Thoracic;  Laterality: Right;  Marland Kitchen VIDEO ASSISTED THORACOSCOPY (VATS)/DECORTICATION Right 11/09/2018   Procedure: REDO VIDEO ASSISTED THORACOSCOPY (VATS)/DECORTICATION/DRAIN EFFUSION;  Surgeon: Loreli Slot, MD;  Location: [facility] OR;  Service: Thoracic;  Laterality: Right;  Marland Kitchen VIDEO BRONCHOSCOPY N/A 07/27/2018   Procedure: VIDEO BRONCHOSCOPY WITHOUT FLUORO;  Surgeon: Leslye Peer, MD;  Location: [facility] OR;  Service: Cardiopulmonary;  Laterality: N/A;  . VIDEO BRONCHOSCOPY N/A 11/09/2018    Procedure: VIDEO BRONCHOSCOPY;  Surgeon: Loreli Slot, MD;  Location: [facility] OR;  Service: Thoracic;  Laterality: N/A;   Past Medical History:  Diagnosis Date  . Anxiety   . Arthritis    "hands" (09/21/2016)  . Bleeding stomach ulcer 2000s  . COPD (chronic obstructive pulmonary disease) (HCC)   . Cough   . Depression   . Dyspnea   . Hypertension   . Pneumonia    "now and several times before this" (09/21/2016)  . PVD (peripheral vascular disease) (HCC) 2014   prev PTCA on RLE   BP (!) 142/64   Pulse 62   Temp (!) 97.5 F (36.4 C)   Ht 5\' 10"  (1.778 m)   Wt 170 lb 6.4 oz (77.3 kg)   SpO2 97%   BMI 24.45 kg/m   Opioid Risk Score:   Fall Risk Score:  `1  Depression screen PHQ 2/9  Depression screen [facility] 2/9 03/17/2020 04/15/2015  Decreased Interest 1 1  Down, Depressed, Hopeless 0 0  PHQ - 2 Score 1 1  Altered sleeping 0 -  Tired, decreased energy 2 -  Change in appetite 0 -  Feeling bad or failure about yourself  0 -  Trouble concentrating 0 -  Moving slowly or fidgety/restless 0 -  Suicidal thoughts 0 -  PHQ-9 Score 3 -   Review of Systems  Respiratory: Positive for cough and shortness of breath.        Copd  Musculoskeletal: Positive for arthralgias, back pain, gait problem and myalgias.       Pain in left and right legs  Neurological: Negative for weakness and numbness.  Hematological: Bruises/bleeds easily.  All other systems reviewed and are negative.     Objective:   Physical Exam  Constitutional: No distress . Vital signs reviewed. HENT: Normocephalic.  Atraumatic. Eyes: EOMI. No discharge. Cardiovascular: No JVD.   Respiratory: Normal effort.  No stridor.   GI: Non-distended.   Skin: Warm and dry.  Intact. Psych: Normal mood.  Normal behavior. Musc: No tenderness in back  Gait: Kyphotic and slightly wide based Neuro: Alert Motor: 5/5 b/l LE Sensation intact to light touch B/l LE Vibration: Absent in foot, L>R Proprioception: Limited in LLE     Assessment & Plan:  Male with pmh of PVD, HTN, depression, COPD, gastric ulcer, OA, anxiety presents with back and leg pain.   1. Chronic right leg (predominantly in calf) > back pain - mutlifactorial: PVD +/- facet arthropathy +/- radiculopathy  MRI from 12/2019 revealing scoliosis and multilevel lumbar spondylosis. L2-3 foraminal stenosis, L3-4 facet arthropathy, foraminal narrowing,L4-5 foraminal narrowing. L5-S1 foraminal narrowing, all bilaterally.   Labs reviewed from 2020, encouraged to bring in recent labs  Will avoid NSAIDs due to comorbidities  Will consider PT with trial TENS  Will consider Bracing  Continue Voltaren gel  Will  increase Gabapentin to 300 TID  Will consider Cymbalta  Continue Robaxin 500 BID PRN, avoid taking together with Gabapentin  Patient states main goal is to ambulate  Will consider ESIs  Follow up with Vascular Surg, imaging planned  Will order CMP, Vit B, Vit D, Mag, awaiting   2. Gait abnormality  Cont cane prn

## 2020-05-18 ENCOUNTER — Other Ambulatory Visit (HOSPITAL_COMMUNITY): Payer: Self-pay | Admitting: *Deleted

## 2020-05-18 DIAGNOSIS — R1310 Dysphagia, unspecified: Secondary | ICD-10-CM

## 2020-05-25 ENCOUNTER — Ambulatory Visit: Payer: No Typology Code available for payment source | Attending: Internal Medicine

## 2020-05-25 DIAGNOSIS — Z23 Encounter for immunization: Secondary | ICD-10-CM

## 2020-05-25 NOTE — Progress Notes (Signed)
Covid-19 Vaccination Clinic  Name:  William Kirk    MRN: [id number] DOB: 12-18-1940  05/25/2020  [name] was observed post Covid-19 immunization for 15 minutes without incident. He was provided with Vaccine Information Sheet and instruction to access the V-Safe system.   [name] was instructed to call 911 with any severe reactions post vaccine: Marland Kitchen Difficulty breathing  . Swelling of face and throat  . A fast heartbeat  . A bad rash all over body  . Dizziness and weakness   Immunizations Administered    Name Date Dose VIS Date Route   Moderna Covid-19 Booster Vaccine 05/25/2020 12:51 PM 0.25 mL 11/27/2019 Intramuscular   Manufacturer: Moderna   Lot: [id number]   NDC: 86578-469-62

## 2020-05-28 ENCOUNTER — Other Ambulatory Visit (HOSPITAL_BASED_OUTPATIENT_CLINIC_OR_DEPARTMENT_OTHER): Payer: Self-pay

## 2020-05-28 MED ORDER — MODERNA COVID-19 VACCINE 100 MCG/0.5ML IM SUSP
INTRAMUSCULAR | 0 refills | Status: DC
  Filled 2020-05-28: qty 0.25, 1d supply, fill #0

## 2020-05-29 ENCOUNTER — Ambulatory Visit (HOSPITAL_COMMUNITY)
Admission: RE | Admit: 2020-05-29 | Discharge: 2020-05-29 | Disposition: A | Discharge status: Home / Self Care | Payer: No Typology Code available for payment source | Source: Ambulatory Visit | Attending: Neurology | Admitting: Neurology

## 2020-05-29 ENCOUNTER — Other Ambulatory Visit: Payer: Self-pay

## 2020-05-29 ENCOUNTER — Other Ambulatory Visit (HOSPITAL_COMMUNITY): Payer: Self-pay | Admitting: Neurology

## 2020-05-29 ENCOUNTER — Other Ambulatory Visit (HOSPITAL_BASED_OUTPATIENT_CLINIC_OR_DEPARTMENT_OTHER): Payer: Self-pay

## 2020-05-29 DIAGNOSIS — R1310 Dysphagia, unspecified: Secondary | ICD-10-CM

## 2020-06-04 ENCOUNTER — Other Ambulatory Visit: Payer: Self-pay

## 2020-06-04 ENCOUNTER — Encounter
Payer: No Typology Code available for payment source | Attending: Physical Medicine & Rehabilitation | Admitting: Physical Medicine & Rehabilitation

## 2020-06-04 ENCOUNTER — Encounter: Payer: Self-pay | Admitting: Physical Medicine & Rehabilitation

## 2020-06-04 VITALS — BP 144/87 | HR 50 | Temp 97.5°F | Ht 70.0 in | Wt 169.0 lb

## 2020-06-04 DIAGNOSIS — M47819 Spondylosis without myelopathy or radiculopathy, site unspecified: Secondary | ICD-10-CM

## 2020-06-04 DIAGNOSIS — G8929 Other chronic pain: Secondary | ICD-10-CM | POA: Diagnosis present

## 2020-06-04 DIAGNOSIS — I739 Peripheral vascular disease, unspecified: Secondary | ICD-10-CM

## 2020-06-04 DIAGNOSIS — R252 Cramp and spasm: Secondary | ICD-10-CM

## 2020-06-04 DIAGNOSIS — M5442 Lumbago with sciatica, left side: Secondary | ICD-10-CM | POA: Insufficient documentation

## 2020-06-04 DIAGNOSIS — R269 Unspecified abnormalities of gait and mobility: Secondary | ICD-10-CM | POA: Diagnosis present

## 2020-06-04 DIAGNOSIS — M5441 Lumbago with sciatica, right side: Secondary | ICD-10-CM | POA: Diagnosis present

## 2020-06-04 MED ORDER — GABAPENTIN 600 MG PO TABS: 600.0000 mg | ORAL_TABLET | Freq: Three times a day (TID) | ORAL | 2 refills | Status: DC

## 2020-06-04 NOTE — Progress Notes (Signed)
Subjective:    Patient ID: William Kirk, male    DOB: [birth year], 80 y.o.   MRN: [id number]  HPI Male with pmh/psh of PVD, HTN, depression, COPD, gastric ulcer, OA, anxiety, VATS, right THA, right meniscal tear repair presents with right leg > back pain.  Initially stated: Wife provides history. CTS notes reviewed for VATS.  Pain started 12/2019. Stable. Denies inciting event.  He received a cream (helped), OTC lidocaine patch, heat helps.  Ambulation exacerbates the pain, for which he sits for a little while and is able to ambulate again.  Pin/needles in legs.  Radiates to calfs/feet. Intermittent.  Denies falls. Denies associated weakness/numbness.  Pain limits ambulation. He sees Vascular next month. He notes easier to go down stairs.   Wife supplements history. Last clinic visit on 04/16/20. Since that time, pt did not bring in lab work. He notes improvement with Gabapentin increase. Denies falls. He sees Vascular next month. Back pain has improved.   Pain Inventory Average Pain 7 Pain Right Now 7 My pain is intermittent, burning, stabbing and aching  In the last 24 hours, has pain interfered with the following? General activity 7 Relation with others 7 Enjoyment of life 7 What TIME of day is your pain at its worst? daytime Sleep (in general) Fair  Pain is worse with: walking and some activites Pain improves with: rest, heat/ice and medication Relief from Meds: 5    Family History  Problem Relation Age of Onset  . Cancer Father 48       Brain  . Dementia Mother    Social History   Socioeconomic History  . Marital status: Married    Spouse name: Not on file  . Number of children: Not on file  . Years of education: 76  . Highest education level: Not on file  Occupational History  . Occupation: Patent examiner  Tobacco Use  . Smoking status: Former Smoker    Packs/day: 1.00    Years: 55.00    Pack years: 55.00    Types: Cigarettes    Quit date: 04/2015     Years since quitting: 5.1  . Smokeless tobacco: Never Used  Vaping Use  . Vaping Use: Never used  Substance and Sexual Activity  . Alcohol use: No    Alcohol/week: 0.0 standard drinks  . Drug use: No  . Sexual activity: Not Currently    Partners: Female  Other Topics Concern  . Not on file  Social History Narrative  . Not on file   Social Determinants of Health   Financial Resource Strain: Not on file  Food Insecurity: Not on file  Transportation Needs: Not on file  Physical Activity: Not on file  Stress: Not on file  Social Connections: Not on file   Past Surgical History:  Procedure Laterality Date  . IR ANGIOGRAM SELECTIVE EACH ADDITIONAL VESSEL  07/26/2018  . IR ANGIOGRAM SELECTIVE EACH ADDITIONAL VESSEL  07/26/2018  . IR ANGIOGRAM SELECTIVE EACH ADDITIONAL VESSEL  07/26/2018  . IR EMBO ART  VEN HEMORR LYMPH EXTRAV  INC GUIDE ROADMAPPING  07/26/2018  . IR GASTROSTOMY TUBE MOD SED  12/14/2018  . IR US GUIDE VASC ACCESS RIGHT  07/26/2018  . JOINT REPLACEMENT    . KNEE ARTHROSCOPY Right X 1  . TOTAL HIP ARTHROPLASTY Right 2009  . VIDEO ASSISTED THORACOSCOPY (VATS)/ LOBECTOMY Right 10/18/2018   Procedure: VIDEO ASSISTED THORACOSCOPY (VATS)/ right lower LOBECTOMY;  Surgeon: Loreli Slot, MD;  Location: [facility] OR;  Service: Thoracic;  Laterality: Right;  Marland Kitchen VIDEO ASSISTED THORACOSCOPY (VATS)/DECORTICATION Right 11/09/2018   Procedure: REDO VIDEO ASSISTED THORACOSCOPY (VATS)/DECORTICATION/DRAIN EFFUSION;  Surgeon: Loreli Slot, MD;  Location: [facility] OR;  Service: Thoracic;  Laterality: Right;  Marland Kitchen VIDEO BRONCHOSCOPY N/A 07/27/2018   Procedure: VIDEO BRONCHOSCOPY WITHOUT FLUORO;  Surgeon: Leslye Peer, MD;  Location: [facility] OR;  Service: Cardiopulmonary;  Laterality: N/A;  . VIDEO BRONCHOSCOPY N/A 11/09/2018   Procedure: VIDEO BRONCHOSCOPY;  Surgeon: Loreli Slot, MD;  Location: [facility] OR;  Service: Thoracic;  Laterality: N/A;   Past Medical History:  Diagnosis Date  .  Anxiety   . Arthritis    "hands" (09/21/2016)  . Bleeding stomach ulcer 2000s  . COPD (chronic obstructive pulmonary disease) (HCC)   . Cough   . Depression   . Dyspnea   . Hypertension   . Pneumonia    "now and several times before this" (09/21/2016)  . PVD (peripheral vascular disease) (HCC) 2014   prev PTCA on RLE   BP (!) 144/87   Pulse (!) 50   Temp (!) 97.5 F (36.4 C)   Ht 5\' 10"  (1.778 m)   Wt 169 lb (76.7 kg)   SpO2 95%   BMI 24.25 kg/m   Opioid Risk Score:   Fall Risk Score:  `1  Depression screen PHQ 2/9  Depression screen [facility] 2/9 03/17/2020 04/15/2015  Decreased Interest 1 1  Down, Depressed, Hopeless 0 0  PHQ - 2 Score 1 1  Altered sleeping 0 -  Tired, decreased energy 2 -  Change in appetite 0 -  Feeling bad or failure about yourself  0 -  Trouble concentrating 0 -  Moving slowly or fidgety/restless 0 -  Suicidal thoughts 0 -  PHQ-9 Score 3 -   Review of Systems  Constitutional: Negative.   HENT: Negative.   Eyes: Negative.   Respiratory: Negative.        Copd  Cardiovascular: Negative.   Gastrointestinal: Negative.   Endocrine: Negative.   Genitourinary: Negative.   Musculoskeletal: Positive for arthralgias, back pain, gait problem and myalgias.       Pain in left and right legs  Skin: Negative.   Allergic/Immunologic: Negative.   Hematological: Bruises/bleeds easily.  All other systems reviewed and are negative.     Objective:   Physical Exam  Constitutional: No distress . Vital signs reviewed. HENT: Normocephalic.  Atraumatic. Eyes: EOMI. No discharge. Cardiovascular: No JVD.   Respiratory: Normal effort.  No stridor.   GI: Non-distended.   Skin: Warm and dry.  Intact. Psych: Normal mood.  Normal behavior. Musc: No edema in extremities.  No tenderness in extremities. No tenderness in back  Gait: Kyphotic and slightly wide based, unchanged Neuro: Alert Motor: 5/5 b/l LE Sensation intact to light touch B/l LE Vibration: Absent in  foot, L>R previously Proprioception: Limited in LLE previously    Assessment & Plan:  Male with pmh of PVD, HTN, depression, COPD, gastric ulcer, OA, anxiety presents with back and leg pain.   1. Chronic right leg (predominantly in calf) > back pain - mutlifactorial: PVD +/- facet arthropathy +/- radiculopathy  MRI from 12/2019 revealing scoliosis and multilevel lumbar spondylosis. L2-3 foraminal stenosis, L3-4 facet arthropathy, foraminal narrowing,L4-5 foraminal narrowing. L5-S1 foraminal narrowing, all bilaterally.   Labs reviewed from 2020, encouraged to bring in recent labs  Will avoid NSAIDs due to comorbidities  Will consider PT with trial TENS after Vascular appointment  Will consider Bracing  Continue Voltaren gel  Will increase Gabapentin to 600 TID  Will consider Cymbalta  Continue Robaxin 500 BID PRN, avoid taking together with Gabapentin  Patient states main goal is to ambulate  Will consider ESIs  Follow up with Vascular Surg, imaging planned, awaiting appointment  Ordered CMP, Vit B, Vit D, Mag, awaiting    2. Gait abnormality  Continue cane prn

## 2020-06-06 ENCOUNTER — Other Ambulatory Visit: Payer: Self-pay

## 2020-06-06 DIAGNOSIS — I739 Peripheral vascular disease, unspecified: Secondary | ICD-10-CM

## 2020-06-16 ENCOUNTER — Other Ambulatory Visit: Payer: Self-pay

## 2020-06-16 ENCOUNTER — Ambulatory Visit (INDEPENDENT_AMBULATORY_CARE_PROVIDER_SITE_OTHER): Payer: No Typology Code available for payment source | Admitting: Vascular Surgery

## 2020-06-16 ENCOUNTER — Ambulatory Visit (HOSPITAL_COMMUNITY)
Admission: RE | Admit: 2020-06-16 | Discharge: 2020-06-16 | Disposition: A | Discharge status: Home / Self Care | Payer: No Typology Code available for payment source | Source: Ambulatory Visit | Attending: Vascular Surgery | Admitting: Vascular Surgery

## 2020-06-16 ENCOUNTER — Encounter: Payer: Self-pay | Admitting: Vascular Surgery

## 2020-06-16 VITALS — BP 143/67 | HR 52 | Temp 97.9°F | Resp 20 | Ht 70.0 in | Wt 171.9 lb

## 2020-06-16 DIAGNOSIS — I6523 Occlusion and stenosis of bilateral carotid arteries: Secondary | ICD-10-CM | POA: Diagnosis not present

## 2020-06-16 DIAGNOSIS — I739 Peripheral vascular disease, unspecified: Secondary | ICD-10-CM | POA: Diagnosis not present

## 2020-06-16 NOTE — Progress Notes (Signed)
[facility] OF [place]  ASSESSMENT / PLAN: William Kirk is a 80 y.o. 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  Patient counseled patients with asymptomatic peripheral arterial disease or claudication have a 1-2% risk of developing chronic limb threatening ischemia, but a 15-30% risk of mortality in the next 5 years. Intervention should only be considered for medically optimized patients with disabling symptoms. .  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 Not yet at threshold to consider prophylactic repair.  The patient should continue best medical therapy for atherosclerosis including: Complete cessation from all tobacco products. Blood glucose control with goal A1c < 7%. Blood pressure control with goal blood pressure < 140/90 mmHg. Lipid reduction therapy with goal LDL-C <100 mg/dL (<70 if symptomatic from PAD).  Aspirin 81mg  PO QD.  Atorvastatin 40-80mg  PO QD (or other "high intensity" statin therapy). Adequate hydration (at least 2 liters / day) if patient's heart and kidney function is adequate. Daily walking to and past the point of discomfort. Patient counseled to keep a log of exercise distance.  Follow up with me in 6 months with ABI and carotid duplex.   CHIEF COMPLAINT: intermittent claudication  HISTORY OF PRESENT ILLNESS: William Kirk is a 80 y.o. male referred to clinic for evaluation of left lower extremity intermittent claudication.  He is a [place] patient and has undergone ABI and carotid artery duplex with his care team at the [place].  He reports he can walk about a half block before typical calf cramping begins.  This improves with rest.  He specifically denies symptoms typical of ischemic rest pain.  He has no ulcerations about his foot.  He is able to function with his claudication.  He was able to mow part of his yard this past weekend.  He has previously undergone  percutaneous intervention in his right lower extremity but was not able to give much detail.   Past Medical History:  Diagnosis Date  . Anxiety   . Arthritis    "hands" (09/21/2016)  . Bleeding stomach ulcer 2000s  . COPD (chronic obstructive pulmonary disease) ([place])   . Cough   . Depression   . Dyspnea   . Hypertension   . Pneumonia    "now and several times before this" (09/21/2016)  . PVD (peripheral vascular disease) ([place]) 2014   prev PTCA on RLE    Past Surgical History:  Procedure Laterality Date  . IR ANGIOGRAM SELECTIVE EACH ADDITIONAL VESSEL  07/26/2018  . IR ANGIOGRAM SELECTIVE EACH ADDITIONAL VESSEL  07/26/2018  . IR ANGIOGRAM SELECTIVE EACH ADDITIONAL VESSEL  07/26/2018  . IR EMBO ART  VEN HEMORR LYMPH EXTRAV  INC GUIDE ROADMAPPING  07/26/2018  . IR GASTROSTOMY TUBE MOD SED  12/14/2018  . IR US GUIDE VASC ACCESS RIGHT  07/26/2018  . JOINT REPLACEMENT    . KNEE ARTHROSCOPY Right X 1  . TOTAL HIP ARTHROPLASTY Right 2009  . VIDEO ASSISTED THORACOSCOPY (VATS)/ LOBECTOMY Right 10/18/2018   Procedure: VIDEO ASSISTED THORACOSCOPY (VATS)/ right lower LOBECTOMY;  Surgeon: Melrose Nakayama, MD;  Location: [facility];  Service: Thoracic;  Laterality: Right;  Marland Kitchen VIDEO ASSISTED THORACOSCOPY (VATS)/DECORTICATION Right 11/09/2018   Procedure: REDO VIDEO ASSISTED THORACOSCOPY (VATS)/DECORTICATION/DRAIN EFFUSION;  Surgeon: Melrose Nakayama, MD;  Location: [facility];  Service: Thoracic;  Laterality: Right;  Marland Kitchen VIDEO BRONCHOSCOPY N/A 07/27/2018   Procedure: VIDEO BRONCHOSCOPY WITHOUT FLUORO;  Surgeon: Collene Gobble, MD;  Location: [facility] OR;  Service: Cardiopulmonary;  Laterality: N/A;  . VIDEO BRONCHOSCOPY N/A 11/09/2018   Procedure: VIDEO BRONCHOSCOPY;  Surgeon: Melrose Nakayama, MD;  Location: [facility];  Service: Thoracic;  Laterality: N/A;    Family History  Problem Relation Age of Onset  . Cancer Father 68       Brain  . Dementia Mother     Social History   Socioeconomic History   . Marital status: Married    Spouse name: Not on file  . Number of children: Not on file  . Years of education: 31  . Highest education level: Not on file  Occupational History  . Occupation: Event organiser  Tobacco Use  . Smoking status: Former Smoker    Packs/day: 1.00    Years: 55.00    Pack years: 55.00    Types: Cigarettes    Quit date: 04/2015    Years since quitting: 5.1  . Smokeless tobacco: Never Used  Vaping Use  . Vaping Use: Never used  Substance and Sexual Activity  . Alcohol use: No    Alcohol/week: 0.0 standard drinks  . Drug use: No  . Sexual activity: Not Currently    Partners: Female  Other Topics Concern  . Not on file  Social History Narrative  . Not on file   Social Determinants of Health   Financial Resource Strain: Not on file  Food Insecurity: Not on file  Transportation Needs: Not on file  Physical Activity: Not on file  Stress: Not on file  Social Connections: Not on file  Intimate Partner Violence: Not on file    Allergies  Allergen Reactions  . Testosterone Rash    Current Outpatient Medications  Medication Sig Dispense Refill  . acetaminophen (TYLENOL) 500 MG tablet TAKE ONE TABLET BY MOUTH THREE TIMES A DAY FOR CHRONIC PAIN    . albuterol (VENTOLIN HFA) 108 (90 Base) MCG/ACT inhaler Inhale 2 puffs into the lungs every 4 (four) hours as needed for wheezing or shortness of breath.    Marland Kitchen atorvastatin (LIPITOR) 20 MG tablet Place 1 tablet (20 mg total) into feeding tube at bedtime. (Patient taking differently: Place 20 mg into feeding tube at bedtime. TAKE ALL WITH  APPLESAUCE) 30 tablet 1  . budesonide-formoterol (SYMBICORT) 160-4.5 MCG/ACT inhaler Inhale 2 puffs into the lungs 2 (two) times daily.    . diclofenac sodium (VOLTAREN) 1 % GEL Apply 2 g topically 4 (four) times daily as needed (pain).     . folic acid (FOLVITE) 1 MG tablet Place 1 tablet (1 mg total) into feeding tube daily. 30 tablet 1  . gabapentin (NEURONTIN) 600 MG  tablet Take 1 tablet (600 mg total) by mouth 3 (three) times daily. 90 tablet 2  . levalbuterol (XOPENEX) 0.63 MG/3ML nebulizer solution Take 3 mLs (0.63 mg total) by nebulization every 4 (four) hours as needed for wheezing or shortness of breath. 3 mL 12  . lidocaine (LIDODERM) 5 % Place 1 patch onto the skin daily as needed (pain).     . methocarbamol (ROBAXIN) 500 MG tablet Take 1 tablet (500 mg total) by mouth 2 (two) times daily as needed for muscle spasms. 60 tablet 1  . metoprolol tartrate (LOPRESSOR) 25 MG tablet Place 12.5 mg into feeding tube daily. CRUSH MED    . sertraline (ZOLOFT) 100 MG tablet Place 2 tablets (200 mg total) into feeding tube daily. 30 tablet 0  . Tiotropium Bromide Monohydrate 2.5 MCG/ACT AERS Inhale 2 puffs into the  lungs daily.    . traZODone (DESYREL) 50 MG tablet Place 1 tablet (50 mg total) into feeding tube at bedtime. 30 tablet 1   No current facility-administered medications for this visit.    REVIEW OF SYSTEMS:  [X]  denotes positive finding, [ ]  denotes negative finding Cardiac  Comments:  Chest pain or chest pressure:    Shortness of breath upon exertion:    Short of breath when lying flat:    Irregular heart rhythm:        Vascular    Pain in calf, thigh, or hip brought on by ambulation:    Pain in feet at night that wakes you up from your sleep:     Blood clot in your veins:    Leg swelling:         Pulmonary    Oxygen at home:    Productive cough:     Wheezing:         Neurologic    Sudden weakness in arms or legs:     Sudden numbness in arms or legs:     Sudden onset of difficulty speaking or slurred speech:    Temporary loss of vision in one eye:     Problems with dizziness:         Gastrointestinal    Blood in stool:     Vomited blood:         Genitourinary    Burning when urinating:     Blood in urine:        Psychiatric    Major depression:         Hematologic    Bleeding problems:    Problems with blood clotting too  easily:        Skin    Rashes or ulcers:        Constitutional    Fever or chills:      PHYSICAL EXAM  Vitals:   06/16/20 1022  BP: (!) 143/67  Pulse: (!) 52  Resp: 20  Temp: 97.9 F (36.6 C)  SpO2: 98%  Weight: 171 lb 14.4 oz (78 kg)  Height: 5\' 10"  (1.778 m)    Constitutional: well appearing. no distress. Appears well nourished.  Neurologic: CN intact. no focal findings. no sensory loss. Psychiatric: Mood and affect symmetric and appropriate. Eyes: No icterus. No conjunctival pallor. Ears, nose, throat: mucous membranes moist. Midline trachea.  Cardiac: regular rate and rhythm.  Respiratory: unlabored. Abdominal: soft, non-tender, non-distended.  Peripheral vascular:  No palpable pedal pulses.  Feet are warm. Extremity: No edema. No cyanosis. No pallor.  Skin: No gangrene. No ulceration.  Lymphatic: No Stemmer's sign. No palpable lymphadenopathy.  PERTINENT LABORATORY AND RADIOLOGIC DATA  Most recent CBC CBC Latest Ref Rng & Units 12/14/2018 11/20/2018 11/16/2018  WBC 4.0 - 10.5 K/uL 11.1(H) 10.4 9.0  Hemoglobin 13.0 - 17.0 g/dL 11.3(L) 11.5(L) 11.7(L)  Hematocrit 39.0 - 52.0 % 37.3(L) 36.2(L) 36.4(L)  Platelets 150 - 400 K/uL 301 350 381     Most recent CMP CMP Latest Ref Rng & Units 11/20/2018 11/16/2018 11/14/2018  Glucose 70 - 99 mg/dL 161(W105(H) 92 99  BUN 8 - 23 mg/dL 96(E31(H) 15 12  Creatinine 0.61 - 1.24 mg/dL 4.540.71 0.980.61 1.190.75  Sodium 135 - 145 mmol/L 137 139 140  Potassium 3.5 - 5.1 mmol/L 4.7 4.1 3.9  Chloride 98 - 111 mmol/L 102 101 105  CO2 22 - 32 mmol/L 28 29 29   Calcium 8.9 - 10.3 mg/dL 1.4(N8.5(L) 8.3(L) 7.8(L)  Total Protein 6.5 - 8.1 g/dL 6.6 - -  Total Bilirubin 0.3 - 1.2 mg/dL 0.6 - -  Alkaline Phos 38 - 126 U/L 107 - -  AST 15 - 41 U/L 25 - -  ALT 0 - 44 U/L 23 - -   Vascular Imaging: VA Carotid Duplex 70% RICA stenosis (PSV 249)  VA ABI R 0.77, TP 102  L 0.71, TP 57  ABI 06/16/20   Yevonne Aline. Stanford Breed, MD [facility] Specialists of  [facility] Phone Number: [phone] 06/16/2020 12:40 PM

## 2020-06-17 ENCOUNTER — Other Ambulatory Visit: Payer: Self-pay

## 2020-06-17 DIAGNOSIS — I739 Peripheral vascular disease, unspecified: Secondary | ICD-10-CM

## 2020-06-17 DIAGNOSIS — I6523 Occlusion and stenosis of bilateral carotid arteries: Secondary | ICD-10-CM

## 2020-06-24 ENCOUNTER — Other Ambulatory Visit: Payer: Self-pay

## 2020-06-24 ENCOUNTER — Encounter: Payer: Self-pay | Admitting: Speech Pathology

## 2020-06-24 ENCOUNTER — Ambulatory Visit: Payer: No Typology Code available for payment source | Attending: Neurology | Admitting: Speech Pathology

## 2020-06-24 DIAGNOSIS — R1314 Dysphagia, pharyngoesophageal phase: Secondary | ICD-10-CM | POA: Insufficient documentation

## 2020-06-24 NOTE — Therapy (Signed)
[facility] [address] [place], [place], [address] Phone: [phone]   Fax:  [phone]  Speech Language Pathology Evaluation  Patient Details  Name: William Kirk MRN: [id number] Date of Birth: [birth year] Referring Provider (SLP): Dr. Randel Books   Encounter Date: 06/24/2020   End of Session - 06/24/20 1515    Visit Number 1    Number of Visits 17    Date for SLP Re-Evaluation 08/19/20    SLP Start Time 1317    SLP Stop Time  1358    SLP Time Calculation (min) 41 min    Activity Tolerance Patient tolerated treatment well           Past Medical History:  Diagnosis Date  . Anxiety   . Arthritis    "hands" (09/21/2016)  . Bleeding stomach ulcer 2000s  . COPD (chronic obstructive pulmonary disease) ([facility])   . Cough   . Depression   . Dyspnea   . Hypertension   . Pneumonia    "now and several times before this" (09/21/2016)  . PVD (peripheral vascular disease) ([facility]) 2014   prev PTCA on RLE    Past Surgical History:  Procedure Laterality Date  . IR ANGIOGRAM SELECTIVE EACH ADDITIONAL VESSEL  07/26/2018  . IR ANGIOGRAM SELECTIVE EACH ADDITIONAL VESSEL  07/26/2018  . IR ANGIOGRAM SELECTIVE EACH ADDITIONAL VESSEL  07/26/2018  . IR EMBO ART  VEN HEMORR LYMPH EXTRAV  INC GUIDE ROADMAPPING  07/26/2018  . IR GASTROSTOMY TUBE MOD SED  12/14/2018  . IR US GUIDE VASC ACCESS RIGHT  07/26/2018  . JOINT REPLACEMENT    . KNEE ARTHROSCOPY Right X 1  . TOTAL HIP ARTHROPLASTY Right 2009  . VIDEO ASSISTED THORACOSCOPY (VATS)/ LOBECTOMY Right 10/18/2018   Procedure: VIDEO ASSISTED THORACOSCOPY (VATS)/ right lower LOBECTOMY;  Surgeon: Loreli Slot, MD;  Location: [facility] OR;  Service: Thoracic;  Laterality: Right;  Marland Kitchen VIDEO ASSISTED THORACOSCOPY (VATS)/DECORTICATION Right 11/09/2018   Procedure: REDO VIDEO ASSISTED THORACOSCOPY (VATS)/DECORTICATION/DRAIN EFFUSION;  Surgeon: Loreli Slot, MD;  Location: [facility] OR;   Service: Thoracic;  Laterality: Right;  Marland Kitchen VIDEO BRONCHOSCOPY N/A 07/27/2018   Procedure: VIDEO BRONCHOSCOPY WITHOUT FLUORO;  Surgeon: Leslye Peer, MD;  Location: [facility] OR;  Service: Cardiopulmonary;  Laterality: N/A;  . VIDEO BRONCHOSCOPY N/A 11/09/2018   Procedure: VIDEO BRONCHOSCOPY;  Surgeon: Loreli Slot, MD;  Location: [facility] OR;  Service: Thoracic;  Laterality: N/A;    There were no vitals filed for this visit.   Subjective Assessment - 06/24/20 1603    Subjective "He hasn't been doing that " re: RMT    Patient is accompained by: Family member   spouse [name]   Currently in Pain? Yes    Pain Score 8     Pain Location Leg    Pain Orientation Left;Right    Pain Descriptors / Indicators Aching    Pain Type Chronic pain    Pain Onset More than a month ago    Pain Relieving Factors gabapentin, compression sock    Multiple Pain Sites No              SLP Evaluation [facility] - 06/24/20 1333      SLP Visit Information   SLP Received On 06/24/20    Referring Provider (SLP) Dr. Randel Books    Onset Date 2018    Medical Diagnosis Pharyngeal esophageal dysphagia      Subjective   Patient/Family Stated Goal To not get aspiration pna  General Information   HPI Pt is a 80 yo male with h/o dysphagia dating back to at least 09/24/2016 when MBS completed with recommendation for dys3/nectar diet.  He has h/o COPD with multiple pnas per his wife.  Pt also with h/o stomach ulcer, depression, PVD, cough.  He has undergone multiple procedures including angiograms 07/26/2018, VATS 10/18/2018 with right lower lobectomy, VATS/decorticaation 11/09/2018 with complications including infections - being on and off vent multiple times.  Pt underwent PEG placement due to level of dysphagia post multiple medical issues - PEG has since been removed. MBSS 05/29/20 advanced from honey thick liquids to regular solids and thin liquids. Thin liquids with head turn left with a chin tuck.    Mobility Status walks  independently      Balance Screen   Has the patient fallen in the past 6 months No    Has the patient had a decrease in activity level because of a fear of falling?  No    Is the patient reluctant to leave their home because of a fear of falling?  No      Prior Functional Status   Cognitive/Linguistic Baseline Baseline deficits    Baseline deficit details memory    Type of Home House     Lives With Spouse    Available Support Friend(s)    Vocation Retired      IT consultant   Overall Cognitive Status History of cognitive impairments - at baseline      Oral Motor/Sensory Function   Overall Oral Motor/Sensory Function Appears within functional limits for tasks assessed      Motor Speech   Overall Motor Speech Appears within functional limits for tasks assessed                           SLP Education - 06/24/20 1514    Education Details HEP for dysphagia, Bring RMT devices next session, swallow precautions, no distractions    Person(s) Educated Patient;Spouse    Methods Explanation;Demonstration;Verbal cues;Handout    Comprehension Verbalized understanding;Returned demonstration;Verbal cues required;Need further instruction            SLP Short Term Goals - 06/24/20 1527      SLP SHORT TERM GOAL #1   Title Pt will follow swallow precautions with occasional min A over 2 sessions    Time 4    Period Weeks    Status New      SLP SHORT TERM GOAL #2   Title Pt will complete HEP for dysphagia with occasional mod A over 2 sessions    Time 4    Period Weeks    Status New      SLP SHORT TERM GOAL #3   Title Pt will complete RMT 2 sets of 25 reps at 70% of MEP/MIP with rare min A    Time 4    Period Weeks    Status New            SLP Long Term Goals - 06/24/20 1532      SLP LONG TERM GOAL #1   Title Pt will follow swallow protocol with rare min A from spouse over 2 weeks    Time 8    Period Weeks    Status New      SLP LONG TERM GOAL #2   Title Pt  will complete HEP for dysphagia with occasional min A over 2 sessions    Time 8  Period Weeks    Status New      SLP LONG TERM GOAL #3   Title Pt will complete RMT at 70% MIP/MEP to reduce s/s of apsiration and wet voice to less than 3x a meal    Time 8    Period Weeks    Status New      SLP LONG TERM GOAL #4   Title Spouse will verbalize 3 s/s of aspiration pna    Time 8    Period Weeks    Status New            Plan - 06/24/20 1515    Clinical Impression Statement Keidyn Fagg is referred to outpt ST due to h/o of dysphagia and aspiration pna. Most recent MBSS advanced Marvin from honey thick liquids to thin liquids with head turn left, chin tuck and intermittent cough/hock. He is accompanied by his spouse, [name]. [name] endorses Arlie has memory issues. She reports she doesn't eat with him so he won't be distracted, but she hears him cough occasionally. PO trials of thin liquid, puree and soft solid reveal fast rate with puree, despite cues to slow down. [name] endorses this as well. Jamy required cues for intermittent cough. Wet, congested voice noted 3x, each time he required cue to clear his throat and swallow. [name] educated when she hears a wet voice to have Wallis clear it and swallow. Izyk employs chin tuck and head left for all swallows, despite it being recommended for thin liquids. [name] states this is how he can remember to carryover this precaution. They brough in exercises from [place] [facility], primarily oral exercises. Gumaro's oral phase today is WNL, so I inititaed HEP for pharyngeal and epiglottic phase with usual min to mod A. Naveen has RMT devices at home, instructed him to bring them to next session. I recommend skilled ST to maximize safety of swallow and reduce risk for apsiration pna. Oral care education provided.    Speech Therapy Frequency 2x / week    Duration 8 weeks   17 visits   Treatment/Interventions Aspiration precaution training;Environmental controls;Oral motor exercises;SLP  instruction and feedback;Compensatory techniques;Cognitive reorganization;Functional tasks;Pharyngeal strengthening exercises;Diet toleration management by SLP;Trials of upgraded texture/liquids;Internal/external aids;Patient/family education;Compensatory strategies;Other (comment)   repeat MBSS or FEES as indicated   Potential to Achieve Goals Good    Potential Considerations Ability to learn/carryover information           Patient will benefit from skilled therapeutic intervention in order to improve the following deficits and impairments:   Dysphagia, pharyngoesophageal phase - Plan: SLP plan of care cert/re-cert    Problem List Patient Active Problem List   Diagnosis Date Noted  . Abnormality of gait 04/16/2020  . Muscle cramp 04/16/2020  . Facet arthropathy 03/17/2020  . 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  . Empyema, right (HCC) 11/09/2018  . Empyema (HCC) 11/06/2018  . Pressure injury of skin 11/06/2018  . 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 Hemoptysis 07/07/2018  . Acute renal failure (ARF) (HCC) 07/07/2018  . Acute respiratory failure (HCC) 07/07/2018  . Chronic obstructive pulmonary disease ([facility]CC) 11/07/2016  . Chronic respiratory failure with hypoxia (HCC) 11/07/2016  . Pulmonary infiltrates 11/07/2016  . Swallowing dysfunction 09/26/2016  . Depression 09/21/2016  . Aspiration pneumonia (HCC) 09/21/2016  . HTN (hypertension) 09/21/2016  . HCAP (healthcare-associated pneumonia)   . COPD with acute exacerbation (HCC) 06/21/2016  . Acute respiratory failure with hypoxia (HCC) 06/21/2016  . Hypokalemia 06/21/2016  .  Hyponatremia 06/21/2016  . Thrombocytosis 06/21/2016  . PVD (peripheral vascular disease) (HCC) 06/21/2016  . Bronchiectasis (HCC) 06/21/2016  . Protein-calorie malnutrition, severe (HCC) 06/21/2016  . Multifocal pneumonia 06/20/2016  . FUO (fever of unknown origin) 04/15/2015  . 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 Other tear of medial meniscus, current injury, unspecified knee, initial encounter 12/03/2012    Rhyanna Sorce, Radene Journey MS, CCC-SLP 06/24/2020, 4:07 PM  [facility] [address] [place], [place], [address] Phone: [phone]   Fax:  [phone]  Name: SEBASTIANO VOLKOV MRN: [id number] Date of Birth: 1940-02-26

## 2020-06-24 NOTE — Patient Instructions (Signed)
  SWALLOWING EXERCISES 1. Effortful Swallows - Squeeze hard with the muscles in your neck while you swallow your  saliva or a sip of water - Repeat 20 times, 2-3 times a day, and use whenever you eat or drink  2. Masako Swallow - swallow with your tongue sticking out - Stick tongue out and gently bite tongue with your teeth - Swallow, while holding your tongue with your teeth - Repeat 20 times, 2-3 times a day  3. Pitch Raise - Repeat "he", once per second in as high of a pitch as you can - Repeat 20 times, 2-3 times a day    4. Mendelsohn Maneuver - "half swallow" exercise - Start to swallow, and keep your Adam's apple up by squeezing hard with the muscles of the throat - Hold the squeeze for 5-7 seconds and then relax - Repeat 20 times, 2-3 times a day  5. Tongue Press - Press your entire tongue as hard as you can against the roof of your mouth for 3-5 seconds - Repeat 20 times, 2-3 times a day   6. Chin pushback - Open your mouth  - Place your fist UNDER your chin near your neck, and push back with your fist for 5 seconds  - Repeat 20 times, 2-3 times a day    Do your Blower 5x for 5 sets 3x a day - bring it in next session  Avoid mixed consistencies such as cereal with milk, soups with veggies, noodles, meat; oatmeal with milk, fruit cocktail

## 2020-07-01 ENCOUNTER — Ambulatory Visit: Payer: No Typology Code available for payment source

## 2020-07-01 ENCOUNTER — Other Ambulatory Visit: Payer: Self-pay

## 2020-07-01 DIAGNOSIS — R1314 Dysphagia, pharyngoesophageal phase: Secondary | ICD-10-CM | POA: Diagnosis not present

## 2020-07-01 NOTE — Therapy (Signed)
[facility] [address] [place], [place], [address] Phone: [phone]   Fax:  [phone]  Speech Language Pathology Treatment  Patient Details  Name: William Kirk MRN: [id number] Date of Birth: 1940/08/09 Referring Provider (SLP): Dr. Patrina Levering   Encounter Date: 07/01/2020   End of Session - 07/01/20 1736    Visit Number 2    Number of Visits 17    Date for SLP Re-Evaluation 08/19/20    SLP Start Time 45    SLP Stop Time  1100    SLP Time Calculation (min) 41 min    Activity Tolerance Patient tolerated treatment well           Past Medical History:  Diagnosis Date  . Anxiety   . Arthritis    "hands" (09/21/2016)  . Bleeding stomach ulcer 2000s  . COPD (chronic obstructive pulmonary disease) ([place])   . Cough   . Depression   . Dyspnea   . Hypertension   . Pneumonia    "now and several times before this" (09/21/2016)  . PVD (peripheral vascular disease) ([place]) 2014   prev PTCA on RLE    Past Surgical History:  Procedure Laterality Date  . IR ANGIOGRAM SELECTIVE EACH ADDITIONAL VESSEL  07/26/2018  . IR ANGIOGRAM SELECTIVE EACH ADDITIONAL VESSEL  07/26/2018  . IR ANGIOGRAM SELECTIVE EACH ADDITIONAL VESSEL  07/26/2018  . IR EMBO ART  VEN HEMORR LYMPH EXTRAV  INC GUIDE ROADMAPPING  07/26/2018  . IR GASTROSTOMY TUBE MOD SED  12/14/2018  . IR US GUIDE VASC ACCESS RIGHT  07/26/2018  . JOINT REPLACEMENT    . KNEE ARTHROSCOPY Right X 1  . TOTAL HIP ARTHROPLASTY Right 2009  . VIDEO ASSISTED THORACOSCOPY (VATS)/ LOBECTOMY Right 10/18/2018   Procedure: VIDEO ASSISTED THORACOSCOPY (VATS)/ right lower LOBECTOMY;  Surgeon: Melrose Nakayama, MD;  Location: [facility];  Service: Thoracic;  Laterality: Right;  Marland Kitchen VIDEO ASSISTED THORACOSCOPY (VATS)/DECORTICATION Right 11/09/2018   Procedure: REDO VIDEO ASSISTED THORACOSCOPY (VATS)/DECORTICATION/DRAIN EFFUSION;  Surgeon: Melrose Nakayama, MD;  Location: [facility];   Service: Thoracic;  Laterality: Right;  Marland Kitchen VIDEO BRONCHOSCOPY N/A 07/27/2018   Procedure: VIDEO BRONCHOSCOPY WITHOUT FLUORO;  Surgeon: Collene Gobble, MD;  Location: [facility];  Service: Cardiopulmonary;  Laterality: N/A;  . VIDEO BRONCHOSCOPY N/A 11/09/2018   Procedure: VIDEO BRONCHOSCOPY;  Surgeon: Melrose Nakayama, MD;  Location: [facility];  Service: Thoracic;  Laterality: N/A;    There were no vitals filed for this visit.   Subjective Assessment - 07/01/20 1019    Subjective Pt is wobbly coming back to ST room. Wife states due to pt's pain.    Patient is accompained by: Family member   wife   Currently in Pain? Yes    Pain Score 8     Pain Location Leg    Pain Orientation Right;Left    Pain Descriptors / Indicators Aching    Pain Type Chronic pain                 ADULT SLP TREATMENT - 07/01/20 1023      General Information   Behavior/Cognition Alert;Cooperative;Pleasant mood      Treatment Provided   Treatment provided Dysphagia      Dysphagia Treatment   Temperature Spikes Noted No    Other treatment/comments "This is the fourth time in ST and we are considering whether to continue with it or not. We have a bunch of exercises." Long discussion about rationale for HEP  William Kirk MRN: [id number] Date of Birth: 1940-03-24  William Kirk MRN: [id number] Date of Birth: 1940-03-24  [facility] [address] [place], [place], [address] Phone: [phone]   Fax:  [phone]  Speech Language Pathology Treatment  Patient Details  Name: William Kirk MRN: [id number] Date of Birth: 1940/08/09 Referring Provider (SLP): Dr. Patrina Levering   Encounter Date: 07/01/2020   End of Session - 07/01/20 1736    Visit Number 2    Number of Visits 17    Date for SLP Re-Evaluation 08/19/20    SLP Start Time 45    SLP Stop Time  1100    SLP Time Calculation (min) 41 min    Activity Tolerance Patient tolerated treatment well           Past Medical History:  Diagnosis Date  . Anxiety   . Arthritis    "hands" (09/21/2016)  . Bleeding stomach ulcer 2000s  . COPD (chronic obstructive pulmonary disease) ([place])   . Cough   . Depression   . Dyspnea   . Hypertension   . Pneumonia    "now and several times before this" (09/21/2016)  . PVD (peripheral vascular disease) ([place]) 2014   prev PTCA on RLE    Past Surgical History:  Procedure Laterality Date  . IR ANGIOGRAM SELECTIVE EACH ADDITIONAL VESSEL  07/26/2018  . IR ANGIOGRAM SELECTIVE EACH ADDITIONAL VESSEL  07/26/2018  . IR ANGIOGRAM SELECTIVE EACH ADDITIONAL VESSEL  07/26/2018  . IR EMBO ART  VEN HEMORR LYMPH EXTRAV  INC GUIDE ROADMAPPING  07/26/2018  . IR GASTROSTOMY TUBE MOD SED  12/14/2018  . IR US GUIDE VASC ACCESS RIGHT  07/26/2018  . JOINT REPLACEMENT    . KNEE ARTHROSCOPY Right X 1  . TOTAL HIP ARTHROPLASTY Right 2009  . VIDEO ASSISTED THORACOSCOPY (VATS)/ LOBECTOMY Right 10/18/2018   Procedure: VIDEO ASSISTED THORACOSCOPY (VATS)/ right lower LOBECTOMY;  Surgeon: Melrose Nakayama, MD;  Location: [facility];  Service: Thoracic;  Laterality: Right;  Marland Kitchen VIDEO ASSISTED THORACOSCOPY (VATS)/DECORTICATION Right 11/09/2018   Procedure: REDO VIDEO ASSISTED THORACOSCOPY (VATS)/DECORTICATION/DRAIN EFFUSION;  Surgeon: Melrose Nakayama, MD;  Location: [facility];   Service: Thoracic;  Laterality: Right;  Marland Kitchen VIDEO BRONCHOSCOPY N/A 07/27/2018   Procedure: VIDEO BRONCHOSCOPY WITHOUT FLUORO;  Surgeon: Collene Gobble, MD;  Location: [facility];  Service: Cardiopulmonary;  Laterality: N/A;  . VIDEO BRONCHOSCOPY N/A 11/09/2018   Procedure: VIDEO BRONCHOSCOPY;  Surgeon: Melrose Nakayama, MD;  Location: [facility] OR;  Service: Thoracic;  Laterality: N/A;    There were no vitals filed for this visit.   Subjective Assessment - 07/01/20 1019    Subjective Pt is wobbly coming back to ST room. Wife states due to pt's pain.    Patient is accompained by: Family member   wife   Currently in Pain? Yes    Pain Score 8     Pain Location Leg    Pain Orientation Right;Left    Pain Descriptors / Indicators Aching    Pain Type Chronic pain                 ADULT SLP TREATMENT - 07/01/20 1023      General Information   Behavior/Cognition Alert;Cooperative;Pleasant mood      Treatment Provided   Treatment provided Dysphagia      Dysphagia Treatment   Temperature Spikes Noted No    Other treatment/comments "This is the fourth time in ST and we are considering whether to continue with it or not. We have a bunch of exercises." Long discussion about rationale for HEP

## 2020-07-01 NOTE — Patient Instructions (Signed)
   I increased the level on the blower to 20cm H2O. Use this setting from this time forward. We will go over the exercises Mickel Baas gave you next time and see if you can to the "Sr. Hjigh" version of the blower.

## 2020-07-03 ENCOUNTER — Ambulatory Visit: Payer: No Typology Code available for payment source

## 2020-07-03 ENCOUNTER — Other Ambulatory Visit: Payer: Self-pay

## 2020-07-03 DIAGNOSIS — R1314 Dysphagia, pharyngoesophageal phase: Secondary | ICD-10-CM

## 2020-07-03 NOTE — Therapy (Signed)
pharyngeal and epiglottic phase req'd cues faded to occasional min A. [place] brought his RMT device he has had since 2020. Blue EMST not trialed today due to clear EMST with 16/25 with appropriate sound  I recommend skilled ST to maximize safety of swallow and reduce risk for apsiration pna. Oral care education provided.    Speech Therapy Frequency 2x / week    Duration 8 weeks   17 visits   Treatment/Interventions Aspiration precaution training;Environmental controls;Oral motor exercises;SLP instruction and feedback;Compensatory techniques;Cognitive reorganization;Functional tasks;Pharyngeal strengthening exercises;Diet toleration management by SLP;Trials of upgraded texture/liquids;Internal/external aids;Patient/family education;Compensatory strategies;Other (comment)   repeat MBSS or FEES as indicated   Potential to Achieve Goals Good    Potential Considerations Ability to learn/carryover information           Patient will benefit from skilled therapeutic intervention in order to improve the following deficits and impairments:   Dysphagia, pharyngoesophageal phase    Problem List Patient Active Problem List   Diagnosis Date Noted  . Abnormality of gait 04/16/2020  . Muscle cramp 04/16/2020  . Facet arthropathy 03/17/2020  . 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  . Empyema, right ([place]) 11/09/2018  . Empyema ([place]) 11/06/2018  . Pressure injury of skin 11/06/2018  . 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 Hemoptysis 07/07/2018  . Acute renal failure (ARF) ([place]) 07/07/2018  . Acute respiratory failure ([place]) 07/07/2018  . Chronic obstructive pulmonary disease ([place]) 11/07/2016  . Chronic respiratory failure with hypoxia ([place]) 11/07/2016  . Pulmonary infiltrates 11/07/2016  . Swallowing dysfunction 09/26/2016  . Depression 09/21/2016  . Aspiration  pneumonia ([place]) 09/21/2016  . HTN (hypertension) 09/21/2016  . HCAP (healthcare-associated pneumonia)   . COPD with acute exacerbation ([place]) 06/21/2016  . Acute respiratory failure with hypoxia ([place]) 06/21/2016  . Hypokalemia 06/21/2016  . Hyponatremia 06/21/2016  . Thrombocytosis 06/21/2016  . PVD (peripheral vascular disease) ([place]) 06/21/2016  . Bronchiectasis ([place]) 06/21/2016  . Protein-calorie malnutrition, severe ([place]) 06/21/2016  . Multifocal pneumonia 06/20/2016  . FUO (fever of unknown origin) 04/15/2015  . 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 Other tear of medial meniscus, current injury, unspecified knee, initial encounter 12/03/2012    [facility] ,[place], [place]  07/03/2020, 12:59 PM  [facility] [address] [place], [place], [address] Phone: [phone]   Fax:  [phone]   Name: WILLIA LAMPERT MRN: [id number] Date of Birth: 01/29/1941  [facility] [address] [place], [place], [address] Phone: [phone]   Fax:  [phone]  Speech Language Pathology Treatment  Patient Details  Name: William Kirk MRN: [id number] Date of Birth: May 29, 1940 Referring Provider (SLP): Dr. Patrina Levering   Encounter Date: 07/03/2020   End of Session - 07/03/20 1256    Visit Number 3    Number of Visits 17    Date for SLP Re-Evaluation 08/19/20    SLP Start Time 1018    SLP Stop Time  1100    SLP Time Calculation (min) 42 min    Activity Tolerance Patient tolerated treatment well           Past Medical History:  Diagnosis Date  . Anxiety   . Arthritis    "hands" (09/21/2016)  . Bleeding stomach ulcer 2000s  . COPD (chronic obstructive pulmonary disease) ([place])   . Cough   . Depression   . Dyspnea   . Hypertension   . Pneumonia    "now and several times before this" (09/21/2016)  . PVD (peripheral vascular disease) ([place]) 2014   prev PTCA on RLE    Past Surgical History:  Procedure Laterality Date  . IR ANGIOGRAM SELECTIVE EACH ADDITIONAL VESSEL  07/26/2018  . IR ANGIOGRAM SELECTIVE EACH ADDITIONAL VESSEL  07/26/2018  . IR ANGIOGRAM SELECTIVE EACH ADDITIONAL VESSEL  07/26/2018  . IR EMBO ART  VEN HEMORR LYMPH EXTRAV  INC GUIDE ROADMAPPING  07/26/2018  . IR GASTROSTOMY TUBE MOD SED  12/14/2018  . IR US GUIDE VASC ACCESS RIGHT  07/26/2018  . JOINT REPLACEMENT    . KNEE ARTHROSCOPY Right X 1  . TOTAL HIP ARTHROPLASTY Right 2009  . VIDEO ASSISTED THORACOSCOPY (VATS)/ LOBECTOMY Right 10/18/2018   Procedure: VIDEO ASSISTED THORACOSCOPY (VATS)/ right lower LOBECTOMY;  Surgeon: Melrose Nakayama, MD;  Location: [facility];  Service: Thoracic;  Laterality: Right;  Marland Kitchen VIDEO ASSISTED THORACOSCOPY (VATS)/DECORTICATION Right 11/09/2018   Procedure: REDO VIDEO ASSISTED THORACOSCOPY (VATS)/DECORTICATION/DRAIN EFFUSION;  Surgeon: Melrose Nakayama, MD;  Location: [facility];   Service: Thoracic;  Laterality: Right;  Marland Kitchen VIDEO BRONCHOSCOPY N/A 07/27/2018   Procedure: VIDEO BRONCHOSCOPY WITHOUT FLUORO;  Surgeon: Collene Gobble, MD;  Location: [facility];  Service: Cardiopulmonary;  Laterality: N/A;  . VIDEO BRONCHOSCOPY N/A 11/09/2018   Procedure: VIDEO BRONCHOSCOPY;  Surgeon: Melrose Nakayama, MD;  Location: [facility];  Service: Thoracic;  Laterality: N/A;    There were no vitals filed for this visit.   Subjective Assessment - 07/03/20 1025    Subjective (wife) "I found out what happened last time (why pt so lethargic/unresponsive) - he forgot his meds."    Patient is accompained by: Family member   wife-[name]   Currently in Pain? Yes    Pain Score 9     Pain Location Leg    Pain Orientation Right;Left    Pain Descriptors / Indicators Aching    Pain Type Chronic pain                 ADULT SLP TREATMENT - 07/03/20 1035      General Information   Behavior/Cognition Alert;Cooperative;Pleasant mood      Treatment Provided   Treatment provided Dysphagia      Dysphagia Treatment   Temperature Spikes Noted No    Other treatment/comments Jaryan reported he is doing some of the exercises on the HEP, and the EMT exercises. 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 [place], [place], [address] Phone: [phone]   Fax:  [phone]  Speech Language Pathology Treatment  Patient Details  Name: William Kirk MRN: [id number] Date of Birth: May 29, 1940 Referring Provider (SLP): Dr. Patrina Levering   Encounter Date: 07/03/2020   End of Session - 07/03/20 1256    Visit Number 3    Number of Visits 17    Date for SLP Re-Evaluation 08/19/20    SLP Start Time 1018    SLP Stop Time  1100    SLP Time Calculation (min) 42 min    Activity Tolerance Patient tolerated treatment well           Past Medical History:  Diagnosis Date  . Anxiety   . Arthritis    "hands" (09/21/2016)  . Bleeding stomach ulcer 2000s  . COPD (chronic obstructive pulmonary disease) ([place])   . Cough   . Depression   . Dyspnea   . Hypertension   . Pneumonia    "now and several times before this" (09/21/2016)  . PVD (peripheral vascular disease) ([place]) 2014   prev PTCA on RLE    Past Surgical History:  Procedure Laterality Date  . IR ANGIOGRAM SELECTIVE EACH ADDITIONAL VESSEL  07/26/2018  . IR ANGIOGRAM SELECTIVE EACH ADDITIONAL VESSEL  07/26/2018  . IR ANGIOGRAM SELECTIVE EACH ADDITIONAL VESSEL  07/26/2018  . IR EMBO ART  VEN HEMORR LYMPH EXTRAV  INC GUIDE ROADMAPPING  07/26/2018  . IR GASTROSTOMY TUBE MOD SED  12/14/2018  . IR US GUIDE VASC ACCESS RIGHT  07/26/2018  . JOINT REPLACEMENT    . KNEE ARTHROSCOPY Right X 1  . TOTAL HIP ARTHROPLASTY Right 2009  . VIDEO ASSISTED THORACOSCOPY (VATS)/ LOBECTOMY Right 10/18/2018   Procedure: VIDEO ASSISTED THORACOSCOPY (VATS)/ right lower LOBECTOMY;  Surgeon: Melrose Nakayama, MD;  Location: [facility];  Service: Thoracic;  Laterality: Right;  Marland Kitchen VIDEO ASSISTED THORACOSCOPY (VATS)/DECORTICATION Right 11/09/2018   Procedure: REDO VIDEO ASSISTED THORACOSCOPY (VATS)/DECORTICATION/DRAIN EFFUSION;  Surgeon: Melrose Nakayama, MD;  Location: [facility];   Service: Thoracic;  Laterality: Right;  Marland Kitchen VIDEO BRONCHOSCOPY N/A 07/27/2018   Procedure: VIDEO BRONCHOSCOPY WITHOUT FLUORO;  Surgeon: Collene Gobble, MD;  Location: [facility];  Service: Cardiopulmonary;  Laterality: N/A;  . VIDEO BRONCHOSCOPY N/A 11/09/2018   Procedure: VIDEO BRONCHOSCOPY;  Surgeon: Melrose Nakayama, MD;  Location: [facility];  Service: Thoracic;  Laterality: N/A;    There were no vitals filed for this visit.   Subjective Assessment - 07/03/20 1025    Subjective (wife) "I found out what happened last time (why pt so lethargic/unresponsive) - he forgot his meds."    Patient is accompained by: Family member   wife-[name]   Currently in Pain? Yes    Pain Score 9     Pain Location Leg    Pain Orientation Right;Left    Pain Descriptors / Indicators Aching    Pain Type Chronic pain                 ADULT SLP TREATMENT - 07/03/20 1035      General Information   Behavior/Cognition Alert;Cooperative;Pleasant mood      Treatment Provided   Treatment provided Dysphagia      Dysphagia Treatment   Temperature Spikes Noted No    Other treatment/comments Jaryan reported he is doing some of the exercises on the HEP, and the EMT exercises. He req'd mod A usually for full breath with EMT at

## 2020-07-15 ENCOUNTER — Ambulatory Visit: Payer: No Typology Code available for payment source | Attending: Neurology

## 2020-07-15 ENCOUNTER — Other Ambulatory Visit: Payer: Self-pay

## 2020-07-15 DIAGNOSIS — R1314 Dysphagia, pharyngoesophageal phase: Secondary | ICD-10-CM | POA: Diagnosis present

## 2020-07-15 NOTE — Therapy (Signed)
On-going            Plan - 07/15/20 1606    Clinical Impression Statement William Kirk is referred to outpt ST due to h/o of dysphagia and aspiration pna. Most recent MBSS advanced William Kirk from honey thick liquids to thin liquids with head turn left, chin tuck and intermittent cough/hock. He is accompanied by his spouse, William Kirk. HEP for pharyngeal and epiglottic phase req'd cues faded to occasional min A. William Kirk brought his RMT device he has had since 2020. Pt will keep his clear EMST as pt (reportedly, via wife) did not perform EMST as he does at home - today he performed incorrectly blowing as hard as he could as long as he could despite SLP model and mod cues.  I recommend cont'd skilled ST to maximize safety of swallow and reduce risk for apsiration pna.    Speech Therapy Frequency 2x / week    Duration 8 weeks   17 visits   Treatment/Interventions Aspiration precaution training;Environmental controls;Oral motor exercises;SLP instruction and feedback;Compensatory techniques;Cognitive reorganization;Functional tasks;Pharyngeal strengthening exercises;Diet toleration management by SLP;Trials of upgraded texture/liquids;Internal/external aids;Patient/family education;Compensatory strategies;Other (comment)   repeat MBSS or FEES as indicated   Potential to Achieve Goals Good    Potential Considerations Ability to learn/carryover information           Patient will benefit from skilled therapeutic intervention in order to improve the following deficits and impairments:   Dysphagia, pharyngoesophageal phase    Problem List Patient Active Problem List   Diagnosis Date Noted  . Abnormality of gait 04/16/2020  . Muscle cramp 04/16/2020  . Facet arthropathy 03/17/2020  . 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  . Empyema, right ([place])  11/09/2018  . Empyema ([place]) 11/06/2018  . Pressure injury of skin 11/06/2018  . 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 Hemoptysis 07/07/2018  . Acute renal failure (ARF) ([place]) 07/07/2018  . Acute respiratory failure ([place]) 07/07/2018  . Chronic obstructive pulmonary disease ([place]) 11/07/2016  . Chronic respiratory failure with hypoxia ([place]) 11/07/2016  . Pulmonary infiltrates 11/07/2016  . Swallowing dysfunction 09/26/2016  . Depression 09/21/2016  . Aspiration pneumonia ([place]) 09/21/2016  . HTN (hypertension) 09/21/2016  . HCAP (healthcare-associated pneumonia)   . COPD with acute exacerbation ([place]) 06/21/2016  . Acute respiratory failure with hypoxia ([place]) 06/21/2016  . Hypokalemia 06/21/2016  . Hyponatremia 06/21/2016  . Thrombocytosis 06/21/2016  . PVD (peripheral vascular disease) ([place]) 06/21/2016  . Bronchiectasis ([place]) 06/21/2016  . Protein-calorie malnutrition, severe ([place]) 06/21/2016  . Multifocal pneumonia 06/20/2016  . FUO (fever of unknown origin) 04/15/2015  . 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 Other tear of medial meniscus, current injury, unspecified knee, initial encounter 12/03/2012    [facility] ,[place], [place]  07/15/2020, 4:10 PM  [place] [address] [place], [place], [address] Phone: [phone]   Fax:  [phone]   Name: William Kirk MRN: [id number] Date of Birth: July 15, 1940  [place] [address] [place], [place], [address] Phone: [phone]   Fax:  [phone]  Speech Language Pathology Treatment  Patient Details  Name: William Kirk MRN: [id number] Date of Birth: August 31, 1940 Referring Provider (SLP): Dr. Patrina Levering   Encounter Date: 07/15/2020   End of Session - 07/15/20 1606    Visit Number 4    Number of Visits 17    Date for SLP Re-Evaluation 08/19/20    SLP Start Time 85    SLP Stop Time  1100    SLP Time Calculation (min) 40 min    Activity Tolerance Patient tolerated treatment well           Past Medical History:  Diagnosis Date  . Anxiety   . Arthritis    "hands" (09/21/2016)  . Bleeding stomach ulcer 2000s  . COPD (chronic obstructive pulmonary disease) ([place])   . Cough   . Depression   . Dyspnea   . Hypertension   . Pneumonia    "now and several times before this" (09/21/2016)  . PVD (peripheral vascular disease) ([place]) 2014   prev PTCA on RLE    Past Surgical History:  Procedure Laterality Date  . IR ANGIOGRAM SELECTIVE EACH ADDITIONAL VESSEL  07/26/2018  . IR ANGIOGRAM SELECTIVE EACH ADDITIONAL VESSEL  07/26/2018  . IR ANGIOGRAM SELECTIVE EACH ADDITIONAL VESSEL  07/26/2018  . IR EMBO ART  VEN HEMORR LYMPH EXTRAV  INC GUIDE ROADMAPPING  07/26/2018  . IR GASTROSTOMY TUBE MOD SED  12/14/2018  . IR US GUIDE VASC ACCESS RIGHT  07/26/2018  . JOINT REPLACEMENT    . KNEE ARTHROSCOPY Right X 1  . TOTAL HIP ARTHROPLASTY Right 2009  . VIDEO ASSISTED THORACOSCOPY (VATS)/ LOBECTOMY Right 10/18/2018   Procedure: VIDEO ASSISTED THORACOSCOPY (VATS)/ right lower LOBECTOMY;  Surgeon: Melrose Nakayama, MD;  Location: [facility];  Service: Thoracic;  Laterality: Right;  Marland Kitchen VIDEO ASSISTED THORACOSCOPY (VATS)/DECORTICATION Right 11/09/2018   Procedure: REDO VIDEO ASSISTED THORACOSCOPY (VATS)/DECORTICATION/DRAIN EFFUSION;  Surgeon: Melrose Nakayama, MD;  Location: [facility];   Service: Thoracic;  Laterality: Right;  Marland Kitchen VIDEO BRONCHOSCOPY N/A 07/27/2018   Procedure: VIDEO BRONCHOSCOPY WITHOUT FLUORO;  Surgeon: Collene Gobble, MD;  Location: [facility];  Service: Cardiopulmonary;  Laterality: N/A;  . VIDEO BRONCHOSCOPY N/A 11/09/2018   Procedure: VIDEO BRONCHOSCOPY;  Surgeon: Melrose Nakayama, MD;  Location: [facility] OR;  Service: Thoracic;  Laterality: N/A;    There were no vitals filed for this visit.   Subjective Assessment - 07/15/20 1021    Subjective (wife) "I haven't helpted him - he's been doing them so I'm trusting he's doing them right."                 ADULT SLP TREATMENT - 07/15/20 1026      General Information   Behavior/Cognition Alert;Cooperative;Pleasant mood      Treatment Provided   Treatment provided Dysphagia      Dysphagia Treatment   Temperature Spikes Noted No    Patient observed directly with PO's Yes    Oral Phase Signs & Symptoms --   none noted   Pharyngeal Phase Signs & Symptoms --   none noted   Type of cueing Verbal;Visual    Amount of cueing Moderate   SLP provided initial cues for chin tuck/tilt lt, and intermittent cough/hock; wife stated if she does not hear cough or hock or throat clear voluntarily every 4-5 swallows she auditorily cues pt and he copies  On-going            Plan - 07/15/20 1606    Clinical Impression Statement William Kirk is referred to outpt ST due to h/o of dysphagia and aspiration pna. Most recent MBSS advanced William Kirk from honey thick liquids to thin liquids with head turn left, chin tuck and intermittent cough/hock. He is accompanied by his spouse, William Kirk. HEP for pharyngeal and epiglottic phase req'd cues faded to occasional min A. William Kirk brought his RMT device he has had since 2020. Pt will keep his clear EMST as pt (reportedly, via wife) did not perform EMST as he does at home - today he performed incorrectly blowing as hard as he could as long as he could despite SLP model and mod cues.  I recommend cont'd skilled ST to maximize safety of swallow and reduce risk for apsiration pna.    Speech Therapy Frequency 2x / week    Duration 8 weeks   17 visits   Treatment/Interventions Aspiration precaution training;Environmental controls;Oral motor exercises;SLP instruction and feedback;Compensatory techniques;Cognitive reorganization;Functional tasks;Pharyngeal strengthening exercises;Diet toleration management by SLP;Trials of upgraded texture/liquids;Internal/external aids;Patient/family education;Compensatory strategies;Other (comment)   repeat MBSS or FEES as indicated   Potential to Achieve Goals Good    Potential Considerations Ability to learn/carryover information           Patient will benefit from skilled therapeutic intervention in order to improve the following deficits and impairments:   Dysphagia, pharyngoesophageal phase    Problem List Patient Active Problem List   Diagnosis Date Noted  . Abnormality of gait 04/16/2020  . Muscle cramp 04/16/2020  . Facet arthropathy 03/17/2020  . 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  . Empyema, right ([place])  11/09/2018  . Empyema ([place]) 11/06/2018  . Pressure injury of skin 11/06/2018  . 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 Hemoptysis 07/07/2018  . Acute renal failure (ARF) ([place]) 07/07/2018  . Acute respiratory failure ([place]) 07/07/2018  . Chronic obstructive pulmonary disease ([place]) 11/07/2016  . Chronic respiratory failure with hypoxia ([place]) 11/07/2016  . Pulmonary infiltrates 11/07/2016  . Swallowing dysfunction 09/26/2016  . Depression 09/21/2016  . Aspiration pneumonia ([place]) 09/21/2016  . HTN (hypertension) 09/21/2016  . HCAP (healthcare-associated pneumonia)   . COPD with acute exacerbation ([place]) 06/21/2016  . Acute respiratory failure with hypoxia ([place]) 06/21/2016  . Hypokalemia 06/21/2016  . Hyponatremia 06/21/2016  . Thrombocytosis 06/21/2016  . PVD (peripheral vascular disease) ([place]) 06/21/2016  . Bronchiectasis ([place]) 06/21/2016  . Protein-calorie malnutrition, severe ([place]) 06/21/2016  . Multifocal pneumonia 06/20/2016  . FUO (fever of unknown origin) 04/15/2015  . 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 Other tear of medial meniscus, current injury, unspecified knee, initial encounter 12/03/2012    [facility] ,[place], [place]  07/15/2020, 4:10 PM  [place] [address] [place], [place], [address] Phone: [phone]   Fax:  [phone]   Name: William Kirk MRN: [id number] Date of Birth: July 15, 1940

## 2020-07-17 ENCOUNTER — Ambulatory Visit: Payer: No Typology Code available for payment source

## 2020-07-20 ENCOUNTER — Ambulatory Visit: Payer: No Typology Code available for payment source | Admitting: Speech Pathology

## 2020-07-20 ENCOUNTER — Other Ambulatory Visit: Payer: Self-pay

## 2020-07-20 ENCOUNTER — Encounter: Payer: Self-pay | Admitting: Speech Pathology

## 2020-07-20 DIAGNOSIS — R1314 Dysphagia, pharyngoesophageal phase: Secondary | ICD-10-CM | POA: Diagnosis not present

## 2020-07-20 NOTE — Therapy (Signed)
[facility] [address] [place], [place], [address] Phone: [phone]   Fax:  [phone]  Speech Language Pathology Treatment  Patient Details  Name: William Kirk MRN: [id number] Date of Birth: 29-Mar-1940 Referring Provider (SLP): Dr. Randel Books   Encounter Date: 07/20/2020   End of Session - 07/20/20 1608     Visit Number 5    Number of Visits 17    Date for SLP Re-Evaluation 08/19/20    SLP Start Time 1232    SLP Stop Time  1312    SLP Time Calculation (min) 40 min             Past Medical History:  Diagnosis Date   Anxiety    Arthritis    "hands" (09/21/2016)   Bleeding stomach ulcer 2000s   COPD (chronic obstructive pulmonary disease) ([facility])    Cough    Depression    Dyspnea    Hypertension    Pneumonia    "now and several times before this" (09/21/2016)   PVD (peripheral vascular disease) ([facility]) 2014   prev PTCA on RLE    Past Surgical History:  Procedure Laterality Date   IR ANGIOGRAM SELECTIVE EACH ADDITIONAL VESSEL  07/26/2018   IR ANGIOGRAM SELECTIVE EACH ADDITIONAL VESSEL  07/26/2018   IR ANGIOGRAM SELECTIVE EACH ADDITIONAL VESSEL  07/26/2018   IR EMBO ART  VEN HEMORR LYMPH EXTRAV  INC GUIDE ROADMAPPING  07/26/2018   IR GASTROSTOMY TUBE MOD SED  12/14/2018   IR US GUIDE VASC ACCESS RIGHT  07/26/2018   JOINT REPLACEMENT     KNEE ARTHROSCOPY Right X 1   TOTAL HIP ARTHROPLASTY Right 2009   VIDEO ASSISTED THORACOSCOPY (VATS)/ LOBECTOMY Right 10/18/2018   Procedure: VIDEO ASSISTED THORACOSCOPY (VATS)/ right lower LOBECTOMY;  Surgeon: Loreli Slot, MD;  Location: [facility] OR;  Service: Thoracic;  Laterality: Right;   VIDEO ASSISTED THORACOSCOPY (VATS)/DECORTICATION Right 11/09/2018   Procedure: REDO VIDEO ASSISTED THORACOSCOPY (VATS)/DECORTICATION/DRAIN EFFUSION;  Surgeon: Loreli Slot, MD;  Location: [facility] OR;  Service: Thoracic;  Laterality: Right;   VIDEO BRONCHOSCOPY N/A 07/27/2018    Procedure: VIDEO BRONCHOSCOPY WITHOUT FLUORO;  Surgeon: Leslye Peer, MD;  Location: [facility] OR;  Service: Cardiopulmonary;  Laterality: N/A;   VIDEO BRONCHOSCOPY N/A 11/09/2018   Procedure: VIDEO BRONCHOSCOPY;  Surgeon: Loreli Slot, MD;  Location: [facility] OR;  Service: Thoracic;  Laterality: N/A;    There were no vitals filed for this visit.   Subjective Assessment - 07/20/20 1233     Subjective "I haven't choked yet"    Patient is accompained by: Family member   wife - [name]   Currently in Pain? Yes    Pain Score 9     Pain Location Leg    Pain Orientation Right;Left    Pain Descriptors / Indicators Aching    Pain Type Chronic pain    Pain Onset More than a month ago    Pain Frequency Constant    Pain Relieving Factors gabapentin, compression sock    Multiple Pain Sites No                   ADULT SLP TREATMENT - 07/20/20 1236       General Information   Behavior/Cognition Alert;Cooperative;Pleasant mood      Treatment Provided   Treatment provided Cognitive-Linquistic      Dysphagia Treatment   Temperature Spikes Noted No    Respiratory Status Room air    Oral Cavity -  Dentition Adequate natural dentition    Treatment Methods Skilled observation;Compensation strategy training;Therapeutic exercise;Patient/caregiver education    Patient observed directly with PO's Yes    Type of PO's observed Thin liquids    Feeding Able to feed self    Liquids provided via Cup    Pharyngeal Phase Signs & Symptoms Multiple swallows;Immediate cough;Delayed cough;Wet vocal quality    Type of cueing Verbal;Visual    Amount of cueing Moderate    Other treatment/comments Frequent mod A with HEP, including chin push back, hard swallow, tongue press. Consistent max A with visual, tactile, mirror, modeling and verbal cues required for [place]. Overall, Haleem had more difficulty initiating swallows on HEP today. Spouse endorsed this. When Paulmichael took liquid sips during HEP, he required  usual min A to tuck chin to the left. I provided a table top tent sign "CHIN TUCK LEFT" to place in front of his plate. Gave [name] a sheet that said this to put in his bear they use on his left as visual cue. He forgot chin tuck left 1x, resulting in immediate coughing and wet voice. Used this as rationale for postural modifications. EMST not addressed today. [name] reports he is completing this at home consistently      Assessment / Recommendations / Plan   Plan Continue with current plan of care      Dysphagia Recommendations   Diet recommendations Dysphagia 3 (mechanical soft);Thin liquid    Liquids provided via Cup    Medication Administration Whole meds with puree    Supervision Patient able to self feed    Compensations Clear throat after each swallow   hock intermittently   Postural Changes and/or Swallow Maneuvers Chin tuck;Head tilt left during swallow      Progression Toward Goals   Progression toward goals Not progressing toward goals (comment)   memory, ability to carryovoer             SLP Education - 07/20/20 1603     Education Details HEP, spouse to cue and observe pt with HEP and meals, use visual for swallow precautions    Person(s) Educated Patient;Spouse    Methods Explanation;Demonstration;Verbal cues;Handout    Comprehension Verbalized understanding;Returned demonstration;Verbal cues required;Need further instruction              SLP Short Term Goals - 07/20/20 1607       SLP SHORT TERM GOAL #1   Title Pt will follow swallow precautions with usual min A over 2 sessions    Time 2    Period Weeks    Status Revised      SLP SHORT TERM GOAL #2   Title Pt will complete HEP for dysphagia with usual mod A over 2 sessions    Time 2    Period Weeks    Status On-going      SLP SHORT TERM GOAL #3   Title Pt will complete RMT 2 sets of 25 reps at 70% of MEP/MIP with rare min A    Time 2    Period Weeks    Status On-going              SLP Long Term  Goals - 07/20/20 1608       SLP LONG TERM GOAL #1   Title Pt will follow swallow protocol with occasoinal min A from spouse over 2 weeks    Time 6    Period Weeks    Status Revised      SLP LONG  TERM GOAL #2   Title Pt will complete HEP for dysphagia with occasional min-mod A over 2 sessions    Time 6    Period Weeks    Status Revised      SLP LONG TERM GOAL #3   Title Pt will complete RMT at 70% MIP/MEP to reduce s/s of apsiration and wet voice to less than 3x a meal    Time 6    Period Weeks    Status On-going      SLP LONG TERM GOAL #4   Title Spouse will verbalize 3 s/s of aspiration pna    Time 6    Period Weeks    Status On-going              Plan - 07/20/20 1604     Clinical Impression Statement Dymon has progressed to requring intermittent cueing, visual and verbal, to carryover swallow precations of chin tuck to the left. Spouse is being trained to provide appropriate cueing and supervision to support Antwion following precautions and completing HEP accurately. Due to memory issues, [place]burn will need ongoing cues from family to carrover diet recommendations and precautions. HEP continues to be variable, requiring usual to frequent mod to max A. Continue skilled ST to maximize safety of swallow and reduce risk of hospitalization from aspiration pna.    Speech Therapy Frequency 2x / week    Duration 8 weeks   17 visits   Treatment/Interventions Aspiration precaution training;Environmental controls;Oral motor exercises;SLP instruction and feedback;Compensatory techniques;Cognitive reorganization;Functional tasks;Pharyngeal strengthening exercises;Diet toleration management by SLP;Trials of upgraded texture/liquids;Internal/external aids;Patient/family education;Compensatory strategies;Other (comment)    Potential to Achieve Goals Good    Potential Considerations Ability to learn/carryover information             Patient will benefit from skilled therapeutic intervention  in order to improve the following deficits and impairments:   Dysphagia, pharyngoesophageal phase    Problem List Patient Active Problem List   Diagnosis Date Noted   Abnormality of gait 04/16/2020   Muscle cramp 04/16/2020   Facet arthropathy 03/17/2020   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   Empyema, right ([facility]) 11/09/2018   Empyema ([facility]) 11/06/2018   Pressure injury of skin 11/06/2018   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   Hemoptysis 07/07/2018   Acute renal failure (ARF) ([facility]) 07/07/2018   Acute respiratory failure (HCC) 07/07/2018   Chronic obstructive pulmonary disease ([facility]) 11/07/2016   Chronic respiratory failure with hypoxia ([facility]) 11/07/2016   Pulmonary infiltrates 11/07/2016   Swallowing dysfunction 09/26/2016   Depression 09/21/2016   Aspiration pneumonia ([facility]) 09/21/2016   HTN (hypertension) 09/21/2016   HCAP (healthcare-associated pneumonia)    COPD with acute exacerbation ([facility]CC) 06/21/2016   Acute respiratory failure with hypoxia ([facility]) 06/21/2016   Hypokalemia 06/21/2016   Hyponatremia 06/21/2016   Thrombocytosis 06/21/2016   PVD (peripheral vascular disease) (HCC) 06/21/2016   Bronchiectasis (HCC) 06/21/2016   Protein-calorie malnutrition, severe ([facility]) 06/21/2016   Multifocal pneumonia 06/20/2016   FUO (fever of unknown origin) 04/15/2015   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 [place], [place], [address] Phone: [phone]   Fax:  [phone]   Name: TALIB JOHANNESSEN MRN: [id number] Date of Birth: July 30, 1940

## 2020-07-24 ENCOUNTER — Other Ambulatory Visit: Payer: Self-pay

## 2020-07-24 ENCOUNTER — Encounter
Payer: No Typology Code available for payment source | Attending: Physical Medicine & Rehabilitation | Admitting: Physical Medicine and Rehabilitation

## 2020-07-24 VITALS — BP 140/63 | HR 61 | Temp 98.3°F | Ht 70.0 in | Wt 172.2 lb

## 2020-07-24 DIAGNOSIS — R269 Unspecified abnormalities of gait and mobility: Secondary | ICD-10-CM | POA: Diagnosis present

## 2020-07-24 DIAGNOSIS — M5442 Lumbago with sciatica, left side: Secondary | ICD-10-CM | POA: Diagnosis present

## 2020-07-24 DIAGNOSIS — M5441 Lumbago with sciatica, right side: Secondary | ICD-10-CM | POA: Diagnosis present

## 2020-07-24 DIAGNOSIS — G8929 Other chronic pain: Secondary | ICD-10-CM | POA: Diagnosis present

## 2020-07-24 MED ORDER — GABAPENTIN 800 MG PO TABS: 800.0000 mg | ORAL_TABLET | Freq: Three times a day (TID) | ORAL | 3 refills | Status: DC

## 2020-07-24 NOTE — Patient Instructions (Signed)
1) Ginger (especially studied for arthritis)- reduce leukotriene production to decrease inflammation 2) Blueberries- high in phytonutrients that decrease inflammation 3) Salmon- marine omega-3s reduce joint swelling and pain 4) Pumpkin seeds- reduce inflammation 5) dark chocolate- reduces inflammation 6) turmeric- reduces inflammation 7) tart cherries - reduce pain and stiffness 8) extra virgin olive oil - its compound olecanthal helps to block prostaglandins  9) chili peppers- can be eaten or applied topically via capsaicin 10) mint- helpful for headache, muscle aches, joint pain, and itching 11) garlic- reduces inflammation  Link to further information on diet for chronic pain: [email or web address]  

## 2020-07-24 NOTE — Progress Notes (Signed)
Subjective:    Patient ID: William Kirk, male    DOB: May 07, 1940, 80 y.o.   MRN: [id number]  HPI Male with pmh/psh of PVD, HTN, depression, COPD, gastric ulcer, OA, anxiety, VATS, right THA, right meniscal tear repair presents for f/u of right leg > back pain. Referred to me by Dr. Allena Katz.  Initially stated: Wife provides history. CTS notes reviewed for VATS.  Pain started 12/2019. Stable. Denies inciting event.  He received a cream (helped), OTC lidocaine patch, heat helps.  Ambulation exacerbates the pain, for which he sits for a little while and is able to ambulate again.  Pin/needles in legs.  Radiates to calfs/feet. Intermittent.  Denies falls. Denies associated weakness/numbness.  Pain limits ambulation. He follows with vascular. He notes easier to go down stairs.   Wife supplements history. Since last visit, he notes improvement with Gabapentin increase. Denies falls. Back pain has improved.   He has been mowing his his lawn, works in the yard, he is not doing any formal physical therapy. He does pushups every day. He also walks. Discussed Qutenza and he and his wife are interested. Reviewed vascular surgery note with them.   Pain Inventory Average Pain 8 Pain Right Now 9 My pain is constant, burning, tingling, and aching  In the last 24 hours, has pain interfered with the following? General activity 6 Relation with others 0 Enjoyment of life 5 What TIME of day is your pain at its worst? daytime Sleep (in general) Fair  Pain is worse with: walking, bending, standing, and some activites Pain improves with: rest, heat/ice, and medication Relief from Meds: 5    Family History  Problem Relation Age of Onset   Cancer Father 56       Brain   Dementia Mother    Social History   Socioeconomic History   Marital status: Married    Spouse name: Not on file   Number of children: Not on file   Years of education: 16   Highest education level: Not on file  Occupational  History   Occupation: Patent examiner  Tobacco Use   Smoking status: Former    Packs/day: 1.00    Years: 55.00    Pack years: 55.00    Types: Cigarettes    Quit date: 04/2015    Years since quitting: 5.2   Smokeless tobacco: Never  Vaping Use   Vaping Use: Never used  Substance and Sexual Activity   Alcohol use: No    Alcohol/week: 0.0 standard drinks   Drug use: No   Sexual activity: Not Currently    Partners: Female  Other Topics Concern   Not on file  Social History Narrative   Not on file   Social Determinants of Health   Financial Resource Strain: Not on file  Food Insecurity: Not on file  Transportation Needs: Not on file  Physical Activity: Not on file  Stress: Not on file  Social Connections: Not on file   Past Surgical History:  Procedure Laterality Date   IR ANGIOGRAM SELECTIVE EACH ADDITIONAL VESSEL  07/26/2018   IR ANGIOGRAM SELECTIVE EACH ADDITIONAL VESSEL  07/26/2018   IR ANGIOGRAM SELECTIVE EACH ADDITIONAL VESSEL  07/26/2018   IR EMBO ART  VEN HEMORR LYMPH EXTRAV  INC GUIDE ROADMAPPING  07/26/2018   IR GASTROSTOMY TUBE MOD SED  12/14/2018   IR US GUIDE VASC ACCESS RIGHT  07/26/2018   JOINT REPLACEMENT     KNEE ARTHROSCOPY Right X 1   TOTAL  HIP ARTHROPLASTY Right 2009   VIDEO ASSISTED THORACOSCOPY (VATS)/ LOBECTOMY Right 10/18/2018   Procedure: VIDEO ASSISTED THORACOSCOPY (VATS)/ right lower LOBECTOMY;  Surgeon: Loreli Slot, MD;  Location: [facility] OR;  Service: Thoracic;  Laterality: Right;   VIDEO ASSISTED THORACOSCOPY (VATS)/DECORTICATION Right 11/09/2018   Procedure: REDO VIDEO ASSISTED THORACOSCOPY (VATS)/DECORTICATION/DRAIN EFFUSION;  Surgeon: Loreli Slot, MD;  Location: [facility] OR;  Service: Thoracic;  Laterality: Right;   VIDEO BRONCHOSCOPY N/A 07/27/2018   Procedure: VIDEO BRONCHOSCOPY WITHOUT FLUORO;  Surgeon: Leslye Peer, MD;  Location: [facility] OR;  Service: Cardiopulmonary;  Laterality: N/A;   VIDEO BRONCHOSCOPY N/A 11/09/2018   Procedure:  VIDEO BRONCHOSCOPY;  Surgeon: Loreli Slot, MD;  Location: [facility] OR;  Service: Thoracic;  Laterality: N/A;   Past Medical History:  Diagnosis Date   Anxiety    Arthritis    "hands" (09/21/2016)   Bleeding stomach ulcer 2000s   COPD (chronic obstructive pulmonary disease) (HCC)    Cough    Depression    Dyspnea    Hypertension    Pneumonia    "now and several times before this" (09/21/2016)   PVD (peripheral vascular disease) (HCC) 2014   prev PTCA on RLE   BP 140/63 (BP Location: Left Arm, Patient Position: Sitting, Cuff Size: Normal)   Pulse 61   Temp 98.3 F (36.8 C) (Oral)   Ht 5\' 10"  (1.778 m)   Wt 172 lb 3.2 oz (78.1 kg)   SpO2 93%   BMI 24.71 kg/m   Opioid Risk Score:   Fall Risk Score:  `1  Depression screen PHQ 2/9  Depression screen [facility] 2/9 03/17/2020 04/15/2015  Decreased Interest 1 1  Down, Depressed, Hopeless 0 0  PHQ - 2 Score 1 1  Altered sleeping 0 -  Tired, decreased energy 2 -  Change in appetite 0 -  Feeling bad or failure about yourself  0 -  Trouble concentrating 0 -  Moving slowly or fidgety/restless 0 -  Suicidal thoughts 0 -  PHQ-9 Score 3 -   Review of Systems  Constitutional: Negative.   HENT: Negative.    Eyes: Negative.   Respiratory: Negative.    Cardiovascular: Negative.   Gastrointestinal: Negative.   Endocrine: Negative.   Genitourinary: Negative.   Musculoskeletal:  Positive for back pain.       Calf pain  Skin: Negative.   Allergic/Immunologic: Negative.   All other systems reviewed and are negative.    Objective:   Physical Exam  Gen: no distress, normal appearing HEENT: oral mucosa pink and moist, NCAT Cardio: Reg rate Chest: normal effort, normal rate of breathing Abd: soft, non-distended Ext: no edema Psych: pleasant, normal affect Musc: No edema in extremities.  No tenderness in extremities. No tenderness in back  Gait: Kyphotic and slightly wide based, unchanged Neuro: Alert Motor: 5/5 b/l LE Sensation  intact to light touch B/l LE Vibration: Absent in foot, L>R previously Proprioception: Limited in LLE previously    Assessment & Plan:  Male with pmh of PVD, HTN, depression, COPD, gastric ulcer, OA, anxiety presents for f/u of back and leg pain.   1. Chronic right leg (predominantly in calf) > back pain - mutlifactorial: PVD +/- facet arthropathy +/- radiculopathy  MRI from 12/2019 revealing scoliosis and multilevel lumbar spondylosis. L2-3 foraminal stenosis, L3-4 facet arthropathy, foraminal narrowing,L4-5 foraminal narrowing. L5-S1 foraminal narrowing, all bilaterally.   Labs reviewed from 2020, encouraged to bring in recent labs  Will avoid NSAIDs due to comorbidities  Will  consider PT with trial TENS after Vascular appointment  Will consider Bracing  Continue Voltaren gel  Continue Gabapentin to 600 TID  Will consider Cymbalta  Continue Robaxin 500 BID PRN, avoid taking together with Gabapentin  Patient states main goal is to ambulate  Will consider ESIs  Continue heat.   Vascular notes reviewed.   Ordered CMP, Vit B, Vit D, Mag, awaiting, discussed with family.   -Discussed Qutenza as an option for neuropathic pain control. Discussed that this is a capsaicin patch, stronger than capsaicin cream. Discussed that it is currently approved for diabetic peripheral neuropathy and post-herpetic neuralgia, but that it has also shown benefit in treating other forms of neuropathy. Provided patient with link to site to learn more about the patch: https://www.clark.biz/. Discussed that the patch would be placed in office and benefits usually last 3 months. Discussed that unintended exposure to capsaicin can cause severe irritation of eyes, mucous membranes, respiratory tract, and skin, but that Qutenza is a local treatment and does not have the systemic side effects of other nerve medications. Discussed that there may be pain, itching, erythema, and decreased sensory function associated with the  application of Qutenza. Side effects usually subside within 1 week. A cold pack of analgesic medications can help with these side effects. Blood pressure can also be increased due to pain associated with administration of the patch.    2. Gait abnormality  Continue cane prn

## 2020-07-29 ENCOUNTER — Encounter: Payer: No Typology Code available for payment source | Admitting: Speech Pathology

## 2020-07-31 ENCOUNTER — Other Ambulatory Visit: Payer: Self-pay

## 2020-07-31 ENCOUNTER — Ambulatory Visit: Payer: No Typology Code available for payment source

## 2020-07-31 DIAGNOSIS — R1314 Dysphagia, pharyngoesophageal phase: Secondary | ICD-10-CM

## 2020-07-31 NOTE — Therapy (Signed)
Time 6    Period Weeks    Status Revised      SLP LONG TERM GOAL #3   Title Pt will complete RMT at 70% MIP/MEP to reduce s/s of apsiration and wet voice to less than 3x a meal    Time 6    Period Weeks    Status On-going      SLP LONG TERM GOAL #4   Title Spouse will verbalize 3 s/s of aspiration pna    Time 6    Period Weeks    Status On-going              Plan - 07/31/20 1139     Clinical Impression Statement William Kirk has progressed to requring intermittent cueing, visual and verbal, to carryover swallow precations of chin tuck to the left. Spouse is being trained to provide appropriate cueing and supervision to support William Kirk following precautions and completing HEP accurately. Due to memory issues, William Kirk will need ongoing cues from family to carrover diet recommendations and precautions. HEP continues to be variable, requiring usual to frequent mod to max A. Continue skilled ST to maximize safety of swallow and reduce risk of hospitalization from aspiration pna.    Speech Therapy Frequency 2x / week    Duration 8 weeks   17 visits   Treatment/Interventions Aspiration precaution training;Environmental controls;Oral motor exercises;SLP instruction and feedback;Compensatory techniques;Cognitive reorganization;Functional tasks;Pharyngeal strengthening exercises;Diet toleration management by SLP;Trials of upgraded texture/liquids;Internal/external aids;Patient/family education;Compensatory strategies;Other (comment)    Potential to Achieve Goals Good    Potential Considerations Ability to learn/carryover information             Patient will benefit from skilled therapeutic intervention in order to improve the following deficits and impairments:   Dysphagia, pharyngoesophageal phase    Problem  List Patient Active Problem List   Diagnosis Date Noted   Abnormality of gait 04/16/2020   Muscle cramp 04/16/2020   Facet arthropathy 03/17/2020   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   Empyema, right ([place]) 11/09/2018   Empyema ([place]) 11/06/2018   Pressure injury of skin 11/06/2018   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   Hemoptysis 07/07/2018   Acute renal failure (ARF) ([place]) 07/07/2018   Acute respiratory failure ([place]) 07/07/2018   Chronic obstructive pulmonary disease ([place]) 11/07/2016   Chronic respiratory failure with hypoxia ([place]) 11/07/2016   Pulmonary infiltrates 11/07/2016   Swallowing dysfunction 09/26/2016   Depression 09/21/2016   Aspiration pneumonia ([place]) 09/21/2016   HTN (hypertension) 09/21/2016   HCAP (healthcare-associated pneumonia)    COPD with acute exacerbation ([place]) 06/21/2016   Acute respiratory failure with hypoxia ([place]) 06/21/2016   Hypokalemia 06/21/2016   Hyponatremia 06/21/2016   Thrombocytosis 06/21/2016   PVD (peripheral vascular disease) ([place]) 06/21/2016   Bronchiectasis ([place]) 06/21/2016   Protein-calorie malnutrition, severe ([place]) 06/21/2016   Multifocal pneumonia 06/20/2016   FUO (fever of unknown origin) 04/15/2015   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 [place], [place], [address] Phone: [phone]   Fax:  [phone]   Name: William Kirk MRN: [id number] Date of Birth: May 18, 1940  [place] [address] [place], [place], [address] Phone: [phone]   Fax:  [phone]  Speech Language Pathology Treatment  Patient Details  Name: William Kirk MRN: [id number] Date of Birth: Jun 24, 1940 Referring Provider (SLP): Dr. Patrina Levering   Encounter Date: 07/31/2020   End of Session - 07/31/20 1138     Visit Number 6    Number of Visits 17    Date for SLP Re-Evaluation 08/19/20    SLP Start Time 1017    SLP Stop Time  1059    SLP Time Calculation (min) 42 min    Activity Tolerance Patient tolerated treatment well             Past Medical History:  Diagnosis Date   Anxiety    Arthritis    "hands" (09/21/2016)   Bleeding stomach ulcer 2000s   COPD (chronic obstructive pulmonary disease) ([place])    Cough    Depression    Dyspnea    Hypertension    Pneumonia    "now and several times before this" (09/21/2016)   PVD (peripheral vascular disease) ([place]) 2014   prev PTCA on RLE    Past Surgical History:  Procedure Laterality Date   IR ANGIOGRAM SELECTIVE EACH ADDITIONAL VESSEL  07/26/2018   IR ANGIOGRAM SELECTIVE EACH ADDITIONAL VESSEL  07/26/2018   IR ANGIOGRAM SELECTIVE EACH ADDITIONAL VESSEL  07/26/2018   IR EMBO ART  VEN HEMORR LYMPH EXTRAV  INC GUIDE ROADMAPPING  07/26/2018   IR GASTROSTOMY TUBE MOD SED  12/14/2018   IR US GUIDE VASC ACCESS RIGHT  07/26/2018   JOINT REPLACEMENT     KNEE ARTHROSCOPY Right X 1   TOTAL HIP ARTHROPLASTY Right 2009   VIDEO ASSISTED THORACOSCOPY (VATS)/ LOBECTOMY Right 10/18/2018   Procedure: VIDEO ASSISTED THORACOSCOPY (VATS)/ right lower LOBECTOMY;  Surgeon: Melrose Nakayama, MD;  Location: [place];  Service: Thoracic;  Laterality: Right;   VIDEO ASSISTED THORACOSCOPY (VATS)/DECORTICATION Right 11/09/2018   Procedure: REDO VIDEO ASSISTED THORACOSCOPY (VATS)/DECORTICATION/DRAIN EFFUSION;  Surgeon: Melrose Nakayama, MD;  Location: [place];  Service: Thoracic;   Laterality: Right;   VIDEO BRONCHOSCOPY N/A 07/27/2018   Procedure: VIDEO BRONCHOSCOPY WITHOUT FLUORO;  Surgeon: Collene Gobble, MD;  Location: [place];  Service: Cardiopulmonary;  Laterality: N/A;   VIDEO BRONCHOSCOPY N/A 11/09/2018   Procedure: VIDEO BRONCHOSCOPY;  Surgeon: Melrose Nakayama, MD;  Location: [facility] OR;  Service: Thoracic;  Laterality: N/A;    There were no vitals filed for this visit.   Subjective Assessment - 07/31/20 1037     Subjective "He still can't do #4 (mendelsohn)."    Patient is accompained by: Family member   [name]-wife   Currently in Pain? Yes                   ADULT SLP TREATMENT - 07/31/20 1041       General Information   Behavior/Cognition Alert;Cooperative;Pleasant mood;Requires cueing      Treatment Provided   Treatment provided Cognitive-Linquistic      Dysphagia Treatment   Temperature Spikes Noted No    Other treatment/comments Reinaldo req'd  consistent mod A with HEP, including chin push back, hard swallow, tongue press, and pitch raise ("who" instead of "he"). He req'd max cues consistently with EMST SLP abandoned Caryl Ada, with as little success as pt is still having with procedurally trying to accmoplish this correctly. SLP educated pt's wife and pt on chin tuck against resistance ("pool noodle exercise") and  [facility] [address] [place], [place], [address] Phone: [phone]   Fax:  [phone]  Speech Language Pathology Treatment  Patient Details  Name: William Kirk MRN: [id number] Date of Birth: Jun 24, 1940 Referring Provider (SLP): Dr. Patrina Levering   Encounter Date: 07/31/2020   End of Session - 07/31/20 1138     Visit Number 6    Number of Visits 17    Date for SLP Re-Evaluation 08/19/20    SLP Start Time 1017    SLP Stop Time  1059    SLP Time Calculation (min) 42 min    Activity Tolerance Patient tolerated treatment well             Past Medical History:  Diagnosis Date   Anxiety    Arthritis    "hands" (09/21/2016)   Bleeding stomach ulcer 2000s   COPD (chronic obstructive pulmonary disease) ([place])    Cough    Depression    Dyspnea    Hypertension    Pneumonia    "now and several times before this" (09/21/2016)   PVD (peripheral vascular disease) ([place]) 2014   prev PTCA on RLE    Past Surgical History:  Procedure Laterality Date   IR ANGIOGRAM SELECTIVE EACH ADDITIONAL VESSEL  07/26/2018   IR ANGIOGRAM SELECTIVE EACH ADDITIONAL VESSEL  07/26/2018   IR ANGIOGRAM SELECTIVE EACH ADDITIONAL VESSEL  07/26/2018   IR EMBO ART  VEN HEMORR LYMPH EXTRAV  INC GUIDE ROADMAPPING  07/26/2018   IR GASTROSTOMY TUBE MOD SED  12/14/2018   IR US GUIDE VASC ACCESS RIGHT  07/26/2018   JOINT REPLACEMENT     KNEE ARTHROSCOPY Right X 1   TOTAL HIP ARTHROPLASTY Right 2009   VIDEO ASSISTED THORACOSCOPY (VATS)/ LOBECTOMY Right 10/18/2018   Procedure: VIDEO ASSISTED THORACOSCOPY (VATS)/ right lower LOBECTOMY;  Surgeon: Melrose Nakayama, MD;  Location: [place];  Service: Thoracic;  Laterality: Right;   VIDEO ASSISTED THORACOSCOPY (VATS)/DECORTICATION Right 11/09/2018   Procedure: REDO VIDEO ASSISTED THORACOSCOPY (VATS)/DECORTICATION/DRAIN EFFUSION;  Surgeon: Melrose Nakayama, MD;  Location: [facility];  Service: Thoracic;   Laterality: Right;   VIDEO BRONCHOSCOPY N/A 07/27/2018   Procedure: VIDEO BRONCHOSCOPY WITHOUT FLUORO;  Surgeon: Collene Gobble, MD;  Location: [facility];  Service: Cardiopulmonary;  Laterality: N/A;   VIDEO BRONCHOSCOPY N/A 11/09/2018   Procedure: VIDEO BRONCHOSCOPY;  Surgeon: Melrose Nakayama, MD;  Location: [facility] OR;  Service: Thoracic;  Laterality: N/A;    There were no vitals filed for this visit.   Subjective Assessment - 07/31/20 1037     Subjective "He still can't do #4 (mendelsohn)."    Patient is accompained by: Family member   [name]-wife   Currently in Pain? Yes                   ADULT SLP TREATMENT - 07/31/20 1041       General Information   Behavior/Cognition Alert;Cooperative;Pleasant mood;Requires cueing      Treatment Provided   Treatment provided Cognitive-Linquistic      Dysphagia Treatment   Temperature Spikes Noted No    Other treatment/comments Reinaldo req'd  consistent mod A with HEP, including chin push back, hard swallow, tongue press, and pitch raise ("who" instead of "he"). He req'd max cues consistently with EMST SLP abandoned Caryl Ada, with as little success as pt is still having with procedurally trying to accmoplish this correctly. SLP educated pt's wife and pt on chin tuck against resistance ("pool noodle exercise") and

## 2020-08-05 ENCOUNTER — Other Ambulatory Visit: Payer: Self-pay

## 2020-08-05 ENCOUNTER — Ambulatory Visit: Payer: No Typology Code available for payment source

## 2020-08-05 DIAGNOSIS — R1314 Dysphagia, pharyngoesophageal phase: Secondary | ICD-10-CM

## 2020-08-05 NOTE — Therapy (Signed)
[facility] [address] [place], [place], [address] Phone: [phone]   Fax:  [phone]  Speech Language Pathology Treatment  Patient Details  Name: William Kirk MRN: [id number] Date of Birth: 1940/12/27 Referring Provider (SLP): Dr. Patrina Levering   Encounter Date: 08/05/2020   End of Session - 08/05/20 1248     Visit Number 7    Number of Visits 17    Date for SLP Re-Evaluation 08/19/20    SLP Start Time 5625    SLP Stop Time  1100    SLP Time Calculation (min) 41 min    Activity Tolerance Patient tolerated treatment well             Past Medical History:  Diagnosis Date   Anxiety    Arthritis    "hands" (09/21/2016)   Bleeding stomach ulcer 2000s   COPD (chronic obstructive pulmonary disease) ([place])    Cough    Depression    Dyspnea    Hypertension    Pneumonia    "now and several times before this" (09/21/2016)   PVD (peripheral vascular disease) ([place]) 2014   prev PTCA on RLE    Past Surgical History:  Procedure Laterality Date   IR ANGIOGRAM SELECTIVE EACH ADDITIONAL VESSEL  07/26/2018   IR ANGIOGRAM SELECTIVE EACH ADDITIONAL VESSEL  07/26/2018   IR ANGIOGRAM SELECTIVE EACH ADDITIONAL VESSEL  07/26/2018   IR EMBO ART  VEN HEMORR LYMPH EXTRAV  INC GUIDE ROADMAPPING  07/26/2018   IR GASTROSTOMY TUBE MOD SED  12/14/2018   IR US GUIDE VASC ACCESS RIGHT  07/26/2018   JOINT REPLACEMENT     KNEE ARTHROSCOPY Right X 1   TOTAL HIP ARTHROPLASTY Right 2009   VIDEO ASSISTED THORACOSCOPY (VATS)/ LOBECTOMY Right 10/18/2018   Procedure: VIDEO ASSISTED THORACOSCOPY (VATS)/ right lower LOBECTOMY;  Surgeon: Melrose Nakayama, MD;  Location: [facility];  Service: Thoracic;  Laterality: Right;   VIDEO ASSISTED THORACOSCOPY (VATS)/DECORTICATION Right 11/09/2018   Procedure: REDO VIDEO ASSISTED THORACOSCOPY (VATS)/DECORTICATION/DRAIN EFFUSION;  Surgeon: Melrose Nakayama, MD;  Location: [facility] [place];  Service: Thoracic;   Laterality: Right;   VIDEO BRONCHOSCOPY N/A 07/27/2018   Procedure: VIDEO BRONCHOSCOPY WITHOUT FLUORO;  Surgeon: Collene Gobble, MD;  Location: [facility];  Service: Cardiopulmonary;  Laterality: N/A;   VIDEO BRONCHOSCOPY N/A 11/09/2018   Procedure: VIDEO BRONCHOSCOPY;  Surgeon: Melrose Nakayama, MD;  Location: [facility] OR;  Service: Thoracic;  Laterality: N/A;    There were no vitals filed for this visit.   Subjective Assessment - 08/05/20 1037     Subjective Wife has made a exercise checklist for pt which he uses - if he does HEP.    Patient is accompained by: Family member   [name]-wife   Currently in Pain? Yes    Pain Score 8     Pain Location Hip    Pain Orientation Right    Pain Descriptors / Indicators Sore                   ADULT SLP TREATMENT - 08/05/20 1102       General Information   Behavior/Cognition Alert;Cooperative;Pleasant mood;Requires cueing      Treatment Provided   Treatment provided Cognitive-Linquistic      Dysphagia Treatment   Treatment Methods Skilled observation;Therapeutic exercise;Patient/caregiver education    Patient observed directly with PO's No    Other treatment/comments Pt/wife did not bring ball for chin tuck against resistance (CTAR). 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

## 2020-08-07 ENCOUNTER — Ambulatory Visit: Payer: No Typology Code available for payment source

## 2020-08-12 ENCOUNTER — Ambulatory Visit: Payer: No Typology Code available for payment source

## 2020-08-14 ENCOUNTER — Ambulatory Visit: Payer: No Typology Code available for payment source | Attending: Neurology

## 2020-08-14 ENCOUNTER — Other Ambulatory Visit: Payer: Self-pay

## 2020-08-14 DIAGNOSIS — R1314 Dysphagia, pharyngoesophageal phase: Secondary | ICD-10-CM | POA: Diagnosis present

## 2020-08-14 NOTE — Therapy (Signed)
[facility] [address] [place], [place], [address] Phone: [phone]   Fax:  [phone]  Speech Language Pathology Treatment  Patient Details  Name: William Kirk MRN: [id number] Date of Birth: Sep 16, 1940 Referring Provider (SLP): Dr. Patrina Levering   Encounter Date: 08/14/2020   End of Session - 08/14/20 1103     Visit Number 8    Number of Visits 17    Date for SLP Re-Evaluation 09/23/20    SLP Start Time 1020    SLP Stop Time  1100    SLP Time Calculation (min) 40 min    Activity Tolerance Patient tolerated treatment well             Past Medical History:  Diagnosis Date   Anxiety    Arthritis    "hands" (09/21/2016)   Bleeding stomach ulcer 2000s   COPD (chronic obstructive pulmonary disease) ([place])    Cough    Depression    Dyspnea    Hypertension    Pneumonia    "now and several times before this" (09/21/2016)   PVD (peripheral vascular disease) ([place]) 2014   prev PTCA on RLE    Past Surgical History:  Procedure Laterality Date   IR ANGIOGRAM SELECTIVE EACH ADDITIONAL VESSEL  07/26/2018   IR ANGIOGRAM SELECTIVE EACH ADDITIONAL VESSEL  07/26/2018   IR ANGIOGRAM SELECTIVE EACH ADDITIONAL VESSEL  07/26/2018   IR EMBO ART  VEN HEMORR LYMPH EXTRAV  INC GUIDE ROADMAPPING  07/26/2018   IR GASTROSTOMY TUBE MOD SED  12/14/2018   IR US GUIDE VASC ACCESS RIGHT  07/26/2018   JOINT REPLACEMENT     KNEE ARTHROSCOPY Right X 1   TOTAL HIP ARTHROPLASTY Right 2009   VIDEO ASSISTED THORACOSCOPY (VATS)/ LOBECTOMY Right 10/18/2018   Procedure: VIDEO ASSISTED THORACOSCOPY (VATS)/ right lower LOBECTOMY;  Surgeon: Melrose Nakayama, MD;  Location: [facility];  Service: Thoracic;  Laterality: Right;   VIDEO ASSISTED THORACOSCOPY (VATS)/DECORTICATION Right 11/09/2018   Procedure: REDO VIDEO ASSISTED THORACOSCOPY (VATS)/DECORTICATION/DRAIN EFFUSION;  Surgeon: Melrose Nakayama, MD;  Location: [facility];  Service: Thoracic;   Laterality: Right;   VIDEO BRONCHOSCOPY N/A 07/27/2018   Procedure: VIDEO BRONCHOSCOPY WITHOUT FLUORO;  Surgeon: Collene Gobble, MD;  Location: [facility];  Service: Cardiopulmonary;  Laterality: N/A;   VIDEO BRONCHOSCOPY N/A 11/09/2018   Procedure: VIDEO BRONCHOSCOPY;  Surgeon: Melrose Nakayama, MD;  Location: [facility] OR;  Service: Thoracic;  Laterality: N/A;    There were no vitals filed for this visit.   Subjective Assessment - 08/14/20 1032     Subjective "He had a little infection (in the lungs)." Pt is on anitbiotics now, began yesterday.    Patient is accompained by: Family member   [name]-wife   Currently in Pain? Yes    Pain Score 8     Pain Location Hip    Pain Orientation Right    Pain Descriptors / Indicators Sore                   ADULT SLP TREATMENT - 08/14/20 1036       General Information   Behavior/Cognition Alert;Cooperative;Pleasant mood;Requires cueing      Treatment Provided   Treatment provided Cognitive-Linquistic      Dysphagia Treatment   Temperature Spikes Noted No    Other treatment/comments [name] indicated that MD did not say pt's lung infection was PNA specifically. Pt is now coughing less than he was last week - but that EMST "brings  [facility] [address] [place], [place], [address] Phone: [phone]   Fax:  [phone]  Speech Language Pathology Treatment  Patient Details  Name: William Kirk MRN: [id number] Date of Birth: Sep 16, 1940 Referring Provider (SLP): Dr. Patrina Levering   Encounter Date: 08/14/2020   End of Session - 08/14/20 1103     Visit Number 8    Number of Visits 17    Date for SLP Re-Evaluation 09/23/20    SLP Start Time 1020    SLP Stop Time  1100    SLP Time Calculation (min) 40 min    Activity Tolerance Patient tolerated treatment well             Past Medical History:  Diagnosis Date   Anxiety    Arthritis    "hands" (09/21/2016)   Bleeding stomach ulcer 2000s   COPD (chronic obstructive pulmonary disease) ([place])    Cough    Depression    Dyspnea    Hypertension    Pneumonia    "now and several times before this" (09/21/2016)   PVD (peripheral vascular disease) ([place]) 2014   prev PTCA on RLE    Past Surgical History:  Procedure Laterality Date   IR ANGIOGRAM SELECTIVE EACH ADDITIONAL VESSEL  07/26/2018   IR ANGIOGRAM SELECTIVE EACH ADDITIONAL VESSEL  07/26/2018   IR ANGIOGRAM SELECTIVE EACH ADDITIONAL VESSEL  07/26/2018   IR EMBO ART  VEN HEMORR LYMPH EXTRAV  INC GUIDE ROADMAPPING  07/26/2018   IR GASTROSTOMY TUBE MOD SED  12/14/2018   IR US GUIDE VASC ACCESS RIGHT  07/26/2018   JOINT REPLACEMENT     KNEE ARTHROSCOPY Right X 1   TOTAL HIP ARTHROPLASTY Right 2009   VIDEO ASSISTED THORACOSCOPY (VATS)/ LOBECTOMY Right 10/18/2018   Procedure: VIDEO ASSISTED THORACOSCOPY (VATS)/ right lower LOBECTOMY;  Surgeon: Melrose Nakayama, MD;  Location: [facility];  Service: Thoracic;  Laterality: Right;   VIDEO ASSISTED THORACOSCOPY (VATS)/DECORTICATION Right 11/09/2018   Procedure: REDO VIDEO ASSISTED THORACOSCOPY (VATS)/DECORTICATION/DRAIN EFFUSION;  Surgeon: Melrose Nakayama, MD;  Location: [facility];  Service: Thoracic;   Laterality: Right;   VIDEO BRONCHOSCOPY N/A 07/27/2018   Procedure: VIDEO BRONCHOSCOPY WITHOUT FLUORO;  Surgeon: Collene Gobble, MD;  Location: [facility];  Service: Cardiopulmonary;  Laterality: N/A;   VIDEO BRONCHOSCOPY N/A 11/09/2018   Procedure: VIDEO BRONCHOSCOPY;  Surgeon: Melrose Nakayama, MD;  Location: [facility] OR;  Service: Thoracic;  Laterality: N/A;    There were no vitals filed for this visit.   Subjective Assessment - 08/14/20 1032     Subjective "He had a little infection (in the lungs)." Pt is on anitbiotics now, began yesterday.    Patient is accompained by: Family member   [name]-wife   Currently in Pain? Yes    Pain Score 8     Pain Location Hip    Pain Orientation Right    Pain Descriptors / Indicators Sore                   ADULT SLP TREATMENT - 08/14/20 1036       General Information   Behavior/Cognition Alert;Cooperative;Pleasant mood;Requires cueing      Treatment Provided   Treatment provided Cognitive-Linquistic      Dysphagia Treatment   Temperature Spikes Noted No    Other treatment/comments [name] indicated that MD did not say pt's lung infection was PNA specifically. Pt is now coughing less than he was last week - but that EMST "brings  tuck to the left. Spouse will be observed by SLP next session to have SLP assess wife's appropriate cueing and supervision to support Amos following precautions and completing HEP accurately. Due to memory issues, Trace will need ongoing cues from family to carrover diet recommendations and precautions.  Continue skilled ST to maximize safety of swallow and reduce risk of hospitalization from aspiration pna. Expect d/c in 2-4 sessions.    Speech Therapy Frequency 2x / week    Duration 8 weeks   17 visits   Treatment/Interventions Aspiration precaution training;Environmental controls;Oral motor exercises;SLP instruction and feedback;Compensatory techniques;Cognitive reorganization;Functional tasks;Pharyngeal strengthening exercises;Diet toleration management by SLP;Trials of upgraded texture/liquids;Internal/external aids;Patient/family education;Compensatory strategies;Other (comment)    Potential to Achieve Goals Good    Potential Considerations Ability to learn/carryover information             Patient will benefit from skilled therapeutic intervention in order to improve the following deficits and impairments:   Dysphagia, pharyngoesophageal phase    Problem List Patient Active Problem List   Diagnosis Date Noted   Abnormality of gait 04/16/2020   Muscle cramp 04/16/2020   Facet arthropathy 03/17/2020   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   Empyema, right ([place]) 11/09/2018   Empyema ([place]) 11/06/2018   Pressure injury of skin 11/06/2018   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   Hemoptysis 07/07/2018   Acute renal failure (ARF) ([place]) 07/07/2018   Acute respiratory failure ([place]) 07/07/2018   Chronic obstructive pulmonary disease ([place]) 11/07/2016   Chronic respiratory failure with hypoxia ([place]) 11/07/2016   Pulmonary infiltrates 11/07/2016   Swallowing dysfunction 09/26/2016   Depression 09/21/2016    Aspiration pneumonia ([place]) 09/21/2016   HTN (hypertension) 09/21/2016   HCAP (healthcare-associated pneumonia)    COPD with acute exacerbation ([place]) 06/21/2016   Acute respiratory failure with hypoxia ([place]) 06/21/2016   Hypokalemia 06/21/2016   Hyponatremia 06/21/2016   Thrombocytosis 06/21/2016   PVD (peripheral vascular disease) ([place]) 06/21/2016   Bronchiectasis ([place]) 06/21/2016   Protein-calorie malnutrition, severe ([place]) 06/21/2016   Multifocal pneumonia 06/20/2016   FUO (fever of unknown origin) 04/15/2015   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

## 2020-08-19 ENCOUNTER — Ambulatory Visit: Payer: No Typology Code available for payment source

## 2020-08-19 ENCOUNTER — Other Ambulatory Visit: Payer: Self-pay

## 2020-08-19 DIAGNOSIS — R1314 Dysphagia, pharyngoesophageal phase: Secondary | ICD-10-CM | POA: Diagnosis not present

## 2020-08-19 NOTE — Therapy (Signed)
[facility] [address] [place], [place], [address] Phone: [phone]   Fax:  [phone]  Speech Language Pathology Treatment  Patient Details  Name: William Kirk MRN: [id number] Date of Birth: 11-24-1940 Referring Provider (SLP): Dr. Randel Books   Encounter Date: 08/19/2020   End of Session - 08/19/20 1714     Visit Number 9    Number of Visits 17    Date for SLP Re-Evaluation 09/23/20    SLP Start Time 1020    SLP Stop Time  1100    SLP Time Calculation (min) 40 min    Activity Tolerance Patient tolerated treatment well             Past Medical History:  Diagnosis Date   Anxiety    Arthritis    "hands" (09/21/2016)   Bleeding stomach ulcer 2000s   COPD (chronic obstructive pulmonary disease) ([facility])    Cough    Depression    Dyspnea    Hypertension    Pneumonia    "now and several times before this" (09/21/2016)   PVD (peripheral vascular disease) ([facility]) 2014   prev PTCA on RLE    Past Surgical History:  Procedure Laterality Date   IR ANGIOGRAM SELECTIVE EACH ADDITIONAL VESSEL  07/26/2018   IR ANGIOGRAM SELECTIVE EACH ADDITIONAL VESSEL  07/26/2018   IR ANGIOGRAM SELECTIVE EACH ADDITIONAL VESSEL  07/26/2018   IR EMBO ART  VEN HEMORR LYMPH EXTRAV  INC GUIDE ROADMAPPING  07/26/2018   IR GASTROSTOMY TUBE MOD SED  12/14/2018   IR US GUIDE VASC ACCESS RIGHT  07/26/2018   JOINT REPLACEMENT     KNEE ARTHROSCOPY Right X 1   TOTAL HIP ARTHROPLASTY Right 2009   VIDEO ASSISTED THORACOSCOPY (VATS)/ LOBECTOMY Right 10/18/2018   Procedure: VIDEO ASSISTED THORACOSCOPY (VATS)/ right lower LOBECTOMY;  Surgeon: Loreli Slot, MD;  Location: [facility] OR;  Service: Thoracic;  Laterality: Right;   VIDEO ASSISTED THORACOSCOPY (VATS)/DECORTICATION Right 11/09/2018   Procedure: REDO VIDEO ASSISTED THORACOSCOPY (VATS)/DECORTICATION/DRAIN EFFUSION;  Surgeon: Loreli Slot, MD;  Location: [facility] OR;  Service: Thoracic;   Laterality: Right;   VIDEO BRONCHOSCOPY N/A 07/27/2018   Procedure: VIDEO BRONCHOSCOPY WITHOUT FLUORO;  Surgeon: Leslye Peer, MD;  Location: [facility] OR;  Service: Cardiopulmonary;  Laterality: N/A;   VIDEO BRONCHOSCOPY N/A 11/09/2018   Procedure: VIDEO BRONCHOSCOPY;  Surgeon: Loreli Slot, MD;  Location: [facility] OR;  Service: Thoracic;  Laterality: N/A;    There were no vitals filed for this visit.   Subjective Assessment - 08/19/20 1027     Subjective William Kirk brought the rolled up towel    Patient is accompained by: Family member   William Kirk -wife   Currently in Pain? Yes    Pain Score 7     Pain Location Back                   ADULT SLP TREATMENT - 08/19/20 1707       General Information   Behavior/Cognition Alert;Cooperative;Pleasant mood;Requires cueing      Treatment Provided   Treatment provided Dysphagia      Dysphagia Treatment   Other treatment/comments Wife did not bring EMST to session today. SLP observed wife with pt to go through HEP. William Kirk provided appropriate cues at appropriate times and SLP encouraged wife that she was doing excellent job with this. SLP provided small pointers such as pt open his mouth slightly with tongue press so wife can observe lingual motion, and  cue "squeeze the towel with your chin" instead of "press it" due to pt confused with pressing the towel to his neck instead of his chin down on the towel. Pt held hold times too long most of the time so SLP guided wife to cound "1000-1, 1000-2, etc" for pt in hopes of having this be his routine as well. SLP lastly, provided a checklist for exercises should she decide pt (or she) would benefit from using this at home. William Kirk otld SLP 3 overt s/sx aspiration pNA with modified independence.      Assessment / Recommendations / Plan   Plan Continue with current plan of care   if situation is good on Friday ,move to x1/week; possibly adjust EMST     Dysphagia Recommendations   Diet recommendations Dysphagia 3  (mechanical soft);Thin liquid    Liquids provided via Cup    Medication Administration Whole meds with puree    Supervision Patient able to self feed;Intermittent supervision to cue for compensatory strategies    Compensations Clear throat after each swallow    Postural Changes and/or Swallow Maneuvers Chin tuck;Head tilt left during swallow      Progression Toward Goals   Progression toward goals Progressing toward goals              SLP Education - 08/19/20 1713     Education Details see note from today for education provided to wife    Person(s) Educated Patient;Spouse    Methods Explanation;Demonstration    Comprehension Verbalized understanding;Returned demonstration;Need further instruction              SLP Short Term Goals - 08/14/20 1743       SLP SHORT TERM GOAL #1   Title Pt will follow swallow precautions with usual min A over 2 sessions    Status Not Met      SLP SHORT TERM GOAL #2   Title Pt will complete HEP for dysphagia with usual mod A over 2 sessions    Baseline 08-05-20    Status Partially Met      SLP SHORT TERM GOAL #3   Title Pt will complete RMT 2 sets of 25 reps at 70% of MEP/MIP with rare min A    Status Not Met              SLP Long Term Goals - 08/19/20 1716       SLP LONG TERM GOAL #1   Title Pt will follow swallow protocol with occasoinal min A from spouse over 2 weeks    Time 3    Period Weeks    Status On-going      SLP LONG TERM GOAL #2   Title Pt will complete HEP for dysphagia with occasional min-mod A over 2 sessions    Time 3    Period Weeks    Status On-going      SLP LONG TERM GOAL #3   Title Pt will complete RMT at 70% MIP/MEP to reduce s/s of apsiration and wet voice to less than 3x a meal    Time 4    Period Weeks    Status On-going      SLP LONG TERM GOAL #4   Title Spouse will verbalize 3 s/s of aspiration pna    Status Achieved              Plan - 08/19/20 1714     Clinical Impression  Statement William Kirk has progressed to requring intermittent cueing,  visual and verbal, to carryover swallow precations of chin tuck to the left. Spouse was observed by SLP and observed wife's appropriate cueing and supervision for completing HEP accurately. Due to memory issues, Stephen will need ongoing cues from family to carrover diet recommendations and precautions.  Continue skilled ST to maximize safety of swallow and reduce risk of hospitalization from aspiration pna. Expect d/c in 2-4 sessions.    Speech Therapy Frequency 2x / week    Duration 8 weeks   17 visits   Treatment/Interventions Aspiration precaution training;Environmental controls;Oral motor exercises;SLP instruction and feedback;Compensatory techniques;Cognitive reorganization;Functional tasks;Pharyngeal strengthening exercises;Diet toleration management by SLP;Trials of upgraded texture/liquids;Internal/external aids;Patient/family education;Compensatory strategies;Other (comment)    Potential to Achieve Goals Good    Potential Considerations Ability to learn/carryover information             Patient will benefit from skilled therapeutic intervention in order to improve the following deficits and impairments:   Dysphagia, pharyngoesophageal phase    Problem List Patient Active Problem List   Diagnosis Date Noted   Abnormality of gait 04/16/2020   Muscle cramp 04/16/2020   Facet arthropathy 03/17/2020   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   Empyema, right ([facility]) 11/09/2018   Empyema ([facility]) 11/06/2018   Pressure injury of skin 11/06/2018   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   Hemoptysis 07/07/2018   Acute renal failure (ARF) ([facility]) 07/07/2018   Acute respiratory failure ([facility]) 07/07/2018   Chronic obstructive pulmonary disease ([facility]) 11/07/2016   Chronic respiratory failure with hypoxia ([facility]) 11/07/2016   Pulmonary infiltrates 11/07/2016   Swallowing dysfunction 09/26/2016   Depression  09/21/2016   Aspiration pneumonia ([facility]) 09/21/2016   HTN (hypertension) 09/21/2016   HCAP (healthcare-associated pneumonia)    COPD with acute exacerbation ([facility]) 06/21/2016   Acute respiratory failure with hypoxia ([facility]) 06/21/2016   Hypokalemia 06/21/2016   Hyponatremia 06/21/2016   Thrombocytosis 06/21/2016   PVD (peripheral vascular disease) ([facility]) 06/21/2016   Bronchiectasis ([facility]) 06/21/2016   Protein-calorie malnutrition, severe ([facility]) 06/21/2016   Multifocal pneumonia 06/20/2016   FUO (fever of unknown origin) 04/15/2015   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

## 2020-08-19 NOTE — Patient Instructions (Addendum)
  TWICE A DAY:  Hard Swallows   Tongue out and swallow    "Heeeeeeeee"  (june carter cash)   Squeeze the towel with your chin  60 seconds   Press your tongue up  5 seconds   Push your fist on your chin  5 seconds   Blow with the blower  5 sets of 5    ============================================================ Signs of Aspiration Pneumonia   Chest pain/tightness Fever (can be low grade) Cough  With foul-smelling phlegm (sputum) With sputum containing pus or blood With greenish sputum Fatigue  Shortness of breath  Wheezing   **IF YOU HAVE THESE SIGNS, CONTACT YOUR DOCTOR OR GO TO THE EMERGENCY DEPARTMENT OR URGENT CARE AS SOON AS POSSIBLE**

## 2020-08-20 ENCOUNTER — Ambulatory Visit: Payer: No Typology Code available for payment source | Admitting: Physical Medicine and Rehabilitation

## 2020-08-21 ENCOUNTER — Ambulatory Visit: Payer: No Typology Code available for payment source

## 2020-08-21 ENCOUNTER — Other Ambulatory Visit: Payer: Self-pay

## 2020-08-21 DIAGNOSIS — R1314 Dysphagia, pharyngoesophageal phase: Secondary | ICD-10-CM | POA: Diagnosis not present

## 2020-08-21 NOTE — Therapy (Signed)
d/c in 1-3 sessions.    Speech Therapy Frequency 1x /week    Duration 8 weeks   17 visits   Treatment/Interventions Aspiration precaution training;Environmental controls;Oral motor exercises;SLP instruction and feedback;Compensatory techniques;Cognitive reorganization;Functional tasks;Pharyngeal strengthening exercises;Diet toleration management by SLP;Trials of upgraded texture/liquids;Internal/external aids;Patient/family education;Compensatory strategies;Other (comment)    Potential to Achieve Goals Good    Potential Considerations Ability to learn/carryover information             Patient will benefit from skilled therapeutic intervention in order to improve the following deficits and impairments:   Dysphagia, pharyngoesophageal phase    Problem List Patient Active Problem List   Diagnosis Date Noted   Abnormality of gait 04/16/2020   Muscle cramp 04/16/2020   Facet arthropathy 03/17/2020   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   Empyema, right ([place]) 11/09/2018   Empyema ([place]) 11/06/2018   Pressure injury of skin 11/06/2018   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   Hemoptysis 07/07/2018   Acute renal failure (ARF) ([place]) 07/07/2018   Acute respiratory failure ([place]) 07/07/2018   Chronic obstructive pulmonary disease ([place]) 11/07/2016   Chronic respiratory failure with hypoxia ([place]) 11/07/2016   Pulmonary infiltrates 11/07/2016   Swallowing dysfunction 09/26/2016   Depression 09/21/2016   Aspiration pneumonia ([place]) 09/21/2016   HTN (hypertension) 09/21/2016   HCAP (healthcare-associated pneumonia)    COPD with acute exacerbation ([place]) 06/21/2016   Acute respiratory failure with hypoxia ([place])  06/21/2016   Hypokalemia 06/21/2016   Hyponatremia 06/21/2016   Thrombocytosis 06/21/2016   PVD (peripheral vascular disease) ([place]) 06/21/2016   Bronchiectasis ([place]) 06/21/2016   Protein-calorie malnutrition, severe ([place]) 06/21/2016   Multifocal pneumonia 06/20/2016   FUO (fever of unknown origin) 04/15/2015   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 ,[place], [place]  08/21/2020, 2:31 PM  [place] [address] [place], [place], [address] Phone: [phone]   Fax:  [phone]   Name: William Kirk MRN: [id number] Date of Birth: 1940-09-24  d/c in 1-3 sessions.    Speech Therapy Frequency 1x /week    Duration 8 weeks   17 visits   Treatment/Interventions Aspiration precaution training;Environmental controls;Oral motor exercises;SLP instruction and feedback;Compensatory techniques;Cognitive reorganization;Functional tasks;Pharyngeal strengthening exercises;Diet toleration management by SLP;Trials of upgraded texture/liquids;Internal/external aids;Patient/family education;Compensatory strategies;Other (comment)    Potential to Achieve Goals Good    Potential Considerations Ability to learn/carryover information             Patient will benefit from skilled therapeutic intervention in order to improve the following deficits and impairments:   Dysphagia, pharyngoesophageal phase    Problem List Patient Active Problem List   Diagnosis Date Noted   Abnormality of gait 04/16/2020   Muscle cramp 04/16/2020   Facet arthropathy 03/17/2020   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   Empyema, right ([place]) 11/09/2018   Empyema ([place]) 11/06/2018   Pressure injury of skin 11/06/2018   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   Hemoptysis 07/07/2018   Acute renal failure (ARF) ([place]) 07/07/2018   Acute respiratory failure ([place]) 07/07/2018   Chronic obstructive pulmonary disease ([place]) 11/07/2016   Chronic respiratory failure with hypoxia ([place]) 11/07/2016   Pulmonary infiltrates 11/07/2016   Swallowing dysfunction 09/26/2016   Depression 09/21/2016   Aspiration pneumonia ([place]) 09/21/2016   HTN (hypertension) 09/21/2016   HCAP (healthcare-associated pneumonia)    COPD with acute exacerbation ([place]) 06/21/2016   Acute respiratory failure with hypoxia ([place])  06/21/2016   Hypokalemia 06/21/2016   Hyponatremia 06/21/2016   Thrombocytosis 06/21/2016   PVD (peripheral vascular disease) ([place]) 06/21/2016   Bronchiectasis ([place]) 06/21/2016   Protein-calorie malnutrition, severe ([place]) 06/21/2016   Multifocal pneumonia 06/20/2016   FUO (fever of unknown origin) 04/15/2015   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 ,[place], [place]  08/21/2020, 2:31 PM  [place] [address] [place], [place], [address] Phone: [phone]   Fax:  [phone]   Name: William Kirk MRN: [id number] Date of Birth: 1940-09-24  [facility] [address] [place], [place], [address] Phone: [phone]   Fax:  [phone]  Speech Language Pathology Treatment/Progress note  Patient Details  Name: William Kirk MRN: [id number] Date of Birth: [birth year] Referring Provider (SLP): Dr. Patrina Levering   Encounter Date: 08/21/2020   End of Session - 08/21/20 1429     Visit Number 10    Number of Visits 17    Date for SLP Re-Evaluation 09/23/20    SLP Start Time 1020    SLP Stop Time  1100    SLP Time Calculation (min) 40 min    Activity Tolerance Patient tolerated treatment well             Past Medical History:  Diagnosis Date   Anxiety    Arthritis    "hands" (09/21/2016)   Bleeding stomach ulcer 2000s   COPD (chronic obstructive pulmonary disease) ([place])    Cough    Depression    Dyspnea    Hypertension    Pneumonia    "now and several times before this" (09/21/2016)   PVD (peripheral vascular disease) ([place]) 2014   prev PTCA on RLE    Past Surgical History:  Procedure Laterality Date   IR ANGIOGRAM SELECTIVE EACH ADDITIONAL VESSEL  07/26/2018   IR ANGIOGRAM SELECTIVE EACH ADDITIONAL VESSEL  07/26/2018   IR ANGIOGRAM SELECTIVE EACH ADDITIONAL VESSEL  07/26/2018   IR EMBO ART  VEN HEMORR LYMPH EXTRAV  INC GUIDE ROADMAPPING  07/26/2018   IR GASTROSTOMY TUBE MOD SED  12/14/2018   IR US GUIDE VASC ACCESS RIGHT  07/26/2018   JOINT REPLACEMENT     KNEE ARTHROSCOPY Right X 1   TOTAL HIP ARTHROPLASTY Right 2009   VIDEO ASSISTED THORACOSCOPY (VATS)/ LOBECTOMY Right 10/18/2018   Procedure: VIDEO ASSISTED THORACOSCOPY (VATS)/ right lower LOBECTOMY;  Surgeon: Melrose Nakayama, MD;  Location: [place];  Service: Thoracic;  Laterality: Right;   VIDEO ASSISTED THORACOSCOPY (VATS)/DECORTICATION Right 11/09/2018   Procedure: REDO VIDEO ASSISTED THORACOSCOPY (VATS)/DECORTICATION/DRAIN EFFUSION;  Surgeon: Melrose Nakayama, MD;  Location: [place];   Service: Thoracic;  Laterality: Right;   VIDEO BRONCHOSCOPY N/A 07/27/2018   Procedure: VIDEO BRONCHOSCOPY WITHOUT FLUORO;  Surgeon: Collene Gobble, MD;  Location: [place];  Service: Cardiopulmonary;  Laterality: N/A;   VIDEO BRONCHOSCOPY N/A 11/09/2018   Procedure: VIDEO BRONCHOSCOPY;  Surgeon: Melrose Nakayama, MD;  Location: [facility];  Service: Thoracic;  Laterality: N/A;      Speech Therapy Progress Note  Dates of Reporting Period: 06-24-20 to present  Subjective Statement: Pt seen for 10 sessions overall for dysphagia.  Objective: See below.  Goal Update: See below.  Plan: See below.  Reason Skilled Services are Required: SLP needs assurance that wife is able to adequately cue pt over time.    There were no vitals filed for this visit.   Subjective Assessment - 08/21/20 1050     Subjective EMST brought today. [name] reports she had to provide cues for chin tuck and left consistently last night at supper.    Patient is accompained by: --   jan - wife                  ADULT SLP TREATMENT - 08/21/20 1040       General Information   Behavior/Cognition Alert;Cooperative;Pleasant mood;Requires cueing      Treatment Provided   Treatment provided Dysphagia      Dysphagia Treatment   Temperature Spikes Noted No

## 2020-08-28 ENCOUNTER — Other Ambulatory Visit: Payer: Self-pay

## 2020-08-28 ENCOUNTER — Ambulatory Visit: Payer: No Typology Code available for payment source

## 2020-08-28 DIAGNOSIS — R1314 Dysphagia, pharyngoesophageal phase: Secondary | ICD-10-CM | POA: Diagnosis not present

## 2020-08-28 NOTE — Therapy (Signed)
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   Hemoptysis 07/07/2018   Acute renal failure (ARF) ([place]) 07/07/2018   Acute respiratory failure ([place]) 07/07/2018   Chronic obstructive pulmonary disease ([place]) 11/07/2016   Chronic respiratory failure with hypoxia ([place]) 11/07/2016   Pulmonary infiltrates 11/07/2016   Swallowing dysfunction 09/26/2016   Depression 09/21/2016   Aspiration pneumonia ([place]) 09/21/2016   HTN (hypertension) 09/21/2016   HCAP (healthcare-associated pneumonia)    COPD with acute exacerbation ([place]) 06/21/2016   Acute respiratory failure with hypoxia ([place]) 06/21/2016   Hypokalemia 06/21/2016   Hyponatremia 06/21/2016   Thrombocytosis 06/21/2016   PVD (peripheral vascular disease) ([place]) 06/21/2016   Bronchiectasis ([place]) 06/21/2016   Protein-calorie malnutrition, severe ([place]) 06/21/2016   Multifocal pneumonia 06/20/2016   FUO (fever of unknown origin) 04/15/2015   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 [place], [place], [address] Phone: [phone]   Fax:  [phone]   Name: William Kirk MRN: [id number] Date of Birth: [birth year]  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   Hemoptysis 07/07/2018   Acute renal failure (ARF) ([place]) 07/07/2018   Acute respiratory failure ([place]) 07/07/2018   Chronic obstructive pulmonary disease ([place]) 11/07/2016   Chronic respiratory failure with hypoxia ([place]) 11/07/2016   Pulmonary infiltrates 11/07/2016   Swallowing dysfunction 09/26/2016   Depression 09/21/2016   Aspiration pneumonia ([place]) 09/21/2016   HTN (hypertension) 09/21/2016   HCAP (healthcare-associated pneumonia)    COPD with acute exacerbation ([place]) 06/21/2016   Acute respiratory failure with hypoxia ([place]) 06/21/2016   Hypokalemia 06/21/2016   Hyponatremia 06/21/2016   Thrombocytosis 06/21/2016   PVD (peripheral vascular disease) ([place]) 06/21/2016   Bronchiectasis ([place]) 06/21/2016   Protein-calorie malnutrition, severe ([place]) 06/21/2016   Multifocal pneumonia 06/20/2016   FUO (fever of unknown origin) 04/15/2015   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 [place], [place], [address] Phone: [phone]   Fax:  [phone]   Name: William Kirk MRN: [id number] Date of Birth: [birth year]  [facility] [address] [place], [place], [address] Phone: [phone]   Fax:  [phone]  Speech Language Pathology Treatment  Patient Details  Name: William Kirk MRN: [id number] Date of Birth: 1940-04-11 Referring Provider (SLP): Dr. Patrina Levering   Encounter Date: 08/28/2020   End of Session - 08/28/20 1320     Visit Number 11    Number of Visits 17    Date for SLP Re-Evaluation 09/23/20    SLP Start Time 1021    SLP Stop Time  1102    SLP Time Calculation (min) 41 min    Activity Tolerance Patient tolerated treatment well             Past Medical History:  Diagnosis Date   Anxiety    Arthritis    "hands" (09/21/2016)   Bleeding stomach ulcer 2000s   COPD (chronic obstructive pulmonary disease) ([place])    Cough    Depression    Dyspnea    Hypertension    Pneumonia    "now and several times before this" (09/21/2016)   PVD (peripheral vascular disease) ([place]) 2014   prev PTCA on RLE    Past Surgical History:  Procedure Laterality Date   IR ANGIOGRAM SELECTIVE EACH ADDITIONAL VESSEL  07/26/2018   IR ANGIOGRAM SELECTIVE EACH ADDITIONAL VESSEL  07/26/2018   IR ANGIOGRAM SELECTIVE EACH ADDITIONAL VESSEL  07/26/2018   IR EMBO ART  VEN HEMORR LYMPH EXTRAV  INC GUIDE ROADMAPPING  07/26/2018   IR GASTROSTOMY TUBE MOD SED  12/14/2018   IR US GUIDE VASC ACCESS RIGHT  07/26/2018   JOINT REPLACEMENT     KNEE ARTHROSCOPY Right X 1   TOTAL HIP ARTHROPLASTY Right 2009   VIDEO ASSISTED THORACOSCOPY (VATS)/ LOBECTOMY Right 10/18/2018   Procedure: VIDEO ASSISTED THORACOSCOPY (VATS)/ right lower LOBECTOMY;  Surgeon: Melrose Nakayama, MD;  Location: [facility];  Service: Thoracic;  Laterality: Right;   VIDEO ASSISTED THORACOSCOPY (VATS)/DECORTICATION Right 11/09/2018   Procedure: REDO VIDEO ASSISTED THORACOSCOPY (VATS)/DECORTICATION/DRAIN EFFUSION;  Surgeon: Melrose Nakayama, MD;  Location: [facility];  Service: Thoracic;   Laterality: Right;   VIDEO BRONCHOSCOPY N/A 07/27/2018   Procedure: VIDEO BRONCHOSCOPY WITHOUT FLUORO;  Surgeon: Collene Gobble, MD;  Location: [facility];  Service: Cardiopulmonary;  Laterality: N/A;   VIDEO BRONCHOSCOPY N/A 11/09/2018   Procedure: VIDEO BRONCHOSCOPY;  Surgeon: Melrose Nakayama, MD;  Location: [facility] OR;  Service: Thoracic;  Laterality: N/A;    There were no vitals filed for this visit.   Subjective Assessment - 08/28/20 1316     Subjective EMST brought today.    Patient is accompained by: Family member   jan-wife                  ADULT SLP TREATMENT - 08/28/20 1316       General Information   Behavior/Cognition Alert;Cooperative;Pleasant mood;Requires cueing      Treatment Provided   Treatment provided Dysphagia      Dysphagia Treatment   Treatment Methods Therapeutic exercise;Skilled observation;Patient/caregiver education    Other treatment/comments EMST today pt req'd usual min-mod cues for a quick powerful blow instead of a long drawn out blow; SLP gave example and pt followed correctly with his productions - wfe cued appropriately after that PRN.  Pt req'd min-mod cues from wife, occasionally. [name] provided appropriate, effective cues at appropriate times for pt. SLP rovided blue EMST 150 for pt and SLP set to 30 dm H2O, and explained to wife

## 2020-09-04 ENCOUNTER — Ambulatory Visit: Payer: No Typology Code available for payment source

## 2020-09-04 ENCOUNTER — Other Ambulatory Visit: Payer: Self-pay

## 2020-09-04 DIAGNOSIS — R1314 Dysphagia, pharyngoesophageal phase: Secondary | ICD-10-CM

## 2020-09-04 NOTE — Therapy (Signed)
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   Hemoptysis 07/07/2018   Acute renal failure (ARF) ([place]) 07/07/2018   Acute respiratory failure ([place]) 07/07/2018   Chronic obstructive pulmonary disease ([place]) 11/07/2016   Chronic respiratory failure with hypoxia ([place]) 11/07/2016   Pulmonary infiltrates 11/07/2016   Swallowing dysfunction 09/26/2016   Depression 09/21/2016   Aspiration pneumonia ([place]) 09/21/2016   HTN (hypertension) 09/21/2016   HCAP (healthcare-associated pneumonia)    COPD with acute exacerbation ([place]) 06/21/2016   Acute respiratory failure with hypoxia ([place]) 06/21/2016   Hypokalemia 06/21/2016   Hyponatremia 06/21/2016   Thrombocytosis 06/21/2016   PVD (peripheral vascular disease) ([place]) 06/21/2016   Bronchiectasis ([place]) 06/21/2016   Protein-calorie malnutrition, severe ([place]) 06/21/2016   Multifocal pneumonia 06/20/2016   FUO (fever of unknown origin) 04/15/2015   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 ,[place], [place]  09/04/2020, 1:13 PM  [place] [facility] [address] [place],  [place], [address] Phone: [phone]   Fax:  [phone]   Name: William Kirk MRN: [id number] Date of Birth: Jun 22, 1940  [facility] [address] [place], [place], [address] Phone: [phone]   Fax:  [phone]  Speech Language Pathology Treatment  Patient Details  Name: William Kirk MRN: [id number] Date of Birth: 08-May-1940 Referring Provider (SLP): Dr. Patrina Levering   Encounter Date: 09/04/2020   End of Session - 09/04/20 1310     Visit Number 12    Number of Visits 17    Date for SLP Re-Evaluation 09/23/20    SLP Start Time 1020    SLP Stop Time  1100    SLP Time Calculation (min) 40 min    Activity Tolerance Patient tolerated treatment well             Past Medical History:  Diagnosis Date   Anxiety    Arthritis    "hands" (09/21/2016)   Bleeding stomach ulcer 2000s   COPD (chronic obstructive pulmonary disease) ([place])    Cough    Depression    Dyspnea    Hypertension    Pneumonia    "now and several times before this" (09/21/2016)   PVD (peripheral vascular disease) ([place]) 2014   prev PTCA on RLE    Past Surgical History:  Procedure Laterality Date   IR ANGIOGRAM SELECTIVE EACH ADDITIONAL VESSEL  07/26/2018   IR ANGIOGRAM SELECTIVE EACH ADDITIONAL VESSEL  07/26/2018   IR ANGIOGRAM SELECTIVE EACH ADDITIONAL VESSEL  07/26/2018   IR EMBO ART  VEN HEMORR LYMPH EXTRAV  INC GUIDE ROADMAPPING  07/26/2018   IR GASTROSTOMY TUBE MOD SED  12/14/2018   IR US GUIDE VASC ACCESS RIGHT  07/26/2018   JOINT REPLACEMENT     KNEE ARTHROSCOPY Right X 1   TOTAL HIP ARTHROPLASTY Right 2009   VIDEO ASSISTED THORACOSCOPY (VATS)/ LOBECTOMY Right 10/18/2018   Procedure: VIDEO ASSISTED THORACOSCOPY (VATS)/ right lower LOBECTOMY;  Surgeon: Melrose Nakayama, MD;  Location: [facility];  Service: Thoracic;  Laterality: Right;   VIDEO ASSISTED THORACOSCOPY (VATS)/DECORTICATION Right 11/09/2018   Procedure: REDO VIDEO ASSISTED THORACOSCOPY (VATS)/DECORTICATION/DRAIN EFFUSION;  Surgeon: Melrose Nakayama, MD;  Location: [facility] [place];  Service: Thoracic;   Laterality: Right;   VIDEO BRONCHOSCOPY N/A 07/27/2018   Procedure: VIDEO BRONCHOSCOPY WITHOUT FLUORO;  Surgeon: Collene Gobble, MD;  Location: [place];  Service: Cardiopulmonary;  Laterality: N/A;   VIDEO BRONCHOSCOPY N/A 11/09/2018   Procedure: VIDEO BRONCHOSCOPY;  Surgeon: Melrose Nakayama, MD;  Location: [facility] OR;  Service: Thoracic;  Laterality: N/A;    There were no vitals filed for this visit.   Subjective Assessment - 09/04/20 1024     Subjective EMST brought today. Pt having some success with blue EMST and sometimes it is more difficult, per [name].    Patient is accompained by: Family member   [name] -wife   Currently in Pain? Yes    Pain Location Leg    Pain Orientation Right    Pain Descriptors / Indicators Sore                   ADULT SLP TREATMENT - 09/04/20 1027       General Information   Behavior/Cognition Alert;Cooperative;Pleasant mood;Requires cueing      Treatment Provided   Treatment provided Dysphagia      Dysphagia Treatment   Other treatment/comments EMST today pt req'd occasional min-mod cues for a quick powerful blow instead of a long drawn out blow, faded cues to rare min-mod cues. [name] had to consistently stop pt at 5 blows as pt wanted to  [facility] [address] [place], [place], [address] Phone: [phone]   Fax:  [phone]  Speech Language Pathology Treatment  Patient Details  Name: William Kirk MRN: [id number] Date of Birth: 08-May-1940 Referring Provider (SLP): Dr. Patrina Levering   Encounter Date: 09/04/2020   End of Session - 09/04/20 1310     Visit Number 12    Number of Visits 17    Date for SLP Re-Evaluation 09/23/20    SLP Start Time 1020    SLP Stop Time  1100    SLP Time Calculation (min) 40 min    Activity Tolerance Patient tolerated treatment well             Past Medical History:  Diagnosis Date   Anxiety    Arthritis    "hands" (09/21/2016)   Bleeding stomach ulcer 2000s   COPD (chronic obstructive pulmonary disease) ([place])    Cough    Depression    Dyspnea    Hypertension    Pneumonia    "now and several times before this" (09/21/2016)   PVD (peripheral vascular disease) ([place]) 2014   prev PTCA on RLE    Past Surgical History:  Procedure Laterality Date   IR ANGIOGRAM SELECTIVE EACH ADDITIONAL VESSEL  07/26/2018   IR ANGIOGRAM SELECTIVE EACH ADDITIONAL VESSEL  07/26/2018   IR ANGIOGRAM SELECTIVE EACH ADDITIONAL VESSEL  07/26/2018   IR EMBO ART  VEN HEMORR LYMPH EXTRAV  INC GUIDE ROADMAPPING  07/26/2018   IR GASTROSTOMY TUBE MOD SED  12/14/2018   IR US GUIDE VASC ACCESS RIGHT  07/26/2018   JOINT REPLACEMENT     KNEE ARTHROSCOPY Right X 1   TOTAL HIP ARTHROPLASTY Right 2009   VIDEO ASSISTED THORACOSCOPY (VATS)/ LOBECTOMY Right 10/18/2018   Procedure: VIDEO ASSISTED THORACOSCOPY (VATS)/ right lower LOBECTOMY;  Surgeon: Melrose Nakayama, MD;  Location: [place] [facility];  Service: Thoracic;  Laterality: Right;   VIDEO ASSISTED THORACOSCOPY (VATS)/DECORTICATION Right 11/09/2018   Procedure: REDO VIDEO ASSISTED THORACOSCOPY (VATS)/DECORTICATION/DRAIN EFFUSION;  Surgeon: Melrose Nakayama, MD;  Location: [place];  Service: Thoracic;   Laterality: Right;   VIDEO BRONCHOSCOPY N/A 07/27/2018   Procedure: VIDEO BRONCHOSCOPY WITHOUT FLUORO;  Surgeon: Collene Gobble, MD;  Location: [place];  Service: Cardiopulmonary;  Laterality: N/A;   VIDEO BRONCHOSCOPY N/A 11/09/2018   Procedure: VIDEO BRONCHOSCOPY;  Surgeon: Melrose Nakayama, MD;  Location: [facility] OR;  Service: Thoracic;  Laterality: N/A;    There were no vitals filed for this visit.   Subjective Assessment - 09/04/20 1024     Subjective EMST brought today. Pt having some success with blue EMST and sometimes it is more difficult, per [name].    Patient is accompained by: Family member   [name] -wife   Currently in Pain? Yes    Pain Location Leg    Pain Orientation Right    Pain Descriptors / Indicators Sore                   ADULT SLP TREATMENT - 09/04/20 1027       General Information   Behavior/Cognition Alert;Cooperative;Pleasant mood;Requires cueing      Treatment Provided   Treatment provided Dysphagia      Dysphagia Treatment   Other treatment/comments EMST today pt req'd occasional min-mod cues for a quick powerful blow instead of a long drawn out blow, faded cues to rare min-mod cues. [name] had to consistently stop pt at 5 blows as pt wanted to

## 2020-09-04 NOTE — Patient Instructions (Signed)
We will meet after your test in about four weeks. We will meet once more after that, if needed, and then see if more visits are needed.

## 2020-10-05 ENCOUNTER — Ambulatory Visit: Payer: No Typology Code available for payment source

## 2020-10-06 ENCOUNTER — Ambulatory Visit (INDEPENDENT_AMBULATORY_CARE_PROVIDER_SITE_OTHER): Payer: No Typology Code available for payment source | Admitting: Pulmonary Disease

## 2020-10-06 ENCOUNTER — Other Ambulatory Visit: Payer: Self-pay

## 2020-10-06 ENCOUNTER — Encounter: Payer: Self-pay | Admitting: Pulmonary Disease

## 2020-10-06 VITALS — BP 130/60 | HR 56 | Temp 97.6°F | Ht 70.0 in | Wt 170.2 lb

## 2020-10-06 DIAGNOSIS — R0602 Shortness of breath: Secondary | ICD-10-CM

## 2020-10-06 DIAGNOSIS — J449 Chronic obstructive pulmonary disease, unspecified: Secondary | ICD-10-CM | POA: Diagnosis not present

## 2020-10-06 NOTE — Patient Instructions (Signed)
I have reviewed your records from the [place] in detail I am glad you are doing well with the current therapy Will continue the Spiriva and Wixela Continue to use the percussion vest and flutter valve and Mucinex for clearance of mucus We will schedule PFTs and follow-up in clinic in 3 months.

## 2020-10-06 NOTE — Progress Notes (Signed)
William Kirk    [id number]    October 23, 1940  Primary Care Physician:Bhuvaneswaran, Myrtha Mantis  Referring Physician: [facility] [address] [place],  [place] [address]  Chief complaint: Follow-up for COPD  HPI: 80 year old with history of COPD, chronic bronchiectasis, recurrent infections, chronic aspiration, hemoptysis status post right lower lobe ectomy in September 2020 complicated by prolonged infection, empyema, chest tube placement at [facility] Previously followed at the [facility] and is here to establish care as his pulmonologist is leaving the [place] system  Maintained on Spiriva and Wixela.  He has ProAir rescue inhaler and nebs which he uses occasionally.  He is adhering to mucociliary clearance with percussion vest, flutter valve and Mucinex Complains of chronic cough with white mucus, chest congestion.  Denies any fevers, chills, dyspnea  He had recurrent infections in the past requiring antibiotics.  Bronchoscopy was performed in November 2018.  Since 2020 after surgery is more stable with no significant exacerbations or hospitalizations.  He also has history of chronic aspiration and is being followed by speech therapy at the [place]  Pets: Dogs Occupation: Works in [employer].  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  No ongoing exposures at home.  No feather pillows or comforter Smoking history: 30-pack-year smoker.  Quit in 2015 Travel history: No significant travel history Relevant family history: No family history of lung disease   Outpatient Encounter Medications as of 10/06/2020  Medication Sig   acetaminophen (TYLENOL) 500 MG tablet TAKE ONE TABLET BY MOUTH THREE TIMES A DAY FOR CHRONIC PAIN   albuterol (VENTOLIN HFA) 108 (90 Base) MCG/ACT inhaler Inhale 2 puffs into the lungs every 4 (four) hours as needed for wheezing or shortness of breath.   aspirin EC 81 MG tablet  Take 81 mg by mouth daily. Swallow whole.   atorvastatin (LIPITOR) 20 MG tablet Place 1 tablet (20 mg total) into feeding tube at bedtime. (Patient taking differently: Place 20 mg into feeding tube at bedtime. TAKE ALL WITH  APPLESAUCE)   diclofenac sodium (VOLTAREN) 1 % GEL Apply 2 g topically 4 (four) times daily as needed (pain).    fluticasone-salmeterol (WIXELA INHUB) 250-50 MCG/ACT AEPB Inhale into the lungs every morning.   gabapentin (NEURONTIN) 800 MG tablet Take 1 tablet (800 mg total) by mouth 3 (three) times daily.   levalbuterol (XOPENEX) 0.63 MG/3ML nebulizer solution Take 3 mLs (0.63 mg total) by nebulization every 4 (four) hours as needed for wheezing or shortness of breath.   methocarbamol (ROBAXIN) 500 MG tablet Take 1 tablet (500 mg total) by mouth 2 (two) times daily as needed for muscle spasms.   metoprolol tartrate (LOPRESSOR) 25 MG tablet Place 12.5 mg into feeding tube daily. CRUSH MED   sertraline (ZOLOFT) 100 MG tablet Place 2 tablets (200 mg total) into feeding tube daily.   Tiotropium Bromide Monohydrate 2.5 MCG/ACT AERS Inhale 2 puffs into the lungs daily.   traZODone (DESYREL) 50 MG tablet Place 1 tablet (50 mg total) into feeding tube at bedtime.   budesonide-formoterol (SYMBICORT) 160-4.5 MCG/ACT inhaler Inhale 2 puffs into the lungs 2 (two) times daily. (Patient not taking: Reported on 10/06/2020)   lidocaine (LIDODERM) 5 % Place 1 patch onto the skin daily as needed (pain).    [DISCONTINUED] folic acid (FOLVITE) 1 MG tablet Place 1 tablet (1 mg total) into feeding tube daily. (Patient not taking: Reported on  10/06/2020)   No facility-administered encounter medications on file as of 10/06/2020.    Allergies as of 10/06/2020 - Review Complete 10/06/2020  Allergen Reaction Noted   Testosterone Rash 12/09/2003    Past Medical History:  Diagnosis Date   Anxiety    Arthritis    "hands" (09/21/2016)   Bleeding stomach ulcer 2000s   COPD (chronic obstructive  pulmonary disease) (HCC)    Cough    Depression    Dyspnea    Hypertension    Pneumonia    "now and several times before this" (09/21/2016)   PVD (peripheral vascular disease) (HCC) 2014   prev PTCA on RLE    Past Surgical History:  Procedure Laterality Date   IR ANGIOGRAM SELECTIVE EACH ADDITIONAL VESSEL  07/26/2018   IR ANGIOGRAM SELECTIVE EACH ADDITIONAL VESSEL  07/26/2018   IR ANGIOGRAM SELECTIVE EACH ADDITIONAL VESSEL  07/26/2018   IR EMBO ART  VEN HEMORR LYMPH EXTRAV  INC GUIDE ROADMAPPING  07/26/2018   IR GASTROSTOMY TUBE MOD SED  12/14/2018   IR US GUIDE VASC ACCESS RIGHT  07/26/2018   JOINT REPLACEMENT     KNEE ARTHROSCOPY Right X 1   TOTAL HIP ARTHROPLASTY Right 2009   VIDEO ASSISTED THORACOSCOPY (VATS)/ LOBECTOMY Right 10/18/2018   Procedure: VIDEO ASSISTED THORACOSCOPY (VATS)/ right lower LOBECTOMY;  Surgeon: Loreli Slot, MD;  Location: [facility] OR;  Service: Thoracic;  Laterality: Right;   VIDEO ASSISTED THORACOSCOPY (VATS)/DECORTICATION Right 11/09/2018   Procedure: REDO VIDEO ASSISTED THORACOSCOPY (VATS)/DECORTICATION/DRAIN EFFUSION;  Surgeon: Loreli Slot, MD;  Location: [facility] OR;  Service: Thoracic;  Laterality: Right;   VIDEO BRONCHOSCOPY N/A 07/27/2018   Procedure: VIDEO BRONCHOSCOPY WITHOUT FLUORO;  Surgeon: Leslye Peer, MD;  Location: [facility] OR;  Service: Cardiopulmonary;  Laterality: N/A;   VIDEO BRONCHOSCOPY N/A 11/09/2018   Procedure: VIDEO BRONCHOSCOPY;  Surgeon: Loreli Slot, MD;  Location: [facility] OR;  Service: Thoracic;  Laterality: N/A;    Family History  Problem Relation Age of Onset   Cancer Father 38       Brain   Dementia Mother     Social History   Socioeconomic History   Marital status: Married    Spouse name: Not on file   Number of children: Not on file   Years of education: 16   Highest education level: Not on file  Occupational History   Occupation: Patent examiner  Tobacco Use   Smoking status: Former    Packs/day: 1.00     Years: 55.00    Pack years: 55.00    Types: Cigarettes    Quit date: 04/2015    Years since quitting: 5.5   Smokeless tobacco: Never  Vaping Use   Vaping Use: Never used  Substance and Sexual Activity   Alcohol use: No    Alcohol/week: 0.0 standard drinks   Drug use: No   Sexual activity: Not Currently    Partners: Female  Other Topics Concern   Not on file  Social History Narrative   Not on file   Social Determinants of Health   Financial Resource Strain: Not on file  Food Insecurity: Not on file  Transportation Needs: Not on file  Physical Activity: Not on file  Stress: Not on file  Social Connections: Not on file  Intimate Partner Violence: Not on file    Review of systems: Review of Systems  Constitutional: Negative for fever and chills.  HENT: Negative.   Eyes: Negative for blurred vision.  Respiratory: as per HPI  Cardiovascular: Negative for chest pain and palpitations.  Gastrointestinal: Negative for vomiting, diarrhea, blood per rectum. Genitourinary: Negative for dysuria, urgency, frequency and hematuria.  Musculoskeletal: Negative for myalgias, back pain and joint pain.  Skin: Negative for itching and rash.  Neurological: Negative for dizziness, tremors, focal weakness, seizures and loss of consciousness.  Endo/Heme/Allergies: Negative for environmental allergies.  Psychiatric/Behavioral: Negative for depression, suicidal ideas and hallucinations.  All other systems reviewed and are negative.  Physical Exam: Blood pressure 130/60, pulse (!) 56, temperature 97.6 F (36.4 C), temperature source Oral, height 5\' 10"  (1.778 m), weight 170 lb 3.2 oz (77.2 kg), SpO2 95 %. Gen:      No acute distress HEENT:  EOMI, sclera anicteric Neck:     No masses; no thyromegaly Lungs:    Scattered bibasilar crackles CV:         Regular rate and rhythm; no murmurs Abd:      + bowel sounds; soft, non-tender; no palpable masses, no distension Ext:    No edema; adequate  peripheral perfusion Skin:      Warm and dry; no rash Neuro: alert and oriented x 3 Psych: normal mood and affect  Data Reviewed: Imaging: CT report from the [place] dated 09/02/2020-postsurgical changes of right lower lobectomy, secretions and debris noted in the bronchus intermedius, bronchial stump, mild emphysema, irregular stable opacities in the right lung, bronchiectasis, atelectasis  PFTs:  Labs:  Data reviewed from the [place] PFTs May 2017-FEV1 2.38 [76%), ratio 58, no restriction, DLCO 113% Bronchoscopy November 2018-BAL growing strep angina gnosis Serum IgG 2017 normal level 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  [facility] His CT report from July 2022 shows stable changes and stable interstitial opacities, nodules.  Will get images from his recent CT for personal review.  He will need repeat CT in 1 year Recheck pulmonary function test  His inhaler regimen is optimized and will continue Spiriva and Wixela Encouraged to continue mucociliary clearance with percussion devices and Mucinex.  Plan/Recommendations: Continue inhalers Continue percussion vest, flutter valve and Mucinex PFTs and follow-up in 3 months Obtain images from the [place]  Chilton Greathouse MD [facility] 10/06/2020, 9:30 AM  CC: [facility]

## 2020-10-15 ENCOUNTER — Telehealth: Payer: Self-pay

## 2020-10-15 NOTE — Telephone Encounter (Signed)
SLP phoned pt's referring PA Posey Pronto) at [facility], at [phone], to request an order for pt's follow up modified (MBSS). While on the phone with the PA, she stated she made the referral and noted the fax number for [facility] [phone] to pass to [facility] which will fax the referral to [place] so the patient can get scheduled for a follow up MBSS.  Garald Balding, ,MS, CCC-SLP

## 2020-11-06 ENCOUNTER — Ambulatory Visit: Payer: No Typology Code available for payment source

## 2020-11-09 ENCOUNTER — Ambulatory Visit: Payer: No Typology Code available for payment source | Attending: Internal Medicine

## 2020-11-09 ENCOUNTER — Other Ambulatory Visit (HOSPITAL_BASED_OUTPATIENT_CLINIC_OR_DEPARTMENT_OTHER): Payer: Self-pay

## 2020-11-09 DIAGNOSIS — Z23 Encounter for immunization: Secondary | ICD-10-CM

## 2020-11-09 MED ORDER — INFLUENZA VAC A&B SA ADJ QUAD 0.5 ML IM PRSY
PREFILLED_SYRINGE | INTRAMUSCULAR | 0 refills | Status: DC
  Filled 2020-11-09: qty 0.5, 1d supply, fill #0

## 2020-11-09 NOTE — Progress Notes (Signed)
Covid-19 Vaccination Clinic  Name:  William Kirk    MRN: [id number] DOB: 12-25-1940  11/09/2020  [name] was observed post Covid-19 immunization for 15 minutes without incident. He was provided with Vaccine Information Sheet and instruction to access the V-Safe system.   [name] was instructed to call 911 with any severe reactions post vaccine: Difficulty breathing  Swelling of face and throat  A fast heartbeat  A bad rash all over body  Dizziness and weakness

## 2020-11-10 ENCOUNTER — Encounter
Payer: No Typology Code available for payment source | Attending: Physical Medicine and Rehabilitation | Admitting: Physical Medicine and Rehabilitation

## 2020-11-10 ENCOUNTER — Other Ambulatory Visit: Payer: Self-pay

## 2020-11-10 VITALS — BP 152/70 | HR 66 | Temp 98.4°F | Ht 70.0 in | Wt 169.2 lb

## 2020-11-10 DIAGNOSIS — I10 Essential (primary) hypertension: Secondary | ICD-10-CM | POA: Insufficient documentation

## 2020-11-10 DIAGNOSIS — I739 Peripheral vascular disease, unspecified: Secondary | ICD-10-CM | POA: Insufficient documentation

## 2020-11-10 NOTE — Progress Notes (Signed)
Subjective:    Patient ID: William Kirk, male    DOB: October 23, 1940, 80 y.o.   MRN: [id number]  HPI Male with pmh/psh of PVD, HTN, depression, COPD, gastric ulcer, OA, anxiety, VATS, right THA, right meniscal tear repair presents for follow-up of right leg > back pain. Referred to me by Dr. Allena Katz.  Initially stated: Wife provides history. CTS notes reviewed for VATS.  Pain started 12/2019. Stable. Denies inciting event.  He received a cream (helped), OTC lidocaine patch, heat helps.  Ambulation exacerbates the pain, for which he sits for a little while and is able to ambulate again.  Pin/needles in legs.  Radiates to calfs/feet. Intermittent.  Denies falls. Denies associated weakness/numbness.  Pain limits ambulation. He follows with vascular. He notes easier to go down stairs.   Wife supplements history. Since last visit, he notes improvement with Gabapentin increase. Denies falls. Back pain has improved.   He has been mowing his his lawn, works in the yard, he is not doing any formal physical therapy. He does pushups every day. He also walks. Discussed Qutenza and he and his wife are interested. Reviewed vascular surgery note with them.   He is ready to try to Qutenza today He has pain in bilateral calves, left worse than right He has follow-up with vascular surgery  He is also following with podiatry for bone spurs that have been causing heel pain, especially in right foot Also has low back pain  Pain Inventory Average Pain 8 Pain Right Now 9 My pain is constant, burning, tingling, and aching  In the last 24 hours, has pain interfered with the following? General activity 6 Relation with others 0 Enjoyment of life 5 What TIME of day is your pain at its worst? daytime Sleep (in general) Fair  Pain is worse with: walking, bending, standing, and some activites Pain improves with: rest, heat/ice, and medication Relief from Meds: 5    Family History  Problem Relation Age of Onset    Cancer Father 81       Brain   Dementia Mother    Social History   Socioeconomic History   Marital status: Married    Spouse name: Not on file   Number of children: Not on file   Years of education: 16   Highest education level: Not on file  Occupational History   Occupation: Patent examiner  Tobacco Use   Smoking status: Former    Packs/day: 1.00    Years: 55.00    Pack years: 55.00    Types: Cigarettes    Quit date: 04/2015    Years since quitting: 5.5   Smokeless tobacco: Never  Vaping Use   Vaping Use: Never used  Substance and Sexual Activity   Alcohol use: No    Alcohol/week: 0.0 standard drinks   Drug use: No   Sexual activity: Not Currently    Partners: Female  Other Topics Concern   Not on file  Social History Narrative   Not on file   Social Determinants of Health   Financial Resource Strain: Not on file  Food Insecurity: Not on file  Transportation Needs: Not on file  Physical Activity: Not on file  Stress: Not on file  Social Connections: Not on file   Past Surgical History:  Procedure Laterality Date   IR ANGIOGRAM SELECTIVE EACH ADDITIONAL VESSEL  07/26/2018   IR ANGIOGRAM SELECTIVE EACH ADDITIONAL VESSEL  07/26/2018   IR ANGIOGRAM SELECTIVE EACH ADDITIONAL VESSEL  07/26/2018  IR EMBO ART  VEN HEMORR LYMPH EXTRAV  INC GUIDE ROADMAPPING  07/26/2018   IR GASTROSTOMY TUBE MOD SED  12/14/2018   IR US GUIDE VASC ACCESS RIGHT  07/26/2018   JOINT REPLACEMENT     KNEE ARTHROSCOPY Right X 1   TOTAL HIP ARTHROPLASTY Right 2009   VIDEO ASSISTED THORACOSCOPY (VATS)/ LOBECTOMY Right 10/18/2018   Procedure: VIDEO ASSISTED THORACOSCOPY (VATS)/ right lower LOBECTOMY;  Surgeon: Loreli Slot, MD;  Location: [facility] OR;  Service: Thoracic;  Laterality: Right;   VIDEO ASSISTED THORACOSCOPY (VATS)/DECORTICATION Right 11/09/2018   Procedure: REDO VIDEO ASSISTED THORACOSCOPY (VATS)/DECORTICATION/DRAIN EFFUSION;  Surgeon: Loreli Slot, MD;  Location: [facility] OR;   Service: Thoracic;  Laterality: Right;   VIDEO BRONCHOSCOPY N/A 07/27/2018   Procedure: VIDEO BRONCHOSCOPY WITHOUT FLUORO;  Surgeon: Leslye Peer, MD;  Location: [facility] OR;  Service: Cardiopulmonary;  Laterality: N/A;   VIDEO BRONCHOSCOPY N/A 11/09/2018   Procedure: VIDEO BRONCHOSCOPY;  Surgeon: Loreli Slot, MD;  Location: [facility] OR;  Service: Thoracic;  Laterality: N/A;   Past Medical History:  Diagnosis Date   Anxiety    Arthritis    "hands" (09/21/2016)   Bleeding stomach ulcer 2000s   COPD (chronic obstructive pulmonary disease) (HCC)    Cough    Depression    Dyspnea    Hypertension    Pneumonia    "now and several times before this" (09/21/2016)   PVD (peripheral vascular disease) (HCC) 2014   prev PTCA on RLE   There were no vitals taken for this visit.  Opioid Risk Score:   Fall Risk Score:  `1  Depression screen PHQ 2/9  Depression screen [facility] 2/9 03/17/2020 04/15/2015  Decreased Interest 1 1  Down, Depressed, Hopeless 0 0  PHQ - 2 Score 1 1  Altered sleeping 0 -  Tired, decreased energy 2 -  Change in appetite 0 -  Feeling bad or failure about yourself  0 -  Trouble concentrating 0 -  Moving slowly or fidgety/restless 0 -  Suicidal thoughts 0 -  PHQ-9 Score 3 -   Review of Systems  Constitutional: Negative.   HENT: Negative.    Eyes: Negative.   Respiratory: Negative.    Cardiovascular: Negative.   Gastrointestinal: Negative.   Endocrine: Negative.   Genitourinary: Negative.   Musculoskeletal:  Positive for back pain.       Calf pain  Skin: Negative.   Allergic/Immunologic: Negative.   All other systems reviewed and are negative.    Objective:   Physical Exam  Gen: no distress, normal appearing HEENT: oral mucosa pink and moist, NCAT Cardio: Reg rate Chest: normal effort, normal rate of breathing Abd: soft, non-distended Ext: no edema Psych: pleasant, normal affect Skin: intact Neuro: Gait: Kyphotic and slightly wide based, unchanged Neuro:  Alert Motor: 5/5 b/l LE Sensation intact to light touch B/l LE Vibration: Absent in foot, L>R previously Proprioception: Limited in LLE previously    Assessment & Plan:  Male with pmh of PVD, HTN, depression, COPD, gastric ulcer, OA, anxiety presents for f/u of back and leg pain.   1. Chronic right leg (predominantly in calf) > back pain - mutlifactorial: PVD +/- facet arthropathy +/- radiculopathy  MRI from 12/2019 revealing scoliosis and multilevel lumbar spondylosis. L2-3 foraminal stenosis, L3-4 facet arthropathy, foraminal narrowing,L4-5 foraminal narrowing. L5-S1 foraminal narrowing, all bilaterally.   Labs reviewed from 2020, encouraged to bring in recent labs  Will avoid NSAIDs due to comorbidities  Will consider PT with trial TENS  after Vascular appointment  Will consider Bracing  Continue Voltaren gel  Continue Gabapentin to 600 TID  Will consider Cymbalta  Continue Robaxin 500 BID PRN, avoid taking together with Gabapentin  Patient states main goal is to ambulate  Will consider ESIs  Continue heat.   Vascular notes reviewed.   Ordered CMP, Vit B, Vit D, Mag, awaiting, discussed with family.   --Discussed Qutenza as an option for neuropathic pain control. Discussed that this is a capsaicin patch, stronger than capsaicin cream. Discussed that it is currently approved for diabetic peripheral neuropathy and post-herpetic neuralgia, but that it has also shown benefit in treating other forms of neuropathy. Provided patient with link to site to learn more about the patch: https://www.clark.biz/. Discussed that the patch would be placed in office and benefits usually last 3 months. Discussed that unintended exposure to capsaicin can cause severe irritation of eyes, mucous membranes, respiratory tract, and skin, but that Qutenza is a local treatment and does not have the systemic side effects of other nerve medications. Discussed that there may be pain, itching, erythema, and decreased  sensory function associated with the application of Qutenza. Side effects usually subside within 1 week. A cold pack of analgesic medications can help with these side effects. Blood pressure can also be increased due to pain associated with administration of the patch.  1 patch of Qutenza was applied to the area of pain. Ice packs were applied during the procedure to ensure patient comfort. Blood pressure was monitored every 15 minutes. The patient tolerated the procedure well. Post-procedure instructions were given and follow-up has been scheduled.   NDC [phone]   2. Gait abnormality  Continue cane prn  3. HTN: -BP is 152/70 today.  -Advised regarding healthy foods that can help lower blood pressure and provided with a list: 1) citrus foods- high in vitamins and minerals 2) salmon and other fatty fish - reduces inflammation and oxylipins 3) swiss chard (leafy green)- high level of nitrates 4) pumpkin seeds- one of the best natural sources of magnesium 5) Beans and lentils- high in fiber, magnesium, and potassium 6) Berries- high in flavonoids 7) Amaranth (whole grain, can be cooked similarly to rice and oats)- high in magnesium and fiber 8) Pistachios- even more effective at reducing BP than other nuts 9) Carrots- high in phenolic compounds that relax blood vessels and reduce inflammation 10) Celery- contain phthalides that relax tissues of arterial walls 11) Tomatoes- can also improve cholesterol and reduce risk of heart disease 12) Broccoli- good source of magnesium, calcium, and potassium 13) Greek yogurt: high in potassium and calcium 14) Herbs and spices: Celery seed, cilantro, saffron, lemongrass, black cumin, ginseng, cinnamon, cardamom, sweet basil, and ginger 15) Chia and flax seeds- also help to lower cholesterol and blood sugar 16) Beets- high levels of nitrates that relax blood vessels  17) spinach and bananas- high in potassium  -Provided lise of supplements that can  help with hypertension:  1) magnesium: one high quality brand is Bioptemizers since it contains all 7 types of magnesium, otherwise over the counter magnesium gluconate 400mg  is a good option 2) B vitamins 3) vitamin D 4) potassium 5) CoQ10 6) L-arginine 7) Vitamin C 8) Beetroot -Educated that goal BP is 120/80. -Made goal to incorporate some of the above foods into diet.

## 2020-11-17 ENCOUNTER — Other Ambulatory Visit (HOSPITAL_BASED_OUTPATIENT_CLINIC_OR_DEPARTMENT_OTHER): Payer: Self-pay

## 2020-11-17 MED ORDER — MODERNA COVID-19 BIVAL BOOSTER 50 MCG/0.5ML IM SUSP
INTRAMUSCULAR | 0 refills | Status: DC
  Filled 2020-11-17: qty 0.5, 1d supply, fill #0

## 2020-11-26 ENCOUNTER — Telehealth (HOSPITAL_COMMUNITY): Payer: Self-pay | Admitting: *Deleted

## 2020-11-26 NOTE — Telephone Encounter (Signed)
Talked with [name] about scheduling Jonnathan for OP MBS but I am deferring this matter to [place]. She is aware of [facility] with this patient. RKEEL.

## 2020-11-30 ENCOUNTER — Telehealth (HOSPITAL_COMMUNITY): Payer: Self-pay

## 2020-11-30 ENCOUNTER — Other Ambulatory Visit (HOSPITAL_COMMUNITY): Payer: Self-pay

## 2020-11-30 DIAGNOSIS — R1310 Dysphagia, unspecified: Secondary | ICD-10-CM

## 2020-11-30 NOTE — Telephone Encounter (Signed)
Called and spoke with wife ([name]) - I informed spouse that we do not have patients paperwork. She stated she will get that sent over to [place] as soon as possible. Will contact her once order has been received.

## 2020-12-15 ENCOUNTER — Encounter (HOSPITAL_COMMUNITY): Payer: No Typology Code available for payment source

## 2020-12-15 ENCOUNTER — Ambulatory Visit: Payer: No Typology Code available for payment source | Admitting: Vascular Surgery

## 2020-12-16 ENCOUNTER — Ambulatory Visit (HOSPITAL_COMMUNITY)
Admission: RE | Admit: 2020-12-16 | Discharge: 2020-12-16 | Disposition: A | Discharge status: Home / Self Care | Payer: No Typology Code available for payment source | Source: Ambulatory Visit | Attending: Neurology | Admitting: Neurology

## 2020-12-16 ENCOUNTER — Other Ambulatory Visit: Payer: Self-pay

## 2020-12-16 DIAGNOSIS — R1310 Dysphagia, unspecified: Secondary | ICD-10-CM | POA: Insufficient documentation

## 2020-12-21 NOTE — Progress Notes (Addendum)
[facility] [place]  ASSESSMENT / PLAN: [name] is a 80 y.o. 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  Patient counseled patients with asymptomatic peripheral arterial disease or claudication have a 1-2% risk of developing chronic limb threatening ischemia, but a 15-30% risk of mortality in the next 5 years. Intervention should only be considered for medically optimized patients with disabling symptoms. .  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 Now at threshold to consider prophylactic repair.  The patient should continue best medical therapy for atherosclerosis including: Complete cessation from all tobacco products. Blood glucose control with goal A1c < 7%. Blood pressure control with goal blood pressure < 140/90 mmHg. Lipid reduction therapy with goal LDL-C <100 mg/dL (<70 if symptomatic from PAD).  Aspirin 81mg  PO QD.  Atorvastatin 40-80mg  PO QD (or other "high intensity" statin therapy). Adequate hydration (at least 2 liters / day) if patient's heart and kidney function is adequate. Daily walking to and past the point of discomfort. Patient counseled to keep a log of exercise distance.  Will check CT angiogram. Given recent dysphagia and aspiration events, a TCAR would be best for him.   CHIEF COMPLAINT: intermittent claudication  HISTORY OF PRESENT ILLNESS: William Kirk is a 80 y.o. male referred to clinic for evaluation of left lower extremity intermittent claudication.  He is a [place] patient and has undergone ABI and carotid artery duplex with his care team at the [place].  He reports he can walk about a half block before typical calf cramping begins.  This improves with rest.  He specifically denies symptoms typical of ischemic rest pain.  He has no ulcerations about his foot.  He is able to function with his claudication.  He was able to mow part of his yard this past  weekend.  He has previously undergone percutaneous intervention in his right lower extremity but was not able to give much detail.  12/22/20: Patient returns to clinic for surveillance.  He reports his claudication symptoms are about the same.  He is able to walk, but is bothered somewhat by claudication.  He is not quite at a disabling level yet.  We reviewed his carotid duplex in detail today, and I counseled him extensively about its findings.  We reviewed the natural history of carotid artery stenosis, and the rationale for prophylactic, asymptomatic intervention.  Past Medical History:  Diagnosis Date   Anxiety    Arthritis    "hands" (09/21/2016)   Bleeding stomach ulcer 2000s   COPD (chronic obstructive pulmonary disease) (HCC)    Cough    Depression    Dyspnea    Hypertension    Pneumonia    "now and several times before this" (09/21/2016)   PVD (peripheral vascular disease) ([place]) 2014   prev PTCA on RLE    Past Surgical History:  Procedure Laterality Date   IR ANGIOGRAM SELECTIVE EACH ADDITIONAL VESSEL  07/26/2018   IR ANGIOGRAM SELECTIVE EACH ADDITIONAL VESSEL  07/26/2018   IR ANGIOGRAM SELECTIVE EACH ADDITIONAL VESSEL  07/26/2018   IR EMBO ART  VEN HEMORR LYMPH EXTRAV  INC GUIDE ROADMAPPING  07/26/2018   IR GASTROSTOMY TUBE MOD SED  12/14/2018   IR US GUIDE VASC ACCESS RIGHT  07/26/2018   JOINT REPLACEMENT     KNEE ARTHROSCOPY Right X 1   TOTAL HIP ARTHROPLASTY Right 2009   VIDEO ASSISTED THORACOSCOPY (VATS)/ LOBECTOMY Right 10/18/2018   Procedure: VIDEO  ASSISTED THORACOSCOPY (VATS)/ right lower LOBECTOMY;  Surgeon: Melrose Nakayama, MD;  Location: [place];  Service: Thoracic;  Laterality: Right;   VIDEO ASSISTED THORACOSCOPY (VATS)/DECORTICATION Right 11/09/2018   Procedure: REDO VIDEO ASSISTED THORACOSCOPY (VATS)/DECORTICATION/DRAIN EFFUSION;  Surgeon: Melrose Nakayama, MD;  Location: [place];  Service: Thoracic;  Laterality: Right;   VIDEO BRONCHOSCOPY N/A 07/27/2018    Procedure: VIDEO BRONCHOSCOPY WITHOUT FLUORO;  Surgeon: Collene Gobble, MD;  Location: [place];  Service: Cardiopulmonary;  Laterality: N/A;   VIDEO BRONCHOSCOPY N/A 11/09/2018   Procedure: VIDEO BRONCHOSCOPY;  Surgeon: Melrose Nakayama, MD;  Location: [facility] OR;  Service: Thoracic;  Laterality: N/A;    Family History  Problem Relation Age of Onset   Cancer Father 68       Brain   Dementia Mother     Social History   Socioeconomic History   Marital status: Married    Spouse name: Not on file   Number of children: Not on file   Years of education: 16   Highest education level: Not on file  Occupational History   Occupation: Event organiser  Tobacco Use   Smoking status: Former    Packs/day: 1.00    Years: 55.00    Pack years: 55.00    Types: Cigarettes    Quit date: 04/2015    Years since quitting: 5.7   Smokeless tobacco: Never  Vaping Use   Vaping Use: Never used  Substance and Sexual Activity   Alcohol use: No    Alcohol/week: 0.0 standard drinks   Drug use: No   Sexual activity: Not Currently    Partners: Female  Other Topics Concern   Not on file  Social History Narrative   Not on file   Social Determinants of Health   Financial Resource Strain: Not on file  Food Insecurity: Not on file  Transportation Needs: Not on file  Physical Activity: Not on file  Stress: Not on file  Social Connections: Not on file  Intimate Partner Violence: Not on file    Allergies  Allergen Reactions   Testosterone Rash    Current Outpatient Medications  Medication Sig Dispense Refill   acetaminophen (TYLENOL) 500 MG tablet TAKE ONE TABLET BY MOUTH THREE TIMES A DAY FOR CHRONIC PAIN     albuterol (VENTOLIN HFA) 108 (90 Base) MCG/ACT inhaler Inhale 2 puffs into the lungs every 4 (four) hours as needed for wheezing or shortness of breath.     aspirin EC 81 MG tablet Take 81 mg by mouth daily. Swallow whole.     atorvastatin (LIPITOR) 20 MG tablet Place 1 tablet (20 mg total)  into feeding tube at bedtime. (Patient taking differently: Place 20 mg into feeding tube at bedtime. TAKE ALL WITH  APPLESAUCE) 30 tablet 1   budesonide-formoterol (SYMBICORT) 160-4.5 MCG/ACT inhaler Inhale 2 puffs into the lungs 2 (two) times daily.     COVID-19 mRNA bivalent vaccine, Moderna, (MODERNA COVID-19 BIVAL BOOSTER) 50 MCG/0.5ML injection Inject into the muscle. 0.5 mL 0   diclofenac (VOLTAREN) 50 MG EC tablet Take 1 tablet by mouth 2 (two) times daily as needed.     diclofenac sodium (VOLTAREN) 1 % GEL Apply 2 g topically 4 (four) times daily as needed (pain).      fluticasone-salmeterol (ADVAIR) 250-50 MCG/ACT AEPB Inhale into the lungs every morning.     gabapentin (NEURONTIN) 800 MG tablet Take 1 tablet (800 mg total) by mouth 3 (three) times daily. 90 tablet 3   influenza  vaccine adjuvanted (FLUAD) 0.5 ML injection Inject into the muscle. 0.5 mL 0   levalbuterol (XOPENEX) 0.63 MG/3ML nebulizer solution Take 3 mLs (0.63 mg total) by nebulization every 4 (four) hours as needed for wheezing or shortness of breath. 3 mL 12   lidocaine (LIDODERM) 5 % Place 1 patch onto the skin daily as needed (pain).      methocarbamol (ROBAXIN) 500 MG tablet Take 1 tablet (500 mg total) by mouth 2 (two) times daily as needed for muscle spasms. 60 tablet 1   metoprolol tartrate (LOPRESSOR) 25 MG tablet Place 12.5 mg into feeding tube daily. CRUSH MED     sertraline (ZOLOFT) 100 MG tablet Place 2 tablets (200 mg total) into feeding tube daily. 30 tablet 0   Tiotropium Bromide Monohydrate 2.5 MCG/ACT AERS Inhale 2 puffs into the lungs daily.     traZODone (DESYREL) 50 MG tablet Place 1 tablet (50 mg total) into feeding tube at bedtime. 30 tablet 1   No current facility-administered medications for this visit.    REVIEW OF SYSTEMS:  [X]  denotes positive finding, [ ]  denotes negative finding Cardiac  Comments:  Chest pain or chest pressure:    Shortness of breath upon exertion:    Short of breath when  lying flat:    Irregular heart rhythm:        Vascular    Pain in calf, thigh, or hip brought on by ambulation:    Pain in feet at night that wakes you up from your sleep:     Blood clot in your veins:    Leg swelling:         Pulmonary    Oxygen at home:    Productive cough:     Wheezing:         Neurologic    Sudden weakness in arms or legs:     Sudden numbness in arms or legs:     Sudden onset of difficulty speaking or slurred speech:    Temporary loss of vision in one eye:     Problems with dizziness:         Gastrointestinal    Blood in stool:     Vomited blood:         Genitourinary    Burning when urinating:     Blood in urine:        Psychiatric    Major depression:         Hematologic    Bleeding problems:    Problems with blood clotting too easily:        Skin    Rashes or ulcers:        Constitutional    Fever or chills:      PHYSICAL EXAM  Vitals:   12/22/20 1338 12/22/20 1339  BP: (!) 146/78 (!) 157/76  Pulse: (!) 51   Resp: 20   Temp: 97.9 F (36.6 C)   SpO2: 94%   Weight: 169 lb (76.7 kg)   Height: 5\' 10"  (1.778 m)      Constitutional: well appearing. no distress. Appears well nourished.  Neurologic: CN intact. no focal findings. no sensory loss. Psychiatric: Mood and affect symmetric and appropriate. Eyes: No icterus. No conjunctival pallor. Ears, nose, throat: mucous membranes moist. Midline trachea.  Cardiac: regular rate and rhythm.  Respiratory: unlabored. Abdominal: soft, non-tender, non-distended.  Peripheral vascular:  No palpable pedal pulses.  Feet are warm. Extremity: No edema. No cyanosis. No pallor.  Skin: No gangrene. No ulceration.  Lymphatic: No Stemmer's sign. No palpable lymphadenopathy.  PERTINENT LABORATORY AND RADIOLOGIC DATA  Most recent CBC CBC Latest Ref Rng & Units 12/14/2018 11/20/2018 11/16/2018  WBC 4.0 - 10.5 K/uL 11.1(H) 10.4 9.0  Hemoglobin 13.0 - 17.0 g/dL 11.3(L) 11.5(L) 11.7(L)  Hematocrit  39.0 - 52.0 % 37.3(L) 36.2(L) 36.4(L)  Platelets 150 - 400 K/uL 301 350 381     Most recent CMP CMP Latest Ref Rng & Units 11/20/2018 11/16/2018 11/14/2018  Glucose 70 - 99 mg/dL 105(H) 92 99  BUN 8 - 23 mg/dL 31(H) 15 12  Creatinine 0.61 - 1.24 mg/dL 0.71 0.61 0.75  Sodium 135 - 145 mmol/L 137 139 140  Potassium 3.5 - 5.1 mmol/L 4.7 4.1 3.9  Chloride 98 - 111 mmol/L 102 101 105  CO2 22 - 32 mmol/L 28 29 29   Calcium 8.9 - 10.3 mg/dL 8.5(L) 8.3(L) 7.8(L)  Total Protein 6.5 - 8.1 g/dL 6.6 - -  Total Bilirubin 0.3 - 1.2 mg/dL 0.6 - -  Alkaline Phos 38 - 126 U/L 107 - -  AST 15 - 41 U/L 25 - -  ALT 0 - 44 U/L 23 - -   Vascular Imaging:  +-------+-----------+-----------+------------+------------+  ABI/TBIToday's ABIToday's TBIPrevious ABIPrevious TBI  +-------+-----------+-----------+------------+------------+  Right  0.76       0.46       0.63        0.44          +-------+-----------+-----------+------------+------------+  Left   0.58       0.33       0.33        0.28          +-------+-----------+-----------+------------+------------+  Carotid Duplex Right Carotid: Velocities in the right ICA are consistent with a 80-99%  stenosis                 in the bifurcation. Calcific plaque may obscure higher  velocity .                 The proximal ECA not visualized, wavefom dampened on  distally.   Left Carotid: Velocities in the left ICA are consistent with a 1-39%  stenosis.   Vertebrals:  Bilateral vertebral arteries demonstrate antegrade flow.  Subclavians: Normal flow hemodynamics were seen in bilateral subclavian               arteries.   Yevonne Aline. Stanford Breed, MD [facility] Phone Number: [phone] 12/21/2020 6:03 PM

## 2020-12-22 ENCOUNTER — Encounter: Payer: Self-pay | Admitting: Vascular Surgery

## 2020-12-22 ENCOUNTER — Ambulatory Visit (INDEPENDENT_AMBULATORY_CARE_PROVIDER_SITE_OTHER)
Admission: RE | Admit: 2020-12-22 | Discharge: 2020-12-22 | Disposition: A | Discharge status: Home / Self Care | Payer: No Typology Code available for payment source | Source: Ambulatory Visit | Attending: Vascular Surgery | Admitting: Vascular Surgery

## 2020-12-22 ENCOUNTER — Ambulatory Visit (HOSPITAL_COMMUNITY)
Admission: RE | Admit: 2020-12-22 | Discharge: 2020-12-22 | Disposition: A | Discharge status: Home / Self Care | Payer: No Typology Code available for payment source | Source: Ambulatory Visit | Attending: Vascular Surgery | Admitting: Vascular Surgery

## 2020-12-22 ENCOUNTER — Other Ambulatory Visit: Payer: Self-pay

## 2020-12-22 ENCOUNTER — Ambulatory Visit (INDEPENDENT_AMBULATORY_CARE_PROVIDER_SITE_OTHER): Payer: No Typology Code available for payment source | Admitting: Vascular Surgery

## 2020-12-22 VITALS — BP 157/76 | HR 51 | Temp 97.9°F | Resp 20 | Ht 70.0 in | Wt 169.0 lb

## 2020-12-22 DIAGNOSIS — I739 Peripheral vascular disease, unspecified: Secondary | ICD-10-CM | POA: Insufficient documentation

## 2020-12-22 DIAGNOSIS — I6523 Occlusion and stenosis of bilateral carotid arteries: Secondary | ICD-10-CM

## 2020-12-25 ENCOUNTER — Encounter: Payer: Self-pay | Admitting: Pulmonary Disease

## 2020-12-25 ENCOUNTER — Ambulatory Visit (INDEPENDENT_AMBULATORY_CARE_PROVIDER_SITE_OTHER): Payer: No Typology Code available for payment source | Admitting: Pulmonary Disease

## 2020-12-25 ENCOUNTER — Other Ambulatory Visit: Payer: Self-pay

## 2020-12-25 VITALS — BP 122/58 | HR 56 | Temp 97.3°F | Ht 70.0 in | Wt 169.0 lb

## 2020-12-25 DIAGNOSIS — J449 Chronic obstructive pulmonary disease, unspecified: Secondary | ICD-10-CM | POA: Diagnosis not present

## 2020-12-25 DIAGNOSIS — R0602 Shortness of breath: Secondary | ICD-10-CM | POA: Diagnosis not present

## 2020-12-25 LAB — PULMONARY FUNCTION TEST
DL/VA % pred: 85 %
DL/VA: 3.35 ml/min/mmHg/L
DLCO cor % pred: 51 %
DLCO cor: 12.82 ml/min/mmHg
DLCO unc % pred: 51 %
DLCO unc: 12.82 ml/min/mmHg
FEF 25-75 Post: 0.42 L/sec
FEF 25-75 Pre: 0.48 L/sec
FEF2575-%Change-Post: -13 %
FEF2575-%Pred-Post: 20 %
FEF2575-%Pred-Pre: 23 %
FEV1-%Change-Post: -4 %
FEV1-%Pred-Post: 40 %
FEV1-%Pred-Pre: 41 %
FEV1-Post: 1.18 L
FEV1-Pre: 1.23 L
FEV1FVC-%Change-Post: 5 %
FEV1FVC-%Pred-Pre: 67 %
FEV6-%Change-Post: -4 %
FEV6-%Pred-Post: 59 %
FEV6-%Pred-Pre: 62 %
FEV6-Post: 2.3 L
FEV6-Pre: 2.42 L
FEV6FVC-%Change-Post: 4 %
FEV6FVC-%Pred-Post: 106 %
FEV6FVC-%Pred-Pre: 102 %
FVC-%Change-Post: -9 %
FVC-%Pred-Post: 55 %
FVC-%Pred-Pre: 61 %
FVC-Post: 2.3 L
FVC-Pre: 2.53 L
Post FEV1/FVC ratio: 51 %
Post FEV6/FVC ratio: 100 %
Pre FEV1/FVC ratio: 49 %
Pre FEV6/FVC Ratio: 95 %
RV % pred: 168 %
RV: 4.4 L
TLC % pred: 108 %
TLC: 7.63 L

## 2020-12-25 NOTE — Progress Notes (Signed)
William Kirk    [id number]    [birth year]  Primary Care Physician:Bhuvaneswaran, Myrtha Mantis  Referring Physician: Floreen Comber, PA-C [address] [place],  [place] [address]  Chief complaint: Follow-up for COPD  HPI: 80 year old with history of COPD, chronic bronchiectasis, recurrent infections, chronic aspiration, hemoptysis status post right lower lobe ectomy in September 2020 complicated by prolonged infection, empyema, chest tube placement at [facility] Previously followed at the [facility] and is here to establish care as his pulmonologist is leaving the [place] system  Maintained on Spiriva and Wixela.  He has ProAir rescue inhaler and nebs which he uses occasionally.  He is adhering to mucociliary clearance with percussion vest, flutter valve and Mucinex Complains of chronic cough with white mucus, chest congestion.  Denies any fevers, chills, dyspnea  He had recurrent infections in the past requiring antibiotics.  Bronchoscopy was performed in November 2018.  Since 2020 after surgery is more stable with no significant exacerbations or hospitalizations.  He also has history of chronic aspiration and is being followed by speech therapy at the [place]  Pets: Dogs Occupation: Works in [employer].  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  No ongoing exposures at home.  No feather pillows or comforter Smoking history: 30-pack-year smoker.  Quit in 2015 Travel history: No significant travel history Relevant family history: No family history of lung disease  Interim history: Here for follow-up of PFTs States that breathing is doing well on current inhaler regimen He is being evaluated by speech pathology for dysphagia.  Outpatient Encounter Medications as of 12/25/2020  Medication Sig   acetaminophen (TYLENOL) 500 MG tablet TAKE ONE TABLET BY MOUTH THREE TIMES A DAY FOR CHRONIC PAIN    albuterol (VENTOLIN HFA) 108 (90 Base) MCG/ACT inhaler Inhale 2 puffs into the lungs every 4 (four) hours as needed for wheezing or shortness of breath.   aspirin EC 81 MG tablet Take 81 mg by mouth daily. Swallow whole.   atorvastatin (LIPITOR) 20 MG tablet Place 1 tablet (20 mg total) into feeding tube at bedtime. (Patient taking differently: Place 20 mg into feeding tube at bedtime. TAKE ALL WITH  APPLESAUCE)   budesonide-formoterol (SYMBICORT) 160-4.5 MCG/ACT inhaler Inhale 2 puffs into the lungs 2 (two) times daily.   diclofenac (VOLTAREN) 50 MG EC tablet Take 1 tablet by mouth 2 (two) times daily as needed.   diclofenac sodium (VOLTAREN) 1 % GEL Apply 2 g topically 4 (four) times daily as needed (pain).    fluticasone-salmeterol (ADVAIR) 250-50 MCG/ACT AEPB Inhale into the lungs every morning.   gabapentin (NEURONTIN) 800 MG tablet Take 1 tablet (800 mg total) by mouth 3 (three) times daily.   influenza vaccine adjuvanted (FLUAD) 0.5 ML injection Inject into the muscle.   levalbuterol (XOPENEX) 0.63 MG/3ML nebulizer solution Take 3 mLs (0.63 mg total) by nebulization every 4 (four) hours as needed for wheezing or shortness of breath.   lidocaine (LIDODERM) 5 % Place 1 patch onto the skin daily as needed (pain).    methocarbamol (ROBAXIN) 500 MG tablet Take 1 tablet (500 mg total) by mouth 2 (two) times daily as needed for muscle spasms.   metoprolol tartrate (LOPRESSOR) 25 MG tablet Place 12.5 mg into feeding tube daily. CRUSH MED   sertraline (ZOLOFT) 100 MG tablet Place 2 tablets (200 mg total) into feeding tube daily.   Tiotropium  Bromide Monohydrate 2.5 MCG/ACT AERS Inhale 2 puffs into the lungs daily.   traZODone (DESYREL) 50 MG tablet Place 1 tablet (50 mg total) into feeding tube at bedtime.   No facility-administered encounter medications on file as of 12/25/2020.    Physical Exam: Blood pressure (!) 122/58, pulse (!) 56, temperature (!) 97.3 F (36.3 C), temperature source Oral,  height 5\' 10"  (1.778 m), weight 169 lb (76.7 kg), SpO2 96 %. Gen:      No acute distress HEENT:  EOMI, sclera anicteric Neck:     No masses; no thyromegaly Lungs:    Clear to auscultation bilaterally; normal respiratory effort CV:         Regular rate and rhythm; no murmurs Abd:      + bowel sounds; soft, non-tender; no palpable masses, no distension Ext:    No edema; adequate peripheral perfusion Skin:      Warm and dry; no rash Neuro: alert and oriented x 3 Psych: normal mood and affect   Data Reviewed: Imaging: CT report from the [place] dated 09/02/2020-postsurgical changes of right lower lobectomy, secretions and debris noted in the bronchus intermedius, bronchial stump, mild emphysema, irregular stable opacities in the right lung, bronchiectasis, atelectasis  Modified barium swallow 12/16/2020 moderate pharyngeal-cervical esophageal  dysphagia  PFTs: 12/25/2020 FVC 2.30 [55%], FEV1 1.18 [40%], F/F 51, TLC 7.63 [108%], DLCO 12.82 [51%] Severe obstruction with air trapping Moderate-severe diffusion defect  Labs:  Data reviewed from the [place] PFTs May 2017-FEV1 2.38 [76%), ratio 58, no restriction, DLCO 113% Bronchoscopy November 2018-BAL growing strep angina gnosis Serum IgG 2017 normal level 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  Will get images from his recent CT for personal review.  He will need repeat CT in 1 year PFTs reviewed with obstruction, air trapping and diffusion defect  His inhaler regimen is optimized and will continue Spiriva and Wixela Encouraged to continue mucociliary clearance with percussion devices and Mucinex.  Dysphagia, chronic aspiration Modified barium swallow reviewed  with moderate pharyngeal-cervical esophageal dysphagia Follow-up with speech pathology pending  Plan/Recommendations: Continue inhalers Continue percussion vest, flutter valve and Mucinex Obtain images from the [place]  Chilton Greathouse MD [facility] 12/25/2020, 11:19 AM  CC: Floreen Comber, *

## 2020-12-25 NOTE — Patient Instructions (Signed)
Full PFT performed today. °

## 2020-12-25 NOTE — Patient Instructions (Signed)
I am glad you are doing well with your breathing I have reviewed your PFTs which do show some reduction due to COPD and lung surgery Continue inhalers Follow-up in 6 months

## 2020-12-25 NOTE — Progress Notes (Signed)
Full PFT performed today. °

## 2020-12-28 ENCOUNTER — Telehealth: Payer: Self-pay | Admitting: Physical Medicine and Rehabilitation

## 2020-12-28 ENCOUNTER — Other Ambulatory Visit: Payer: Self-pay | Admitting: Physical Medicine and Rehabilitation

## 2020-12-28 MED ORDER — GABAPENTIN 800 MG PO TABS: 800.0000 mg | ORAL_TABLET | Freq: Three times a day (TID) | ORAL | 3 refills | Status: DC

## 2020-12-28 NOTE — Telephone Encounter (Signed)
[facility] Gab[facility]

## 2021-01-05 ENCOUNTER — Encounter (HOSPITAL_COMMUNITY): Payer: Self-pay

## 2021-01-05 ENCOUNTER — Ambulatory Visit (HOSPITAL_COMMUNITY)
Admission: RE | Admit: 2021-01-05 | Discharge: 2021-01-05 | Disposition: A | Discharge status: Home / Self Care | Payer: No Typology Code available for payment source | Source: Ambulatory Visit | Attending: Vascular Surgery | Admitting: Vascular Surgery

## 2021-01-05 ENCOUNTER — Ambulatory Visit: Payer: No Typology Code available for payment source | Admitting: Vascular Surgery

## 2021-01-05 ENCOUNTER — Other Ambulatory Visit: Payer: Self-pay

## 2021-01-05 DIAGNOSIS — I6523 Occlusion and stenosis of bilateral carotid arteries: Secondary | ICD-10-CM | POA: Insufficient documentation

## 2021-01-05 LAB — POCT I-STAT CREATININE: Creatinine, Ser: 1.5 mg/dL — ABNORMAL HIGH (ref 0.61–1.24)

## 2021-01-05 MED ORDER — IOHEXOL 350 MG/ML SOLN
75.0000 mL | Freq: Once | INTRAVENOUS | Status: AC | PRN
  Administered 2021-01-05: 75 mL via INTRAVENOUS

## 2021-01-07 ENCOUNTER — Ambulatory Visit: Payer: No Typology Code available for payment source | Admitting: Vascular Surgery

## 2021-01-07 ENCOUNTER — Other Ambulatory Visit: Payer: Self-pay

## 2021-01-07 DIAGNOSIS — I6523 Occlusion and stenosis of bilateral carotid arteries: Secondary | ICD-10-CM

## 2021-01-07 NOTE — Progress Notes (Signed)
Reviewed CT angiogram findings personally in detail. Reviewed with [facility]. Anatomy appears amenable to TCAR. Given his difficulty with swallowing and aspiration, I feel this to be best option for treating severe asymptomatic disease. Reviewed the above with the patient and his wife by telephone. They are in agreement. Will plan for R TCAR after the holidays.   Yevonne Aline. Stanford Breed, MD [facility] Phone Number: [phone] 01/07/2021 12:46 PM

## 2021-01-08 ENCOUNTER — Other Ambulatory Visit: Payer: Self-pay

## 2021-01-08 DIAGNOSIS — I6523 Occlusion and stenosis of bilateral carotid arteries: Secondary | ICD-10-CM

## 2021-01-12 MED ORDER — CLOPIDOGREL BISULFATE 75 MG PO TABS: 75.0000 mg | ORAL_TABLET | Freq: Every day | ORAL | 5 refills | Status: AC

## 2021-01-12 NOTE — Addendum Note (Signed)
Addended by: Nicholas Lose on: 01/12/2021 02:31 PM   Modules accepted: Orders

## 2021-01-13 NOTE — Therapy (Signed)
[facility] [address] [place], [place], [address] Phone: [phone]   Fax:  [phone]  Patient Details  Name: William Kirk MRN: [id number] Date of Birth: 09-10-1940 Referring Provider:  Floreen Comber, PA-C  Encounter Date: 01/13/2021   SPEECH THERAPY DISCHARGE SUMMARY  Visits from Start of Care: 12  Current functional level related to goals / functional outcomes: See below. After modified barium swallow exam 12-16-20, pt advised to remain on regular diet and thin liquids with precautions. Continue with EMST, flutter valve, and make sure to stay active to reduce/eliminate risk of aspiration PNA. SLP called wife/pt on 01-13-21 and confirmed pt was doing well, no overt s/sx aspiration PNA, beginning to make head turn and tuck a habit and is requiring less cues from wife during meals. Wife confirmed pt is good to continue on his own without return to skilled ST at this time and will be d/c'd.  SLP Short Term Goals - 08/14/20 1743                SLP SHORT TERM GOAL #1    Title Pt will follow swallow precautions with usual min A over 2 sessions     Status Not Met          SLP SHORT TERM GOAL #2    Title Pt will complete HEP for dysphagia with usual mod A over 2 sessions     Baseline 08-05-20     Status Partially Met          SLP SHORT TERM GOAL #3    Title Pt will complete RMT 2 sets of 25 reps at 70% of MEP/MIP with rare min A     Status Not Met                     SLP Long Term Goals - 09/04/20 1312                SLP LONG TERM GOAL #1    Title Pt will follow swallow protocol with occasoinal min A from spouse over 2 weeks     Time 1     Period Weeks     Status On-going          SLP LONG TERM GOAL #2    Title Pt will complete HEP for dysphagia with occasional min-mod A over 2 sessions     Time 1     Period Weeks     Status On-going          SLP LONG TERM GOAL #3    Title Pt will complete RMT at 70%  MIP/MEP to reduce s/s of apsiration and wet voice to less than 3x a meal     Time 2     Period Weeks     Status On-going          SLP LONG TERM GOAL #4    Title Spouse will verbalize 3 s/s of aspiration pna     Status Achieved                     Plan - 09/04/20 1311       Clinical Impression Statement William Kirk has progressed to requring intermittent cueing, visual and verbal, to carryover swallow precations of chin tuck to the left. Spouse cont'd with appropriate cueing and supervision for pt to complete HEP accurately. Due to memory issues, William Kirk will need ongoing cues from family to  carrover diet recommendations and precautions.  Continue skilled ST at once/week to maximize safety of swallow and reduce risk of hospitalization from aspiration pna. Pt will likely complete MBSS and then require 1-2 follow up visits.     Speech Therapy Frequency 1x /week     Duration 8 weeks   17 visits    Treatment/Interventions Aspiration precaution training;Environmental controls;Oral motor exercises;SLP instruction and feedback;Compensatory techniques;Cognitive reorganization;Functional tasks;Pharyngeal strengthening exercises;Diet toleration management by SLP;Trials of upgraded texture/liquids;Internal/external aids;Patient/family education;Compensatory strategies;Other (comment)     Potential to Achieve Goals Good     Potential Considerations Ability to learn/carryover information        Remaining deficits: Moderate pharyngeal-cervical esophageal dysphagia.   Education / Equipment: HEP procedure, swallow precautions, overt s/sx aspiration PNA, clues to make HEP completion easier for pt   Patient agrees to discharge. Patient goals were partially met. Patient is being discharged due to  reached max potential at this time.Marland Kitchen   William Kirk, CCC-SLP 01/13/2021, 11:19 AM  [facility] [address], [address] [place], [place], [address] Phone: [phone]   Fax:   [phone]

## 2021-01-14 ENCOUNTER — Encounter: Payer: Self-pay | Admitting: Physical Medicine and Rehabilitation

## 2021-02-17 ENCOUNTER — Telehealth: Payer: Self-pay | Admitting: Pulmonary Disease

## 2021-02-17 MED ORDER — BENZONATATE 200 MG PO CAPS: 200.0000 mg | ORAL_CAPSULE | Freq: Three times a day (TID) | ORAL | 1 refills | Status: DC | PRN

## 2021-02-17 NOTE — Telephone Encounter (Signed)
Called and spoke with wife [name] to let her know of the recs from Dr. Vaughan Browner. She expressed understanding and gave me preferred pharmacy information. RX has been sent for Tessalon perles. Nothing further needed at this time.

## 2021-02-17 NOTE — Telephone Encounter (Signed)
Looks like he will need to be on the Plavix due to the carotid artery disease and upcoming surgery.  Not sure if it needs to be continued after the surgery.  Please call in Tessalon Perles to suppress the cough.  He can also take Delsym over-the-counter. Continue the inhalers If symptoms persist then call us back and we can get a CT chest for evaluation

## 2021-02-17 NOTE — Telephone Encounter (Signed)
Called and spoke with patient's [name] who states that pt is coughing up blood. Only about 1-2 tablespoons for the last three consecutive days. Has not coughed up anything yet today.  Wonders what they need to do. Pt is on Plavix starting in December. No temp. Supposed to have a Right Transcarotid Artery Revascularization next Wednesday William Kirk 20[address] and blood work Monday. They have done all of his normal pulmonary meds.      Dr. Vaughan Browner Please advise.

## 2021-02-18 ENCOUNTER — Encounter: Payer: No Typology Code available for payment source | Admitting: Physical Medicine and Rehabilitation

## 2021-02-19 ENCOUNTER — Telehealth: Payer: Self-pay | Admitting: *Deleted

## 2021-02-19 NOTE — Telephone Encounter (Signed)
Patient wife called and left message stating her husband was coughing up a little bit of blood over last three days estimates a total of about 1 tablespoonful. She has contacted pulmonary which has addressed this issue calling in medication for cough and advised patient if amount of blood increases let them know and they will order CT Chest. Patient is scheduled for TCAR 02/24/21 with Dr Stanford Breed and is on Plavix and 81mg  ASA. Spoke with Dr Virl Cagey who then called Dr Stanford Breed  with information. Dr Stanford Breed is aware and will continue with surgery if Pulmonary is in agreement. Patient will report if any increase in coughing up blood. Left message for patient that Dr Stanford Breed is aware and to call if any changes.

## 2021-02-19 NOTE — Progress Notes (Signed)
Surgical Instructions    Your procedure is scheduled on Wednesday, January, 18th, 2023.   Report to [facility] Main Entrance "A" at 06:30 A.M., then check in with the Admitting office.  Call this number if you have problems the morning of surgery:  [phone]   If you have any questions prior to your surgery date call [phone]: Open Monday-Friday 8am-4pm    Remember:  Do not eat or drink after midnight the night before your surgery     Take these medicines the morning of surgery with A SIP OF WATER:  gabapentin (NEURONTIN)  metoprolol tartrate (LOPRESSOR) Tiotropium Bromide Monohydrate (SPIRIVA RESPIMAT) sertraline (ZOLOFT)   If needed:   acetaminophen (TYLENOL)  albuterol (PROVENTIL) albuterol (VENTOLIN HFA)  benzonatate (TESSALON) fluticasone-salmeterol (ADVAIR) methocarbamol (ROBAXIN) Propylene Glycol (SYSTANE COMPLETE)   Please bring all inhalers with you the day of surgery.     Follow your surgeon's instructions on when to stop Aspirin and Plavix.  If no instructions were given by your surgeon then you will need to call the office to get those instructions.     As of today, STOP taking any Aspirin (unless otherwise instructed by your surgeon) Aleve, Naproxen, Ibuprofen, Motrin, Advil, Goody's, BC's, all herbal medications, fish oil, and all vitamins.   After your COVID test   You are not required to quarantine however you are required to wear a well-fitting mask when you are out and around people not in your household.  If your mask becomes wet or soiled, replace with a new one.  Wash your hands often with soap and water for 20 seconds or clean your hands with an alcohol-based hand sanitizer that contains at least 60% alcohol.  Do not share personal items.  Notify your provider: if you are in close contact with someone who has COVID  or if you develop a fever of 100.4 or greater, sneezing, cough, sore throat, shortness of breath or body aches.    The  day of surgery:          Do not wear jewelry  Do not wear lotions, powders, colognes, or deodorant. Men may shave face and neck. Do not bring valuables to the hospital.              [facility] is not responsible for any belongings or valuables.  Do NOT Smoke (Tobacco/Vaping)  24 hours prior to your procedure  If you use a CPAP at night, you may bring your mask for your overnight stay.   Contacts, glasses, hearing aids, dentures or partials may not be worn into surgery, please bring cases for these belongings   For patients admitted to the hospital, discharge time will be determined by your treatment team.   Patients discharged the day of surgery will not be allowed to drive home, and someone needs to stay with them for 24 hours.  NO VISITORS WILL BE ALLOWED IN PRE-OP WHERE PATIENTS ARE PREPPED FOR SURGERY.  ONLY 1 SUPPORT PERSON MAY BE PRESENT IN THE WAITING ROOM WHILE YOU ARE IN SURGERY.  IF YOU ARE TO BE ADMITTED, ONCE YOU ARE IN YOUR ROOM YOU WILL BE ALLOWED TWO (2) VISITORS. 1 (ONE) VISITOR MAY STAY OVERNIGHT BUT MUST ARRIVE TO THE ROOM BY 8pm.  Minor children may have two parents present. Special consideration for safety and communication needs will be reviewed on a case by case basis.  Special instructions:    Oral Hygiene is also important to reduce your risk of infection.  Remember - BRUSH  YOUR TEETH THE MORNING OF SURGERY WITH YOUR REGULAR TOOTHPASTE   [facility]- Preparing For Surgery  Before surgery, you can play an important role. Because skin is not sterile, your skin needs to be as free of germs as possible. You can reduce the number of germs on your skin by washing with CHG (chlorahexidine gluconate) Soap before surgery.  CHG is an antiseptic cleaner which kills germs and bonds with the skin to continue killing germs even after washing.     Please do not use if you have an allergy to CHG or antibacterial soaps. If your skin becomes reddened/irritated stop using the  CHG.  Do not shave (including legs and underarms) for at least 48 hours prior to first CHG shower. It is OK to shave your face.  Please follow these instructions carefully.     Shower the NIGHT BEFORE SURGERY and the MORNING OF SURGERY with CHG Soap.   If you chose to wash your hair, wash your hair first as usual with your normal shampoo. After you shampoo, rinse your hair and body thoroughly to remove the shampoo.  Then [employer] and genitals (private parts) with your normal soap and rinse thoroughly to remove soap.  After that Use CHG Soap as you would any other liquid soap. You can apply CHG directly to the skin and wash gently with a scrungie or a clean washcloth.   Apply the CHG Soap to your body ONLY FROM THE NECK DOWN.  Do not use on open wounds or open sores. Avoid contact with your eyes, ears, mouth and genitals (private parts). Wash Face and genitals (private parts)  with your normal soap.   Wash thoroughly, paying special attention to the area where your surgery will be performed.  Thoroughly rinse your body with warm water from the neck down.  DO NOT shower/wash with your normal soap after using and rinsing off the CHG Soap.  Pat yourself dry with a CLEAN TOWEL.  Wear CLEAN PAJAMAS to bed the night before surgery  Place CLEAN SHEETS on your bed the night before your surgery  DO NOT SLEEP WITH PETS.   Day of Surgery:  Take a shower with CHG soap. Wear Clean/Comfortable clothing the morning of surgery Do not apply any deodorants/lotions.   Remember to brush your teeth WITH YOUR REGULAR TOOTHPASTE.   Please read over the following fact sheets that you were given.

## 2021-02-22 ENCOUNTER — Encounter (HOSPITAL_COMMUNITY): Payer: Self-pay

## 2021-02-22 ENCOUNTER — Other Ambulatory Visit: Payer: Self-pay

## 2021-02-22 ENCOUNTER — Encounter (HOSPITAL_COMMUNITY)
Admission: RE | Admit: 2021-02-22 | Discharge: 2021-02-22 | Disposition: A | Discharge status: Home / Self Care | Payer: No Typology Code available for payment source | Source: Ambulatory Visit | Attending: Vascular Surgery | Admitting: Vascular Surgery

## 2021-02-22 VITALS — BP 123/56 | HR 57 | Temp 97.4°F | Resp 17 | Ht 67.0 in | Wt 169.5 lb

## 2021-02-22 DIAGNOSIS — Z20822 Contact with and (suspected) exposure to covid-19: Secondary | ICD-10-CM | POA: Diagnosis not present

## 2021-02-22 DIAGNOSIS — I6523 Occlusion and stenosis of bilateral carotid arteries: Secondary | ICD-10-CM | POA: Diagnosis not present

## 2021-02-22 DIAGNOSIS — Z01818 Encounter for other preprocedural examination: Secondary | ICD-10-CM | POA: Diagnosis present

## 2021-02-22 LAB — COMPREHENSIVE METABOLIC PANEL
ALT: 18 U/L (ref 0–44)
AST: 24 U/L (ref 15–41)
Albumin: 3.5 g/dL (ref 3.5–5.0)
Alkaline Phosphatase: 111 U/L (ref 38–126)
Anion gap: 10 (ref 5–15)
BUN: 22 mg/dL (ref 8–23)
CO2: 29 mmol/L (ref 22–32)
Calcium: 9.1 mg/dL (ref 8.9–10.3)
Chloride: 104 mmol/L (ref 98–111)
Creatinine, Ser: 1.42 mg/dL — ABNORMAL HIGH (ref 0.61–1.24)
GFR, Estimated: 51 mL/min — ABNORMAL LOW (ref >60)
Glucose, Bld: 112 mg/dL — ABNORMAL HIGH (ref 70–99)
Potassium: 4.4 mmol/L (ref 3.5–5.1)
Sodium: 143 mmol/L (ref 135–145)
Total Bilirubin: 0.5 mg/dL (ref 0.3–1.2)
Total Protein: 7.7 g/dL (ref 6.5–8.1)

## 2021-02-22 LAB — APTT: aPTT: 30 seconds (ref 24–36)

## 2021-02-22 LAB — SARS CORONAVIRUS 2 (TAT 6-24 HRS): SARS Coronavirus 2: NEGATIVE

## 2021-02-22 LAB — URINALYSIS, ROUTINE W REFLEX MICROSCOPIC
Bilirubin Urine: NEGATIVE
Glucose, UA: NEGATIVE mg/dL
Hgb urine dipstick: NEGATIVE
Ketones, ur: NEGATIVE mg/dL
Leukocytes,Ua: NEGATIVE
Nitrite: NEGATIVE
Protein, ur: NEGATIVE mg/dL
Specific Gravity, Urine: >1.03 — ABNORMAL HIGH (ref 1.005–1.030)
pH: 5.5 (ref 5.0–8.0)

## 2021-02-22 LAB — CBC
HCT: 42.2 % (ref 39.0–52.0)
Hemoglobin: 12.6 g/dL — ABNORMAL LOW (ref 13.0–17.0)
MCH: 27 pg (ref 26.0–34.0)
MCHC: 29.9 g/dL — ABNORMAL LOW (ref 30.0–36.0)
MCV: 90.4 fL (ref 80.0–100.0)
Platelets: 290 10*3/uL (ref 150–400)
RBC: 4.67 MIL/uL (ref 4.22–5.81)
RDW: 16.2 % — ABNORMAL HIGH (ref 11.5–15.5)
WBC: 8.2 10*3/uL (ref 4.0–10.5)
nRBC: 0 % (ref 0.0–0.2)

## 2021-02-22 LAB — TYPE AND SCREEN
ABO/RH(D): O POS
Antibody Screen: NEGATIVE

## 2021-02-22 LAB — PROTIME-INR
INR: 1 (ref 0.8–1.2)
Prothrombin Time: 13.1 seconds (ref 11.4–15.2)

## 2021-02-22 LAB — SURGICAL PCR SCREEN
MRSA, PCR: NEGATIVE
Staphylococcus aureus: NEGATIVE

## 2021-02-22 NOTE — Progress Notes (Signed)
PCP: Lorenza Cambridge, MD Cardiologist: Modesto Charon, MD.  Patient unsure.   EKG: 02/22/21 CXR: na ECHO: 09/26/16 Stress Test: denies Cardiac Cath: denies  Fasting Blood Sugar- na Checks Blood Sugar__na_ times a day  OSA/CPAP: No  ASA/Plavix:  Patient to call Dr. Mora Appl office today for instructions  Covid test 02/22/21  Anesthesia Review: Yes, patient on blood thinner  Patient denies shortness of breath, fever, cough, and chest pain at PAT appointment.  Patient verbalized understanding of instructions provided today at the PAT appointment.  Patient asked to review instructions at home and day of surgery.

## 2021-02-23 NOTE — Anesthesia Preprocedure Evaluation (Addendum)
Anesthesia Evaluation  Patient identified by MRN, date of birth, ID band Patient awake    Reviewed: Allergy & Precautions, NPO status , Patient's Chart, lab work & pertinent test results, reviewed documented beta blocker date and time   History of Anesthesia Complications Negative for: history of anesthetic complications  Airway Mallampati: I   Neck ROM: Full    Dental  (+) Edentulous Upper, Edentulous Lower   Pulmonary COPD,  COPD inhaler, former smoker,  02/22/2021 SARS Coronavirus NEG   breath sounds clear to auscultation       Cardiovascular hypertension, Pt. on medications and Pt. on home beta blockers (-) angina+ Peripheral Vascular Disease   Rhythm:Regular Rate:Normal  '18 ECHO: EF 50-55%, normal LVF, Grade 1 DD, mild MR   Neuro/Psych Anxiety Depression negative neurological ROS     GI/Hepatic Neg liver ROS, PUD,   Endo/Other  negative endocrine ROS  Renal/GU Renal InsufficiencyRenal disease     Musculoskeletal  (+) Arthritis ,   Abdominal   Peds  Hematology negative hematology ROS (+)   Anesthesia Other Findings   Reproductive/Obstetrics                            Anesthesia Physical Anesthesia Plan  ASA: 3  Anesthesia Plan: General   Post-op Pain Management: Tylenol PO (pre-op) and Minimal or no pain anticipated   Induction: Intravenous  PONV Risk Score and Plan: 2 and Ondansetron and Dexamethasone  Airway Management Planned: Oral ETT  Additional Equipment: Arterial line  Intra-op Plan:   Post-operative Plan: Extubation in OR  Informed Consent: I have reviewed the patients History and Physical, chart, labs and discussed the procedure including the risks, benefits and alternatives for the proposed anesthesia with the patient or authorized representative who has indicated his/her understanding and acceptance.       Plan Discussed with: CRNA and  Surgeon  Anesthesia Plan Comments:        Anesthesia Quick Evaluation

## 2021-02-24 ENCOUNTER — Encounter (HOSPITAL_COMMUNITY)
Admission: RE | Disposition: A | Discharge status: Home Health | Payer: Self-pay | Source: Home / Self Care | Attending: Vascular Surgery

## 2021-02-24 ENCOUNTER — Inpatient Hospital Stay (HOSPITAL_COMMUNITY): Payer: No Typology Code available for payment source

## 2021-02-24 ENCOUNTER — Encounter (HOSPITAL_COMMUNITY): Payer: Self-pay | Admitting: Vascular Surgery

## 2021-02-24 ENCOUNTER — Other Ambulatory Visit: Payer: Self-pay

## 2021-02-24 ENCOUNTER — Inpatient Hospital Stay (HOSPITAL_COMMUNITY)
Admission: RE | Admit: 2021-02-24 | Discharge: 2021-02-25 | DRG: 036 | Disposition: A | Discharge status: Home Health | Payer: No Typology Code available for payment source | Attending: Vascular Surgery | Admitting: Vascular Surgery

## 2021-02-24 DIAGNOSIS — Z006 Encounter for examination for normal comparison and control in clinical research program: Secondary | ICD-10-CM | POA: Diagnosis not present

## 2021-02-24 DIAGNOSIS — Z79891 Long term (current) use of opiate analgesic: Secondary | ICD-10-CM

## 2021-02-24 DIAGNOSIS — M1990 Unspecified osteoarthritis, unspecified site: Secondary | ICD-10-CM | POA: Diagnosis present

## 2021-02-24 DIAGNOSIS — Z79899 Other long term (current) drug therapy: Secondary | ICD-10-CM | POA: Diagnosis not present

## 2021-02-24 DIAGNOSIS — Z931 Gastrostomy status: Secondary | ICD-10-CM

## 2021-02-24 DIAGNOSIS — R1310 Dysphagia, unspecified: Secondary | ICD-10-CM | POA: Diagnosis present

## 2021-02-24 DIAGNOSIS — I70202 Unspecified atherosclerosis of native arteries of extremities, left leg: Secondary | ICD-10-CM | POA: Diagnosis present

## 2021-02-24 DIAGNOSIS — J449 Chronic obstructive pulmonary disease, unspecified: Secondary | ICD-10-CM | POA: Diagnosis present

## 2021-02-24 DIAGNOSIS — Z7902 Long term (current) use of antithrombotics/antiplatelets: Secondary | ICD-10-CM

## 2021-02-24 DIAGNOSIS — Z96641 Presence of right artificial hip joint: Secondary | ICD-10-CM | POA: Diagnosis present

## 2021-02-24 DIAGNOSIS — I6521 Occlusion and stenosis of right carotid artery: Principal | ICD-10-CM | POA: Diagnosis present

## 2021-02-24 DIAGNOSIS — I10 Essential (primary) hypertension: Secondary | ICD-10-CM | POA: Diagnosis present

## 2021-02-24 DIAGNOSIS — Z87891 Personal history of nicotine dependence: Secondary | ICD-10-CM

## 2021-02-24 DIAGNOSIS — Z888 Allergy status to other drugs, medicaments and biological substances status: Secondary | ICD-10-CM

## 2021-02-24 DIAGNOSIS — Z7982 Long term (current) use of aspirin: Secondary | ICD-10-CM | POA: Diagnosis not present

## 2021-02-24 HISTORY — PX: TRANSCAROTID ARTERY REVASCULARIZATIONÂ: SHX6778

## 2021-02-24 HISTORY — PX: ULTRASOUND GUIDANCE FOR VASCULAR ACCESS: SHX6516

## 2021-02-24 LAB — POCT ACTIVATED CLOTTING TIME: Activated Clotting Time: 257 seconds

## 2021-02-24 SURGERY — TRANSCAROTID ARTERY REVASCULARIZATION (TCAR)
Anesthesia: General | Site: Neck | Laterality: Right

## 2021-02-24 MED ORDER — METOPROLOL TARTRATE 5 MG/5ML IV SOLN: 2.0000 mg | INTRAVENOUS | Status: DC | PRN

## 2021-02-24 MED ORDER — EPHEDRINE SULFATE-NACL 50-0.9 MG/10ML-% IV SOSY
PREFILLED_SYRINGE | INTRAVENOUS | Status: DC | PRN
Start: 2021-02-24 — End: 2021-02-24
  Administered 2021-02-24: 5 mg via INTRAVENOUS

## 2021-02-24 MED ORDER — SERTRALINE HCL 100 MG PO TABS
200.0000 mg | ORAL_TABLET | Freq: Every day | ORAL | Status: DC
  Administered 2021-02-24 – 2021-02-25 (×2): 200 mg via ORAL
  Filled 2021-02-24 (×2): qty 2

## 2021-02-24 MED ORDER — ONDANSETRON HCL 4 MG/2ML IJ SOLN
INTRAMUSCULAR | Status: DC | PRN
Start: 2021-02-24 — End: 2021-02-24
  Administered 2021-02-24: 4 mg via INTRAVENOUS

## 2021-02-24 MED ORDER — HYDRALAZINE HCL 20 MG/ML IJ SOLN: 5.0000 mg | INTRAMUSCULAR | Status: DC | PRN

## 2021-02-24 MED ORDER — ALBUTEROL SULFATE HFA 108 (90 BASE) MCG/ACT IN AERS: 2.0000 | INHALATION_SPRAY | RESPIRATORY_TRACT | Status: DC | PRN

## 2021-02-24 MED ORDER — FENTANYL CITRATE (PF) 250 MCG/5ML IJ SOLN
INTRAMUSCULAR | Status: AC
  Filled 2021-02-24: qty 5

## 2021-02-24 MED ORDER — PHENOL 1.4 % MT LIQD: 1.0000 | OROMUCOSAL | Status: DC | PRN

## 2021-02-24 MED ORDER — DOUBLE ANTIBIOTIC 500-10000 UNIT/GM EX OINT
TOPICAL_OINTMENT | Freq: Two times a day (BID) | CUTANEOUS | Status: DC
  Administered 2021-02-24: 1 via TOPICAL
  Filled 2021-02-24: qty 28.4

## 2021-02-24 MED ORDER — DOCUSATE SODIUM 100 MG PO CAPS
100.0000 mg | ORAL_CAPSULE | Freq: Every day | ORAL | Status: DC
  Administered 2021-02-25: 100 mg via ORAL
  Filled 2021-02-24: qty 1

## 2021-02-24 MED ORDER — LIDOCAINE 2% (20 MG/ML) 5 ML SYRINGE
INTRAMUSCULAR | Status: DC | PRN
  Administered 2021-02-24: 40 mg via INTRAVENOUS

## 2021-02-24 MED ORDER — CHLORHEXIDINE GLUCONATE 0.12 % MT SOLN
15.0000 mL | Freq: Once | OROMUCOSAL | Status: AC
  Administered 2021-02-24: 15 mL via OROMUCOSAL
  Filled 2021-02-24: qty 15

## 2021-02-24 MED ORDER — LIDOCAINE 5 % EX PTCH: 1.0000 | MEDICATED_PATCH | Freq: Every day | CUTANEOUS | Status: DC | PRN

## 2021-02-24 MED ORDER — DEXAMETHASONE SODIUM PHOSPHATE 10 MG/ML IJ SOLN
INTRAMUSCULAR | Status: DC | PRN
  Administered 2021-02-24: 5 mg via INTRAVENOUS

## 2021-02-24 MED ORDER — ATORVASTATIN CALCIUM 10 MG PO TABS
20.0000 mg | ORAL_TABLET | Freq: Every day | ORAL | Status: DC
  Administered 2021-02-24: 20 mg via ORAL
  Filled 2021-02-24: qty 2

## 2021-02-24 MED ORDER — CEFAZOLIN SODIUM-DEXTROSE 2-4 GM/100ML-% IV SOLN
2.0000 g | INTRAVENOUS | Status: AC
  Administered 2021-02-24: 2 g via INTRAVENOUS
  Filled 2021-02-24: qty 100

## 2021-02-24 MED ORDER — ROCURONIUM BROMIDE 10 MG/ML (PF) SYRINGE
PREFILLED_SYRINGE | INTRAVENOUS | Status: AC
  Filled 2021-02-24: qty 10

## 2021-02-24 MED ORDER — PHENYLEPHRINE 40 MCG/ML (10ML) SYRINGE FOR IV PUSH (FOR BLOOD PRESSURE SUPPORT)
PREFILLED_SYRINGE | INTRAVENOUS | Status: DC | PRN
  Administered 2021-02-24: 60 ug via INTRAVENOUS
  Administered 2021-02-24: 80 ug via INTRAVENOUS
  Administered 2021-02-24: 40 ug via INTRAVENOUS
  Administered 2021-02-24 (×2): 80 ug via INTRAVENOUS
  Administered 2021-02-24: 60 ug via INTRAVENOUS
  Administered 2021-02-24: 40 ug via INTRAVENOUS

## 2021-02-24 MED ORDER — SODIUM CHLORIDE 0.9 % IV SOLN: INTRAVENOUS | Status: DC

## 2021-02-24 MED ORDER — ONDANSETRON HCL 4 MG/2ML IJ SOLN
INTRAMUSCULAR | Status: AC
  Filled 2021-02-24: qty 2

## 2021-02-24 MED ORDER — PANTOPRAZOLE SODIUM 40 MG PO TBEC
40.0000 mg | DELAYED_RELEASE_TABLET | Freq: Every day | ORAL | Status: DC
  Administered 2021-02-25: 40 mg via ORAL
  Filled 2021-02-24: qty 1

## 2021-02-24 MED ORDER — LIDOCAINE 2% (20 MG/ML) 5 ML SYRINGE
INTRAMUSCULAR | Status: AC
  Filled 2021-02-24: qty 5

## 2021-02-24 MED ORDER — GLYCOPYRROLATE PF 0.2 MG/ML IJ SOSY
PREFILLED_SYRINGE | INTRAMUSCULAR | Status: AC
  Filled 2021-02-24: qty 1

## 2021-02-24 MED ORDER — FENTANYL CITRATE (PF) 250 MCG/5ML IJ SOLN
INTRAMUSCULAR | Status: DC | PRN
  Administered 2021-02-24: 50 ug via INTRAVENOUS

## 2021-02-24 MED ORDER — HYDRALAZINE HCL 20 MG/ML IJ SOLN
INTRAMUSCULAR | Status: DC | PRN
  Administered 2021-02-24: 5 mg via INTRAVENOUS

## 2021-02-24 MED ORDER — ADULT MULTIVITAMIN W/MINERALS CH
1.0000 | ORAL_TABLET | Freq: Every day | ORAL | Status: DC
  Administered 2021-02-25: 1 via ORAL
  Filled 2021-02-24: qty 1

## 2021-02-24 MED ORDER — 0.9 % SODIUM CHLORIDE (POUR BTL) OPTIME
TOPICAL | Status: DC | PRN
  Administered 2021-02-24: 1000 mL

## 2021-02-24 MED ORDER — VITAMIN B-6 100 MG PO TABS: 100.0000 mg | ORAL_TABLET | Freq: Every day | ORAL | Status: DC

## 2021-02-24 MED ORDER — PHENYLEPHRINE HCL-NACL 20-0.9 MG/250ML-% IV SOLN
INTRAVENOUS | Status: DC | PRN
  Administered 2021-02-24: 30 ug/min via INTRAVENOUS

## 2021-02-24 MED ORDER — ASPIRIN EC 81 MG PO TBEC
81.0000 mg | DELAYED_RELEASE_TABLET | Freq: Every day | ORAL | Status: DC
  Administered 2021-02-25: 81 mg via ORAL
  Filled 2021-02-24: qty 1

## 2021-02-24 MED ORDER — GLYCOPYRROLATE 0.2 MG/ML IJ SOLN
INTRAMUSCULAR | Status: DC | PRN
Start: 2021-02-24 — End: 2021-02-24
  Administered 2021-02-24: .2 mg via INTRAVENOUS
  Administered 2021-02-24: .1 mg via INTRAVENOUS

## 2021-02-24 MED ORDER — ALBUTEROL SULFATE (2.5 MG/3ML) 0.083% IN NEBU: 2.5000 mg | INHALATION_SOLUTION | Freq: Four times a day (QID) | RESPIRATORY_TRACT | Status: DC | PRN

## 2021-02-24 MED ORDER — TRAZODONE HCL 50 MG PO TABS
50.0000 mg | ORAL_TABLET | Freq: Every day | ORAL | Status: DC
  Administered 2021-02-24: 50 mg via ORAL
  Filled 2021-02-24: qty 1

## 2021-02-24 MED ORDER — BENZONATATE 100 MG PO CAPS: 200.0000 mg | ORAL_CAPSULE | Freq: Three times a day (TID) | ORAL | Status: DC | PRN

## 2021-02-24 MED ORDER — BISACODYL 10 MG RE SUPP: 10.0000 mg | Freq: Every day | RECTAL | Status: DC | PRN

## 2021-02-24 MED ORDER — CHLORHEXIDINE GLUCONATE CLOTH 2 % EX PADS: 6.0000 | MEDICATED_PAD | Freq: Once | CUTANEOUS | Status: DC

## 2021-02-24 MED ORDER — ACETAMINOPHEN 650 MG RE SUPP: 325.0000 mg | RECTAL | Status: DC | PRN

## 2021-02-24 MED ORDER — VITAMIN D 50 MCG (2000 UT) PO TABS: 2000.0000 [IU] | ORAL_TABLET | Freq: Every day | ORAL | Status: DC

## 2021-02-24 MED ORDER — ROCURONIUM BROMIDE 10 MG/ML (PF) SYRINGE
PREFILLED_SYRINGE | INTRAVENOUS | Status: DC | PRN
  Administered 2021-02-24: 60 mg via INTRAVENOUS

## 2021-02-24 MED ORDER — DICLOFENAC SODIUM 1 % EX GEL
2.0000 g | Freq: Four times a day (QID) | CUTANEOUS | Status: DC | PRN
  Filled 2021-02-24: qty 100

## 2021-02-24 MED ORDER — POLYVINYL ALCOHOL 1.4 % OP SOLN
1.0000 [drp] | Freq: Every day | OPHTHALMIC | Status: DC | PRN
  Filled 2021-02-24: qty 15

## 2021-02-24 MED ORDER — DEXAMETHASONE SODIUM PHOSPHATE 10 MG/ML IJ SOLN
INTRAMUSCULAR | Status: AC
  Filled 2021-02-24: qty 1

## 2021-02-24 MED ORDER — PHENYLEPHRINE 40 MCG/ML (10ML) SYRINGE FOR IV PUSH (FOR BLOOD PRESSURE SUPPORT)
PREFILLED_SYRINGE | INTRAVENOUS | Status: AC
  Filled 2021-02-24: qty 10

## 2021-02-24 MED ORDER — GABAPENTIN 400 MG PO CAPS
800.0000 mg | ORAL_CAPSULE | Freq: Three times a day (TID) | ORAL | Status: DC
  Administered 2021-02-24 – 2021-02-25 (×3): 800 mg via ORAL
  Filled 2021-02-24 (×3): qty 2

## 2021-02-24 MED ORDER — POLYETHYLENE GLYCOL 3350 17 G PO PACK: 17.0000 g | PACK | Freq: Every day | ORAL | Status: DC | PRN

## 2021-02-24 MED ORDER — ACETAMINOPHEN 325 MG PO TABS: 325.0000 mg | ORAL_TABLET | ORAL | Status: DC | PRN

## 2021-02-24 MED ORDER — OXYCODONE-ACETAMINOPHEN 5-325 MG PO TABS: 1.0000 | ORAL_TABLET | ORAL | Status: DC | PRN

## 2021-02-24 MED ORDER — LACTATED RINGERS IV SOLN: INTRAVENOUS | Status: DC | PRN

## 2021-02-24 MED ORDER — IODIXANOL 320 MG/ML IV SOLN
INTRAVENOUS | Status: DC | PRN
  Administered 2021-02-24: 18 mL via INTRA_ARTERIAL

## 2021-02-24 MED ORDER — SODIUM CHLORIDE 0.9 % IV SOLN: 500.0000 mL | Freq: Once | INTRAVENOUS | Status: DC | PRN

## 2021-02-24 MED ORDER — LACTATED RINGERS IV SOLN: INTRAVENOUS | Status: DC

## 2021-02-24 MED ORDER — PROPOFOL 10 MG/ML IV BOLUS
INTRAVENOUS | Status: DC | PRN
  Administered 2021-02-24: 80 mg via INTRAVENOUS

## 2021-02-24 MED ORDER — ONDANSETRON HCL 4 MG/2ML IJ SOLN: 4.0000 mg | Freq: Four times a day (QID) | INTRAMUSCULAR | Status: DC | PRN

## 2021-02-24 MED ORDER — HEPARIN SODIUM (PORCINE) 1000 UNIT/ML IJ SOLN
INTRAMUSCULAR | Status: AC
  Filled 2021-02-24: qty 10

## 2021-02-24 MED ORDER — HEPARIN 6000 UNIT IRRIGATION SOLUTION
Status: DC | PRN
  Administered 2021-02-24: 1

## 2021-02-24 MED ORDER — BACITRACIN-POLYMYXIN B 500-10000 UNIT/GM EX OINT: 1.0000 "application " | TOPICAL_OINTMENT | Freq: Two times a day (BID) | CUTANEOUS | Status: DC

## 2021-02-24 MED ORDER — PROPOFOL 10 MG/ML IV BOLUS
INTRAVENOUS | Status: AC
  Filled 2021-02-24: qty 20

## 2021-02-24 MED ORDER — TIOTROPIUM BROMIDE MONOHYDRATE 2.5 MCG/ACT IN AERS: 2.0000 | INHALATION_SPRAY | RESPIRATORY_TRACT | Status: DC

## 2021-02-24 MED ORDER — SODIUM CHLORIDE 0.9 % IV SOLN
0.1500 ug/kg/min | INTRAVENOUS | Status: AC
  Administered 2021-02-24: .15 ug/kg/min via INTRAVENOUS
  Filled 2021-02-24: qty 1000

## 2021-02-24 MED ORDER — POTASSIUM CHLORIDE CRYS ER 20 MEQ PO TBCR: 20.0000 meq | EXTENDED_RELEASE_TABLET | Freq: Every day | ORAL | Status: DC | PRN

## 2021-02-24 MED ORDER — METHOCARBAMOL 500 MG PO TABS: 500.0000 mg | ORAL_TABLET | Freq: Two times a day (BID) | ORAL | Status: DC | PRN

## 2021-02-24 MED ORDER — METOPROLOL TARTRATE 12.5 MG HALF TABLET
12.5000 mg | ORAL_TABLET | Freq: Every day | ORAL | Status: DC
  Administered 2021-02-25: 12.5 mg via ORAL
  Filled 2021-02-24: qty 1

## 2021-02-24 MED ORDER — HYDROMORPHONE HCL 1 MG/ML IJ SOLN: 0.5000 mg | INTRAMUSCULAR | Status: DC | PRN

## 2021-02-24 MED ORDER — FERROUS SULFATE 325 (65 FE) MG PO TABS
325.0000 mg | ORAL_TABLET | Freq: Every day | ORAL | Status: DC
  Administered 2021-02-25: 325 mg via ORAL
  Filled 2021-02-24 (×2): qty 1

## 2021-02-24 MED ORDER — MAGNESIUM SULFATE 2 GM/50ML IV SOLN: 2.0000 g | Freq: Every day | INTRAVENOUS | Status: DC | PRN

## 2021-02-24 MED ORDER — HEPARIN SODIUM (PORCINE) 1000 UNIT/ML IJ SOLN
INTRAMUSCULAR | Status: DC | PRN
  Administered 2021-02-24: 8000 [IU] via INTRAVENOUS

## 2021-02-24 MED ORDER — ORAL CARE MOUTH RINSE: 15.0000 mL | Freq: Once | OROMUCOSAL | Status: AC

## 2021-02-24 MED ORDER — CEFAZOLIN SODIUM-DEXTROSE 2-4 GM/100ML-% IV SOLN
2.0000 g | Freq: Three times a day (TID) | INTRAVENOUS | Status: AC
  Administered 2021-02-24 – 2021-02-25 (×2): 2 g via INTRAVENOUS
  Filled 2021-02-24 (×2): qty 100

## 2021-02-24 MED ORDER — EPHEDRINE 5 MG/ML INJ
INTRAVENOUS | Status: AC
  Filled 2021-02-24: qty 5

## 2021-02-24 MED ORDER — FENTANYL CITRATE (PF) 100 MCG/2ML IJ SOLN: 25.0000 ug | INTRAMUSCULAR | Status: DC | PRN

## 2021-02-24 MED ORDER — MOMETASONE FURO-FORMOTEROL FUM 200-5 MCG/ACT IN AERO
2.0000 | INHALATION_SPRAY | Freq: Two times a day (BID) | RESPIRATORY_TRACT | Status: DC
  Filled 2021-02-24 (×2): qty 8.8

## 2021-02-24 MED ORDER — ACETAMINOPHEN 500 MG PO TABS
1000.0000 mg | ORAL_TABLET | Freq: Once | ORAL | Status: AC
  Administered 2021-02-24: 1000 mg via ORAL
  Filled 2021-02-24: qty 2

## 2021-02-24 MED ORDER — PROTAMINE SULFATE 10 MG/ML IV SOLN
INTRAVENOUS | Status: DC | PRN
  Administered 2021-02-24: 50 mg via INTRAVENOUS

## 2021-02-24 MED ORDER — SUGAMMADEX SODIUM 200 MG/2ML IV SOLN
INTRAVENOUS | Status: DC | PRN
  Administered 2021-02-24: 200 mg via INTRAVENOUS

## 2021-02-24 MED ORDER — CLOPIDOGREL BISULFATE 75 MG PO TABS
75.0000 mg | ORAL_TABLET | Freq: Every day | ORAL | Status: DC
  Administered 2021-02-25: 75 mg via ORAL
  Filled 2021-02-24: qty 1

## 2021-02-24 MED ORDER — LABETALOL HCL 5 MG/ML IV SOLN: 10.0000 mg | INTRAVENOUS | Status: DC | PRN

## 2021-02-24 MED ORDER — GUAIFENESIN-DM 100-10 MG/5ML PO SYRP: 15.0000 mL | ORAL_SOLUTION | ORAL | Status: DC | PRN

## 2021-02-24 SURGICAL SUPPLY — 53 items
ADH SKN CLS APL DERMABOND .7 (GAUZE/BANDAGES/DRESSINGS) ×2
APL PRP STRL LF DISP 70% ISPRP (MISCELLANEOUS) ×2
BAG BANDED W/RUBBER/TAPE 36X54 (MISCELLANEOUS) ×4 IMPLANT
BAG EQP BAND 135X91 W/RBR TAPE (MISCELLANEOUS) ×2
BALLN STERLING RX 6X30X80 (BALLOONS) ×3
BALLOON STERLING RX 6X30X80 (BALLOONS) IMPLANT
CANISTER SUCT 3000ML PPV (MISCELLANEOUS) ×4 IMPLANT
CHLORAPREP W/TINT 26 (MISCELLANEOUS) ×7 IMPLANT
CLIP LIGATING EXTRA MED SLVR (CLIP) ×3 IMPLANT
CLIP LIGATING EXTRA SM BLUE (MISCELLANEOUS) ×3 IMPLANT
COVER DOME SNAP 22 D (MISCELLANEOUS) ×4 IMPLANT
COVER PROBE W GEL 5X96 (DRAPES) ×4 IMPLANT
DERMABOND ADVANCED (GAUZE/BANDAGES/DRESSINGS) ×1
DERMABOND ADVANCED .7 DNX12 (GAUZE/BANDAGES/DRESSINGS) IMPLANT
DRAPE FEMORAL ANGIO 80X135IN (DRAPES) ×4 IMPLANT
ELECT REM PT RETURN 9FT ADLT (ELECTROSURGICAL) ×3
ELECTRODE REM PT RTRN 9FT ADLT (ELECTROSURGICAL) ×3 IMPLANT
GAUZE SPONGE 4X4 12PLY STRL (GAUZE/BANDAGES/DRESSINGS) ×4 IMPLANT
GLOVE SURG POLYISO LF SZ8 (GLOVE) ×4 IMPLANT
GOWN STRL REUS W/ TWL LRG LVL3 (GOWN DISPOSABLE) ×6 IMPLANT
GOWN STRL REUS W/ TWL XL LVL3 (GOWN DISPOSABLE) ×3 IMPLANT
GOWN STRL REUS W/TWL LRG LVL3 (GOWN DISPOSABLE) ×6
GOWN STRL REUS W/TWL XL LVL3 (GOWN DISPOSABLE) ×3
GUIDEWIRE ENROUTE 0.014 (WIRE) ×4 IMPLANT
INTRODUCER KIT GALT 7CM (INTRODUCER) ×3
KIT BASIN OR (CUSTOM PROCEDURE TRAY) ×4 IMPLANT
KIT ENCORE 26 ADVANTAGE (KITS) ×4 IMPLANT
KIT INTRODUCER GALT 7 (INTRODUCER) ×3 IMPLANT
KIT TURNOVER KIT B (KITS) ×4 IMPLANT
NDL HYPO 25GX1X1/2 BEV (NEEDLE) IMPLANT
NEEDLE HYPO 25GX1X1/2 BEV (NEEDLE) IMPLANT
NS IRRIG 1000ML POUR BTL (IV SOLUTION) ×1 IMPLANT
PACK CAROTID (CUSTOM PROCEDURE TRAY) ×4 IMPLANT
POSITIONER HEAD DONUT 9IN (MISCELLANEOUS) ×4 IMPLANT
SET MICROPUNCTURE 5F STIFF (MISCELLANEOUS) ×1 IMPLANT
SHEATH AVANTI 11CM 5FR (SHEATH) IMPLANT
SHUNT CAROTID BYPASS 10 (VASCULAR PRODUCTS) IMPLANT
STENT TRANSCAROTID SYS 10X40 (Permanent Stent) ×1 IMPLANT
SUT MNCRL AB 4-0 PS2 18 (SUTURE) ×4 IMPLANT
SUT PROLENE 5 0 C 1 24 (SUTURE) ×4 IMPLANT
SUT PROLENE 6 0 BV (SUTURE) IMPLANT
SUT SILK 2 0 SH (SUTURE) ×1 IMPLANT
SUT SILK 2 0 SH CR/8 (SUTURE) ×4 IMPLANT
SUT VIC AB 3-0 SH 27 (SUTURE) ×3
SUT VIC AB 3-0 SH 27X BRD (SUTURE) ×3 IMPLANT
SYR 10ML LL (SYRINGE) ×12 IMPLANT
SYR 20ML LL LF (SYRINGE) ×4 IMPLANT
SYR CONTROL 10ML LL (SYRINGE) IMPLANT
SYSTEM TRANSCAROTID NEUROPRTCT (MISCELLANEOUS) ×3 IMPLANT
TOWEL GREEN STERILE (TOWEL DISPOSABLE) ×4 IMPLANT
TRANSCAROTID NEUROPROTECT SYS (MISCELLANEOUS) ×3
WATER STERILE IRR 1000ML POUR (IV SOLUTION) ×4 IMPLANT
WIRE BENTSON .035X145CM (WIRE) ×4 IMPLANT

## 2021-02-24 NOTE — Anesthesia Procedure Notes (Signed)
Arterial Line Insertion Start/End1/18/2023 8:00 AM, 02/24/2021 8:10 AM Performed by: Annye Asa, MD, CRNA  Lidocaine 1% used for infiltration Right was placed Catheter size: 20 G Hand hygiene performed   Attempts: 1 Procedure performed without using ultrasound guided technique. Following insertion, Biopatch and dressing applied. Patient tolerated the procedure well with no immediate complications.

## 2021-02-24 NOTE — Progress Notes (Signed)
[facility]  Subjective  -  Doing well new complaints.   Objective (!) 92/45 (!) 55 97.7 F (36.5 C) (Oral) 16 90%  Intake/Output Summary (Last 24 hours) at 02/24/2021 1749 Last data filed at 02/24/2021 1619 Gross per 24 hour  Intake 1219.04 ml  Output 50 ml  Net 1169.04 ml    A & O x 3 Right subclavian incision with ecchymosis, without hematoma Moving all ext.  Grip 5/5 equal B   No facial droop or tongue deviation  Assessment/Planning: S/P right TCAR for asymptomatic carotid stenosis  Stable post op disposition without neurologic deficits Plan for future angiogram in regards to foot ulcer once he has recovered from [place] surgery.    Roxy Horseman 02/24/2021 5:49 PM --  Laboratory Lab Results: Recent Labs    02/22/21 1034  WBC 8.2  HGB 12.6*  HCT 42.2  PLT 290   BMET Recent Labs    02/22/21 1034  NA 143  K 4.4  CL 104  CO2 29  GLUCOSE 112*  BUN 22  CREATININE 1.42*  CALCIUM 9.1    COAG Lab Results  Component Value Date   INR 1.0 02/22/2021   INR 1.2 12/14/2018   INR 1.4 (H) 11/08/2018   No results found for: PTT

## 2021-02-24 NOTE — Anesthesia Postprocedure Evaluation (Signed)
Anesthesia Post Note  Patient: William Kirk  Procedure(s) Performed: RIGHT TRANSCAROTID ARTERY REVASCULARIZATION (Right: Neck) ULTRASOUND GUIDANCE FOR VASCULAR ACCESS (Left: Groin)     Patient location during evaluation: PACU Anesthesia Type: General Level of consciousness: awake and alert, patient cooperative and oriented Pain management: pain level controlled Vital Signs Assessment: post-procedure vital signs reviewed and stable Respiratory status: spontaneous breathing, nonlabored ventilation and respiratory function stable Cardiovascular status: blood pressure returned to baseline and stable Postop Assessment: no apparent nausea or vomiting Anesthetic complications: no   No notable events documented.  Last Vitals:  Vitals:   02/24/21 1200 02/24/21 1215  BP: (!) 109/45 (!) 105/44  Pulse: 61 60  Resp: 18 16  Temp:    SpO2: 94% 92%    Last Pain:  Vitals:   02/24/21 1215  TempSrc:   PainSc: 0-No pain                 Harrol Novello,E. Lizmarie Witters

## 2021-02-24 NOTE — Op Note (Signed)
DATE OF SERVICE: 02/24/2021  PATIENT:  William Kirk  81 y.o. male  PRE-OPERATIVE DIAGNOSIS:  asymptomatic critical right carotid artery stenosis  POST-OPERATIVE DIAGNOSIS:  Same  PROCEDURE:   Right transcarotid artery revascular (TCAR)  SURGEON:  Surgeon(s) and Role:    * Cherre Robins, MD - Primary  ASSISTANT: Kateri Plummer, PA-C  An assistant was required to facilitate exposure and expedite the case.  ANESTHESIA:   general  EBL: minimal  BLOOD ADMINISTERED:none  DRAINS: none   LOCAL MEDICATIONS USED:  NONE  SPECIMEN:  none  COUNTS: confirmed correct.  TOURNIQUET:  none  PATIENT DISPOSITION:  PACU - hemodynamically stable.   Delay start of Pharmacological VTE agent (>24hrs) due to surgical blood loss or risk of bleeding: no  INDICATION FOR PROCEDURE: William Kirk is a 81 y.o. male with critical right carotid artery stenosis which is asymptomatic. He has ongoing difficulty with dysphagia, and so I felt a TCAR would be best for him to limit his risk of cranial nerve injury. After careful discussion of risks, benefits, and alternatives the patient was offered R TCAR. We specifically discussed risk of stroke, cranial nerve injury, and hematoma. The patient understood and wished to proceed.  OPERATIVE FINDINGS: Unremarkable carotid exposure. Vagus nervie identified and protected. 9 minutes of flow reversal time. Good technical result from TCAR. 6x3mm predilation balloon; 10x17mm stent. Patient awoke neurologically intact.   DESCRIPTION OF PROCEDURE: After identification of the patient in the pre-operative holding area, the patient was transferred to the operating room. The patient was positioned supine on the operating room table. Anesthesia was induced. The neck and groins were prepped and draped in standard fashion. A surgical pause was performed confirming correct patient, procedure, and operative location.  Using intraoperative ultrasound the course of the right  common carotid artery was mapped on the skin.  A transverse incision was made between the sternal and clavicular heads of the sternocleidomastoid muscle, below the omohyoid. Following longitudinal division of the carotid sheath the jugular vein was partially skeletonized and retracted medially. Once 3 cm of common carotid artery (CCA) were isolated, umbilical tape was placed around the proximal 1/3 of the CCA under direct vision. A 5-O Prolene suture was pre-placed in the anterior wall of the CCA, in a U stitch configuration, close to the clavicle to facilitate hemostasis upon removal of the arterial sheath at completion of the TCAR procedure.   The contralateral common femoral vein (CFV) was accessed under ultrasound guidance, using standard Seldinger and micropuncture access technique. The venous return sheath was advanced into the CFV over the 0.035 wire provided. Blood was aspirated from the flow line followed by flushing of the Venous Sheath with heparinized saline. The Venous Sheath was secured to the patients skin with suture to maintain  optimal position in the vessel. Heparin was given to obtain a therapeutic activated clotting time >250 seconds prior to arterial access.   A 4-French non-stiffened ENHANCE Transcarotid / Peripheral Access set was used, puncturing the artery with the 21G needle through the pre-placed U stitch while holding gentle traction on the umbilical tape to stabilize and centralize the CCA within the incision. Careful attention was paid to the change in CCA shape when using the umbilical tape to control or lift the artery. The micropuncture wire was then advanced 3-4 cm into the CCA and, the 21G needle was removed. The micropuncture sheath was advanced 2-3 cm into the CCA and the wire and dilator were removed. Pulsatile backflow indicated correct  positioning. The provided 0.035" J-tipped guidewire was inserted as close as possible to the bifurcation without engaging the  lesion. After micropuncture sheath removal, the Transcarotid Arterial Sheath was advanced to the 2.5cm marker and the 0.035 wire and dilator were then removed. Arterial Sheath position was assessed under fluoroscopy in two projections to ensure that the sheath tip was oriented coaxially in the CCA. The Arterial Sheath was sutured to the patient with gentle forward tension. Blood was slowly aspirated followed by flushing with heparinized saline. No ingress of air bubbles through the passive hemostatic valve was observed. The stopcocks were closed. Traction applied to the CCA previously to facilitate access was gently released.  The Flow Controller was connected to the Transcarotid Arterial Sheath, prepared by passively allowing a column of arterial blood to fill the line and connected to the Venous Return Sheath. CCA inflow was occluded proximal to the arteriotomy with a vascular clamp to achieve active flow reversal. To confirm flow reversal, a saline bolus was delivered into the venous flow line on both High and Low flow settings of the Flow Controller. Angiograms were performed with slow injections of a small amount of contrast filling just past the lesion to minimize antegrade transmission of micro-bubbles.  Prior to lesion manipulation, heart rate (56LSL) and systolic BP (373-428JGOT) were managed upwards to optimize flow reversal and procedural neuroprotection. The lesion was crossed with an 0.014 ENROUTE guidewire and pre-dilation of the lesion was performed with a 6x92mm rapid exchange 0.014 compatible balloon catheter to 8 atmospheres for 10 seconds. Stenting was performed with an 10x3mm ENROUTE Transcarotid stent, sized appropriately to the right CCA.  A total of 9 minutes of flow reversal was used.  AP and lateral angiograms (gentle contrast injections) were performed to confirm stent placement and arterial wall stent apposition. At [facility] case completion, antegrade flow was restored by  releasing the clamp on the CCA then closing the NPS stopcocks to the flow lines. The Transcarotid Arterial Sheath was removed and the pre-closure suture was tied. Heparin reversal was employed and a drain was placed. The Venous Return Sheath was removed and hemostasis was achieved with brief manual compression.   Upon completion of the case instrument and sharps counts were confirmed correct. The patient was transferred to the PACU in good condition. I was present for all portions of the procedure.  Yevonne Aline. Stanford Breed, MD [facility] Phone Number: [phone] 02/24/2021 10:05 AM

## 2021-02-24 NOTE — H&P (Signed)
[facility] [place]  ASSESSMENT / PLAN: William Kirk is a 81 y.o. 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  Patient counseled patients with asymptomatic peripheral arterial disease or claudication have a 1-2% risk of developing chronic limb threatening ischemia, but a 15-30% risk of mortality in the next 5 years. Intervention should only be considered for medically optimized patients with disabling symptoms. .  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 Now at threshold to consider prophylactic repair.  The patient should continue best medical therapy for atherosclerosis including: Complete cessation from all tobacco products. Blood glucose control with goal A1c < 7%. Blood pressure control with goal blood pressure < 140/90 mmHg. Lipid reduction therapy with goal LDL-C <100 mg/dL (<70 if symptomatic from PAD).  Aspirin 81mg  PO QD.  Atorvastatin 40-80mg  PO QD (or other "high intensity" statin therapy). Adequate hydration (at least 2 liters / day) if patient's heart and kidney function is adequate. Daily walking to and past the point of discomfort. Patient counseled to keep a log of exercise distance.  Will check CT angiogram. Given recent dysphagia and aspiration events, a TCAR would be best for him.   CHIEF COMPLAINT: intermittent claudication  HISTORY OF PRESENT ILLNESS: William Kirk is a 81 y.o. male referred to clinic for evaluation of left lower extremity intermittent claudication.  He is a [place] patient and has undergone ABI and carotid artery duplex with his care team at the [place].  He reports he can walk about a half block before typical calf cramping begins.  This improves with rest.  He specifically denies symptoms typical of ischemic rest pain.  He has no ulcerations about his foot.  He is able to function with his claudication.  He was able to mow part of his yard this past  weekend.  He has previously undergone percutaneous intervention in his right lower extremity but was not able to give much detail.  12/22/20: Patient returns to clinic for surveillance.  He reports his claudication symptoms are about the same.  He is able to walk, but is bothered somewhat by claudication.  He is not quite at a disabling level yet.  We reviewed his carotid duplex in detail today, and I counseled him extensively about its findings.  We reviewed the natural history of carotid artery stenosis, and the rationale for prophylactic, asymptomatic intervention.  Past Medical History:  Diagnosis Date   Anxiety    Arthritis    "hands" (09/21/2016)   Bleeding stomach ulcer 2000s   COPD (chronic obstructive pulmonary disease) (HCC)    Cough    Depression    Dyspnea    Hypertension    Pneumonia    "now and several times before this" (09/21/2016)   PVD (peripheral vascular disease) ([place]) 2014   prev PTCA on RLE    Past Surgical History:  Procedure Laterality Date   IR ANGIOGRAM SELECTIVE EACH ADDITIONAL VESSEL  07/26/2018   IR ANGIOGRAM SELECTIVE EACH ADDITIONAL VESSEL  07/26/2018   IR ANGIOGRAM SELECTIVE EACH ADDITIONAL VESSEL  07/26/2018   IR EMBO ART  VEN HEMORR LYMPH EXTRAV  INC GUIDE ROADMAPPING  07/26/2018   IR GASTROSTOMY TUBE MOD SED  12/14/2018   IR US GUIDE VASC ACCESS RIGHT  07/26/2018   JOINT REPLACEMENT     KNEE ARTHROSCOPY Right X 1   TOTAL HIP ARTHROPLASTY Right 2009   VIDEO ASSISTED THORACOSCOPY (VATS)/ LOBECTOMY Right 10/18/2018   Procedure: VIDEO  ASSISTED THORACOSCOPY (VATS)/ right lower LOBECTOMY;  Surgeon: Melrose Nakayama, MD;  Location: [facility];  Service: Thoracic;  Laterality: Right;   VIDEO ASSISTED THORACOSCOPY (VATS)/DECORTICATION Right 11/09/2018   Procedure: REDO VIDEO ASSISTED THORACOSCOPY (VATS)/DECORTICATION/DRAIN EFFUSION;  Surgeon: Melrose Nakayama, MD;  Location: [facility];  Service: Thoracic;  Laterality: Right;   VIDEO BRONCHOSCOPY N/A 07/27/2018    Procedure: VIDEO BRONCHOSCOPY WITHOUT FLUORO;  Surgeon: Collene Gobble, MD;  Location: [facility];  Service: Cardiopulmonary;  Laterality: N/A;   VIDEO BRONCHOSCOPY N/A 11/09/2018   Procedure: VIDEO BRONCHOSCOPY;  Surgeon: Melrose Nakayama, MD;  Location: [facility] OR;  Service: Thoracic;  Laterality: N/A;    Family History  Problem Relation Age of Onset   Cancer Father 37       Brain   Dementia Mother     Social History   Socioeconomic History   Marital status: Married    Spouse name: Not on file   Number of children: Not on file   Years of education: 16   Highest education level: Not on file  Occupational History   Occupation: Event organiser  Tobacco Use   Smoking status: Former    Packs/day: 1.00    Years: 55.00    Pack years: 55.00    Types: Cigarettes    Quit date: 04/2015    Years since quitting: 5.8   Smokeless tobacco: Never  Vaping Use   Vaping Use: Never used  Substance and Sexual Activity   Alcohol use: No    Alcohol/week: 0.0 standard drinks   Drug use: No   Sexual activity: Not Currently    Partners: Female  Other Topics Concern   Not on file  Social History Narrative   Not on file   Social Determinants of Health   Financial Resource Strain: Not on file  Food Insecurity: Not on file  Transportation Needs: Not on file  Physical Activity: Not on file  Stress: Not on file  Social Connections: Not on file  Intimate Partner Violence: Not on file    Allergies  Allergen Reactions   Testosterone Rash    Current Facility-Administered Medications  Medication Dose Route Frequency Provider Last Rate Last Admin   0.9 %  sodium chloride infusion   Intravenous Continuous Cherre Robins, MD       ceFAZolin (ANCEF) IVPB 2g/100 mL premix  2 g Intravenous 30 min Pre-Op Cherre Robins, MD       Chlorhexidine Gluconate Cloth 2 % PADS 6 each  6 each Topical Once Cherre Robins, MD       And   Chlorhexidine Gluconate Cloth 2 % PADS 6 each  6 each Topical Once  Cherre Robins, MD       lactated ringers infusion   Intravenous Continuous Annye Asa, MD       remifentanil (ULTIVA) 1 mg in 50 mL normal saline (20 mcg/ml) Optime  0.15 mcg/kg/min (Ideal) Intravenous To OR Georgia Duff, CRNA        REVIEW OF SYSTEMS:  [X]  denotes positive finding, [ ]  denotes negative finding Cardiac  Comments:  Chest pain or chest pressure:    Shortness of breath upon exertion:    Short of breath when lying flat:    Irregular heart rhythm:        Vascular    Pain in calf, thigh, or hip brought on by ambulation:    Pain in feet at night that wakes you up from your sleep:  Blood clot in your veins:    Leg swelling:         Pulmonary    Oxygen at home:    Productive cough:     Wheezing:         Neurologic    Sudden weakness in arms or legs:     Sudden numbness in arms or legs:     Sudden onset of difficulty speaking or slurred speech:    Temporary loss of vision in one eye:     Problems with dizziness:         Gastrointestinal    Blood in stool:     Vomited blood:         Genitourinary    Burning when urinating:     Blood in urine:        Psychiatric    Major depression:         Hematologic    Bleeding problems:    Problems with blood clotting too easily:        Skin    Rashes or ulcers:        Constitutional    Fever or chills:      PHYSICAL EXAM  Vitals:   02/24/21 0646  BP: (!) 140/45  Pulse: 64  Resp: 17  Temp: 98.3 F (36.8 C)  TempSrc: Oral  SpO2: 95%  Weight: 72.6 kg  Height: 5\' 7"  (1.702 m)     Constitutional: well appearing. no distress. Appears well nourished.  Neurologic: CN intact. no focal findings. no sensory loss. Psychiatric: Mood and affect symmetric and appropriate. Eyes: No icterus. No conjunctival pallor. Ears, nose, throat: mucous membranes moist. Midline trachea.  Cardiac: regular rate and rhythm.  Respiratory: unlabored. Abdominal: soft, non-tender, non-distended.  Peripheral  vascular:  No palpable pedal pulses.  Feet are warm. Extremity: No edema. No cyanosis. No pallor.  Skin: No gangrene. No ulceration.  Lymphatic: No Stemmer's sign. No palpable lymphadenopathy.  PERTINENT LABORATORY AND RADIOLOGIC DATA  Most recent CBC CBC Latest Ref Rng & Units 02/22/2021 12/14/2018 11/20/2018  WBC 4.0 - 10.5 K/uL 8.2 11.1(H) 10.4  Hemoglobin 13.0 - 17.0 g/dL 12.6(L) 11.3(L) 11.5(L)  Hematocrit 39.0 - 52.0 % 42.2 37.3(L) 36.2(L)  Platelets 150 - 400 K/uL 290 301 350     Most recent CMP CMP Latest Ref Rng & Units 02/22/2021 01/05/2021 11/20/2018  Glucose 70 - 99 mg/dL 112(H) - 105(H)  BUN 8 - 23 mg/dL 22 - 31(H)  Creatinine 0.61 - 1.24 mg/dL 1.42(H) 1.50(H) 0.71  Sodium 135 - 145 mmol/L 143 - 137  Potassium 3.5 - 5.1 mmol/L 4.4 - 4.7  Chloride 98 - 111 mmol/L 104 - 102  CO2 22 - 32 mmol/L 29 - 28  Calcium 8.9 - 10.3 mg/dL 9.1 - 8.5(L)  Total Protein 6.5 - 8.1 g/dL 7.7 - 6.6  Total Bilirubin 0.3 - 1.2 mg/dL 0.5 - 0.6  Alkaline Phos 38 - 126 U/L 111 - 107  AST 15 - 41 U/L 24 - 25  ALT 0 - 44 U/L 18 - 23   Vascular Imaging:  +-------+-----------+-----------+------------+------------+   ABI/TBI Today's ABI Today's TBI Previous ABI Previous TBI   +-------+-----------+-----------+------------+------------+   Right   0.76        0.46        0.63         0.44           +-------+-----------+-----------+------------+------------+   Left    0.58  0.33        0.33         0.28           +-------+-----------+-----------+------------+------------+  Carotid Duplex Right Carotid: Velocities in the right ICA are consistent with a 80-99%  stenosis                 in the bifurcation. Calcific plaque may obscure higher  velocity .                 The proximal ECA not visualized, wavefom dampened on  distally.   Left Carotid: Velocities in the left ICA are consistent with a 1-39%  stenosis.   Vertebrals:  Bilateral vertebral arteries demonstrate antegrade flow.   Subclavians: Normal flow hemodynamics were seen in bilateral subclavian               arteries.   Yevonne Aline. Stanford Breed, MD [facility] Phone Number: [phone] 02/24/2021 8:18 AM

## 2021-02-24 NOTE — Progress Notes (Signed)
°  Day of Surgery Note    Subjective:  no complaints; resting comfortably   Vitals:   02/24/21 0646  BP: (!) 140/45  Pulse: 64  Resp: 17  Temp: 98.3 F (36.8 C)  SpO2: 95%    Incisions:   clean and dry without hematoma.  Some ecchymosis.  Left groin looks fine without hematoma Neuro  moving all extremities equally; tongue is midline Lungs:  non labored   Assessment/Plan:  This is a 81 y.o. male who is s/p  Right TCAR  -neuro in tact -pt requiring neo for BP support.  Currently, systolic is 847'J.  -IVF at 75cc/hr given COPD -to [place] later today.  Anticipate dc tomorrow if no issues overnight. -asa/plavix/statin continued. -will need angiogram in the future for foot ulcer   Leontine Locket, PA-C 02/24/2021 10:47 AM [phone]

## 2021-02-24 NOTE — Anesthesia Procedure Notes (Signed)
Procedure Name: Intubation Date/Time: 02/24/2021 8:51 AM Performed by: Georgia Duff, CRNA Pre-anesthesia Checklist: Patient identified, Emergency Drugs available, Suction available and Patient being monitored Patient Re-evaluated:Patient Re-evaluated prior to induction Oxygen Delivery Method: Circle System Utilized Preoxygenation: Pre-oxygenation with 100% oxygen Induction Type: IV induction Ventilation: Mask ventilation without difficulty Laryngoscope Size: Mac and 4 Grade View: Grade I Tube type: Oral Tube size: 7.5 mm Number of attempts: 1 Airway Equipment and Method: Stylet and Oral airway Placement Confirmation: ETT inserted through vocal cords under direct vision, positive ETCO2 and breath sounds checked- equal and bilateral Secured at: 22 cm Tube secured with: Tape Dental Injury: Teeth and Oropharynx as per pre-operative assessment  Comments: Placed by Lucita Ferrara, SRNA under supervision of CRNA and MDA

## 2021-02-24 NOTE — Addendum Note (Signed)
Addendum  created 02/24/21 1241 by Lucita Ferrara, RN   Intraprocedure Event edited

## 2021-02-24 NOTE — Transfer of Care (Signed)
Immediate Anesthesia Transfer of Care Note  Patient: William Kirk  Procedure(s) Performed: RIGHT TRANSCAROTID ARTERY REVASCULARIZATION (Right: Neck) ULTRASOUND GUIDANCE FOR VASCULAR ACCESS (Left: Groin)  Patient Location: PACU  Anesthesia Type:General  Level of Consciousness: patient cooperative  Airway & Oxygen Therapy: Patient Spontanous Breathing and Patient connected to nasal cannula oxygen  Post-op Assessment: Report given to RN and Post -op Vital signs reviewed and stable  Post vital signs: Reviewed and stable  Last Vitals:  Vitals Value Taken Time  BP 95/50 02/24/21 1036  Temp    Pulse 67 02/24/21 1041  Resp 18 02/24/21 1041  SpO2 92 % 02/24/21 1041  Vitals shown include unvalidated device data.  Last Pain:  Vitals:   02/24/21 0703  TempSrc:   PainSc: 10-Worst pain ever      Patients Stated Pain Goal: 5 (23/76/28 3151)  Complications: No notable events documented.

## 2021-02-24 NOTE — Progress Notes (Signed)
Called in to pt's room by NT stating pt had pulled his iv off. When entered the room, pt had pulled his A-line and his peripheral line out from his right forearm. Noted some blood in his gown and bed not much. Cathter tip intact for both A-line and PIV. Noted hematoma on right radial A-line site ,size of a quarter. Pressure applied for 10-15 min. Hematoma was about disappeared, Charge RN aware and was by bedside. Radial pulse 2+, o2 saturation on right finger 96% in 2l 02. Pressure dressing applied to right radial site. Pt denies any pain, tingling sensation on right hand. Pleasantly confused. Mittens applied. Instructed to leave wires and line alone. Pt voiced understanding. Bed alarm on. Will continue to monitor.

## 2021-02-24 NOTE — Discharge Instructions (Signed)
   [facility] [place]  Discharge Instructions   Carotid Surgery  Please refer to the following instructions for your post-procedure care. Your surgeon or physician assistant will discuss any changes with you.  Activity  You are encouraged to walk as much as you can. You can slowly return to normal activities but must avoid strenuous activity and heavy lifting until your doctor tell you it's okay. Avoid activities such as vacuuming or swinging a golf club. You can drive after one week if you are comfortable and you are no longer taking prescription pain medications. It is normal to feel tired for serval weeks after your surgery. It is also normal to have difficulty with sleep habits, eating, and bowel movements after surgery. These will go away with time.  Bathing/Showering  Shower daily after you go home. Do not soak in a bathtub, hot tub, or swim until the incision heals completely.  Incision Care  Shower every day. Clean your incision with mild soap and water. Pat the area dry with a clean towel. You do not need a bandage unless otherwise instructed. Do not apply any ointments or creams to your incision. You may have skin glue on your incision. Do not peel it off. It will come off on its own in about one week. Your incision may feel thickened and raised for several weeks after your surgery. This is normal and the skin will soften over time.   For Men Only: It's okay to shave around the incision but do not shave the incision itself for 2 weeks. It is common to have numbness under your chin that could last for several months.  Diet  Resume your normal diet. There are no special food restrictions following this procedure. A low fat/low cholesterol diet is recommended for all patients with vascular disease. In order to heal from your surgery, it is CRITICAL to get adequate nutrition. Your body requires vitamins, minerals, and protein. Vegetables are the best source of  vitamins and minerals. Vegetables also provide the perfect balance of protein. Processed food has little nutritional value, so try to avoid this.  Medications  Resume taking all of your medications unless your doctor or physician assistant tells you not to. If your incision is causing pain, you may take over-the- counter pain relievers such as acetaminophen (Tylenol). If you were prescribed a stronger pain medication, please be aware these medications can cause nausea and constipation. Prevent nausea by taking the medication with a snack or meal. Avoid constipation by drinking plenty of fluids and eating foods with a high amount of fiber, such as fruits, vegetables, and grains.   Do not take Tylenol if you are taking prescription pain medications.  Follow Up  Our office will schedule a follow up appointment 2-3 weeks following discharge.  Please call us immediately for any of the following conditions  . Increased pain, redness, drainage (pus) from your incision site. . Fever of 101 degrees or higher. . If you should develop stroke (slurred speech, difficulty swallowing, weakness on one side of your body, loss of vision) you should call 911 and go to the nearest emergency room. .  Reduce your risk of vascular disease:  . Stop smoking. If you would like help call [facility] at 1-800-QUIT-NOW (1-800-784-8669) or [place] at 336-586-4000. . Manage your cholesterol . Maintain a desired weight . Control your diabetes . Keep your blood pressure down .  If you have any questions, please call the office at [phone]. 

## 2021-02-25 ENCOUNTER — Other Ambulatory Visit: Payer: Self-pay

## 2021-02-25 ENCOUNTER — Encounter (HOSPITAL_COMMUNITY): Payer: Self-pay | Admitting: Vascular Surgery

## 2021-02-25 ENCOUNTER — Other Ambulatory Visit (HOSPITAL_COMMUNITY): Payer: Self-pay

## 2021-02-25 LAB — LIPID PANEL
Cholesterol: 140 mg/dL (ref 0–200)
HDL: 36 mg/dL — ABNORMAL LOW (ref >40)
LDL Cholesterol: 89 mg/dL (ref 0–99)
Total CHOL/HDL Ratio: 3.9 RATIO
Triglycerides: 73 mg/dL (ref <150)
VLDL: 15 mg/dL (ref 0–40)

## 2021-02-25 LAB — CBC
HCT: 32.2 % — ABNORMAL LOW (ref 39.0–52.0)
Hemoglobin: 10 g/dL — ABNORMAL LOW (ref 13.0–17.0)
MCH: 27.9 pg (ref 26.0–34.0)
MCHC: 31.1 g/dL (ref 30.0–36.0)
MCV: 89.7 fL (ref 80.0–100.0)
Platelets: 246 10*3/uL (ref 150–400)
RBC: 3.59 MIL/uL — ABNORMAL LOW (ref 4.22–5.81)
RDW: 16.1 % — ABNORMAL HIGH (ref 11.5–15.5)
WBC: 10.8 10*3/uL — ABNORMAL HIGH (ref 4.0–10.5)
nRBC: 0 % (ref 0.0–0.2)

## 2021-02-25 LAB — BASIC METABOLIC PANEL
Anion gap: 4 — ABNORMAL LOW (ref 5–15)
BUN: 26 mg/dL — ABNORMAL HIGH (ref 8–23)
CO2: 26 mmol/L (ref 22–32)
Calcium: 7.9 mg/dL — ABNORMAL LOW (ref 8.9–10.3)
Chloride: 107 mmol/L (ref 98–111)
Creatinine, Ser: 1.43 mg/dL — ABNORMAL HIGH (ref 0.61–1.24)
GFR, Estimated: 50 mL/min — ABNORMAL LOW (ref >60)
Glucose, Bld: 132 mg/dL — ABNORMAL HIGH (ref 70–99)
Potassium: 5.2 mmol/L — ABNORMAL HIGH (ref 3.5–5.1)
Sodium: 137 mmol/L (ref 135–145)

## 2021-02-25 MED ORDER — ATORVASTATIN CALCIUM 80 MG PO TABS
80.0000 mg | ORAL_TABLET | Freq: Every day | ORAL | 1 refills | Status: AC
  Filled 2021-02-25: qty 30, 30d supply, fill #0

## 2021-02-25 MED ORDER — ATORVASTATIN CALCIUM 80 MG PO TABS
80.0000 mg | ORAL_TABLET | Freq: Every day | ORAL | 3 refills | Status: DC
  Filled 2021-02-25 – 2021-03-21 (×3): qty 90, 90d supply, fill #0

## 2021-02-25 MED ORDER — TRAMADOL HCL 50 MG PO TABS
50.0000 mg | ORAL_TABLET | Freq: Four times a day (QID) | ORAL | 0 refills | Status: DC | PRN
  Filled 2021-02-25: qty 20, 5d supply, fill #0

## 2021-02-25 MED ORDER — ATORVASTATIN CALCIUM 80 MG PO TABS: 80.0000 mg | ORAL_TABLET | Freq: Every day | ORAL | Status: DC

## 2021-02-25 NOTE — Progress Notes (Signed)
[facility] [place]  Subjective  - He is doing well, confused at baseline.   Objective (!) 102/51 70 98.4 F (36.9 C) (Oral) 20 90%  Intake/Output Summary (Last 24 hours) at 02/25/2021 1133 Last data filed at 02/25/2021 0417 Gross per 24 hour  Intake 1367.36 ml  Output 700 ml  Net 667.36 ml    Right subclavian incision healing without hematoma Independent ambulation in room No neurologic deficits, no facial droop or tongue deviation  Assessment/Planning: POD #1 right TCAR for asymptomatic  Stable post op disposition without neurologic deficits He will f/u in our office in 4 weeks with Duplex Plan for Angiogram 03/05/21 with Dr. Stanford Breed for PAD with symptoms of claudication.    William Kirk 02/25/2021 11:33 AM --  Laboratory Lab Results: Recent Labs    02/25/21 0206  WBC 10.8*  HGB 10.0*  HCT 32.2*  PLT 246   BMET Recent Labs    02/25/21 0206  NA 137  K 5.2*  CL 107  CO2 26  GLUCOSE 132*  BUN 26*  CREATININE 1.43*  CALCIUM 7.9*    COAG Lab Results  Component Value Date   INR 1.0 02/22/2021   INR 1.2 12/14/2018   INR 1.4 (H) 11/08/2018   No results found for: PTT

## 2021-02-25 NOTE — Discharge Summary (Signed)
[facility]ists Discharge Summary   Patient ID:  William Kirk MRN: [id number] DOB/AGE: November 07, 1940 81 y.o.  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 Lenell Antu for PAD with intermittent claudication on the left LE and asymptomatic carotid stenosis .  He has been followed in the past by the [place].   He denies rest pain and non healing ulcers.  His carotid duplex revealed right ICA with 80-99% stenosis.  The left is < 39% stenosis.  Based on exam and discussion with DR. Lenell Antu he has ongoing difficulty with dysphagia, and so I felt a TCAR would be best for him to limit his risk of cranial nerve injury.   Hospital Course:  William Kirk is a 81 y.o. male is S/P right  Procedure(s): RIGHT TRANSCAROTID ARTERY REVASCULARIZATION ULTRASOUND GUIDANCE FOR VASCULAR ACCESS Stable post op disposition without neurologic deficits Right TCAR incision without hematoma, + ecchymosis, no tongue deviation or facial droop.  He is ambulatory with baseline intermittent claudication.  He will f/u with DR. Hawken in the future to discuss angiogram with LE runoff and possible intervention  on 03/05/21.  He will continue ASA/Plavix/Statin.  Patient stable for discharge 02/25/21.  He was seen today in conduction with Dr. Lenell Antu.   Significant Diagnostic Studies: CBC Lab Results  Component Value Date   WBC 10.8 (H) 02/25/2021   HGB 10.0 (L) 02/25/2021   HCT 32.2 (L) 02/25/2021   MCV 89.7 02/25/2021   PLT 246 02/25/2021    BMET    Component Value Date/Time   NA 137 02/25/2021 0206   K 5.2 (H) 02/25/2021 0206   CL 107 02/25/2021 0206   CO2 26 02/25/2021 0206   GLUCOSE 132 (H) 02/25/2021 0206   BUN 26 (H) 02/25/2021 0206   CREATININE 1.43 (H) 02/25/2021 0206   CALCIUM 7.9 (L) 02/25/2021 0206   GFRNONAA 50 (L) 02/25/2021 0206   GFRAA >60 11/20/2018 0343   COAG Lab Results  Component Value Date   INR 1.0 02/22/2021   INR 1.2 12/14/2018   INR 1.4 (H) 11/08/2018     Disposition:  Discharge to :Home Discharge Instructions     Call MD for:  redness, tenderness, or signs of infection (pain, swelling, bleeding, redness, odor or green/yellow discharge around incision site)   Complete by: As directed    Call MD for:  severe or increased pain, loss or decreased feeling  in affected limb(s)   Complete by: As directed    Call MD for:  temperature >100.5   Complete by: As directed    Resume previous diet   Complete by: As directed       Allergies as of 02/25/2021  Reactions   Testosterone Rash        Medication List     TAKE these medications    acetaminophen 500 MG tablet Commonly known as: TYLENOL Take 500 mg by mouth every 8 (eight) hours as needed for moderate pain.   albuterol (2.5 MG/3ML) 0.083% nebulizer solution Commonly known as: PROVENTIL Take 2.5 mg by nebulization every 6 (six) hours as needed for wheezing or shortness of breath.   albuterol 108 (90 Base) MCG/ACT inhaler Commonly known as: VENTOLIN HFA Inhale 2 puffs into the lungs every 4 (four) hours as needed for wheezing or shortness of breath.   aspirin EC 81 MG tablet Take  81 mg by mouth daily. Swallow whole.   atorvastatin 80 MG tablet Commonly known as: Lipitor Take 1 tablet (80 mg total) by mouth daily. What changed: You were already taking a medication with the same name, and this prescription was added. Make sure you understand how and when to take each.   atorvastatin 80 MG tablet Commonly known as: LIPITOR Take 1 tablet (80 mg total) by mouth at bedtime. What changed:  medication strength how much to take how to take this   bacitracin-polymyxin b ointment Commonly known as: POLYSPORIN Apply 1 application topically 2 (two) times daily.   benzonatate 200 MG capsule Commonly known as: TESSALON Take 1 capsule (200 mg total) by mouth 3 (three) times daily as needed for cough.   clopidogrel 75 MG tablet Commonly known as: PLAVIX Take 1 tablet (75 mg total) by mouth daily.   diclofenac 50 MG EC tablet Commonly known as: VOLTAREN Take 50 mg by mouth 2 (two) times daily as needed for moderate pain.   diclofenac sodium 1 % Gel Commonly known as: VOLTAREN Apply 2 g topically 4 (four) times daily as needed (pain).   ferrous sulfate 325 (65 FE) MG tablet Take 325 mg by mouth daily with breakfast.   fluticasone-salmeterol 250-50 MCG/ACT Aepb Commonly known as: ADVAIR Inhale 1 puff into the lungs in the morning and at bedtime.   gabapentin 800 MG tablet Commonly known as: Neurontin Take 1 tablet (800 mg total) by mouth 3 (three) times daily.   lidocaine 5 % Commonly known as: LIDODERM Place 1 patch onto the skin daily as needed (pain).   methocarbamol 500 MG tablet Commonly known as: Robaxin Take 1 tablet (500 mg total) by mouth 2 (two) times daily as needed for muscle spasms.   metoprolol tartrate 25 MG tablet Commonly known as: LOPRESSOR Take 12.5 mg by mouth daily.   multivitamin with minerals tablet Take 1 tablet by mouth daily.   pyridoxine 100 MG tablet Commonly known as: B-6 Take 100 mg by mouth daily.   sertraline 100 MG  tablet Commonly known as: ZOLOFT Place 2 tablets (200 mg total) into feeding tube daily. What changed: how to take this   Spiriva Respimat 2.5 MCG/ACT Aers Generic drug: Tiotropium Bromide Monohydrate Inhale 2 puffs into the lungs every morning.   Systane Complete 0.6 % Soln Generic drug: Propylene Glycol Place 1 drop into both eyes daily as needed (dry eyes).   traMADol 50 MG tablet Commonly known as: Ultram Take 1 tablet (50 mg total) by mouth every 6 (six) hours as needed.   traZODone 50 MG tablet Commonly known as: DESYREL Place 1 tablet (50 mg total) into feeding tube at bedtime. What changed: how to take this   Vitamin D 50 MCG (2000 UT) tablet Take 2,000 Units by mouth daily.  Verbal and written Discharge instructions given to the patient. Wound care per Discharge AVS  Follow-up Information     Leonie Douglas, MD Follow up in 4 week(s).   Specialties: Vascular Surgery, Interventional Cardiology Why: Office will call you to arrange your appt (sent) Contact information: [address] [place] [address] [phone]                 Signed: Mosetta Pigeon 02/25/2021, 1:12 PM --- For VQI Registry use --- Instructions: Press F2 to tab through selections.  Delete question if not applicable.   Modified Rankin score at D/C (0-6): Rankin Score=0  IV medication needed for:  1. Hypertension: No 2. Hypotension: No  Post-op Complications: No  1. Post-op CVA or TIA: No  If yes: Event classification (right eye, left eye, right cortical, left cortical, verterobasilar, other):   If yes: Timing of event (intra-op, <6 hrs post-op, >=6 hrs post-op, unknown):   2. CN injury: No  If yes: CN  injuried   3. Myocardial infarction: No  If yes: Dx by (EKG or clinical, Troponin):   4.  CHF: No  5.  Dysrhythmia (new): No  6. Wound infection: No  7. Reperfusion symptoms: No  8. Return to OR: No  If yes: return to OR for (bleeding,  neurologic, other CEA incision, other):   Discharge medications: Statin use:  Yes ASA use:  Yes Beta blocker use:  Yes ACE-Inhibitor use:  No  for medical reason not indicated P2Y12 Antagonist use: [ ]  None, [x ] Plavix, [ ]  Plasugrel, [ ]  Ticlopinine, [ ]  Ticagrelor, [ ]  Other, [ ]  No for medical reason, [ ]  Non-compliant, [ ]  Not-indicated Anti-coagulant use:  [x ] None, [ ]  Warfarin, [ ]  Rivaroxaban, [ ]  Dabigatran, [ ]  Other, [ ]  No for medical reason, [ ]  Non-compliant, [ ]  Not-indicated

## 2021-02-25 NOTE — Progress Notes (Signed)
VASCULAR AND VEIN SPECIALISTS OF [place] PROGRESS NOTE  ASSESSMENT / PLAN: FRANCHOT POLLITT is a 81 y.o. male status post R TCAR 02/24/21. Continue ASA/Plavix/Statin. OK for discharge today.    SUBJECTIVE: No complaints.  At neurologic baseline.  Pain well controlled.    OBJECTIVE: BP (!) 102/51 (BP Location: Right Arm)    Pulse 70    Temp 98.4 F (36.9 C) (Oral)    Resp 20    Ht 5\' 10"  (1.778 m)    Wt 79.3 kg    SpO2 90%    BMI 25.08 kg/m   Intake/Output Summary (Last 24 hours) at 02/25/2021 1143 Last data filed at 02/25/2021 0417 Gross per 24 hour  Intake 1367.36 ml  Output 700 ml  Net 667.36 ml    Constitutional: well appearing. no acute distress. CNS: 5/5 throughout. CN intact. Cardiac: RRR. Pulmonary: unlabored Vascular: neck soft. R neck incision with surrounding ecchymosis.   CBC Latest Ref Rng & Units 02/25/2021 02/22/2021 12/14/2018  WBC 4.0 - 10.5 K/uL 10.8(H) 8.2 11.1(H)  Hemoglobin 13.0 - 17.0 g/dL 10.0(L) 12.6(L) 11.3(L)  Hematocrit 39.0 - 52.0 % 32.2(L) 42.2 37.3(L)  Platelets 150 - 400 K/uL 246 290 301     CMP Latest Ref Rng & Units 02/25/2021 02/22/2021 01/05/2021  Glucose 70 - 99 mg/dL 132(H) 112(H) -  BUN 8 - 23 mg/dL 26(H) 22 -  Creatinine 0.61 - 1.24 mg/dL 1.43(H) 1.42(H) 1.50(H)  Sodium 135 - 145 mmol/L 137 143 -  Potassium 3.5 - 5.1 mmol/L 5.2(H) 4.4 -  Chloride 98 - 111 mmol/L 107 104 -  CO2 22 - 32 mmol/L 26 29 -  Calcium 8.9 - 10.3 mg/dL 7.9(L) 9.1 -  Total Protein 6.5 - 8.1 g/dL - 7.7 -  Total Bilirubin 0.3 - 1.2 mg/dL - 0.5 -  Alkaline Phos 38 - 126 U/L - 111 -  AST 15 - 41 U/L - 24 -  ALT 0 - 44 U/L - 18 -    Estimated Creatinine Clearance: 44 mL/min (A) (by C-G formula based on SCr of 1.43 mg/dL (H)).   Yevonne Aline. Stanford Breed, MD [facility]ists of [facility] Phone Number: [phone] 02/25/2021 11:43 AM

## 2021-02-25 NOTE — Plan of Care (Signed)

## 2021-02-25 NOTE — TOC Transition Note (Signed)
Transition of Care (TOC) - CM/SW Discharge Note Marvetta Gibbons RN, BSN Transitions of Care Unit 4E- RN Case Manager See Treatment Team for direct phone #    Patient Details  Name: William Kirk MRN: [id number] Date of Birth: March 30, 1940  Transition of Care [facility]) CM/SW Contact:  Dawayne Patricia, RN Phone Number: 02/25/2021, 2:25 PM   Clinical Narrative:    Pt stable for transition home today, s/p TCAR. From home with spouse. Transition of Care Department [facility]) has reviewed patient and no TOC needs have been identified at this time.  Notified by Lattie Haw with [facility] that they have pre-op referral for [facility] protocol to f/u with pt post discharge for [facility] needs.   Final next level of care: [place] Barriers to Discharge: No Barriers Identified   Patient Goals and CMS Choice     Choice offered to / list presented to : NA  Discharge Placement                 Home w/ [facility]      Discharge Plan and Services   Discharge Planning Services: NA Post Acute Care Choice: NA          DME Arranged: N/A DME Agency: NA       HH Arranged:  (has pre-op referral protocol from vascular office) [place]: [place] Date [place]: 02/24/21   Representative spoke with at [place]: [place] ([place]) Interventions     Readmission Risk Interventions Readmission Risk Prevention Plan 02/25/2021 11/08/2018 10/23/2018  Post Dischage Appt Complete - -  Medication Screening Complete - -  Transportation Screening Complete Complete Complete  PCP or Specialist Appt within 3-5 Days - Complete Complete  HRI or Home Care Consult - Complete Complete  Social Work Consult for [place] Planning/Counseling - Complete Complete  Palliative Care Screening - Not Applicable Not Applicable  Medication Review Press photographer) - Complete Complete  Some recent data might be hidden

## 2021-02-25 NOTE — Progress Notes (Signed)
PHARMACIST LIPID MONITORING   William Kirk is a 81 y.o. male as/p TCAR.  Pharmacy has been consulted to optimize lipid-lowering therapy with the indication of secondary prevention for clinical ASCVD.  Recent Labs:  Lipid Panel (last 6 months):   Lab Results  Component Value Date   CHOL 140 02/25/2021   TRIG 73 02/25/2021   HDL 36 (L) 02/25/2021   CHOLHDL 3.9 02/25/2021   VLDL 15 02/25/2021   LDLCALC 89 02/25/2021    Hepatic function panel (last 6 months):   Lab Results  Component Value Date   AST 24 02/22/2021   ALT 18 02/22/2021   ALKPHOS 111 02/22/2021   BILITOT 0.5 02/22/2021    SCr (since admission):   Serum creatinine: 1.43 mg/dL (H) 02/25/21 0206 Estimated creatinine clearance: 44 mL/min (A)  Current therapy and lipid therapy tolerance Current lipid-lowering therapy: atorvastatin 20mg /d Documented or reported allergies or intolerances to lipid-lowering therapies (if applicable): none  Assessment:   Patient agrees with changes to lipid-lowering therapy  Plan:    1.Statin intensity (high intensity recommended for all patients regardless of the LDL):  Add or increase statin to high intensity.  2.Add ezetimibe (if any one of the following):   Not indicated at this time.  3.Refer to lipid clinic:   No  4.Follow-up with:  Primary care provider -  5.Follow-up labs after discharge:  Changes in lipid therapy were made. Check a lipid panel in 8-12 weeks then annually.     Hildred Laser, PharmD Clinical Pharmacist **Pharmacist phone directory can now be found on [email or web address] ([email or web address]).  Listed under [place].

## 2021-03-02 ENCOUNTER — Encounter: Payer: No Typology Code available for payment source | Admitting: Physical Medicine and Rehabilitation

## 2021-03-05 ENCOUNTER — Other Ambulatory Visit: Payer: Self-pay

## 2021-03-05 ENCOUNTER — Ambulatory Visit (HOSPITAL_COMMUNITY)
Admission: RE | Admit: 2021-03-05 | Discharge: 2021-03-05 | Disposition: A | Discharge status: Home / Self Care | Payer: No Typology Code available for payment source | Attending: Vascular Surgery | Admitting: Vascular Surgery

## 2021-03-05 ENCOUNTER — Encounter (HOSPITAL_COMMUNITY)
Admission: RE | Disposition: A | Discharge status: Home / Self Care | Payer: Self-pay | Source: Home / Self Care | Attending: Vascular Surgery

## 2021-03-05 DIAGNOSIS — I70245 Atherosclerosis of native arteries of left leg with ulceration of other part of foot: Secondary | ICD-10-CM | POA: Insufficient documentation

## 2021-03-05 DIAGNOSIS — Z79899 Other long term (current) drug therapy: Secondary | ICD-10-CM | POA: Insufficient documentation

## 2021-03-05 DIAGNOSIS — Z7982 Long term (current) use of aspirin: Secondary | ICD-10-CM | POA: Insufficient documentation

## 2021-03-05 DIAGNOSIS — Z87891 Personal history of nicotine dependence: Secondary | ICD-10-CM | POA: Insufficient documentation

## 2021-03-05 DIAGNOSIS — L97529 Non-pressure chronic ulcer of other part of left foot with unspecified severity: Secondary | ICD-10-CM | POA: Insufficient documentation

## 2021-03-05 DIAGNOSIS — I6523 Occlusion and stenosis of bilateral carotid arteries: Secondary | ICD-10-CM | POA: Insufficient documentation

## 2021-03-05 HISTORY — PX: PERIPHERAL VASCULAR INTERVENTION: CATH118257

## 2021-03-05 HISTORY — PX: PERIPHERAL VASCULAR BALLOON ANGIOPLASTY: CATH118281

## 2021-03-05 HISTORY — PX: ABDOMINAL AORTOGRAM W/LOWER EXTREMITY: CATH118223

## 2021-03-05 LAB — POCT I-STAT, CHEM 8
BUN: 20 mg/dL (ref 8–23)
Calcium, Ion: 1.26 mmol/L (ref 1.15–1.40)
Chloride: 101 mmol/L (ref 98–111)
Creatinine, Ser: 1.3 mg/dL — ABNORMAL HIGH (ref 0.61–1.24)
Glucose, Bld: 80 mg/dL (ref 70–99)
HCT: 37 % — ABNORMAL LOW (ref 39.0–52.0)
Hemoglobin: 12.6 g/dL — ABNORMAL LOW (ref 13.0–17.0)
Potassium: 4 mmol/L (ref 3.5–5.1)
Sodium: 141 mmol/L (ref 135–145)
TCO2: 28 mmol/L (ref 22–32)

## 2021-03-05 SURGERY — ABDOMINAL AORTOGRAM W/LOWER EXTREMITY
Anesthesia: LOCAL | Laterality: Bilateral

## 2021-03-05 MED ORDER — FENTANYL CITRATE (PF) 100 MCG/2ML IJ SOLN
INTRAMUSCULAR | Status: AC
  Filled 2021-03-05: qty 2

## 2021-03-05 MED ORDER — SODIUM CHLORIDE 0.9 % IV SOLN: 250.0000 mL | INTRAVENOUS | Status: DC | PRN

## 2021-03-05 MED ORDER — ONDANSETRON HCL 4 MG/2ML IJ SOLN: 4.0000 mg | Freq: Four times a day (QID) | INTRAMUSCULAR | Status: DC | PRN

## 2021-03-05 MED ORDER — MIDAZOLAM HCL 2 MG/2ML IJ SOLN
INTRAMUSCULAR | Status: DC | PRN
  Administered 2021-03-05: 1 mg via INTRAVENOUS

## 2021-03-05 MED ORDER — HEPARIN (PORCINE) IN NACL 1000-0.9 UT/500ML-% IV SOLN
INTRAVENOUS | Status: DC | PRN
  Administered 2021-03-05 (×2): 500 mL

## 2021-03-05 MED ORDER — LABETALOL HCL 5 MG/ML IV SOLN: 10.0000 mg | INTRAVENOUS | Status: DC | PRN

## 2021-03-05 MED ORDER — LIDOCAINE HCL (PF) 1 % IJ SOLN
INTRAMUSCULAR | Status: DC | PRN
  Administered 2021-03-05: 15 mL

## 2021-03-05 MED ORDER — SODIUM CHLORIDE 0.9% FLUSH: 3.0000 mL | Freq: Two times a day (BID) | INTRAVENOUS | Status: DC

## 2021-03-05 MED ORDER — HEPARIN SODIUM (PORCINE) 1000 UNIT/ML IJ SOLN
INTRAMUSCULAR | Status: DC | PRN
  Administered 2021-03-05: 7000 [IU] via INTRAVENOUS

## 2021-03-05 MED ORDER — HEPARIN SODIUM (PORCINE) 1000 UNIT/ML IJ SOLN
INTRAMUSCULAR | Status: AC
  Filled 2021-03-05: qty 10

## 2021-03-05 MED ORDER — HEPARIN (PORCINE) IN NACL 1000-0.9 UT/500ML-% IV SOLN
INTRAVENOUS | Status: AC
  Filled 2021-03-05: qty 1000

## 2021-03-05 MED ORDER — IODIXANOL 320 MG/ML IV SOLN
INTRAVENOUS | Status: DC | PRN
  Administered 2021-03-05: 150 mL

## 2021-03-05 MED ORDER — ACETAMINOPHEN 325 MG PO TABS: 650.0000 mg | ORAL_TABLET | ORAL | Status: DC | PRN

## 2021-03-05 MED ORDER — SODIUM CHLORIDE 0.9 % IV SOLN: INTRAVENOUS | Status: DC

## 2021-03-05 MED ORDER — HYDRALAZINE HCL 20 MG/ML IJ SOLN: 5.0000 mg | INTRAMUSCULAR | Status: DC | PRN

## 2021-03-05 MED ORDER — MIDAZOLAM HCL 2 MG/2ML IJ SOLN
INTRAMUSCULAR | Status: AC
  Filled 2021-03-05: qty 2

## 2021-03-05 MED ORDER — SODIUM CHLORIDE 0.9% FLUSH: 3.0000 mL | INTRAVENOUS | Status: DC | PRN

## 2021-03-05 MED ORDER — FENTANYL CITRATE (PF) 100 MCG/2ML IJ SOLN
INTRAMUSCULAR | Status: DC | PRN
  Administered 2021-03-05: 50 ug via INTRAVENOUS

## 2021-03-05 MED ORDER — SODIUM CHLORIDE 0.9 % WEIGHT BASED INFUSION: 1.0000 mL/kg/h | INTRAVENOUS | Status: DC

## 2021-03-05 SURGICAL SUPPLY — 24 items
BALLN MUSTANG 5X150X135 (BALLOONS) ×3
BALLOON MUSTANG 5X150X135 (BALLOONS) IMPLANT
CATH OMNI FLUSH 5F 65CM (CATHETERS) ×1 IMPLANT
CATH QUICKCROSS .035X135CM (MICROCATHETER) ×1 IMPLANT
CLOSURE PERCLOSE PROSTYLE (VASCULAR PRODUCTS) ×1 IMPLANT
DCB RANGER 4.0X200 150 (BALLOONS) IMPLANT
GLIDEWIRE ADV .035X260CM (WIRE) ×1 IMPLANT
GLIDEWIRE NITREX 0.018X80X5 (WIRE) ×3
GUIDEWIRE ANGLED .035X150CM (WIRE) ×1 IMPLANT
GUIDEWIRE NITREX 0.018X80X5 (WIRE) IMPLANT
KIT ENCORE 26 ADVANTAGE (KITS) ×1 IMPLANT
KIT MICROPUNCTURE NIT STIFF (SHEATH) ×1 IMPLANT
KIT PV (KITS) ×4 IMPLANT
RANGER DCB 4.0X200 150 (BALLOONS) ×3
SHEATH HYDRO PINNACLE 6FR 45 (SHEATH) ×1 IMPLANT
SHEATH PINNACLE 5F 10CM (SHEATH) ×1 IMPLANT
SHEATH PROBE COVER 6X72 (BAG) ×1 IMPLANT
STENT ELUVIA 6X150X130 (Permanent Stent) ×2 IMPLANT
STENT ELUVIA 6X80X130 (Permanent Stent) ×1 IMPLANT
SYR MEDRAD MARK V 150ML (SYRINGE) ×1 IMPLANT
TRANSDUCER W/STOPCOCK (MISCELLANEOUS) ×4 IMPLANT
TRAY PV CATH (CUSTOM PROCEDURE TRAY) ×4 IMPLANT
WIRE BENTSON .035X145CM (WIRE) ×1 IMPLANT
WIRE G V18X300CM (WIRE) ×1 IMPLANT

## 2021-03-05 NOTE — Progress Notes (Signed)
Informed PV staff that pt has a "croupy" cough, PV staff state they will let Dr. Stanford Breed know, pt states he has had this new cough for about 1 month, temp checked and resulted at 98.0, temporal, lung sounds diminished upon auscultation, safety maintained

## 2021-03-05 NOTE — H&P (Signed)
VASCULAR AND VEIN SPECIALISTS OF [place]  ASSESSMENT / PLAN: William Kirk is a 81 y.o. male with:  1) atherosclerosis of native arteries of left lower extremity causing ulceration   2) asymptomatic right 80-99 % carotid artery stenosis. Status post R TCAR.  The patient should continue best medical therapy for atherosclerosis including: Complete cessation from all tobacco products. Blood glucose control with goal A1c < 7%. Blood pressure control with goal blood pressure < 140/90 mmHg. Lipid reduction therapy with goal LDL-C <100 mg/dL (<70 if symptomatic from PAD).  Aspirin 81mg  PO QD.  Atorvastatin 40-80mg  PO QD (or other "high intensity" statin therapy). Adequate hydration (at least 2 liters / day) if patient's heart and kidney function is adequate. Daily walking to and past the point of discomfort. Patient counseled to keep a log of exercise distance.  Plan LLE angiogram with intervention today.  CHIEF COMPLAINT: intermittent claudication  HISTORY OF PRESENT ILLNESS: William Kirk is a 81 y.o. male referred to clinic for evaluation of left lower extremity intermittent claudication.  He is a [place] patient and has undergone ABI and carotid artery duplex with his care team at the [place].  He reports he can walk about a half block before typical calf cramping begins.  This improves with rest.  He specifically denies symptoms typical of ischemic rest pain.  He has no ulcerations about his foot.  He is able to function with his claudication.  He was able to mow part of his yard this past weekend.  He has previously undergone percutaneous intervention in his right lower extremity but was not able to give much detail.  12/22/20: Patient returns to clinic for surveillance.  He reports his claudication symptoms are about the same.  He is able to walk, but is bothered somewhat by claudication.  He is not quite at a disabling level yet.  We reviewed his carotid duplex in detail today, and I  counseled him extensively about its findings.  We reviewed the natural history of carotid artery stenosis, and the rationale for prophylactic, asymptomatic intervention.  03/05/21: Successful TCAR. Noted left foot ulcer during admission for TCAR. Offered angiogram. No questions today.   Past Medical History:  Diagnosis Date   Anxiety    Arthritis    "hands" (09/21/2016)   Bleeding stomach ulcer 2000s   COPD (chronic obstructive pulmonary disease) (HCC)    Cough    Depression    Dyspnea    Hypertension    Pneumonia    "now and several times before this" (09/21/2016)   PVD (peripheral vascular disease) ([place]) 2014   prev PTCA on RLE    Past Surgical History:  Procedure Laterality Date   IR ANGIOGRAM SELECTIVE EACH ADDITIONAL VESSEL  07/26/2018   IR ANGIOGRAM SELECTIVE EACH ADDITIONAL VESSEL  07/26/2018   IR ANGIOGRAM SELECTIVE EACH ADDITIONAL VESSEL  07/26/2018   IR EMBO ART  VEN HEMORR LYMPH EXTRAV  INC GUIDE ROADMAPPING  07/26/2018   IR GASTROSTOMY TUBE MOD SED  12/14/2018   IR US GUIDE VASC ACCESS RIGHT  07/26/2018   JOINT REPLACEMENT     KNEE ARTHROSCOPY Right X 1   TOTAL HIP ARTHROPLASTY Right 2009   TRANSCAROTID ARTERY REVASCULARIZATION  Right 02/24/2021   Procedure: RIGHT TRANSCAROTID ARTERY REVASCULARIZATION;  Surgeon: Cherre Robins, MD;  Location: [facility];  Service: Vascular;  Laterality: Right;   ULTRASOUND GUIDANCE FOR VASCULAR ACCESS Left 02/24/2021   Procedure: ULTRASOUND GUIDANCE FOR VASCULAR ACCESS;  Surgeon: Cherre Robins, MD;  Location: [facility];  Service: Vascular;  Laterality: Left;   VIDEO ASSISTED THORACOSCOPY (VATS)/ LOBECTOMY Right 10/18/2018   Procedure: VIDEO ASSISTED THORACOSCOPY (VATS)/ right lower LOBECTOMY;  Surgeon: Melrose Nakayama, MD;  Location: [facility];  Service: Thoracic;  Laterality: Right;   VIDEO ASSISTED THORACOSCOPY (VATS)/DECORTICATION Right 11/09/2018   Procedure: REDO VIDEO ASSISTED THORACOSCOPY (VATS)/DECORTICATION/DRAIN EFFUSION;  Surgeon:  Melrose Nakayama, MD;  Location: [facility];  Service: Thoracic;  Laterality: Right;   VIDEO BRONCHOSCOPY N/A 07/27/2018   Procedure: VIDEO BRONCHOSCOPY WITHOUT FLUORO;  Surgeon: Collene Gobble, MD;  Location: [facility];  Service: Cardiopulmonary;  Laterality: N/A;   VIDEO BRONCHOSCOPY N/A 11/09/2018   Procedure: VIDEO BRONCHOSCOPY;  Surgeon: Melrose Nakayama, MD;  Location[facility] OR;  Service: Thoracic;  Laterality: N/A;    Family History  Problem Relation Age of Onset   Cancer Father 67       Brain   Dementia Mother     Social History   Socioeconomic History   Marital status: Married    Spouse name: Not on file   Number of children: Not on file   Years of education: 16   Highest education level: Not on file  Occupational History   Occupation: Event organiser  Tobacco Use   Smoking status: Former    Packs/day: 1.00    Years: 55.00    Pack years: 55.00    Types: Cigarettes    Quit date: 04/2015    Years since quitting: 5.9   Smokeless tobacco: Never  Vaping Use   Vaping Use: Never used  Substance and Sexual Activity   Alcohol use: No    Alcohol/week: 0.0 standard drinks   Drug use: No   Sexual activity: Not Currently    Partners: Female  Other Topics Concern   Not on file  Social History Narrative   Not on file   Social Determinants of Health   Financial Resource Strain: Not on file  Food Insecurity: Not on file  Transportation Needs: Not on file  Physical Activity: Not on file  Stress: Not on file  Social Connections: Not on file  Intimate Partner Violence: Not on file    Allergies  Allergen Reactions   Testosterone Rash    Current Facility-Administered Medications  Medication Dose Route Frequency Provider Last Rate Last Admin   0.9 %  sodium chloride infusion   Intravenous Continuous Cherre Robins, MD 100 mL/hr at 03/05/21 0628 New Bag at 03/05/21 8299   fentaNYL (SUBLIMAZE) injection    PRN Cherre Robins, MD   50 mcg at 03/05/21 0736   Heparin  (Porcine) in NaCl 1000-0.9 UT/500ML-% SOLN    PRN Cherre Robins, MD   500 mL at 03/05/21 0843   heparin sodium (porcine) injection    PRN Cherre Robins, MD   7,000 Units at 03/05/21 0800   iodixanol (VISIPAQUE) 320 MG/ML injection    PRN Cherre Robins, MD   150 mL at 03/05/21 0838   lidocaine (PF) (XYLOCAINE) 1 % injection    PRN Cherre Robins, MD   15 mL at 03/05/21 0738   midazolam (VERSED) injection    PRN Cherre Robins, MD   1 mg at 03/05/21 0736    REVIEW OF SYSTEMS:  [X]  denotes positive finding, [ ]  denotes negative finding Cardiac  Comments:  Chest pain or chest pressure:    Shortness of breath upon exertion:    Short of breath when lying flat:  Irregular heart rhythm:        Vascular    Pain in calf, thigh, or hip brought on by ambulation:    Pain in feet at night that wakes you up from your sleep:     Blood clot in your veins:    Leg swelling:         Pulmonary    Oxygen at home:    Productive cough:     Wheezing:         Neurologic    Sudden weakness in arms or legs:     Sudden numbness in arms or legs:     Sudden onset of difficulty speaking or slurred speech:    Temporary loss of vision in one eye:     Problems with dizziness:         Gastrointestinal    Blood in stool:     Vomited blood:         Genitourinary    Burning when urinating:     Blood in urine:        Psychiatric    Major depression:         Hematologic    Bleeding problems:    Problems with blood clotting too easily:        Skin    Rashes or ulcers:        Constitutional    Fever or chills:      PHYSICAL EXAM  Vitals:   03/05/21 0821 03/05/21 0826 03/05/21 0830 03/05/21 0835  BP: (!) 169/72 (!) 162/70 (!) 157/66 (!) 153/66  Pulse: (!) 52 (!) 53 (!) 51 (!) 53  Resp: 10 17 15  (!) 21  Temp:      TempSrc:      SpO2: 99% 100% 100% 100%  Weight:      Height:         Constitutional: well appearing. no distress. Appears well nourished.  Neurologic: CN intact. no  focal findings. no sensory loss. Psychiatric: Mood and affect symmetric and appropriate. Eyes: No icterus. No conjunctival pallor. Ears, nose, throat: mucous membranes moist. Midline trachea.  Cardiac: regular rate and rhythm.  Respiratory: unlabored. Abdominal: soft, non-tender, non-distended.  Peripheral vascular:  No palpable pedal pulses.  Left lateral foot ulcer Extremity: No edema. No cyanosis. No pallor.  Skin: No gangrene. No ulceration.  Lymphatic: No Stemmer's sign. No palpable lymphadenopathy.  PERTINENT LABORATORY AND RADIOLOGIC DATA  Most recent CBC CBC Latest Ref Rng & Units 03/05/2021 02/25/2021 02/22/2021  WBC 4.0 - 10.5 K/uL - 10.8(H) 8.2  Hemoglobin 13.0 - 17.0 g/dL 12.6(L) 10.0(L) 12.6(L)  Hematocrit 39.0 - 52.0 % 37.0(L) 32.2(L) 42.2  Platelets 150 - 400 K/uL - 246 290     Most recent CMP CMP Latest Ref Rng & Units 03/05/2021 02/25/2021 02/22/2021  Glucose 70 - 99 mg/dL 80 132(H) 112(H)  BUN 8 - 23 mg/dL 20 26(H) 22  Creatinine 0.61 - 1.24 mg/dL 1.30(H) 1.43(H) 1.42(H)  Sodium 135 - 145 mmol/L 141 137 143  Potassium 3.5 - 5.1 mmol/L 4.0 5.2(H) 4.4  Chloride 98 - 111 mmol/L 101 107 104  CO2 22 - 32 mmol/L - 26 29  Calcium 8.9 - 10.3 mg/dL - 7.9(L) 9.1  Total Protein 6.5 - 8.1 g/dL - - 7.7  Total Bilirubin 0.3 - 1.2 mg/dL - - 0.5  Alkaline Phos 38 - 126 U/L - - 111  AST 15 - 41 U/L - - 24  ALT 0 - 44 U/L - - 18  Vascular Imaging:  +-------+-----------+-----------+------------+------------+   ABI/TBI Today's ABI Today's TBI Previous ABI Previous TBI   +-------+-----------+-----------+------------+------------+   Right   0.76        0.46        0.63         0.44           +-------+-----------+-----------+------------+------------+   Left    0.58        0.33        0.33         0.28           +-------+-----------+-----------+------------+------------+  Carotid Duplex Right Carotid: Velocities in the right ICA are consistent with a 80-99%  stenosis                  in the bifurcation. Calcific plaque may obscure higher  velocity .                 The proximal ECA not visualized, wavefom dampened on  distally.   Left Carotid: Velocities in the left ICA are consistent with a 1-39%  stenosis.   Vertebrals:  Bilateral vertebral arteries demonstrate antegrade flow.  Subclavians: Normal flow hemodynamics were seen in bilateral subclavian               arteries.   Yevonne Aline. Stanford Breed, MD [facility] of [facility] Phone Number: [phone] 03/05/2021 8:45 AM

## 2021-03-05 NOTE — Op Note (Signed)
DATE OF SERVICE: 03/05/2021  PATIENT:  William Kirk  81 y.o. male  PRE-OPERATIVE DIAGNOSIS:  Atherosclerosis of native arteries of left lower extremity causing ulceration  POST-OPERATIVE DIAGNOSIS:  Same  PROCEDURE:   1) US guided right common fermoral artery access 2) Aortogram 3) Left lower extremity angiogram with third order cannulation (125mL total contrast) 4) Below-the-knee drug coated angioplasty (4x234mm Ranger) 5) Femoro-popliteal angioplasty and stenting (6x150 + 6x150 + 6x89mm Eluvia) 6) Conscious sedation (63 minutes)  SURGEON:  Yevonne Aline. Stanford Breed, MD  ASSISTANT: none  ANESTHESIA:   local and IV sedation  ESTIMATED BLOOD LOSS: minimal  LOCAL MEDICATIONS USED:  LIDOCAINE   COUNTS: confirmed correct.  PATIENT DISPOSITION:  PACU - hemodynamically stable.   Delay start of Pharmacological VTE agent (>24hrs) due to surgical blood loss or risk of bleeding: no  INDICATION FOR PROCEDURE: William Kirk is a 81 y.o. male with left lateral foot ulcer in setting of severe peripheral arterial disease. After careful discussion of risks, benefits, and alternatives the patient was offered angiogram. The patient understood and wished to proceed.  OPERATIVE FINDINGS:      Severe, calcific atherosclerotic disease throughout.  Terminal aorta and iliac arteries: Right external iliac artery with 50% stenosis, otherwise no flow-limiting stenosis in the terminal aorta or iliac arteries  Right lower extremity: Common femoral artery: Visualization limited by orthopedic hardware.  Ultrasound evidence suggests severe disease. Profunda femoris artery:  Patent without significant stenosis Superficial femoral artery: Heavily diseased.  Multiple areas of severe stenosis.  Greatest is at Hunter's canal at approximately 80% stenosis. Popliteal artery: Patent without significant stenosis Anterior tibial artery: Occluded Tibioperoneal trunk: Patent without significant stenosis Peroneal  artery: Patent without significant stenosis Posterior tibial artery: Patent without significant stenosis Pedal circulation: Fills via the PT and peroneal  Left lower extremity: Common femoral artery: Patent without significant stenosis Profunda femoris artery: Patent without significant stenosis  Superficial femoral artery: Heavily diseased.  Multiple areas of severe stenosis.  Greatest is 95%+ stenosis. Popliteal artery: Heavily diseased throughout. Greatest is 95%+ stenosis. Anterior tibial artery: Dominant tibial vessel, fills the foot.  No significant stenosis Tibioperoneal trunk: Patent without significant stenosis Peroneal artery: Patent without significant stenosis Posterior tibial artery: Occluded Pedal circulation: Fills via the AT and peroneal  GLASS score. Grade 3: expect 53% 1 year re-intervention rate; expect 69% 5 year re-intervention rate; expect 49% 5 year mortality; expect 61% rate of restenosis from open operation at 5 years; expect 83% rate of restenosis from endovascular procedure.  WIfI score. 1 / 2 / 0. Clinical stage 2. Low risk of major amputation. Moderate benefit from intervention.  DESCRIPTION OF PROCEDURE: After identification of the patient in the pre-operative holding area, the patient was transferred to the operating room. The patient was positioned supine on the operating room table. Anesthesia was induced. The groins was prepped and draped in standard fashion. A surgical pause was performed confirming correct patient, procedure, and operative location.  The right groin was anesthetized with subcutaneous injection of 1% lidocaine. Using ultrasound guidance, the right common femoral artery was accessed with micropuncture technique. Fluoroscopy was used to confirm cannulation over the femoral head. The 48F sheath was upsized to 78F.   A Benson wire was advanced into the distal aorta. Over the wire an omni flush catheter was advanced to the level of L2. Aortogram was  performed - see above for details. Bilateral lower extremity runoff angiography was performed - see above for details.  The left common iliac  artery was selected with a glidewire advantage guidewire. The wire was advanced into the common femoral artery. Over the wire the omni flush catheter was advanced into the external iliac artery. Selective angiography was performed - see above for details.   The decision was made to intervene. The patient was heparinized with 7000 units of heparin. The 746F sheath was exchanged for a 46F x 45cm sheath. Selective angiography of the left lower extremity was performed prior to intervention.   The lesions were treated with: Below-the-knee drug coated angioplasty (4x254mm Ranger) Femoro-popliteal angioplasty and stenting (6x150 + 6x150 + 6x34mm Eluvia)  Completion angiography revealed:  Resolution of superficial femoral and popliteal artery stenosis above the knee.  There is some residual stenosis below the knee.  This did not appear flow-limiting.  Perclose device was used to close the arteriotomy. Hemostasis was excellent upon completion.  Conscious sedation was administered with the use of IV fentanyl and midazolam under continuous physician and nurse monitoring.  Heart rate, blood pressure, and oxygen saturation were continuously monitored.  Total sedation time was 63 minutes  Upon completion of the case instrument and sharps counts were confirmed correct. The patient was transferred to the PACU in good condition. I was present for all portions of the procedure.  PLAN: Aspirin and Plavix therapies should continue.  High intensity statin therapy should continue.  Follow-up with me in 1 month with carotid duplex, ABI and left lower extremity duplex.  Yevonne Aline. Stanford Breed, MD [facility] Phone Number: [phone] 03/05/2021 8:46 AM

## 2021-03-08 ENCOUNTER — Other Ambulatory Visit (HOSPITAL_COMMUNITY): Payer: Self-pay

## 2021-03-08 ENCOUNTER — Encounter (HOSPITAL_COMMUNITY): Payer: Self-pay | Admitting: Vascular Surgery

## 2021-03-22 ENCOUNTER — Other Ambulatory Visit: Payer: Self-pay

## 2021-03-22 ENCOUNTER — Other Ambulatory Visit (HOSPITAL_COMMUNITY): Payer: Self-pay

## 2021-03-22 DIAGNOSIS — I739 Peripheral vascular disease, unspecified: Secondary | ICD-10-CM

## 2021-03-22 DIAGNOSIS — I6521 Occlusion and stenosis of right carotid artery: Secondary | ICD-10-CM

## 2021-03-28 ENCOUNTER — Encounter: Payer: Self-pay | Admitting: Physical Medicine and Rehabilitation

## 2021-03-29 NOTE — Progress Notes (Signed)
[facility] [place] PROGRESS NOTE  ASSESSMENT / PLAN: William Kirk is a 81 y.o. 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 Blood glucose control with goal A1c < 7%. Blood pressure control with goal blood pressure < 140/90 mmHg. Lipid reduction therapy with goal LDL-C <100 mg/dL (<70 if symptomatic from PAD).  Aspirin 81mg  PO QD.  Clopidogrel 75mg  PO QD - continue for 12 months (stop 03/05/22) Atorvastatin 40-80mg  PO QD (or other "high intensity" statin therapy).  Wound in the foot appears to be healing. TCAR without technical problem. Follow up with me in 3 months with repeat ABI.  SUBJECTIVE: No complaints. Left foot healing. No neurologic symptoms.  OBJECTIVE: BP 135/72 (BP Location: Left Arm, Patient Position: Sitting, Cuff Size: Normal)    Pulse 66    Temp 98.7 F (37.1 C)    Resp 20    Ht 5\' 10"  (1.778 m)    Wt 167 lb (75.8 kg)    SpO2 96%    BMI 23.96 kg/m  NAD RRR Unlabored Right neck incision healed Left foot ulceration improving rapidly  CBC Latest Ref Rng & Units 03/05/2021 02/25/2021 02/22/2021  WBC 4.0 - 10.5 K/uL - 10.8(H) 8.2  Hemoglobin 13.0 - 17.0 g/dL 12.6(L) 10.0(L) 12.6(L)  Hematocrit 39.0 - 52.0 % 37.0(L) 32.2(L) 42.2  Platelets 150 - 400 K/uL - 246 290     CMP Latest Ref Rng & Units 03/05/2021 02/25/2021 02/22/2021  Glucose 70 - 99 mg/dL 80 132(H) 112(H)  BUN 8 - 23 mg/dL 20 26(H) 22  Creatinine 0.61 - 1.24 mg/dL 1.30(H) 1.43(H) 1.42(H)  Sodium 135 - 145 mmol/L 141 137 143  Potassium 3.5 - 5.1 mmol/L 4.0  5.2(H) 4.4  Chloride 98 - 111 mmol/L 101 107 104  CO2 22 - 32 mmol/L - 26 29  Calcium 8.9 - 10.3 mg/dL - 7.9(L) 9.1  Total Protein 6.5 - 8.1 g/dL - - 7.7  Total Bilirubin 0.3 - 1.2 mg/dL - - 0.5  Alkaline Phos 38 - 126 U/L - - 111  AST 15 - 41 U/L - - 24  ALT 0 - 44 U/L - - 18       ABI Findings:  +---------+------------------+-----+-------------+--------+   Right     Rt Pressure (mmHg) Index Waveform      Comment    +---------+------------------+-----+-------------+--------+   Brachial  125                                               +---------+------------------+-----+-------------+--------+   PTA       74                 0.56  mono                     +---------+------------------+-----+-------------+--------+   DP        55                 0.41  dampened mono            +---------+------------------+-----+-------------+--------+  Great Toe 26                 0.20  abnormal                 +---------+------------------+-----+-------------+--------+   +---------+------------------+-----+--------+-------+   Left      Lt Pressure (mmHg) Index Waveform Comment   +---------+------------------+-----+--------+-------+   Brachial  133                                         +---------+------------------+-----+--------+-------+   PTA       94                 0.71  mono               +---------+------------------+-----+--------+-------+   DP        87                 0.65  mono               +---------+------------------+-----+--------+-------+   Great Toe 28                 0.21  abnormal           +---------+------------------+-----+--------+-------+   Carotid Duplex Right Carotid: Velocities in the right ICA are consistent with a 1-39%  stenosis.                 Patent right stent.   Left Carotid: Velocities in the left ICA are consistent with a 1-39%  stenosis.   Vertebrals:  Bilateral vertebral arteries demonstrate antegrade flow.  Subclavians: Normal flow hemodynamics  were seen in bilateral subclavian               arteries.   Yevonne Aline. Stanford Breed, MD [facility] Phone Number: [phone] 03/31/2021 5:27 PM

## 2021-03-30 ENCOUNTER — Ambulatory Visit (HOSPITAL_COMMUNITY)
Admission: RE | Admit: 2021-03-30 | Discharge: 2021-03-30 | Disposition: A | Discharge status: Home / Self Care | Payer: No Typology Code available for payment source | Source: Ambulatory Visit | Attending: Vascular Surgery | Admitting: Vascular Surgery

## 2021-03-30 ENCOUNTER — Encounter: Payer: Self-pay | Admitting: Vascular Surgery

## 2021-03-30 ENCOUNTER — Ambulatory Visit (INDEPENDENT_AMBULATORY_CARE_PROVIDER_SITE_OTHER): Payer: No Typology Code available for payment source | Admitting: Vascular Surgery

## 2021-03-30 ENCOUNTER — Other Ambulatory Visit: Payer: Self-pay

## 2021-03-30 ENCOUNTER — Ambulatory Visit (INDEPENDENT_AMBULATORY_CARE_PROVIDER_SITE_OTHER)
Admission: RE | Admit: 2021-03-30 | Discharge: 2021-03-30 | Disposition: A | Discharge status: Home / Self Care | Payer: No Typology Code available for payment source | Source: Ambulatory Visit | Attending: Vascular Surgery | Admitting: Vascular Surgery

## 2021-03-30 ENCOUNTER — Encounter: Payer: No Typology Code available for payment source | Admitting: Vascular Surgery

## 2021-03-30 ENCOUNTER — Encounter (HOSPITAL_COMMUNITY): Payer: No Typology Code available for payment source

## 2021-03-30 VITALS — BP 135/72 | HR 66 | Temp 98.7°F | Resp 20 | Ht 70.0 in | Wt 167.0 lb

## 2021-03-30 DIAGNOSIS — I739 Peripheral vascular disease, unspecified: Secondary | ICD-10-CM

## 2021-03-30 DIAGNOSIS — I6523 Occlusion and stenosis of bilateral carotid arteries: Secondary | ICD-10-CM

## 2021-03-30 DIAGNOSIS — I6521 Occlusion and stenosis of right carotid artery: Secondary | ICD-10-CM

## 2021-04-06 ENCOUNTER — Other Ambulatory Visit: Payer: Self-pay | Admitting: *Deleted

## 2021-04-06 DIAGNOSIS — I739 Peripheral vascular disease, unspecified: Secondary | ICD-10-CM

## 2021-04-22 ENCOUNTER — Telehealth: Payer: Self-pay | Admitting: Pulmonary Disease

## 2021-04-22 NOTE — Telephone Encounter (Signed)
Pt is coughing up blood. Started on Tuesday night and states that it's not a lot, but enought for her to want Dr. Vaughan Browner to know. States it is dark red now. Pt is on a blood thinner and wants to know if that could be affecting him. Scheduled regular f/u in May.Please advise ?

## 2021-04-22 NOTE — Telephone Encounter (Signed)
Spoke to patient's spouse, [name](DPR). ?[name] wanted to make Dr. Vaughan Browner aware that pt coughed up a small amount of blood on Tuesday, yesterday blood was dark red and about a tablespoon. This occurred 3 times yesterday. This morning blood is still present but has decreased some in amount. ?Denies f/c/s or additional sx.  ?Spo2 maintaing around 95%. ?Pt started tessalon last night with some improvement in cough.  ? ? ?Dr. Fredirick Lathe, please advise. Thanks ? ?

## 2021-04-22 NOTE — Telephone Encounter (Signed)
Patient's wife, [name](DPR) is aware of recommendations and voiced her understanding.  ?CT ordered.  ?She will call back for appt after CT is scheduled.  ?Nothing further needed.  ? ?

## 2021-04-22 NOTE — Telephone Encounter (Signed)
Noted. This could be from the Plavix he is on but has been a recurrent issue. Please order CT chest without contrast and follow up in clinic. If he has an increase in the amount of blood then advise to go to the emergency room ?

## 2021-04-30 ENCOUNTER — Telehealth: Payer: Self-pay | Admitting: Pulmonary Disease

## 2021-04-30 ENCOUNTER — Other Ambulatory Visit: Payer: Self-pay

## 2021-04-30 ENCOUNTER — Ambulatory Visit (INDEPENDENT_AMBULATORY_CARE_PROVIDER_SITE_OTHER)
Admission: RE | Admit: 2021-04-30 | Discharge: 2021-04-30 | Disposition: A | Discharge status: Home / Self Care | Payer: No Typology Code available for payment source | Source: Ambulatory Visit | Attending: Pulmonary Disease | Admitting: Pulmonary Disease

## 2021-04-30 DIAGNOSIS — R042 Hemoptysis: Secondary | ICD-10-CM

## 2021-04-30 NOTE — Telephone Encounter (Signed)
Spoke with pt's wife [name] (per [facility]) who states they will have CT completed today. She also wanted to let Dr. Vaughan Browner know every evening for 1 - 2 hours pt runs temp of 100 - 102. She also stated that his sputum has darkened. Pt's wife denies any other symptoms. Pt's wife just wanted to let Dr. Vaughan Browner know.  ? ?Routing to Dr. Vaughan Browner for  [place]    ?

## 2021-05-03 ENCOUNTER — Telehealth: Payer: Self-pay | Admitting: Adult Health

## 2021-05-03 NOTE — Telephone Encounter (Signed)
CT chest call report from Radiology  ? 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 ?2. Patchy airspace disease and nodularity at the LEFT lung base, ?this raises the question of pneumonia, likely aspiration related ?particularly given the presence of debris in RIGHT lower lobe ?bronchus. ?3. Other areas of peripheral nodularity more suggestive of post ?infectious or inflammatory changes with chronicity. ?4. Emphysema and aortic atherosclerosis. ?5. Three-vessel coronary artery disease. ?  ?Patient with sick sx , above report from Xray - will need ov with Dr. Vaughan Browner or APP to sort out .  ?If not OV with MD , then put of APP schedule for 05/04/21.  ? ?Please contact office for sooner follow up if symptoms do not improve or worsen or seek emergency care  ? ?

## 2021-05-04 NOTE — Telephone Encounter (Signed)
Spoke to patient's spouse, [name](DPR). Offered OV for day and she declined as it did not work with her scheduled.  ?Appt scheduled 05/07/2021 at 2:00. ? ?Routing to TP as an [place]. ?

## 2021-05-07 ENCOUNTER — Encounter: Payer: Self-pay | Admitting: Adult Health

## 2021-05-07 ENCOUNTER — Ambulatory Visit (INDEPENDENT_AMBULATORY_CARE_PROVIDER_SITE_OTHER): Payer: No Typology Code available for payment source | Admitting: Adult Health

## 2021-05-07 DIAGNOSIS — J449 Chronic obstructive pulmonary disease, unspecified: Secondary | ICD-10-CM

## 2021-05-07 DIAGNOSIS — J441 Chronic obstructive pulmonary disease with (acute) exacerbation: Secondary | ICD-10-CM

## 2021-05-07 DIAGNOSIS — R042 Hemoptysis: Secondary | ICD-10-CM

## 2021-05-07 LAB — CBC WITH DIFFERENTIAL/PLATELET
Basophils Absolute: 0 10*3/uL (ref 0.0–0.1)
Basophils Relative: 0.4 % (ref 0.0–3.0)
Eosinophils Absolute: 0.4 10*3/uL (ref 0.0–0.7)
Eosinophils Relative: 4.2 % (ref 0.0–5.0)
HCT: 36.3 % — ABNORMAL LOW (ref 39.0–52.0)
Hemoglobin: 11.8 g/dL — ABNORMAL LOW (ref 13.0–17.0)
Lymphocytes Relative: 20.4 % (ref 12.0–46.0)
Lymphs Abs: 2.1 10*3/uL (ref 0.7–4.0)
MCHC: 32.5 g/dL (ref 30.0–36.0)
MCV: 84.5 fl (ref 78.0–100.0)
Monocytes Absolute: 0.6 10*3/uL (ref 0.1–1.0)
Monocytes Relative: 5.6 % (ref 3.0–12.0)
Neutro Abs: 7.1 10*3/uL (ref 1.4–7.7)
Neutrophils Relative %: 69.4 % (ref 43.0–77.0)
Platelets: 376 10*3/uL (ref 150.0–400.0)
RBC: 4.3 Mil/uL (ref 4.22–5.81)
RDW: 16.3 % — ABNORMAL HIGH (ref 11.5–15.5)
WBC: 10.2 10*3/uL (ref 4.0–10.5)

## 2021-05-07 MED ORDER — ALBUTEROL SULFATE HFA 108 (90 BASE) MCG/ACT IN AERS: INHALATION_SPRAY | RESPIRATORY_TRACT | 6 refills | Status: DC

## 2021-05-07 MED ORDER — ALBUTEROL SULFATE (2.5 MG/3ML) 0.083% IN NEBU: 2.5000 mg | INHALATION_SOLUTION | Freq: Four times a day (QID) | RESPIRATORY_TRACT | 6 refills | Status: AC | PRN

## 2021-05-07 MED ORDER — VALACYCLOVIR HCL 1 G PO TABS: 2000.0000 mg | ORAL_TABLET | Freq: Two times a day (BID) | ORAL | 0 refills | Status: AC

## 2021-05-07 MED ORDER — LEVOFLOXACIN 500 MG PO TABS: 500.0000 mg | ORAL_TABLET | Freq: Every day | ORAL | 0 refills | Status: DC

## 2021-05-07 MED ORDER — LEVOFLOXACIN 500 MG PO TABS: 500.0000 mg | ORAL_TABLET | Freq: Every day | ORAL | 0 refills | Status: AC

## 2021-05-07 MED ORDER — VALACYCLOVIR HCL 1 G PO TABS: 2000.0000 mg | ORAL_TABLET | Freq: Two times a day (BID) | ORAL | 0 refills | Status: DC

## 2021-05-07 MED ORDER — SPIRIVA RESPIMAT 2.5 MCG/ACT IN AERS: 2.0000 | INHALATION_SPRAY | Freq: Every morning | RESPIRATORY_TRACT | 6 refills | Status: DC

## 2021-05-07 MED ORDER — FLUTICASONE-SALMETEROL 250-50 MCG/ACT IN AEPB: 1.0000 | INHALATION_SPRAY | Freq: Two times a day (BID) | RESPIRATORY_TRACT | 6 refills | Status: AC

## 2021-05-07 NOTE — Progress Notes (Signed)
? ?@Patient  ID: William Kirk, male    DOB: 04-Nov-1940, 81 y.o.   MRN: [id number] ? ?Chief Complaint  ?Patient presents with  ? Follow-up  ? ? ?Referring provider: ?Floreen Comber, * ? ?HPI: ?81 year old male followed for COPD, bronchiectasis, recurrent pneumonia, chronic aspiration, hemoptysis status post right lower lobectomy in September 2020 complicated by prolonged infection, empyema and chest tube placement ?Previously followed at the [facility]. ? ?TEST/EVENTS :  ?Pets: Dogs ?Occupation: Works in [employer].  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  No ongoing exposures at home.  No feather pillows or comforter ?Smoking history: 30-pack-year smoker.  Quit in 2015 ?Travel history: No significant travel history ?Relevant family history: No family history of lung disease ? ?05/07/2021 Follow up ; COPD , Abnormal CT chest  ?Patient presents for follow up . Complains over last 2 weeks he has had increased cough with bloody mucus.  Cough for started out with increased congestion mixed with some blood.  1 week later he developed fever for about 2 days.  Fever has resolved but he continues to have increased congested cough with bloody mucus.  Usually coughs up blood and mucus that is mixed once a day. ?Appetite is good with no nausea vomiting or diarrhea.  He remains active does yard work.  He remains on Advair and Spiriva.  Gets these to the [place] system.  He has been using his albuterol nebulizer which seems to be helping break up the mucus more effectively. ?He denies any chest pain, orthopnea, calf pain, syncope.  Patient has also had a cold sores over the last week. ?CT chest done on April 30, 2021 with a small lenticular collection in the posterior right chest is markedly decreased from previous CT in 2020.  Patchy nodularity and consolidative changes in the left lung are increased.  Stable peripheral subpleural nodularity in  the left chest, emphysema and debris and the right lower lobe bronchus. ? ?Patient has recently had stent placement and is on Plavix and aspirin over the last 3 months.  Feels that this is caused more bloody mucus because he is had a few episodes ever since starting Plavix. ? ? ?Allergies  ?Allergen Reactions  ? Testosterone Rash  ? ? ?Immunization History  ?Administered Date(s) Administered  ? DTaP / IPV 04/03/2019  ? Fluad Quad(high Dose 65+) 10/22/2018, 11/09/2020  ? Influenza, High Dose Seasonal PF 11/07/2016  ? Influenza-Unspecified 11/08/2014  ? Moderna Covid-19 Vaccine Bivalent Booster 44yrs & up 11/09/2020  ? Moderna SARS-COV2 Booster Vaccination 05/25/2020  ? Moderna Sars-Covid-2 Vaccination 02/27/2019, 04/08/2019, 11/05/2019, 05/25/2020  ? Pneumococcal Conjugate-13 05/16/2013  ? Pneumococcal Polysaccharide-23 12/25/2015, 10/22/2018  ? Pneumococcal-Unspecified 11/16/2002, 04/14/2005, 04/14/2008  ? Tdap 11/15/2013  ? Zoster Recombinat (Shingrix) 04/27/2020, 10/08/2020  ? Zoster, Live 06/29/2010, 04/15/2014  ? ? ?Past Medical History:  ?Diagnosis Date  ? Anxiety   ? Arthritis   ? "hands" (09/21/2016)  ? Bleeding stomach ulcer 2000s  ? COPD (chronic obstructive pulmonary disease) (HCC)   ? Cough   ? Depression   ? Dyspnea   ? Hypertension   ? Pneumonia   ? "now and several times before this" (09/21/2016)  ? PVD (peripheral vascular disease) (HCC) 2014  ? prev PTCA on RLE  ? ? ?Tobacco History: ?Social History  ? ?Tobacco Use  ?Smoking Status Former  ? Packs/day: 1.00  ? Years: 55.00  ? Pack years: 55.00  ? Types:  Cigarettes  ? Quit date: 04/2015  ? Years since quitting: 6.0  ?Smokeless Tobacco Never  ? ?Counseling given: Not Answered ? ? ?Outpatient Medications Prior to Visit  ?Medication Sig Dispense Refill  ? acetaminophen (TYLENOL) 500 MG tablet Take 500 mg by mouth every 8 (eight) hours as needed for moderate pain.    ? albuterol (PROVENTIL) (2.5 MG/3ML) 0.083% nebulizer solution Take 2.5 mg by nebulization  every 6 (six) hours as needed for wheezing or shortness of breath.    ? albuterol (VENTOLIN HFA) 108 (90 Base) MCG/ACT inhaler Inhale 2 puffs into the lungs every 4 (four) hours as needed for wheezing or shortness of breath.    ? aspirin EC 81 MG tablet Take 81 mg by mouth daily. Swallow whole.    ? atorvastatin (LIPITOR) 80 MG tablet Take 1 tablet (80 mg total) by mouth at bedtime. 30 tablet 1  ? benzonatate (TESSALON) 200 MG capsule Take 1 capsule (200 mg total) by mouth 3 (three) times daily as needed for cough. 30 capsule 1  ? Cholecalciferol (VITAMIN D) 50 MCG (2000 UT) tablet Take 2,000 Units by mouth daily.    ? clopidogrel (PLAVIX) 75 MG tablet Take 1 tablet (75 mg total) by mouth daily. 30 tablet 5  ? diclofenac sodium (VOLTAREN) 1 % GEL Apply 2 g topically 4 (four) times daily as needed (pain).     ? fluticasone-salmeterol (ADVAIR) 250-50 MCG/ACT AEPB Inhale 1 puff into the lungs in the morning and at bedtime.    ? gabapentin (NEURONTIN) 800 MG tablet Take 1 tablet (800 mg total) by mouth 3 (three) times daily. 90 tablet 3  ? lidocaine (LIDODERM) 5 % Place 1 patch onto the skin daily as needed (pain).     ? methocarbamol (ROBAXIN) 500 MG tablet Take 1 tablet (500 mg total) by mouth 2 (two) times daily as needed for muscle spasms. 60 tablet 1  ? metoprolol tartrate (LOPRESSOR) 25 MG tablet Take 12.5 mg by mouth daily.    ? Multiple Vitamins-Minerals (MULTIVITAMIN WITH MINERALS) tablet Take 1 tablet by mouth daily.    ? Propylene Glycol (SYSTANE COMPLETE) 0.6 % SOLN Place 1 drop into both eyes daily as needed (dry eyes).    ? sertraline (ZOLOFT) 100 MG tablet Place 2 tablets (200 mg total) into feeding tube daily. (Patient taking differently: Take 200 mg by mouth daily.) 30 tablet 0  ? Tiotropium Bromide Monohydrate (SPIRIVA RESPIMAT) 2.5 MCG/ACT AERS Inhale 2 puffs into the lungs every morning.    ? traZODone (DESYREL) 50 MG tablet Place 1 tablet (50 mg total) into feeding tube at bedtime. (Patient taking  differently: Take 50 mg by mouth at bedtime.) 30 tablet 1  ? atorvastatin (LIPITOR) 80 MG tablet Take 1 tablet by mouth daily. 90 tablet 3  ? bacitracin-polymyxin b (POLYSPORIN) ointment Apply 1 application topically 2 (two) times daily. (Patient not taking: Reported on 05/07/2021)    ? diclofenac (VOLTAREN) 50 MG EC tablet Take 50 mg by mouth 2 (two) times daily as needed for moderate pain. (Patient not taking: Reported on 05/07/2021)    ? ferrous sulfate 325 (65 FE) MG tablet Take 325 mg by mouth daily with breakfast. (Patient not taking: Reported on 05/07/2021)    ? pyridoxine (B-6) 100 MG tablet Take 100 mg by mouth daily. (Patient not taking: Reported on 05/07/2021)    ? traMADol (ULTRAM) 50 MG tablet Take 1 tablet (50 mg total) by mouth every 6 (six) hours as needed. (Patient not taking:  Reported on 05/07/2021) 20 tablet 0  ? ?No facility-administered medications prior to visit.  ? ? ? ?Review of Systems:  ? ?Constitutional:   No  weight loss, night sweats,  Fevers, chills,  ?+fatigue, or  lassitude. ? ?HEENT:   No headaches,  Difficulty swallowing,  Tooth/dental problems, or  Sore throat,  ?              No sneezing, itching, ear ache, +nasal congestion, post nasal drip,  ? ?CV:  No chest pain,  Orthopnea, PND, swelling in lower extremities, anasarca, dizziness, palpitations, syncope.  ? ?GI  No heartburn, indigestion, abdominal pain, nausea, vomiting, diarrhea, change in bowel habits, loss of appetite, bloody stools.  ? ?Resp:   No chest wall deformity ? ?Skin: no rash or lesions. ? ?GU: no dysuria, change in color of urine, no urgency or frequency.  No flank pain, no hematuria  ? ?MS:  No joint pain or swelling.  No decreased range of motion.  No back pain. ? ? ? ?Physical Exam ? ?BP (!) 116/50 (BP Location: Left Arm, Patient Position: Sitting, Cuff Size: Normal)   Pulse 66   Temp 97.6 ?F (36.4 ?C) (Oral)   Ht 5\' 10"  (1.778 m)   Wt 162 lb 3.2 oz (73.6 kg)   SpO2 93%   BMI 23.27 kg/m?  ? ?GEN: A/Ox3;  pleasant , NAD, well nourished, thin ?  ?HEENT:  [place]/AT,   NOSE-clear, THROAT-clear, no lesions, no postnasal drip or exudate noted.  ?Herpetic lesion along the lower lip. ? ?NECK:  Supple w/ fair ROM; no JVD; normal

## 2021-05-07 NOTE — Patient Instructions (Addendum)
Begin Levaquin '500mg'$  daily for 7 days , take with food  ?Mucinex DM Twice daily Twice daily  As needed  cough/congestion  ?Continue on Advair and Spiriva .  ?Labs today .  ?Sputum culture  ?Valtrex 1 tab Twice daily  for 2 days for cold sores.  ?Albuterol inhaler or neb As needed   ?Call back if bloody mucus does not improve or worsen. If worsens will need to go to  ?ER .  ?Follow up in 2 weeks with Dr. Vaughan Browner or Timmey Lamba NP and As needed   ?Please contact office for sooner follow up if symptoms do not improve or worsen or seek emergency care  ? ? ?

## 2021-05-07 NOTE — Assessment & Plan Note (Signed)
Recurrent hemoptysis in the setting of possible pneumonia.  Also is on Plavix and aspirin over the last 3 months. ?At this time, will not hold Plavix or aspirin since he recently had stent placement.  However if hemoptysis continue may need to contact vascular to determine if we can hold briefly ?Check CBC today. ?Long discussion with patient and wife if hemoptysis persist or worsens they are to call us sooner follow-up or go to the emergency room ? ?Plan ?Patient Instructions  ?Begin Levaquin '500mg'$  daily for 7 days , take with food  ?Mucinex DM Twice daily Twice daily  As needed  cough/congestion  ?Continue on Advair and Spiriva .  ?Labs today .  ?Sputum culture  ?Valtrex 1 tab Twice daily  for 2 days for cold sores.  ?Albuterol inhaler or neb As needed   ?Call back if bloody mucus does not improve or worsen. If worsens will need to go to  ?ER .  ?Follow up in 2 weeks with Dr. Vaughan Browner or Candido Flott NP and As needed   ?Please contact office for sooner follow up if symptoms do not improve or worsen or seek emergency care  ? ? ?  ? ?

## 2021-05-07 NOTE — Assessment & Plan Note (Signed)
Acute COPD exacerbation and bronchiectatic flare.  Plus or minus recurrent pneumonia. ?CT chest does show increased consolidation along the left lower lobe.  Also some debris in the right bronchus. ?We will treat with empiric antibiotics.  Check sputum culture. ?Continue mucolytic's and mucociliary clearance. ?Close follow-up as patient has hemoptysis.  We will check CBC. ?Check chest x-ray on follow-up visit ? ?Plan  ?Patient Instructions  ?Begin Levaquin '500mg'$  daily for 7 days , take with food  ?Mucinex DM Twice daily Twice daily  As needed  cough/congestion  ?Continue on Advair and Spiriva .  ?Labs today .  ?Sputum culture  ?Valtrex 1 tab Twice daily  for 2 days for cold sores.  ?Albuterol inhaler or neb As needed   ?Call back if bloody mucus does not improve or worsen. If worsens will need to go to  ?ER .  ?Follow up in 2 weeks with Dr. Vaughan Browner or Chistopher Mangino NP and As needed   ?Please contact office for sooner follow up if symptoms do not improve or worsen or seek emergency care  ? ? ?  ? ?

## 2021-05-11 NOTE — Progress Notes (Signed)
Called and spoke with patient's wife, [name] ([place]), provided results/recommendations per Rexene Edison NP.  She verbalized understanding.  She said he is doing better.  Nothing further needed.

## 2021-05-12 LAB — EXTRA SPECIMEN

## 2021-05-12 LAB — RESPIRATORY CULTURE OR RESPIRATORY AND SPUTUM CULTURE

## 2021-05-19 ENCOUNTER — Other Ambulatory Visit: Payer: Self-pay

## 2021-05-19 ENCOUNTER — Inpatient Hospital Stay (HOSPITAL_COMMUNITY)
Admission: EM | Admit: 2021-05-19 | Discharge: 2021-06-02 | DRG: 466 | Disposition: A | Discharge status: Skilled Nursing Facility | Payer: No Typology Code available for payment source | Attending: Family Medicine | Admitting: Family Medicine

## 2021-05-19 ENCOUNTER — Emergency Department (HOSPITAL_COMMUNITY): Payer: No Typology Code available for payment source

## 2021-05-19 DIAGNOSIS — S72301A Unspecified fracture of shaft of right femur, initial encounter for closed fracture: Secondary | ICD-10-CM | POA: Diagnosis present

## 2021-05-19 DIAGNOSIS — Z808 Family history of malignant neoplasm of other organs or systems: Secondary | ICD-10-CM

## 2021-05-19 DIAGNOSIS — R1312 Dysphagia, oropharyngeal phase: Secondary | ICD-10-CM | POA: Diagnosis not present

## 2021-05-19 DIAGNOSIS — Z7982 Long term (current) use of aspirin: Secondary | ICD-10-CM

## 2021-05-19 DIAGNOSIS — F0153 Vascular dementia, unspecified severity, with mood disturbance: Secondary | ICD-10-CM | POA: Diagnosis present

## 2021-05-19 DIAGNOSIS — J9622 Acute and chronic respiratory failure with hypercapnia: Secondary | ICD-10-CM | POA: Diagnosis not present

## 2021-05-19 DIAGNOSIS — Z820 Family history of epilepsy and other diseases of the nervous system: Secondary | ICD-10-CM

## 2021-05-19 DIAGNOSIS — D62 Acute posthemorrhagic anemia: Secondary | ICD-10-CM | POA: Diagnosis not present

## 2021-05-19 DIAGNOSIS — Z888 Allergy status to other drugs, medicaments and biological substances status: Secondary | ICD-10-CM

## 2021-05-19 DIAGNOSIS — J690 Pneumonitis due to inhalation of food and vomit: Secondary | ICD-10-CM | POA: Diagnosis not present

## 2021-05-19 DIAGNOSIS — S7291XA Unspecified fracture of right femur, initial encounter for closed fracture: Secondary | ICD-10-CM | POA: Diagnosis present

## 2021-05-19 DIAGNOSIS — S72001A Fracture of unspecified part of neck of right femur, initial encounter for closed fracture: Secondary | ICD-10-CM | POA: Diagnosis not present

## 2021-05-19 DIAGNOSIS — Z6823 Body mass index (BMI) 23.0-23.9, adult: Secondary | ICD-10-CM

## 2021-05-19 DIAGNOSIS — D649 Anemia, unspecified: Secondary | ICD-10-CM | POA: Diagnosis not present

## 2021-05-19 DIAGNOSIS — Z743 Need for continuous supervision: Secondary | ICD-10-CM | POA: Diagnosis not present

## 2021-05-19 DIAGNOSIS — M978XXA Periprosthetic fracture around other internal prosthetic joint, initial encounter: Secondary | ICD-10-CM | POA: Diagnosis not present

## 2021-05-19 DIAGNOSIS — N1831 Chronic kidney disease, stage 3a: Secondary | ICD-10-CM | POA: Diagnosis present

## 2021-05-19 DIAGNOSIS — M19042 Primary osteoarthritis, left hand: Secondary | ICD-10-CM | POA: Diagnosis present

## 2021-05-19 DIAGNOSIS — E8729 Other acidosis: Secondary | ICD-10-CM | POA: Diagnosis not present

## 2021-05-19 DIAGNOSIS — S7291XD Unspecified fracture of right femur, subsequent encounter for closed fracture with routine healing: Secondary | ICD-10-CM | POA: Diagnosis not present

## 2021-05-19 DIAGNOSIS — M6281 Muscle weakness (generalized): Secondary | ICD-10-CM | POA: Diagnosis not present

## 2021-05-19 DIAGNOSIS — L89899 Pressure ulcer of other site, unspecified stage: Secondary | ICD-10-CM | POA: Diagnosis present

## 2021-05-19 DIAGNOSIS — M19041 Primary osteoarthritis, right hand: Secondary | ICD-10-CM | POA: Diagnosis present

## 2021-05-19 DIAGNOSIS — Z7902 Long term (current) use of antithrombotics/antiplatelets: Secondary | ICD-10-CM

## 2021-05-19 DIAGNOSIS — R6521 Severe sepsis with septic shock: Secondary | ICD-10-CM | POA: Diagnosis not present

## 2021-05-19 DIAGNOSIS — R2689 Other abnormalities of gait and mobility: Secondary | ICD-10-CM | POA: Diagnosis not present

## 2021-05-19 DIAGNOSIS — E860 Dehydration: Secondary | ICD-10-CM | POA: Diagnosis present

## 2021-05-19 DIAGNOSIS — I959 Hypotension, unspecified: Secondary | ICD-10-CM | POA: Diagnosis not present

## 2021-05-19 DIAGNOSIS — Z902 Acquired absence of lung [part of]: Secondary | ICD-10-CM

## 2021-05-19 DIAGNOSIS — F0154 Vascular dementia, unspecified severity, with anxiety: Secondary | ICD-10-CM | POA: Diagnosis present

## 2021-05-19 DIAGNOSIS — W010XXA Fall on same level from slipping, tripping and stumbling without subsequent striking against object, initial encounter: Secondary | ICD-10-CM | POA: Diagnosis present

## 2021-05-19 DIAGNOSIS — D631 Anemia in chronic kidney disease: Secondary | ICD-10-CM | POA: Diagnosis present

## 2021-05-19 DIAGNOSIS — N179 Acute kidney failure, unspecified: Secondary | ICD-10-CM

## 2021-05-19 DIAGNOSIS — Z79899 Other long term (current) drug therapy: Secondary | ICD-10-CM

## 2021-05-19 DIAGNOSIS — J9601 Acute respiratory failure with hypoxia: Secondary | ICD-10-CM | POA: Diagnosis not present

## 2021-05-19 DIAGNOSIS — I10 Essential (primary) hypertension: Secondary | ICD-10-CM | POA: Diagnosis not present

## 2021-05-19 DIAGNOSIS — M9701XA Periprosthetic fracture around internal prosthetic right hip joint, initial encounter: Secondary | ICD-10-CM | POA: Diagnosis not present

## 2021-05-19 DIAGNOSIS — G9340 Encephalopathy, unspecified: Secondary | ICD-10-CM | POA: Diagnosis not present

## 2021-05-19 DIAGNOSIS — F419 Anxiety disorder, unspecified: Secondary | ICD-10-CM | POA: Diagnosis present

## 2021-05-19 DIAGNOSIS — R262 Difficulty in walking, not elsewhere classified: Secondary | ICD-10-CM | POA: Diagnosis not present

## 2021-05-19 DIAGNOSIS — J449 Chronic obstructive pulmonary disease, unspecified: Secondary | ICD-10-CM | POA: Diagnosis present

## 2021-05-19 DIAGNOSIS — N182 Chronic kidney disease, stage 2 (mild): Secondary | ICD-10-CM

## 2021-05-19 DIAGNOSIS — Z87891 Personal history of nicotine dependence: Secondary | ICD-10-CM

## 2021-05-19 DIAGNOSIS — L8990 Pressure ulcer of unspecified site, unspecified stage: Secondary | ICD-10-CM | POA: Diagnosis present

## 2021-05-19 DIAGNOSIS — E782 Mixed hyperlipidemia: Secondary | ICD-10-CM | POA: Diagnosis present

## 2021-05-19 DIAGNOSIS — R339 Retention of urine, unspecified: Secondary | ICD-10-CM | POA: Diagnosis not present

## 2021-05-19 DIAGNOSIS — D72829 Elevated white blood cell count, unspecified: Secondary | ICD-10-CM

## 2021-05-19 DIAGNOSIS — E43 Unspecified severe protein-calorie malnutrition: Secondary | ICD-10-CM | POA: Diagnosis present

## 2021-05-19 DIAGNOSIS — A419 Sepsis, unspecified organism: Secondary | ICD-10-CM | POA: Diagnosis not present

## 2021-05-19 DIAGNOSIS — Z96641 Presence of right artificial hip joint: Secondary | ICD-10-CM | POA: Diagnosis not present

## 2021-05-19 DIAGNOSIS — G9341 Metabolic encephalopathy: Secondary | ICD-10-CM | POA: Diagnosis not present

## 2021-05-19 DIAGNOSIS — E876 Hypokalemia: Secondary | ICD-10-CM | POA: Diagnosis not present

## 2021-05-19 DIAGNOSIS — M21379 Foot drop, unspecified foot: Secondary | ICD-10-CM | POA: Diagnosis not present

## 2021-05-19 DIAGNOSIS — R279 Unspecified lack of coordination: Secondary | ICD-10-CM | POA: Diagnosis not present

## 2021-05-19 DIAGNOSIS — J9621 Acute and chronic respiratory failure with hypoxia: Secondary | ICD-10-CM | POA: Diagnosis not present

## 2021-05-19 DIAGNOSIS — F32A Depression, unspecified: Secondary | ICD-10-CM | POA: Diagnosis present

## 2021-05-19 DIAGNOSIS — L89611 Pressure ulcer of right heel, stage 1: Secondary | ICD-10-CM | POA: Diagnosis present

## 2021-05-19 DIAGNOSIS — T84030A Mechanical loosening of internal right hip prosthetic joint, initial encounter: Secondary | ICD-10-CM | POA: Diagnosis not present

## 2021-05-19 DIAGNOSIS — Z781 Physical restraint status: Secondary | ICD-10-CM

## 2021-05-19 DIAGNOSIS — R41841 Cognitive communication deficit: Secondary | ICD-10-CM | POA: Diagnosis not present

## 2021-05-19 DIAGNOSIS — J9602 Acute respiratory failure with hypercapnia: Secondary | ICD-10-CM | POA: Diagnosis not present

## 2021-05-19 DIAGNOSIS — J438 Other emphysema: Secondary | ICD-10-CM | POA: Diagnosis not present

## 2021-05-19 DIAGNOSIS — Z7951 Long term (current) use of inhaled steroids: Secondary | ICD-10-CM

## 2021-05-19 LAB — CBC WITH DIFFERENTIAL/PLATELET
Abs Immature Granulocytes: 0.12 10*3/uL — ABNORMAL HIGH (ref 0.00–0.07)
Basophils Absolute: 0.1 10*3/uL (ref 0.0–0.1)
Basophils Relative: 0 %
Eosinophils Absolute: 0.1 10*3/uL (ref 0.0–0.5)
Eosinophils Relative: 1 %
HCT: 34.5 % — ABNORMAL LOW (ref 39.0–52.0)
Hemoglobin: 10.4 g/dL — ABNORMAL LOW (ref 13.0–17.0)
Immature Granulocytes: 1 %
Lymphocytes Relative: 9 %
Lymphs Abs: 1.4 10*3/uL (ref 0.7–4.0)
MCH: 27.8 pg (ref 26.0–34.0)
MCHC: 30.1 g/dL (ref 30.0–36.0)
MCV: 92.2 fL (ref 80.0–100.0)
Monocytes Absolute: 0.9 10*3/uL (ref 0.1–1.0)
Monocytes Relative: 6 %
Neutro Abs: 12 10*3/uL — ABNORMAL HIGH (ref 1.7–7.7)
Neutrophils Relative %: 83 %
Platelets: 207 10*3/uL (ref 150–400)
RBC: 3.74 MIL/uL — ABNORMAL LOW (ref 4.22–5.81)
RDW: 16.5 % — ABNORMAL HIGH (ref 11.5–15.5)
WBC: 14.6 10*3/uL — ABNORMAL HIGH (ref 4.0–10.5)
nRBC: 0 % (ref 0.0–0.2)

## 2021-05-19 LAB — BASIC METABOLIC PANEL
Anion gap: 9 (ref 5–15)
BUN: 25 mg/dL — ABNORMAL HIGH (ref 8–23)
CO2: 21 mmol/L — ABNORMAL LOW (ref 22–32)
Calcium: 8.2 mg/dL — ABNORMAL LOW (ref 8.9–10.3)
Chloride: 107 mmol/L (ref 98–111)
Creatinine, Ser: 1.4 mg/dL — ABNORMAL HIGH (ref 0.61–1.24)
GFR, Estimated: 51 mL/min — ABNORMAL LOW (ref >60)
Glucose, Bld: 89 mg/dL (ref 70–99)
Potassium: 4.6 mmol/L (ref 3.5–5.1)
Sodium: 137 mmol/L (ref 135–145)

## 2021-05-19 LAB — PROTIME-INR
INR: 1 (ref 0.8–1.2)
Prothrombin Time: 13.2 seconds (ref 11.4–15.2)

## 2021-05-19 MED ORDER — HYDROMORPHONE HCL 1 MG/ML IJ SOLN
0.5000 mg | INTRAMUSCULAR | Status: AC | PRN
  Administered 2021-05-19 – 2021-05-20 (×2): 0.5 mg via INTRAVENOUS
  Filled 2021-05-19 (×2): qty 1

## 2021-05-19 MED ORDER — HYDROMORPHONE HCL 1 MG/ML IJ SOLN
0.5000 mg | INTRAMUSCULAR | Status: AC | PRN
  Administered 2021-05-19 (×2): 0.5 mg via INTRAVENOUS
  Filled 2021-05-19 (×2): qty 1

## 2021-05-19 MED ORDER — ONDANSETRON HCL 4 MG/2ML IJ SOLN
4.0000 mg | Freq: Once | INTRAMUSCULAR | Status: AC
  Administered 2021-05-19: 4 mg via INTRAVENOUS
  Filled 2021-05-19: qty 2

## 2021-05-19 NOTE — Progress Notes (Signed)
Discussed patient with EDP. Patient had right total hip done at [facility] about 5 years ago now with acute periprosthetic hip fx requiring surgical fixation vs revision. Trauma specialists are currently out of town until next week and limited capacity at this time. Recommend transfer to [facility] as his index surgery was performed there. Patient agreeable to going back to [facility] for his surgery. ?

## 2021-05-19 NOTE — ED Notes (Signed)
[name] [phone] would like an update asap ?

## 2021-05-19 NOTE — ED Provider Notes (Signed)
?[facility] ?Provider Note ? ? ?CSN: [id number] ?Arrival date & time: 05/19/21  1533 ? ?  ? ?History ? ?Chief Complaint  ?Patient presents with  ? Fall  ? ? ?William Kirk is a 81 y.o. male. ? ? ?Fall ? ? ?Patient presents ED for evaluation after fall.  Patient was walking when he tripped going up steps.  Patient is up falling backwards landing on his right hip.  EMS reported the patient struck and hit his head.  Patient denies hitting his head to me.  He denies any loss of consciousness.  He is not having any trouble with headache or neck pain.  Patient states he is having pain in his right hip.  He was given fentanyl prior to arrival.  Patient states he was unable to stand due to the pain and discomfort in his right hip. ? ?Home Medications ?Prior to Admission medications   ?Medication Sig Start Date End Date Taking? Authorizing Provider  ?acetaminophen (TYLENOL) 500 MG tablet Take 500 mg by mouth every 8 (eight) hours as needed for moderate pain. 10/28/19   [provider]  ?albuterol (PROVENTIL) (2.5 MG/3ML) 0.083% nebulizer solution Take 2.5 mg by nebulization every 6 (six) hours as needed for wheezing or shortness of breath.    [provider]  ?albuterol (PROVENTIL) (2.5 MG/3ML) 0.083% nebulizer solution Take 3 mLs (2.5 mg total) by nebulization every 6 (six) hours as needed for wheezing or shortness of breath. 05/07/21   Parrett, Fonnie Mu, NP  ?albuterol (VENTOLIN HFA) 108 (90 Base) MCG/ACT inhaler Inhale 2 puffs into the lungs every 4 (four) hours as needed for wheezing or shortness of breath. 07/11/18   Hongalgi, Lenis Dickinson, MD  ?albuterol (VENTOLIN HFA) 108 (90 Base) MCG/ACT inhaler 2 puffs every 4-6 hours as needed up to 4 times per day as rescue inhaler. 05/07/21   Parrett, Fonnie Mu, NP  ?aspirin EC 81 MG tablet Take 81 mg by mouth daily. Swallow whole.    [provider]  ?atorvastatin (LIPITOR) 80 MG tablet Take 1 tablet (80 mg total) by mouth  at bedtime. 02/25/21   Ulyses Amor, PA-C  ?benzonatate (TESSALON) 200 MG capsule Take 1 capsule (200 mg total) by mouth 3 (three) times daily as needed for cough. 02/17/21   Marshell Garfinkel, MD  ?Cholecalciferol (VITAMIN D) 50 MCG (2000 UT) tablet Take 2,000 Units by mouth daily.    [provider]  ?clopidogrel (PLAVIX) 75 MG tablet Take 1 tablet (75 mg total) by mouth daily. 01/12/21   Cherre Robins, MD  ?diclofenac sodium (VOLTAREN) 1 % GEL Apply 2 g topically 4 (four) times daily as needed (pain).     [provider]  ?fluticasone-salmeterol (ADVAIR) 250-50 MCG/ACT AEPB Inhale 1 puff into the lungs in the morning and at bedtime.    [provider]  ?fluticasone-salmeterol (WIXELA INHUB) 250-50 MCG/ACT AEPB Inhale 1 puff into the lungs in the morning and at bedtime. 05/07/21   Parrett, Fonnie Mu, NP  ?gabapentin (NEURONTIN) 800 MG tablet Take 1 tablet (800 mg total) by mouth 3 (three) times daily. 12/28/20   Raulkar, Clide Deutscher, MD  ?lidocaine (LIDODERM) 5 % Place 1 patch onto the skin daily as needed (pain).     [provider]  ?methocarbamol (ROBAXIN) 500 MG tablet Take 1 tablet (500 mg total) by mouth 2 (two) times daily as needed for muscle spasms. 04/16/20   Jamse Arn, MD  ?metoprolol tartrate (LOPRESSOR) 25  MG tablet Take 12.5 mg by mouth daily.    [provider]  ?Multiple Vitamins-Minerals (MULTIVITAMIN WITH MINERALS) tablet Take 1 tablet by mouth daily.    [provider]  ?Propylene Glycol (SYSTANE COMPLETE) 0.6 % SOLN Place 1 drop into both eyes daily as needed (dry eyes).    [provider]  ?sertraline (ZOLOFT) 100 MG tablet Place 2 tablets (200 mg total) into feeding tube daily. ?Patient taking differently: Take 200 mg by mouth daily. 11/23/18   [name], PA-C  ?Tiotropium Bromide Monohydrate (SPIRIVA RESPIMAT) 2.5 MCG/ACT AERS Inhale 2 puffs into the lungs in the morning. 05/07/21   Parrett, Fonnie Mu, NP  ?traZODone  (DESYREL) 50 MG tablet Place 1 tablet (50 mg total) into feeding tube at bedtime. ?Patient taking differently: Take 50 mg by mouth at bedtime. 11/22/18   [name], PA-C  ?   ? ?Allergies    ?Testosterone   ? ?Review of Systems   ?Review of Systems  ?Constitutional:  Negative for fever.  ? ?Physical Exam ?Updated Vital Signs ?BP 113/65   Pulse 75   Temp 98.2 ?F (36.8 ?C) (Oral)   Resp 15   Ht 1.778 m ('5\' 10"'$ )   Wt 73.6 kg   SpO2 91%   BMI 23.27 kg/m?  ?Physical Exam ?Vitals and nursing note reviewed.  ?Constitutional:   ?   Appearance: He is well-developed. He is not diaphoretic.  ?HENT:  ?   Head: Normocephalic and atraumatic.  ?   Right Ear: External ear normal.  ?   Left Ear: External ear normal.  ?Eyes:  ?   General: No scleral icterus.    ?   Right eye: No discharge.     ?   Left eye: No discharge.  ?   Conjunctiva/sclera: Conjunctivae normal.  ?Neck:  ?   Trachea: No tracheal deviation.  ?Cardiovascular:  ?   Rate and Rhythm: Normal rate and regular rhythm.  ?Pulmonary:  ?   Effort: Pulmonary effort is normal. No respiratory distress.  ?   Breath sounds: Normal breath sounds. No stridor. No wheezing or rales.  ?Abdominal:  ?   General: Bowel sounds are normal. There is no distension.  ?   Palpations: Abdomen is soft.  ?   Tenderness: There is no abdominal tenderness. There is no guarding or rebound.  ?Musculoskeletal:     ?   General: No deformity.  ?   Cervical back: Normal and neck supple.  ?   Thoracic back: Normal.  ?   Lumbar back: Normal.  ?   Right hip: Tenderness and bony tenderness present. Decreased range of motion.  ?Skin: ?   General: Skin is warm and dry.  ?   Findings: No rash.  ?Neurological:  ?   General: No focal deficit present.  ?   Mental Status: He is alert.  ?   Cranial Nerves: No cranial nerve deficit (no facial droop, extraocular movements intact, no slurred speech).  ?   Sensory: No sensory deficit.  ?   Motor: No abnormal muscle tone or seizure activity.  ?   Coordination:  Coordination normal.  ?Psychiatric:     ?   Mood and Affect: Mood normal.  ? ? ?ED Results / Procedures / Treatments   ?Labs ?(all labs ordered are listed, but only abnormal results are displayed) ?Labs Reviewed  ?BASIC METABOLIC PANEL - Abnormal; Notable for the following components:  ?    Result Value  ? CO2  21 (*)   ? BUN 25 (*)   ? Creatinine, Ser 1.40 (*)   ? Calcium 8.2 (*)   ? GFR, Estimated 51 (*)   ? All other components within normal limits  ?CBC WITH DIFFERENTIAL/PLATELET - Abnormal; Notable for the following components:  ? WBC 14.6 (*)   ? RBC 3.74 (*)   ? Hemoglobin 10.4 (*)   ? HCT 34.5 (*)   ? RDW 16.5 (*)   ? Neutro Abs 12.0 (*)   ? Abs Immature Granulocytes 0.12 (*)   ? All other components within normal limits  ?PROTIME-INR  ?TYPE AND SCREEN  ? ? ?EKG ?EKG Interpretation ? ?Date/Time:  Wednesday May 19 2021 17:28:30 EDT ?Ventricular Rate:  74 ?PR Interval:  124 ?QRS Duration: 88 ?QT Interval:  400 ?QTC Calculation: 444 ?R Axis:   59 ?Text Interpretation: Normal sinus rhythm Normal ECG When compared with ECG of 22-Feb-2021 10:39, PREVIOUS ECG IS PRESENT No significant change since last tracing Confirmed by Dorie Rank [phone]) on 05/19/2021 6:13:39 PM ? ?Radiology ?DG Hip Unilat With Pelvis 2-3 Views Right ? ?Result Date: 05/19/2021 ?CLINICAL DATA:  Fall, pain EXAM: DG HIP (WITH OR WITHOUT PELVIS) 2-3V RIGHT COMPARISON:  None. FINDINGS: Status post right hip arthroplasty. There is a periprosthetic fracture about the inferolateral aspect of the femoral component with approximately 1 cortex width displacement. There is no appreciable fracture about the acetabular component. Ilioischial and iliopubic lines are maintained. Sacroiliac joint and pubic symphysis are intact. No appreciable fracture of the left hip. 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 Vascular calcifications. IMPRESSION: Periprosthetic right femoral fracture about the inferolateral aspect of the femoral component with  approximately one cortex width displacement. Electronically Signed   By: Keane Police D.O.   On: 05/19/2021 16:33   ? ?Procedures ?Procedures  ? ? ?Medications Ordered in ED ?Medications  ?HYDROmorphone (DILAUDID) injection

## 2021-05-19 NOTE — H&P (Signed)
?History and Physical  ? ? ?Patient: William Kirk OEV:[id number] DOB: 1940-05-21 ?DOA: 05/19/2021 ?DOS: the patient was seen and examined on 05/20/2021 ?PCP: Floreen Comber, PA-C  ?Patient coming from: Home ? ?Chief Complaint:  ?Chief Complaint  ?Patient presents with  ? Fall  ? ?HPI: William Kirk is a 81 y.o. male with medical history significant of PVD, COPD, hyperlipidemia, CKD 3A who presents to the emergency department via EMS from home after sustaining a mechanical fall PTA.  Patient tripped while going up the steps at home, he fell backwards landing on his right hip, he denies hitting his head and denies LOC.  He complained of right hip pain and difficulty in being able to bear weight on affected leg, EMS was activated and fentanyl was given prior to arrival.  He denies fever, chills, headache, blurry vision, chest pain, shortness of breath, abdominal pain ? ?ED Course:  ?In the emergency department, he was hemodynamically stable.  Work-up in the ED shows leukocytosis, and normocytic anemia, BUN/creatinine 25/1.40 (baseline creatinine is within baseline range). ?Right hip x-ray showed periprosthetic right femoral fracture about the inferolateral aspect of the femoral component with approximately one cortex width displacement. ?He was treated with IV Dilaudid and Zofran.  Orthopedic surgeon was consulted by ED physician and recommended admitting patient to the hospital with plan to follow-up with patient in the morning.  Hospitalist was asked to admit patient for further evaluation and management.  ? ?Review of Systems: ?Review of systems as noted in the HPI. All other systems reviewed and are negative. ? ? ?Past Medical History:  ?Diagnosis Date  ? Anxiety   ? Arthritis   ? "hands" (09/21/2016)  ? Bleeding stomach ulcer 2000s  ? COPD (chronic obstructive pulmonary disease) (HCC)   ? Cough   ? Depression   ? Dyspnea   ? Hypertension   ? Pneumonia   ? "now and several times before this" (09/21/2016)   ? PVD (peripheral vascular disease) (HCC) 2014  ? prev PTCA on RLE  ? ?Past Surgical History:  ?Procedure Laterality Date  ? ABDOMINAL AORTOGRAM W/LOWER EXTREMITY Bilateral 03/05/2021  ? Procedure: ABDOMINAL AORTOGRAM W/LOWER EXTREMITY;  Surgeon: Leonie Douglas, MD;  Location: [facility];  Service: Cardiovascular;  Laterality: Bilateral;  ? IR ANGIOGRAM SELECTIVE EACH ADDITIONAL VESSEL  07/26/2018  ? IR ANGIOGRAM SELECTIVE EACH ADDITIONAL VESSEL  07/26/2018  ? IR ANGIOGRAM SELECTIVE EACH ADDITIONAL VESSEL  07/26/2018  ? IR EMBO ART  VEN HEMORR LYMPH EXTRAV  INC GUIDE ROADMAPPING  07/26/2018  ? IR GASTROSTOMY TUBE MOD SED  12/14/2018  ? IR US GUIDE VASC ACCESS RIGHT  07/26/2018  ? JOINT REPLACEMENT    ? KNEE ARTHROSCOPY Right X 1  ? PERIPHERAL VASCULAR BALLOON ANGIOPLASTY  03/05/2021  ? Procedure: PERIPHERAL VASCULAR BALLOON ANGIOPLASTY;  Surgeon: Leonie Douglas, MD;  Location: [facility];  Service: Cardiovascular;;  DCB Popliteal  ? PERIPHERAL VASCULAR INTERVENTION  03/05/2021  ? Procedure: PERIPHERAL VASCULAR INTERVENTION;  Surgeon: Leonie Douglas, MD;  Location: [facility];  Service: Cardiovascular;;  ? TOTAL HIP ARTHROPLASTY Right 2009  ? TRANSCAROTID ARTERY REVASCULARIZATION?  Right 02/24/2021  ? Procedure: RIGHT TRANSCAROTID ARTERY REVASCULARIZATION;  Surgeon: Leonie Douglas, MD;  Location: [facility] OR;  Service: Vascular;  Laterality: Right;  ? ULTRASOUND GUIDANCE FOR VASCULAR ACCESS Left 02/24/2021  ? Procedure: ULTRASOUND GUIDANCE FOR VASCULAR ACCESS;  Surgeon: Leonie Douglas, MD;  Location: [facility] OR;  Service: Vascular;  Laterality: Left;  ?  VIDEO ASSISTED THORACOSCOPY (VATS)/ LOBECTOMY Right 10/18/2018  ? Procedure: VIDEO ASSISTED THORACOSCOPY (VATS)/ right lower LOBECTOMY;  Surgeon: Loreli Slot, MD;  Location: [facility] OR;  Service: Thoracic;  Laterality: Right;  ? VIDEO ASSISTED THORACOSCOPY (VATS)/DECORTICATION Right 11/09/2018  ? Procedure: REDO VIDEO ASSISTED THORACOSCOPY  (VATS)/DECORTICATION/DRAIN EFFUSION;  Surgeon: Loreli Slot, MD;  Location: [facility] OR;  Service: Thoracic;  Laterality: Right;  ? VIDEO BRONCHOSCOPY N/A 07/27/2018  ? Procedure: VIDEO BRONCHOSCOPY WITHOUT FLUORO;  Surgeon: Leslye Peer, MD;  Location: [facility] OR;  Service: Cardiopulmonary;  Laterality: N/A;  ? VIDEO BRONCHOSCOPY N/A 11/09/2018  ? Procedure: VIDEO BRONCHOSCOPY;  Surgeon: Loreli Slot, MD;  Location: [facility] OR;  Service: Thoracic;  Laterality: N/A;  ? ? ?Social History:  reports that he quit smoking about 6 years ago. His smoking use included cigarettes. He has a 55.00 pack-year smoking history. He has never used smokeless tobacco. He reports that he does not drink alcohol and does not use drugs. ? ? ?Allergies  ?Allergen Reactions  ? Testosterone Rash  ? ? ?Family History  ?Problem Relation Age of Onset  ? Cancer Father 62  ?     Brain  ? Dementia Mother   ?  ? ?Prior to Admission medications   ?Medication Sig Start Date End Date Taking? Authorizing Provider  ?acetaminophen (TYLENOL) 500 MG tablet Take 500 mg by mouth every 8 (eight) hours as needed for moderate pain. 10/28/19   [provider]  ?albuterol (PROVENTIL) (2.5 MG/3ML) 0.083% nebulizer solution Take 2.5 mg by nebulization every 6 (six) hours as needed for wheezing or shortness of breath.    [provider]  ?albuterol (PROVENTIL) (2.5 MG/3ML) 0.083% nebulizer solution Take 3 mLs (2.5 mg total) by nebulization every 6 (six) hours as needed for wheezing or shortness of breath. 05/07/21   Parrett, Virgel Bouquet, NP  ?albuterol (VENTOLIN HFA) 108 (90 Base) MCG/ACT inhaler Inhale 2 puffs into the lungs every 4 (four) hours as needed for wheezing or shortness of breath. 07/11/18   Hongalgi, Maximino Greenland, MD  ?albuterol (VENTOLIN HFA) 108 (90 Base) MCG/ACT inhaler 2 puffs every 4-6 hours as needed up to 4 times per day as rescue inhaler. 05/07/21   Parrett, Virgel Bouquet, NP  ?aspirin EC 81 MG tablet Take 81 mg by mouth daily. Swallow whole.     [provider]  ?atorvastatin (LIPITOR) 80 MG tablet Take 1 tablet (80 mg total) by mouth at bedtime. 02/25/21   Lars Mage, PA-C  ?benzonatate (TESSALON) 200 MG capsule Take 1 capsule (200 mg total) by mouth 3 (three) times daily as needed for cough. 02/17/21   Chilton Greathouse, MD  ?Cholecalciferol (VITAMIN D) 50 MCG (2000 UT) tablet Take 2,000 Units by mouth daily.    [provider]  ?clopidogrel (PLAVIX) 75 MG tablet Take 1 tablet (75 mg total) by mouth daily. 01/12/21   Leonie Douglas, MD  ?diclofenac sodium (VOLTAREN) 1 % GEL Apply 2 g topically 4 (four) times daily as needed (pain).     [provider]  ?fluticasone-salmeterol (ADVAIR) 250-50 MCG/ACT AEPB Inhale 1 puff into the lungs in the morning and at bedtime.    [provider]  ?fluticasone-salmeterol (WIXELA INHUB) 250-50 MCG/ACT AEPB Inhale 1 puff into the lungs in the morning and at bedtime. 05/07/21   Parrett, Virgel Bouquet, NP  ?gabapentin (NEURONTIN) 800 MG tablet Take 1 tablet (800 mg total) by mouth 3 (three) times daily. 12/28/20   Horton Chin, MD  ?  lidocaine (LIDODERM) 5 % Place 1 patch onto the skin daily as needed (pain).     [provider]  ?methocarbamol (ROBAXIN) 500 MG tablet Take 1 tablet (500 mg total) by mouth 2 (two) times daily as needed for muscle spasms. 04/16/20   Marcello Fennel, MD  ?metoprolol tartrate (LOPRESSOR) 25 MG tablet Take 12.5 mg by mouth daily.    [provider]  ?Multiple Vitamins-Minerals (MULTIVITAMIN WITH MINERALS) tablet Take 1 tablet by mouth daily.    [provider]  ?Propylene Glycol (SYSTANE COMPLETE) 0.6 % SOLN Place 1 drop into both eyes daily as needed (dry eyes).    [provider]  ?sertraline (ZOLOFT) 100 MG tablet Place 2 tablets (200 mg total) into feeding tube daily. ?Patient taking differently: Take 200 mg by mouth daily. 11/23/18   Sharlene Dory, PA-C  ?Tiotropium Bromide Monohydrate (SPIRIVA RESPIMAT) 2.5 MCG/ACT  AERS Inhale 2 puffs into the lungs in the morning. 05/07/21   Parrett, Virgel Bouquet, NP  ?traZODone (DESYREL) 50 MG tablet Place 1 tablet (50 mg total) into feeding tube at bedtime. ?Patient taking differently: Take 50

## 2021-05-19 NOTE — ED Triage Notes (Addendum)
Pt bib [facility] from home after mechanical fall from standing position. Pt landed on right hip and laid backwards. No loc. NO head trauma. Pt c/o pain on R hip. On plavix. C collar in place on arrival. 200 mcg fentanyl given PTA ? ?

## 2021-05-19 NOTE — ED Notes (Signed)
Updated pt's wife about condition and plan.  ?

## 2021-05-19 NOTE — ED Notes (Signed)
[employer] called again, due to no call back from 1st call ?

## 2021-05-19 NOTE — ED Notes (Signed)
[employer] [employer] called for Dr. Tomi Bamberger ?

## 2021-05-20 ENCOUNTER — Inpatient Hospital Stay (HOSPITAL_COMMUNITY): Payer: No Typology Code available for payment source

## 2021-05-20 DIAGNOSIS — S7291XD Unspecified fracture of right femur, subsequent encounter for closed fracture with routine healing: Secondary | ICD-10-CM

## 2021-05-20 DIAGNOSIS — E782 Mixed hyperlipidemia: Secondary | ICD-10-CM

## 2021-05-20 DIAGNOSIS — E860 Dehydration: Secondary | ICD-10-CM

## 2021-05-20 DIAGNOSIS — N1831 Chronic kidney disease, stage 3a: Secondary | ICD-10-CM

## 2021-05-20 DIAGNOSIS — N182 Chronic kidney disease, stage 2 (mild): Secondary | ICD-10-CM

## 2021-05-20 DIAGNOSIS — D72829 Elevated white blood cell count, unspecified: Secondary | ICD-10-CM

## 2021-05-20 LAB — COMPREHENSIVE METABOLIC PANEL
ALT: 19 U/L (ref 0–44)
AST: 32 U/L (ref 15–41)
Albumin: 3.4 g/dL — ABNORMAL LOW (ref 3.5–5.0)
Alkaline Phosphatase: 110 U/L (ref 38–126)
Anion gap: 7 (ref 5–15)
BUN: 27 mg/dL — ABNORMAL HIGH (ref 8–23)
CO2: 25 mmol/L (ref 22–32)
Calcium: 8.7 mg/dL — ABNORMAL LOW (ref 8.9–10.3)
Chloride: 104 mmol/L (ref 98–111)
Creatinine, Ser: 1.47 mg/dL — ABNORMAL HIGH (ref 0.61–1.24)
GFR, Estimated: 49 mL/min — ABNORMAL LOW (ref >60)
Glucose, Bld: 85 mg/dL (ref 70–99)
Potassium: 4.9 mmol/L (ref 3.5–5.1)
Sodium: 136 mmol/L (ref 135–145)
Total Bilirubin: 1 mg/dL (ref 0.3–1.2)
Total Protein: 6.7 g/dL (ref 6.5–8.1)

## 2021-05-20 LAB — CBC
HCT: 32.8 % — ABNORMAL LOW (ref 39.0–52.0)
Hemoglobin: 10.2 g/dL — ABNORMAL LOW (ref 13.0–17.0)
MCH: 27.5 pg (ref 26.0–34.0)
MCHC: 31.1 g/dL (ref 30.0–36.0)
MCV: 88.4 fL (ref 80.0–100.0)
Platelets: 227 10*3/uL (ref 150–400)
RBC: 3.71 MIL/uL — ABNORMAL LOW (ref 4.22–5.81)
RDW: 16.4 % — ABNORMAL HIGH (ref 11.5–15.5)
WBC: 12.2 10*3/uL — ABNORMAL HIGH (ref 4.0–10.5)
nRBC: 0 % (ref 0.0–0.2)

## 2021-05-20 LAB — PHOSPHORUS: Phosphorus: 4.1 mg/dL (ref 2.5–4.6)

## 2021-05-20 LAB — MAGNESIUM: Magnesium: 2.2 mg/dL (ref 1.7–2.4)

## 2021-05-20 MED ORDER — ACETAMINOPHEN 325 MG PO TABS: 650.0000 mg | ORAL_TABLET | Freq: Four times a day (QID) | ORAL | Status: DC | PRN

## 2021-05-20 MED ORDER — ENOXAPARIN SODIUM 40 MG/0.4ML IJ SOSY
40.0000 mg | PREFILLED_SYRINGE | Freq: Every day | INTRAMUSCULAR | Status: DC
  Administered 2021-05-20: 40 mg via SUBCUTANEOUS
  Filled 2021-05-20: qty 0.4

## 2021-05-20 MED ORDER — METOPROLOL TARTRATE 12.5 MG HALF TABLET
12.5000 mg | ORAL_TABLET | Freq: Every day | ORAL | Status: DC
  Administered 2021-05-23: 12.5 mg via ORAL
  Filled 2021-05-20 (×2): qty 1

## 2021-05-20 MED ORDER — ASPIRIN EC 81 MG PO TBEC
81.0000 mg | DELAYED_RELEASE_TABLET | Freq: Every day | ORAL | Status: DC
  Administered 2021-05-20 – 2021-05-22 (×2): 81 mg via ORAL
  Filled 2021-05-20 (×2): qty 1

## 2021-05-20 MED ORDER — TRAZODONE HCL 50 MG PO TABS
50.0000 mg | ORAL_TABLET | Freq: Every day | ORAL | Status: DC
  Administered 2021-05-20: 50 mg via ORAL
  Filled 2021-05-20: qty 1

## 2021-05-20 MED ORDER — MORPHINE SULFATE (PF) 4 MG/ML IV SOLN
4.0000 mg | INTRAVENOUS | Status: DC | PRN
  Administered 2021-05-20 – 2021-05-21 (×2): 4 mg via INTRAVENOUS
  Filled 2021-05-20 (×2): qty 1

## 2021-05-20 MED ORDER — ATORVASTATIN CALCIUM 80 MG PO TABS
80.0000 mg | ORAL_TABLET | Freq: Every day | ORAL | Status: DC
  Administered 2021-05-20 – 2021-06-01 (×10): 80 mg via ORAL
  Filled 2021-05-20 (×5): qty 1
  Filled 2021-05-20: qty 2
  Filled 2021-05-20 (×5): qty 1

## 2021-05-20 MED ORDER — ALBUTEROL SULFATE (2.5 MG/3ML) 0.083% IN NEBU: 2.5000 mg | INHALATION_SOLUTION | Freq: Four times a day (QID) | RESPIRATORY_TRACT | Status: DC | PRN

## 2021-05-20 MED ORDER — CHLORHEXIDINE GLUCONATE CLOTH 2 % EX PADS
6.0000 | MEDICATED_PAD | Freq: Every day | CUTANEOUS | Status: DC
  Administered 2021-05-21 – 2021-05-24 (×2): 6 via TOPICAL

## 2021-05-20 MED ORDER — MORPHINE SULFATE (PF) 2 MG/ML IV SOLN
2.0000 mg | INTRAVENOUS | Status: DC | PRN
  Administered 2021-05-20 (×2): 2 mg via INTRAVENOUS
  Filled 2021-05-20 (×2): qty 1

## 2021-05-20 MED ORDER — GABAPENTIN 400 MG PO CAPS
800.0000 mg | ORAL_CAPSULE | Freq: Two times a day (BID) | ORAL | Status: DC
  Administered 2021-05-20: 800 mg via ORAL
  Filled 2021-05-20 (×3): qty 2

## 2021-05-20 MED ORDER — HALOPERIDOL LACTATE 5 MG/ML IJ SOLN
5.0000 mg | Freq: Four times a day (QID) | INTRAMUSCULAR | Status: DC | PRN
  Administered 2021-05-20: 5 mg via INTRAVENOUS
  Filled 2021-05-20: qty 1

## 2021-05-20 MED ORDER — MOMETASONE FURO-FORMOTEROL FUM 200-5 MCG/ACT IN AERO
2.0000 | INHALATION_SPRAY | Freq: Two times a day (BID) | RESPIRATORY_TRACT | Status: DC
  Administered 2021-05-20 – 2021-06-02 (×23): 2 via RESPIRATORY_TRACT
  Filled 2021-05-20 (×3): qty 8.8

## 2021-05-20 MED ORDER — SERTRALINE HCL 50 MG PO TABS
200.0000 mg | ORAL_TABLET | Freq: Every day | ORAL | Status: DC
  Administered 2021-05-20: 200 mg
  Filled 2021-05-20: qty 2

## 2021-05-20 MED ORDER — SODIUM CHLORIDE 0.9 % IV SOLN: INTRAVENOUS | Status: AC

## 2021-05-20 MED ORDER — OXYCODONE HCL 5 MG PO TABS
10.0000 mg | ORAL_TABLET | ORAL | Status: DC | PRN
Start: 2021-05-20 — End: 2021-05-21
  Administered 2021-05-20: 10 mg via ORAL
  Filled 2021-05-20: qty 2

## 2021-05-20 MED ORDER — UMECLIDINIUM BROMIDE 62.5 MCG/ACT IN AEPB
1.0000 | INHALATION_SPRAY | Freq: Every morning | RESPIRATORY_TRACT | Status: DC
  Administered 2021-05-21: 1 via RESPIRATORY_TRACT
  Filled 2021-05-20: qty 7

## 2021-05-20 MED ORDER — ACETAMINOPHEN 650 MG RE SUPP: 650.0000 mg | Freq: Four times a day (QID) | RECTAL | Status: DC | PRN

## 2021-05-20 MED ORDER — METHOCARBAMOL 500 MG PO TABS: 500.0000 mg | ORAL_TABLET | Freq: Two times a day (BID) | ORAL | Status: DC | PRN

## 2021-05-20 NOTE — TOC CAGE-AID Note (Signed)
Transition of Care (TOC) - CAGE-AID Screening ? ? ?Patient Details  ?Name: William Kirk ?MRN: [id number] ?Date of Birth: August 09, 1940 ? ?Transition of Care (TOC) CM/SW Contact:    ?Manasvi Dickard C Tarpley-Carter, LCSWA ?Phone Number: ?05/20/2021, 12:35 PM ? ? ?Clinical Narrative: ?Pt is unable to participate in Cage Aid. ?Pt is confused. ? ?Passenger transport manager, MSW, LCSW-A ?Pronouns:  She/Her/Hers ?[facility]Transitions of Care ?Clinical Social Worker ?Direct Number:  [phone] ?[email or web address] ? ?CAGE-AID Screening: ?Substance Abuse Screening unable to be completed due to: : Patient unable to participate ? ?  ?  ?  ?  ?  ? ?  ? ?  ? ? ? ? ? ? ?

## 2021-05-20 NOTE — ED Notes (Signed)
Pt confused at this time, refused PO meds, stated "I will wait for my wife to tell you what I take." Reoriented pt but unsuccessful, pt continues to refuse meds. Pt grabbing right hip and grimacing. RN asked Dr Cruzita Lederer for increased pain medication. ?

## 2021-05-20 NOTE — ED Notes (Signed)
Pt has been much calmer since haldol admin, able to rest comfortably in bed, not throwing things or violent with staff. ?

## 2021-05-20 NOTE — Consult Note (Signed)
Reason for Consult:Right hip fx ?Referring Physician: Marzetta Board ?Time called: 1104 ?Time at bedside: 1224 ? ? ?William Kirk is an 81 y.o. male.  ?HPI: [name] fell backwards while going up some steps at home. He had immediate right hip pain and could not get up. He was brought to the ED where x-rays showed a right periprosthetic hip fx and orthopedic surgery was consulted. Attempts were made to have pt transferred to [facility] as his original surgery was done there and it was long enough ago that we didn't have access to records through care everywhere but was refused. He is confused currently and cannot contribute meaningfully to history. ? ?Past Medical History:  ?Diagnosis Date  ? Anxiety   ? Arthritis   ? "hands" (09/21/2016)  ? Bleeding stomach ulcer 2000s  ? COPD (chronic obstructive pulmonary disease) ([place])   ? Cough   ? Depression   ? Dyspnea   ? Hypertension   ? Pneumonia   ? "now and several times before this" (09/21/2016)  ? PVD (peripheral vascular disease) ([place]) 2014  ? prev PTCA on RLE  ? ? ?Past Surgical History:  ?Procedure Laterality Date  ? ABDOMINAL AORTOGRAM W/LOWER EXTREMITY Bilateral 03/05/2021  ? Procedure: ABDOMINAL AORTOGRAM W/LOWER EXTREMITY;  Surgeon: Cherre Robins, MD;  Location: [place] [facility];  Service: Cardiovascular;  Laterality: Bilateral;  ? IR ANGIOGRAM SELECTIVE EACH ADDITIONAL VESSEL  07/26/2018  ? IR ANGIOGRAM SELECTIVE EACH ADDITIONAL VESSEL  07/26/2018  ? IR ANGIOGRAM SELECTIVE EACH ADDITIONAL VESSEL  07/26/2018  ? IR EMBO ART  VEN HEMORR LYMPH EXTRAV  INC GUIDE ROADMAPPING  07/26/2018  ? IR GASTROSTOMY TUBE MOD SED  12/14/2018  ? IR US GUIDE VASC ACCESS RIGHT  07/26/2018  ? JOINT REPLACEMENT    ? KNEE ARTHROSCOPY Right X 1  ? PERIPHERAL VASCULAR BALLOON ANGIOPLASTY  03/05/2021  ? Procedure: PERIPHERAL VASCULAR BALLOON ANGIOPLASTY;  Surgeon: Cherre Robins, MD;  Location: [place] [facility];  Service: Cardiovascular;;  DCB Popliteal  ? PERIPHERAL VASCULAR INTERVENTION   03/05/2021  ? Procedure: PERIPHERAL VASCULAR INTERVENTION;  Surgeon: Cherre Robins, MD;  Location: [place] [facility];  Service: Cardiovascular;;  ? TOTAL HIP ARTHROPLASTY Right 2009  ? TRANSCAROTID ARTERY REVASCULARIZATION?  Right 02/24/2021  ? Procedure: RIGHT TRANSCAROTID ARTERY REVASCULARIZATION;  Surgeon: Cherre Robins, MD;  Location: [facility] OR;  Service: Vascular;  Laterality: Right;  ? ULTRASOUND GUIDANCE FOR VASCULAR ACCESS Left 02/24/2021  ? Procedure: ULTRASOUND GUIDANCE FOR VASCULAR ACCESS;  Surgeon: Cherre Robins, MD;  Location: [place];  Service: Vascular;  Laterality: Left;  ? VIDEO ASSISTED THORACOSCOPY (VATS)/ LOBECTOMY Right 10/18/2018  ? Procedure: VIDEO ASSISTED THORACOSCOPY (VATS)/ right lower LOBECTOMY;  Surgeon: Melrose Nakayama, MD;  Location: [place];  Service: Thoracic;  Laterality: Right;  ? VIDEO ASSISTED THORACOSCOPY (VATS)/DECORTICATION Right 11/09/2018  ? Procedure: REDO VIDEO ASSISTED THORACOSCOPY (VATS)/DECORTICATION/DRAIN EFFUSION;  Surgeon: Melrose Nakayama, MD;  Location: [place];  Service: Thoracic;  Laterality: Right;  ? VIDEO BRONCHOSCOPY N/A 07/27/2018  ? Procedure: VIDEO BRONCHOSCOPY WITHOUT FLUORO;  Surgeon: Collene Gobble, MD;  Location: [place];  Service: Cardiopulmonary;  Laterality: N/A;  ? VIDEO BRONCHOSCOPY N/A 11/09/2018  ? Procedure: VIDEO BRONCHOSCOPY;  Surgeon: Melrose Nakayama, MD;  Location: [place];  Service: Thoracic;  Laterality: N/A;  ? ? ?Family History  ?Problem Relation Age of Onset  ? Cancer Father 70  ?     Brain  ? Dementia Mother   ? ? ?Social  History:  reports that he quit smoking about 6 years ago. His smoking use included cigarettes. He has a 55.00 pack-year smoking history. He has never used smokeless tobacco. He reports that he does not drink alcohol and does not use drugs. ? ?Allergies:  ?Allergies  ?Allergen Reactions  ? Testosterone Rash  ? ? ?Medications: I have reviewed the patient's current medications. ? ?Results for orders placed or  performed during the hospital encounter of 05/19/21 (from the past 48 hour(s))  ?Basic metabolic panel     Status: Abnormal  ? Collection Time: 05/19/21  4:45 PM  ?Result Value Ref Range  ? Sodium 137 135 - 145 mmol/L  ? Potassium 4.6 3.5 - 5.1 mmol/L  ? Chloride 107 98 - 111 mmol/L  ? CO2 21 (L) 22 - 32 mmol/L  ? Glucose, Bld 89 70 - 99 mg/dL  ?  Comment: Glucose reference range applies only to samples taken after fasting for at least 8 hours.  ? BUN 25 (H) 8 - 23 mg/dL  ? Creatinine, Ser 1.40 (H) 0.61 - 1.24 mg/dL  ? Calcium 8.2 (L) 8.9 - 10.3 mg/dL  ? GFR, Estimated 51 (L) >60 mL/min  ?  Comment: (NOTE) ?Calculated using the CKD-EPI Creatinine Equation (2021) ?  ? Anion gap 9 5 - 15  ?  Comment: Performed at [facility], [place] [address] [place], [place] [address]  ?CBC with Differential     Status: Abnormal  ? Collection Time: 05/19/21  4:45 PM  ?Result Value Ref Range  ? WBC 14.6 (H) 4.0 - 10.5 K/uL  ? RBC 3.74 (L) 4.22 - 5.81 MIL/uL  ? Hemoglobin 10.4 (L) 13.0 - 17.0 g/dL  ? HCT 34.5 (L) 39.0 - 52.0 %  ? MCV 92.2 80.0 - 100.0 fL  ? MCH 27.8 26.0 - 34.0 pg  ? MCHC 30.1 30.0 - 36.0 g/dL  ? RDW 16.5 (H) 11.5 - 15.5 %  ? Platelets 207 150 - 400 K/uL  ? nRBC 0.0 0.0 - 0.2 %  ? Neutrophils Relative % 83 %  ? Neutro Abs 12.0 (H) 1.7 - 7.7 K/uL  ? Lymphocytes Relative 9 %  ? Lymphs Abs 1.4 0.7 - 4.0 K/uL  ? Monocytes Relative 6 %  ? Monocytes Absolute 0.9 0.1 - 1.0 K/uL  ? Eosinophils Relative 1 %  ? Eosinophils Absolute 0.1 0.0 - 0.5 K/uL  ? Basophils Relative 0 %  ? Basophils Absolute 0.1 0.0 - 0.1 K/uL  ? Immature Granulocytes 1 %  ? Abs Immature Granulocytes 0.12 (H) 0.00 - 0.07 K/uL  ?  Comment: Performed at [facility], [place] [address], [place], [place] [address]  ?Protime-INR     Status: None  ? Collection Time: 05/19/21  4:45 PM  ?Result Value Ref Range  ? Prothrombin Time 13.2 11.4 - 15.2 seconds  ? INR 1.0 0.8 - 1.2  ?  Comment: (NOTE) ?INR goal varies based on device and disease  states. ?Performed at [facility], [place] [address] [place], [place] ?[address] ?  ?Type and screen [place]     Status: None  ? Collection Time: 05/19/21  4:45 PM  ?Result Value Ref Range  ? ABO/RH(D) O POS   ? Antibody Screen NEG   ? Sample Expiration    ?  05/22/2021,2359 ?Performed at [facility], [place] [address] [place], [place] [address] ?  ?Comprehensive metabolic panel     Status: Abnormal  ?  Collection Time: 05/20/21  4:28 AM  ?Result Value Ref Range  ? Sodium 136 135 - 145 mmol/L  ? Potassium 4.9 3.5 - 5.1 mmol/L  ? Chloride 104 98 - 111 mmol/L  ? CO2 25 22 - 32 mmol/L  ? Glucose, Bld 85 70 - 99 mg/dL  ?  Comment: Glucose reference range applies only to samples taken after fasting for at least 8 hours.  ? BUN 27 (H) 8 - 23 mg/dL  ? Creatinine, Ser 1.47 (H) 0.61 - 1.24 mg/dL  ? Calcium 8.7 (L) 8.9 - 10.3 mg/dL  ? Total Protein 6.7 6.5 - 8.1 g/dL  ? Albumin 3.4 (L) 3.5 - 5.0 g/dL  ? AST 32 15 - 41 U/L  ? ALT 19 0 - 44 U/L  ? Alkaline Phosphatase 110 38 - 126 U/L  ? Total Bilirubin 1.0 0.3 - 1.2 mg/dL  ? GFR, Estimated 49 (L) >60 mL/min  ?  Comment: (NOTE) ?Calculated using the CKD-EPI Creatinine Equation (2021) ?  ? Anion gap 7 5 - 15  ?  Comment: Performed at [facility], [place] [address] [place], [place] [address]  ?CBC     Status: Abnormal  ? Collection Time: 05/20/21  4:28 AM  ?Result Value Ref Range  ? WBC 12.2 (H) 4.0 - 10.5 K/uL  ? RBC 3.71 (L) 4.22 - 5.81 MIL/uL  ? Hemoglobin 10.2 (L) 13.0 - 17.0 g/dL  ? HCT 32.8 (L) 39.0 - 52.0 %  ? MCV 88.4 80.0 - 100.0 fL  ? MCH 27.5 26.0 - 34.0 pg  ? MCHC 31.1 30.0 - 36.0 g/dL  ? RDW 16.4 (H) 11.5 - 15.5 %  ? Platelets 227 150 - 400 K/uL  ? nRBC 0.0 0.0 - 0.2 %  ?  Comment: Performed at [facility], [place] [address] [place], [place] [address]  ?Magnesium     Status: None  ? Collection Time: 05/20/21  4:28 AM  ?Result Value Ref Range  ? Magnesium 2.2 1.7 - 2.4 mg/dL  ?  Comment: Performed at [facility],  [place] [address], [place],  [address]  ?Phosphorus     Status: None  ? Collection Time: 05/20/21  4:28 AM  ?Result Value Ref Range  ? Phosphorus 4.1 2.5 - 4.6 mg/dL  ?  Comment: Performed at [facility], [place] [address]

## 2021-05-20 NOTE — Progress Notes (Signed)
Pt is disoriented x 3. Pt unable to provide the neccessary information for the hospital admission. This Probation officer tried to contact pt's spouse Torrisi,[name] at [phone] [facility]), but she did not answer ?

## 2021-05-20 NOTE — ED Notes (Signed)
Pt has self removed two IV, pt remains very confused. RN attempted mitts for safety to prevent further pulling at cords and wires. RN obtained order for sitter. Pt agitated and confused, RN updated Dr Cruzita Lederer. ?

## 2021-05-20 NOTE — Anesthesia Preprocedure Evaluation (Addendum)
Anesthesia Evaluation  ?Patient identified by MRN, date of birth, ID band ?Patient confused ? ? ? ?Reviewed: ?Allergy & Precautions, NPO status , Patient's Chart, lab work & pertinent test results ? ?History of Anesthesia Complications ?Negative for: history of anesthetic complications ? ?Airway ?Mallampati: II ? ?TM Distance: >3 FB ?Neck ROM: Full ? ? ? Dental ? ?(+) Edentulous Upper, Edentulous Lower ?Patient alert/oriented x 1. No family at bedside. Unable to give dental advisory.:   ?Pulmonary ?pneumonia, COPD, former smoker,  ?S/p lobectomy ?  ?Pulmonary exam normal ? ? ? ? ? ? ? Cardiovascular ?hypertension, Pt. on medications and Pt. on home beta blockers ?+ Peripheral Vascular Disease  ?Normal cardiovascular exam ?Rhythm:Regular Rate:Normal ? ?S/P TCAR [name] 2023 ?  ?Neuro/Psych ?Anxiety Depression Carotid stenosis s/p TCAR ?  ? GI/Hepatic ?Neg liver ROS, PUD,   ?Endo/Other  ?negative endocrine ROS ? 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  ?Musculoskeletal ? ?(+) Arthritis ,  ? Abdominal ?  ?Peds ? Hematology ? ?(+) Blood dyscrasia, anemia , Plavix, last dose 4/12   ?Anesthesia Other Findings ? ? Reproductive/Obstetrics ? ?  ? ? ? ? ? ? ? ? ? ? ? ? ? ?  ?  ? ? ? ? ? ? ?Anesthesia Physical ?Anesthesia Plan ? ?ASA: 4 ? ?Anesthesia Plan: General  ? ?Post-op Pain Management: Ofirmev IV (intra-op)*  ? ?Induction: Intravenous ? ?PONV Risk Score and Plan: 2 and Ondansetron, Dexamethasone, Treatment may vary due to age or medical condition and Midazolam ? ?Airway Management Planned: Oral ETT ? ?Additional Equipment: Arterial line ? ?Intra-op Plan:  ? ?Post-operative Plan: Possible Post-op intubation/ventilation ? ?Informed Consent: I have reviewed the patients History and Physical, chart, labs and discussed the procedure including the risks, benefits and alternatives for the proposed anesthesia with the patient or authorized representative who has  indicated his/her understanding and acceptance.  ? ? ? ?Consent reviewed with POA ? ?Plan Discussed with:  ? ?Anesthesia Plan Comments: (Consent reviewed by phone with wife. Discussed with her that patient is high risk from a pulmonary standpoint and may require postop ventilation. He is not a candidate for spinal anesthesia due to Plavix. )  ? ? ? ? ? ?Anesthesia Quick Evaluation ? ?

## 2021-05-20 NOTE — H&P (View-Only) (Signed)
Reason for Consult:Right hip fx ?Referring Physician: Marzetta Board ?Time called: 1104 ?Time at bedside: 1224 ? ? ?William Kirk is an 81 y.o. male.  ?HPI: [place] fell backwards while going up some steps at home. He had immediate right hip pain and could not get up. He was brought to the ED where x-rays showed a right periprosthetic hip fx and orthopedic surgery was consulted. Attempts were made to have pt transferred to [facility] as his original surgery was done there and it was long enough ago that we didn't have access to records through care everywhere but was refused. He is confused currently and cannot contribute meaningfully to history. ? ?Past Medical History:  ?Diagnosis Date  ? Anxiety   ? Arthritis   ? "hands" (09/21/2016)  ? Bleeding stomach ulcer 2000s  ? COPD (chronic obstructive pulmonary disease) ([place])   ? Cough   ? Depression   ? Dyspnea   ? Hypertension   ? Pneumonia   ? "now and several times before this" (09/21/2016)  ? PVD (peripheral vascular disease) ([place]) 2014  ? prev PTCA on RLE  ? ? ?Past Surgical History:  ?Procedure Laterality Date  ? ABDOMINAL AORTOGRAM W/LOWER EXTREMITY Bilateral 03/05/2021  ? Procedure: ABDOMINAL AORTOGRAM W/LOWER EXTREMITY;  Surgeon: Cherre Robins, MD;  Location: [place] [facility];  Service: Cardiovascular;  Laterality: Bilateral;  ? IR ANGIOGRAM SELECTIVE EACH ADDITIONAL VESSEL  07/26/2018  ? IR ANGIOGRAM SELECTIVE EACH ADDITIONAL VESSEL  07/26/2018  ? IR ANGIOGRAM SELECTIVE EACH ADDITIONAL VESSEL  07/26/2018  ? IR EMBO ART  VEN HEMORR LYMPH EXTRAV  INC GUIDE ROADMAPPING  07/26/2018  ? IR GASTROSTOMY TUBE MOD SED  12/14/2018  ? IR US GUIDE VASC ACCESS RIGHT  07/26/2018  ? JOINT REPLACEMENT    ? KNEE ARTHROSCOPY Right X 1  ? PERIPHERAL VASCULAR BALLOON ANGIOPLASTY  03/05/2021  ? Procedure: PERIPHERAL VASCULAR BALLOON ANGIOPLASTY;  Surgeon: Cherre Robins, MD;  Location: [place] [facility];  Service: Cardiovascular;;  DCB Popliteal  ? PERIPHERAL VASCULAR INTERVENTION   03/05/2021  ? Procedure: PERIPHERAL VASCULAR INTERVENTION;  Surgeon: Cherre Robins, MD;  Location: [place] [facility];  Service: Cardiovascular;;  ? TOTAL HIP ARTHROPLASTY Right 2009  ? TRANSCAROTID ARTERY REVASCULARIZATION?  Right 02/24/2021  ? Procedure: RIGHT TRANSCAROTID ARTERY REVASCULARIZATION;  Surgeon: Cherre Robins, MD;  Location: [facility] OR;  Service: Vascular;  Laterality: Right;  ? ULTRASOUND GUIDANCE FOR VASCULAR ACCESS Left 02/24/2021  ? Procedure: ULTRASOUND GUIDANCE FOR VASCULAR ACCESS;  Surgeon: Cherre Robins, MD;  Location: [place];  Service: Vascular;  Laterality: Left;  ? VIDEO ASSISTED THORACOSCOPY (VATS)/ LOBECTOMY Right 10/18/2018  ? Procedure: VIDEO ASSISTED THORACOSCOPY (VATS)/ right lower LOBECTOMY;  Surgeon: Melrose Nakayama, MD;  Location: [place];  Service: Thoracic;  Laterality: Right;  ? VIDEO ASSISTED THORACOSCOPY (VATS)/DECORTICATION Right 11/09/2018  ? Procedure: REDO VIDEO ASSISTED THORACOSCOPY (VATS)/DECORTICATION/DRAIN EFFUSION;  Surgeon: Melrose Nakayama, MD;  Location: [place];  Service: Thoracic;  Laterality: Right;  ? VIDEO BRONCHOSCOPY N/A 07/27/2018  ? Procedure: VIDEO BRONCHOSCOPY WITHOUT FLUORO;  Surgeon: Collene Gobble, MD;  Location: [place];  Service: Cardiopulmonary;  Laterality: N/A;  ? VIDEO BRONCHOSCOPY N/A 11/09/2018  ? Procedure: VIDEO BRONCHOSCOPY;  Surgeon: Melrose Nakayama, MD;  Location: [place];  Service: Thoracic;  Laterality: N/A;  ? ? ?Family History  ?Problem Relation Age of Onset  ? Cancer Father 43  ?     Brain  ? Dementia Mother   ? ? ?Social  History:  reports that he quit smoking about 6 years ago. His smoking use included cigarettes. He has a 55.00 pack-year smoking history. He has never used smokeless tobacco. He reports that he does not drink alcohol and does not use drugs. ? ?Allergies:  ?Allergies  ?Allergen Reactions  ? Testosterone Rash  ? ? ?Medications: I have reviewed the patient's current medications. ? ?Results for orders placed or  performed during the hospital encounter of 05/19/21 (from the past 48 hour(s))  ?Basic metabolic panel     Status: Abnormal  ? Collection Time: 05/19/21  4:45 PM  ?Result Value Ref Range  ? Sodium 137 135 - 145 mmol/L  ? Potassium 4.6 3.5 - 5.1 mmol/L  ? Chloride 107 98 - 111 mmol/L  ? CO2 21 (L) 22 - 32 mmol/L  ? Glucose, Bld 89 70 - 99 mg/dL  ?  Comment: Glucose reference range applies only to samples taken after fasting for at least 8 hours.  ? BUN 25 (H) 8 - 23 mg/dL  ? Creatinine, Ser 1.40 (H) 0.61 - 1.24 mg/dL  ? Calcium 8.2 (L) 8.9 - 10.3 mg/dL  ? GFR, Estimated 51 (L) >60 mL/min  ?  Comment: (NOTE) ?Calculated using the CKD-EPI Creatinine Equation (2021) ?  ? Anion gap 9 5 - 15  ?  Comment: Performed at [facility], [place] [address], [place], [place] [address]  ?CBC with Differential     Status: Abnormal  ? Collection Time: 05/19/21  4:45 PM  ?Result Value Ref Range  ? WBC 14.6 (H) 4.0 - 10.5 K/uL  ? RBC 3.74 (L) 4.22 - 5.81 MIL/uL  ? Hemoglobin 10.4 (L) 13.0 - 17.0 g/dL  ? HCT 34.5 (L) 39.0 - 52.0 %  ? MCV 92.2 80.0 - 100.0 fL  ? MCH 27.8 26.0 - 34.0 pg  ? MCHC 30.1 30.0 - 36.0 g/dL  ? RDW 16.5 (H) 11.5 - 15.5 %  ? Platelets 207 150 - 400 K/uL  ? nRBC 0.0 0.0 - 0.2 %  ? Neutrophils Relative % 83 %  ? Neutro Abs 12.0 (H) 1.7 - 7.7 K/uL  ? Lymphocytes Relative 9 %  ? Lymphs Abs 1.4 0.7 - 4.0 K/uL  ? Monocytes Relative 6 %  ? Monocytes Absolute 0.9 0.1 - 1.0 K/uL  ? Eosinophils Relative 1 %  ? Eosinophils Absolute 0.1 0.0 - 0.5 K/uL  ? Basophils Relative 0 %  ? Basophils Absolute 0.1 0.0 - 0.1 K/uL  ? Immature Granulocytes 1 %  ? Abs Immature Granulocytes 0.12 (H) 0.00 - 0.07 K/uL  ?  Comment: Performed at [facility], [place] [address], [place], [place] [address]  ?Protime-INR     Status: None  ? Collection Time: 05/19/21  4:45 PM  ?Result Value Ref Range  ? Prothrombin Time 13.2 11.4 - 15.2 seconds  ? INR 1.0 0.8 - 1.2  ?  Comment: (NOTE) ?INR goal varies based on device and disease  states. ?Performed at [facility], [place] [address] [place], [place] ?[address] ?  ?Type and screen [place]     Status: None  ? Collection Time: 05/19/21  4:45 PM  ?Result Value Ref Range  ? ABO/RH(D) O POS   ? Antibody Screen NEG   ? Sample Expiration    ?  05/22/2021,2359 ?Performed at [facility], [place] [address], [place], [place] [address] ?  ?Comprehensive metabolic panel     Status: Abnormal  ?  Collection Time: 05/20/21  4:28 AM  ?Result Value Ref Range  ? Sodium 136 135 - 145 mmol/L  ? Potassium 4.9 3.5 - 5.1 mmol/L  ? Chloride 104 98 - 111 mmol/L  ? CO2 25 22 - 32 mmol/L  ? Glucose, Bld 85 70 - 99 mg/dL  ?  Comment: Glucose reference range applies only to samples taken after fasting for at least 8 hours.  ? BUN 27 (H) 8 - 23 mg/dL  ? Creatinine, Ser 1.47 (H) 0.61 - 1.24 mg/dL  ? Calcium 8.7 (L) 8.9 - 10.3 mg/dL  ? Total Protein 6.7 6.5 - 8.1 g/dL  ? Albumin 3.4 (L) 3.5 - 5.0 g/dL  ? AST 32 15 - 41 U/L  ? ALT 19 0 - 44 U/L  ? Alkaline Phosphatase 110 38 - 126 U/L  ? Total Bilirubin 1.0 0.3 - 1.2 mg/dL  ? GFR, Estimated 49 (L) >60 mL/min  ?  Comment: (NOTE) ?Calculated using the CKD-EPI Creatinine Equation (2021) ?  ? Anion gap 7 5 - 15  ?  Comment: Performed at [facility], [place] [address] [place], [place] [address]  ?CBC     Status: Abnormal  ? Collection Time: 05/20/21  4:28 AM  ?Result Value Ref Range  ? WBC 12.2 (H) 4.0 - 10.5 K/uL  ? RBC 3.71 (L) 4.22 - 5.81 MIL/uL  ? Hemoglobin 10.2 (L) 13.0 - 17.0 g/dL  ? HCT 32.8 (L) 39.0 - 52.0 %  ? MCV 88.4 80.0 - 100.0 fL  ? MCH 27.5 26.0 - 34.0 pg  ? MCHC 31.1 30.0 - 36.0 g/dL  ? RDW 16.4 (H) 11.5 - 15.5 %  ? Platelets 227 150 - 400 K/uL  ? nRBC 0.0 0.0 - 0.2 %  ?  Comment: Performed at [facility], [place] [address] [place], [place] [address]  ?Magnesium     Status: None  ? Collection Time: 05/20/21  4:28 AM  ?Result Value Ref Range  ? Magnesium 2.2 1.7 - 2.4 mg/dL  ?  Comment: Performed at [facility],  [place] [address] [place], [place] [address]  ?Phosphorus     Status: None  ? Collection Time: 05/20/21  4:28 AM  ?Result Value Ref Range  ? Phosphorus 4.1 2.5 - 4.6 mg/dL  ?  Comment: Performed at [facility], [place] [address]

## 2021-05-20 NOTE — ED Notes (Signed)
Tell patient his wife called ?

## 2021-05-20 NOTE — Progress Notes (Signed)
?PROGRESS NOTE ? ?William Kirk UEA:[id number] DOB: 1940/10/15 DOA: 05/19/2021 ?PCP: Floreen Comber, PA-C ? ? LOS: 1 day  ? ?Brief Narrative / Interim history: ?81 year old male with PVD, COPD, HLD, CKD 3A who comes into the hospital after falling.  This happened while going up the steps at home, fell backwards landed on his right hip.  No loss of consciousness, no chest pain, no shortness of breath.  He was complaining of right hip pain and difficulties walking.  He has a history of right hip surgery about 5 years ago at [facility].  Orthopedic surgery consulted, initially there were plans for patient to get transferred to [facility] but ended up being admitted here after EDP discussed with Ortho last night ? ?Subjective / 24h Interval events: ?Doing well this morning, no chest pain, no shortness of breath. ? ?Assesement and Plan: ?Principal Problem: ?  Right femoral fracture (HCC) ?Active Problems: ?  Depression ?  Chronic obstructive pulmonary disease (HCC) ?  Status post lobectomy of lung ?  Dehydration ?  Leukocytosis ?  Mixed hyperlipidemia ?  Chronic kidney disease, stage 3a (HCC) ? ? ?Assessment and Plan: ?Principal problem ?Right periprosthetic hip fracture-orthopedic surgery consult pending, appreciate input.  Continue pain control.  May need transfer to [facility] but Ortho will weigh in shortly ? ?Active problems ?Leukocytosis-reactive, improving ? ?Peripheral vascular disease-continue aspirin, statin.  He is on Plavix at home but hold given presumed upcoming surgery ? ?Hyperlipidemia-continue statin ? ?COPD, history of lung lobectomy-respiratory status stable.  He is on 3 L, tells me he does not use oxygen but used to in the past.  Suspect close to baseline. ? ?Chronic kidney disease stage IIIa-creatinine at baseline.  Continue to closely monitor ? ?Depression-continue Zoloft ? ? ?Scheduled Meds: ? aspirin EC  81 mg Oral Daily  ? atorvastatin  80 mg Oral QHS  ? enoxaparin  40 mg Subcutaneous  Daily  ? gabapentin  800 mg Oral BID  ? metoprolol tartrate  12.5 mg Oral Daily  ? mometasone-formoterol  2 puff Inhalation BID  ? sertraline  200 mg Per Tube Daily  ? Tiotropium Bromide Monohydrate  2 puff Inhalation q AM  ? traZODone  50 mg Oral QHS  ? ?Continuous Infusions: ? sodium chloride 75 mL/hr at 05/20/21 0327  ? ?PRN Meds:.acetaminophen **OR** acetaminophen, albuterol, morphine injection ? ?Diet Orders (From admission, onward)  ? ?  Start     Ordered  ? 05/20/21 1003  Diet regular Room service appropriate? Yes; Fluid consistency: Thin  Diet effective now       ?Question Answer Comment  ?Room service appropriate? Yes   ?Fluid consistency: Thin   ?  ? 05/20/21 1002  ? ?  ?  ? ?  ? ? ?DVT prophylaxis: enoxaparin (LOVENOX) injection 40 mg Start: 05/20/21 1015 ?SCDs Start: 05/20/21 0011 ? ? ?Lab Results  ?Component Value Date  ? PLT 227 05/20/2021  ? ? ?  Code Status: Full Code ? ?Family Communication: No family at bedside ? ?Status is: Inpatient ? ?Remains inpatient appropriate because: hip fracture ? ? ?Level of care: Med-Surg ? ?Consultants:  ?Orthopedic surgery  ? ?Procedures:  ?none ? ?Microbiology  ?none ? ?Antimicrobials: ?none  ? ? ?Objective: ?Vitals:  ? 05/20/21 0600 05/20/21 0859 05/20/21 0930 05/20/21 1000  ?BP: (!) 111/54 (!) 110/56 106/60 107/90  ?Pulse: 79 82 79 78  ?Resp: 19 20 16    ?Temp:  98 ?F (36.7 ?C)    ?TempSrc:  Oral    ?  SpO2: 97% 93% 98% 97%  ?Weight:      ?Height:      ? ?No intake or output data in the 24 hours ending 05/20/21 1036 ?Wt Readings from Last 3 Encounters:  ?05/19/21 73.6 kg  ?05/07/21 73.6 kg  ?03/30/21 75.8 kg  ? ? ?Examination: ? ?Constitutional: NAD ?Eyes: no scleral icterus ?ENMT: Mucous membranes are moist.  ?Neck: normal, supple ?Respiratory: CTA ?Cardiovascular: RRR ?Abdomen: non distended ?Musculoskeletal: no clubbing / cyanosis.  ?Skin: no rashes ? ?Data Reviewed: I have independently reviewed following labs and imaging studies  ? ?CBC ?Recent Labs  ?Lab  05/19/21 ?1645 05/20/21 ?0428  ?WBC 14.6* 12.2*  ?HGB 10.4* 10.2*  ?HCT 34.5* 32.8*  ?PLT 207 227  ?MCV 92.2 88.4  ?MCH 27.8 27.5  ?MCHC 30.1 31.1  ?RDW 16.5* 16.4*  ?LYMPHSABS 1.4  --   ?MONOABS 0.9  --   ?EOSABS 0.1  --   ?BASOSABS 0.1  --   ? ? ?Recent Labs  ?Lab 05/19/21 ?1645 05/20/21 ?0428  ?NA 137 136  ?K 4.6 4.9  ?CL 107 104  ?CO2 21* 25  ?GLUCOSE 89 85  ?BUN 25* 27*  ?CREATININE 1.40* 1.47*  ?CALCIUM 8.2* 8.7*  ?AST  --  32  ?ALT  --  19  ?ALKPHOS  --  110  ?BILITOT  --  1.0  ?ALBUMIN  --  3.4*  ?MG  --  2.2  ?INR 1.0  --   ? ? ?------------------------------------------------------------------------------------------------------------------ ?No results for input(s): CHOL, HDL, LDLCALC, TRIG, CHOLHDL, LDLDIRECT in the last 72 hours. ? ?No results found for: HGBA1C ?------------------------------------------------------------------------------------------------------------------ ?No results for input(s): TSH, T4TOTAL, T3FREE, THYROIDAB in the last 72 hours. ? ?Invalid input(s): FREET3 ? ?Cardiac Enzymes ?No results for input(s): CKMB, TROPONINI, MYOGLOBIN in the last 168 hours. ? ?Invalid input(s): CK ?------------------------------------------------------------------------------------------------------------------ ?   ?Component Value Date/Time  ? BNP 113.2 (H) 07/07/2018 1151  ? ? ?CBG: ?No results for input(s): GLUCAP in the last 168 hours. ? ?No results found for this or any previous visit (from the past 240 hour(s)).  ? ?Radiology Studies: ?CT HIP RIGHT WO CONTRAST ? ?Result Date: 05/20/2021 ?CLINICAL DATA:  82 year old male status post fall with periprosthetic right femur fracture. EXAM: CT OF THE RIGHT HIP WITHOUT CONTRAST TECHNIQUE: Multidetector CT imaging of the right hip was performed according to the standard protocol. Multiplanar CT image reconstructions were also generated. RADIATION DOSE REDUCTION: This exam was performed according to the departmental dose-optimization program which includes  automated exposure control, adjustment of the mA and/or kV according to patient size and/or use of iterative reconstruction technique. COMPARISON:  Right femur series today, hip series yesterday. FINDINGS: Osteopenia.  Visible right hemipelvis intact. Bipolar hip arthroplasty. Acetabular component appears satisfactory. Femoral head component aligned. Beginning just caudal to the greater trochanter there is a spiral periprosthetic fracture of the femur which tracks longitudinally a distance of about 16 cm. See series 4, images 49 through 125 and coronal series 10 images 23 through 40. Minimal displacement, slight varus angulation as seen radiographically. Possible mild associated deep intramuscular hematoma. Negative visible noncontrast pelvis aside from diverticulosis and calcified atherosclerosis. Severe right femoral artery calcified atherosclerosis. IMPRESSION: 1. Periprosthetic spiral fracture of the proximal right femur extends a distance of 16 cm from just caudal to the greater trochanter through the proximal shaft. Mild varus angulation as seen radiographically but minimal to mild displacement overall. 2. Underlying osteopenia. No hip arthroplasty dislocation. No other acute fracture identified. 3. Calcified atherosclerosis with severe right  femoral artery involvement. Electronically Signed   By: Odessa Fleming M.D.   On: 05/20/2021 07:37  ? ?DG Hip Unilat With Pelvis 2-3 Views Right ? ?Result Date: 05/19/2021 ?CLINICAL DATA:  Fall, pain EXAM: DG HIP (WITH OR WITHOUT PELVIS) 2-3V RIGHT COMPARISON:  None. FINDINGS: Status post right hip arthroplasty. There is a periprosthetic fracture about the inferolateral aspect of the femoral component with approximately 1 cortex width displacement. There is no appreciable fracture about the acetabular component. Ilioischial and iliopubic lines are maintained. Sacroiliac joint and pubic symphysis are intact. No appreciable fracture of the left hip. Degenerate disc disease of the  lower lumbar spine. Vascular calcifications. IMPRESSION: Periprosthetic right femoral fracture about the inferolateral aspect of the femoral component with approximately one cortex width displacement. Electroni

## 2021-05-21 ENCOUNTER — Encounter (HOSPITAL_COMMUNITY): Payer: Self-pay | Admitting: Internal Medicine

## 2021-05-21 ENCOUNTER — Encounter (HOSPITAL_COMMUNITY)
Admission: EM | Disposition: A | Discharge status: Skilled Nursing Facility | Payer: Self-pay | Source: Home / Self Care | Attending: Internal Medicine

## 2021-05-21 ENCOUNTER — Inpatient Hospital Stay (HOSPITAL_COMMUNITY): Payer: No Typology Code available for payment source

## 2021-05-21 ENCOUNTER — Other Ambulatory Visit: Payer: Self-pay

## 2021-05-21 ENCOUNTER — Inpatient Hospital Stay (HOSPITAL_COMMUNITY): Payer: No Typology Code available for payment source | Admitting: Anesthesiology

## 2021-05-21 DIAGNOSIS — T84030A Mechanical loosening of internal right hip prosthetic joint, initial encounter: Secondary | ICD-10-CM

## 2021-05-21 DIAGNOSIS — J9602 Acute respiratory failure with hypercapnia: Secondary | ICD-10-CM

## 2021-05-21 DIAGNOSIS — N1831 Chronic kidney disease, stage 3a: Secondary | ICD-10-CM | POA: Diagnosis not present

## 2021-05-21 DIAGNOSIS — J9622 Acute and chronic respiratory failure with hypercapnia: Secondary | ICD-10-CM

## 2021-05-21 DIAGNOSIS — J9621 Acute and chronic respiratory failure with hypoxia: Secondary | ICD-10-CM | POA: Diagnosis not present

## 2021-05-21 DIAGNOSIS — S7291XD Unspecified fracture of right femur, subsequent encounter for closed fracture with routine healing: Secondary | ICD-10-CM | POA: Diagnosis not present

## 2021-05-21 DIAGNOSIS — J9601 Acute respiratory failure with hypoxia: Secondary | ICD-10-CM | POA: Diagnosis not present

## 2021-05-21 DIAGNOSIS — G9340 Encephalopathy, unspecified: Secondary | ICD-10-CM

## 2021-05-21 DIAGNOSIS — N179 Acute kidney failure, unspecified: Secondary | ICD-10-CM | POA: Diagnosis not present

## 2021-05-21 DIAGNOSIS — M9701XA Periprosthetic fracture around internal prosthetic right hip joint, initial encounter: Secondary | ICD-10-CM

## 2021-05-21 DIAGNOSIS — I10 Essential (primary) hypertension: Secondary | ICD-10-CM

## 2021-05-21 DIAGNOSIS — E8729 Other acidosis: Secondary | ICD-10-CM

## 2021-05-21 DIAGNOSIS — J449 Chronic obstructive pulmonary disease, unspecified: Secondary | ICD-10-CM | POA: Diagnosis not present

## 2021-05-21 HISTORY — PX: HIP ARTHROPLASTY: SHX981

## 2021-05-21 LAB — CBC
HCT: 34.7 % — ABNORMAL LOW (ref 39.0–52.0)
Hemoglobin: 10.7 g/dL — ABNORMAL LOW (ref 13.0–17.0)
MCH: 27.3 pg (ref 26.0–34.0)
MCHC: 30.8 g/dL (ref 30.0–36.0)
MCV: 88.5 fL (ref 80.0–100.0)
Platelets: 236 10*3/uL (ref 150–400)
RBC: 3.92 MIL/uL — ABNORMAL LOW (ref 4.22–5.81)
RDW: 16.5 % — ABNORMAL HIGH (ref 11.5–15.5)
WBC: 22.7 10*3/uL — ABNORMAL HIGH (ref 4.0–10.5)
nRBC: 0 % (ref 0.0–0.2)

## 2021-05-21 LAB — POCT I-STAT 7, (LYTES, BLD GAS, ICA,H+H)
Acid-base deficit: 4 mmol/L — ABNORMAL HIGH (ref 0.0–2.0)
Acid-base deficit: 3 mmol/L — ABNORMAL HIGH (ref 0.0–2.0)
Bicarbonate: 23.4 mmol/L (ref 20.0–28.0)
Bicarbonate: 24.6 mmol/L (ref 20.0–28.0)
Calcium, Ion: 1.21 mmol/L (ref 1.15–1.40)
Calcium, Ion: 1.21 mmol/L (ref 1.15–1.40)
HCT: 30 % — ABNORMAL LOW (ref 39.0–52.0)
HCT: 26 % — ABNORMAL LOW (ref 39.0–52.0)
Hemoglobin: 10.2 g/dL — ABNORMAL LOW (ref 13.0–17.0)
Hemoglobin: 8.8 g/dL — ABNORMAL LOW (ref 13.0–17.0)
O2 Saturation: 97 %
O2 Saturation: 92 %
Patient temperature: 37.3
Patient temperature: 97.7
Potassium: 4.6 mmol/L (ref 3.5–5.1)
Potassium: 4.7 mmol/L (ref 3.5–5.1)
Sodium: 138 mmol/L (ref 135–145)
Sodium: 139 mmol/L (ref 135–145)
TCO2: 25 mmol/L (ref 22–32)
TCO2: 26 mmol/L (ref 22–32)
pCO2 arterial: 51.6 mmHg — ABNORMAL HIGH (ref 32–48)
pCO2 arterial: 54.4 mmHg — ABNORMAL HIGH (ref 32–48)
pH, Arterial: 7.266 — ABNORMAL LOW (ref 7.35–7.45)
pH, Arterial: 7.26 — ABNORMAL LOW (ref 7.35–7.45)
pO2, Arterial: 102 mmHg (ref 83–108)
pO2, Arterial: 72 mmHg — ABNORMAL LOW (ref 83–108)

## 2021-05-21 LAB — COMPREHENSIVE METABOLIC PANEL
ALT: 22 U/L (ref 0–44)
AST: 45 U/L — ABNORMAL HIGH (ref 15–41)
Albumin: 3 g/dL — ABNORMAL LOW (ref 3.5–5.0)
Alkaline Phosphatase: 106 U/L (ref 38–126)
Anion gap: 13 (ref 5–15)
BUN: 36 mg/dL — ABNORMAL HIGH (ref 8–23)
CO2: 19 mmol/L — ABNORMAL LOW (ref 22–32)
Calcium: 8.6 mg/dL — ABNORMAL LOW (ref 8.9–10.3)
Chloride: 104 mmol/L (ref 98–111)
Creatinine, Ser: 1.95 mg/dL — ABNORMAL HIGH (ref 0.61–1.24)
GFR, Estimated: 35 mL/min — ABNORMAL LOW (ref >60)
Glucose, Bld: 78 mg/dL (ref 70–99)
Potassium: 4.2 mmol/L (ref 3.5–5.1)
Sodium: 136 mmol/L (ref 135–145)
Total Bilirubin: 1.5 mg/dL — ABNORMAL HIGH (ref 0.3–1.2)
Total Protein: 6.6 g/dL (ref 6.5–8.1)

## 2021-05-21 LAB — PREPARE RBC (CROSSMATCH)

## 2021-05-21 LAB — SURGICAL PCR SCREEN
MRSA, PCR: NEGATIVE
Staphylococcus aureus: NEGATIVE

## 2021-05-21 LAB — GLUCOSE, CAPILLARY
Glucose-Capillary: 114 mg/dL — ABNORMAL HIGH (ref 70–99)
Glucose-Capillary: 133 mg/dL — ABNORMAL HIGH (ref 70–99)

## 2021-05-21 SURGERY — HEMIARTHROPLASTY, HIP, DIRECT ANTERIOR APPROACH, FOR FRACTURE
Anesthesia: General | Site: Hip | Laterality: Right

## 2021-05-21 MED ORDER — POVIDONE-IODINE 10 % EX SWAB: 2.0000 "application " | Freq: Once | CUTANEOUS | Status: DC

## 2021-05-21 MED ORDER — 0.9 % SODIUM CHLORIDE (POUR BTL) OPTIME
TOPICAL | Status: DC | PRN
  Administered 2021-05-21: 1000 mL

## 2021-05-21 MED ORDER — OXYCODONE HCL 5 MG PO TABS: 5.0000 mg | ORAL_TABLET | Freq: Once | ORAL | Status: DC | PRN

## 2021-05-21 MED ORDER — AMISULPRIDE (ANTIEMETIC) 5 MG/2ML IV SOLN: 10.0000 mg | Freq: Once | INTRAVENOUS | Status: DC | PRN

## 2021-05-21 MED ORDER — ACETAMINOPHEN 650 MG RE SUPP
650.0000 mg | Freq: Three times a day (TID) | RECTAL | Status: DC
  Administered 2021-05-21 – 2021-05-22 (×2): 650 mg via RECTAL
  Filled 2021-05-21 (×4): qty 1

## 2021-05-21 MED ORDER — PHENYLEPHRINE HCL-NACL 20-0.9 MG/250ML-% IV SOLN
INTRAVENOUS | Status: DC | PRN
  Administered 2021-05-21: 40 ug/min via INTRAVENOUS

## 2021-05-21 MED ORDER — ACETAMINOPHEN 10 MG/ML IV SOLN
INTRAVENOUS | Status: AC
  Filled 2021-05-21: qty 100

## 2021-05-21 MED ORDER — HYDRALAZINE HCL 20 MG/ML IJ SOLN
10.0000 mg | Freq: Four times a day (QID) | INTRAMUSCULAR | Status: DC | PRN
  Administered 2021-05-21: 10 mg via INTRAVENOUS
  Filled 2021-05-21: qty 1

## 2021-05-21 MED ORDER — ONDANSETRON HCL 4 MG/2ML IJ SOLN
INTRAMUSCULAR | Status: AC
  Filled 2021-05-21: qty 2

## 2021-05-21 MED ORDER — ONDANSETRON HCL 4 MG/2ML IJ SOLN
INTRAMUSCULAR | Status: DC | PRN
  Administered 2021-05-21: 4 mg via INTRAVENOUS

## 2021-05-21 MED ORDER — FENTANYL CITRATE (PF) 250 MCG/5ML IJ SOLN
INTRAMUSCULAR | Status: DC | PRN
  Administered 2021-05-21 (×2): 50 ug via INTRAVENOUS

## 2021-05-21 MED ORDER — ENOXAPARIN SODIUM 40 MG/0.4ML IJ SOSY
40.0000 mg | PREFILLED_SYRINGE | INTRAMUSCULAR | Status: DC
Start: 2021-05-22 — End: 2021-05-31
  Administered 2021-05-22 – 2021-05-31 (×8): 40 mg via SUBCUTANEOUS
  Filled 2021-05-21 (×10): qty 0.4

## 2021-05-21 MED ORDER — PIPERACILLIN-TAZOBACTAM 3.375 G IVPB
3.3750 g | Freq: Three times a day (TID) | INTRAVENOUS | Status: DC
  Administered 2021-05-21 – 2021-05-24 (×8): 3.375 g via INTRAVENOUS
  Filled 2021-05-21 (×9): qty 50

## 2021-05-21 MED ORDER — LACTATED RINGERS IV SOLN: INTRAVENOUS | Status: DC | PRN

## 2021-05-21 MED ORDER — ORAL CARE MOUTH RINSE
15.0000 mL | Freq: Two times a day (BID) | OROMUCOSAL | Status: DC
  Administered 2021-05-22 – 2021-06-02 (×23): 15 mL via OROMUCOSAL

## 2021-05-21 MED ORDER — LACTATED RINGERS IV BOLUS
250.0000 mL | Freq: Once | INTRAVENOUS | Status: AC
  Administered 2021-05-21: 250 mL via INTRAVENOUS

## 2021-05-21 MED ORDER — CEFAZOLIN SODIUM-DEXTROSE 2-4 GM/100ML-% IV SOLN
2.0000 g | Freq: Three times a day (TID) | INTRAVENOUS | Status: DC
  Filled 2021-05-21: qty 100

## 2021-05-21 MED ORDER — BUPIVACAINE HCL (PF) 0.25 % IJ SOLN
INTRAMUSCULAR | Status: AC
  Filled 2021-05-21: qty 30

## 2021-05-21 MED ORDER — CHLORHEXIDINE GLUCONATE CLOTH 2 % EX PADS
6.0000 | MEDICATED_PAD | Freq: Every day | CUTANEOUS | Status: DC
  Administered 2021-05-21 – 2021-06-02 (×11): 6 via TOPICAL

## 2021-05-21 MED ORDER — SODIUM CHLORIDE 0.9 % IR SOLN
Status: DC | PRN
  Administered 2021-05-21: 1000 mL

## 2021-05-21 MED ORDER — PIPERACILLIN-TAZOBACTAM 3.375 G IVPB 30 MIN
3.3750 g | INTRAVENOUS | Status: AC
  Administered 2021-05-21: 3.375 g via INTRAVENOUS
  Filled 2021-05-21 (×2): qty 50

## 2021-05-21 MED ORDER — CEFAZOLIN SODIUM-DEXTROSE 2-3 GM-%(50ML) IV SOLR
INTRAVENOUS | Status: DC | PRN
  Administered 2021-05-21: 2 g via INTRAVENOUS

## 2021-05-21 MED ORDER — ACETAMINOPHEN 500 MG PO TABS
1000.0000 mg | ORAL_TABLET | Freq: Once | ORAL | Status: DC
  Filled 2021-05-21 (×2): qty 2

## 2021-05-21 MED ORDER — CHLORHEXIDINE GLUCONATE 0.12 % MT SOLN
15.0000 mL | Freq: Two times a day (BID) | OROMUCOSAL | Status: DC
  Administered 2021-05-21 – 2021-06-02 (×22): 15 mL via OROMUCOSAL
  Filled 2021-05-21 (×16): qty 15

## 2021-05-21 MED ORDER — ONDANSETRON HCL 4 MG/2ML IJ SOLN: 4.0000 mg | Freq: Once | INTRAMUSCULAR | Status: DC | PRN

## 2021-05-21 MED ORDER — VANCOMYCIN HCL 1000 MG IV SOLR
INTRAVENOUS | Status: DC | PRN
  Administered 2021-05-21: 1000 mg via TOPICAL

## 2021-05-21 MED ORDER — PHENYLEPHRINE 40 MCG/ML (10ML) SYRINGE FOR IV PUSH (FOR BLOOD PRESSURE SUPPORT)
PREFILLED_SYRINGE | INTRAVENOUS | Status: AC
  Filled 2021-05-21: qty 10

## 2021-05-21 MED ORDER — ALBUMIN HUMAN 5 % IV SOLN: INTRAVENOUS | Status: DC | PRN

## 2021-05-21 MED ORDER — NOREPINEPHRINE 4 MG/250ML-% IV SOLN
2.0000 ug/min | INTRAVENOUS | Status: DC
  Administered 2021-05-21: 2 ug/min via INTRAVENOUS
  Administered 2021-05-22: 4 ug/min via INTRAVENOUS
  Filled 2021-05-21 (×2): qty 250

## 2021-05-21 MED ORDER — IPRATROPIUM-ALBUTEROL 0.5-2.5 (3) MG/3ML IN SOLN
3.0000 mL | RESPIRATORY_TRACT | Status: DC
  Administered 2021-05-21 (×2): 3 mL via RESPIRATORY_TRACT
  Filled 2021-05-21 (×3): qty 3

## 2021-05-21 MED ORDER — DEXAMETHASONE SODIUM PHOSPHATE 10 MG/ML IJ SOLN
INTRAMUSCULAR | Status: DC | PRN
  Administered 2021-05-21: 5 mg via INTRAVENOUS

## 2021-05-21 MED ORDER — CHLORHEXIDINE GLUCONATE 4 % EX LIQD: 60.0000 mL | Freq: Once | CUTANEOUS | Status: DC

## 2021-05-21 MED ORDER — LIDOCAINE 2% (20 MG/ML) 5 ML SYRINGE
INTRAMUSCULAR | Status: DC | PRN
  Administered 2021-05-21: 100 mg via INTRAVENOUS

## 2021-05-21 MED ORDER — FENTANYL CITRATE (PF) 100 MCG/2ML IJ SOLN: 25.0000 ug | INTRAMUSCULAR | Status: DC | PRN

## 2021-05-21 MED ORDER — PHENYLEPHRINE 40 MCG/ML (10ML) SYRINGE FOR IV PUSH (FOR BLOOD PRESSURE SUPPORT)
PREFILLED_SYRINGE | INTRAVENOUS | Status: DC | PRN
Start: 2021-05-21 — End: 2021-05-21
  Administered 2021-05-21 (×2): 120 ug via INTRAVENOUS

## 2021-05-21 MED ORDER — SUGAMMADEX SODIUM 200 MG/2ML IV SOLN
INTRAVENOUS | Status: DC | PRN
  Administered 2021-05-21: 200 mg via INTRAVENOUS

## 2021-05-21 MED ORDER — TRANEXAMIC ACID-NACL 1000-0.7 MG/100ML-% IV SOLN
1000.0000 mg | INTRAVENOUS | Status: DC
  Filled 2021-05-21: qty 100

## 2021-05-21 MED ORDER — ROCURONIUM BROMIDE 10 MG/ML (PF) SYRINGE
PREFILLED_SYRINGE | INTRAVENOUS | Status: AC
  Filled 2021-05-21: qty 10

## 2021-05-21 MED ORDER — BUPIVACAINE HCL 0.25 % IJ SOLN
INTRAMUSCULAR | Status: DC | PRN
  Administered 2021-05-21: 60 mL

## 2021-05-21 MED ORDER — PROPOFOL 10 MG/ML IV BOLUS
INTRAVENOUS | Status: DC | PRN
  Administered 2021-05-21: 50 mg via INTRAVENOUS

## 2021-05-21 MED ORDER — OXYCODONE HCL 5 MG/5ML PO SOLN: 5.0000 mg | Freq: Once | ORAL | Status: DC | PRN

## 2021-05-21 MED ORDER — ESMOLOL HCL 100 MG/10ML IV SOLN
INTRAVENOUS | Status: DC | PRN
  Administered 2021-05-21: 30 mg via INTRAVENOUS

## 2021-05-21 MED ORDER — ROCURONIUM BROMIDE 10 MG/ML (PF) SYRINGE
PREFILLED_SYRINGE | INTRAVENOUS | Status: DC | PRN
  Administered 2021-05-21: 20 mg via INTRAVENOUS
  Administered 2021-05-21: 30 mg via INTRAVENOUS
  Administered 2021-05-21: 80 mg via INTRAVENOUS

## 2021-05-21 MED ORDER — VANCOMYCIN HCL 1000 MG IV SOLR
INTRAVENOUS | Status: AC
  Filled 2021-05-21: qty 20

## 2021-05-21 MED ORDER — DEXAMETHASONE SODIUM PHOSPHATE 10 MG/ML IJ SOLN
INTRAMUSCULAR | Status: AC
  Filled 2021-05-21: qty 1

## 2021-05-21 MED ORDER — LACTATED RINGERS IV SOLN: INTRAVENOUS | Status: AC

## 2021-05-21 MED ORDER — LIDOCAINE 2% (20 MG/ML) 5 ML SYRINGE
INTRAMUSCULAR | Status: AC
  Filled 2021-05-21: qty 5

## 2021-05-21 MED ORDER — SODIUM CHLORIDE 0.9 % IV SOLN: INTRAVENOUS | Status: DC

## 2021-05-21 MED ORDER — PROPOFOL 10 MG/ML IV BOLUS
INTRAVENOUS | Status: AC
  Filled 2021-05-21: qty 20

## 2021-05-21 MED ORDER — SODIUM CHLORIDE 0.9 % IV SOLN: 250.0000 mL | INTRAVENOUS | Status: DC

## 2021-05-21 MED ORDER — ACETAMINOPHEN 10 MG/ML IV SOLN
INTRAVENOUS | Status: DC | PRN
  Administered 2021-05-21: 1000 mg via INTRAVENOUS

## 2021-05-21 MED ORDER — TRANEXAMIC ACID-NACL 1000-0.7 MG/100ML-% IV SOLN
INTRAVENOUS | Status: AC
  Filled 2021-05-21: qty 100

## 2021-05-21 MED ORDER — ALBUTEROL SULFATE HFA 108 (90 BASE) MCG/ACT IN AERS
INHALATION_SPRAY | RESPIRATORY_TRACT | Status: DC | PRN
Start: 2021-05-21 — End: 2021-05-21
  Administered 2021-05-21: 2 via RESPIRATORY_TRACT

## 2021-05-21 MED ORDER — ESMOLOL HCL 100 MG/10ML IV SOLN
INTRAVENOUS | Status: AC
  Filled 2021-05-21: qty 10

## 2021-05-21 MED ORDER — FENTANYL CITRATE (PF) 250 MCG/5ML IJ SOLN
INTRAMUSCULAR | Status: AC
  Filled 2021-05-21: qty 5

## 2021-05-21 MED ORDER — CEFAZOLIN SODIUM-DEXTROSE 2-4 GM/100ML-% IV SOLN
2.0000 g | INTRAVENOUS | Status: DC
  Filled 2021-05-21: qty 100

## 2021-05-21 MED ORDER — TRANEXAMIC ACID-NACL 1000-0.7 MG/100ML-% IV SOLN
INTRAVENOUS | Status: DC | PRN
  Administered 2021-05-21: 1000 mg via INTRAVENOUS

## 2021-05-21 SURGICAL SUPPLY — 71 items
ADH SKN CLS APL DERMABOND .7 (GAUZE/BANDAGES/DRESSINGS)
APL PRP STRL LF DISP 70% ISPRP (MISCELLANEOUS) ×2
BLADE SAGITTAL 25.0X1.27X90 (BLADE) ×3 IMPLANT
BRUSH FEMORAL CANAL (MISCELLANEOUS) IMPLANT
BUR 7 SOFT (BURR) ×1 IMPLANT
BUR EGG ELITE 4.0 (BURR) ×1 IMPLANT
BUR NEURO DRILL SOFT 3.0X3.8M (BURR) ×1 IMPLANT
CHLORAPREP W/TINT 26 (MISCELLANEOUS) ×6 IMPLANT
COVER SURGICAL LIGHT HANDLE (MISCELLANEOUS) ×3 IMPLANT
DERMABOND ADVANCED (GAUZE/BANDAGES/DRESSINGS)
DERMABOND ADVANCED .7 DNX12 (GAUZE/BANDAGES/DRESSINGS) ×2 IMPLANT
DRAPE HALF SHEET 40X57 (DRAPES) ×3 IMPLANT
DRAPE HIP W/POCKET STRL (MISCELLANEOUS) ×3 IMPLANT
DRAPE IMP U-DRAPE 54X76 (DRAPES) ×3 IMPLANT
DRAPE INCISE IOBAN 66X45 STRL (DRAPES) ×4 IMPLANT
DRAPE INCISE IOBAN 85X60 (DRAPES) ×3 IMPLANT
DRAPE POUCH INSTRU U-SHP 10X18 (DRAPES) ×1 IMPLANT
DRAPE U-SHAPE 47X51 STRL (DRAPES) ×4 IMPLANT
DRESSING AQUACEL AG SP 3.5X10 (GAUZE/BANDAGES/DRESSINGS) IMPLANT
DRESSING PREVENA PLUS CUSTOM (GAUZE/BANDAGES/DRESSINGS) IMPLANT
DRSG AQUACEL AG ADV 3.5X 6 (GAUZE/BANDAGES/DRESSINGS) IMPLANT
DRSG AQUACEL AG SP 3.5X10 (GAUZE/BANDAGES/DRESSINGS)
DRSG PREVENA PLUS CUSTOM (GAUZE/BANDAGES/DRESSINGS) ×2
ELECT BLADE 4.0 EZ CLEAN MEGAD (MISCELLANEOUS) ×2
ELECT REM PT RETURN 15FT ADLT (MISCELLANEOUS) ×3 IMPLANT
ELECTRODE BLDE 4.0 EZ CLN MEGD (MISCELLANEOUS) ×2 IMPLANT
FEMORAL HEAD V40 CERAMIC 28-4 (Head) ×1 IMPLANT
GLOVE SRG 8 PF TXTR STRL LF DI (GLOVE) ×2 IMPLANT
GLOVE SURG ORTHO LTX SZ8 (GLOVE) ×6 IMPLANT
GLOVE SURG UNDER POLY LF SZ8 (GLOVE) ×2
GOWN STRL REUS W/ TWL XL LVL3 (GOWN DISPOSABLE) ×2 IMPLANT
GOWN STRL REUS W/TWL XL LVL3 (GOWN DISPOSABLE) ×2
HANDPIECE INTERPULSE COAX TIP (DISPOSABLE)
HEAD FEM CER V40 28 -4 (Head) IMPLANT
HOOD PEEL AWAY FLYTE STAYCOOL (MISCELLANEOUS) ×9 IMPLANT
IRRIGATION SURGIPHOR STRL (IV SOLUTION) IMPLANT
KIT BASIN OR (CUSTOM PROCEDURE TRAY) ×3 IMPLANT
KIT DRSG PREVENA PLUS 7DAY 125 (MISCELLANEOUS) ×1 IMPLANT
KIT TURNOVER KIT A (KITS) ×3 IMPLANT
LINER ACETAB CER DM 56 (Liner) ×1 IMPLANT
MANIFOLD NEPTUNE II (INSTRUMENTS) ×3 IMPLANT
MARKER SKIN DUAL TIP RULER LAB (MISCELLANEOUS) ×4 IMPLANT
NDL 18GX1X1/2 (RX/OR ONLY) (NEEDLE) IMPLANT
NEEDLE 18GX1X1/2 (RX/OR ONLY) (NEEDLE) IMPLANT
NS IRRIG 1000ML POUR BTL (IV SOLUTION) ×3 IMPLANT
PACK TOTAL JOINT (CUSTOM PROCEDURE TRAY) ×3 IMPLANT
REST MOD HIP SYS SZ 55MMP0 V40 (Hips) ×2 IMPLANT
RETRIEVER SUT HEWSON (MISCELLANEOUS) ×2 IMPLANT
SEALER BIPOLAR AQUA 6.0 (INSTRUMENTS) ×3 IMPLANT
SET HNDPC FAN SPRY TIP SCT (DISPOSABLE) IMPLANT
SET INTERPULSE LAVAGE W/TIP (ORTHOPEDIC DISPOSABLE SUPPLIES) ×3 IMPLANT
SLEEVE CABLE 2MM VT (Orthopedic Implant) ×7 IMPLANT
SPONGE T-LAP 18X18 ~~LOC~~+RFID (SPONGE) ×8 IMPLANT
STAPLER VISISTAT 35W (STAPLE) ×1 IMPLANT
STEM FEM DIST STRAIGHT 20X195 (Stem) ×1 IMPLANT
STOCKINETTE IMPERVIOUS LG (DRAPES) ×2 IMPLANT
SUCTION FRAZIER HANDLE 10FR (MISCELLANEOUS)
SUCTION TUBE FRAZIER 10FR DISP (MISCELLANEOUS) IMPLANT
SUT ETHIBOND 2 V 37 (SUTURE) ×4 IMPLANT
SUT MNCRL AB 3-0 PS2 18 (SUTURE) ×2 IMPLANT
SUT STRATAFIX 1PDS 45CM VIOLET (SUTURE) ×6 IMPLANT
SUT VIC AB 0 CT1 27 (SUTURE) ×8
SUT VIC AB 0 CT1 27XBRD ANBCTR (SUTURE) ×2 IMPLANT
SUT VIC AB 2-0 CT2 27 (SUTURE) ×6 IMPLANT
SYR 20ML LL LF (SYRINGE) IMPLANT
SYSTEM RST MD HIP SZ55MMP0 V40 (Hips) IMPLANT
TOWEL GREEN STERILE (TOWEL DISPOSABLE) ×3 IMPLANT
TOWER CARTRIDGE SMART MIX (DISPOSABLE) IMPLANT
TRAY FOLEY MTR SLVR 16FR STAT (SET/KITS/TRAYS/PACK) IMPLANT
TUBE SUCT ARGYLE STRL (TUBING) ×3 IMPLANT
WATER STERILE IRR 1000ML POUR (IV SOLUTION) ×3 IMPLANT

## 2021-05-21 NOTE — Progress Notes (Signed)
eLink Physician-Brief Progress Note ?Patient Name: William Kirk ?DOB: 03-Feb-1941 ?MRN: [id number] ? ? ?Date of Service ? 05/21/2021  ?HPI/Events of Note ? Pt's MAP 50's due to low diastolic.  Current BP 134/33 (58) ? ?Mental status has improved  ? ?Discussed with RN. In ICU for type 2 failure.  ?Following commands ? ?Asp PNA ?COPD. S/p extubated , post Rt fem # ? ?EF 55%, from 2018. ? ?CxR: ?Reviewed and compared to 2021, shows right side pneumonia.  ?S/p lobectomy.  ? ? ?Camera: ?On o2 via venti mask. Diastolic is lower < 50. SBP < 100. ?No fever. ?Protecting airways. ?  ?eICU Interventions ? - start levophed gtt via 18 G, if requiring over 8 mcg/min to call back for  a central line from ground team  ?- bolus LR.  ?  ? ? ? ?Intervention Category ?Intermediate Interventions: Hypotension - evaluation and management ? ?Elmer Sow ?05/21/2021, 8:35 PM ?

## 2021-05-21 NOTE — Anesthesia Procedure Notes (Signed)
Arterial Line Insertion ?Performed by: [name], MD, anesthesiologist ? Preanesthetic checklist: patient identified, risks and benefits discussed and surgical consent ?Left, radial was placed ?Catheter size: 20 G ?Hand hygiene performed  and Seldinger technique used ? ?Attempts: 1 ?Procedure performed without using ultrasound guided technique. ?Following insertion, dressing applied. ?Post procedure assessment: normal ? ?Patient tolerated the procedure well with no immediate complications. ? ? ?

## 2021-05-21 NOTE — Consult Note (Signed)
? ?NAME:  William Kirk, MRN:  [id number], DOB:  1940/05/16, LOS: 2 ?ADMISSION DATE:  05/19/2021, CONSULTATION DATE:  4/14 ?REFERRING MD:  Dr. Elvera Lennox, CHIEF COMPLAINT:  Mechanical Fall, Altered Mental Status   ? ?History of Present Illness:  ?81 y/o M who presented to [facility] on 4/12 after suffering a mechanical fall at home.  ? ?The patient lives at home with his wife.  At baseline he is normally able to walk around the yard.  Does not use assistive devices. He was reportedly walking up steps to go into the home & was very short of breath and fell backward onto his right hip.  There was no reported LOC.  He complained of right hip pain and difficulty walking. He had prior right hip surgery at [facility]. Initial imaging showed an oblique fracture along the femoral stem component of the bipolar right hip arthroplasty with slightly increased varus angulation, severe calcified PVD. He was admitted per [facility]ics were consulted.  The patient was planned for revision of right hip on 4/14.  Per report, overnight into early 4/14, he had saturations into the 80's.  On arrival to the pre-op area his saturations were in the 60's.  The patient was intubated.  ABG was assessed which showed hypercarbic respiratory failure.  His case lasted approximately 4 hours.  Last fentanyl dosing approximately 11 am.  Post operatively, he was extubated but remained lethargic with ABG consistent with hypercarbic respiratory failure.  Notable labs include sr cr 1.95 (baseline ~ 1.3-1.4), WBC 22.7.   ? ?PCCM consulted for respiratory failure, AMS. ? 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 Dorris Fetch 2020 ?Empyema ?HTN  ?PVD ? ?Significant Hospital  Events: ?Including procedures, antibiotic start and stop dates in addition to other pertinent events   ?4/12 Admit  ?4/14 To OR for revision of right hip, hypercarbic / sleepy post surgery, PCCM consulted with airway concerns ? ?Interim History / Subjective:  ?As above  ? ?Objective   ?Blood pressure (!) 136/59, pulse 83, temperature 97.7 ?F (36.5 ?C), resp. rate 14, height 5\' 10"  (1.778 m), weight 73.6 kg, SpO2 91 %. ?   ?   ? ?Intake/Output Summary (Last 24 hours) at 05/21/2021 1515 ?Last data filed at 05/21/2021 1252 ?Gross per 24 hour  ?Intake 3300 ml  ?Output 1350 ml  ?Net 1950 ml  ? ?Filed Weights  ? 05/19/21 1536  ?Weight: 73.6 kg  ? ? ?Examination: ?General: thin, elderly male lying in bed  ?HENT: MM pink/dry, edentulous, anicteric  ?Lungs: non-labored at rest, lungs bilaterally clear, upper airway gurgling  ?Cardiovascular: S1S2 RRR, no m/r/g ?Abdomen: soft/non-tender, bsx4 active  ?Extremities: warm/dry, R hip wound VAC in place ?Neuro: lying in bed with eyes closed, initially no response to verbal stimuli, with sternal rub patient raises up out of bed and opens eyes, falls back to sleep, sedate / non-focal on exam  ? ?Resolved Hospital Problem list   ?  ? ?Assessment & Plan:  ? ?Acute Hypercarbic / Hypoxic Respiratory Failure  ?Chronic Hypoxic Respiratory Failure ?COPD w/o Exacerbation, Hx Lobectomy on R, Hx Bronchiectasis  ?Previously wore O2 after lobectomy in 2020. Not on home O2 currently. [facility]   ?-  wean O2 for sats 88-92% ?-pulmonary hygiene  ?-HOB elevated  ?-Duoneb Q4  ?-expand abx for PNA coverage  ?-hold Dulera as patient not awake enough to adequately inhale medication  ? ?Right Periprosthetic Hip Fracture  ?OR 4/14 with removal and revision of loose femoral implant, post mechanical fall  ?-ortho rec's 20% partial weight bearing on RLE, wound VAC on discharge and prophylaxis with lovenox 40 mg QDx4 weeks ?-post operative care per Ortho  ? ?Acute Metabolic  Encephalopathy  ?In setting of hypercarbia, post anesthesia  ?-minimize sedating medications  ?-remove fentanyl, oxy from [facility] for now  ?-frequent reorientation  ? ?Leukocytosis  ?Reactive vs aspiration  ?-expand abx to zosyn ?-await surgical PCR for MRSA, may need vanco ?-ortho rec's for 1 week of abx (cefadroxil 500 mg BID 7days) ?-follow surgical wound cultures  ? ?AKI on CKD IIIa ?-IVF at 40ml/hr  ?-Trend BMP / urinary output ?-Replace electrolytes as indicated ?-Avoid nephrotoxic agents, ensure adequate renal perfusion ? ?Hx Dysphagia  ?-will need SLP evaluation once more alert  ? ?Anemia  ?-trend CBC  ?-transfuse for Hgb <7% or active bleeding  ? ?PVD  ?-hold plavix 4/14 ?-continue ASA   ? ?HLD  ?-continue statin  ? ?Depression / Anxiety  ?-continue zoloft when able to take PO's  ? ?At Risk Malnutrition (POA) ?NPO status, decreased muscle mass ?-nutrition when safe for PO intake  ? ?Best Practice (right click and "Reselect all SmartList Selections" daily)  ?Diet/type: NPO ?DVT prophylaxis: LMWH ?GI prophylaxis: N/A ?Lines: N/A, Arterial Line, and No longer needed.  Order written to d/c  ?Foley:  N/A ?Code Status:  full code ?Last date of multidisciplinary goals of care discussion: per Dr. Chestine Spore 3/14 ? ?Labs   ?CBC: ?Recent Labs  ?Lab 05/19/21 ?1645 05/20/21 ?0428 05/21/21 ?0303 05/21/21 ?8119 05/21/21 ?1331  ?WBC 14.6* 12.2* 22.7*  --   --   ?NEUTROABS 12.0*  --   --   --   --   ?HGB 10.4* 10.2* 10.7* 10.2* 8.8*  ?HCT 34.5* 32.8* 34.7* 30.0* 26.0*  ?MCV 92.2 88.4 88.5  --   --   ?PLT 207 227 236  --   --   ? ? ?Basic Metabolic Panel: ?Recent Labs  ?Lab 05/19/21 ?1645 05/20/21 ?0428 05/21/21 ?0303 05/21/21 ?1478 05/21/21 ?1331  ?NA 137 136 136 138 139  ?K 4.6 4.9 4.2 4.6 4.7  ?CL 107 104 104  --   --   ?CO2 21* 25 19*  --   --   ?GLUCOSE 89 85 78  --   --   ?BUN 25* 27* 36*  --   --   ?CREATININE 1.40* 1.47* 1.95*  --   --   ?CALCIUM 8.2* 8.7* 8.6*  --   --   ?MG  --  2.2  --   --   --   ?PHOS  --  4.1  --   --    --   ? ?GFR: ?Estimated Creatinine Clearance: 32.2 mL/min (A) (by C-G formula based on SCr of 1.95 mg/dL (H)). ?Recent Labs  ?Lab 05/19/21 ?1645 05/20/21 ?0428 05/21/21 ?0303  ?WBC 14.6* 12.2* 22.7*  ? ? ?Liver Function Tests: ?Recent Labs  ?Lab 05/20/21 ?0428 05/21/21 ?0303  ?AST 32 45*  ?ALT 19 22  ?ALKPHOS 110 106  ?BILITOT 1.0 1.5*  ?PROT 6.7 6.6  ?ALBUMIN 3.4* 3.0*  ? ?No results for input(s): LIPASE, AMYLASE in the last 168 hours. ?No results for input(s): AMMONIA in the last 168 hours. ? ?  ABG ?   ?Component Value Date/Time  ? PHART 7.260 (L) 05/21/2021 1331  ? PCO2ART 54.4 (H) 05/21/2021 1331  ? PO2ART 72 (L) 05/21/2021 1331  ? HCO3 24.6 05/21/2021 1331  ? TCO2 26 05/21/2021 1331  ? ACIDBASEDEF 3.0 (H) 05/21/2021 1331  ? O2SAT 92 05/21/2021 1331  ?  ? ?Coagulation Profile: ?Recent Labs  ?Lab 05/19/21 ?1645  ?INR 1.0  ? ? ?Cardiac Enzymes: ?No results for input(s): CKTOTAL, CKMB, CKMBINDEX, TROPONINI in the last 168 hours. ? ?HbA1C: ?No results found for: HGBA1C ? ?CBG: ?No results for input(s): GLUCAP in the last 168 hours. ? ?Review of Systems:   ?Unable to complete as patient is altered post surgery.  ? ?Past Medical History:  ?He,  has a past medical history of Anxiety, Arthritis, Bleeding stomach ulcer (2000s), COPD (chronic obstructive pulmonary disease) (HCC), Cough, Depression, Dyspnea, Hypertension, Pneumonia, and PVD (peripheral vascular disease) (HCC) (2014).  ? ?Surgical History:  ? ?Past Surgical History:  ?Procedure Laterality Date  ? ABDOMINAL AORTOGRAM W/LOWER EXTREMITY Bilateral 03/05/2021  ? Procedure: ABDOMINAL AORTOGRAM W/LOWER EXTREMITY;  Surgeon: Leonie Douglas, MD;  Location: [facility] INVASIVE CV LAB;  Service: Cardiovascular;  Laterality: Bilateral;  ? IR ANGIOGRAM SELECTIVE EACH ADDITIONAL VESSEL  07/26/2018  ? IR ANGIOGRAM SELECTIVE EACH ADDITIONAL VESSEL  07/26/2018  ? IR ANGIOGRAM SELECTIVE EACH ADDITIONAL VESSEL  07/26/2018  ? IR EMBO ART  VEN HEMORR LYMPH EXTRAV  INC GUIDE ROADMAPPING   07/26/2018  ? IR GASTROSTOMY TUBE MOD SED  12/14/2018  ? IR US GUIDE VASC ACCESS RIGHT  07/26/2018  ? JOINT REPLACEMENT    ? KNEE ARTHROSCOPY Right X 1  ? PERIPHERAL VASCULAR BALLOON ANGIOPLASTY  03/05/2021  ? Procedure: PE

## 2021-05-21 NOTE — Progress Notes (Signed)
An USGPIV (ultrasound guided PIV) has been placed for short-term vasopressor infusion. A correctly placed ivWatch must be used when administering Vasopressors. Should this treatment be needed beyond 72 hours, central line access should be obtained.  It will be the responsibility of the bedside nurse to follow best practice to prevent extravasations.   ?

## 2021-05-21 NOTE — Transfer of Care (Signed)
Immediate Anesthesia Transfer of Care Note ? ?Patient: William Kirk ? ?Procedure(s) Performed: REVISION HIP REPLACEMENT (Right: Hip) ? ?Patient Location: PACU ? ?Anesthesia Type:General ? ?Level of Consciousness: unresponsive and patient cooperative ? ?Airway & Oxygen Therapy: Patient Spontanous Breathing and Patient connected to face mask oxygen ? ?Post-op Assessment: Report given to RN and Post -op Vital signs reviewed and stable ? ?Post vital signs: Reviewed ? ?Last Vitals:  ?Vitals Value Taken Time  ?BP 98/43 05/21/21 1317  ?Temp    ?Pulse 70 05/21/21 1320  ?Resp 12 05/21/21 1320  ?SpO2 98 % 05/21/21 1320  ?Vitals shown include unvalidated device data. ? ?Last Pain:  ?Vitals:  ? 05/21/21 0700  ?TempSrc: Axillary  ?PainSc:   ?   ? ?  ? ?Complications: No notable events documented. ?

## 2021-05-21 NOTE — Addendum Note (Signed)
Addendum  created 05/21/21 1515 by [name], MD  ? Clinical Note Signed  ?  ?

## 2021-05-21 NOTE — Progress Notes (Addendum)
Pharmacy Antibiotic Note ? ?William Kirk is a 81 y.o. male admitted on 05/19/2021 with  asp pna .  Pharmacy has been consulted for Zosyn dosing. He finished a course of levofloxacin last week for pneumonia. ? ?Plan: ?Zosyn 3.375gm IV q8h ?Will f/u renal function, micro data, and pts' clinical condition ? ?Height: '5\' 10"'$  (177.8 cm) ?Weight: 73.6 kg (162 lb 3.2 oz) ?IBW/kg (Calculated) : 73 ? ?Temp (24hrs), Avg:98.4 ?F (36.9 ?C), Min:97.7 ?F (36.5 ?C), Max:100 ?F (37.8 ?C) ? ?Recent Labs  ?Lab 05/19/21 ?0321 05/20/21 ?0428 05/21/21 ?0303  ?WBC 14.6* 12.2* 22.7*  ?CREATININE 1.40* 1.47* 1.95*  ?  ?Estimated Creatinine Clearance: 32.2 mL/min (A) (by C-G formula based on SCr of 1.95 mg/dL (H)).   ? ?Allergies  ?Allergen Reactions  ? Testosterone Rash  ? ? ?Antimicrobials this admission: ?4/14 Zosyn >>  ?4/14 Ancef x 1 (pre-op) ? ?Microbiology results: ?4/14 R hip wound: ngtd ? ?Thank you for allowing pharmacy to be a part of this patient?s care. ? ?Sherlon Handing, PharmD, BCPS ?Please see amion for complete clinical pharmacist phone list ?05/21/2021 5:21 PM ? ?

## 2021-05-21 NOTE — Interval H&P Note (Signed)
The patient has been re-examined, and the chart reviewed, and there have been no interval changes to the documented history and physical.   ? ?Patient delirious this AM, called wife to confirm plan for surgery and obtain phone consent. Patient requiring supplemental oxygen hx of pulmonary issues and recent pneumonia. Discussed with anesthesia who feels okay to proceed. Discussed increased risk of remaining intubated post op and needing to go to ICU. ? ?Plan for ORIF and revision THA of periprosthetic femur fracture. ? ?The operative side was examined and the patient was confirmed to have intact, Motor EHL, ext, flex 5/5, and DP 2+, PT 2+, No significant edema. Patient not cooperating with sensory exam this AM due to delerium but did have intact sensation yesterday when evaluated. ? ? ?The risks, benefits, and alternatives have been discussed at length with patient yesterday and his wife this morning, and they are willing to proceed.  Right hip marked. Consent has been signed. ? ?

## 2021-05-21 NOTE — Anesthesia Procedure Notes (Signed)
Procedure Name: Intubation ?Date/Time: 05/21/2021 8:13 AM ?Performed by: Jenne Campus, CRNA ?Pre-anesthesia Checklist: Patient identified, Emergency Drugs available, Suction available and Patient being monitored ?Patient Re-evaluated:Patient Re-evaluated prior to induction ?Oxygen Delivery Method: Circle System Utilized ?Preoxygenation: Pre-oxygenation with 100% oxygen ?Induction Type: IV induction ?Ventilation: Mask ventilation without difficulty ?Laryngoscope Size: Mac and 4 ?Grade View: Grade I ?Tube type: Oral ?Tube size: 7.5 mm ?Number of attempts: 1 ?Airway Equipment and Method: Stylet and Oral airway ?Placement Confirmation: ETT inserted through vocal cords under direct vision, positive ETCO2 and breath sounds checked- equal and bilateral ?Secured at: 22 cm ?Tube secured with: Tape ?Dental Injury: Teeth and Oropharynx as per pre-operative assessment  ?Comments: Atraumatic oral intubation by Legrand Como paramedic student. CRNA and anesthesiologist present. ? ? ? ? ?

## 2021-05-21 NOTE — Op Note (Signed)
05/19/2021 - 05/21/2021 ? ?12:06 PM ? ?PATIENT:  William Kirk  ? ?MRN: [id number] ? ?PRE-OPERATIVE DIAGNOSIS: Periprosthetic fracture [place] B2 with loose implant, metal-on-metal total hip replacement ? ?POST-OPERATIVE DIAGNOSIS:  same ? ?PROCEDURE:  Procedure(s): ?Open reduction internal fixation of periprosthetic right femur fracture removal of loose femoral implant, revision to modular diaphyseal engaging revision femoral component and dual mobility head ? ?PREOPERATIVE INDICATIONS:   ? ?William Kirk is an 81 y.o. male who has a diagnosis of Periprosthetic fracture [place] B2 with loose implant and elected for surgical management after failing conservative treatment.  The risks benefits and alternatives were discussed with the patient including but not limited to the risks of nonoperative treatment, versus surgical intervention including infection, bleeding, nerve injury, periprosthetic fracture, the need for revision surgery, dislocation, leg length discrepancy, blood clots, cardiopulmonary complications, morbidity, mortality, among others, and they were willing to proceed.   ? ? ?OPERATIVE REPORT  ?   ?SURGEON:  Charlies Constable, MD ?   ?ASSISTANT: Nehemiah Massed, PA-C, (Present throughout the entire procedure,  necessary for completion of procedure in a timely manner, assisting with retraction, instrumentation, and closure)  ?   ?ANESTHESIA: General ? ?ESTIMATED BLOOD LOSS: 500cc ?   ?COMPLICATIONS:  None.  ?   ?UNIQUE ASPECTS OF THE CASE: Loose and subsided femoral component, significant trunnion corrosion, evidence of metal corrosion in the trochanteric bursa, no significant abductor diameter necrosis, well-positioned BHR metal acetabular component without evidence of corrosion. ? ?COMPONENTS:   ?Stryker restoration modular 195 mm x 20 mm distal stem, 25+0 proximal body, 28-4 Stryker Biolox delta ceramic femoral head 56 x 28 mm dual mobility head ball from [employer], 2.0 Dall-Miles cables  x7 ?Implant Name Type Inv. Item Serial No. Manufacturer Lot No. LRB No. Used Action  ?SLEEVE CABLE 2MM VT - [id number] Orthopedic Implant SLEEVE CABLE 2MM VT  [employer] [id number] Right 1 Implanted  ?SLEEVE CABLE 2MM VT - [id number] Orthopedic Implant SLEEVE CABLE 2MM VT  [employer] [id number] Right 1 Implanted  ?SLEEVE CABLE 2MM VT - [id number] Orthopedic Implant SLEEVE CABLE 2MM VT  [employer] [id number] Right 1 Implanted  ?SLEEVE CABLE 2MM VT - [id number] Orthopedic Implant SLEEVE CABLE 2MM VT  [employer] [id number] Right 1 Implanted  ?SLEEVE CABLE 2MM VT - [id number] Orthopedic Implant SLEEVE CABLE 2MM VT  [employer] [id number] Right 1 Implanted  ?SLEEVE CABLE 2MM VT - [id number] Orthopedic Implant SLEEVE CABLE 2MM VT  [employer] [id number] Right 1 Implanted  ?SLEEVE CABLE 2MM VT - [id number] Orthopedic Implant SLEEVE CABLE 2MM VT  [employer] [id number] Right 1 Implanted  ?STEM FEM DIST STRAIGHT 27N170 - [id number] Stem STEM FEM DIST STRAIGHT [id number]  [employer] [id number] Right 1 Implanted  ?REST MOD HIP SYS SZ 55MMP0 V40 - [id number] Hips REST MOD HIP SYS SZ 55MMP0 V40  [employer] [id number] Right 1 Implanted  ?[place] Hip Dual Mobility XLPE Insert    SMITH NEPHEW [employer]ver [id number] Right 1 Implanted  ?FEMORAL HEAD V40 CERAMIC 28-4 - [id number] Head FEMORAL HEAD V40 CERAMIC 28-4  [employer] [id number] Right 1 Implanted  ? ?   ?PROCEDURE IN DETAIL:  ? ?The patient was met in the holding area and  identified.  The appropriate hip was identified and marked at the operative site.  The patient was then transported to the OR  and  placed under anesthesia.  At that point, the patient was  placed in the lateral decubitus position with the operative side up and  secured to the operating room table  and all bony prominences padded. A subaxillary role was also placed. ?   ?The operative lower extremity was prepped from the iliac crest to the distal leg.   Sterile draping was performed.  Time out was performed prior to incision.   ?   ?The patient's prior posterior incision measuring approximately 10 cm noted to be for posterior for routine posterolateral approach.  Made a new incision approximately 5 cm anterior to the patient's old incision. A routine posterolateral approach was utilized via sharp dissection  carried down to the subcutaneous tissue.  The incision was extended far distal along the lateral side of the thigh.  Gross bleeders were Bovie coagulated.  The iliotibial band was identified and incised along the length of the skin incision through the glute max fascia.  Distally the IT band was incised exposing the vastus lateralis.  Internally rotated allowing [place] to visualize the posterior aspect of the vastus lateralis and the last was incised with about a 1 cm posterior cuff for closure.  We dissected down through the muscle to the lateral femur. The distal spike of the fracture was visualized. 2 Dall miles cables were placed circumferentially about 5cms distal to the fracture. This was to help prevent distal propagation of the fracture.  We next turned our attention to exposing the hip.  Charnley retractor was placed with care to protect the sciatic nerve posteriorly.  The trochanteric bursa had a gray color consistent with metal corrosion and this was resected.  With the hip internally rotated a capsulotomy was then performed off the femoral insertion and also tagged with a #2 Ethibond.  3 tissue specimens were sent for culture from the trochanteric bursa, acetabulum, and femur.  These were sent for aerobic anaerobic culture. ? ?Using a bone hook and gentle gradual internal rotation the hip was carefully dislocated.  The head ball was easily disimpacted.  There was erosion appreciated on the trunnion taper, no significant corrosion in the acetabular cup.  ? ?The stem was noted to be significantly subsided almost down to the level of the lesser trochanter.   Using a burr carefully resected bone circumferentially around the proximal aspect of the implant.  Care was taken to resect bone from the shoulder along the medial trochanter.  Once this bone was resected the stem was noted to be rotationally unstable.  We used flexible osteotomes to work around the stem circumferentially.  We then continued the greater trochanter to allow the stem to be removed without fracture to the trochanter.  Using a hook around the trunnion a back slap or the stem was easily removed. ? ?We next turned our attention to reducing the fracture and cabling the femur..  With gentle traction and visualization of the fracture it was easily reduced.  While holding reduction 5 Dall-Miles cables were passed around the proximal femur.  These were tensioned each to approximately 100 foot pounds and cut.  The fracture was visualized and had anatomic reduction. ? ?The leg was then carefully internally rotated, and the femur had good stability with no concerns for displacement or loss of reduction.  We then started reaming for the revision stem.  We started reaming with a 11 reamer and worked up to a 20 millimeter reamer.  Fluoroscopy was intermittently checked to make sure we were not in varus or apex anterior.  We also checked for appropriate depth.  In order to appropriately bypass the fracture had to ream for the longer  195 mm diaphyseal stem.  With a 20 mm reamer had good cortical purchase and stability.  The real 195 x 20 mm reamer implant was opened and impacted to the same depth as the reamer set there was good rotational and axial stability. ? ?We next turned our attention to reaming for the proximal body.  We reamed up to the 25 mm proximal body.  We took a +0 proximal body trial.  The proximal body was put in about 30 degrees of anteversion.  Using a trial head the hip was reduced.  We first attempted to use a +0 ball but this was too tight and unable to reduce.  We then tried a -4 ball which  were able to reduce with good stability. ?   ?  There was no impingement with full extension and 90 degrees external rotation.  The hip was stable at the position of sleep and with 90 degrees flexion and 90 de

## 2021-05-21 NOTE — Discharge Instructions (Signed)
Diet: As you were doing prior to hospitalization  ? ?Shower:  May shower but keep the wounds dry, use an occlusive plastic wrap, NO SOAKING IN TUB.  If the bandage gets wet, change with a clean dry gauze.  If you have a splint on, leave the splint in place and keep the splint dry with a plastic bag. ? ?Dressing:  Incisional wound VAC dressing to be changed 7 days post discharge in the office. ? ?Activity:  Increase activity slowly as tolerated, but follow the weight bearing instructions below.  The rules on driving is that you can not be taking narcotics while you drive, and you must feel in control of the vehicle.   ? ?Weight Bearing:  20% partial weight bearing with a walker for 6 weeks..   ? ?Blood clot prevention (DVT Prophylaxis): After surgery you are at an increased risk for a blood clot. you were prescribed a blood thinner, lovenox, to be taken once daily for a total of 4 weeks from surgery to help reduce your risk of getting a blood clot. This will help prevent a blood clot. Signs of a pulmonary embolus (blood clot in the lungs) include sudden short of breath, feeling lightheaded or dizzy, chest pain with a deep breath, rapid pulse rapid breathing. Signs of a blood clot in your arms or legs include new unexplained swelling and cramping, warm, red or darkened skin around the painful area. Please call the office or 911 right away if these signs or symptoms develop. ?To prevent constipation: you may use a stool softener such as - ? ?Colace (over the counter) 100 mg by mouth twice a day  ?Drink plenty of fluids (prune juice may be helpful) and high fiber foods ?Miralax (over the counter) for constipation as needed.   ? ?Itching:  If you experience itching with your medications, try taking only a single pain pill, or even half a pain pill at a time.  You may take up to 10 pain pills per day, and you can also use benadryl over the counter for itching or also to help with sleep.  ? ?Precautions:  If you experience  chest pain or shortness of breath - call 911 immediately for transfer to the hospital emergency department!! ? ? ?Call office [phone]) for the following: ?Temperature greater than 101F ?Persistent nausea and vomiting ?Severe uncontrolled pain ?Redness, tenderness, or signs of infection (pain, swelling, redness, odor or green/yellow discharge around the site) ?Difficulty breathing, headache or visual disturbances ?Hives ?Persistent dizziness or light-headedness ?Extreme fatigue ?Any other questions or concerns you may have after discharge ? ?In an emergency, call 911 or go to an Emergency Department at a nearby hospital ? ?Follow- Up Appointment:  Please call for an appointment to be seen 1 week after discharge from the hospital in [facility] with your surgeon Dr. Charlies Constable - [phone] ?Address: [address] [place], [place], [place] [address] ? ? ?  ?

## 2021-05-21 NOTE — Progress Notes (Addendum)
?PROGRESS NOTE ? ?William Kirk WUJ:[id number] DOB: [birth year] DOA: 05/19/2021 ?PCP: Floreen Comber, PA-C ? ? LOS: 2 days  ? ?Brief Narrative / Interim history: ?81 year old male with PVD, COPD, HLD, CKD 3A who comes into the hospital after falling.  This happened while going up the steps at home, fell backwards landed on his right hip.  No loss of consciousness, no chest pain, no shortness of breath.  He was complaining of right hip pain and difficulties walking.  He has a history of right hip surgery about 5 years ago at [facility].  Orthopedic surgery consulted, he is s/p ORIF of periprosthetic right femur fracture with removal and revision of loose femoral implant ? ?Subjective / 24h Interval events: ?Seen in PACU, just came out of the OR, not waking up yet ? ?Assesement and Plan: ?Principal Problem: ?  Right femoral fracture (HCC) ?Active Problems: ?  Depression ?  Chronic obstructive pulmonary disease (HCC) ?  AKI (acute kidney injury) (HCC) ?  Status post lobectomy of lung ?  Dehydration ?  Leukocytosis ?  Mixed hyperlipidemia ?  Chronic kidney disease, stage 3a (HCC) ?  Acute on chronic respiratory failure with hypoxia and hypercapnia (HCC) ?  Respiratory acidosis ? ? ?Assessment and Plan: ?Principal problem ?Right periprosthetic hip fracture-orthopedic surgery consulted, patient was taken to the OR today and is now s/p ORIF of periprosthetic right femur fracture with removal and revision of loose femoral implant.  Ortho recommends 20% partial weightbearing on the right lower extremities, wound VAC on discharge and prophylaxis with Lovenox 40 mg daily for 4 weeks. ? ?Active problems ?Leukocytosis-likely reactive but worsening today.  He will be on antibiotics for a week per orthopedic surgery, receiving Ancef.  And to be transition to cefadroxil 500 mg twice daily x7 days. ? ?Acute on chronic hypoxic respiratory failure, acute respiratory acidosis-apparently, overnight, patient was in the 80s having  to have his oxygen increased to 4 L.  After being transferred from the floor to preoperative area, was found to be satting in the 60s and poorly responsive.  He was intubated and eventually taken to the OR for repair as above.  Currently he is poorly responsive, extubated, lungs sound gurgly on exam.  Difficult to tell whether this is due to anesthesia hanging around order some other issues.  Obtain a chest x-ray, obtain an ABG.  Transfer from MedSurg to progressive for closer monitoring.  Discussed with the bedside RN in PACU as well as the CRNA.  Prior to extubation a lot of secretions were suctioned.  An ABG done about 4 hours ago showed mild respiratory acidosis  ? ?Acute metabolic encephalopathy-on admission, very confused in the ER, suspect related to #1 as well as narcotic medications.  Currently still with lingering anesthesia, difficult to assess mental status ? ?Peripheral vascular disease-continue aspirin, statin.  Hold Plavix perioperatively ? ?Hyperlipidemia-continue statin ? ?COPD, history of lung lobectomy-respiratory status worse as above. ? ?Acute kidney injury on chronic kidney disease stage IIIa-baseline creatinine around 1.4, increased to 1.9 this morning, patient had acute urinary retention yesterday which is probably the culprit.  He was also NPO.  Continue fluids, Foley catheter ? ?Depression-continue Zoloft ? ? ?Scheduled Meds: ? [MAR Hold] aspirin EC  81 mg Oral Daily  ? [MAR Hold] atorvastatin  80 mg Oral QHS  ? [MAR Hold] Chlorhexidine Gluconate Cloth  6 each Topical Daily  ? [MAR Hold] gabapentin  800 mg Oral BID  ? [MAR Hold] metoprolol tartrate  12.5 mg  Oral Daily  ? [MAR Hold] mometasone-formoterol  2 puff Inhalation BID  ? [MAR Hold] sertraline  200 mg Per Tube Daily  ? [MAR Hold] traZODone  50 mg Oral QHS  ? [MAR Hold] umeclidinium bromide  1 puff Inhalation q AM  ? ?Continuous Infusions: ? sodium chloride    ? ?PRN Meds:.[MAR Hold] acetaminophen **OR** [MAR Hold] acetaminophen, [MAR  Hold] albuterol, amisulpride, fentaNYL (SUBLIMAZE) injection, [MAR Hold] haloperidol lactate, [MAR Hold] methocarbamol, [MAR Hold]  morphine injection, ondansetron (ZOFRAN) IV, [MAR Hold] oxyCODONE, oxyCODONE **OR** oxyCODONE ? ?Diet Orders (From admission, onward)  ? ?  Start     Ordered  ? 05/21/21 0001  Diet NPO time specified Except for: Sips with Meds  Diet effective midnight       ?Question:  Except for  Answer:  Clearance Coots with Meds  ? 05/20/21 1236  ? ?  ?  ? ?  ? ? ?DVT prophylaxis: SCDs Start: 05/20/21 0011 ? ? ?Lab Results  ?Component Value Date  ? PLT 236 05/21/2021  ? ? ?  Code Status: Full Code ? ?Family Communication: No family at bedside ? ?Status is: Inpatient ? ?Remains inpatient appropriate because: hip fracture ? ? ?Level of care: Progressive ? ?Consultants:  ?Orthopedic surgery  ? ?Procedures:  ?none ? ?Microbiology  ?none ? ?Antimicrobials: ?none  ? ? ?Objective: ?Vitals:  ? 05/21/21 0300 05/21/21 0700 05/21/21 0740 05/21/21 1305  ?BP: (!) 132/51  (!) 137/53   ?Pulse: 100  (!) 113   ?Resp: 20  (!) 21   ?Temp: 98 ?F (36.7 ?C) 100 ?F (37.8 ?C)  97.7 ?F (36.5 ?C)  ?TempSrc:  Axillary    ?SpO2: (!) 86% (!) 67% 99%   ?Weight:      ?Height:      ? ? ?Intake/Output Summary (Last 24 hours) at 05/21/2021 1336 ?Last data filed at 05/21/2021 1252 ?Gross per 24 hour  ?Intake 3300 ml  ?Output 1350 ml  ?Net 1950 ml  ? ?Wt Readings from Last 3 Encounters:  ?05/19/21 73.6 kg  ?05/07/21 73.6 kg  ?03/30/21 75.8 kg  ? ? ?Examination: ? ?Constitutional: Poorly responsive ?Eyes: lids and conjunctivae normal, no scleral icterus ?ENMT: mmm ?Neck: normal, supple ?Respiratory: Gurgling breath sounds bilaterally, no wheezing ?Cardiovascular: Regular rate and rhythm, no murmurs / rubs / gallops.  ?Abdomen: soft, no distention, no tenderness. Bowel sounds positive.  ?Skin: no rashes ?Neurologic: Does not participate in neuro exam ? ?Data Reviewed: I have independently reviewed following labs and imaging studies  ? ?CBC ?Recent  Labs  ?Lab 05/19/21 ?1645 05/20/21 ?0428 05/21/21 ?0303 05/21/21 ?9562  ?WBC 14.6* 12.2* 22.7*  --   ?HGB 10.4* 10.2* 10.7* 10.2*  ?HCT 34.5* 32.8* 34.7* 30.0*  ?PLT 207 227 236  --   ?MCV 92.2 88.4 88.5  --   ?MCH 27.8 27.5 27.3  --   ?MCHC 30.1 31.1 30.8  --   ?RDW 16.5* 16.4* 16.5*  --   ?LYMPHSABS 1.4  --   --   --   ?MONOABS 0.9  --   --   --   ?EOSABS 0.1  --   --   --   ?BASOSABS 0.1  --   --   --   ? ? ?Recent Labs  ?Lab 05/19/21 ?1645 05/20/21 ?0428 05/21/21 ?0303 05/21/21 ?1308  ?NA 137 136 136 138  ?K 4.6 4.9 4.2 4.6  ?CL 107 104 104  --   ?CO2 21* 25 19*  --   ?GLUCOSE 89  85 78  --   ?BUN 25* 27* 36*  --   ?CREATININE 1.40* 1.47* 1.95*  --   ?CALCIUM 8.2* 8.7* 8.6*  --   ?AST  --  32 45*  --   ?ALT  --  19 22  --   ?ALKPHOS  --  110 106  --   ?BILITOT  --  1.0 1.5*  --   ?ALBUMIN  --  3.4* 3.0*  --   ?MG  --  2.2  --   --   ?INR 1.0  --   --   --   ? ? ?------------------------------------------------------------------------------------------------------------------ ?No results for input(s): CHOL, HDL, LDLCALC, TRIG, CHOLHDL, LDLDIRECT in the last 72 hours. ? ?No results found for: HGBA1C ?------------------------------------------------------------------------------------------------------------------ ?No results for input(s): TSH, T4TOTAL, T3FREE, THYROIDAB in the last 72 hours. ? ?Invalid input(s): FREET3 ? ?Cardiac Enzymes ?No results for input(s): CKMB, TROPONINI, MYOGLOBIN in the last 168 hours. ? ?Invalid input(s): CK ?------------------------------------------------------------------------------------------------------------------ ?   ?Component Value Date/Time  ? BNP 113.2 (H) 07/07/2018 1151  ? ? ?CBG: ?No results for input(s): GLUCAP in the last 168 hours. ? ?No results found for this or any previous visit (from the past 240 hour(s)).  ? ?Radiology Studies: ?DG C-Arm 1-60 Min-No Report ? ?Result Date: 05/21/2021 ?Fluoroscopy was utilized by the requesting physician.  No radiographic  interpretation.  ? ?DG C-Arm 1-60 Min-No Report ? ?Result Date: 05/21/2021 ?Fluoroscopy was utilized by the requesting physician.  No radiographic interpretation.  ? ?DG HIP UNILAT WITH PELVIS MIN 4 VIEWS RIGHT ?

## 2021-05-21 NOTE — Progress Notes (Signed)
The consent for ARTHROPLASTY BIPOLAR HIP(HEMIARTHROPLASTY) was obtained form [name] spouse William Kirk,William Kirk via phone at 4 am. William Kirk, William Kirk verbalized understanding and her questions been addressed.  ?

## 2021-05-21 NOTE — Anesthesia Postprocedure Evaluation (Addendum)
Anesthesia Post Note ? ?Patient: William Kirk ? ?Procedure(s) Performed: REVISION HIP REPLACEMENT (Right: Hip) ? ?  ? ?Patient location during evaluation: PACU ?Anesthesia Type: General ?Level of consciousness: lethargic, confused and responds to stimulation ?Pain management: pain level controlled ?Vital Signs Assessment: post-procedure vital signs reviewed and stable ?Respiratory status: spontaneous breathing, nonlabored ventilation, respiratory function stable and patient connected to nasal cannula oxygen ?Cardiovascular status: blood pressure returned to baseline and stable ?Postop Assessment: no apparent nausea or vomiting ?Anesthetic complications: no ?Comments: Patient is back to preop baseline mental status. He arouses to stimulation but does not respond to questions.  ? ? ?No notable events documented. ? ?Last Vitals:  ?Vitals:  ? 05/21/21 1350 05/21/21 1435  ?BP: (!) 98/53 (!) 136/59  ?Pulse: 73 83  ?Resp: 14 14  ?Temp:    ?SpO2: 95% 91%  ?  ?Last Pain:  ?Vitals:  ? 05/21/21 1435  ?TempSrc:   ?PainSc: Asleep  ? ? ?  ?  ?  ?  ?  ?  ? ?[name] ? ? ? ? ?

## 2021-05-21 NOTE — Progress Notes (Signed)
The patient is injury-free, afebrile, alert, and oriented X 1. Vital signs were within the baseline during this shift. His oxygen saturation drops to the high 80s when he sleeps, so McIntosh flow increased from 2 to 4 L/min. He complained of right leg pain, controlled with the current pain regimen. He has been NPO since midnight for the morning procedure. Pt denies chest pain, SOB, nausea, and dizziness. We will continue to monitor and work toward achieving the care plan goals ?

## 2021-05-22 ENCOUNTER — Inpatient Hospital Stay (HOSPITAL_COMMUNITY): Payer: No Typology Code available for payment source

## 2021-05-22 DIAGNOSIS — D649 Anemia, unspecified: Secondary | ICD-10-CM | POA: Diagnosis not present

## 2021-05-22 DIAGNOSIS — J9621 Acute and chronic respiratory failure with hypoxia: Secondary | ICD-10-CM | POA: Diagnosis not present

## 2021-05-22 DIAGNOSIS — S7291XD Unspecified fracture of right femur, subsequent encounter for closed fracture with routine healing: Secondary | ICD-10-CM | POA: Diagnosis not present

## 2021-05-22 DIAGNOSIS — N179 Acute kidney failure, unspecified: Secondary | ICD-10-CM | POA: Diagnosis not present

## 2021-05-22 LAB — CBC
HCT: 22.8 % — ABNORMAL LOW (ref 39.0–52.0)
HCT: 20.9 % — ABNORMAL LOW (ref 39.0–52.0)
Hemoglobin: 7.4 g/dL — ABNORMAL LOW (ref 13.0–17.0)
Hemoglobin: 6.7 g/dL — CL (ref 13.0–17.0)
MCH: 27.9 pg (ref 26.0–34.0)
MCH: 27.7 pg (ref 26.0–34.0)
MCHC: 32.5 g/dL (ref 30.0–36.0)
MCHC: 32.1 g/dL (ref 30.0–36.0)
MCV: 86 fL (ref 80.0–100.0)
MCV: 86.4 fL (ref 80.0–100.0)
Platelets: 196 10*3/uL (ref 150–400)
Platelets: 164 10*3/uL (ref 150–400)
RBC: 2.65 MIL/uL — ABNORMAL LOW (ref 4.22–5.81)
RBC: 2.42 MIL/uL — ABNORMAL LOW (ref 4.22–5.81)
RDW: 16.3 % — ABNORMAL HIGH (ref 11.5–15.5)
RDW: 16.3 % — ABNORMAL HIGH (ref 11.5–15.5)
WBC: 20.3 10*3/uL — ABNORMAL HIGH (ref 4.0–10.5)
WBC: 12.6 10*3/uL — ABNORMAL HIGH (ref 4.0–10.5)
nRBC: 0 % (ref 0.0–0.2)
nRBC: 0 % (ref 0.0–0.2)

## 2021-05-22 LAB — GLUCOSE, CAPILLARY
Glucose-Capillary: 127 mg/dL — ABNORMAL HIGH (ref 70–99)
Glucose-Capillary: 98 mg/dL (ref 70–99)
Glucose-Capillary: 103 mg/dL — ABNORMAL HIGH (ref 70–99)
Glucose-Capillary: 89 mg/dL (ref 70–99)

## 2021-05-22 LAB — COMPREHENSIVE METABOLIC PANEL
ALT: 29 U/L (ref 0–44)
AST: 89 U/L — ABNORMAL HIGH (ref 15–41)
Albumin: 2.3 g/dL — ABNORMAL LOW (ref 3.5–5.0)
Alkaline Phosphatase: 68 U/L (ref 38–126)
Anion gap: 5 (ref 5–15)
BUN: 37 mg/dL — ABNORMAL HIGH (ref 8–23)
CO2: 26 mmol/L (ref 22–32)
Calcium: 8.1 mg/dL — ABNORMAL LOW (ref 8.9–10.3)
Chloride: 108 mmol/L (ref 98–111)
Creatinine, Ser: 1.49 mg/dL — ABNORMAL HIGH (ref 0.61–1.24)
GFR, Estimated: 48 mL/min — ABNORMAL LOW (ref >60)
Glucose, Bld: 122 mg/dL — ABNORMAL HIGH (ref 70–99)
Potassium: 3.9 mmol/L (ref 3.5–5.1)
Sodium: 139 mmol/L (ref 135–145)
Total Bilirubin: 0.8 mg/dL (ref 0.3–1.2)
Total Protein: 5.2 g/dL — ABNORMAL LOW (ref 6.5–8.1)

## 2021-05-22 LAB — HEMOGLOBIN AND HEMATOCRIT, BLOOD
HCT: 20.2 % — ABNORMAL LOW (ref 39.0–52.0)
Hemoglobin: 6.5 g/dL — CL (ref 13.0–17.0)

## 2021-05-22 LAB — PREPARE RBC (CROSSMATCH)

## 2021-05-22 MED ORDER — SERTRALINE HCL 100 MG PO TABS
200.0000 mg | ORAL_TABLET | Freq: Every day | ORAL | Status: DC
  Administered 2021-05-22 – 2021-06-02 (×10): 200 mg via ORAL
  Filled 2021-05-22 (×5): qty 2
  Filled 2021-05-22: qty 4
  Filled 2021-05-22 (×4): qty 2

## 2021-05-22 MED ORDER — SODIUM CHLORIDE 0.9% IV SOLUTION: Freq: Once | INTRAVENOUS | Status: AC

## 2021-05-22 MED ORDER — SODIUM CHLORIDE 0.9% IV SOLUTION: Freq: Once | INTRAVENOUS | Status: DC

## 2021-05-22 MED ORDER — ACETAMINOPHEN 325 MG PO TABS
650.0000 mg | ORAL_TABLET | Freq: Once | ORAL | Status: AC
  Administered 2021-05-22: 650 mg via ORAL
  Filled 2021-05-22: qty 2

## 2021-05-22 MED ORDER — UMECLIDINIUM BROMIDE 62.5 MCG/ACT IN AEPB
1.0000 | INHALATION_SPRAY | Freq: Every morning | RESPIRATORY_TRACT | Status: DC
  Administered 2021-05-23 – 2021-06-02 (×11): 1 via RESPIRATORY_TRACT
  Filled 2021-05-22 (×2): qty 7

## 2021-05-22 MED ORDER — DEXTROSE IN LACTATED RINGERS 5 % IV SOLN: INTRAVENOUS | Status: DC

## 2021-05-22 MED ORDER — ASPIRIN 81 MG PO CHEW
81.0000 mg | CHEWABLE_TABLET | Freq: Every day | ORAL | Status: DC
  Administered 2021-05-23: 81 mg via ORAL
  Filled 2021-05-22: qty 1

## 2021-05-22 MED ORDER — IPRATROPIUM-ALBUTEROL 0.5-2.5 (3) MG/3ML IN SOLN
3.0000 mL | Freq: Four times a day (QID) | RESPIRATORY_TRACT | Status: DC
  Administered 2021-05-22 – 2021-05-23 (×5): 3 mL via RESPIRATORY_TRACT
  Filled 2021-05-22 (×5): qty 3

## 2021-05-22 NOTE — Progress Notes (Signed)
Orthopedic Tech Progress Note ?Patient Details:  ?LANKFORD GUTZMER ?01-06-1941 ?[id number] ? ? ?Ortho Devices ?Type of Ortho Device: Prafo boot/shoe ?Ortho Device/Splint Location: RLE ?Ortho Device/Splint Interventions: Ordered, Application ?  ?Post Interventions ?Patient Tolerated: Well ?Instructions Provided: Care of device ? ?Edgel Degnan Jeri Modena ?05/22/2021, 5:33 PM ? ?

## 2021-05-22 NOTE — Evaluation (Signed)
Clinical/Bedside Swallow Evaluation ?Patient Details  ?Name: William Kirk ?MRN: [id number] ?Date of Birth: 11/16/1940 ? ?Today's Date: 05/22/2021 ?Time: SLP Start Time (ACUTE ONLY): 1200 SLP Stop Time (ACUTE ONLY): 1215 ?SLP Time Calculation (min) (ACUTE ONLY): 15 min ? ?Past Medical History:  ?Past Medical History:  ?Diagnosis Date  ? Anxiety   ? Arthritis   ? "hands" (09/21/2016)  ? Bleeding stomach ulcer 2000s  ? COPD (chronic obstructive pulmonary disease) (HCC)   ? Cough   ? Depression   ? Dyspnea   ? Hypertension   ? Pneumonia   ? "now and several times before this" (09/21/2016)  ? PVD (peripheral vascular disease) (HCC) 2014  ? prev PTCA on RLE  ? ?Past Surgical History:  ?Past Surgical History:  ?Procedure Laterality Date  ? ABDOMINAL AORTOGRAM W/LOWER EXTREMITY Bilateral 03/05/2021  ? Procedure: ABDOMINAL AORTOGRAM W/LOWER EXTREMITY;  Surgeon: Leonie Douglas, MD;  Location: [facility];  Service: Cardiovascular;  Laterality: Bilateral;  ? IR ANGIOGRAM SELECTIVE EACH ADDITIONAL VESSEL  07/26/2018  ? IR ANGIOGRAM SELECTIVE EACH ADDITIONAL VESSEL  07/26/2018  ? IR ANGIOGRAM SELECTIVE EACH ADDITIONAL VESSEL  07/26/2018  ? IR EMBO ART  VEN HEMORR LYMPH EXTRAV  INC GUIDE ROADMAPPING  07/26/2018  ? IR GASTROSTOMY TUBE MOD SED  12/14/2018  ? IR US GUIDE VASC ACCESS RIGHT  07/26/2018  ? JOINT REPLACEMENT    ? KNEE ARTHROSCOPY Right X 1  ? PERIPHERAL VASCULAR BALLOON ANGIOPLASTY  03/05/2021  ? Procedure: PERIPHERAL VASCULAR BALLOON ANGIOPLASTY;  Surgeon: Leonie Douglas, MD;  Location: [facility];  Service: Cardiovascular;;  DCB Popliteal  ? PERIPHERAL VASCULAR INTERVENTION  03/05/2021  ? Procedure: PERIPHERAL VASCULAR INTERVENTION;  Surgeon: Leonie Douglas, MD;  Location: [facility];  Service: Cardiovascular;;  ? TOTAL HIP ARTHROPLASTY Right 2009  ? TRANSCAROTID ARTERY REVASCULARIZATION?  Right 02/24/2021  ? Procedure: RIGHT TRANSCAROTID ARTERY REVASCULARIZATION;  Surgeon: Leonie Douglas, MD;   Location: [facility] OR;  Service: Vascular;  Laterality: Right;  ? ULTRASOUND GUIDANCE FOR VASCULAR ACCESS Left 02/24/2021  ? Procedure: ULTRASOUND GUIDANCE FOR VASCULAR ACCESS;  Surgeon: Leonie Douglas, MD;  Location: [facility] OR;  Service: Vascular;  Laterality: Left;  ? VIDEO ASSISTED THORACOSCOPY (VATS)/ LOBECTOMY Right 10/18/2018  ? Procedure: VIDEO ASSISTED THORACOSCOPY (VATS)/ right lower LOBECTOMY;  Surgeon: Loreli Slot, MD;  Location: [facility] OR;  Service: Thoracic;  Laterality: Right;  ? VIDEO ASSISTED THORACOSCOPY (VATS)/DECORTICATION Right 11/09/2018  ? Procedure: REDO VIDEO ASSISTED THORACOSCOPY (VATS)/DECORTICATION/DRAIN EFFUSION;  Surgeon: Loreli Slot, MD;  Location: [facility] OR;  Service: Thoracic;  Laterality: Right;  ? VIDEO BRONCHOSCOPY N/A 07/27/2018  ? Procedure: VIDEO BRONCHOSCOPY WITHOUT FLUORO;  Surgeon: Leslye Peer, MD;  Location: [facility] OR;  Service: Cardiopulmonary;  Laterality: N/A;  ? VIDEO BRONCHOSCOPY N/A 11/09/2018  ? Procedure: VIDEO BRONCHOSCOPY;  Surgeon: Loreli Slot, MD;  Location: [facility] OR;  Service: Thoracic;  Laterality: N/A;  ? ?HPI:  ?William Kirk is a 81 y.o. male with medical history significant of PVD, COPD, hyperlipidemia, CKD 3A who presents to the emergency department via EMS from home after sustaining a mechanical fall PTA.  Patient tripped while going up the steps at home, he fell backwards landing on his right hip, he denies hitting his head and denies LOC.  He complained of right hip pain and difficulty in being able to bear weight on affected leg, EMS was activated and fentanyl was given prior to arrival.  He denies fever, chills,  headache, blurry vision, chest pain, shortness of breath, abdominal pain.  CHest xray is showing patchy opacities in the right upper and lower lung zones that appears unchanged from exam yesterday that was concerning for pneumonia.  ?  ?Assessment / Plan / Recommendation  ?Clinical Impression ? Clinical swallowing evaluation was  completed using thin liquids via spoon, cup and small straw sips, pureed material and dry solids.  He reported a history of dysphagia but forgot to mention he was supposed to tuck his chin with a head turn to the left for all intake per MBS completed on 12/16/2020.   He did cough in response to a decent amount of aspiration but not small amounts of aspiration during that study.  Cranial nerve exam was completed and unremarkable.  Lingual, labial, facial and jaw range of motion and strength were adequate and he did not endorse a difference on sensation from the right to left side of his face.  During PO trials he inconsistently recalled need to use chin tuck and head turn even given cues.   Overt s/s of aspiration were not seen but he was noted to silently aspirate during previous MBS.  Mastication of dry solids appeared adequate with good oral clearance.  RN gave meds whole in pureed material during this evaluation without obvious s/s of aspiration. Per RN the patient's wife reported that he takes medication whole in pureed material at home.  Given inconsistency with use of swallowing strategy, concern for possible right sided PNA and reason for hospital admission recommend he remain NPO except medication whole in pureed material and ice chips.  MBS will be completed to fully assess swallowing physiology safety. ?SLP Visit Diagnosis: Dysphagia, unspecified (R13.10) ?   ?Aspiration Risk ? Moderate aspiration risk  ?  ?Diet Recommendation   NPO except medication while in pureed material and ice chips pending MBS. ? ?Medication Administration: Whole meds with puree  ?  ?Other  Recommendations Oral Care Recommendations: Oral care BID   ? ?Recommendations for follow up therapy are one component of a multi-disciplinary discharge planning process, led by the attending physician.  Recommendations may be updated based on patient status, additional functional criteria and insurance authorization. ? ?Follow up Recommendations  Other (comment) (TBD)  ? ? ?  ?Assistance Recommended at Discharge Other (comment) (TBD)  ?Functional Status Assessment Patient has had a recent decline in their functional status and demonstrates the ability to make significant improvements in function in a reasonable and predictable amount of time.  ?Frequency and Duration min 2x/week  ?2 weeks ?  ?   ? ?Prognosis Prognosis for Safe Diet Advancement: Good ?Barriers to Reach Goals: Other (Comment) (known history of dyphagia)  ? ?  ? ?Swallow Study   ?General Date of Onset: 05/21/21 ?HPI: JARQUAVIOUS FAGANS is a 81 y.o. male with medical history significant of PVD, COPD, hyperlipidemia, CKD 3A who presents to the emergency department via EMS from home after sustaining a mechanical fall PTA.  Patient tripped while going up the steps at home, he fell backwards landing on his right hip, he denies hitting his head and denies LOC.  He complained of right hip pain and difficulty in being able to bear weight on affected leg, EMS was activated and fentanyl was given prior to arrival.  He denies fever, chills, headache, blurry vision, chest pain, shortness of breath, abdominal pain.  CHest xray is showing patchy opacities in the right upper and lower lung zones that appears unchanged from exam yesterday that  was concerning for pneumonia. ?Type of Study: Bedside Swallow Evaluation ?Previous Swallow Assessment: 12/16/2020  Reg/thin with chin tuck and head turn to the left. ?Diet Prior to this Study: NPO ?Temperature Spikes Noted: No ?Respiratory Status: Nasal cannula ?History of Recent Intubation: No ?Behavior/Cognition: Alert;Cooperative;Requires cueing ?Oral Cavity Assessment: Within Functional Limits ?Oral Care Completed by SLP: No ?Oral Cavity - Dentition: Dentures, bottom;Dentures, top ?Self-Feeding Abilities: Total assist ?Patient Positioning: Upright in bed ?Baseline Vocal Quality: Normal ?Volitional Swallow: Able to elicit  ?  ?Oral/Motor/Sensory Function Overall Oral  Motor/Sensory Function: Within functional limits   ?Ice Chips Ice chips: Not tested   ?Thin Liquid Thin Liquid: Within functional limits ?Presentation: Cup;Spoon;Straw  ?  ?Nectar Thick Nectar Thick Liquid: Not t

## 2021-05-22 NOTE — Progress Notes (Signed)
? ? ? ?  Subjective: ?Patient doing very well this morning.  Pain is well controlled.  More alert and oriented this morning following commands and answering questions appropriately.  He denies distal numbness and tingling, but is having difficulty dorsiflexing his ankle and toes on the right foot this morning. ? ?Objective:  ? ?VITALS:   ?Vitals:  ? 05/22/21 0300 05/22/21 0326 05/22/21 0500 05/22/21 0600  ?BP:      ?Pulse: 81  81 87  ?Resp: '17  18 16  '$ ?Temp:  98.5 ?F (36.9 ?C)    ?TempSrc:  Axillary    ?SpO2: 99%  99% 98%  ?Weight:      ?Height:      ? ? ?Sensation intact distally to the plantar and dorsal aspects of the foot ?Intact pulses distally ?Plantar flexion intact, but unable to demonstrate active ankle dorsiflexion or EHL function this AM ?Incision: prevena dressing holding suction, no output in cannsiter ?Compartment soft ? ? ?Lab Results  ?Component Value Date  ? WBC 20.3 (H) 05/22/2021  ? HGB 7.4 (L) 05/22/2021  ? HCT 22.8 (L) 05/22/2021  ? MCV 86.0 05/22/2021  ? PLT 196 05/22/2021  ? ?BMET ?   ?Component Value Date/Time  ? NA 139 05/22/2021 0522  ? K 3.9 05/22/2021 0522  ? CL 108 05/22/2021 0522  ? CO2 26 05/22/2021 0522  ? GLUCOSE 122 (H) 05/22/2021 0522  ? BUN 37 (H) 05/22/2021 0522  ? CREATININE 1.49 (H) 05/22/2021 0522  ? CALCIUM 8.1 (L) 05/22/2021 0522  ? GFRNONAA 48 (L) 05/22/2021 0522  ? ? ? ? ?Xray: Postop x-rays demonstrate excellent reduction of the periprosthetic fracture good alignment of hardware no adverse features. ? ?Assessment/Plan: ?1 Day Post-Op  ? ?Principal Problem: ?  Right femoral fracture ([place]) ?Active Problems: ?  Depression ?  Chronic obstructive pulmonary disease (HCC) ?  AKI (acute kidney injury) ([place]) ?  Status post lobectomy of lung ?  Dehydration ?  Leukocytosis ?  Mixed hyperlipidemia ?  Chronic kidney disease, stage 3a ([place]) ?  Acute on chronic respiratory failure with hypoxia and hypercapnia (HCC) ?  Respiratory acidosis ? ?S/p revision THA for periprosthetic femur  fracture 4/14 ? ?Post op foot drop, will continue to monitor. If not improving will need AFO. ? ?Post op recs: ?WB: 20% PWB RLE, posterior hip precautions x6 weeks ?Abx: ancef post op transition to cefadroxil 500BID x7 days upon discharge home  ?PT when able ?Imaging: PACU pelvis/femur Xray ?Dressing: prevena incisional wound vac to removed in clinic 1 week post op discharge or 2 weeks post op ?DVT prophylaxis: lovenox '40mg'$  daily x 4 weeks ?Follow up: 1 week post discharge for a wound check with Dr. Zachery Kirk at [facility].  ?Address: [address] [place], [place], [place] [address]  ?Office Phone: [phone] ? ? ? ?William Kirk A William Kirk ?05/22/2021, 7:32 AM ? ? ?Charlies Constable, MD ? ?Contact information:   ?Weekdays 7am-5pm epic message Dr. Zachery Kirk, or call office for patient follow up: [phone] ?After hours and holidays please check [email or web address] for group call information for [facility] ? ?  ?

## 2021-05-22 NOTE — Progress Notes (Signed)
? ? ? ?  Subjective: ?Patient reassessed this afternoon. Has been weaned off of pressors since this morning. Patient intermittently confused. Undergoing swallow assessment with speech therapy. PT coming by this afternoon. ? ?Objective:  ? ?VITALS:   ?Vitals:  ? 05/22/21 0900 05/22/21 1000 05/22/21 1030 05/22/21 1100  ?BP: (!) 103/56 (!) 105/47 (!) 111/49 (!) 100/52  ?Pulse: 86 88 86 (!) 101  ?Resp: 18 17 (!) 24 20  ?Temp:      ?TempSrc:      ?SpO2: 96% 90% 95% (!) 87%  ?Weight:      ?Height:      ? ? ?Sensation intact distally to the plantar and dorsal aspects of the foot ?Intact pulses distally ?Plantar flexion intact, but unable to demonstrate active ankle dorsiflexion or EHL function this AM ?Incision: prevena dressing holding suction, no output in cannsiter ?Compartment soft ? ? ?Lab Results  ?Component Value Date  ? WBC 20.3 (H) 05/22/2021  ? HGB 7.4 (L) 05/22/2021  ? HCT 22.8 (L) 05/22/2021  ? MCV 86.0 05/22/2021  ? PLT 196 05/22/2021  ? ?BMET ?   ?Component Value Date/Time  ? NA 139 05/22/2021 0522  ? K 3.9 05/22/2021 0522  ? CL 108 05/22/2021 0522  ? CO2 26 05/22/2021 0522  ? GLUCOSE 122 (H) 05/22/2021 0522  ? BUN 37 (H) 05/22/2021 0522  ? CREATININE 1.49 (H) 05/22/2021 0522  ? CALCIUM 8.1 (L) 05/22/2021 0522  ? GFRNONAA 48 (L) 05/22/2021 0522  ? ? ? ? ?Xray: Postop x-rays demonstrate excellent reduction of the periprosthetic fracture good alignment of hardware no adverse features. ? ?Assessment/Plan: ?1 Day Post-Op  ? ?Principal Problem: ?  Right femoral fracture ([place]) ?Active Problems: ?  Depression ?  Chronic obstructive pulmonary disease (HCC) ?  AKI (acute kidney injury) ([place]) ?  Status post lobectomy of lung ?  Dehydration ?  Leukocytosis ?  Mixed hyperlipidemia ?  Chronic kidney disease, stage 3a ([place]) ?  Acute on chronic respiratory failure with hypoxia and hypercapnia (HCC) ?  Respiratory acidosis ? ?S/p revision THA for periprosthetic femur fracture 4/14 ? ?Post op foot drop: AFO ordered, likely  intraop stretch injury, no significant swelling in the buttock area and not having much pain so low concern for nerve laceration.  patient endorses intact sensation to dorsal foot, but given his slightly altered mental status may not be a reliable exam. Will continue to monitor. ? ?Post op recs: ?WB: 20% PWB RLE, posterior hip precautions x6 weeks ?Abx: ancef post op transition to cefadroxil 500BID x7 days upon discharge home  ?PT when able ?Imaging: PACU pelvis/femur Xray ?Dressing: prevena incisional wound vac to removed in clinic 1 week post op discharge or 2 weeks post op ?DVT prophylaxis: lovenox '40mg'$  daily x 4 weeks ?Follow up: 1 week post discharge for a wound check with Dr. Zachery Dakins at [facility].  ?Address: [address] [place], [place], [place] [address]  ?Office Phone: [phone] ? ? ? ?Hakim Minniefield A Wade Sigala ?05/22/2021, 12:28 PM ? ? ?Charlies Constable, MD ? ?Contact information:   ?Weekdays 7am-5pm epic message Dr. Zachery Dakins, or call office for patient follow up: [phone] ?After hours and holidays please check [email or web address] for group call information for [facility] ? ?  ?

## 2021-05-22 NOTE — Evaluation (Signed)
Physical Therapy Evaluation ?Patient Details ?Name: William Kirk ?MRN: [id number] ?DOB: 10/14/1940 ?Today's Date: 05/22/2021 ? ?History of Present Illness ? 81 y.o. male who presents to the emergency department via EMS from home after sustaining a mechanical fall PTA. periprosthetic right femoral fracture Patient tripped while going up the steps. 4/14 ORIF with replacing the femoral component of previous THA; post-op confusion with hypoxia; required vasopressors;   PMH significant of PVD, COPD, hyperlipidemia, CKD 3A  ?Clinical Impression ?  ?Pt admitted secondary to problem above with deficits below. PTA patient was living at home with spouse and walking independently.  Pt currently requires 2 person moderate assist for OOB to stand at EOB. Patient has been restless and requiring restraints, therefore returned to bed in chair position. Patient was able to stand with ~20% PWB RLE however could not advance/step with LLE. Anticipate slower progress and need for SNF. Anticipate patient will benefit from PT to address problems listed below.Will continue to follow acutely to maximize functional mobility independence and safety.   ?   ?   ? ?Recommendations for follow up therapy are one component of a multi-disciplinary discharge planning process, led by the attending physician.  Recommendations may be updated based on patient status, additional functional criteria and insurance authorization. ? ?Follow Up Recommendations Skilled nursing-short term rehab (<3 hours/day) ? ?  ?Assistance Recommended at Discharge Frequent or constant Supervision/Assistance  ?Patient can return home with the following ? Two people to help with walking and/or transfers;Assistance with cooking/housework;Two people to help with bathing/dressing/bathroom;Direct supervision/assist for medications management;Direct supervision/assist for financial management;Assist for transportation;Help with stairs or ramp for entrance ? ?  ?Equipment  Recommendations Rolling walker (2 wheels);Wheelchair (measurements PT);Wheelchair cushion (measurements PT);BSC/3in1  ?Recommendations for Other Services ? OT consult  ?  ?Functional Status Assessment Patient has had a recent decline in their functional status and demonstrates the ability to make significant improvements in function in a reasonable and predictable amount of time.  ? ?  ?Precautions / Restrictions Precautions ?Precautions: Posterior Hip;Fall ?Precaution Booklet Issued: No ?Precaution Comments: pt educated on precautions but required facilitation to avoid those positions during mobility ?Restrictions ?Weight Bearing Restrictions: Yes ?RLE Weight Bearing: Partial weight bearing ?RLE Partial Weight Bearing Percentage or Pounds: 20%  ? ?  ? ?Mobility ? Bed Mobility ?Overal bed mobility: Needs Assistance ?Bed Mobility: Supine to Sit, Sit to Supine ?  ?  ?Supine to sit: Mod assist ?Sit to supine: Min assist, +2 for physical assistance ?  ?General bed mobility comments: 2 person assist to return to bed for upper body and guiding RLE ?  ? ?Transfers ?Overall transfer level: Needs assistance ?Equipment used: Rolling walker (2 wheels) ?Transfers: Sit to/from Stand ?Sit to Stand: Mod assist, +2 physical assistance ?  ?  ?  ?  ?  ?General transfer comment: pt not understanding to push from bed and allowed to start wiht bil hands on RW with it anchored by PT; RLE extended in front of him to reduce risk of excessive WB'g ?  ? ?Ambulation/Gait ?  ?  ?  ?  ?  ?  ?Pre-gait activities: attempted side-stepping with pt pivoting on his Lt foot instead of hopping/stepping. Attempted step backwards with LLE and pt unable to unweight enough (or ?understand how) ?  ? ?Stairs ?  ?  ?  ?  ?  ? ?Wheelchair Mobility ?  ? ?Modified Rankin (Stroke Patients Only) ?  ? ?  ? ?Balance Overall balance assessment: History of Falls, Needs  assistance ?Sitting-balance support: No upper extremity supported, Feet supported ?Sitting  balance-Leahy Scale: Fair ?  ?  ?Standing balance support: Bilateral upper extremity supported, Reliant on assistive device for balance ?Standing balance-Leahy Scale: Poor ?  ?  ?  ?  ?  ?  ?  ?  ?  ?  ?  ?  ?   ? ? ? ?Pertinent Vitals/Pain Pain Assessment ?Pain Assessment: PAINAD ?Breathing: normal ?Negative Vocalization: occasional moan/groan, low speech, negative/disapproving quality ?Facial Expression: smiling or inexpressive ?Body Language: tense, distressed pacing, fidgeting ?Consolability: no need to console ?PAINAD Score: 2  ? ? ?Home Living Family/patient expects to be discharged to:: Unsure ?  ?  ?  ?  ?  ?  ?  ?  ?  ?   ?  ?Prior Function Prior Level of Function : Independent/Modified Independent ?  ?  ?  ?  ?  ?  ?Mobility Comments: walking independently ?  ?  ? ? ?Hand Dominance  ? Dominant Hand: Right ? ?  ?Extremity/Trunk Assessment  ? Upper Extremity Assessment ?Upper Extremity Assessment: Overall WFL for tasks assessed ?  ? ?Lower Extremity Assessment ?Lower Extremity Assessment: RLE deficits/detail ?RLE Deficits / Details: limited by posterior hip precautions but otherwise PROM WFL; no active dorsiflexion; +PF ?RLE Sensation: WNL ?  ? ?Cervical / Trunk Assessment ?Cervical / Trunk Assessment: Kyphotic  ?Communication  ? Communication: No difficulties  ?Cognition Arousal/Alertness: Awake/alert ?Behavior During Therapy: [facility] for tasks assessed/performed ?Overall Cognitive Status: No family/caregiver present to determine baseline cognitive functioning ?  ?  ?  ?  ?  ?  ?  ?  ?  ?  ?  ?  ?  ?  ?  ?  ?General Comments: oriented to self, DOB, reason for hospitalization ?  ?  ? ?  ?General Comments General comments (skin integrity, edema, etc.): on ICU monitor with BP elevating appropriately from supine to sit and after standing seated was slightly lower (mean 76 down to 73) ? ?  ?Exercises    ? ?Assessment/Plan  ?  ?PT Assessment Patient needs continued PT services  ?PT Problem List Decreased range of  motion;Decreased strength;Decreased balance;Decreased activity tolerance;Decreased mobility;Decreased cognition;Decreased knowledge of use of DME;Decreased safety awareness;Decreased knowledge of precautions;Pain ? ?   ?  ?PT Treatment Interventions DME instruction;Gait training;Functional mobility training;Therapeutic activities;Therapeutic exercise;Balance training;Cognitive remediation;Patient/family education   ? ?PT Goals (Current goals can be found in the Care Plan section)  ?Acute Rehab PT Goals ?Patient Stated Goal: agrees he wants to walk again ?PT Goal Formulation: With patient ?Time For Goal Achievement: 06/05/21 ?Potential to Achieve Goals: Good ? ?  ?Frequency Min 3X/week ?  ? ? ?Co-evaluation   ?  ?  ?  ?  ? ? ?  ?AM-PAC PT "6 Clicks" Mobility  ?Outcome Measure Help needed turning from your back to your side while in a flat bed without using bedrails?: A Lot ?Help needed moving from lying on your back to sitting on the side of a flat bed without using bedrails?: A Lot ?Help needed moving to and from a bed to a chair (including a wheelchair)?: Total ?Help needed standing up from a chair using your arms (e.g., wheelchair or bedside chair)?: Total ?Help needed to walk in hospital room?: Total ?Help needed climbing 3-5 steps with a railing? : Total ?6 Click Score: 8 ? ?  ?End of Session Equipment Utilized During Treatment: Oxygen (4L) ?Activity Tolerance: Patient tolerated treatment well ?Patient left: in bed;with call  bell/phone within reach;with bed alarm set;with restraints reapplied (bil mitts) ?Nurse Communication: Mobility status ?PT Visit Diagnosis: Other abnormalities of gait and mobility (R26.89);History of falling (Z91.81) ?  ? ?Time: 0865-7846 ?PT Time Calculation (min) (ACUTE ONLY): 30 min ? ? ?Charges:   PT Evaluation ?$PT Eval Moderate Complexity: 1 Mod ?PT Treatments ?$Therapeutic Activity: 8-22 mins ?  ?   ? ? ? ?Jerolyn Center, PT ?Acute Rehabilitation Services  ?Pager [phone] ?Office  [phone] ? ? ?Scherrie November Tyson Masin ?05/22/2021, 1:53 PM ? ?

## 2021-05-22 NOTE — Consult Note (Signed)
? ?NAME:  William Kirk, MRN:  [id number], DOB:  1940/12/05, LOS: 3 ?ADMISSION DATE:  05/19/2021, CONSULTATION DATE:  4/14 ?REFERRING MD:  Dr. Elvera Lennox, CHIEF COMPLAINT:  Mechanical Fall, Altered Mental Status   ? ?History of Present Illness:  ?81 y/o M who presented to [facility] on 4/12 after suffering a mechanical fall at home.  ? ?The patient lives at home with his wife.  At baseline he is normally able to walk around the yard.  Does not use assistive devices. He was reportedly walking up steps to go into the home & was very short of breath and fell backward onto his right hip.  There was no reported LOC.  He complained of right hip pain and difficulty walking. He had prior right hip surgery at [facility]. Initial imaging showed an oblique fracture along the femoral stem component of the bipolar right hip arthroplasty with slightly increased varus angulation, severe calcified PVD. He was admitted per [facility] and Orthopedics were consulted.  The patient was planned for revision of right hip on 4/14.  Per report, overnight into early 4/14, he had saturations into the 80's.  On arrival to the pre-op area his saturations were in the 60's.  The patient was intubated.  ABG was assessed which showed hypercarbic respiratory failure.  His case lasted approximately 4 hours.  Last fentanyl dosing approximately 11 am.  Post operatively, he was extubated but remained lethargic with ABG consistent with hypercarbic respiratory failure.  Notable labs include sr cr 1.95 (baseline ~ 1.3-1.4), WBC 22.7.   ? ?PCCM consulted for respiratory failure, AMS. ? 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 Dorris Fetch 2020 ?Empyema ?HTN  ?PVD ? ?Significant Hospital  Events: ?Including procedures, antibiotic start and stop dates in addition to other pertinent events   ?4/12 Admit  ?4/14 To OR for revision of right hip, hypercarbic / sleepy post surgery, PCCM consulted with airway concerns ? ?Interim History / Subjective:  ?Awake on NRB this am, mildly confused but following commands ?Required vasopressors overnight and weaned off levophed this am ? ?Objective   ?Blood pressure (!) 86/43, pulse 92, temperature (!) 97.1 ?F (36.2 ?C), temperature source Oral, resp. rate 15, height 5\' 10"  (1.778 m), weight 70.8 kg, SpO2 93 %. ?   ?   ? ?Intake/Output Summary (Last 24 hours) at 05/22/2021 0835 ?Last data filed at 05/22/2021 0600 ?Gross per 24 hour  ?Intake 4145.1 ml  ?Output 1300 ml  ?Net 2845.1 ml  ? ?Filed Weights  ? 05/19/21 1536 05/21/21 1820  ?Weight: 73.6 kg 70.8 kg  ? ?Physical Exam: ?General: Elderly and chronically ill-appearing, no acute distress ?HENT: [place], AT, OP clear, MMM, NRB in place ?Eyes: EOMI, no scleral icterus ?Respiratory: Right lower lobe rhonchi, no crackles or wheezing ?Cardiovascular: RRR, -M/R/G, no JVD ?GI: BS+, soft, nontender ?Extremities:-Edema,-tenderness, R hip wound vac in place ?Neuro: AAO x4, CNII-XII grossly intact ?Skin: Intact, no rashes or bruising ?Psych: Normal mood, normal affect ? ?WBC slightly improving to 20 ?Cr improving' ?Hg decreased from 10 to 7 ?Resolved Hospital Problem list   ?  ? ?Assessment & Plan:  ? ?Septic shock secondary to aspiration pneumonia/pneumonitis - resolved ?CXR with right-sided pneumonia/atelectasis ?--S/p IVF resuscitation ?--Weaned off levophed with  SBP goal >100 ?--Continue Zosyn. De-escalate pending culture data ?--Trend WBC/fever. Improving ? ?Acute Hypercarbic / Hypoxic Respiratory Failure secondary right aspiration pneumonia ?Chronic Hypoxic Respiratory Failure ?COPD w/o Exacerbation, Hx Lobectomy on R, Hx Bronchiectasis  ?Previously wore O2 after lobectomy in 2020. Not on home O2 currently. [facility]  [facility]   ?-Transition from NRB to [place]. SpO2 goal >88% ?-pulmonary hygiene: Scheduled duonebs.  ?-Restart home Incruse and Dulera ?-HOB elevated  ?-Aspiration precautions ? ?Right Periprosthetic Hip Fracture  ?OR 4/14 with removal and revision of loose femoral implant, post mechanical fall  ?-ortho rec's 20% partial weight bearing on RLE, wound VAC on discharge and prophylaxis with lovenox 40 mg QDx4 weeks ?-post operative care per Ortho.  ?-Lovenox x 4 weeks ?-Posterior hip precautions ?-Tylenol for pain ?-If planning to de-escalate antibiotics:  1 week of abx (cefadroxil 500 mg BID 7days) ? ?Acute Metabolic Encephalopathy - improving ?In setting of hypercarbia, post anesthesia  ?-minimize sedating medications  ?-frequent reorientation  ? ?AKI on CKD IIIa - improving ?-IVF at 77ml/hr  ?-Trend BMP / urinary output ?-Replace electrolytes as indicated ?-Avoid nephrotoxic agents, ensure adequate renal perfusion ? ?Hx Dysphagia  ?-will need SLP evaluation once more alert  ? ?Post-op anemia  ?-Trend CBC ?-transfuse for Hgb <7% or active bleeding  ? ?PVD  ?-hold plavix 4/14 ?-continue ASA   ? ?HLD  ?-continue statin  ? ?Depression / Anxiety  ?-continue zoloft when able to take PO's  ? ?At Risk Malnutrition (POA) ?NPO status, decreased muscle mass ?-nutrition when safe for PO intake  ? ?Best Practice (right click and "Reselect all SmartList Selections" daily)  ?Diet/type: NPO ?DVT prophylaxis: LMWH ?GI prophylaxis: N/A ?Lines: N/A, Arterial Line, and No longer needed.  Order written to d/c  ?Foley:  N/A ?Code Status:  full code ?Last date of multidisciplinary goals of care discussion:  ? ?Labs   ?CBC: ?Recent Labs  ?Lab 05/19/21 ?1645 05/20/21 ?0428 05/21/21 ?0303 05/21/21 ?1324 05/21/21 ?1331 05/22/21 ?0522  ?WBC 14.6* 12.2* 22.7*  --   --  20.3*  ?NEUTROABS 12.0*  --   --   --   --   --   ?HGB 10.4* 10.2* 10.7* 10.2* 8.8* 7.4*  ?HCT 34.5* 32.8* 34.7* 30.0* 26.0* 22.8*  ?MCV 92.2 88.4 88.5  --   --   86.0  ?PLT 207 227 236  --   --  196  ? ? ?Basic Metabolic Panel: ?Recent Labs  ?Lab 05/19/21 ?1645 05/20/21 ?0428 05/21/21 ?0303 05/21/21 ?4010 05/21/21 ?1331 05/22/21 ?0522  ?NA 137 136 136 138 139 139  ?K 4.6 4.9 4.2 4.6 4.7 3.9  ?CL 107 104 104  --   --  108  ?CO2 21* 25 19*  --   --  26  ?GLUCOSE 89 85 78  --   --  122*  ?BUN 25* 27* 36*  --   --  37*  ?CREATININE 1.40* 1.47* 1.95*  --   --  1.49*  ?CALCIUM 8.2* 8.7* 8.6*  --   --  8.1*  ?MG  --  2.2  --   --   --   --   ?PHOS  --  4.1  --   --   --   --   ? ?GFR: ?Estimated Creatinine Clearance: 40.9 mL/min (A) (by C-G formula based on SCr of 1.49 mg/dL (H)). ?Recent Labs  ?Lab 05/19/21 ?1645 05/20/21 ?0428 05/21/21 ?0303 05/22/21 ?0522  ?WBC 14.6* 12.2* 22.7* 20.3*  ? ? ?  Liver Function Tests: ?Recent Labs  ?Lab 05/20/21 ?0428 05/21/21 ?0303 05/22/21 ?0522  ?AST 32 45* 89*  ?ALT 19 22 29   ?ALKPHOS 110 106 68  ?BILITOT 1.0 1.5* 0.8  ?PROT 6.7 6.6 5.2*  ?ALBUMIN 3.4* 3.0* 2.3*  ? ?No results for input(s): LIPASE, AMYLASE in the last 168 hours. ?No results for input(s): AMMONIA in the last 168 hours. ? ?ABG ?   ?Component Value Date/Time  ? PHART 7.260 (L) 05/21/2021 1331  ? PCO2ART 54.4 (H) 05/21/2021 1331  ? PO2ART 72 (L) 05/21/2021 1331  ? HCO3 24.6 05/21/2021 1331  ? TCO2 26 05/21/2021 1331  ? ACIDBASEDEF 3.0 (H) 05/21/2021 1331  ? O2SAT 92 05/21/2021 1331  ?  ? ?Coagulation Profile: ?Recent Labs  ?Lab 05/19/21 ?1645  ?INR 1.0  ? ? ?Cardiac Enzymes: ?No results for input(s): CKTOTAL, CKMB, CKMBINDEX, TROPONINI in the last 168 hours. ? ?HbA1C: ?No results found for: HGBA1C ? ?CBG: ?Recent Labs  ?Lab 05/21/21 ?1931 05/21/21 ?2323 05/22/21 ?0323  ?GLUCAP 114* 133* 127*  ? ? ?Review of Systems:   ?Unable to complete as patient is altered post surgery.  ? ?Past Medical History:  ?He,  has a past medical history of Anxiety, Arthritis, Bleeding stomach ulcer (2000s), COPD (chronic obstructive pulmonary disease) (HCC), Cough, Depression, Dyspnea, Hypertension,  Pneumonia, and PVD (peripheral vascular disease) (HCC) (2014).  ? ?Surgical History:  ? ?Past Surgical History:  ?Procedure Laterality Date  ? ABDOMINAL AORTOGRAM W/LOWER EXTREMITY Bilateral 03/05/2021  ? Procedure: ABDOMINAL AO

## 2021-05-23 ENCOUNTER — Inpatient Hospital Stay (HOSPITAL_COMMUNITY): Payer: No Typology Code available for payment source

## 2021-05-23 DIAGNOSIS — N179 Acute kidney failure, unspecified: Secondary | ICD-10-CM | POA: Diagnosis not present

## 2021-05-23 DIAGNOSIS — J9621 Acute and chronic respiratory failure with hypoxia: Secondary | ICD-10-CM | POA: Diagnosis not present

## 2021-05-23 DIAGNOSIS — S72001A Fracture of unspecified part of neck of right femur, initial encounter for closed fracture: Secondary | ICD-10-CM

## 2021-05-23 DIAGNOSIS — J438 Other emphysema: Secondary | ICD-10-CM

## 2021-05-23 DIAGNOSIS — N1831 Chronic kidney disease, stage 3a: Secondary | ICD-10-CM | POA: Diagnosis not present

## 2021-05-23 LAB — TYPE AND SCREEN
ABO/RH(D): O POS
Antibody Screen: NEGATIVE
Unit division: 0
Unit division: 0

## 2021-05-23 LAB — CBC
HCT: 21.7 % — ABNORMAL LOW (ref 39.0–52.0)
Hemoglobin: 7.1 g/dL — ABNORMAL LOW (ref 13.0–17.0)
MCH: 28 pg (ref 26.0–34.0)
MCHC: 32.7 g/dL (ref 30.0–36.0)
MCV: 85.4 fL (ref 80.0–100.0)
Platelets: 155 10*3/uL (ref 150–400)
RBC: 2.54 MIL/uL — ABNORMAL LOW (ref 4.22–5.81)
RDW: 16 % — ABNORMAL HIGH (ref 11.5–15.5)
WBC: 10.6 10*3/uL — ABNORMAL HIGH (ref 4.0–10.5)
nRBC: 0 % (ref 0.0–0.2)

## 2021-05-23 LAB — GLUCOSE, CAPILLARY
Glucose-Capillary: 96 mg/dL (ref 70–99)
Glucose-Capillary: 95 mg/dL (ref 70–99)
Glucose-Capillary: 78 mg/dL (ref 70–99)
Glucose-Capillary: 96 mg/dL (ref 70–99)
Glucose-Capillary: 84 mg/dL (ref 70–99)

## 2021-05-23 LAB — BASIC METABOLIC PANEL
Anion gap: 6 (ref 5–15)
BUN: 33 mg/dL — ABNORMAL HIGH (ref 8–23)
CO2: 26 mmol/L (ref 22–32)
Calcium: 8.1 mg/dL — ABNORMAL LOW (ref 8.9–10.3)
Chloride: 108 mmol/L (ref 98–111)
Creatinine, Ser: 1.19 mg/dL (ref 0.61–1.24)
GFR, Estimated: >60 mL/min (ref >60)
Glucose, Bld: 97 mg/dL (ref 70–99)
Potassium: 3.5 mmol/L (ref 3.5–5.1)
Sodium: 140 mmol/L (ref 135–145)

## 2021-05-23 LAB — BPAM RBC
Blood Product Expiration Date: 202305092359
Blood Product Expiration Date: 202305112359
ISSUE DATE / TIME: 202304152023
Unit Type and Rh: 5100
Unit Type and Rh: 5100

## 2021-05-23 MED ORDER — POTASSIUM CHLORIDE CRYS ER 20 MEQ PO TBCR
40.0000 meq | EXTENDED_RELEASE_TABLET | Freq: Once | ORAL | Status: AC
  Administered 2021-05-23: 40 meq via ORAL
  Filled 2021-05-23: qty 2

## 2021-05-23 MED ORDER — DIPHENHYDRAMINE HCL 50 MG/ML IJ SOLN
25.0000 mg | Freq: Once | INTRAMUSCULAR | Status: AC
  Administered 2021-05-23: 25 mg via INTRAVENOUS
  Filled 2021-05-23: qty 1

## 2021-05-23 MED ORDER — IPRATROPIUM-ALBUTEROL 0.5-2.5 (3) MG/3ML IN SOLN
3.0000 mL | Freq: Four times a day (QID) | RESPIRATORY_TRACT | Status: DC | PRN
  Administered 2021-05-31: 3 mL via RESPIRATORY_TRACT
  Filled 2021-05-23: qty 3

## 2021-05-23 MED ORDER — HYDROXYZINE HCL 10 MG PO TABS
10.0000 mg | ORAL_TABLET | Freq: Three times a day (TID) | ORAL | Status: DC | PRN
  Administered 2021-05-26 – 2021-06-02 (×8): 10 mg via ORAL
  Filled 2021-05-23 (×14): qty 1

## 2021-05-23 NOTE — TOC Initial Note (Addendum)
Transition of Care (TOC) - Initial/Assessment Note  ? ? ?Patient Details  ?Name: William Kirk ?MRN: [id number] ?Date of Birth: 17-Jun-1940 ? ?Transition of Care [facility]) CM/SW Contact:    ?Lorri Frederick, LCSW ?Phone Number: ?05/23/2021, 12:26 PM ? ?Clinical Narrative:      Pt agitated, oriented x 2, CSW spoke with wife [name] by phone, discussed SNF recommendation. [name] in agreement, permission given to send out referral in hub, [name] requesting [facility] as first choice.  Choice document left in pt room for wife.  Pt lives at home with wife, no current services.  Pt is vaccinated for covid with 4 boosters, per Epic.    ? ?Referral sent out in hub for SNF.        ? ? ?Expected Discharge Plan: Skilled Nursing Facility ?Barriers to Discharge: Continued Medical Work up, SNF Pending bed offer ? ? ?Patient Goals and CMS Choice ?  ?CMS Medicare.gov Compare Post Acute Care list provided to:: Patient Represenative (must comment) ?Choice offered to / list presented to : Spouse ? ?Expected Discharge Plan and Services ?Expected Discharge Plan: Skilled Nursing Facility ?In-house Referral: Clinical Social Work ?  ?Post Acute Care Choice: Skilled Nursing Facility ?Living arrangements for the past 2 months: Single Family Home ?                ?  ?  ?  ?  ?  ?  ?  ?  ?  ?  ? ?Prior Living Arrangements/Services ?Living arrangements for the past 2 months: Single Family Home ?Lives with:: Spouse ?Patient language and need for interpreter reviewed:: Yes ?       ?Need for Family Participation in Patient Care: Yes (Comment) ?Care giver support system in place?: Yes (comment) ?Current home services: Other (comment) (none) ?Criminal Activity/Legal Involvement Pertinent to Current Situation/Hospitalization: No - Comment as needed ? ?Activities of Daily Living ?Home Assistive Devices/Equipment: None ?ADL Screening (condition at time of admission) ?Patient's cognitive ability adequate to safely complete daily activities?: No ?Is the patient deaf  or have difficulty hearing?: Yes ?Does the patient have difficulty seeing, even when wearing glasses/contacts?: No ?Does the patient have difficulty concentrating, remembering, or making decisions?: No ?Patient able to express need for assistance with ADLs?: Yes ?Does the patient have difficulty dressing or bathing?: Yes ?Independently performs ADLs?: No ?Does the patient have difficulty walking or climbing stairs?: Yes ?Weakness of Legs: Right ?Weakness of Arms/Hands: None ? ?Permission Sought/Granted ?  ?  ?   ?   ?   ?   ? ?Emotional Assessment ?Appearance:: Appears older than stated age ?Attitude/Demeanor/Rapport: Other (comment) (agitated) ?Affect (typically observed): Anxious ?Orientation: : Oriented to Self, Oriented to Place ?Alcohol / Substance Use: Not Applicable ?Psych Involvement: No (comment) ? ?Admission diagnosis:  Right femoral fracture (HCC) [S72.91XA] ?Periprosthetic fracture around internal prosthetic right hip joint, initial encounter (HCC) P3213405.01XA] ?Patient Active Problem List  ? Diagnosis Date Noted  ? Acute on chronic respiratory failure with hypoxia and hypercapnia (HCC) 05/21/2021  ? Respiratory acidosis 05/21/2021  ? Dehydration 05/20/2021  ? Leukocytosis 05/20/2021  ? 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 Chronic kidney disease, stage 3a (HCC) 05/20/2021  ? Right femoral fracture (HCC) 05/19/2021  ? Asymptomatic carotid artery stenosis without infarction, right 02/24/2021  ? Abnormality of gait 04/16/2020  ? Muscle cramp 04/16/2020  ? 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 Empyema, right (HCC) 11/09/2018  ? Empyema (HCC) 11/06/2018  ? Pressure  injury of skin 11/06/2018  ? 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 Hemoptysis 07/07/2018  ? AKI (acute kidney injury) (HCC) 07/07/2018  ? Acute respiratory failure (HCC) 07/07/2018  ? Chronic obstructive pulmonary disease (HCC) 11/07/2016  ? Chronic respiratory failure with hypoxia  (HCC) 11/07/2016  ? Pulmonary infiltrates 11/07/2016  ? Swallowing dysfunction 09/26/2016  ? Depression 09/21/2016  ? Aspiration pneumonia (HCC) 09/21/2016  ? HTN (hypertension) 09/21/2016  ? HCAP (healthcare-associated pneumonia)   ? COPD with acute exacerbation (HCC) 06/21/2016  ? Acute respiratory failure with hypoxia (HCC) 06/21/2016  ? Hypokalemia 06/21/2016  ? Hyponatremia 06/21/2016  ? Thrombocytosis 06/21/2016  ? PVD (peripheral vascular disease) (HCC) 06/21/2016  ? Bronchiectasis (HCC) 06/21/2016  ? Protein-calorie malnutrition, severe (HCC) 06/21/2016  ? Multifocal pneumonia 06/20/2016  ? FUO (fever of unknown origin) 04/15/2015  ? 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 Other tear of medial meniscus, current injury, unspecified knee, initial encounter 12/03/2012  ? ?PCP:  Floreen Comber, PA-C ?Pharmacy:   ?[facility] - [place], [place] - [address] ?[phone] ?[place] [address] ?Phone: [phone] Fax: [phone] ? ? ? ? ?Social Determinants of Health (SDOH) Interventions ?  ? ?Readmission Risk Interventions ? ?  02/25/2021  ?  2:25 PM 11/08/2018  ? 11:19 AM 10/23/2018  ?  8:44 AM  ?Readmission Risk Prevention Plan  ?Post Dischage Appt Complete    ?Medication Screening Complete    ?Transportation Screening Complete Complete Complete  ?PCP or Specialist Appt within 3-5 Days  Complete Complete  ?HRI or Home Care Consult  Complete Complete  ?Social Work Consult for Recovery Care Planning/Counseling  Complete Complete  ?Palliative Care Screening  Not Applicable Not Applicable  ?Medication Review Oceanographer)  Complete Complete  ? ? ? ?

## 2021-05-23 NOTE — [place] FL2 (Signed)
?[place] MEDICAID FL2 LEVEL OF CARE SCREENING TOOL  ?  ? ?IDENTIFICATION  ?Patient Name: ?William Kirk Birthdate: [birth year] Sex: male Admission Date (Current Location): ?05/19/2021  ?[place] Number: ? [place] ?  Facility and Address:  ?[facility], [address] [place], [place] [address] ?     Provider Number: ?[id number]  ?Attending Physician Name and Address:  ?Albertine Grates, MD ? Relative Name and Phone Number:  ?Stammen,[name] (Spouse)   [phone] ?   ?Current Level of Care: ?Hospital Recommended Level of Care: ?Skilled Nursing Facility Prior Approval Number: ?  ? ?Date Approved/Denied: ?  PASRR Number: ?Pending ? ?Discharge Plan: ?SNF ?  ? ?Current Diagnoses: ?Patient Active Problem List  ? Diagnosis Date Noted  ? Acute on chronic respiratory failure with hypoxia and hypercapnia (HCC) 05/21/2021  ? Respiratory acidosis 05/21/2021  ? Dehydration 05/20/2021  ? Leukocytosis 05/20/2021  ? 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 Chronic kidney disease, stage 3a (HCC) 05/20/2021  ? Right femoral fracture (HCC) 05/19/2021  ? Asymptomatic carotid artery stenosis without infarction, right 02/24/2021  ? Abnormality of gait 04/16/2020  ? Muscle cramp 04/16/2020  ? 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 Empyema, right (HCC) 11/09/2018  ? Empyema (HCC) 11/06/2018  ? Pressure injury of skin 11/06/2018  ? 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 Hemoptysis 07/07/2018  ? AKI (acute kidney injury) (HCC) 07/07/2018  ? Acute respiratory failure (HCC) 07/07/2018  ? Chronic obstructive pulmonary disease (HCC) 11/07/2016  ? Chronic respiratory failure with hypoxia (HCC) 11/07/2016  ? Pulmonary infiltrates 11/07/2016  ? Swallowing dysfunction 09/26/2016  ? Depression 09/21/2016  ? Aspiration pneumonia (HCC) 09/21/2016  ? HTN (hypertension) 09/21/2016  ? HCAP (healthcare-associated pneumonia)   ? COPD with acute exacerbation (HCC)  06/21/2016  ? Acute respiratory failure with hypoxia (HCC) 06/21/2016  ? Hypokalemia 06/21/2016  ? Hyponatremia 06/21/2016  ? Thrombocytosis 06/21/2016  ? PVD (peripheral vascular disease) (HCC) 06/21/2016  ? Bronchiectasis (HCC) 06/21/2016  ? Protein-calorie malnutrition, severe (HCC) 06/21/2016  ? Multifocal pneumonia 06/20/2016  ? FUO (fever of unknown origin) 04/15/2015  ? 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 Other tear of medial meniscus, current injury, unspecified knee, initial encounter 12/03/2012  ? ? ?Orientation RESPIRATION BLADDER Height & Weight   ?  ?Place, Self ? O2 Incontinent, External catheter Weight: 156 lb 1.4 oz (70.8 kg) ?Height:  5\' 10"  (177.8 cm)  ?BEHAVIORAL SYMPTOMS/MOOD NEUROLOGICAL BOWEL NUTRITION STATUS  ?    Continent Diet  ?AMBULATORY STATUS COMMUNICATION OF NEEDS Skin   ?Extensive Assist   Normal ?  ?  ?  ?    ?     ?     ? ? ?Personal Care Assistance Level of Assistance  ?Bathing, Feeding, Dressing Bathing Assistance: Maximum assistance ?Feeding assistance: Independent ?Dressing Assistance: Maximum assistance ?   ? ?Functional Limitations Info  ?Sight, Speech, Hearing Sight Info: Adequate ?Hearing Info: Adequate ?Speech Info: Adequate  ? ? ?SPECIAL CARE FACTORS FREQUENCY  ?PT (By licensed PT), OT (By licensed OT)   ?  ?PT Frequency: 5x a week ?OT Frequency: 5x a week ?  ?  ?  ?   ? ? ?Contractures Contractures Info: Not present  ? ? ?Additional Factors Info  ?Code Status, Allergies Code Status Info: Full ?Allergies Info: Testosterone ?  ?  ?  ?   ? ?  Current Medications (05/23/2021):  This is the current hospital active medication list ?Current Facility-Administered Medications  ?Medication Dose Route Frequency Provider Last Rate Last Admin  ? 0.9 %  sodium chloride infusion (Manually program via Guardrails IV Fluids)   Intravenous Once Luciano Cutter, MD      ? 0.9 %  sodium chloride  infusion  250 mL Intravenous Continuous Ranee Gosselin, MD      ? acetaminophen (TYLENOL) tablet 650 mg  650 mg Oral Q6H PRN [name], MD      ? Or  ? acetaminophen (TYLENOL) suppository 650 mg  650 mg Rectal Q6H PRN [name], MD      ? aspirin chewable tablet 81 mg  81 mg Oral Daily Luciano Cutter, MD   81 mg at 05/23/21 1245  ? atorvastatin (LIPITOR) tablet 80 mg  80 mg Oral QHS [name], MD   80 mg at 05/22/21 2114  ? chlorhexidine (PERIDEX) 0.12 % solution 15 mL  15 mL Mouth Rinse BID Karie Fetch P, DO   15 mL at 05/23/21 1245  ? Chlorhexidine Gluconate Cloth 2 % PADS 6 each  6 each Topical Daily [name], MD   6 each at 05/21/21 0546  ? Chlorhexidine Gluconate Cloth 2 % PADS 6 each  6 each Topical Q0600 Steffanie Dunn, DO   6 each at 05/21/21 2000  ? dextrose 5 % in lactated ringers infusion   Intravenous Continuous Luciano Cutter, MD   Stopped at 05/23/21 [phone]  ? enoxaparin (LOVENOX) injection 40 mg  40 mg Subcutaneous Q24H [name], MD   40 mg at 05/22/21 9604  ? hydrALAZINE (APRESOLINE) injection 10 mg  10 mg Intravenous Q6H PRN Karie Fetch P, DO   10 mg at 05/21/21 1830  ? hydrOXYzine (ATARAX) tablet 10 mg  10 mg Oral TID PRN Albertine Grates, MD      ? ipratropium-albuterol (DUONEB) 0.5-2.5 (3) MG/3ML nebulizer solution 3 mL  3 mL Nebulization Q6H PRN Albertine Grates, MD      ? MEDLINE mouth rinse  15 mL Mouth Rinse q12n4p Karie Fetch P, DO   15 mL at 05/23/21 1247  ? metoprolol tartrate (LOPRESSOR) tablet 12.5 mg  12.5 mg Oral Daily [name], MD   12.5 mg at 05/23/21 1245  ? mometasone-formoterol (DULERA) 200-5 MCG/ACT inhaler 2 puff  2 puff Inhalation BID [name], MD   2 puff at 05/23/21 5409  ? piperacillin-tazobactam (ZOSYN) IVPB 3.375 g  3.375 g Intravenous Q8H Titus Mould, RPH 12.5 mL/hr at 05/23/21 0745 Infusion Verify at 05/23/21 0745  ? sertraline (ZOLOFT) tablet 200 mg  200 mg Oral Daily Luciano Cutter, MD   200  mg at 05/23/21 1245  ? umeclidinium bromide (INCRUSE ELLIPTA) 62.5 MCG/ACT 1 puff  1 puff Inhalation q AM Luciano Cutter, MD   1 puff at 05/23/21 0729  ? ? ? ?Discharge Medications: ?Please see discharge summary for a list of discharge medications. ? ?Relevant Imaging Results: ? ?Relevant Lab Results: ? ? ?Additional Information ?SSN: [id number]; Moderna COVID-19 Vaccine 05/25/2020 , 11/05/2019 , 04/08/2019 , 02/27/2019  Moderna Covid-19 Booster Vaccine 05/25/2020, Moderna Covid-19 vaccine Bivalent Booster 11/09/2020 ? ?Kristell Wooding Wynn Banker, LCSWA ? ? ? ? ?

## 2021-05-23 NOTE — Progress Notes (Signed)
Modified Barium Swallow Progress Note ? ?Patient Details  ?Name: DRAPER GALLON ?MRN: [id number] ?Date of Birth: 17-Oct-1940 ? ?Today's Date: 05/23/2021 ? ?Modified Barium Swallow completed.  Full report located under Chart Review in the Imaging Section. ? ?Brief recommendations include the following: ? ?Clinical Impression ? Pt presents with a severe-profound pharyngeal dysphagia. Dysphagia is chronic in nature, but does appear to be exacerbated by present weakness from recent mechanical fall resulting in this hospitalization. He has history of PEG in 2022, but unsure if it is still in place. Current CXR reveals PNA. Oral phase was grossly WNL despite some mildly slowed mastication d/t dentures. Pharyngeal phase is c/b severely reduced strength/ROM across all structures: reduced base of tongue retraction, hyolaryngeal excursion, epiglottic inversion, laryngeal vestibule closure, pharyngeal constriction, UES opening. These deficits resulted in gross aspiration before the swallow with thin and nectar thick liquids and gross aspiration after the swallow (from residue) with honey thick liquids, purees, and solids. He had intermittent sensation to aspiration with strong cough response. Cough itself was intermittently effective in clearing airway. Thicker consistencies resulted in greater pharyngeal residue (vallecular and pyriform sinus) as well as retained residue in laryngeal vestibule. Swallow strategies trialed but ineffective include: 5 mL bolus restraint, chin tuck, oral prep hold, swallow/cough/swallow. Pt is not presently cognitively able to complete strategies. At this time, pt is at extremely high risk for ongoing aspiration and subsequent PNA, given current CXR. He is recommended to continue NPO with use of PEG if still in place (unable to find in chart). Should he/wife opt for liberal diet/comfort feedings, there was no benefit to thickening liquids, as they aspirated in equal or greater amounts and are harder  for body to clear from lungs.  ? ?Diligent oral care is imperative. May have ice chips after oral care for comfort. ? ?SLP service to trial therapy if pt is cognitively able to participate. ?  ?Swallow Evaluation Recommendations ? ?   ? ? SLP Diet Recommendations: NPO ? ?   ? ? Medication Administration: Via alternative means ? ?  ? ? Oral Care Recommendations: Oral care QID ? ?   ? ?Dolores Mcgovern P. Dreshawn Hendershott, M.S., CCC-SLP ?Speech-Language Pathologist ?Acute Rehabilitation Services ?Pager: [phone] ? ? ?[place] ?05/23/2021,2:49 PM ?

## 2021-05-23 NOTE — Social Work (Signed)
RE: William Kirk ?Date of Birth: 1940/12/17 ?Date: 05/23/2021 ? ?Please be advised that the above-named patient will require a short-term nursing home stay - anticipated 30 days or less for rehabilitation and strengthening.  The plan is for return home.  ?

## 2021-05-23 NOTE — Progress Notes (Addendum)
?PROGRESS NOTE ? ? ? ?William Kirk  ZOX:[id number] DOB: 03-04-1940 DOA: 05/19/2021 ?PCP: Floreen Comber, PA-C  ? ? ? ?Brief Narrative:  ? ?H/o PAD, COPD, bronchiectasis , dysphagia, right lower lobe lobectomy in 2020  due to nonfunctional RLL from multiple aspiration pneumonia,   CKD 3A who presents to the  ED on 4/12 after tripped while going up the steps at home, found to have  right periprosthetic hip fracture, s/p ORIF on 4/14. ? ?Preoperatively he had desaturations into the 80s on the floor, and was found to be in the 60s on preop.  He was intubated preoperatively due to hypoxia.  He had a prolonged surgery about 4hrs but was able to be extubated in the PACU.  He had prolonged time to wake up.  ABG with mild acute hypercapnia.  he was transferred to icu post op on 4/14 due to  altered mental status, hypoxia, hypercapnia, he Required vasopressors briefly , weaned off levophed on 4/15 am, he also required NRB while in the ICU, now on [facility], he is  transferred to floor on 4/16 ? ?Subjective: ? ? ?He is fully awake but Remains very confused, not oriented to place or time, he is currently in soft restrain to prevent self injury ?He has Intermittent congested cough, currently on 3liters ?No wheezing, good aeration on exam ?No edema ?No fever ? ?He has a npo order and swallow eval  placed since yesterday, currently awaiting for swallow eval ? ?He received prbcx1 last night, hgb this am is 7, hold lovenox ,repeat cbc in am, does not appear to have overt bleeding  ? ?Assessment & Plan: ? Principal Problem: ?  Right femoral fracture (HCC) ?Active Problems: ?  Depression ?  Chronic obstructive pulmonary disease (HCC) ?  AKI (acute kidney injury) (HCC) ?  Status post lobectomy of lung ?  Dehydration ?  Leukocytosis ?  Mixed hyperlipidemia ?  Chronic kidney disease, stage 3a (HCC) ?  Acute on chronic respiratory failure with hypoxia and hypercapnia (HCC) ?  Respiratory acidosis ? ?Right Periprosthetic Hip Fracture ,  reports mechanical fall  ?-S/p revision THA for periprosthetic femur fracture 4/14 ? Post op foot drop: AFO  ?-post operative care per Ortho, details please see ortho note on 4/16 ? ? ? ?Septic shock /secondary to aspiration pneumonia resolved ?Was on pressors briefly in the ICU , now off pressors ,blood pressure stable ? ?  ?Acute Hypercarbic / Hypoxic Respiratory Failure secondary  recurrent right aspiration pneumonia/dysphagia ?-wife reports he was just getting over an episode of aspiration PNA a week ago ?-CXR with right-sided pneumonia/atelectasis ?-Intubated preoperatively, was able to extubate postop, briefly required NRB, now on [place] 3 L ?--HOB elevated  ?-Aspiration precautions ?-speech eval, currently NPO,  ?-on ivf/zosyn ? ?COPD w/o Exacerbation, Hx Lobectomy on RLL, Hx Bronchiectasis  ?Previously wore O2 after lobectomy in 2020. Not on home O2 in the last two year   ?--pulmonary hygiene: Scheduled duonebs.  ?-Restart home Incruse and Dulera ?  ?  ?Acute Metabolic Encephalopathy - improving ?Likely multifactorial, in setting of hypercarbia, post anesthesia , wife also report patient has underlying short term memory issues for several years ?-minimize sedating medications  ?-frequent reorientation  ?-Safety sitter ? ? Post-op anemia  ?-Trend CBCx1 ?-transfuse for Hgb <7% or active bleeding  ?-hold lovenox and plavix today, resume if hgb stable ?  ?AKI on CKD IIIa - improving ?--Avoid nephrotoxic agents, ensure adequate renal perfusion ?  ?  ?PVD  ?PAD with intermittent  claudication on the left LE and asymptomatic carotid stenosis  ?S/p RIGHT TRANSCAROTID ARTERY REVASCULARIZATION 02/2021 ?-hold plavix 4/14, resume plavix once hgb is stable ?-continue ASA  /statin ?  ?HLD  ?-continue statin  ? 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 ?  ?Depression / Anxiety  ?-continue zoloft when able to take PO's  ?  ? ? ? ?Skin Assessment: ?I have examined the patient?s skin and I agree with the wound  assessment as performed by the wound care RN as outlined below: ? ?  ?  ?  ?  ?  ?  ?  ?   ?Pressure Injury Heel Posterior;Right Stage 1 -  Intact skin with non-blanchable redness of a localized area usually over a bony prominence. (Active)  ?   ?Location: Heel  ?Location Orientation: Posterior;Right  ?Staging: Stage 1 -  Intact skin with non-blanchable redness of a localized area usually over a bony prominence.  ?Wound Description (Comments):   ?Present on Admission: Yes  ?Dressing Type None 05/23/21 0745  ?   ?Pressure Injury 05/22/21 Toe (Comment  which one) Anterior;Left scabed over (Active)  ?05/22/21 0700  ?Location: Toe (Comment  which one) (5th toe)  ?Location Orientation: Anterior;Left  ?Staging:   ?Wound Description (Comments): scabed over  ?Present on Admission:   ? ? ? ?I have Reviewed nursing notes, Vitals, pain scores, I/o's, Lab results and  imaging results since pt's last encounter, details please see discussion above  ?I ordered the following labs:  ?Unresulted Labs (From admission, onward)  ? ?  Start     Ordered  ? 05/24/21 0500  CBC  Tomorrow morning,   R       ?Question:  Specimen collection method  Answer:  Lab=Lab collect  ? 05/23/21 1311  ? 05/24/21 0500  Comprehensive metabolic panel  Tomorrow morning,   R       ?Question:  Specimen collection method  Answer:  Lab=Lab collect  ? 05/23/21 1311  ? 05/21/21 1649  Expectorated Sputum Assessment w Gram Stain, Rflx to Resp Cult  Once,   R       ? 05/21/21 1648  ? 05/21/21 1320  Blood gas, arterial  Once,   R       ? 05/21/21 1319  ? Unscheduled  Occult blood card to lab, stool  As needed,   R     ? 05/23/21 1248  ? ?  ?  ? ?  ? ? ? ?DVT prophylaxis: enoxaparin (LOVENOX) injection 40 mg Start: 05/22/21 1000 ?SCDs Start: 05/20/21 0011 ? ? ?Code Status:   Code Status: Full Code ? ?Family Communication: wife over the phone ?Disposition:  ? ?Status is: Inpatient ? ?Dispo: The patient is from: home ?             Anticipated d/c is to: SNF ?              Anticipated d/c date is: > 24hrs ? ?Antimicrobials:   ? ?Anti-infectives (From admission, onward)  ? ? Start     Dose/Rate Route Frequency Ordered Stop  ? 05/22/21 0600  ceFAZolin (ANCEF) IVPB 2g/100 mL premix  Status:  Discontinued       ? 2 g ?200 mL/hr over 30 Minutes Intravenous On call to O.R. 05/21/21 1517 05/21/21 1552  ? 05/21/21 2200  piperacillin-tazobactam (ZOSYN) IVPB 3.375 g       ? 3.375 g ?12.5 mL/hr over 240 Minutes Intravenous Every 8 hours 05/21/21 1731    ?  05/21/21 1745  piperacillin-tazobactam (ZOSYN) IVPB 3.375 g       ? 3.375 g ?100 mL/hr over 30 Minutes Intravenous STAT 05/21/21 1731 05/21/21 1925  ? 05/21/21 1530  ceFAZolin (ANCEF) IVPB 2g/100 mL premix  Status:  Discontinued       ? 2 g ?200 mL/hr over 30 Minutes Intravenous Every 8 hours 05/21/21 1518 05/21/21 1552  ? 05/21/21 1138  vancomycin (VANCOCIN) powder  Status:  Discontinued       ?   As needed 05/21/21 1138 05/21/21 1252  ? ?  ? ? ? ? ? ?Objective: ?Vitals:  ? 05/23/21 0745 05/23/21 0940 05/23/21 1658 05/23/21 2000  ?BP:  140/60 137/61 124/62  ?Pulse: 93 95 98 99  ?Resp: 20 (!) 25 20 17   ?Temp: 97.7 ?F (36.5 ?C) 98.4 ?F (36.9 ?C) 98.8 ?F (37.1 ?C) 98.2 ?F (36.8 ?C)  ?TempSrc: Oral Oral Oral Oral  ?SpO2: 94% 96% 94% 97%  ?Weight:      ?Height:      ? ? ?Intake/Output Summary (Last 24 hours) at 05/23/2021 2230 ?Last data filed at 05/23/2021 0934 ?Gross per 24 hour  ?Intake 643.84 ml  ?Output 260 ml  ?Net 383.84 ml  ? ?Filed Weights  ? 05/19/21 1536 05/21/21 1820  ?Weight: 73.6 kg 70.8 kg  ? ? ?Examination: ? ?General exam: alert, awake, communicative, very confused, only oriented to person ?Respiratory system: Clear to auscultation. Respiratory effort normal. ?Cardiovascular system:  RRR.  ?Gastrointestinal system: Abdomen is nondistended, soft and nontender.  Normal bowel sounds heard. ?Central nervous system: Alert and oriented to person only ?Extremities:  no edema, right hip post op changes ?Skin: as above ?Psychiatry: confused   ? ? ? ?Data Reviewed: I have personally reviewed  labs and visualized  imaging studies since the last encounter and formulate the plan  ? ? ? ? ? ? ?Scheduled Meds: ? sodium chloride   Intravenous Onc

## 2021-05-23 NOTE — Progress Notes (Signed)
? ? ? ?  Subjective: ?Patient is doing better from a pulmonary standpoint.  Plan to transfer out of the ICU, awaiting floor bed this morning.  Remains intermittently confused.  This morning following commands appropriately.  Denies distal numbness and tingling.  Lying comfortably in bed does not appear to be in pain or endorse having any pain. ? ?Objective:  ? ?VITALS:   ?Vitals:  ? 05/23/21 0726 05/23/21 0728 05/23/21 0729 05/23/21 0745  ?BP:      ?Pulse:    93  ?Resp:    20  ?Temp:    97.7 ?F (36.5 ?C)  ?TempSrc:    Oral  ?SpO2: 99% 97% 98% 94%  ?Weight:      ?Height:      ? ? ?Sensation intact distally to the plantar and dorsal aspects of the foot ?Intact pulses distally ?Plantar flexion intact, but unable to demonstrate active ankle dorsiflexion or EHL function ?Incision: prevena dressing holding suction, no output in cannsiter ?Compartment soft ? ? ?Lab Results  ?Component Value Date  ? WBC 10.6 (H) 05/23/2021  ? HGB 7.1 (L) 05/23/2021  ? HCT 21.7 (L) 05/23/2021  ? MCV 85.4 05/23/2021  ? PLT 155 05/23/2021  ? ?BMET ?   ?Component Value Date/Time  ? NA 140 05/23/2021 0124  ? K 3.5 05/23/2021 0124  ? CL 108 05/23/2021 0124  ? CO2 26 05/23/2021 0124  ? GLUCOSE 97 05/23/2021 0124  ? BUN 33 (H) 05/23/2021 0124  ? CREATININE 1.19 05/23/2021 0124  ? CALCIUM 8.1 (L) 05/23/2021 0124  ? GFRNONAA >60 05/23/2021 0124  ? ? ?Xray: Postop x-rays demonstrate excellent reduction of the periprosthetic fracture good alignment of hardware no adverse features. ? ?Assessment/Plan: ?2 Days Post-Op  ? ?Principal Problem: ?  Right femoral fracture ([place]) ?Active Problems: ?  Depression ?  Chronic obstructive pulmonary disease (HCC) ?  AKI (acute kidney injury) ([place]) ?  Status post lobectomy of lung ?  Dehydration ?  Leukocytosis ?  Mixed hyperlipidemia ?  Chronic kidney disease, stage 3a ([place]) ?  Acute on chronic respiratory failure with hypoxia and hypercapnia (HCC) ?  Respiratory acidosis ? ?S/p revision THA for periprosthetic femur  fracture 4/14 ? ?Post op foot drop: AFO ordered, likely intraop stretch injury, no significant swelling in the buttock area and not having much pain so low concern for nerve laceration.  patient endorses intact sensation to dorsal foot, but given his altered mental status may not be a reliable exam. Will continue to monitor. ? ?Post op recs: ?WB: 20% PWB RLE, posterior hip precautions x6 weeks ?Abx: Currently on Zosyn for pneumonia, cefadroxil 500BID x7 days upon discharge home  ?PT when able ?Imaging: PACU pelvis/femur Xray ?Dressing: prevena incisional wound vac to removed in clinic 1 week post op discharge or 2 weeks post op ?DVT prophylaxis: lovenox '40mg'$  daily x 4 weeks ?Follow up: 1 week post discharge for a wound check with Dr. Zachery Dakins at [facility].  ?Address: [address] [place], [place], [place] [address]  ?Office Phone: [phone] ? ? ? ?William Kirk ?05/23/2021, 8:27 AM ? ? ?Charlies Constable, MD ? ?Contact information:   ?Weekdays 7am-5pm epic message Dr. Zachery Dakins, or call office for patient follow up: [phone] ?After hours and holidays please check [email or web address] for group call information for [facility] ? ?  ?

## 2021-05-24 ENCOUNTER — Inpatient Hospital Stay (HOSPITAL_COMMUNITY): Payer: No Typology Code available for payment source

## 2021-05-24 DIAGNOSIS — S72001A Fracture of unspecified part of neck of right femur, initial encounter for closed fracture: Secondary | ICD-10-CM | POA: Diagnosis not present

## 2021-05-24 LAB — COMPREHENSIVE METABOLIC PANEL
ALT: 38 U/L (ref 0–44)
AST: 117 U/L — ABNORMAL HIGH (ref 15–41)
Albumin: 2.3 g/dL — ABNORMAL LOW (ref 3.5–5.0)
Alkaline Phosphatase: 64 U/L (ref 38–126)
Anion gap: 10 (ref 5–15)
BUN: 34 mg/dL — ABNORMAL HIGH (ref 8–23)
CO2: 23 mmol/L (ref 22–32)
Calcium: 8.5 mg/dL — ABNORMAL LOW (ref 8.9–10.3)
Chloride: 111 mmol/L (ref 98–111)
Creatinine, Ser: 1.37 mg/dL — ABNORMAL HIGH (ref 0.61–1.24)
GFR, Estimated: 53 mL/min — ABNORMAL LOW (ref >60)
Glucose, Bld: 70 mg/dL (ref 70–99)
Potassium: 4 mmol/L (ref 3.5–5.1)
Sodium: 144 mmol/L (ref 135–145)
Total Bilirubin: 1.6 mg/dL — ABNORMAL HIGH (ref 0.3–1.2)
Total Protein: 5.4 g/dL — ABNORMAL LOW (ref 6.5–8.1)

## 2021-05-24 LAB — CBC
HCT: 22.5 % — ABNORMAL LOW (ref 39.0–52.0)
Hemoglobin: 7.2 g/dL — ABNORMAL LOW (ref 13.0–17.0)
MCH: 27.7 pg (ref 26.0–34.0)
MCHC: 32 g/dL (ref 30.0–36.0)
MCV: 86.5 fL (ref 80.0–100.0)
Platelets: 215 10*3/uL (ref 150–400)
RBC: 2.6 MIL/uL — ABNORMAL LOW (ref 4.22–5.81)
RDW: 16.3 % — ABNORMAL HIGH (ref 11.5–15.5)
WBC: 11.6 10*3/uL — ABNORMAL HIGH (ref 4.0–10.5)
nRBC: 0 % (ref 0.0–0.2)

## 2021-05-24 LAB — PHOSPHORUS: Phosphorus: 2.3 mg/dL — ABNORMAL LOW (ref 2.5–4.6)

## 2021-05-24 LAB — GLUCOSE, CAPILLARY
Glucose-Capillary: 65 mg/dL — ABNORMAL LOW (ref 70–99)
Glucose-Capillary: 70 mg/dL (ref 70–99)
Glucose-Capillary: 75 mg/dL (ref 70–99)
Glucose-Capillary: 77 mg/dL (ref 70–99)
Glucose-Capillary: 79 mg/dL (ref 70–99)
Glucose-Capillary: 98 mg/dL (ref 70–99)
Glucose-Capillary: 98 mg/dL (ref 70–99)

## 2021-05-24 LAB — VITAMIN B12: Vitamin B-12: 727 pg/mL (ref 180–914)

## 2021-05-24 LAB — MAGNESIUM: Magnesium: 2.2 mg/dL (ref 1.7–2.4)

## 2021-05-24 MED ORDER — QUETIAPINE FUMARATE 25 MG PO TABS
25.0000 mg | ORAL_TABLET | Freq: Two times a day (BID) | ORAL | Status: DC
  Administered 2021-05-24 – 2021-05-29 (×8): 25 mg via ORAL
  Filled 2021-05-24 (×9): qty 1

## 2021-05-24 MED ORDER — KETOROLAC TROMETHAMINE 15 MG/ML IJ SOLN
7.5000 mg | Freq: Once | INTRAMUSCULAR | Status: AC
  Administered 2021-05-24: 7.5 mg via INTRAVENOUS
  Filled 2021-05-24: qty 1

## 2021-05-24 MED ORDER — OSMOLITE 1.5 CAL PO LIQD
1000.0000 mL | ORAL | Status: DC
  Administered 2021-05-24 – 2021-05-26 (×2): 1000 mL
  Filled 2021-05-24 (×3): qty 1000

## 2021-05-24 MED ORDER — AMOXICILLIN-POT CLAVULANATE 875-125 MG PO TABS
1.0000 | ORAL_TABLET | Freq: Two times a day (BID) | ORAL | Status: AC
  Administered 2021-05-24 – 2021-05-27 (×5): 1 via ORAL
  Filled 2021-05-24 (×6): qty 1

## 2021-05-24 MED ORDER — CLOPIDOGREL BISULFATE 75 MG PO TABS: 75.0000 mg | ORAL_TABLET | Freq: Every day | ORAL | Status: DC

## 2021-05-24 MED ORDER — CEFADROXIL 500 MG PO CAPS
500.0000 mg | ORAL_CAPSULE | Freq: Two times a day (BID) | ORAL | Status: DC
  Filled 2021-05-24: qty 1

## 2021-05-24 MED ORDER — VITAL HIGH PROTEIN PO LIQD: 1000.0000 mL | ORAL | Status: DC

## 2021-05-24 MED ORDER — PROSOURCE TF PO LIQD
45.0000 mL | Freq: Two times a day (BID) | ORAL | Status: DC
  Administered 2021-05-24 – 2021-06-02 (×16): 45 mL
  Filled 2021-05-24 (×17): qty 45

## 2021-05-24 MED ORDER — LORAZEPAM 2 MG/ML IJ SOLN
1.0000 mg | INTRAMUSCULAR | Status: DC | PRN
  Administered 2021-05-28 – 2021-05-31 (×3): 1 mg via INTRAVENOUS
  Filled 2021-05-24 (×4): qty 1

## 2021-05-24 MED ORDER — ACETAMINOPHEN 500 MG PO TABS
1000.0000 mg | ORAL_TABLET | Freq: Three times a day (TID) | ORAL | Status: DC
Start: 2021-05-24 — End: 2021-06-01
  Administered 2021-05-24 – 2021-06-01 (×19): 1000 mg via ORAL
  Filled 2021-05-24 (×20): qty 2

## 2021-05-24 MED ORDER — FREE WATER
160.0000 mL | Status: DC
  Administered 2021-05-25 – 2021-06-02 (×37): 160 mL

## 2021-05-24 MED ORDER — DEXTROSE-NACL 5-0.45 % IV SOLN: INTRAVENOUS | Status: DC

## 2021-05-24 NOTE — Progress Notes (Signed)
? ? ? ?  Subjective: ?Patient is doing well on the floor.  Lying comfortably in bed.  Pain was controlled.  Denies distal numbness and tingling.  PRAFO boot in place.  Difficulty still dorsiflexing the foot and toes.  Remains intermittently confused. ? ?Objective:  ? ?VITALS:   ?Vitals:  ? 05/23/21 0745 05/23/21 0940 05/23/21 1658 05/23/21 2000  ?BP:  140/60 137/61 124/62  ?Pulse: 93 95 98 99  ?Resp: 20 (!) '25 20 17  '$ ?Temp: 97.7 ?F (36.5 ?C) 98.4 ?F (36.9 ?C) 98.8 ?F (37.1 ?C) 98.2 ?F (36.8 ?C)  ?TempSrc: Oral Oral Oral Oral  ?SpO2: 94% 96% 94% 97%  ?Weight:      ?Height:      ? ? ?Sensation intact distally to the plantar and dorsal aspects of the foot ?Intact pulses distally ?Plantar flexion intact, but unable to demonstrate active ankle dorsiflexion or EHL function ?Incision: prevena dressing holding suction, no output in cannsiter ?Compartment soft ? ? ?Lab Results  ?Component Value Date  ? WBC 11.6 (H) 05/24/2021  ? HGB 7.2 (L) 05/24/2021  ? HCT 22.5 (L) 05/24/2021  ? MCV 86.5 05/24/2021  ? PLT 215 05/24/2021  ? ?BMET ?   ?Component Value Date/Time  ? NA 144 05/24/2021 0243  ? K 4.0 05/24/2021 0243  ? CL 111 05/24/2021 0243  ? CO2 23 05/24/2021 0243  ? GLUCOSE 70 05/24/2021 0243  ? BUN 34 (H) 05/24/2021 0243  ? CREATININE 1.37 (H) 05/24/2021 0243  ? CALCIUM 8.5 (L) 05/24/2021 0243  ? GFRNONAA 53 (L) 05/24/2021 0243  ? ? ?Xray: Postop x-rays demonstrate excellent reduction of the periprosthetic fracture good alignment of hardware no adverse features. ? ?Assessment/Plan: ?3 Days Post-Op  ? ?Principal Problem: ?  Right femoral fracture ([place]) ?Active Problems: ?  Depression ?  Chronic obstructive pulmonary disease (HCC) ?  AKI (acute kidney injury) ([place]) ?  Status post lobectomy of lung ?  Dehydration ?  Leukocytosis ?  Mixed hyperlipidemia ?  Chronic kidney disease, stage 3a ([place]) ?  Acute on chronic respiratory failure with hypoxia and hypercapnia (HCC) ?  Respiratory acidosis ? ?S/p revision THA for periprosthetic  femur fracture 4/14 ? ?Post op foot drop: AFO ordered, likely intraop stretch injury, no significant swelling in the buttock area and not having much pain so low concern for nerve laceration.  patient endorses intact sensation to dorsal foot, but given his altered mental status may not be a reliable exam. Will continue to monitor. ? ?Post op recs: ?WB: 20% PWB RLE, posterior hip precautions x6 weeks ?Abx: Currently on Zosyn for pneumonia, cefadroxil 500BID x7 days upon discharge home  ?PT for mobilization ?Imaging: PACU pelvis/femur Xray ?Dressing: prevena incisional wound vac to removed in clinic 1 week post op discharge or 2 weeks post op ?DVT prophylaxis: lovenox '40mg'$  daily x 4 weeks ?Follow up: 1 week post discharge for a wound check with Dr. Zachery Dakins at [facility].  ?Address: [address] [place], [place], [place] [address]  ?Office Phone: [phone] ? ? ? ?William Kirk ?05/24/2021, 7:00 AM ? ? ?Charlies Constable, MD ? ?Contact information:   ?Weekdays 7am-5pm epic message Dr. Zachery Dakins, or call office for patient follow up: [phone] ?After hours and holidays please check [email or web address] for group call information for [facility] ? ?  ?

## 2021-05-24 NOTE — Progress Notes (Signed)
? ?  Request for percutaneous gastric tube placement in IR ? ?Dysphagia; aspiration ?Imaging has been reviewed and approved per Dr Serafina Royals ? ?Pt on ASA daily LD yesterday ?LD Plavix few days ago ? ?Will HOLD 5 days for safe placement ?Plan for 4/22 ? ?MD aware and agreeable ? ?

## 2021-05-24 NOTE — Procedures (Signed)
Cortrak ? ?Person Inserting Tube:  Ander Slade, RD ?Tube Type:  Cortrak - 43 inches ?Tube Size:  10 ?Tube Location:  Left nare ?Secured by: Dorann Lodge ?Technique Used to Measure Tube Placement:  Marking at nare/corner of mouth ?Cortrak Secured At:  75 cm ? ?Cortrak Tube Team Note: ? ?Consult received to place a Cortrak feeding tube.  ? ?X-ray is required, abdominal x-ray has been ordered by the Cortrak team. Please confirm tube placement before using the Cortrak tube.  ? ?If the tube becomes dislodged please keep the tube and contact the Cortrak team at [email or web address] (password [email or web address]) for replacement.  ?If after hours and replacement cannot be delayed, place a NG tube and confirm placement with an abdominal x-ray.  ? ? ?Ranell Patrick, RD, LDN ?Clinical Dietitian ?RD pager # available in [place]  ?After hours/weekend pager # available in [place] ? ?

## 2021-05-24 NOTE — TOC Progression Note (Addendum)
Transition of Care (TOC) - Progression Note  ? ? ?Patient Details  ?Name: William Kirk ?MRN: [id number] ?Date of Birth: 07-16-1940 ? ?Transition of Care (TOC) CM/SW Contact  ?Joanne Chars, LCSW ?Phone Number: ?05/24/2021, 2:11 PM ? ?Clinical Narrative:    ?PASSR has been sent for face to face evaluation. ? ?6629: TC B [place], asking for room number for passr face to face eval.  ? ?1450: CSW spoke with wife [name], presented bed offers, she would like to accept offer at [facility].  Waiting on confirmation from [facility]. ? ?Lexine Baton confirms.  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  Lexine Baton will not start auth until DC date is more clear.  ? ? ?Expected Discharge Plan: [place] ?Barriers to Discharge: Continued Medical Work up, SNF Pending bed offer ? ?Expected Discharge Plan and Services ?Expected Discharge Plan: [place] ?In-house Referral: Clinical Social Work ?  ?Post Acute Care Choice: [place] ?Living arrangements for the past 2 months: [place] ?                ?  ?  ?  ?  ?  ?  ?  ?  ?  ?  ? ? ?Social Determinants of Health (SDOH) Interventions ?  ? ?Readmission Risk Interventions ? ?  02/25/2021  ?  2:25 PM 11/08/2018  ? 11:19 AM 10/23/2018  ?  8:44 AM  ?Readmission Risk Prevention Plan  ?Post Dischage Appt Complete    ?Medication Screening Complete    ?Transportation Screening Complete Complete Complete  ?PCP or Specialist Appt within 3-5 Days  Complete Complete  ?[place] or Home Care Consult  Complete Complete  ?Social Work Consult for [place] Planning/Counseling  Complete Complete  ?Palliative Care Screening  Not Applicable Not Applicable  ?Medication Review Press photographer)  Complete Complete  ? ? ?

## 2021-05-24 NOTE — Progress Notes (Addendum)
Pharmacy Antibiotic Note ? ?William Kirk is a 81 y.o. male admitted on 05/19/2021 with  asp pna .  Pharmacy has been consulted for Zosyn dosing. He finished a course of levofloxacin last week for pneumonia. Per ortho, patient to complete cefadroxil '500mg'$  PO BID x 7 days post d/c. D/w Dr. Pietro Cassis, will transition to patient to PO cefadroxil now and continue after discharge per Ortho note. Pharmacy will sign off zosyn consult.  ? ?Plan: ?D/c Zosyn 3.375gm IV q8h ?Start Cefadroxil '500mg'$  PO BID ?Will f/u renal function, micro data, and pts' clinical condition ? ?Height: '5\' 10"'$  (177.8 cm) ?Weight: 70.8 kg (156 lb 1.4 oz) ?IBW/kg (Calculated) : 73 ? ?Temp (24hrs), Avg:98.3 ?F (36.8 ?C), Min:97.6 ?F (36.4 ?C), Max:98.8 ?F (37.1 ?C) ? ?Recent Labs  ?Lab 05/20/21 ?0428 05/21/21 ?0303 05/22/21 ?0522 05/22/21 ?1603 05/23/21 ?0124 05/24/21 ?0243  ?WBC 12.2* 22.7* 20.3* 12.6* 10.6* 11.6*  ?CREATININE 1.47* 1.95* 1.49*  --  1.19 1.37*  ? ?  ?Estimated Creatinine Clearance: 44.5 mL/min (A) (by C-G formula based on SCr of 1.37 mg/dL (H)).   ? ?Allergies  ?Allergen Reactions  ? Testosterone Rash  ? ? ?Antimicrobials this admission: ?4/14 Zosyn >> 4/17 ?4/17 Cefadroxil >> ?4/14 Ancef x 1 (pre-op) ? ?Microbiology results: ?4/14 R hip wound: ngtd ? ?Lashonda Sonneborn A. Levada Dy, PharmD, BCPS, FNKF ?Clinical Pharmacist ?[place] ?Please utilize Amion for appropriate phone number to reach the unit pharmacist ([place]) ? ?Addendum:  ?D/W Dr. Pietro Cassis, Given patient being treated for aspiration PNA, will likely be better covered with augmentin '875mg'$  PO BID. MD agrees. Order received to change to Augmentin PO.  ? ?Gershom Brobeck A. Levada Dy, PharmD, BCPS, FNKF ?Clinical Pharmacist ?[place] ?Please utilize Amion for appropriate phone number to reach the unit pharmacist ([place]) ?  ? ?05/24/2021 8:49 AM ? ?

## 2021-05-24 NOTE — Progress Notes (Signed)
PT Cancellation Note ? ?Patient Details ?Name: William Kirk ?MRN: [id number] ?DOB: 09-11-1940 ? ? ?Cancelled Treatment:    Reason Eval/Treat Not Completed: Discussed pt case with RN who states pt to get Cortrack placed soon. Pt currently calm/sleeping and RN asks that pt not be disturbed before procedure. Will return as schedule allows to continue with PT POC.  ? ? ?[name] ?05/24/2021, 3:33 PM ? ?Rolinda Roan, PT, DPT ?Acute Rehabilitation Services ?Secure Chat Preferred ?Office: [phone] ? ?

## 2021-05-24 NOTE — Progress Notes (Signed)
?PROGRESS NOTE ? ?William Kirk  ?DOB: 13-Jun-1940  ?PCP: Floreen Comber, PA-C ?ZOX:[id number]  ?DOA: 05/19/2021 ? LOS: 5 days  ?Hospital Day: 6 ? ?Brief narrative: ?William Kirk is a 81 y.o. male with PMH significant for HTN, PAD, CKD 3, COPD, dysphagia, bronchiectasis, right lower lobe lobectomy in 2020  due to nonfunctional RLL from multiple aspiration pneumonia. ?Patient presented to the ED on 4/12 after he tripped while going up the steps at home. ?He was found to have right periprosthetic hip fracture, s/p ORIF on 4/14. ?  ?Preoperatively he had desaturations into the 80s on the floor, and was found to be in the 60s on preop.  He was intubated preoperatively due to hypoxia.  He had a prolonged surgery about 4hrs but was able to be extubated in the PACU.  He had prolonged time to wake up.  ABG with mild acute hypercapnia.  He was transferred to icu post op due to  altered mental status, hypoxia, hypercapnia. He required vasopressors briefly , weaned off levophed on 4/15 am. He also required NRB while in the ICU, now on [facility].  He was transferred to [facility] service on 4/16. ?Currently remains altered and is on restraints. ? ?Subjective: ?Patient was seen and examined this morning.  Elderly Caucasian male.  Alert, awake, able to verbalize but not oriented to time place or person. ?On bilateral wrist restraints. ? ?Principal Problem: ?  Right femoral fracture (HCC) ?Active Problems: ?  Depression ?  Chronic obstructive pulmonary disease (HCC) ?  AKI (acute kidney injury) (HCC) ?  Status post lobectomy of lung ?  Pressure injury of skin ?  Dehydration ?  Leukocytosis ?  Mixed hyperlipidemia ?  Chronic kidney disease, stage 3a (HCC) ?  Acute on chronic respiratory failure with hypoxia and hypercapnia (HCC) ?  Respiratory acidosis ?  ?Assessment and Plan: ?Right Periprosthetic Hip Fracture ?-Secondary to above mechanical fall  ?-4/14, s/p revision THA for periprosthetic femur fracture ?-Per orthopedics, 20%  partial weightbearing on the right lower extremity, posterior hip precautions for 6 weeks.  DVT prophylaxis with Lovenox 40 mg daily.  Currently not on any scheduled pain medicines.  Add Tylenol scheduled. ? ?Post op foot drop ?-PRAFO boot in place. ?-post operative care per Ortho, details please see ortho note on 4/16 ? ?Septic shock secondary to aspiration pneumonia - not POA ?Acute hypoxic hypercapnic respiratory failure ?-Was on pressors briefly in the ICU , now off pressors ,blood pressure stable  ?-414, CXR with right-sided pneumonia/atelectasis ?-Intubated preoperatively, was able to extubate postop, briefly required NRB, now on [place] 3 L ?-Currently on IV Zosyn.  Switch to oral Augmentin. ? ?Chronic dysphagia ?Chronic aspiration ?h/o bronchiectasis and RLL lobectomy ?-Aspiration precautions ?-speech eval obtained.  Patient was noted to have significant gross aspiration with any kind of food.  N.p.o. was recommended.  It seems patient had PEG tube in the past in 2022 which was eventually removed. ?  ?COPD w/o Exacerbation,  ?-Continue home Incruse and Dulera ?   ?Acute Metabolic Encephalopathy  ?-Patient remains disoriented, restless and is requiring restraints.   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   ?-Continue frequent reorientation.   ?-Currently on Zoloft 200 mg daily, hydroxyzine 10 mg 3 times daily as needed. ?-Start Seroquel 25 mg twice daily. ?-While n.p.o., as needed Ativan ordered. ?  ?Acute on chronic anemia ?-Baseline hemoglobin more than 11. ?-This hospitalization, hemoglobin is running close  to 7.  Lowest was 6.5 for which 1 unit of PRBC was given ?-Transfuse if less than 7. ?-Continue Lovenox and Plavix. ?Recent Labs  ?  05/22/21 ?0522 05/22/21 ?1603 05/22/21 ?1759 05/23/21 ?0124 05/24/21 ?0243  ?HGB 7.4* 6.7* 6.5* 7.1* 7.2*  ?MCV 86.0 86.4  --  85.4 86.5  ? ?AKI on CKD IIIa  ?-Baseline creatinine  less than 1.5.  Creatinine was elevated to peak of 1.95.  Improving now.  Continue to monitor. ?Recent Labs  ?  01/05/21 ?1248 02/22/21 ?1034 02/25/21 ?0206 03/05/21 ?5409 05/19/21 ?1645 05/20/21 ?0428 05/21/21 ?0303 05/22/21 ?0522 05/23/21 ?0124 05/24/21 ?0243  ?BUN  --  22 26* 20 25* 27* 36* 37* 33* 34*  ?CREATININE 1.50* 1.42* 1.43* 1.30* 1.40* 1.47* 1.95* 1.49* 1.19 1.37*  ? ?PVD/HLD ?PAD with intermittent claudication on the left LE and asymptomatic carotid stenosis  ?S/p RIGHT TRANSCAROTID ARTERY REVASCULARIZATION 02/2021 ?-Because of low hemoglobin, Plavix was held on 4/14. ?-4/17, resume Plavix.  Continue aspirin and statin ?  ?Goals of care ?  Code Status: Full Code  ? ? ?Mobility: Needs continued PT follow-up unable to follow commands ? ?Skin assessment:  ?Pressure Injury Heel Posterior;Right Stage 1 -  Intact skin with non-blanchable redness of a localized area usually over a bony prominence. (Active)  ?   ?Location: Heel  ?Location Orientation: Posterior;Right  ?Staging: Stage 1 -  Intact skin with non-blanchable redness of a localized area usually over a bony prominence.  ?Wound Description (Comments):   ?Present on Admission: Yes  ?   ?Pressure Injury 05/22/21 Toe (Comment  which one) Anterior;Left scabed over (Active)  ?05/22/21 0700  ?Location: Toe (Comment  which one) (5th toe)  ?Location Orientation: Anterior;Left  ?Staging:   ?Wound Description (Comments): scabed over  ?Present on Admission:   ? ? ?Nutritional status:  ?Body mass index is 22.4 kg/m?.  ?  ?  ? ? ? ? ?Diet:  ?Diet Order   ? ?       ?  Diet NPO time specified Except for: Ice Chips  Diet effective now       ?  ? ?  ?  ? ?  ? ? ?DVT prophylaxis:  ?enoxaparin (LOVENOX) injection 40 mg Start: 05/22/21 1000 ?SCDs Start: 05/20/21 0011 ?  ?Antimicrobials: Augmentin ?Fluid: Start D5 half NS at 75 mill per hour ?Consultants: Orthopedics ?Family Communication: Called and updated patient's wife this morning.  She confirmed that he had PEG tube  in 2022 and that was removed after his swelling improved in few months.  She wants another PEG tube put in.  IR consulted. ? ?Status is: Inpatient ? ?Continue in-hospital care because: Remains altered ?Level of care: Med-Surg  ? ?Dispo: The patient is from: Home ?             Anticipated d/c is to: SNF ?             Patient currently is not medically stable to d/c. ?  Difficult to place patient No ? ? ? ? ?Infusions:  ? sodium chloride    ? dextrose 5 % and 0.45% NaCl    ? ? ?Scheduled Meds: ? sodium chloride   Intravenous Once  ? acetaminophen  1,000 mg Oral Q8H  ? amoxicillin-clavulanate  1 tablet Oral Q12H  ? aspirin  81 mg Oral Daily  ? atorvastatin  80 mg Oral QHS  ? chlorhexidine  15 mL Mouth Rinse BID  ? Chlorhexidine Gluconate Cloth  6 each Topical  Daily  ? Chlorhexidine Gluconate Cloth  6 each Topical Q0600  ? clopidogrel  75 mg Oral Daily  ? enoxaparin  40 mg Subcutaneous Q24H  ? mouth rinse  15 mL Mouth Rinse q12n4p  ? mometasone-formoterol  2 puff Inhalation BID  ? QUEtiapine  25 mg Oral BID  ? sertraline  200 mg Oral Daily  ? umeclidinium bromide  1 puff Inhalation q AM  ? ? ?PRN meds: ?hydrALAZINE, hydrOXYzine, ipratropium-albuterol, LORazepam  ? ?Antimicrobials: ?Anti-infectives (From admission, onward)  ? ? Start     Dose/Rate Route Frequency Ordered Stop  ? 05/24/21 1200  amoxicillin-clavulanate (AUGMENTIN) 875-125 MG per tablet 1 tablet       ? 1 tablet Oral Every 12 hours 05/24/21 1101    ? 05/24/21 1000  cefadroxil (DURICEF) capsule 500 mg  Status:  Discontinued       ? 500 mg Oral 2 times daily 05/24/21 0855 05/24/21 1101  ? 05/22/21 0600  ceFAZolin (ANCEF) IVPB 2g/100 mL premix  Status:  Discontinued       ? 2 g ?200 mL/hr over 30 Minutes Intravenous On call to O.R. 05/21/21 1517 05/21/21 1552  ? 05/21/21 2200  piperacillin-tazobactam (ZOSYN) IVPB 3.375 g  Status:  Discontinued       ? 3.375 g ?12.5 mL/hr over 240 Minutes Intravenous Every 8 hours 05/21/21 1731 05/24/21 0855  ? 05/21/21 1745   piperacillin-tazobactam (ZOSYN) IVPB 3.375 g       ? 3.375 g ?100 mL/hr over 30 Minutes Intravenous STAT 05/21/21 1731 05/21/21 1925  ? 05/21/21 1530  ceFAZolin (ANCEF) IVPB 2g/100 mL premix  Status:  Disco

## 2021-05-24 NOTE — Progress Notes (Signed)
Initial Nutrition Assessment ? ?DOCUMENTATION CODES:  ? ?Severe malnutrition in context of chronic illness ? ?INTERVENTION:  ? ?Initiate Osmolite 1.5 @ 20 ml/hr via cortrak tube and increase by 10 ml every 12 hours to goal rate of 60 ml/hr.  ? ?45 ml Prosource TF BID.   ? ?160 ml free water flush every 4 hours ? ?Tube feeding regimen provides 2240 kcal (100% of needs), 112 grams of protein, and 1097 ml of H2O.  Total free water: 2057 ml daily ? ?-Monitor Mg, K, and Phos daily and replete as needed secondary to high refeeding risk ? ?NUTRITION DIAGNOSIS:  ? ?Severe Malnutrition related to chronic illness (COPD) as evidenced by severe fat depletion, severe muscle depletion. ? ?GOAL:  ? ?Patient will meet greater than or equal to 90% of their needs ? ?MONITOR:  ? ?Labs, Weight trends, TF tolerance, Skin, I & O's, Diet advancement ? ?REASON FOR ASSESSMENT:  ? ?Rounds ?  ? ?ASSESSMENT:  ? ?William Kirk is a 81 y.o. male with PMH significant for HTN, PAD, CKD 3, COPD, dysphagia, bronchiectasis, right lower lobe lobectomy in 2020  due to nonfunctional RLL from multiple aspiration pneumonia. ? ?Pt admitted with rt periprosthetic hip fracture s/p mechanical fall.  ? ?4/14- s/p Procedure(s): ?Open reduction internal fixation of periprosthetic right femur fracture removal of loose femoral implant, revision to modular diaphyseal engaging revision femoral component and dual mobility head ?4/15- s/p BSE- recommend NPO ?4/16- s/p BSE- recommend NPO with use of artifical means of nutrition and hydration due to ongoing aspiration risk ?4/17- cortrak placed, TF initiated ? ?Reviewed I/O's: -309 ml x 24 hours and +2.8 L since admission ? ?UOP: 200 ml x 24 hours ? ?Pt unavailable at time of visit. Attempted to speak with pt via call to hospital room phone, however, unable to reach. RD unable to obtain further nutrition-related history or complete nutrition-focused physical exam at this time.   ? ?Pt currently NPO. Per SLP notes, pt  with high aspiration risk. Per radiology, will place g-tube on 05/29/21 (held secondary to plavix). Cortrak to be placed today. Case discussed with MD; ok to start feedings after tube is placed.  ? ?Reviewed wt hx; pt has experienced a 2.5% wt loss over the past 3 months, which is not significant for time frame.  ? ?Medications reviewed and include dextrose 5%-0.45% sodium chloride infusion @ 75 ml/hr.  ? ?No results found for: HGBA1C PTA DM medications are none.  ? ?Labs reviewed: CBGS: 65-96 (inpatient orders for glycemic control are none).   ? ?NUTRITION - FOCUSED PHYSICAL EXAM: ? ?Flowsheet Row Most Recent Value  ?Orbital Region Mild depletion  ?Upper Arm Region Severe depletion  ?Thoracic and Lumbar Region Severe depletion  ?Buccal Region Mild depletion  ?Temple Region No depletion  ?Clavicle Bone Region Severe depletion  ?Clavicle and Acromion Bone Region Severe depletion  ?Scapular Bone Region Severe depletion  ?Dorsal Hand Severe depletion  ?Patellar Region Severe depletion  ?Anterior Thigh Region Severe depletion  ?Posterior Calf Region Severe depletion  ?Edema (RD Assessment) None  ?Hair Reviewed  ?Eyes Reviewed  ?Mouth Reviewed  [dry, scabbed tongue]  ?Skin Reviewed  ?Nails Reviewed  ? ?  ? ? ?Diet Order:   ?Diet Order   ? ?       ?  Diet NPO time specified Except for: Ice Chips  Diet effective now       ?  ? ?  ?  ? ?  ? ? ?EDUCATION NEEDS:  ? ?  Not appropriate for education at this time ? 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 ?Last BM:  05/24/21 ? ?Height:  ? ?Ht Readings from Last 1 Encounters:  ?05/19/21 '5\' 10"'$  (1.778 m)  ? ? ?Weight:  ? ?Wt Readings from Last 1 Encounters:  ?05/21/21 70.8 kg  ? ? ?Ideal Body Weight:  75.5 kg ? ?BMI:  Body mass index is 22.4 kg/m?. ? ?Estimated Nutritional Needs:  ? ?Kcal:  2100-2300 ? ?Protein:  105-120  grams ? ?Fluid:  > 2 L ? ? ? ?Loistine Chance, RD, LDN, CDCES ?Registered Dietitian II ?Certified Diabetes Care and Education Specialist ?Please refer to [facility] for RD and/or RD on-call/weekend/after hours pager  ?

## 2021-05-25 DIAGNOSIS — S72001A Fracture of unspecified part of neck of right femur, initial encounter for closed fracture: Secondary | ICD-10-CM | POA: Diagnosis not present

## 2021-05-25 LAB — RETICULOCYTES
Immature Retic Fract: 27 % — ABNORMAL HIGH (ref 2.3–15.9)
RBC.: 2.8 MIL/uL — ABNORMAL LOW (ref 4.22–5.81)
Retic Count, Absolute: 53.2 10*3/uL (ref 19.0–186.0)
Retic Ct Pct: 1.9 % (ref 0.4–3.1)

## 2021-05-25 LAB — CBC WITH DIFFERENTIAL/PLATELET
Abs Immature Granulocytes: 0.63 10*3/uL — ABNORMAL HIGH (ref 0.00–0.07)
Basophils Absolute: 0 10*3/uL (ref 0.0–0.1)
Basophils Relative: 0 %
Eosinophils Absolute: 0.2 10*3/uL (ref 0.0–0.5)
Eosinophils Relative: 2 %
HCT: 24.2 % — ABNORMAL LOW (ref 39.0–52.0)
Hemoglobin: 8 g/dL — ABNORMAL LOW (ref 13.0–17.0)
Immature Granulocytes: 5 %
Lymphocytes Relative: 9 %
Lymphs Abs: 1.1 10*3/uL (ref 0.7–4.0)
MCH: 28.3 pg (ref 26.0–34.0)
MCHC: 33.1 g/dL (ref 30.0–36.0)
MCV: 85.5 fL (ref 80.0–100.0)
Monocytes Absolute: 0.7 10*3/uL (ref 0.1–1.0)
Monocytes Relative: 6 %
Neutro Abs: 9 10*3/uL — ABNORMAL HIGH (ref 1.7–7.7)
Neutrophils Relative %: 78 %
Platelets: 243 10*3/uL (ref 150–400)
RBC: 2.83 MIL/uL — ABNORMAL LOW (ref 4.22–5.81)
RDW: 16.3 % — ABNORMAL HIGH (ref 11.5–15.5)
WBC: 11.6 10*3/uL — ABNORMAL HIGH (ref 4.0–10.5)
nRBC: 0.2 % (ref 0.0–0.2)

## 2021-05-25 LAB — BASIC METABOLIC PANEL
Anion gap: 6 (ref 5–15)
BUN: 34 mg/dL — ABNORMAL HIGH (ref 8–23)
CO2: 26 mmol/L (ref 22–32)
Calcium: 8.3 mg/dL — ABNORMAL LOW (ref 8.9–10.3)
Chloride: 112 mmol/L — ABNORMAL HIGH (ref 98–111)
Creatinine, Ser: 0.88 mg/dL (ref 0.61–1.24)
GFR, Estimated: >60 mL/min (ref >60)
Glucose, Bld: 127 mg/dL — ABNORMAL HIGH (ref 70–99)
Potassium: 2.9 mmol/L — ABNORMAL LOW (ref 3.5–5.1)
Sodium: 144 mmol/L (ref 135–145)

## 2021-05-25 LAB — PHOSPHORUS
Phosphorus: 2 mg/dL — ABNORMAL LOW (ref 2.5–4.6)
Phosphorus: 1.8 mg/dL — ABNORMAL LOW (ref 2.5–4.6)

## 2021-05-25 LAB — FOLATE: Folate: 14.8 ng/mL (ref >5.9)

## 2021-05-25 LAB — IRON AND TIBC
Iron: 20 ug/dL — ABNORMAL LOW (ref 45–182)
Saturation Ratios: 14 % — ABNORMAL LOW (ref 17.9–39.5)
TIBC: 147 ug/dL — ABNORMAL LOW (ref 250–450)
UIBC: 127 ug/dL

## 2021-05-25 LAB — FERRITIN: Ferritin: 900 ng/mL — ABNORMAL HIGH (ref 24–336)

## 2021-05-25 LAB — GLUCOSE, CAPILLARY
Glucose-Capillary: 111 mg/dL — ABNORMAL HIGH (ref 70–99)
Glucose-Capillary: 134 mg/dL — ABNORMAL HIGH (ref 70–99)
Glucose-Capillary: 134 mg/dL — ABNORMAL HIGH (ref 70–99)
Glucose-Capillary: 99 mg/dL (ref 70–99)

## 2021-05-25 LAB — MAGNESIUM
Magnesium: 2.1 mg/dL (ref 1.7–2.4)
Magnesium: 1.8 mg/dL (ref 1.7–2.4)

## 2021-05-25 MED ORDER — DEXTROSE-NACL 5-0.45 % IV SOLN: INTRAVENOUS | Status: DC

## 2021-05-25 NOTE — Progress Notes (Signed)
?PROGRESS NOTE ? ?William Kirk  ?DOB: 01/12/1941  ?PCP: Floreen Comber, PA-C ?[id number]  ?DOA: 05/19/2021 ? LOS: 6 days  ?Hospital Day: 7 ? ?Brief narrative: ?William Kirk is a 81 y.o. male with PMH significant for HTN, PAD, CKD 3, COPD, dysphagia, bronchiectasis, right lower lobe lobectomy in 2020  due to nonfunctional RLL from multiple aspiration pneumonia. ?Patient presented to the ED on 4/12 after he tripped while going up the steps at home. ?He was found to have right periprosthetic hip fracture, s/p ORIF on 4/14. ?  ?Preoperatively he had desaturations into the 80s on the floor, and was found to be in the 60s on preop.  He was intubated preoperatively due to hypoxia.  He had a prolonged surgery about 4hrs but was able to be extubated in the PACU.  He had prolonged time to wake up.  ABG with mild acute hypercapnia.  He was transferred to icu post op due to  altered mental status, hypoxia, hypercapnia. He required vasopressors briefly , weaned off levophed on 4/15 am. He also required NRB while in the ICU, now on [facility].  He was transferred to [facility] service on 4/16. ?Currently remains altered and is on restraints. ? ?Subjective: ?Patient was seen and examined this morning.   ?Propped up in bed.  Has NG tube in place.  Bilateral restraints in place.  Per nursing staff, his mental status is gradually better after he was able to be given meds through NG tube. ? ? Principal Problem: ?  Right femoral fracture (HCC) ?Active Problems: ?  Depression ?  Chronic obstructive pulmonary disease (HCC) ?  AKI (acute kidney injury) (HCC) ?  Status post lobectomy of lung ?  Pressure injury of skin ?  Dehydration ?  Leukocytosis ?  Mixed hyperlipidemia ?  Chronic kidney disease, stage 3a (HCC) ?  Acute on chronic respiratory failure with hypoxia and hypercapnia (HCC) ?  Respiratory acidosis ?  ?Assessment and Plan: ?Right Periprosthetic Hip Fracture ?-Secondary to mechanical fall  ?-4/14, s/p revision THA for  periprosthetic femur fracture ?-Per orthopedics, 20% partial weightbearing on the right lower extremity, posterior hip precautions for 6 weeks.  DVT prophylaxis with Lovenox 40 mg daily.  Currently not on any scheduled pain medicines.  Add Tylenol scheduled. ? ?Post op foot drop ?-PRAFO boot in place. ?-post operative care per Ortho, details please see ortho note on 4/16 ? ?Septic shock secondary to aspiration pneumonia - not POA ?Acute hypoxic hypercapnic respiratory failure ?-Was on pressors briefly in the ICU , now off pressors ,blood pressure stable  ?-414, CXR with right-sided pneumonia/atelectasis ?-Intubated preoperatively, was able to extubate postop, briefly required NRB, now on  3 L ?-Currently on oral Augmentin. ? ?Chronic dysphagia ?Chronic aspiration ?h/o bronchiectasis and RLL lobectomy ?-Aspiration precautions ?-speech eval obtained.  Patient was noted to have significant gross aspiration with any kind of food.  N.p.o. was recommended.  It seems patient had PEG tube in the past in 2022 which was eventually removed. ?  ?COPD w/o Exacerbation,  ?-Continue home Incruse and Dulera ?   ?Acute Metabolic Encephalopathy  ?-Patient remains disoriented, restless and is requiring restraints.   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   ?-Continue frequent reorientation.   ?-Currently on Zoloft 200 mg daily, Seroquel 25 mg twice daily and hydroxyzine 10 mg 3 times daily as needed. ?-Mental status slightly better after patient was able to get meds from  NG tube.  Continue to monitor. ?-As needed Haldol.  QTc less than 400 ms. ?  ?Acute on chronic anemia ?-Baseline hemoglobin more than 11. ?-This hospitalization, hemoglobin is running close to 7.  Lowest was 6.5 for which 1 unit of PRBC was given.  Hemoglobin 8 this morning.  Continue to monitor. ?-Transfuse if less than 7. ?-Continue Lovenox and Plavix. ?Recent Labs  ?   05/22/21 ?1603 05/22/21 ?1759 05/23/21 ?0124 05/24/21 ?0243 05/25/21 ?0428  ?HGB 6.7* 6.5* 7.1* 7.2* 8.0*  ?MCV 86.4  --  85.4 86.5 85.5  ?OZHYQMVH84  --   --   --  727  --   ?FOLATE  --   --   --   --  14.8  ?FERRITIN  --   --   --   --  900*  ?TIBC  --   --   --   --  147*  ?IRON  --   --   --   --  20*  ?RETICCTPCT  --   --   --   --  1.9  ? ?AKI on CKD IIIa  ?-Baseline creatinine less than 1.5.  Creatinine was elevated to peak of 1.95.  Improving now.  Continue to monitor. ?Recent Labs  ?  02/22/21 ?1034 02/25/21 ?0206 03/05/21 ?6962 05/19/21 ?1645 05/20/21 ?0428 05/21/21 ?0303 05/22/21 ?0522 05/23/21 ?0124 05/24/21 ?0243 05/25/21 ?1056  ?BUN 22 26* 20 25* 27* 36* 37* 33* 34* 34*  ?CREATININE 1.42* 1.43* 1.30* 1.40* 1.47* 1.95* 1.49* 1.19 1.37* 0.88  ? ?PVD/HLD ?PAD with intermittent claudication on the left LE and asymptomatic carotid stenosis  ?S/p RIGHT TRANSCAROTID ARTERY REVASCULARIZATION 02/2021 ?-Because of low hemoglobin, Plavix was held on 4/14. ?-4/17, resume Plavix.  Continue aspirin and statin ?  ?Goals of care ?  Code Status: Full Code  ? ? ?Mobility: Needs continued PT follow-up unable to follow commands ? ?Skin assessment:  ?Pressure Injury Heel Posterior;Right Stage 1 -  Intact skin with non-blanchable redness of a localized area usually over a bony prominence. (Active)  ?   ?Location: Heel  ?Location Orientation: Posterior;Right  ?Staging: Stage 1 -  Intact skin with non-blanchable redness of a localized area usually over a bony prominence.  ?Wound Description (Comments):   ?Present on Admission: Yes  ?   ?Pressure Injury 05/22/21 Toe (Comment  which one) Anterior;Left scabed over (Active)  ?05/22/21 0700  ?Location: Toe (Comment  which one) (5th toe)  ?Location Orientation: Anterior;Left  ?Staging:   ?Wound Description (Comments): scabed over  ?Present on Admission:   ? ? ?Nutritional status:  ?Body mass index is 23.06 kg/m?Marland Kitchen  ?Nutrition Problem: Severe Malnutrition ?Etiology: chronic illness  (COPD) ?Signs/Symptoms: severe fat depletion, severe muscle depletion ? ? ? ? ?Diet:  ?Diet Order   ? ?       ?  Diet NPO time specified Except for: Ice Chips  Diet effective now       ?  ? ?  ?  ? ?  ? ? ?DVT prophylaxis:  ?enoxaparin (LOVENOX) injection 40 mg Start: 05/22/21 1000 ?SCDs Start: 05/20/21 0011 ?  ?Antimicrobials: Augmentin ?Fluid: Currently on D5 half NS at 75 mill per hour.  Can stop IV fluid as patient is getting tube feeding. ?Consultants: Orthopedics ?Family Communication: Called and updated patient's wife this morning.  She confirmed that he had PEG tube in 2022 and that was removed after his swelling improved in few months.  She wants another PEG tube put in.  IR consulted. ? ?  Status is: Inpatient ? ?Continue in-hospital care because: Remains altered ?Level of care: Med-Surg  ? ?Dispo: The patient is from: Home ?             Anticipated d/c is to: SNF ?             Patient currently is not medically stable to d/c. ?  Difficult to place patient No ? ? ? ? ?Infusions:  ? sodium chloride    ? feeding supplement (OSMOLITE 1.5 CAL) 60 mL/hr at 05/25/21 0256  ? ? ?Scheduled Meds: ? sodium chloride   Intravenous Once  ? acetaminophen  1,000 mg Oral Q8H  ? amoxicillin-clavulanate  1 tablet Oral Q12H  ? atorvastatin  80 mg Oral QHS  ? chlorhexidine  15 mL Mouth Rinse BID  ? Chlorhexidine Gluconate Cloth  6 each Topical Q0600  ? enoxaparin  40 mg Subcutaneous Q24H  ? feeding supplement (PROSource TF)  45 mL Per Tube BID  ? free water  160 mL Per Tube Q4H  ? mouth rinse  15 mL Mouth Rinse q12n4p  ? mometasone-formoterol  2 puff Inhalation BID  ? QUEtiapine  25 mg Oral BID  ? sertraline  200 mg Oral Daily  ? umeclidinium bromide  1 puff Inhalation q AM  ? ? ?PRN meds: ?hydrALAZINE, hydrOXYzine, ipratropium-albuterol, LORazepam  ? ?Antimicrobials: ?Anti-infectives (From admission, onward)  ? ? Start     Dose/Rate Route Frequency Ordered Stop  ? 05/24/21 1200  amoxicillin-clavulanate (AUGMENTIN) 875-125 MG  per tablet 1 tablet       ? 1 tablet Oral Every 12 hours 05/24/21 1101 05/27/21 2359  ? 05/24/21 1000  cefadroxil (DURICEF) capsule 500 mg  Status:  Discontinued       ? 500 mg Oral 2 times daily 05/24/21 0855

## 2021-05-25 NOTE — Progress Notes (Signed)
Physical Therapy Treatment ?Patient Details ?Name: William Kirk ?MRN: [id number] ?DOB: November 29, 1940 ?Today's Date: 05/25/2021 ? ? ?History of Present Illness 81 y.o. male who presents to the emergency department via EMS from home after sustaining a mechanical fall PTA. periprosthetic right femoral fracture Patient tripped while going up the steps. 4/14 ORIF with replacing the femoral component of previous THA; post-op confusion with hypoxia; required vasopressors;   PMH significant of PVD, COPD, hyperlipidemia, CKD 3A ? ?  ?PT Comments  ? ? Pt was seen for mobility on bed with help to roll and reposition to get cleaned up, as well as manage his hip precautions and disrupted lines.  Pt continually tries to reach his face and lines, and was returned to restrained posture with UE's after the visit.  Nursing in to assist PT for movement on bed to get cleaned up and comfortable.  Pt is very fatigued with the effort to move, and will encourage him to be OOB in chair when safe to do so.  Recommend SNF due to his ability to assist moving prior to the fall at home, and recommend he be considered as well for ALF if appropriate at the end of rehab given his fall history and the available help for him at home.   ?Recommendations for follow up therapy are one component of a multi-disciplinary discharge planning process, led by the attending physician.  Recommendations may be updated based on patient status, additional functional criteria and insurance authorization. ? ?Follow Up Recommendations ? Skilled nursing-short term rehab (<3 hours/day) ?  ?  ?Assistance Recommended at Discharge Frequent or constant Supervision/Assistance  ?Patient can return home with the following Two people to help with walking and/or transfers;Assistance with cooking/housework;Two people to help with bathing/dressing/bathroom;Direct supervision/assist for medications management;Direct supervision/assist for financial management;Assist for  transportation;Help with stairs or ramp for entrance ?  ?Equipment Recommendations ? Rolling walker (2 wheels);Wheelchair (measurements PT);Wheelchair cushion (measurements PT);BSC/3in1  ?  ?Recommendations for Other Services OT consult ? ? ?  ?Precautions / Restrictions Precautions ?Precautions: Posterior Hip;Fall ?Precaution Booklet Issued: No ?Precaution Comments: pt cannot verbalize precautions ?Restrictions ?Weight Bearing Restrictions: Yes ?RLE Weight Bearing: Partial weight bearing ?RLE Partial Weight Bearing Percentage or Pounds: 20  ?  ? ?Mobility ? Bed Mobility ?Overal bed mobility: Needs Assistance ?Bed Mobility: Rolling ?Rolling: Mod assist, +2 for physical assistance, +2 for safety/equipment ?  ?  ?  ?  ?General bed mobility comments: pt was assisted to roll and maintain safety of hip on RLE with cleaning up from a large episode of incontinence, completely soaking lower gown and bed ?  ? ?Transfers ?  ?  ?  ?  ?  ?  ?  ?  ?  ?  ?  ? ?Ambulation/Gait ?  ?  ?  ?  ?  ?  ?  ?  ? ? ?Stairs ?  ?  ?  ?  ?  ? ? ?Wheelchair Mobility ?  ? ?Modified Rankin (Stroke Patients Only) ?  ? ? ?  ?Balance Overall balance assessment: History of Falls, Needs assistance ?  ?  ?  ?  ?  ?  ?  ?  ?  ?  ?  ?  ?  ?  ?  ?  ?  ?  ?  ? ?  ?Cognition Arousal/Alertness: Awake/alert ?Behavior During Therapy: Impulsive, Restless ?Overall Cognitive Status: No family/caregiver present to determine baseline cognitive functioning ?  ?  ?  ?  ?  ?  ?  ?  ?  ?  ?  ?  ?  ?  ?  ?  ?  General Comments: pt is not able to avoid reaching to pull at his lines repetitively even when reminded not to do so ?  ?  ? ?  ?Exercises   ? ?  ?General Comments General comments (skin integrity, edema, etc.): pt was in bed with new cortrak and restrained on UE's, has consistently reached up to grab at his face with hands unrestrained.  Noted condom cath removed, and there is a place on R thigh on wound vac that looks unsealed.  Nursing in to room to work on these  issues, as well as large episode of incontinence with gown and bed pads, pt all being covered in bowel movement ?  ?  ? ?Pertinent Vitals/Pain Pain Assessment ?Pain Assessment: Faces ?Faces Pain Scale: Hurts little more ?Breathing: normal ?Negative Vocalization: none ?Facial Expression: smiling or inexpressive ?Body Language: relaxed ?Consolability: no need to console ?PAINAD Score: 0  ? ? ?Home Living   ?  ?  ?  ?  ?  ?  ?  ?  ?  ?   ?  ?Prior Function    ?  ?  ?   ? ?PT Goals (current goals can now be found in the care plan section)   ? ?  ?Frequency ? ? ? Min 3X/week ? ? ? ?  ?PT Plan Current plan remains appropriate  ? ? ?Co-evaluation   ?  ?  ?  ?  ? ?  ?AM-PAC PT "6 Clicks" Mobility   ?Outcome Measure ? Help needed turning from your back to your side while in a flat bed without using bedrails?: A Lot ?Help needed moving from lying on your back to sitting on the side of a flat bed without using bedrails?: A Lot ?Help needed moving to and from a bed to a chair (including a wheelchair)?: Total ?Help needed standing up from a chair using your arms (e.g., wheelchair or bedside chair)?: Total ?Help needed to walk in hospital room?: Total ?Help needed climbing 3-5 steps with a railing? : Total ?6 Click Score: 8 ? ?  ?End of Session Equipment Utilized During Treatment: Oxygen ?Activity Tolerance: Patient limited by fatigue;Patient limited by pain;Treatment limited secondary to medical complications (Comment) ?Patient left: in bed;with call bell/phone within reach;with bed alarm set;with restraints reapplied ?Nurse Communication: Mobility status ?PT Visit Diagnosis: Other abnormalities of gait and mobility (R26.89);History of falling (Z91.81) ?  ? ? ?Time: 1914-7829 ?PT Time Calculation (min) (ACUTE ONLY): 43 min ? ?Charges:  $Therapeutic Activity: 23-37 mins ?$Self Care/Home Management: 8-22   ?Ivar Drape ?05/25/2021, 5:22 PM ? ?Samul Dada, PT PhD ?Acute Rehab Dept. Number: [facility] [phone] and [facility] [phone] ? ? ?

## 2021-05-25 NOTE — Progress Notes (Signed)
SLP Cancellation Note ? ?Patient Details ?Name: William Kirk ?MRN: [id number] ?DOB: [birth year] ? ? ?Cancelled treatment:        PT getting ready to work with pt. Will continue efforts.  ? ? ?William Kirk ?05/25/2021, 2:14 PM ?

## 2021-05-25 NOTE — Progress Notes (Signed)
? ? ? ?  Subjective: ?Patient remains confused.  Lying comfortably in bed.  Pain was controlled.  Speech therapy concerned patient is high risk for aspiration, currently has NGT, plan for G tube placement.Marland Kitchen  PRAFO boot in place.  Difficulty still dorsiflexing the foot and toes.  Remains intermittently confused. ? ?Objective:  ? ?VITALS:   ?Vitals:  ? 05/24/21 2042 05/24/21 2044 05/25/21 0344 05/25/21 0500  ?BP:   (!) 147/61   ?Pulse:   73   ?Resp: 20  17   ?Temp: (!) 97.4 ?F (36.3 ?C)  98.1 ?F (36.7 ?C)   ?TempSrc: Oral     ?SpO2: 90% 92% 96%   ?Weight:    72.9 kg  ?Height:      ? ? ?Sensation intact distally to the plantar and dorsal aspects of the foot ?Intact pulses distally ?Plantar flexion intact, but unable to demonstrate active ankle dorsiflexion or EHL function ?Skin checked under prafo, skin intact no sores ?Incision: prevena dressing holding suction, no output in cannsiter ?Compartment soft ? ? ?Lab Results  ?Component Value Date  ? WBC 11.6 (H) 05/25/2021  ? HGB 8.0 (L) 05/25/2021  ? HCT 24.2 (L) 05/25/2021  ? MCV 85.5 05/25/2021  ? PLT 243 05/25/2021  ? ?BMET ?   ?Component Value Date/Time  ? NA 144 05/24/2021 0243  ? K 4.0 05/24/2021 0243  ? CL 111 05/24/2021 0243  ? CO2 23 05/24/2021 0243  ? GLUCOSE 70 05/24/2021 0243  ? BUN 34 (H) 05/24/2021 0243  ? CREATININE 1.37 (H) 05/24/2021 0243  ? CALCIUM 8.5 (L) 05/24/2021 0243  ? GFRNONAA 53 (L) 05/24/2021 0243  ? ? ?Xray: Postop x-rays demonstrate excellent reduction of the periprosthetic fracture good alignment of hardware no adverse features. ? ?Assessment/Plan: ?4 Days Post-Op  ? ?Principal Problem: ?  Right femoral fracture ([place]) ?Active Problems: ?  Depression ?  Chronic obstructive pulmonary disease (HCC) ?  AKI (acute kidney injury) ([place]) ?  Status post lobectomy of lung ?  Pressure injury of skin ?  Dehydration ?  Leukocytosis ?  Mixed hyperlipidemia ?  Chronic kidney disease, stage 3a ([place]) ?  Acute on chronic respiratory failure with hypoxia and  hypercapnia (HCC) ?  Respiratory acidosis ? ?S/p revision THA for periprosthetic femur fracture 4/14 ? ?Post op foot drop: AFO ordered, likely intraop stretch injury, no significant swelling in the buttock area and not having much pain so low concern for nerve laceration.  patient endorses intact sensation to dorsal foot, but given his altered mental status may not be a reliable exam. Will continue to monitor. ? ?Post op recs: ?WB: 20% PWB RLE, posterior hip precautions x6 weeks ?Abx: Currently on Zosyn for pneumonia, cefadroxil 500BID x7 days upon discharge home  ?PT for mobilization ?Imaging: PACU pelvis/femur Xray ?Dressing: prevena incisional wound vac to removed in clinic 1 week post op discharge or 2 weeks post op ?DVT prophylaxis: lovenox '40mg'$  daily x 4 weeks ?Follow up: 1 week post discharge for a wound check with Dr. Zachery Dakins at [facility].  ?Address: [address] [place], [place], [place] [address]  ?Office Phone: [phone] ? ? ? ?Hussain Maimone A Charmin Aguiniga ?05/25/2021, 7:18 AM ? ? ?Charlies Constable, MD ? ?Contact information:   ?Weekdays 7am-5pm epic message Dr. Zachery Dakins, or call office for patient follow up: [phone] ?After hours and holidays please check [email or web address] for group call information for [facility] ? ?  ?

## 2021-05-25 NOTE — Progress Notes (Signed)
Patient ID: William Kirk, male   DOB: 07-04-1940, 81 y.o.   MRN: [id number] ? ?Upon entering room NT observed patient had removed coretrack. MD notified. Patient will be restarted on fluids and remain NPO until 4/21. ?Nares are clean dry and intact.  ? ?Haydee Salter, RN ?  ?

## 2021-05-25 NOTE — Progress Notes (Signed)
? ? ? ?  Correction: ?Plan for perc G tube in Radiology Friday 4/21--- not 4/22 ? ?Thx ? ?

## 2021-05-26 ENCOUNTER — Ambulatory Visit: Payer: No Typology Code available for payment source | Admitting: Adult Health

## 2021-05-26 ENCOUNTER — Inpatient Hospital Stay (HOSPITAL_COMMUNITY): Payer: No Typology Code available for payment source

## 2021-05-26 ENCOUNTER — Encounter (HOSPITAL_COMMUNITY): Payer: Self-pay | Admitting: Internal Medicine

## 2021-05-26 DIAGNOSIS — S72001A Fracture of unspecified part of neck of right femur, initial encounter for closed fracture: Secondary | ICD-10-CM | POA: Diagnosis not present

## 2021-05-26 LAB — AEROBIC/ANAEROBIC CULTURE W GRAM STAIN (SURGICAL/DEEP WOUND)
Culture: NO GROWTH
Culture: NO GROWTH
Culture: NO GROWTH
Gram Stain: NONE SEEN
Gram Stain: NONE SEEN
Gram Stain: NONE SEEN

## 2021-05-26 LAB — BASIC METABOLIC PANEL
Anion gap: 6 (ref 5–15)
BUN: 27 mg/dL — ABNORMAL HIGH (ref 8–23)
CO2: 26 mmol/L (ref 22–32)
Calcium: 8.2 mg/dL — ABNORMAL LOW (ref 8.9–10.3)
Chloride: 112 mmol/L — ABNORMAL HIGH (ref 98–111)
Creatinine, Ser: 0.97 mg/dL (ref 0.61–1.24)
GFR, Estimated: >60 mL/min (ref >60)
Glucose, Bld: 119 mg/dL — ABNORMAL HIGH (ref 70–99)
Potassium: 2.7 mmol/L — CL (ref 3.5–5.1)
Sodium: 144 mmol/L (ref 135–145)

## 2021-05-26 LAB — GLUCOSE, CAPILLARY
Glucose-Capillary: 107 mg/dL — ABNORMAL HIGH (ref 70–99)
Glucose-Capillary: 111 mg/dL — ABNORMAL HIGH (ref 70–99)
Glucose-Capillary: 114 mg/dL — ABNORMAL HIGH (ref 70–99)
Glucose-Capillary: 116 mg/dL — ABNORMAL HIGH (ref 70–99)
Glucose-Capillary: 127 mg/dL — ABNORMAL HIGH (ref 70–99)
Glucose-Capillary: 97 mg/dL (ref 70–99)

## 2021-05-26 LAB — PHOSPHORUS: Phosphorus: 1.7 mg/dL — ABNORMAL LOW (ref 2.5–4.6)

## 2021-05-26 LAB — MAGNESIUM: Magnesium: 1.9 mg/dL (ref 1.7–2.4)

## 2021-05-26 MED ORDER — OSMOLITE 1.5 CAL PO LIQD: 1000.0000 mL | ORAL | Status: DC

## 2021-05-26 MED ORDER — FREE WATER: 160.0000 mL | Status: DC

## 2021-05-26 MED ORDER — PROSOURCE TF PO LIQD: 45.0000 mL | Freq: Two times a day (BID) | ORAL | Status: DC

## 2021-05-26 MED ORDER — DEXTROSE 5 % IV SOLN
30.0000 mmol | Freq: Once | INTRAVENOUS | Status: DC
Start: 2021-05-26 — End: 2021-05-26
  Filled 2021-05-26: qty 10

## 2021-05-26 MED ORDER — POTASSIUM CHLORIDE 10 MEQ/100ML IV SOLN
10.0000 meq | INTRAVENOUS | Status: AC
  Administered 2021-05-26 (×4): 10 meq via INTRAVENOUS
  Filled 2021-05-26 (×4): qty 100

## 2021-05-26 MED ORDER — POTASSIUM PHOSPHATES 15 MMOLE/5ML IV SOLN
30.0000 mmol | Freq: Once | INTRAVENOUS | Status: AC
  Administered 2021-05-26: 30 mmol via INTRAVENOUS

## 2021-05-26 NOTE — Progress Notes (Signed)
Speech Language Pathology Treatment: Dysphagia  ?Patient Details ?Name: William Kirk ?MRN: [id number] ?DOB: [birth year] ?Today's Date: 05/26/2021 ?Time: 8882-8003 ?SLP Time Calculation (min) (ACUTE ONLY): 22 min ? ?Assessment / Plan / Recommendation ?Clinical Impression ? Mr. Guay participated in therapy for dysphagia with wife at bedside with EMT (expiratory muscle training) with [facility] [employer]ite respiratory trainer. Pt and wife are familiar with EMT from history of dysphagia and ST services as outpatient approximately one year ago. He needed mod verbal cues initially fading to min for proper use as he was inhaling with device versus exhaling. Pt performed 3 sets of 5 repetition and 3 sets of 10 with adjustments made eventually most optimal setting was around 10 cm H20 pressure after reporting exertion rate of 7-8/10. Encouraged/advised to use device minimum of 2 times a day for 3 sets of 10 repetition. Wife also reported pt performed other exercises at outpatient and described effortful swallow, Masako, Shaker, pitch variation and encouraged to perform these as well.  ?Plan is for pt to receive PEG on Saturday after stopping blood thinner. ST will continue to treat for dysphagia.  ?  ?HPI HPI: William Kirk is a 81 y.o. male with medical history significant of PVD, COPD, hyperlipidemia, CKD 3A who presents to the emergency department via EMS from home after sustaining a mechanical fall PTA.  Patient tripped while going up the steps at home, he fell backwards landing on his right hip, he denies hitting his head and denies LOC.  He complained of right hip pain and difficulty in being able to bear weight on affected leg, EMS was activated and fentanyl was given prior to arrival.  He denies fever, chills, headache, blurry vision, chest pain, shortness of breath, abdominal pain.  CHest xray is showing patchy opacities in the right upper and lower lung zones that appears unchanged from exam yesterday that was  concerning for pneumonia. ?  ?   ?SLP Plan ? Continue with current plan of care ? ?  ?  ?Recommendations for follow up therapy are one component of a multi-disciplinary discharge planning process, led by the attending physician.  Recommendations may be updated based on patient status, additional functional criteria and insurance authorization. ?  ? ?Recommendations  ?Diet recommendations: NPO ?Medication Administration: Via alternative means  ?   ?    ?   ? ? ? ? Oral Care Recommendations: Oral care BID ?Follow Up Recommendations: Home health SLP ?Assistance recommended at discharge: Intermittent Supervision/Assistance ?SLP Visit Diagnosis: Dysphagia, pharyngeal phase (R13.13) ?Plan: Continue with current plan of care ? ? ? ? ?  ?  ? ? ?Houston Siren ? ?05/26/2021, 1:48 PM ?

## 2021-05-26 NOTE — Procedures (Signed)
Cortrak ? ?Person Inserting Tube:  Marveen Reeks, RD ?Tube Type:  Cortrak - 43 inches ?Tube Size:  10 ?Tube Location:  Left nare ?Secured by: Dorann Lodge ?Technique Used to Measure Tube Placement:  Marking at nare/corner of mouth ?Cortrak Secured At:  70 cm ? ?Cortrak Tube Team Note: ? ?Consult received to place a Cortrak feeding tube.  ? ?X-ray is required, abdominal x-ray has been ordered by the Cortrak team. Please confirm tube placement before using the Cortrak tube.  ? ?If the tube becomes dislodged please keep the tube and contact the Cortrak team at [email or web address] (password [email or web address]) for replacement.  ?If after hours and replacement cannot be delayed, place a NG tube and confirm placement with an abdominal x-ray.  ? ? ?Hermina Barters RD, LDN ?Clinical Dietitian ?See AMiON for contact information.  ? ? ?

## 2021-05-26 NOTE — Progress Notes (Signed)
?PROGRESS NOTE ? ?William Kirk  ?DOB: Jun 22, 1940  ?PCP: Floreen Comber, PA-C ?ZOX:[id number]  ?DOA: 05/19/2021 ? LOS: 7 days  ?Hospital Day: 8 ? ?Brief narrative: ?William Kirk is a 81 y.o. male with PMH significant for HTN, PAD, CKD 3, COPD, dysphagia, bronchiectasis, right lower lobe lobectomy in 2020  due to nonfunctional RLL from multiple aspiration pneumonia. ?Patient presented to the ED on 4/12 after he tripped while going up the steps at home. ?He was found to have right periprosthetic hip fracture, s/p ORIF on 4/14. ?  ?Preoperatively he had desaturations into the 80s on the floor, and was found to be in the 60s on preop.  He was intubated preoperatively due to hypoxia.  He had a prolonged surgery about 4hrs but was able to be extubated in the PACU.  He had prolonged time to wake up.  ABG with mild acute hypercapnia.  He was transferred to icu post op due to  altered mental status, hypoxia, hypercapnia. He required vasopressors briefly , weaned off levophed on 4/15 am. He also required NRB while in the ICU, now on [facility].  He was transferred to [facility] service on 4/16. ?Currently remains altered and is on restraints. ? ?Subjective: ?Patient was seen and examined this morning.   ?Alert, awake, clearly confused.  Not restless or agitated.  On wrist restraints. ?Patient removed core track yesterday. ? ? Principal Problem: ?  Right femoral fracture (HCC) ?Active Problems: ?  Depression ?  Chronic obstructive pulmonary disease (HCC) ?  AKI (acute kidney injury) (HCC) ?  Status post lobectomy of lung ?  Pressure injury of skin ?  Dehydration ?  Leukocytosis ?  Mixed hyperlipidemia ?  Chronic kidney disease, stage 3a (HCC) ?  Acute on chronic respiratory failure with hypoxia and hypercapnia (HCC) ?  Respiratory acidosis ?  ?Assessment and Plan: ?Acute Metabolic Encephalopathy  ?-Patient remains disoriented, restless and is requiring restraints.   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   ?-Continue frequent reorientation.   ?-Currently on Zoloft 200 mg daily, Seroquel 25 mg twice daily and hydroxyzine 10 mg 3 times daily as needed. ?-Mental status is slightly improved after he was able to get his medications through core track tube.  However patient pulled out yesterday and is currently not getting these medicines.  His mental status probably will continue to worsen without meds.  I will request another core track placement.  He is scheduled for PEG tube placement 4/21. ?-Continue as needed Haldol.  QTc less than 400 ms. ? ?Right Periprosthetic Hip Fracture ?-Secondary to mechanical fall  ?-4/14, s/p revision THA for periprosthetic femur fracture ?-Per orthopedics, 20% partial weightbearing on the right lower extremity, posterior hip precautions for 6 weeks.  DVT prophylaxis with Lovenox 40 mg daily.  Currently not on any scheduled pain medicines.  Add Tylenol scheduled. ? ?Post op foot drop ?-PRAFO boot in place. ?-post operative care per Ortho, details please see ortho note on 4/16 ? ?Septic shock secondary to aspiration pneumonia - not POA ?Acute hypoxic hypercapnic respiratory failure ?-Was on pressors briefly in the ICU, now off pressors, blood pressure stable  ?-414, CXR with right-sided pneumonia/atelectasis ?-Intubated preoperatively, was able to extubate postop, briefly required NRB, now on [place] 3 L ?-Currently on oral Augmentin. ? ?Chronic dysphagia ?Chronic aspiration ?h/o bronchiectasis and RLL lobectomy ?-Aspiration precautions ?-speech eval obtained.  Patient was noted to have significant gross aspiration with  any kind of food.  N.p.o. was recommended.  It seems patient had PEG tube in the past in 2022 which was eventually removed. ?  ?COPD w/o Exacerbation,  ?-Continue home Incruse and Dulera ?    ?Acute on chronic anemia ?-Baseline hemoglobin more than 11. ?-This hospitalization, hemoglobin is running close  to 7.  Lowest was 6.5 for which 1 unit of PRBC was given.  Hemoglobin 8 this morning.  Continue to monitor. ?-Transfuse if less than 7. ?-Continue Lovenox and Plavix. ?Recent Labs  ?  05/22/21 ?1603 05/22/21 ?1759 05/23/21 ?0124 05/24/21 ?0243 05/25/21 ?0428  ?HGB 6.7* 6.5* 7.1* 7.2* 8.0*  ?MCV 86.4  --  85.4 86.5 85.5  ?WGNFAOZH08  --   --   --  727  --   ?FOLATE  --   --   --   --  14.8  ?FERRITIN  --   --   --   --  900*  ?TIBC  --   --   --   --  147*  ?IRON  --   --   --   --  20*  ?RETICCTPCT  --   --   --   --  1.9  ? ? ?AKI on CKD IIIa  ?-Baseline creatinine less than 1.5.  Creatinine was elevated to peak of 1.95.  Improving now.  Continue to monitor. ?Recent Labs  ?  02/25/21 ?0206 03/05/21 ?6578 05/19/21 ?1645 05/20/21 ?0428 05/21/21 ?0303 05/22/21 ?0522 05/23/21 ?0124 05/24/21 ?0243 05/25/21 ?1056 05/26/21 ?4696  ?BUN 26* 20 25* 27* 36* 37* 33* 34* 34* 27*  ?CREATININE 1.43* 1.30* 1.40* 1.47* 1.95* 1.49* 1.19 1.37* 0.88 0.97  ? ? ?PVD/HLD ?PAD with intermittent claudication on the left LE and asymptomatic carotid stenosis  ?S/p RIGHT TRANSCAROTID ARTERY REVASCULARIZATION 02/2021 ?-Because of low hemoglobin, Plavix was held on 4/14. ?-4/17, resume Plavix.  Continue aspirin and statin ?  ?Goals of care ?  Code Status: Full Code  ? ? ?Mobility: Needs continued PT follow-up unable to follow commands ? ?Skin assessment:  ?Pressure Injury Heel Posterior;Right Stage 1 -  Intact skin with non-blanchable redness of a localized area usually over a bony prominence. (Active)  ?   ?Location: Heel  ?Location Orientation: Posterior;Right  ?Staging: Stage 1 -  Intact skin with non-blanchable redness of a localized area usually over a bony prominence.  ?Wound Description (Comments):   ?Present on Admission: Yes  ?   ?Pressure Injury 05/22/21 Toe (Comment  which one) Anterior;Left scabed over (Active)  ?05/22/21 0700  ?Location: Toe (Comment  which one) (5th toe)  ?Location Orientation: Anterior;Left  ?Staging:   ?Wound  Description (Comments): scabed over  ?Present on Admission:   ? ? ?Nutritional status:  ?Body mass index is 23.06 kg/m?Marland Kitchen  ?Nutrition Problem: Severe Malnutrition ?Etiology: chronic illness (COPD) ?Signs/Symptoms: severe fat depletion, severe muscle depletion ? ? ? ? ?Diet:  ?Diet Order   ? ?       ?  Diet NPO time specified Except for: Ice Chips  Diet effective now       ?  ? ?  ?  ? ?  ? ? ?DVT prophylaxis:  ?enoxaparin (LOVENOX) injection 40 mg Start: 05/22/21 1000 ?SCDs Start: 05/20/21 0011 ?  ?Antimicrobials: Augmentin ?Fluid: Currently on D5 half NS at 75 mill per hour.   ?Consultants: Orthopedics ?Family Communication: Called and updated patient's wife on 4/17.  She confirmed that he had PEG tube in 2022 and that was removed after his swelling  improved in few months.  She wants another PEG tube put in.  IR to put in a PEG tube on 4/21 ? ?Status is: Inpatient ? ?Continue in-hospital care because: Remains altered ?Level of care: Med-Surg  ? ?Dispo: The patient is from: Home ?             Anticipated d/c is to: SNF ?             Patient currently is not medically stable to d/c. ?  Difficult to place patient No ? ? ? ? ?Infusions:  ? sodium chloride    ? dextrose 5 % and 0.45% NaCl 75 mL/hr at 05/25/21 1610  ? feeding supplement (OSMOLITE 1.5 CAL) 60 mL/hr at 05/25/21 0256  ? potassium chloride 10 mEq (05/26/21 1322)  ? potassium PHOSPHATE IVPB (in mmol)    ? ? ?Scheduled Meds: ? sodium chloride   Intravenous Once  ? acetaminophen  1,000 mg Oral Q8H  ? amoxicillin-clavulanate  1 tablet Oral Q12H  ? atorvastatin  80 mg Oral QHS  ? chlorhexidine  15 mL Mouth Rinse BID  ? Chlorhexidine Gluconate Cloth  6 each Topical Q0600  ? enoxaparin  40 mg Subcutaneous Q24H  ? feeding supplement (PROSource TF)  45 mL Per Tube BID  ? free water  160 mL Per Tube Q4H  ? mouth rinse  15 mL Mouth Rinse q12n4p  ? mometasone-formoterol  2 puff Inhalation BID  ? QUEtiapine  25 mg Oral BID  ? sertraline  200 mg Oral Daily  ? umeclidinium  bromide  1 puff Inhalation q AM  ? ? ?PRN meds: ?hydrALAZINE, hydrOXYzine, ipratropium-albuterol, LORazepam  ? ?Antimicrobials: ?Anti-infectives (From admission, onward)  ? ? Start     Dose/Rate Route [place]

## 2021-05-26 NOTE — Progress Notes (Addendum)
? ? ? ?  Subjective: ?Patient continues to have confusion and delirium.  He has been unable to participate with physical therapy because of this.  He is resting comfortably in bed but in restraints.  Not participating with exam this morning but spontaneously wiggling toes on lower extremities. ? ?Objective:  ? ?VITALS:   ?Vitals:  ? 05/25/21 0853 05/25/21 1938 05/25/21 2022 05/25/21 2024  ?BP:  (!) 149/71    ?Pulse:  85    ?Resp:  16    ?Temp:  98.3 ?F (36.8 ?C)    ?TempSrc:  Oral    ?SpO2: 94% (!) 88% (!) 88% 95%  ?Weight:      ?Height:      ? ? ?Sensation intact distally to the plantar and dorsal aspects of the foot ?Intact pulses distally ?Plantar flexion intact, but unable to demonstrate active ankle dorsiflexion or EHL function ?Skin checked under prafo, skin intact no sores ?Incision: prevena dressing holding suction, small amount of bloody drainage in canister less than 25 cc ?Compartment soft ? ? ?Lab Results  ?Component Value Date  ? WBC 11.6 (H) 05/25/2021  ? HGB 8.0 (L) 05/25/2021  ? HCT 24.2 (L) 05/25/2021  ? MCV 85.5 05/25/2021  ? PLT 243 05/25/2021  ? ?BMET ?   ?Component Value Date/Time  ? NA 144 05/25/2021 1056  ? K 2.9 (L) 05/25/2021 1056  ? CL 112 (H) 05/25/2021 1056  ? CO2 26 05/25/2021 1056  ? GLUCOSE 127 (H) 05/25/2021 1056  ? BUN 34 (H) 05/25/2021 1056  ? CREATININE 0.88 05/25/2021 1056  ? CALCIUM 8.3 (L) 05/25/2021 1056  ? GFRNONAA >60 05/25/2021 1056  ? ? ?Xray: Postop x-rays demonstrate excellent reduction of the periprosthetic fracture good alignment of hardware no adverse features. ? ?Assessment/Plan: ?5 Days Post-Op  ? ?Principal Problem: ?  Right femoral fracture ([place]) ?Active Problems: ?  Depression ?  Chronic obstructive pulmonary disease (HCC) ?  AKI (acute kidney injury) ([place]) ?  Status post lobectomy of lung ?  Pressure injury of skin ?  Dehydration ?  Leukocytosis ?  Mixed hyperlipidemia ?  Chronic kidney disease, stage 3a ([place]) ?  Acute on chronic respiratory failure with hypoxia  and hypercapnia (HCC) ?  Respiratory acidosis ? ?S/p revision THA for periprosthetic femur fracture 4/14 ? ?Post op foot drop: AFO ordered, likely intraop stretch injury, no significant swelling in the buttock area and not having much pain so low concern for nerve laceration.  patient endorses intact sensation to dorsal foot, but given his altered mental status may not be a reliable exam. Will continue to monitor. ? ?Post op recs: ?WB: 20% PWB RLE, posterior hip precautions x6 weeks ?Abx: Currently on Augmentin for pneumonia ?PT for mobilization ?Imaging: PACU pelvis/femur Xray ?Dressing: prevena incisional wound vac to removed in clinic 1 week post op discharge or 2 weeks post op ?DVT prophylaxis: lovenox '40mg'$  daily x 4 weeks ?Follow up: 1 week post discharge for a wound check with Dr. Zachery Dakins at [facility].  ?Address: [address] [place], [place], [place] [address]  ?Office Phone: [phone] ? ? ? ?Nashley Cordoba A Tracie Dore ?05/26/2021, 7:35 AM ? ? ?Charlies Constable, MD ? ?Contact information:   ?Weekdays 7am-5pm epic message Dr. Zachery Dakins, or call office for patient follow up: [phone] ?After hours and holidays please check [email or web address] for group call information for [facility] ? ?  ?

## 2021-05-26 NOTE — Plan of Care (Signed)
?  Problem: Education: ?Goal: Knowledge of General Education information will improve ?Description: Including pain rating scale, medication(s)/side effects and non-pharmacologic comfort measures ?05/26/2021 2311 by Valora Corporal, RN ?Outcome: Not Progressing ?05/26/2021 2311 by Valora Corporal, RN ?Outcome: Not Progressing ?  ?Problem: Health Behavior/Discharge Planning: ?Goal: Ability to manage health-related needs will improve ?05/26/2021 2311 by Valora Corporal, RN ?Outcome: Not Progressing ?05/26/2021 2311 by Valora Corporal, RN ?Outcome: Not Progressing ?  ?Problem: Clinical Measurements: ?Goal: Ability to maintain clinical measurements within normal limits will improve ?05/26/2021 2311 by Valora Corporal, RN ?Outcome: Not Progressing ?05/26/2021 2311 by Valora Corporal, RN ?Outcome: Not Progressing ?Goal: Will remain free from infection ?05/26/2021 2311 by Valora Corporal, RN ?Outcome: Not Progressing ?05/26/2021 2311 by Valora Corporal, RN ?Outcome: Not Progressing ?Goal: Diagnostic test results will improve ?05/26/2021 2311 by Valora Corporal, RN ?Outcome: Not Progressing ?05/26/2021 2311 by Valora Corporal, RN ?Outcome: Not Progressing ?Goal: Respiratory complications will improve ?05/26/2021 2311 by Valora Corporal, RN ?Outcome: Not Progressing ?05/26/2021 2311 by Valora Corporal, RN ?Outcome: Not Progressing ?Goal: Cardiovascular complication will be avoided ?05/26/2021 2311 by Valora Corporal, RN ?Outcome: Not Progressing ?05/26/2021 2311 by Valora Corporal, RN ?Outcome: Not Progressing ?  ?Problem: Activity: ?Goal: Risk for activity intolerance will decrease ?05/26/2021 2311 by Valora Corporal, RN ?Outcome: Not Progressing ?05/26/2021 2311 by Valora Corporal, RN ?Outcome: Not Progressing ?  ?Problem: Nutrition: ?Goal: Adequate nutrition will be maintained ?05/26/2021 2311 by Valora Corporal, RN ?Outcome: Not Progressing ?05/26/2021 2311  by Valora Corporal, RN ?Outcome: Not Progressing ?  ?Problem: Coping: ?Goal: Level of anxiety will decrease ?05/26/2021 2311 by Valora Corporal, RN ?Outcome: Not Progressing ?05/26/2021 2311 by Valora Corporal, RN ?Outcome: Not Progressing ?  ?Problem: Elimination: ?Goal: Will not experience complications related to bowel motility ?05/26/2021 2311 by Valora Corporal, RN ?Outcome: Not Progressing ?05/26/2021 2311 by Valora Corporal, RN ?Outcome: Not Progressing ?Goal: Will not experience complications related to urinary retention ?05/26/2021 2311 by Valora Corporal, RN ?Outcome: Not Progressing ?05/26/2021 2311 by Valora Corporal, RN ?Outcome: Not Progressing ?  ?Problem: Pain Managment: ?Goal: General experience of comfort will improve ?05/26/2021 2311 by Valora Corporal, RN ?Outcome: Not Progressing ?05/26/2021 2311 by Valora Corporal, RN ?Outcome: Not Progressing ?  ?Problem: Safety: ?Goal: Ability to remain free from injury will improve ?05/26/2021 2311 by Valora Corporal, RN ?Outcome: Not Progressing ?05/26/2021 2311 by Valora Corporal, RN ?Outcome: Not Progressing ?  ?Problem: Skin Integrity: ?Goal: Risk for impaired skin integrity will decrease ?05/26/2021 2311 by Valora Corporal, RN ?Outcome: Not Progressing ?05/26/2021 2311 by Valora Corporal, RN ?Outcome: Not Progressing ?  ?Problem: Safety: ?Goal: Non-violent Restraint(s) ?05/26/2021 2311 by Valora Corporal, RN ?Outcome: Not Progressing ?05/26/2021 2311 by Valora Corporal, RN ?Outcome: Not Progressing ?  ?

## 2021-05-26 NOTE — Consult Note (Signed)
? ?Chief Complaint: ?Patient was seen in consultation today for dysphagia, encephalopathy ? ?Referring Physician(s): ?Dr. Marlowe Aschoff Dahal ? ?Supervising Physician: Arne Cleveland ? ?Patient Status: [facility] - In-pt ? ?History of Present Illness: ?William Kirk is a 81 y.o. male with past medical history of HTN, PAD, CKD 3, COPD, dysphagia, right lower lobectomy in 2020 due to multiple aspiration pneumonia episodes leading to RLL dysfunction. Patient presented to the ED 4/12 after a fall at home resulting in right hip fracture. He is s/p ORIF 9/50 complicated by septic shock secondary to aspiration pneumonia.  Since OR and ICU admission, he has had metabolic encephalopathy and remains disoriented and restless.  He was assessed by SLP 4/16 who felt patient at high aspiration risk.  An NGT was placed 4/17, however patient has pulled this out despite restraints.  IR now consulted for percutaneous gastrostomy tube placement.  ?Due to history of PAD and PTCA, he was on aspirin and Plavix.  This was held 4/16 in anticipation of possible gastrostomy tube placement. ?Imaging reviewed by  Dr. Serafina Royals who approves patient for attempt with barium administration the night prior.  ? ?Patient assessed at bedside.  He is confused.  Restless.  He has pulled his NGT and it is no longer in place.  ? ?Past Medical History:  ?Diagnosis Date  ? Anxiety   ? Arthritis   ? "hands" (09/21/2016)  ? Bleeding stomach ulcer 2000s  ? COPD (chronic obstructive pulmonary disease) ([place])   ? Cough   ? Depression   ? Dyspnea   ? Hypertension   ? Pneumonia   ? "now and several times before this" (09/21/2016)  ? PVD (peripheral vascular disease) ([place]) 2014  ? prev PTCA on RLE  ? ? ?Past Surgical History:  ?Procedure Laterality Date  ? ABDOMINAL AORTOGRAM W/LOWER EXTREMITY Bilateral 03/05/2021  ? Procedure: ABDOMINAL AORTOGRAM W/LOWER EXTREMITY;  Surgeon: Cherre Robins, MD;  Location: [facility];  Service: Cardiovascular;  Laterality: Bilateral;   ? IR ANGIOGRAM SELECTIVE EACH ADDITIONAL VESSEL  07/26/2018  ? IR ANGIOGRAM SELECTIVE EACH ADDITIONAL VESSEL  07/26/2018  ? IR ANGIOGRAM SELECTIVE EACH ADDITIONAL VESSEL  07/26/2018  ? IR EMBO ART  VEN HEMORR LYMPH EXTRAV  INC GUIDE ROADMAPPING  07/26/2018  ? IR GASTROSTOMY TUBE MOD SED  12/14/2018  ? IR US GUIDE VASC ACCESS RIGHT  07/26/2018  ? JOINT REPLACEMENT    ? KNEE ARTHROSCOPY Right X 1  ? PERIPHERAL VASCULAR BALLOON ANGIOPLASTY  03/05/2021  ? Procedure: PERIPHERAL VASCULAR BALLOON ANGIOPLASTY;  Surgeon: Cherre Robins, MD;  Location: [facility];  Service: Cardiovascular;;  DCB Popliteal  ? PERIPHERAL VASCULAR INTERVENTION  03/05/2021  ? Procedure: PERIPHERAL VASCULAR INTERVENTION;  Surgeon: Cherre Robins, MD;  Location: [facility];  Service: Cardiovascular;;  ? TOTAL HIP ARTHROPLASTY Right 2009  ? TRANSCAROTID ARTERY REVASCULARIZATION?  Right 02/24/2021  ? Procedure: RIGHT TRANSCAROTID ARTERY REVASCULARIZATION;  Surgeon: Cherre Robins, MD;  Location: [facility] OR;  Service: Vascular;  Laterality: Right;  ? ULTRASOUND GUIDANCE FOR VASCULAR ACCESS Left 02/24/2021  ? Procedure: ULTRASOUND GUIDANCE FOR VASCULAR ACCESS;  Surgeon: Cherre Robins, MD;  Location: [facility];  Service: Vascular;  Laterality: Left;  ? VIDEO ASSISTED THORACOSCOPY (VATS)/ LOBECTOMY Right 10/18/2018  ? Procedure: VIDEO ASSISTED THORACOSCOPY (VATS)/ right lower LOBECTOMY;  Surgeon: Melrose Nakayama, MD;  Location: [facility];  Service: Thoracic;  Laterality: Right;  ? VIDEO ASSISTED THORACOSCOPY (VATS)/DECORTICATION Right 11/09/2018  ? Procedure: REDO VIDEO ASSISTED THORACOSCOPY (  VATS)/DECORTICATION/DRAIN EFFUSION;  Surgeon: Melrose Nakayama, MD;  Location: [facility];  Service: Thoracic;  Laterality: Right;  ? VIDEO BRONCHOSCOPY N/A 07/27/2018  ? Procedure: VIDEO BRONCHOSCOPY WITHOUT FLUORO;  Surgeon: Collene Gobble, MD;  Location: [facility];  Service: Cardiopulmonary;  Laterality: N/A;  ? VIDEO BRONCHOSCOPY N/A 11/09/2018  ? Procedure:  VIDEO BRONCHOSCOPY;  Surgeon: Melrose Nakayama, MD;  Location: [facility];  Service: Thoracic;  Laterality: N/A;  ? ? ?Allergies: ?Testosterone ? ?Medications: ?Prior to Admission medications   ?Medication Sig Start Date End Date Taking? Authorizing Provider  ?acetaminophen (TYLENOL) 500 MG tablet Take 500 mg by mouth every 8 (eight) hours as needed for moderate pain. 10/28/19  Yes [provider]  ?albuterol (PROVENTIL) (2.5 MG/3ML) 0.083% nebulizer solution Take 3 mLs (2.5 mg total) by nebulization every 6 (six) hours as needed for wheezing or shortness of breath. 05/07/21  Yes Parrett, Tammy S, NP  ?albuterol (VENTOLIN HFA) 108 (90 Base) MCG/ACT inhaler Inhale 2 puffs into the lungs every 4 (four) hours as needed for wheezing or shortness of breath. 07/11/18  Yes Hongalgi, Lenis Dickinson, MD  ?aspirin EC 81 MG tablet Take 81 mg by mouth daily. Swallow whole.   Yes [provider]  ?atorvastatin (LIPITOR) 80 MG tablet Take 1 tablet (80 mg total) by mouth at bedtime. 02/25/21  Yes Ulyses Amor, PA-C  ?benzonatate (TESSALON) 200 MG capsule Take 1 capsule (200 mg total) by mouth 3 (three) times daily as needed for cough. 02/17/21  Yes Mannam, Praveen, MD  ?Cholecalciferol (VITAMIN D) 50 MCG (2000 UT) tablet Take 2,000 Units by mouth daily.   Yes [provider]  ?clopidogrel (PLAVIX) 75 MG tablet Take 1 tablet (75 mg total) by mouth daily. 01/12/21  Yes Cherre Robins, MD  ?Dextromethorphan-guaiFENesin [facility] DM PO) Take 1 Dose by mouth at bedtime as needed (cough). liquid   Yes [provider]  ?diclofenac sodium (VOLTAREN) 1 % GEL Apply 2 g topically 4 (four) times daily as needed (pain).    Yes [provider]  ?fluticasone-salmeterol (WIXELA INHUB) 250-50 MCG/ACT AEPB Inhale 1 puff into the lungs in the morning and at bedtime. 05/07/21  Yes Parrett, Tammy S, NP  ?gabapentin (NEURONTIN) 800 MG tablet Take 1 tablet (800 mg total) by mouth 3 (three) times daily. ?Patient taking  differently: Take 800 mg by mouth 2 (two) times daily. 12/28/20  Yes Raulkar, Clide Deutscher, MD  ?guaiFENesin (MUCINEX) 600 MG 12 hr tablet Take 600 mg by mouth at bedtime as needed for cough.   Yes [provider]  ?lidocaine (LIDODERM) 5 % Place 1 patch onto the skin daily as needed (pain).    Yes [provider]  ?methocarbamol (ROBAXIN) 500 MG tablet Take 1 tablet (500 mg total) by mouth 2 (two) times daily as needed for muscle spasms. 04/16/20  Yes Jamse Arn, MD  ?metoprolol tartrate (LOPRESSOR) 25 MG tablet Take 12.5 mg by mouth daily.   Yes [provider]  ?Multiple Vitamins-Minerals (MULTIVITAMIN WITH MINERALS) tablet Take 1 tablet by mouth daily.   Yes [provider]  ?Propylene Glycol (SYSTANE COMPLETE) 0.6 % SOLN Place 1 drop into both eyes daily.   Yes [provider]  ?sertraline (ZOLOFT) 100 MG tablet Place 2 tablets (200 mg total) into feeding tube daily. ?Patient taking differently: Take 200 mg by mouth daily. 11/23/18  Yes [name], PA-C  ?Tiotropium Bromide Monohydrate (SPIRIVA RESPIMAT) 2.5 MCG/ACT AERS Inhale 2 puffs into the lungs  in the morning. 05/07/21  Yes Parrett, Tammy S, NP  ?traZODone (DESYREL) 50 MG tablet Place 1 tablet (50 mg total) into feeding tube at bedtime. ?Patient taking differently: Take 50 mg by mouth at bedtime. 11/22/18  Yes Conte, Tessa N, PA-C  ?albuterol (PROVENTIL) (2.5 MG/3ML) 0.083% nebulizer solution Take 2.5 mg by nebulization every 6 (six) hours as needed for wheezing or shortness of breath. ?Patient not taking: Reported on 05/20/2021    [provider]  ?albuterol (VENTOLIN HFA) 108 (90 Base) MCG/ACT inhaler 2 puffs every 4-6 hours as needed up to 4 times per day as rescue inhaler. ?Patient not taking: Reported on 05/20/2021 05/07/21   Parrett, Fonnie Mu, NP  ?fluticasone-salmeterol (ADVAIR) 250-50 MCG/ACT AEPB Inhale 1 puff into the lungs in the morning and at bedtime. ?Patient not taking: Reported on  05/20/2021    [provider]  ?levofloxacin (LEVAQUIN) 500 MG tablet Take 500 mg by mouth See admin instructions. Qd x 7 days ?Patient not taking: Reported on 05/20/2021    [provider]

## 2021-05-26 NOTE — TOC Progression Note (Signed)
Transition of Care (TOC) - Progression Note  ? ? ?Patient Details  ?Name: STANISLAW ACTON ?MRN: [id number] ?Date of Birth: [birth year] ? ?Transition of Care (TOC) CM/SW Contact  ?Joanne Chars, LCSW ?Phone Number: ?05/26/2021, 10:17 AM ? ?Clinical Narrative:   Pt pending procedure for feeding tube on Friday.  TOC will continue to follow.  Auth at SNF has not been started.   ? ? ? ?Expected Discharge Plan: [place] ?Barriers to Discharge: Continued Medical Work up, SNF Pending bed offer ? ?Expected Discharge Plan and Services ?Expected Discharge Plan: [place] ?In-house Referral: Clinical Social Work ?  ?Post Acute Care Choice: [place] ?Living arrangements for the past 2 months: [place] ?                ?  ?  ?  ?  ?  ?  ?  ?  ?  ?  ? ? ?Social Determinants of Health (SDOH) Interventions ?  ? ?Readmission Risk Interventions ? ?  02/25/2021  ?  2:25 PM 11/08/2018  ? 11:19 AM 10/23/2018  ?  8:44 AM  ?Readmission Risk Prevention Plan  ?Post Dischage Appt Complete    ?Medication Screening Complete    ?Transportation Screening Complete Complete Complete  ?PCP or Specialist Appt within 3-5 Days  Complete Complete  ?[place] or Home Care Consult  Complete Complete  ?Social Work Consult for [place] Planning/Counseling  Complete Complete  ?Palliative Care Screening  Not Applicable Not Applicable  ?Medication Review Press photographer)  Complete Complete  ? ? ?

## 2021-05-27 DIAGNOSIS — S72001A Fracture of unspecified part of neck of right femur, initial encounter for closed fracture: Secondary | ICD-10-CM | POA: Diagnosis not present

## 2021-05-27 LAB — BASIC METABOLIC PANEL
Anion gap: 6 (ref 5–15)
BUN: 21 mg/dL (ref 8–23)
CO2: 24 mmol/L (ref 22–32)
Calcium: 8 mg/dL — ABNORMAL LOW (ref 8.9–10.3)
Chloride: 112 mmol/L — ABNORMAL HIGH (ref 98–111)
Creatinine, Ser: 0.82 mg/dL (ref 0.61–1.24)
GFR, Estimated: >60 mL/min (ref >60)
Glucose, Bld: 99 mg/dL (ref 70–99)
Potassium: 3.4 mmol/L — ABNORMAL LOW (ref 3.5–5.1)
Sodium: 142 mmol/L (ref 135–145)

## 2021-05-27 LAB — CBC WITH DIFFERENTIAL/PLATELET
Abs Immature Granulocytes: 0.79 10*3/uL — ABNORMAL HIGH (ref 0.00–0.07)
Basophils Absolute: 0 10*3/uL (ref 0.0–0.1)
Basophils Relative: 0 %
Eosinophils Absolute: 0.9 10*3/uL — ABNORMAL HIGH (ref 0.0–0.5)
Eosinophils Relative: 7 %
HCT: 24.7 % — ABNORMAL LOW (ref 39.0–52.0)
Hemoglobin: 7.6 g/dL — ABNORMAL LOW (ref 13.0–17.0)
Immature Granulocytes: 6 %
Lymphocytes Relative: 11 %
Lymphs Abs: 1.3 10*3/uL (ref 0.7–4.0)
MCH: 27 pg (ref 26.0–34.0)
MCHC: 30.8 g/dL (ref 30.0–36.0)
MCV: 87.6 fL (ref 80.0–100.0)
Monocytes Absolute: 0.8 10*3/uL (ref 0.1–1.0)
Monocytes Relative: 6 %
Neutro Abs: 8.8 10*3/uL — ABNORMAL HIGH (ref 1.7–7.7)
Neutrophils Relative %: 70 %
Platelets: 284 10*3/uL (ref 150–400)
RBC: 2.82 MIL/uL — ABNORMAL LOW (ref 4.22–5.81)
RDW: 16.3 % — ABNORMAL HIGH (ref 11.5–15.5)
WBC: 12.6 10*3/uL — ABNORMAL HIGH (ref 4.0–10.5)
nRBC: 0 % (ref 0.0–0.2)

## 2021-05-27 LAB — GLUCOSE, CAPILLARY
Glucose-Capillary: 100 mg/dL — ABNORMAL HIGH (ref 70–99)
Glucose-Capillary: 109 mg/dL — ABNORMAL HIGH (ref 70–99)
Glucose-Capillary: 121 mg/dL — ABNORMAL HIGH (ref 70–99)
Glucose-Capillary: 125 mg/dL — ABNORMAL HIGH (ref 70–99)
Glucose-Capillary: 86 mg/dL (ref 70–99)
Glucose-Capillary: 98 mg/dL (ref 70–99)

## 2021-05-27 LAB — PHOSPHORUS: Phosphorus: 3.5 mg/dL (ref 2.5–4.6)

## 2021-05-27 LAB — MAGNESIUM: Magnesium: 1.8 mg/dL (ref 1.7–2.4)

## 2021-05-27 MED ORDER — DEXTROSE-NACL 5-0.9 % IV SOLN: INTRAVENOUS | Status: AC

## 2021-05-27 NOTE — Progress Notes (Signed)
Physical Therapy Treatment ?Patient Details ?Name: William Kirk ?MRN: [id number] ?DOB: 11/03/1940 ?Today's Date: 05/27/2021 ? ? ?History of Present Illness 81 y.o. male who presents to the emergency department via EMS from home after sustaining a mechanical fall PTA. periprosthetic right femoral fracture Patient tripped while going up the steps. 4/14 ORIF with replacing the femoral component of previous THA; post-op confusion with hypoxia; required vasopressors;   PMH significant of PVD, COPD, hyperlipidemia, CKD 3A ? ?  ?PT Comments  ? ? Pt received supine and agreeable to session with slow but steady progress. Assist pt in rolling R/L for cleanup. Pt able to come to sitting EOB with min assist with pt self mobilizing BLE and physical assist to elevate trunk. Pt able to come to standing with mod a to power up and steady on rise. Pt unable to understand to push from bed and pulls on RW with this PTA stabilizing. Pt able to maintain WB precautions in standing and maintain upright standing for ~3 mins before max fatigue. Restraints reapplied at end of session due to pt continuing to reach and pull at lines. Pt continues to benefit from skilled PT services to progress toward functional mobility goals.  ?  ?Recommendations for follow up therapy are one component of a multi-disciplinary discharge planning process, led by the attending physician.  Recommendations may be updated based on patient status, additional functional criteria and insurance authorization. ? ?Follow Up Recommendations ? Skilled nursing-short term rehab (<3 hours/day) ?  ?  ?Assistance Recommended at Discharge Frequent or constant Supervision/Assistance  ?Patient can return home with the following Two people to help with walking and/or transfers;Assistance with cooking/housework;Two people to help with bathing/dressing/bathroom;Direct supervision/assist for medications management;Direct supervision/assist for financial management;Assist for  transportation;Help with stairs or ramp for entrance ?  ?Equipment Recommendations ? Rolling walker (2 wheels);Wheelchair (measurements PT);Wheelchair cushion (measurements PT);BSC/3in1  ?  ?Recommendations for Other Services OT consult ? ? ?  ?Precautions / Restrictions Precautions ?Precautions: Posterior Hip;Fall ?Precaution Booklet Issued: No ?Precaution Comments: pt cannot verbalize precautions ?Restrictions ?Weight Bearing Restrictions: Yes ?RLE Weight Bearing: Partial weight bearing ?RLE Partial Weight Bearing Percentage or Pounds: 20  ?  ? ?Mobility ? Bed Mobility ?Overal bed mobility: Needs Assistance ?Bed Mobility: Rolling, Supine to Sit, Sit to Supine ?Rolling: Mod assist ?  ?Supine to sit: Min assist ?Sit to supine: Mod assist ?  ?General bed mobility comments: assis to roll for peri-care, min a to come to EOB, heavy step by step cueing, mod a to return BLE into bed and reposition ?  ? ?Transfers ?Overall transfer level: Needs assistance ?Equipment used: Rolling walker (2 wheels) ?Transfers: Sit to/from Stand ?Sit to Stand: Mod assist ?  ?  ?  ?  ?  ?General transfer comment: mod a to power up to stand, pt pulling on RW despite cues to push off bed, PTA anchoring RW. ?  ? ?Ambulation/Gait ?  ?  ?  ?  ?  ?  ?  ?  ? ? ?Stairs ?  ?  ?  ?  ?  ? ? ?Wheelchair Mobility ?  ? ?Modified Rankin (Stroke Patients Only) ?  ? ? ?  ?Balance Overall balance assessment: History of Falls, Needs assistance ?Sitting-balance support: No upper extremity supported, Feet supported ?Sitting balance-Leahy Scale: Fair ?  ?  ?Standing balance support: Bilateral upper extremity supported, Reliant on assistive device for balance ?Standing balance-Leahy Scale: Poor ?  ?  ?  ?  ?  ?  ?  ?  ?  ?  ?  ?  ?  ? ?  ?  Cognition Arousal/Alertness: Awake/alert ?Behavior During Therapy: Impulsive, Restless ?Overall Cognitive Status: No family/caregiver present to determine baseline cognitive functioning ?  ?  ?  ?  ?  ?  ?  ?  ?  ?  ?  ?  ?  ?  ?   ?  ?General Comments: pt unable to recall precautions, pleasant and chatty ?  ?  ? ?  ?Exercises   ? ?  ?General Comments   ?  ?  ? ?Pertinent Vitals/Pain Pain Assessment ?Pain Assessment: Faces ?Faces Pain Scale: Hurts little more ?Pain Location: RLE ?Pain Descriptors / Indicators: Moaning, Operative site guarding ?Pain Intervention(s): Limited activity within patient's tolerance, Monitored during session, Repositioned  ? ? ?Home Living Family/patient expects to be discharged to:: Skilled nursing facility ?Living Arrangements: Spouse/significant other ?  ?  ?  ?  ?  ?  ?  ?  ?   ?  ?Prior Function    ?  ?  ?   ? ?PT Goals (current goals can now be found in the care plan section) Acute Rehab PT Goals ?PT Goal Formulation: With patient ?Time For Goal Achievement: 06/05/21 ? ?  ?Frequency ? ? ? Min 3X/week ? ? ? ?  ?PT Plan Current plan remains appropriate  ? ? ?Co-evaluation   ?  ?  ?  ?  ? ?  ?AM-PAC PT "6 Clicks" Mobility   ?Outcome Measure ? Help needed turning from your back to your side while in a flat bed without using bedrails?: A Lot ?Help needed moving from lying on your back to sitting on the side of a flat bed without using bedrails?: A Lot ?Help needed moving to and from a bed to a chair (including a wheelchair)?: Total ?Help needed standing up from a chair using your arms (e.g., wheelchair or bedside chair)?: A Lot ?Help needed to walk in hospital room?: A Lot ?Help needed climbing 3-5 steps with a railing? : Total ?6 Click Score: 10 ? ?  ?End of Session Equipment Utilized During Treatment: Oxygen ?Activity Tolerance: Patient tolerated treatment well;Treatment limited secondary to medical complications (Comment) ?Patient left: in bed;with call bell/phone within reach;with bed alarm set;with restraints reapplied ?Nurse Communication: Mobility status ?PT Visit Diagnosis: Other abnormalities of gait and mobility (R26.89);History of falling (Z91.81) ?  ? ? ?Time: 8416-6063 ?PT Time Calculation (min) (ACUTE  ONLY): 39 min ? ?Charges:  $Gait Training: 8-22 mins ?$Therapeutic Activity: 23-37 mins          ?          ? ?Lenora Boys. PTA ?Acute Rehabilitation Services ?Office: [phone] ? ? ? ?William Kirk ?05/27/2021, 1:41 PM ? ?

## 2021-05-27 NOTE — Progress Notes (Addendum)
? ? ? ?  Subjective: ?Patient more alert today and answering questions. Lying comfortably in bed. Denies having any pain. Plan for G tube placement tomorrow. Was able to participate with PT today and stand on the side of the bed for a few minutes before getting tired. ? ?Objective:  ? ?VITALS:   ?Vitals:  ? 05/27/21 0352 05/27/21 0800 05/27/21 0845 05/27/21 1404  ?BP: (!) 148/57 (!) 145/60  (!) 139/56  ?Pulse: 73 76  84  ?Resp: '20 18  18  '$ ?Temp: 97.9 ?F (36.6 ?C) 97.7 ?F (36.5 ?C)  98.4 ?F (36.9 ?C)  ?TempSrc: Axillary Oral    ?SpO2: 93% 100% 100% 100%  ?Weight:  76.4 kg    ?Height:      ? ? ?Sensation intact distally to the plantar and dorsal aspects of the foot ?Intact pulses distally ?Plantar flexion intact, but unable to demonstrate active ankle dorsiflexion or EHL function ?Skin checked under prafo, skin intact no sores ?Incision: prevena dressing holding suction, small amount of bloody drainage in canister less than 25 cc ?Compartment soft ? ? ?Lab Results  ?Component Value Date  ? WBC 12.6 (H) 05/27/2021  ? HGB 7.6 (L) 05/27/2021  ? HCT 24.7 (L) 05/27/2021  ? MCV 87.6 05/27/2021  ? PLT 284 05/27/2021  ? ?BMET ?   ?Component Value Date/Time  ? NA 142 05/27/2021 0841  ? K 3.4 (L) 05/27/2021 0841  ? CL 112 (H) 05/27/2021 0841  ? CO2 24 05/27/2021 0841  ? GLUCOSE 99 05/27/2021 0841  ? BUN 21 05/27/2021 0841  ? CREATININE 0.82 05/27/2021 0841  ? CALCIUM 8.0 (L) 05/27/2021 0841  ? GFRNONAA >60 05/27/2021 0841  ? ? ?Xray: Postop x-rays demonstrate excellent reduction of the periprosthetic fracture good alignment of hardware no adverse features. ? ?Assessment/Plan: ?6 Days Post-Op  ? ?Principal Problem: ?  Right femoral fracture ([place]) ?Active Problems: ?  Depression ?  Chronic obstructive pulmonary disease (HCC) ?  AKI (acute kidney injury) ([place]) ?  Status post lobectomy of lung ?  Pressure injury of skin ?  Dehydration ?  Leukocytosis ?  Mixed hyperlipidemia ?  Chronic kidney disease, stage 3a ([place]) ?  Acute on  chronic respiratory failure with hypoxia and hypercapnia (HCC) ?  Respiratory acidosis ? ?S/p revision THA for periprosthetic femur fracture 4/14 ? ?Post op foot drop: AFO ordered, likely intraop stretch injury, no significant swelling in the buttock area and not having much pain so low concern for nerve laceration.  patient endorses intact sensation to dorsal foot, but given his altered mental status may not be a reliable exam. Will continue to monitor. ? ?Post op recs: ?WB: 20% PWB RLE, posterior hip precautions x6 weeks ?Abx: Currently on Augmentin for pneumonia ?PT for mobilization ?Imaging: PACU pelvis/femur Xray ?Dressing: prevena incisional wound vac to removed in clinic 1 week post op discharge or 2 weeks post op ?DVT prophylaxis: lovenox '40mg'$  daily x 4 weeks ?Follow up: 1 week post discharge for a wound check with Dr. Zachery Dakins at [facility].  ?Address: [address] [place], [place], [place] [address]  ?Office Phone: [phone] ? ? ? ?Flora Ratz A Armanda Forand ?05/27/2021, 4:24 PM ? ? ?Charlies Constable, MD ? ?Contact information:   ?Weekdays 7am-5pm epic message Dr. Zachery Dakins, or call office for patient follow up: [phone] ?After hours and holidays please check [email or web address] for group call information for [facility] ? ?  ?

## 2021-05-27 NOTE — Progress Notes (Signed)
Interventional Radiology Brief Note: ? ?Ordered barium via NGT for possible gastrostomy tube placement tomorrow in IR. Discussed with RN.  ? ?Brynda Greathouse, MS RD PA-C ? ? ?

## 2021-05-27 NOTE — Plan of Care (Signed)

## 2021-05-27 NOTE — Progress Notes (Signed)
?PROGRESS NOTE ? ?William Kirk  ?DOB: Nov 16, 1940  ?PCP: Floreen Comber, PA-C ?ZOX:[id number]  ?DOA: 05/19/2021 ? LOS: 8 days  ?Hospital Day: 9 ? ?Brief narrative: ?William Kirk is a 81 y.o. male with PMH significant for HTN, PAD, CKD 3, COPD, dysphagia, bronchiectasis, right lower lobe lobectomy in 2020  due to nonfunctional RLL from multiple aspiration pneumonia. ?Patient presented to the ED on 4/12 after he tripped while going up the steps at home. ?He was found to have right periprosthetic hip fracture, s/p ORIF on 4/14. ?  ?Preoperatively he had desaturations into the 80s on the floor, and was found to be in the 60s on preop.  He was intubated preoperatively due to hypoxia.  He had a prolonged surgery about 4hrs but was able to be extubated in the PACU.  He had prolonged time to wake up.  ABG with mild acute hypercapnia.  He was transferred to icu post op due to  altered mental status, hypoxia, hypercapnia. He required vasopressors briefly , weaned off levophed on 4/15 am. He also required NRB while in the ICU, now on [facility].  He was transferred to [facility] service on 4/16. ?Currently remains altered and is on restraints. ? ?Subjective: ?Patient was seen and examined this morning.   ?Has bilateral wrist restraints.  Alert, awake, confused.  Not restless or agitated at the time of my evaluation.  Has NG tube feeding going. ? ? Principal Problem: ?  Right femoral fracture (HCC) ?Active Problems: ?  Depression ?  Chronic obstructive pulmonary disease (HCC) ?  AKI (acute kidney injury) (HCC) ?  Status post lobectomy of lung ?  Pressure injury of skin ?  Dehydration ?  Leukocytosis ?  Mixed hyperlipidemia ?  Chronic kidney disease, stage 3a (HCC) ?  Acute on chronic respiratory failure with hypoxia and hypercapnia (HCC) ?  Respiratory acidosis ?  ?Assessment and Plan: ?Acute Metabolic Encephalopathy  ?-Patient remains disoriented, restless and is requiring restraints.   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   ?-Continue frequent reorientation.   ?-Currently on Zoloft 200 mg daily, Seroquel 25 mg twice daily and hydroxyzine 10 mg 3 times daily as needed. ?-Hope patient does not pull out NG tube again.  If he is unable to be given Zoloft or Seroquel, he tends to be more restless and agitated. He is scheduled for PEG tube placement 4/21. ?-Continue as needed Haldol.  QTc less than 400 ms. ? ?Right Periprosthetic Hip Fracture ?-Secondary to mechanical fall  ?-4/14, s/p revision THA for periprosthetic femur fracture ?-Per orthopedics, 20% partial weightbearing on the right lower extremity, posterior hip precautions for 6 weeks.  DVT prophylaxis with Lovenox 40 mg daily.  Currently not on any scheduled pain medicines.  Add Tylenol scheduled. ? ?Post op foot drop ?-PRAFO boot in place. ?-post operative care per Ortho, details please see ortho note on 4/16 ? ?Septic shock secondary to aspiration pneumonia - not POA ?Acute hypoxic hypercapnic respiratory failure ?-Was on pressors briefly in the ICU, now off pressors, blood pressure stable  ?-414, CXR with right-sided pneumonia/atelectasis ?-Intubated preoperatively, was able to extubate postop, briefly required NRB, now on [place] 3 L ?-Currently on oral Augmentin. ? ?Chronic dysphagia ?Chronic aspiration ?h/o bronchiectasis and RLL lobectomy ?-Aspiration precautions ?-speech eval obtained.  Patient was noted to have significant gross aspiration with any kind of food.  N.p.o. was recommended.  It seems patient had PEG tube  in the past in 2022 which was eventually removed.  Currently has NG tube feeding.  Plan for PEG tube placement tomorrow. ?  ?COPD w/o Exacerbation,  ?-Continue home Incruse and Dulera ?    ?Acute on chronic anemia ?-Baseline hemoglobin more than 11. ?-This hospitalization, hemoglobin is running close to 7.  Lowest was 6.5 for which 1 unit of PRBC was given.   Hemoglobin 8 this morning.  Continue to monitor. ?-Transfuse if less than 7. ?-Continue Lovenox and Plavix. ?Recent Labs  ?  05/22/21 ?1759 05/23/21 ?0124 05/24/21 ?0243 05/25/21 ?0428 05/27/21 ?1610  ?HGB 6.5* 7.1* 7.2* 8.0* 7.6*  ?MCV  --  85.4 86.5 85.5 87.6  ?[id number]  --   --  727  --   --   ?FOLATE  --   --   --  14.8  --   ?FERRITIN  --   --   --  900*  --   ?TIBC  --   --   --  147*  --   ?IRON  --   --   --  20*  --   ?RETICCTPCT  --   --   --  1.9  --   ? ? ?AKI on CKD IIIa  ?-Baseline creatinine less than 1.5.  Creatinine was elevated to peak of 1.95.  Improving now.  Continue to monitor. ?Recent Labs  ?  03/05/21 ?1914 05/19/21 ?1645 05/20/21 ?0428 05/21/21 ?0303 05/22/21 ?0522 05/23/21 ?0124 05/24/21 ?0243 05/25/21 ?1056 05/26/21 ?7829 05/27/21 ?5621  ?BUN 20 25* 27* 36* 37* 33* 34* 34* 27* 21  ?CREATININE 1.30* 1.40* 1.47* 1.95* 1.49* 1.19 1.37* 0.88 0.97 0.82  ? ? ?PVD/HLD ?PAD with intermittent claudication on the left LE and asymptomatic carotid stenosis  ?S/p RIGHT TRANSCAROTID ARTERY REVASCULARIZATION 02/2021 ?-Because of low hemoglobin, Plavix was held on 4/14. ?-4/17, resume Plavix.  Continue aspirin and statin ?  ?Goals of care ?  Code Status: Full Code  ? ? ?Mobility: Needs continued PT follow-up unable to follow commands ? ?Skin assessment:  ?Pressure Injury Heel Posterior;Right Stage 1 -  Intact skin with non-blanchable redness of a localized area usually over a bony prominence. (Active)  ?   ?Location: Heel  ?Location Orientation: Posterior;Right  ?Staging: Stage 1 -  Intact skin with non-blanchable redness of a localized area usually over a bony prominence.  ?Wound Description (Comments):   ?Present on Admission: Yes  ?   ?Pressure Injury 05/22/21 Toe (Comment  which one) Anterior;Left scabed over (Active)  ?05/22/21 0700  ?Location: Toe (Comment  which one) (5th toe)  ?Location Orientation: Anterior;Left  ?Staging:   ?Wound Description (Comments): scabed over  ?Present on Admission:    ? ? ?Nutritional status:  ?Body mass index is 24.17 kg/m?Marland Kitchen  ?Nutrition Problem: Severe Malnutrition ?Etiology: chronic illness (COPD) ?Signs/Symptoms: severe fat depletion, severe muscle depletion ? ? ? ? ?Diet:  ?Diet Order   ? ?       ?  Diet NPO time specified Except for: Ice Chips  Diet effective now       ?  ? ?  ?  ? ?  ? ? ?DVT prophylaxis:  ?enoxaparin (LOVENOX) injection 40 mg Start: 05/22/21 1000 ?SCDs Start: 05/20/21 0011 ?  ?Antimicrobials: Augmentin ?Fluid: Can stop IV fluid. ?Consultants: Orthopedics ?Family Communication: Called and updated patient's wife on 4/17.  She confirmed that he had PEG tube in 2022 and that was removed after his swelling improved in few months.  She wants another PEG tube  put in.  IR to put in a PEG tube on 4/21. ? ?Status is: Inpatient ? ?Continue in-hospital care because: Remains altered ?Level of care: Med-Surg  ? ?Dispo: The patient is from: Home ?             Anticipated d/c is to: SNF ?             Patient currently is not medically stable to d/c. ?  Difficult to place patient No ? ? ? ? ?Infusions:  ? sodium chloride    ? feeding supplement (OSMOLITE 1.5 CAL) 1,000 mL (05/26/21 1742)  ? ? ?Scheduled Meds: ? sodium chloride   Intravenous Once  ? acetaminophen  1,000 mg Oral Q8H  ? amoxicillin-clavulanate  1 tablet Oral Q12H  ? atorvastatin  80 mg Oral QHS  ? chlorhexidine  15 mL Mouth Rinse BID  ? Chlorhexidine Gluconate Cloth  6 each Topical Q0600  ? enoxaparin  40 mg Subcutaneous Q24H  ? feeding supplement (PROSource TF)  45 mL Per Tube BID  ? free water  160 mL Per Tube Q4H  ? mouth rinse  15 mL Mouth Rinse q12n4p  ? mometasone-formoterol  2 puff Inhalation BID  ? QUEtiapine  25 mg Oral BID  ? sertraline  200 mg Oral Daily  ? umeclidinium bromide  1 puff Inhalation q AM  ? ? ?PRN meds: ?hydrALAZINE, hydrOXYzine, ipratropium-albuterol, LORazepam  ? ?Antimicrobials: ?Anti-infectives (From admission, onward)  ? ? Start     Dose/Rate Route Frequency Ordered Stop  ?  05/24/21 1200  amoxicillin-clavulanate (AUGMENTIN) 875-125 MG per tablet 1 tablet       ? 1 tablet Oral Every 12 hours 05/24/21 1101 05/27/21 2359  ? 05/24/21 1000  cefadroxil (DURICEF) capsule 500 mg  Status:  [place]

## 2021-05-28 ENCOUNTER — Inpatient Hospital Stay (HOSPITAL_COMMUNITY): Payer: No Typology Code available for payment source

## 2021-05-28 ENCOUNTER — Encounter (HOSPITAL_COMMUNITY): Payer: Self-pay | Admitting: Orthopedic Surgery

## 2021-05-28 DIAGNOSIS — S72001A Fracture of unspecified part of neck of right femur, initial encounter for closed fracture: Secondary | ICD-10-CM | POA: Diagnosis not present

## 2021-05-28 HISTORY — PX: IR GASTROSTOMY TUBE MOD SED: IMG625

## 2021-05-28 LAB — CBC WITH DIFFERENTIAL/PLATELET
Abs Immature Granulocytes: 0.79 10*3/uL — ABNORMAL HIGH (ref 0.00–0.07)
Basophils Absolute: 0.1 10*3/uL (ref 0.0–0.1)
Basophils Relative: 0 %
Eosinophils Absolute: 0.7 10*3/uL — ABNORMAL HIGH (ref 0.0–0.5)
Eosinophils Relative: 5 %
HCT: 26.7 % — ABNORMAL LOW (ref 39.0–52.0)
Hemoglobin: 8.4 g/dL — ABNORMAL LOW (ref 13.0–17.0)
Immature Granulocytes: 5 %
Lymphocytes Relative: 7 %
Lymphs Abs: 1.2 10*3/uL (ref 0.7–4.0)
MCH: 27.5 pg (ref 26.0–34.0)
MCHC: 31.5 g/dL (ref 30.0–36.0)
MCV: 87.5 fL (ref 80.0–100.0)
Monocytes Absolute: 0.8 10*3/uL (ref 0.1–1.0)
Monocytes Relative: 5 %
Neutro Abs: 12.4 10*3/uL — ABNORMAL HIGH (ref 1.7–7.7)
Neutrophils Relative %: 78 %
Platelets: 331 10*3/uL (ref 150–400)
RBC: 3.05 MIL/uL — ABNORMAL LOW (ref 4.22–5.81)
RDW: 16.5 % — ABNORMAL HIGH (ref 11.5–15.5)
WBC: 16 10*3/uL — ABNORMAL HIGH (ref 4.0–10.5)
nRBC: 0 % (ref 0.0–0.2)

## 2021-05-28 LAB — BASIC METABOLIC PANEL
Anion gap: 5 (ref 5–15)
BUN: 17 mg/dL (ref 8–23)
CO2: 25 mmol/L (ref 22–32)
Calcium: 8.3 mg/dL — ABNORMAL LOW (ref 8.9–10.3)
Chloride: 109 mmol/L (ref 98–111)
Creatinine, Ser: 0.87 mg/dL (ref 0.61–1.24)
GFR, Estimated: >60 mL/min (ref >60)
Glucose, Bld: 115 mg/dL — ABNORMAL HIGH (ref 70–99)
Potassium: 3.2 mmol/L — ABNORMAL LOW (ref 3.5–5.1)
Sodium: 139 mmol/L (ref 135–145)

## 2021-05-28 LAB — MAGNESIUM: Magnesium: 1.8 mg/dL (ref 1.7–2.4)

## 2021-05-28 LAB — GLUCOSE, CAPILLARY
Glucose-Capillary: 101 mg/dL — ABNORMAL HIGH (ref 70–99)
Glucose-Capillary: 106 mg/dL — ABNORMAL HIGH (ref 70–99)
Glucose-Capillary: 127 mg/dL — ABNORMAL HIGH (ref 70–99)
Glucose-Capillary: 86 mg/dL (ref 70–99)

## 2021-05-28 LAB — PHOSPHORUS: Phosphorus: 2.8 mg/dL (ref 2.5–4.6)

## 2021-05-28 MED ORDER — LIDOCAINE HCL 1 % IJ SOLN
INTRAMUSCULAR | Status: AC
  Filled 2021-05-28: qty 20

## 2021-05-28 MED ORDER — FENTANYL CITRATE (PF) 100 MCG/2ML IJ SOLN
INTRAMUSCULAR | Status: AC | PRN
Start: 2021-05-28 — End: 2021-05-28
  Administered 2021-05-28 (×2): 25 ug via INTRAVENOUS

## 2021-05-28 MED ORDER — MIDAZOLAM HCL 2 MG/2ML IJ SOLN
INTRAMUSCULAR | Status: AC | PRN
  Administered 2021-05-28 (×3): .5 mg via INTRAVENOUS

## 2021-05-28 MED ORDER — GLUCAGON HCL RDNA (DIAGNOSTIC) 1 MG IJ SOLR
INTRAMUSCULAR | Status: AC
  Filled 2021-05-28: qty 1

## 2021-05-28 MED ORDER — MIDAZOLAM HCL 2 MG/2ML IJ SOLN
INTRAMUSCULAR | Status: AC
  Filled 2021-05-28: qty 2

## 2021-05-28 MED ORDER — CEFAZOLIN SODIUM-DEXTROSE 2-4 GM/100ML-% IV SOLN
INTRAVENOUS | Status: AC
  Filled 2021-05-28: qty 100

## 2021-05-28 MED ORDER — CEFAZOLIN SODIUM-DEXTROSE 2-4 GM/100ML-% IV SOLN
INTRAVENOUS | Status: AC | PRN
  Administered 2021-05-28: 2 g via INTRAVENOUS

## 2021-05-28 MED ORDER — FENTANYL CITRATE (PF) 100 MCG/2ML IJ SOLN
INTRAMUSCULAR | Status: AC
  Filled 2021-05-28: qty 2

## 2021-05-28 MED ORDER — LIDOCAINE HCL 1 % IJ SOLN
INTRAMUSCULAR | Status: AC | PRN
  Administered 2021-05-28: 10 mL via INTRADERMAL

## 2021-05-28 MED ORDER — GLUCAGON HCL (RDNA) 1 MG IJ SOLR
INTRAMUSCULAR | Status: AC | PRN
  Administered 2021-05-28: .5 mg via INTRAVENOUS

## 2021-05-28 NOTE — Progress Notes (Signed)
?PROGRESS NOTE ? ?William Kirk  ?DOB: 24-Aug-1940  ?PCP: Floreen Comber, PA-C ?KGM:[id number]  ?DOA: 05/19/2021 ? LOS: 9 days  ?Hospital Day: 10 ? ?Brief narrative: ?William Kirk is a 81 y.o. male with PMH significant for HTN, PAD, CKD 3, COPD, dysphagia, bronchiectasis, right lower lobe lobectomy in 2020  due to nonfunctional RLL from multiple aspiration pneumonia. ?Patient presented to the ED on 4/12 after he tripped while going up the steps at home. ?He was found to have right periprosthetic hip fracture, s/p ORIF on 4/14. ?  ?Preoperatively he had desaturations into the 80s on the floor, and was found to be in the 60s on preop.  He was intubated preoperatively due to hypoxia.  He had a prolonged surgery about 4hrs but was able to be extubated in the PACU.  He had prolonged time to wake up.  ABG with mild acute hypercapnia.  He was transferred to icu post op due to  altered mental status, hypoxia, hypercapnia. He required vasopressors briefly , weaned off levophed on 4/15 am. He also required NRB while in the ICU, now on [facility].  He was transferred to [facility] service on 4/16. ?Currently remains altered and is on restraints. ? ?Subjective: ?Patient was seen and examined this morning.   ?Elderly Caucasian male.  Alert, awake, confused, NG tube in place.  Feeding on hold since last night for PEG tube placement today. ? ? Principal Problem: ?  Right femoral fracture (HCC) ?Active Problems: ?  Depression ?  Chronic obstructive pulmonary disease (HCC) ?  AKI (acute kidney injury) (HCC) ?  Status post lobectomy of lung ?  Pressure injury of skin ?  Dehydration ?  Leukocytosis ?  Mixed hyperlipidemia ?  Chronic kidney disease, stage 3a (HCC) ?  Acute on chronic respiratory failure with hypoxia and hypercapnia (HCC) ?  Respiratory acidosis ?  ?Assessment and Plan: ?Acute Metabolic Encephalopathy  ?-Patient remains disoriented, restless and is requiring restraints.   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   ?-Continue frequent reorientation.   ?-Currently on Zoloft 200 mg daily, Seroquel 25 mg twice daily and hydroxyzine 10 mg 3 times daily as needed through NG tube. ?-Continue as needed Haldol.  QTc less than 400 ms. ? ?Right Periprosthetic Hip Fracture ?-Secondary to mechanical fall  ?-4/14, s/p revision THA for periprosthetic femur fracture ?-Per orthopedics, 20% partial weightbearing on the right lower extremity, posterior hip precautions for 6 weeks.  DVT prophylaxis with Lovenox 40 mg daily.  Currently not on any scheduled pain medicines.  Add Tylenol scheduled. ? ?Post op foot drop ?-PRAFO boot in place. ?-post operative care per Ortho, details please see ortho note on 4/16 ? ?Septic shock secondary to aspiration pneumonia - not POA ?Acute hypoxic hypercapnic respiratory failure ?-Was on pressors briefly in the ICU, now off pressors, blood pressure stable  ?-414, CXR with right-sided pneumonia/atelectasis ?-Intubated preoperatively, was able to extubate postop, briefly required NRB, now on [place] 3 L ?-Currently on oral Augmentin. ? ?Chronic dysphagia ?Chronic aspiration ?h/o bronchiectasis and RLL lobectomy ?-Aspiration precautions ?-speech eval obtained.  Patient was noted to have significant gross aspiration with any kind of food.  N.p.o. was recommended.  It seems patient had PEG tube in the past in 2022 which was eventually removed.  Currently has NG tube feeding on hold.  Pending PEG tube placement today. ?  ?COPD w/o Exacerbation,  ?-Continue home Incruse and Dulera ?    ?  Acute on chronic anemia ?-Baseline hemoglobin more than 11. ?-This hospitalization, hemoglobin is running close to 7.  Lowest was 6.5 for which 1 unit of PRBC was given.  Hemoglobin 8 this morning.  Continue to monitor. ?-Transfuse if less than 7. ?-Continue Lovenox and Plavix. ?Recent Labs  ?  05/23/21 ?0124 05/24/21 ?0243 05/25/21 ?0428 05/27/21 ?4696  05/28/21 ?0825  ?HGB 7.1* 7.2* 8.0* 7.6* 8.4*  ?MCV 85.4 86.5 85.5 87.6 87.5  ?VITAMINB12  --  727  --   --   --   ?FOLATE  --   --  14.8  --   --   ?FERRITIN  --   --  900*  --   --   ?TIBC  --   --  147*  --   --   ?IRON  --   --  20*  --   --   ?RETICCTPCT  --   --  1.9  --   --   ? ?AKI on CKD IIIa  ?-Baseline creatinine less than 1.5.  Creatinine was elevated to peak of 1.95.  Improving now.  Continue to monitor. ?Recent Labs  ?  05/19/21 ?1645 05/20/21 ?0428 05/21/21 ?0303 05/22/21 ?0522 05/23/21 ?0124 05/24/21 ?0243 05/25/21 ?1056 05/26/21 ?2952 05/27/21 ?8413 05/28/21 ?0825  ?BUN 25* 27* 36* 37* 33* 34* 34* 27* 21 17  ?CREATININE 1.40* 1.47* 1.95* 1.49* 1.19 1.37* 0.88 0.97 0.82 0.87  ? ?Hypokalemia/hypophosphatemia ?-Low electrolyte level because of poor oral intake.  Improving with replacement and tube feeding.  Continue to monitor ?Recent Labs  ?Lab 05/24/21 ?0243 05/24/21 ?1715 05/25/21 ?0428 05/25/21 ?1056 05/25/21 ?1722 05/26/21 ?2440 05/27/21 ?1027 05/28/21 ?0825  ?K 4.0  --   --  2.9*  --  2.7* 3.4* 3.2*  ?MG  --    < > 2.1  --  1.8 1.9 1.8 1.8  ?PHOS  --    < > 2.0*  --  1.8* 1.7* 3.5 2.8  ? < > = values in this interval not displayed.  ? ? ?PVD/HLD ?PAD with intermittent claudication on the left LE and asymptomatic carotid stenosis  ?S/p RIGHT TRANSCAROTID ARTERY REVASCULARIZATION 02/2021 ?-Because of low hemoglobin, Plavix was held on 4/14. ?-4/17, resume Plavix.  Continue aspirin and statin ?  ?Goals of care ?  Code Status: Full Code  ? ? ?Mobility: Needs continued PT follow-up unable to follow commands ? ?Skin assessment:  ?Pressure Injury Heel Posterior;Right Stage 1 -  Intact skin with non-blanchable redness of a localized area usually over a bony prominence. (Active)  ?   ?Location: Heel  ?Location Orientation: Posterior;Right  ?Staging: Stage 1 -  Intact skin with non-blanchable redness of a localized area usually over a bony prominence.  ?Wound Description (Comments):   ?Present on  Admission: Yes  ?   ?Pressure Injury 05/22/21 Toe (Comment  which one) Anterior;Left scabed over (Active)  ?05/22/21 0700  ?Location: Toe (Comment  which one) (5th toe)  ?Location Orientation: Anterior;Left  ?Staging:   ?Wound Description (Comments): scabed over  ?Present on Admission:   ? ? ?Nutritional status:  ?Body mass index is 24.17 kg/m?Marland Kitchen  ?Nutrition Problem: Severe Malnutrition ?Etiology: chronic illness (COPD) ?Signs/Symptoms: severe fat depletion, severe muscle depletion ? ? ? ? ?Diet:  ?Diet Order   ? ?       ?  Diet NPO time specified Except for: Ice Chips  Diet effective now       ?  ? ?  ?  ? ?  ? ? ?  DVT prophylaxis:  ?enoxaparin (LOVENOX) injection 40 mg Start: 05/22/21 1000 ?SCDs Start: 05/20/21 0011 ?  ?Antimicrobials: Augmentin ?Fluid: Can stop IV fluid. ?Consultants: Orthopedics ?Family Communication: Wife not at bedside today. ? ?Status is: Inpatient ? ?Continue in-hospital care because: Remains altered, pending PEG tube placement ?Level of care: Med-Surg  ? ?Dispo: The patient is from: Home ?             Anticipated d/c is to: SNF ?             Patient currently is not medically stable to d/c. ?  Difficult to place patient No ? ? ? ? ?Infusions:  ? sodium chloride    ? feeding supplement (OSMOLITE 1.5 CAL) 1,000 mL (05/26/21 1742)  ? ? ?Scheduled Meds: ? sodium chloride   Intravenous Once  ? acetaminophen  1,000 mg Oral Q8H  ? atorvastatin  80 mg Oral QHS  ? chlorhexidine  15 mL Mouth Rinse BID  ? Chlorhexidine Gluconate Cloth  6 each Topical Q0600  ? enoxaparin  40 mg Subcutaneous Q24H  ? feeding supplement (PROSource TF)  45 mL Per Tube BID  ? free water  160 mL Per Tube Q4H  ? mouth rinse  15 mL Mouth Rinse q12n4p  ? mometasone-formoterol  2 puff Inhalation BID  ? QUEtiapine  25 mg Oral BID  ? sertraline  200 mg Oral Daily  ? umeclidinium bromide  1 puff Inhalation q AM  ? ? ?PRN meds: ?hydrALAZINE, hydrOXYzine, ipratropium-albuterol, LORazepam  ? ?Antimicrobials: ?Anti-infectives (From  admission, onward)  ? ? Start     Dose/Rate Route Frequency Ordered Stop  ? 05/24/21 1200  amoxicillin-clavulanate (AUGMENTIN) 875-125 MG per tablet 1 tablet       ? 1 tablet Oral Every 12 hours 05/24/21 1101 04/20/

## 2021-05-28 NOTE — Progress Notes (Signed)
? ? ? ?  Subjective: ?Patient more alert today and answering some questions, but remains disoriented to situation. Lying comfortably in bed. Denies having any pain. Plan for G tube placement today.   ? ?Objective:  ? ?VITALS:   ?Vitals:  ? 05/27/21 1404 05/27/21 2103 05/28/21 0827 05/28/21 0830  ?BP: (!) 139/56 134/66    ?Pulse: 84 74    ?Resp: 18 16    ?Temp: 98.4 ?F (36.9 ?C) 98.2 ?F (36.8 ?C)    ?TempSrc:      ?SpO2: 100% 94% 92% 92%  ?Weight:      ?Height:      ? ? ?Sensation intact distally to the plantar and dorsal aspects of the foot ?Intact pulses distally ?Plantar flexion intact, but unable to demonstrate active ankle dorsiflexion or EHL function ?Skin checked under prafo, skin intact no sores ?Incision: prevena removed. Incison c/d/I with staples, no erythema or drainage. ? ? ?Lab Results  ?Component Value Date  ? WBC 16.0 (H) 05/28/2021  ? HGB 8.4 (L) 05/28/2021  ? HCT 26.7 (L) 05/28/2021  ? MCV 87.5 05/28/2021  ? PLT 331 05/28/2021  ? ?BMET ?   ?Component Value Date/Time  ? NA 139 05/28/2021 0825  ? K 3.2 (L) 05/28/2021 0825  ? CL 109 05/28/2021 0825  ? CO2 25 05/28/2021 0825  ? GLUCOSE 115 (H) 05/28/2021 0825  ? BUN 17 05/28/2021 0825  ? CREATININE 0.87 05/28/2021 0825  ? CALCIUM 8.3 (L) 05/28/2021 0825  ? GFRNONAA >60 05/28/2021 0825  ? ? ?Xray: Postop x-rays demonstrate excellent reduction of the periprosthetic fracture good alignment of hardware no adverse features. ? ?Assessment/Plan: ?7 Days Post-Op  ? ?Principal Problem: ?  Right femoral fracture ([place]) ?Active Problems: ?  Depression ?  Chronic obstructive pulmonary disease (HCC) ?  AKI (acute kidney injury) ([place]) ?  Status post lobectomy of lung ?  Pressure injury of skin ?  Dehydration ?  Leukocytosis ?  Mixed hyperlipidemia ?  Chronic kidney disease, stage 3a ([place]) ?  Acute on chronic respiratory failure with hypoxia and hypercapnia (HCC) ?  Respiratory acidosis ? ?S/p revision THA for periprosthetic femur fracture 4/14 ? ?Post op foot drop: AFO  ordered, likely intraop stretch injury, no significant swelling in the buttock area and not having much pain so low concern for nerve laceration.  patient endorses intact sensation to dorsal foot, but given his altered mental status may not be a reliable exam. Will continue to monitor. ? ?Post op recs: ?WB: 20% PWB RLE, posterior hip precautions x6 weeks ?Abx: Currently on Augmentin for pneumonia ?PT for mobilization ?Imaging: PACU pelvis/femur Xray ?Dressing: prevena vac removed 4/21, changed to aquacell can remain intact 7-10 more days until staple removal. ?DVT prophylaxis: lovenox '40mg'$  daily x 4 weeks ?Follow up: 1 week post discharge for a wound check with Dr. Zachery Dakins at [facility].  ?Address: [address] [place], [place], [place] [address]  ?Office Phone: [phone] ? ? ? ?William Kirk A William Kirk ?05/28/2021, 11:18 AM ? ? ?Charlies Constable, MD ? ?Contact information:   ?Weekdays 7am-5pm epic message Dr. Zachery Dakins, or call office for patient follow up: [phone] ?After hours and holidays please check [email or web address] for group call information for [facility] ? ?  ?

## 2021-05-28 NOTE — Progress Notes (Signed)
Per MD, plan for PEG placement today and dc Sunday or Monday to SNF. Updated [place] admissions and re need to start auth. SW will follow.  ? ?Wandra Feinstein, MSW, LCSW ?[phone] (coverage) ? ? ?

## 2021-05-28 NOTE — Plan of Care (Signed)

## 2021-05-28 NOTE — Procedures (Signed)
Interventional Radiology Procedure: ? ? ?Indications: Acute metabolic encephalopathy ? ?Procedure: Gastrostomy tube placement ? ?Findings: 20 Fr tube in stomach.  ? ?Complications: No immediate complications noted. ?    ?EBL: Minimal ? ?Plan: May used tube for medications today.  Anticipate starting feeds tomorrow.  ? ? ?Zakery Normington R. Anselm Pancoast, MD  ?Pager: [phone] ? ? ? ?  ?

## 2021-05-28 NOTE — Progress Notes (Signed)
Physical Therapy Treatment ?Patient Details ?Name: William Kirk ?MRN: [id number] ?DOB: September 25, 1940 ?Today's Date: 05/28/2021 ? ? ?History of Present Illness 81 y.o. male who presents to the emergency department via EMS from home after sustaining a mechanical fall PTA. periprosthetic right femoral fracture Patient tripped while going up the steps. 4/14 ORIF with replacing the femoral component of previous THA; post-op confusion with hypoxia; required vasopressors;   PMH significant of PVD, COPD, hyperlipidemia, CKD 3A ? ?  ?PT Comments  ? ? Pt received supine sleeping but easily woken. Upon waking pt agitated/confused and pulling at lines with confusion as to his location and reason why. With conversation/comfort and pursed lip breathing pt calmed and agreeable to session. Pt able to come to sitting EOB with min assist with pt bringing LEs to and off EOB and elevating torso, assist needed to slide with bedpad for B feet on floor. Pt able to come to standing with mod a with RW with safe hand placement and fair compliance with weight bearing precautions. Stepping toward HOB initiated, pt able to take steps x3 toward [facility] with heavy multimodal cues, further ambulation deferred secondary to pain and inability to maintain WB precautions. Pt continues to benefit from skilled PT services to progress toward functional mobility goals.  ?  ?Recommendations for follow up therapy are one component of a multi-disciplinary discharge planning process, led by the attending physician.  Recommendations may be updated based on patient status, additional functional criteria and insurance authorization. ? ?Follow Up Recommendations ? Skilled nursing-short term rehab (<3 hours/day) ?  ?  ?Assistance Recommended at Discharge Frequent or constant Supervision/Assistance  ?Patient can return home with the following Two people to help with walking and/or transfers;Assistance with cooking/housework;Two people to help with  bathing/dressing/bathroom;Direct supervision/assist for medications management;Direct supervision/assist for financial management;Assist for transportation;Help with stairs or ramp for entrance ?  ?Equipment Recommendations ? Rolling walker (2 wheels);Wheelchair (measurements PT);Wheelchair cushion (measurements PT);BSC/3in1  ?  ?Recommendations for Other Services OT consult ? ? ?  ?Precautions / Restrictions Precautions ?Precautions: Posterior Hip;Fall ?Precaution Booklet Issued: No ?Precaution Comments: pt cannot verbalize precautions ?Restrictions ?Weight Bearing Restrictions: Yes ?RLE Weight Bearing: Partial weight bearing ?RLE Partial Weight Bearing Percentage or Pounds: 20  ?  ? ?Mobility ? Bed Mobility ?Overal bed mobility: Needs Assistance ?Bed Mobility: Supine to Sit, Sit to Supine ?  ?  ?Supine to sit: Min assist ?Sit to supine: Mod assist ?  ?General bed mobility comments: min a to come to EOB, heavy step by step cueing, pt able to mobilize LEs to EOB and elevate trunk, assist with bedpad to come all the way to endge,  mod a to return BLE into bed and reposition ?  ? ?Transfers ?Overall transfer level: Needs assistance ?Equipment used: Rolling walker (2 wheels) ?Transfers: Sit to/from Stand ?Sit to Stand: Mod assist ?  ?  ?  ?  ?  ?General transfer comment: mod a to power up to stand, pt pushing from bed this session, haevy cues to elevate trunk once standing, able to come to standing x2 ?  ? ?Ambulation/Gait ?Ambulation/Gait assistance: Max assist ?Gait Distance (Feet): 2 Feet ?Assistive device: Rolling walker (2 wheels) ?Gait Pattern/deviations: Step-to pattern ?  ?  ?Pre-gait activities: weight shifting non operative side in standing ?General Gait Details: able to begin to initiate side stepping toward HOB. further gait defered seconadry to pain and pt unable to maintain WB precautions ? ? ?Stairs ?  ?  ?  ?  ?  ? ? ?  Wheelchair Mobility ?  ? ?Modified Rankin (Stroke Patients Only) ?  ? ? ?  ?Balance  Overall balance assessment: History of Falls, Needs assistance ?Sitting-balance support: No upper extremity supported, Feet supported ?Sitting balance-Leahy Scale: Fair ?  ?  ?Standing balance support: Bilateral upper extremity supported, Reliant on assistive device for balance ?Standing balance-Leahy Scale: Poor ?  ?  ?  ?  ?  ?  ?  ?  ?  ?  ?  ?  ?  ? ?  ?Cognition Arousal/Alertness: Awake/alert ?Behavior During Therapy: Impulsive, Restless ?Overall Cognitive Status: No family/caregiver present to determine baseline cognitive functioning ?  ?  ?  ?  ?  ?  ?  ?  ?  ?  ?  ?  ?  ?  ?  ?  ?General Comments: pt unable to recall precautions, pleasant affect ?  ?  ? ?  ?Exercises   ? ?  ?General Comments General comments (skin integrity, edema, etc.): VSS on 4L ?  ?  ? ?Pertinent Vitals/Pain Pain Assessment ?Pain Assessment: Faces ?Faces Pain Scale: Hurts whole lot ?Pain Location: RLE in weightbearing ?Pain Descriptors / Indicators: Moaning, Operative site guarding ?Pain Intervention(s): Limited activity within patient's tolerance, Monitored during session, Repositioned  ? ? ?Home Living   ?  ?  ?  ?  ?  ?  ?  ?  ?  ?   ?  ?Prior Function    ?  ?  ?   ? ?PT Goals (current goals can now be found in the care plan section) Acute Rehab PT Goals ?PT Goal Formulation: With patient ?Time For Goal Achievement: 06/05/21 ? ?  ?Frequency ? ? ? Min 3X/week ? ? ? ?  ?PT Plan Current plan remains appropriate  ? ? ?Co-evaluation   ?  ?  ?  ?  ? ?  ?AM-PAC PT "6 Clicks" Mobility   ?Outcome Measure ? Help needed turning from your back to your side while in a flat bed without using bedrails?: A Lot ?Help needed moving from lying on your back to sitting on the side of a flat bed without using bedrails?: A Lot ?Help needed moving to and from a bed to a chair (including a wheelchair)?: Total ?Help needed standing up from a chair using your arms (e.g., wheelchair or bedside chair)?: A Lot ?Help needed to walk in hospital room?: A Lot ?Help  needed climbing 3-5 steps with a railing? : Total ?6 Click Score: 10 ? ?  ?End of Session Equipment Utilized During Treatment: Oxygen;Gait belt ?Activity Tolerance: Patient tolerated treatment well;Patient limited by pain ?Patient left: in bed;with call bell/phone within reach;with bed alarm set;with restraints reapplied ?Nurse Communication: Mobility status ?PT Visit Diagnosis: Other abnormalities of gait and mobility (R26.89);History of falling (Z91.81) ?  ? ? ?Time: 1202-1226 ?PT Time Calculation (min) (ACUTE ONLY): 24 min ? ?Charges:  $Gait Training: 8-22 mins ?$Therapeutic Activity: 8-22 mins          ?          ?Lenora Boys. PTA ?Acute Rehabilitation Services ?Office: [phone] ? ? ? ?William Kirk ?05/28/2021, 12:39 PM ? ?

## 2021-05-29 DIAGNOSIS — S72001A Fracture of unspecified part of neck of right femur, initial encounter for closed fracture: Secondary | ICD-10-CM | POA: Diagnosis not present

## 2021-05-29 LAB — BASIC METABOLIC PANEL
Anion gap: 5 (ref 5–15)
BUN: 19 mg/dL (ref 8–23)
CO2: 23 mmol/L (ref 22–32)
Calcium: 7.7 mg/dL — ABNORMAL LOW (ref 8.9–10.3)
Chloride: 112 mmol/L — ABNORMAL HIGH (ref 98–111)
Creatinine, Ser: 0.86 mg/dL (ref 0.61–1.24)
GFR, Estimated: >60 mL/min (ref >60)
Glucose, Bld: 98 mg/dL (ref 70–99)
Potassium: 3 mmol/L — ABNORMAL LOW (ref 3.5–5.1)
Sodium: 140 mmol/L (ref 135–145)

## 2021-05-29 LAB — GLUCOSE, CAPILLARY
Glucose-Capillary: 103 mg/dL — ABNORMAL HIGH (ref 70–99)
Glucose-Capillary: 115 mg/dL — ABNORMAL HIGH (ref 70–99)
Glucose-Capillary: 85 mg/dL (ref 70–99)
Glucose-Capillary: 87 mg/dL (ref 70–99)
Glucose-Capillary: 94 mg/dL (ref 70–99)
Glucose-Capillary: 99 mg/dL (ref 70–99)

## 2021-05-29 LAB — CBC
HCT: 21.9 % — ABNORMAL LOW (ref 39.0–52.0)
Hemoglobin: 7 g/dL — ABNORMAL LOW (ref 13.0–17.0)
MCH: 27.7 pg (ref 26.0–34.0)
MCHC: 32 g/dL (ref 30.0–36.0)
MCV: 86.6 fL (ref 80.0–100.0)
Platelets: 255 10*3/uL (ref 150–400)
RBC: 2.53 MIL/uL — ABNORMAL LOW (ref 4.22–5.81)
RDW: 16.4 % — ABNORMAL HIGH (ref 11.5–15.5)
WBC: 10.4 10*3/uL (ref 4.0–10.5)
nRBC: 0 % (ref 0.0–0.2)

## 2021-05-29 MED ORDER — JUVEN PO PACK
1.0000 | PACK | Freq: Two times a day (BID) | ORAL | Status: DC
  Administered 2021-05-29 – 2021-06-02 (×8): 1
  Filled 2021-05-29 (×7): qty 1

## 2021-05-29 MED ORDER — OSMOLITE 1.5 CAL PO LIQD
1000.0000 mL | ORAL | Status: DC
  Administered 2021-05-29 – 2021-06-01 (×5): 1000 mL
  Filled 2021-05-29 (×5): qty 1000

## 2021-05-29 MED ORDER — QUETIAPINE FUMARATE 25 MG PO TABS
50.0000 mg | ORAL_TABLET | Freq: Two times a day (BID) | ORAL | Status: DC
  Administered 2021-05-29 – 2021-06-02 (×8): 50 mg via ORAL
  Filled 2021-05-29 (×8): qty 2

## 2021-05-29 MED ORDER — WHITE PETROLATUM EX OINT
TOPICAL_OINTMENT | CUTANEOUS | Status: DC | PRN
  Filled 2021-05-29: qty 5

## 2021-05-29 NOTE — Progress Notes (Signed)
Patient ID: William Kirk, male   DOB: 01-Mar-1940, 81 y.o.   MRN: [id number] ?Patient status post placement of 20 French pull-through gastrostomy tube yesterday.  He is afebrile, BP okay, WBC normal, hemoglobin 7 down from 8.4- ? transfuse, potassium 3- replace; abdomen soft, G-tube intact, insertion site okay, mildly tender as expected, no insertion site drainage noted; okay to use G-tube as needed; monitor hemoglobin closely. ? ?

## 2021-05-29 NOTE — Progress Notes (Signed)
Nutrition Follow-up ? ?DOCUMENTATION CODES:  ? ?Severe malnutrition in context of chronic illness ? ?INTERVENTION:  ? ?Resume Osmolite 1.5@60ml /hr + ProSource TF 36m BID via tube  ? ?Free water flushes [id number] hours  ? ?Regimen provides 2240kcal/day, 112g/day protein and 205765may of free water  ? ?Juven Fruit Punch BID via tube, each serving provides 95kcal and 2.5g of protein (amino acids glutamine and arginine) ? ?NUTRITION DIAGNOSIS:  ? ?Severe Malnutrition related to chronic illness (COPD) as evidenced by severe fat depletion, severe muscle depletion. ? ?GOAL:  ? ?Patient will meet greater than or equal to 90% of their needs ?-met with tube feeds ? ?MONITOR:  ? ?Labs, Weight trends, TF tolerance, Skin, I & O's ? ?ASSESSMENT:  ? ?William Kirk a [age]o. male with PMH significant for HTN, PAD, CKD 3, COPD, dysphagia, bronchiectasis, right lower lobe lobectomy in 2020  due to nonfunctional RLL from multiple aspiration pneumonia. ? ?RD working remotely. ? ?Pt s/p IR placement of 20 French pull-through gastrostomy tube 4/21 ? ?Pt s/p G-tube placement yesterday. Per radiology, ok to use tube today; will resume tube feeds. Refeed labs appear stable. Will add Juven to support wound healing. Per chart, pt is up ~6lbs since admission.  ? ?Medications reviewed and include: lovenox ?  ?Labs reviewed: K 3.0(L) ?P 2.8 wnl, Mg 1.8 wnl- 4/21 ?Hgb 7.0(L), Hct 21.9(L) ?Cbgs- 94, 103, 99 x 24 hrs ? ?NUTRITION - FOCUSED PHYSICAL EXAM: ?Unable to perform at this time  ? ?Diet Order:   ?Diet Order   ? ?       ?  Diet NPO time specified Except for: Ice Chips  Diet effective now       ?  ? ?  ?  ? ?  ? ?EDUCATION NEEDS:  ? ?Not appropriate for education at this time ? 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 ?Last BM:  05/24/21 ? ?Height:  ? ?Ht Readings from Last 1  Encounters:  ?05/19/21 5' 10"  (1.778 m)  ? ? ?Weight:  ? ?Wt Readings from Last 1 Encounters:  ?05/27/21 76.4 kg  ? ? ?Ideal Body Weight:  75.5 kg ? ?BMI:  Body mass index is 24.17 kg/m?. ? ?Estimated Nutritional Needs:  ? ?Kcal:  2100-2300 ? ?Protein:  105-120 grams ? ?Fluid:  > 2 L ? ?CasKoleen Distance, RD, LDN ?Please refer to [facility] for RD and/or RD on-call/weekend/after hours pager ? ?

## 2021-05-29 NOTE — Progress Notes (Signed)
?PROGRESS NOTE ? ?William Kirk  ?DOB: July 15, 1940  ?PCP: Floreen Comber, PA-C ?WUJ:[id number]  ?DOA: 05/19/2021 ? LOS: 10 days  ?Hospital Day: 11 ? ?Brief narrative: ?William Kirk is a 81 y.o. male with PMH significant for HTN, PAD, CKD 3, COPD, dysphagia, bronchiectasis, right lower lobe lobectomy in 2020  due to nonfunctional RLL from multiple aspiration pneumonia. ?Patient presented to the ED on 4/12 after he tripped while going up the steps at home. ?He was found to have right periprosthetic hip fracture, s/p ORIF on 4/14. ?  ?Preoperatively he had desaturations into the 80s on the floor, and was found to be in the 60s on preop.  He was intubated preoperatively due to hypoxia.  He had a prolonged surgery about 4hrs but was able to be extubated in the PACU.  He had prolonged time to wake up.  ABG with mild acute hypercapnia.  He was transferred to icu post op due to  altered mental status, hypoxia, hypercapnia. He required vasopressors briefly , weaned off levophed on 4/15 am. He also required NRB while in the ICU, now on [facility].  He was transferred to [facility] service on 4/16. ?Currently remains altered and is on restraints. ? ?Subjective: ?Patient was seen and examined this morning.   ?Pleasant elderly male.  Wrist tied to the railings. ?-PEG tube feeding started today.  Family not at bedside. ? ? Principal Problem: ?  Right femoral fracture (HCC) ?Active Problems: ?  Depression ?  Chronic obstructive pulmonary disease (HCC) ?  AKI (acute kidney injury) (HCC) ?  Status post lobectomy of lung ?  Pressure injury of skin ?  Dehydration ?  Leukocytosis ?  Mixed hyperlipidemia ?  Chronic kidney disease, stage 3a (HCC) ?  Acute on chronic respiratory failure with hypoxia and hypercapnia (HCC) ?  Respiratory acidosis ?  ?Assessment and Plan: ?Acute Metabolic Encephalopathy  ?-Patient remains disoriented, restless and is requiring restraints.   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   ?-Continue frequent reorientation.   ?-Currently on Zoloft 200 mg daily, Seroquel 25 mg twice daily and hydroxyzine 10 mg 3 times daily as needed through NG tube.  Mental status remains difficult to control.  I will increase the dose of Seroquel to 50 mg twice daily from tonight. ?-Continue as needed Haldol.  QTc less than 400 ms. ? ?Right Periprosthetic Hip Fracture ?-Secondary to mechanical fall  ?-4/14, s/p revision THA for periprosthetic femur fracture ?-Per orthopedics, 20% partial weightbearing on the right lower extremity, posterior hip precautions for 6 weeks.  DVT prophylaxis with Lovenox 40 mg daily.  Currently not on any scheduled pain medicines.  Add Tylenol scheduled. ? ?Post op foot drop ?-PRAFO boot in place. ?-post operative care per Ortho, details please see ortho note on 4/16 ? ?Septic shock secondary to aspiration pneumonia - not POA ?Acute hypoxic hypercapnic respiratory failure ?-Was on pressors briefly in the ICU, now off pressors, blood pressure stable  ?-414, CXR with right-sided pneumonia/atelectasis ?-Intubated preoperatively, was able to extubate postop, briefly required NRB, now on Tyro 3 L ?-Currently on oral Augmentin. ? ?Chronic dysphagia ?Chronic aspiration ?h/o bronchiectasis and RLL lobectomy ?-Aspiration precautions ?-speech eval obtained.  Patient was noted to have significant gross aspiration with any kind of food.  N.p.o. was recommended.  It seems patient had PEG tube in the past in 2022 which was eventually removed.  Currently has NG tube feeding on hold.  Underwent PEG tube placement by IR on 4/21. ? ?Severe protein calorie malnutrition ?-Dietitian consul appreciated.  Currently on tube feeding ?  ?COPD w/o Exacerbation,  ?-Continue home Incruse and Dulera ?    ?Acute on chronic anemia ?-Baseline hemoglobin more than 11. ?-This hospitalization, hemoglobin is running close to 7.  Lowest was 6.5 for which 1  unit of PRBC was given.  Hemoglobin dropped from 8.4 yesterday to 7 today.  No active bleeding noted.  Continue to monitor.  I will order for repeat CBC as well as a type and screen for tomorrow morning just in case he needs transfusion. ?-Currently on Lovenox and Plavix. ?Recent Labs  ?  05/24/21 ?0243 05/25/21 ?0428 05/27/21 ?6962 05/28/21 ?0825 05/29/21 ?9528  ?HGB 7.2* 8.0* 7.6* 8.4* 7.0*  ?MCV 86.5 85.5 87.6 87.5 86.6  ?[id number]  --   --   --   --   ?FOLATE  --  14.8  --   --   --   ?FERRITIN  --  900*  --   --   --   ?TIBC  --  147*  --   --   --   ?IRON  --  20*  --   --   --   ?RETICCTPCT  --  1.9  --   --   --   ? ?AKI on CKD IIIa  ?-Baseline creatinine less than 1.5.  Creatinine was elevated to peak of 1.95.  Improving now.  Continue to monitor. ?Recent Labs  ?  05/20/21 ?0428 05/21/21 ?0303 05/22/21 ?0522 05/23/21 ?0124 05/24/21 ?0243 05/25/21 ?1056 05/26/21 ?7253 05/27/21 ?6644 05/28/21 ?0825 05/29/21 ?0347  ?BUN 27* 36* 37* 33* 34* 34* 27* 21 17 19   ?CREATININE 1.47* 1.95* 1.49* 1.19 1.37* 0.88 0.97 0.82 0.87 0.86  ? ?Hypokalemia/hypophosphatemia ?-Low electrolyte level because of poor oral intake.  Improving with replacement and tube feeding.  Continue to monitor ?Recent Labs  ?Lab 05/25/21 ?0428 05/25/21 ?1056 05/25/21 ?1722 05/26/21 ?4259 05/27/21 ?5638 05/28/21 ?0825 05/29/21 ?7564  ?K  --  2.9*  --  2.7* 3.4* 3.2* 3.0*  ?MG 2.1  --  1.8 1.9 1.8 1.8  --   ?PHOS 2.0*  --  1.8* 1.7* 3.5 2.8  --   ? ? ?PVD/HLD ?PAD with intermittent claudication on the left LE and asymptomatic carotid stenosis  ?S/p RIGHT TRANSCAROTID ARTERY REVASCULARIZATION 02/2021 ?-Because of low hemoglobin, Plavix was held on 4/14. ?-4/17, resume Plavix.  Continue aspirin and statin ?  ?Goals of care ?  Code Status: Full Code  ? ? ?Mobility: Needs continued PT follow-up unable to follow commands ? ?Skin assessment:  ?Pressure Injury Heel Posterior;Right Stage 1 -  Intact skin with non-blanchable redness of a localized area  usually over a bony prominence. (Active)  ?   ?Location: Heel  ?Location Orientation: Posterior;Right  ?Staging: Stage 1 -  Intact skin with non-blanchable redness of a localized area usually over a bony prominence.  ?Wound Description (Comments):   ?Present on Admission: Yes  ?   ?Pressure Injury 05/22/21 Toe (Comment  which one) Anterior;Left scabed over (Active)  ?05/22/21 0700  ?Location: Toe (Comment  which one) (5th toe)  ?Location Orientation: Anterior;Left  ?Staging:   ?Wound Description (Comments): scabed over  ?Present on Admission:   ? ? ?Nutritional status:  ?Body mass index is 24.17 kg/m?Marland Kitchen  ?Nutrition Problem: Severe Malnutrition ?Etiology: chronic illness (COPD) ?Signs/Symptoms: severe fat depletion, severe muscle depletion ? ? ? ? ?Diet:  ?Diet Order   ? ?       ?  Diet NPO time specified Except for: Ice Chips  Diet effective now       ?  ? ?  ?  ? ?  ? ? ?DVT prophylaxis:  ?enoxaparin (LOVENOX) injection 40 mg Start: 05/22/21 1000 ?SCDs Start: 05/20/21 0011 ?  ?Antimicrobials: Augmentin ?Fluid: IV fluids stopped ?Consultants: Orthopedics ?Family Communication: Wife not at bedside today. ? ?Status is: Inpatient ? ?Continue in-hospital care because: Remains altered, underwent PEG tube placement on 4/21. ?Level of care: Med-Surg  ? ?Dispo: The patient is from: Home ?             Anticipated d/c is to: SNF ?             Patient currently is not medically stable to d/c. ?  Difficult to place patient No ? ? ? ? ?Infusions:  ? sodium chloride    ? feeding supplement (OSMOLITE 1.5 CAL)    ? ? ?Scheduled Meds: ? sodium chloride   Intravenous Once  ? acetaminophen  1,000 mg Oral Q8H  ? atorvastatin  80 mg Oral QHS  ? chlorhexidine  15 mL Mouth Rinse BID  ? Chlorhexidine Gluconate Cloth  6 each Topical Q0600  ? enoxaparin  40 mg Subcutaneous Q24H  ? feeding supplement (PROSource TF)  45 mL Per Tube BID  ? free water  160 mL Per Tube Q4H  ? mouth rinse  15 mL Mouth Rinse q12n4p  ? mometasone-formoterol  2 puff  Inhalation BID  ? nutrition supplement (JUVEN)  1 packet Per Tube BID BM  ? QUEtiapine  50 mg Oral BID  ? sertraline  200 mg Oral Daily  ? umeclidinium bromide  1 puff Inhalation q AM  ? ? ?PRN meds: ?hydrALA

## 2021-05-30 DIAGNOSIS — S72001A Fracture of unspecified part of neck of right femur, initial encounter for closed fracture: Secondary | ICD-10-CM | POA: Diagnosis not present

## 2021-05-30 LAB — GLUCOSE, CAPILLARY
Glucose-Capillary: 101 mg/dL — ABNORMAL HIGH (ref 70–99)
Glucose-Capillary: 106 mg/dL — ABNORMAL HIGH (ref 70–99)
Glucose-Capillary: 120 mg/dL — ABNORMAL HIGH (ref 70–99)
Glucose-Capillary: 128 mg/dL — ABNORMAL HIGH (ref 70–99)
Glucose-Capillary: 131 mg/dL — ABNORMAL HIGH (ref 70–99)
Glucose-Capillary: 91 mg/dL (ref 70–99)

## 2021-05-30 LAB — BASIC METABOLIC PANEL
Anion gap: 4 — ABNORMAL LOW (ref 5–15)
BUN: 22 mg/dL (ref 8–23)
CO2: 25 mmol/L (ref 22–32)
Calcium: 8.1 mg/dL — ABNORMAL LOW (ref 8.9–10.3)
Chloride: 110 mmol/L (ref 98–111)
Creatinine, Ser: 0.82 mg/dL (ref 0.61–1.24)
GFR, Estimated: >60 mL/min (ref >60)
Glucose, Bld: 85 mg/dL (ref 70–99)
Potassium: 2.9 mmol/L — ABNORMAL LOW (ref 3.5–5.1)
Sodium: 139 mmol/L (ref 135–145)

## 2021-05-30 LAB — CBC WITH DIFFERENTIAL/PLATELET
Abs Immature Granulocytes: 0.37 10*3/uL — ABNORMAL HIGH (ref 0.00–0.07)
Basophils Absolute: 0 10*3/uL (ref 0.0–0.1)
Basophils Relative: 0 %
Eosinophils Absolute: 0.6 10*3/uL — ABNORMAL HIGH (ref 0.0–0.5)
Eosinophils Relative: 4 %
HCT: 23.3 % — ABNORMAL LOW (ref 39.0–52.0)
Hemoglobin: 7.3 g/dL — ABNORMAL LOW (ref 13.0–17.0)
Immature Granulocytes: 3 %
Lymphocytes Relative: 11 %
Lymphs Abs: 1.5 10*3/uL (ref 0.7–4.0)
MCH: 27.3 pg (ref 26.0–34.0)
MCHC: 31.3 g/dL (ref 30.0–36.0)
MCV: 87.3 fL (ref 80.0–100.0)
Monocytes Absolute: 0.8 10*3/uL (ref 0.1–1.0)
Monocytes Relative: 5 %
Neutro Abs: 10.9 10*3/uL — ABNORMAL HIGH (ref 1.7–7.7)
Neutrophils Relative %: 77 %
Platelets: 307 10*3/uL (ref 150–400)
RBC: 2.67 MIL/uL — ABNORMAL LOW (ref 4.22–5.81)
RDW: 16.5 % — ABNORMAL HIGH (ref 11.5–15.5)
WBC: 14.1 10*3/uL — ABNORMAL HIGH (ref 4.0–10.5)
nRBC: 0 % (ref 0.0–0.2)

## 2021-05-30 LAB — PHOSPHORUS: Phosphorus: 2.6 mg/dL (ref 2.5–4.6)

## 2021-05-30 LAB — MAGNESIUM: Magnesium: 1.8 mg/dL (ref 1.7–2.4)

## 2021-05-30 MED ORDER — MELATONIN 3 MG PO TABS
3.0000 mg | ORAL_TABLET | Freq: Every day | ORAL | Status: DC
  Administered 2021-05-30 – 2021-06-01 (×3): 3 mg via ORAL
  Filled 2021-05-30 (×3): qty 1

## 2021-05-30 MED ORDER — POTASSIUM CHLORIDE 20 MEQ PO PACK: 40.0000 meq | PACK | ORAL | Status: DC

## 2021-05-30 MED ORDER — POTASSIUM CHLORIDE 20 MEQ PO PACK
40.0000 meq | PACK | ORAL | Status: AC
  Administered 2021-05-30 (×2): 40 meq
  Filled 2021-05-30 (×2): qty 2

## 2021-05-30 NOTE — Progress Notes (Signed)
No restraints on patient upon initial assessment. ? ?

## 2021-05-30 NOTE — Progress Notes (Signed)
?PROGRESS NOTE ? ?[name]  ?DOB: 30-Aug-1940  ?PCP: Floreen Comber, PA-C ?GEX:[id number]  ?DOA: 05/19/2021 ? LOS: 11 days  ?Hospital Day: 12 ? ?Brief narrative: ?William Kirk is a 81 y.o. male with PMH significant for HTN, PAD, CKD 3, COPD, dysphagia, bronchiectasis, right lower lobe lobectomy in 2020  due to nonfunctional RLL from multiple aspiration pneumonia. ?Patient presented to the ED on 4/12 after he tripped while going up the steps at home. ?He was found to have right periprosthetic hip fracture, s/p ORIF on 4/14. ?  ?Preoperatively he had desaturations into the 80s on the floor, and was found to be in the 60s on preop.  He was intubated preoperatively due to hypoxia.  He had a prolonged surgery about 4hrs but was able to be extubated in the PACU.  He had prolonged time to wake up.  ABG with mild acute hypercapnia.  He was transferred to icu post op due to  altered mental status, hypoxia, hypercapnia. He required vasopressors briefly , weaned off levophed on 4/15 am. He also required NRB while in the ICU, now on [facility].  He was transferred to [facility] service on 4/16. ?Currently remains altered and is on restraints. ? ?Subjective: ?Patient was seen and examined this morning.   ?Lying down in bed.  Alert, awake, not restless or agitated.  Off restraints since this morning.  PEG tube feeding ongoing. ?Per nursing staff, patient had 1 large liquid bowel movement this morning. ? ? ? Principal Problem: ?  Right femoral fracture (HCC) ?Active Problems: ?  Depression ?  Chronic obstructive pulmonary disease (HCC) ?  AKI (acute kidney injury) (HCC) ?  Status post lobectomy of lung ?  Pressure injury of skin ?  Dehydration ?  Leukocytosis ?  Mixed hyperlipidemia ?  Chronic kidney disease, stage 3a (HCC) ?  Acute on chronic respiratory failure with hypoxia and hypercapnia (HCC) ?  Respiratory acidosis ?  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  Mental status gradually improving.  Off restraints since this morning. ?-Continue Zoloft 200 mg daily, Seroquel 25 mg twice daily and hydroxyzine 10 mg 3 times daily as needed through PEG tube.   ?-Continue as needed Haldol.  QTc less than 400 ms. ? ?Right Periprosthetic Hip Fracture ?-Secondary to mechanical fall  ?-4/14, s/p revision THA for periprosthetic femur fracture ?-Per orthopedics, 20% partial weightbearing on the right lower extremity, posterior hip precautions for 6 weeks.  DVT prophylaxis with Lovenox 40 mg daily.  Currently not on any scheduled pain medicines.  Add Tylenol scheduled. ? ?Post op foot drop ?-PRAFO boot in place. ?-post operative care per Ortho, details please see ortho note on 4/16 ? ?Septic shock secondary to aspiration pneumonia - not POA ?Acute hypoxic hypercapnic respiratory failure ?-Was on pressors briefly in the ICU, now off pressors, blood pressure stable  ?-414, CXR with right-sided pneumonia/atelectasis ?-Intubated preoperatively, was able to extubate postop, briefly required NRB, subsequently weaned down.  Currently not on supplemental oxygen. ?-Completed a course of antibiotics. ? ?Chronic dysphagia ?Chronic aspiration ?h/o bronchiectasis and RLL lobectomy ?-Aspiration precautions ?-speech eval obtained.  Patient was noted to have significant gross aspiration with any kind of food.  N.p.o. was recommended.  It seems patient had PEG tube  in the past in 2022 which was eventually removed.  Currently has NG tube feeding on hold.  Underwent PEG tube placement by IR on 4/21. ?-Continue PEG tube feeding. ? ?Severe protein calorie malnutrition ?-Dietitian consul appreciated.  Currently on tube feeding ?  ?COPD w/o Exacerbation,  ?-Continue  home Incruse and Dulera ?    ?Acute on chronic anemia ?-Baseline hemoglobin more than 11. ?-This hospitalization, hemoglobin is running close to 7.  Lowest was 6.5 for which 1 unit of PRBC was given.  Hemoglobin more than 7, it was 7.3 this morning.  No active bleeding.   ?-Currently on Lovenox and Plavix. ?Recent Labs  ?  05/24/21 ?0243 05/25/21 ?0428 05/27/21 ?2440 05/28/21 ?0825 05/29/21 ?1027 05/30/21 ?0135  ?HGB 7.2* 8.0* 7.6* 8.4* 7.0* 7.3*  ?MCV 86.5 85.5 87.6 87.5 86.6 87.3  ?OZDGUYQI34 727  --   --   --   --   --   ?FOLATE  --  14.8  --   --   --   --   ?FERRITIN  --  900*  --   --   --   --   ?TIBC  --  147*  --   --   --   --   ?IRON  --  20*  --   --   --   --   ?RETICCTPCT  --  1.9  --   --   --   --   ? ? ?AKI on CKD IIIa  ?-Baseline creatinine less than 1.5.  Creatinine was elevated to peak of 1.95.  Subsequently improved to normal. ? ?Hypokalemia/hypophosphatemia ?-Low electrolyte level because of poor oral intake.  ?-Labs from this morning with potassium level low at 2.9.  Replace with potassium through PEG tube ?Recent Labs  ?Lab 05/25/21 ?1722 05/26/21 ?7425 05/27/21 ?9563 05/28/21 ?0825 05/29/21 ?8756 05/30/21 ?0135  ?K  --  2.7* 3.4* 3.2* 3.0* 2.9*  ?MG 1.8 1.9 1.8 1.8  --  1.8  ?PHOS 1.8* 1.7* 3.5 2.8  --  2.6  ? ? ?PVD/HLD ?PAD with intermittent claudication on the left LE and asymptomatic carotid stenosis  ?S/p RIGHT TRANSCAROTID ARTERY REVASCULARIZATION 02/2021 ?-Because of low hemoglobin, Plavix was held on 4/14. ?-4/17, resume Plavix.  Continue aspirin and statin ?  ?Goals of care ?  Code Status: Full Code  ? ? ?Mobility: Needs continued PT follow-up unable to follow commands ? ?Skin assessment:  ?Pressure Injury Heel Posterior;Right Stage 1 -  Intact skin with non-blanchable redness of a localized area usually over a bony prominence. (Active)  ?   ?Location: Heel  ?Location Orientation: Posterior;Right  ?Staging: Stage 1 -  Intact skin with non-blanchable redness of a localized area  usually over a bony prominence.  ?Wound Description (Comments):   ?Present on Admission: Yes  ?   ?Pressure Injury 05/22/21 Toe (Comment  which one) Anterior;Left scabed over (Active)  ?05/22/21 0700  ?Location: Toe (Comment  which one) (5th toe)  ?Location Orientation: Anterior;Left  ?Staging:   ?Wound Description (Comments): scabed over  ?Present on Admission:   ? ? ?Nutritional status:  ?Body mass index is 24.17 kg/m?Marland Kitchen  ?Nutrition Problem: Severe Malnutrition ?Etiology: chronic illness (COPD) ?Signs/Symptoms: severe fat depletion, severe muscle depletion ? ? ? ? ?Diet:  ?Diet Order   ? ?       ?  Diet NPO time specified Except for: Ice Chips  Diet effective now       ?  ? ?  ?  ? ?  ? ? ?  DVT prophylaxis:  ?enoxaparin (LOVENOX) injection 40 mg Start: 05/22/21 1000 ?SCDs Start: 05/20/21 0011 ?  ?Antimicrobials: Completed the course ?Fluid: IV fluids stopped ?Consultants: Orthopedics ?Family Communication: Wife not at bedside today. ? ?Status is: Inpatient ? ?Continue in-hospital care because: Improving mental status, underwent PEG tube placement on 4/21. ?Level of care: Med-Surg  ? ?Dispo: The patient is from: Home ?             Anticipated d/c is to: Pending SNF ?             Patient currently is not medically stable to d/c. ?  Difficult to place patient No ? ? ? ? ?Infusions:  ? sodium chloride    ? feeding supplement (OSMOLITE 1.5 CAL) 1,000 mL (05/30/21 0256)  ? ? ?Scheduled Meds: ? sodium chloride   Intravenous Once  ? acetaminophen  1,000 mg Oral Q8H  ? atorvastatin  80 mg Oral QHS  ? chlorhexidine  15 mL Mouth Rinse BID  ? Chlorhexidine Gluconate Cloth  6 each Topical Q0600  ? enoxaparin  40 mg Subcutaneous Q24H  ? feeding supplement (PROSource TF)  45 mL Per Tube BID  ? free water  160 mL Per Tube Q4H  ? mouth rinse  15 mL Mouth Rinse q12n4p  ? melatonin  3 mg Oral QHS  ? mometasone-formoterol  2 puff Inhalation BID  ? nutrition supplement (JUVEN)  1 packet Per Tube BID BM  ? QUEtiapine  50 mg Oral BID  ?  sertraline  200 mg Oral Daily  ? umeclidinium bromide  1 puff Inhalation q AM  ? ? ?PRN meds: ?hydrALAZINE, hydrOXYzine, ipratropium-albuterol, LORazepam, white petrolatum  ? ?Antimicrobials: ?Anti-infectives (From ad

## 2021-05-30 NOTE — Plan of Care (Signed)
  Problem: Clinical Measurements: Goal: Ability to maintain clinical measurements within normal limits will improve Outcome: Progressing Goal: Will remain free from infection Outcome: Progressing Goal: Diagnostic test results will improve Outcome: Progressing Goal: Respiratory complications will improve Outcome: Progressing Goal: Cardiovascular complication will be avoided Outcome: Progressing   Problem: Pain Managment: Goal: General experience of comfort will improve Outcome: Progressing   Problem: Skin Integrity: Goal: Risk for impaired skin integrity will decrease Outcome: Progressing   

## 2021-05-31 DIAGNOSIS — S72001A Fracture of unspecified part of neck of right femur, initial encounter for closed fracture: Secondary | ICD-10-CM | POA: Diagnosis not present

## 2021-05-31 LAB — BASIC METABOLIC PANEL
Anion gap: 4 — ABNORMAL LOW (ref 5–15)
BUN: 25 mg/dL — ABNORMAL HIGH (ref 8–23)
CO2: 24 mmol/L (ref 22–32)
Calcium: 8.4 mg/dL — ABNORMAL LOW (ref 8.9–10.3)
Chloride: 112 mmol/L — ABNORMAL HIGH (ref 98–111)
Creatinine, Ser: 0.82 mg/dL (ref 0.61–1.24)
GFR, Estimated: >60 mL/min (ref >60)
Glucose, Bld: 111 mg/dL — ABNORMAL HIGH (ref 70–99)
Potassium: 3.4 mmol/L — ABNORMAL LOW (ref 3.5–5.1)
Sodium: 140 mmol/L (ref 135–145)

## 2021-05-31 LAB — CBC WITH DIFFERENTIAL/PLATELET
Abs Immature Granulocytes: 0.23 10*3/uL — ABNORMAL HIGH (ref 0.00–0.07)
Basophils Absolute: 0 10*3/uL (ref 0.0–0.1)
Basophils Relative: 0 %
Eosinophils Absolute: 0.4 10*3/uL (ref 0.0–0.5)
Eosinophils Relative: 3 %
HCT: 23.2 % — ABNORMAL LOW (ref 39.0–52.0)
Hemoglobin: 7.1 g/dL — ABNORMAL LOW (ref 13.0–17.0)
Immature Granulocytes: 2 %
Lymphocytes Relative: 10 %
Lymphs Abs: 1.3 10*3/uL (ref 0.7–4.0)
MCH: 26.7 pg (ref 26.0–34.0)
MCHC: 30.6 g/dL (ref 30.0–36.0)
MCV: 87.2 fL (ref 80.0–100.0)
Monocytes Absolute: 0.7 10*3/uL (ref 0.1–1.0)
Monocytes Relative: 5 %
Neutro Abs: 10.5 10*3/uL — ABNORMAL HIGH (ref 1.7–7.7)
Neutrophils Relative %: 80 %
Platelets: 309 10*3/uL (ref 150–400)
RBC: 2.66 MIL/uL — ABNORMAL LOW (ref 4.22–5.81)
RDW: 16.3 % — ABNORMAL HIGH (ref 11.5–15.5)
WBC: 13.1 10*3/uL — ABNORMAL HIGH (ref 4.0–10.5)
nRBC: 0 % (ref 0.0–0.2)

## 2021-05-31 LAB — GLUCOSE, CAPILLARY
Glucose-Capillary: 102 mg/dL — ABNORMAL HIGH (ref 70–99)
Glucose-Capillary: 104 mg/dL — ABNORMAL HIGH (ref 70–99)
Glucose-Capillary: 107 mg/dL — ABNORMAL HIGH (ref 70–99)
Glucose-Capillary: 118 mg/dL — ABNORMAL HIGH (ref 70–99)
Glucose-Capillary: 123 mg/dL — ABNORMAL HIGH (ref 70–99)
Glucose-Capillary: 135 mg/dL — ABNORMAL HIGH (ref 70–99)
Glucose-Capillary: 95 mg/dL (ref 70–99)

## 2021-05-31 LAB — PREPARE RBC (CROSSMATCH)

## 2021-05-31 MED ORDER — PANTOPRAZOLE SODIUM 40 MG PO TBEC
40.0000 mg | DELAYED_RELEASE_TABLET | Freq: Every day | ORAL | Status: DC
  Administered 2021-05-31 – 2021-06-02 (×3): 40 mg via ORAL
  Filled 2021-05-31 (×3): qty 1

## 2021-05-31 MED ORDER — LOPERAMIDE HCL 1 MG/7.5ML PO SUSP
2.0000 mg | Freq: Four times a day (QID) | ORAL | Status: DC | PRN
  Administered 2021-05-31 (×2): 2 mg via ORAL
  Filled 2021-05-31 (×3): qty 15

## 2021-05-31 MED ORDER — POTASSIUM CHLORIDE 20 MEQ PO PACK
40.0000 meq | PACK | Freq: Once | ORAL | Status: AC
  Administered 2021-05-31: 40 meq
  Filled 2021-05-31: qty 2

## 2021-05-31 MED ORDER — SODIUM CHLORIDE 0.9% IV SOLUTION: Freq: Once | INTRAVENOUS | Status: AC

## 2021-05-31 NOTE — Consult Note (Signed)
? ?  [facility] CM Inpatient Consult ? ? ?05/31/2021 ? ?William Kirk ?[birth year] ?[id number] ? ?[facility] Customer service manager [THN]  Occupational hygienist [ACO] Patient: [name] ? ?Primary Care Provider:  Floreen Comber, PA-C, [facility], [place] ? ? ?Patient screened for length of stay hospitalization with noted extreme high risk score for unplanned readmission risk and  to assess for potential [facility]  [[facility]] Care Management service needs for post hospital transition.  Review of patient's medical record reveals patient is being recommended for a skilled nursing facility post hospital stay. ? ? ?Plan:  No current [facility] community needs at this time. ? ?For questions contact:  ? ?Charlesetta Shanks, RN BSN CCM ?[facility] [employer] Liaison ? [phone] business mobile phone ?Toll free office [phone]  ?Fax number: [phone] ?[email or web address] ?[email or web address]  ? ? ? ?

## 2021-05-31 NOTE — Progress Notes (Signed)
Physical Therapy Treatment ?Patient Details ?Name: William Kirk ?MRN: [id number] ?DOB: May 02, 1940 ?Today's Date: 05/31/2021 ? ? ?History of Present Illness 81 y.o. male who presents to the emergency department via EMS from home after sustaining a mechanical fall PTA. periprosthetic right femoral fracture Patient tripped while going up the steps. 4/14 ORIF with replacing the femoral component of previous THA; post-op confusion with hypoxia; required vasopressors;   PMH significant of PVD, COPD, hyperlipidemia, CKD 3A ? ?  ?PT Comments  ? ? Pt received supine and agreeable to session with continued focus on progression of OOB mobility while maintaining WB precautions. Pt min-mod a for bed mobility and min a for tranfers. Pt able to tolerate increased time in standing this session but unable to continuously maintain WB precautions. At start of session pt unable to recall WB precautions or hip precautions. Further gait deferred as pt unable to maintain WB precautions despite max multimodal cueing throughout time in standing. Anticipate difficulty due to mentation and less to strength/pain. Current plan remains appropriate to address deficits and maximize functional independence and decrease caregiver burden. Pt continues to benefit from skilled PT services to progress toward functional mobility goals.   ?  ?Recommendations for follow up therapy are one component of a multi-disciplinary discharge planning process, led by the attending physician.  Recommendations may be updated based on patient status, additional functional criteria and insurance authorization. ? ?Follow Up Recommendations ? Skilled nursing-short term rehab (<3 hours/day) ?  ?  ?Assistance Recommended at Discharge Frequent or constant Supervision/Assistance  ?Patient can return home with the following Two people to help with walking and/or transfers;Assistance with cooking/housework;Two people to help with bathing/dressing/bathroom;Direct  supervision/assist for medications management;Direct supervision/assist for financial management;Assist for transportation;Help with stairs or ramp for entrance ?  ?Equipment Recommendations ? Rolling walker (2 wheels);Wheelchair (measurements PT);Wheelchair cushion (measurements PT);BSC/3in1  ?  ?Recommendations for Other Services OT consult ? ? ?  ?Precautions / Restrictions Precautions ?Precautions: Posterior Hip;Fall ?Precaution Booklet Issued: No ?Precaution Comments: pt cannot verbalize precautions ?Restrictions ?Weight Bearing Restrictions: Yes ?RLE Weight Bearing: Partial weight bearing ?RLE Partial Weight Bearing Percentage or Pounds: 20  ?  ? ?Mobility ? Bed Mobility ?Overal bed mobility: Needs Assistance ?Bed Mobility: Supine to Sit, Sit to Supine ?  ?  ?Supine to sit: Min assist ?Sit to supine: Mod assist ?  ?General bed mobility comments: min a to come to EOB, heavy step by step cueing, pt able to mobilize LEs to EOB and elevate trunk, assist with bedpad to come all the way to endge,  mod a to return BLE into bed and reposition ?  ? ?Transfers ?Overall transfer level: Needs assistance ?Equipment used: Rolling walker (2 wheels) ?Transfers: Sit to/from Stand ?Sit to Stand: Min assist ?  ?  ?  ?  ?  ?General transfer comment: min a to power up to stand, pt pushing from bed this session, cues to elevate trunk, pt able to maintain standing ~5 mins, heavy cueing to compy with WB status ?  ? ?Ambulation/Gait ?  ?  ?  ?  ?  ?  ?  ?General Gait Details: defered this session as pt unable to maintain WB precautions, due more to mentation than weakness/pain ? ? ?Stairs ?  ?  ?  ?  ?  ? ? ?Wheelchair Mobility ?  ? ?Modified Rankin (Stroke Patients Only) ?  ? ? ?  ?Balance Overall balance assessment: History of Falls, Needs assistance ?Sitting-balance support: No upper extremity supported, Feet  supported ?Sitting balance-Leahy Scale: Fair ?  ?  ?Standing balance support: Bilateral upper extremity supported, [place]  on assistive device for balance ?Standing balance-Leahy Scale: Poor ?  ?  ?  ?  ?  ?  ?  ?  ?  ?  ?  ?  ?  ? ?  ?Cognition Arousal/Alertness: Awake/alert ?Behavior During Therapy: Impulsive, Restless ?Overall Cognitive Status: No family/caregiver present to determine baseline cognitive functioning ?  ?  ?  ?  ?  ?  ?  ?  ?  ?  ?  ?  ?  ?  ?  ?  ?General Comments: pt unable to recall precautions or WB status, pleasant affect ?  ?  ? ?  ?Exercises General Exercises - Lower Extremity ?Long Arc Quad: AROM, Right, 10 reps, Seated ?Hip Flexion/Marching: AROM, Right, 10 reps, Standing, Seated ? ?  ?General Comments General comments (skin integrity, edema, etc.): VSS on 2L ?  ?  ? ?Pertinent Vitals/Pain Pain Assessment ?Pain Assessment: Faces ?Faces Pain Scale: Hurts even more ?Pain Location: RLE in weightbearing ?Pain Descriptors / Indicators: Moaning, Operative site guarding ?Pain Intervention(s): Limited activity within patient's tolerance, Monitored during session  ? ? ?Home Living   ?  ?  ?  ?  ?  ?  ?  ?  ?  ?   ?  ?Prior Function    ?  ?  ?   ? ?PT Goals (current goals can now be found in the care plan section) Acute Rehab PT Goals ?PT Goal Formulation: With patient ?Time For Goal Achievement: 06/05/21 ? ?  ?Frequency ? ? ? Min 3X/week ? ? ? ?  ?PT Plan Current plan remains appropriate  ? ? ?Co-evaluation   ?  ?  ?  ?  ? ?  ?AM-PAC PT "6 Clicks" Mobility   ?Outcome Measure ? Help needed turning from your back to your side while in a flat bed without using bedrails?: A Lot ?Help needed moving from lying on your back to sitting on the side of a flat bed without using bedrails?: A Lot ?Help needed moving to and from a bed to a chair (including a wheelchair)?: Total ?Help needed standing up from a chair using your arms (e.g., wheelchair or bedside chair)?: A Lot ?Help needed to walk in hospital room?: A Lot ?Help needed climbing 3-5 steps with a railing? : Total ?6 Click Score: 10 ? ?  ?End of Session Equipment Utilized  During Treatment: Oxygen;Gait belt ?Activity Tolerance: Patient tolerated treatment well (pt limited by confusion) ?Patient left: in bed;with call bell/phone within reach;with bed alarm set;with nursing/sitter in room ?Nurse Communication: Mobility status ?PT Visit Diagnosis: Other abnormalities of gait and mobility (R26.89);History of falling (Z91.81) ?  ? ? ?Time: 9528-4132 ?PT Time Calculation (min) (ACUTE ONLY): 26 min ? ?Charges:  $Gait Training: 23-37 mins          ?          ? ?Lenora Boys. PTA ?Acute Rehabilitation Services ?Office: [phone] ? ? ? ?Marlana Salvage Cashawn Yanko ?05/31/2021, 4:03 PM ? ?

## 2021-05-31 NOTE — TOC Progression Note (Addendum)
Transition of Care (TOC) - Progression Note  ? ? ?Patient Details  ?Name: William Kirk ?MRN: [id number] ?Date of Birth: 23-Dec-1940 ? ?Transition of Care (TOC) CM/SW Contact  ?Joanne Chars, LCSW ?Phone Number: ?05/31/2021, 10:06 AM ? ?Clinical Narrative:   CSW informed by [facility] that they have no beds today, also she cannot take more [employer] pts, will have to rescind offer.  CSW spoke with pt wife [name], who is in pt room, provided other bed offers.  She would like to accept offer at [facility].  [facility] informed.  Waiting on confirmation from [place].  They will need to initiate insurance auth.   ? ?1030: [facility] confirmed, will start [place] at [place].  ? ?Expected Discharge Plan: [place] ?Barriers to Discharge: Continued Medical Work up, SNF Pending bed offer ? ?Expected Discharge Plan and Services ?Expected Discharge Plan: [place] ?In-house Referral: Clinical Social Work ?  ?Post Acute Care Choice: [place] ?Living arrangements for the past 2 months: [place] ?                ?  ?  ?  ?  ?  ?  ?  ?  ?  ?  ? ? ?Social Determinants of Health (SDOH) Interventions ?  ? ?Readmission Risk Interventions ? ?  02/25/2021  ?  2:25 PM 11/08/2018  ? 11:19 AM 10/23/2018  ?  8:44 AM  ?Readmission Risk Prevention Plan  ?Post Dischage Appt Complete    ?Medication Screening Complete    ?Transportation Screening Complete Complete Complete  ?PCP or Specialist Appt within 3-5 Days  Complete Complete  ?[place] or Home Care Consult  Complete Complete  ?Social Work Consult for [place] Planning/Counseling  Complete Complete  ?Palliative Care Screening  Not Applicable Not Applicable  ?Medication Review Press photographer)  Complete Complete  ? ? ?

## 2021-05-31 NOTE — Progress Notes (Addendum)
? ? ? ?  Subjective: ?Very alert today and answering questions and following commands better than he was last week, but remains disoriented. Endorses numbness on the top of the right foot. ? ?Objective:  ? ?VITALS:   ?Vitals:  ? 05/31/21 1130 05/31/21 1145 05/31/21 1200 05/31/21 1227  ?BP: (!) 129/45 (!) 132/56 (!) 129/45 (!) 132/52  ?Pulse: 74 76 80 81  ?Resp: '18 18 18 18  '$ ?Temp: 98 ?F (36.7 ?C) 98.3 ?F (36.8 ?C) 98.7 ?F (37.1 ?C) 98.4 ?F (36.9 ?C)  ?TempSrc: Oral Oral Oral Axillary  ?SpO2: 100% 100% 100%   ?Weight:      ?Height:      ? ? ?States decreased sensation to the dorsal side of the right foot, intact to plantar foot. ?Intact pulses distally ?Plantar flexion intact, but unable to demonstrate active ankle dorsiflexion or EHL function ?Skin checked under prafo, skin intact no sores ?Incision: aquacell dressing c/d/I without strikethrough ? ? ?Lab Results  ?Component Value Date  ? WBC 13.1 (H) 05/31/2021  ? HGB 7.1 (L) 05/31/2021  ? HCT 23.2 (L) 05/31/2021  ? MCV 87.2 05/31/2021  ? PLT 309 05/31/2021  ? ?BMET ?   ?Component Value Date/Time  ? NA 140 05/31/2021 0043  ? K 3.4 (L) 05/31/2021 0043  ? CL 112 (H) 05/31/2021 0043  ? CO2 24 05/31/2021 0043  ? GLUCOSE 111 (H) 05/31/2021 0043  ? BUN 25 (H) 05/31/2021 0043  ? CREATININE 0.82 05/31/2021 0043  ? CALCIUM 8.4 (L) 05/31/2021 0043  ? GFRNONAA >60 05/31/2021 0043  ? ? ?Xray: Postop x-rays demonstrate excellent reduction of the periprosthetic fracture good alignment of hardware no adverse features. ? ?Assessment/Plan: ?10 Days Post-Op  ? ?Principal Problem: ?  Right femoral fracture ([place]) ?Active Problems: ?  Depression ?  Chronic obstructive pulmonary disease (HCC) ?  AKI (acute kidney injury) ([place]) ?  Status post lobectomy of lung ?  Pressure injury of skin ?  Dehydration ?  Leukocytosis ?  Mixed hyperlipidemia ?  Chronic kidney disease, stage 3a ([place]) ?  Acute on chronic respiratory failure with hypoxia and hypercapnia (HCC) ?  Respiratory acidosis ? ?S/p  revision THA for periprosthetic femur fracture 4/14 ? ?Post op foot drop: AFO ordered, likely intraop stretch injury, no significant swelling in the buttock area and not having much pain so low concern for nerve laceration.  patient endorses intact sensation to dorsal foot, but given his altered mental status may not be a reliable exam. Will continue to monitor. ? ?Post op recs: ?WB: 20% PWB RLE, posterior hip precautions x6 weeks ?PT for mobilization ?Imaging: PACU pelvis/femur Xray ?Dressing: prevena vac removed 4/21, changed to aquacell can remain intact 7-10 more days until staple removal. ?DVT prophylaxis: lovenox '40mg'$  daily x 4 weeks (currently being held by medicine team given drop in hgb over the weekend after G-tube placement) plan to resume when hgb stable. ?Follow up: 1 week post discharge for a wound check with Dr. Zachery Dakins at [facility].  ?Address: [address] [place], [place], [place] [address]  ?Office Phone: [phone] ? ? ? ?Irem Stoneham A Janilah Hojnacki ?05/31/2021, 1:17 PM ? ? ?Charlies Constable, MD ? ?Contact information:   ?Weekdays 7am-5pm epic message Dr. Zachery Dakins, or call office for patient follow up: [phone] ?After hours and holidays please check [email or web address] for group call information for [facility] ? ?  ?

## 2021-05-31 NOTE — Progress Notes (Signed)
?PROGRESS NOTE ? ?William Kirk  ?DOB: 08-10-1940  ?PCP: Floreen Comber, PA-C ?ZOX:[id number]  ?DOA: 05/19/2021 ? LOS: 12 days  ?Hospital Day: 13 ? ?Brief narrative: ?William Kirk is a 81 y.o. male with PMH significant for HTN, PAD, CKD 3, COPD, dysphagia, bronchiectasis, right lower lobe lobectomy in 2020  due to nonfunctional RLL from multiple aspiration pneumonia. ?Patient presented to the ED on 4/12 after he tripped while going up the steps at home. ?He was found to have right periprosthetic hip fracture, s/p ORIF on 4/14. ?  ?Preoperatively he had desaturations into the 80s on the floor, and was found to be in the 60s on preop.  He was intubated preoperatively due to hypoxia.  He had a prolonged surgery about 4hrs but was able to be extubated in the PACU.  He had prolonged time to wake up.  ABG with mild acute hypercapnia.  He was transferred to icu post op due to  altered mental status, hypoxia, hypercapnia. He required vasopressors briefly , weaned off levophed on 4/15 am. He also required NRB while in the ICU, now on [facility].  He was transferred to [facility] service on 4/16. ?Currently remains altered and is on restraints. ? ?Subjective: ?Patient was seen and examined this morning.  ?Lying on bed.  Not in distress ?Alert, awake, oriented to place and person.  Not restless or agitated.  On mittens today.  Off wrist restraints since yesterday. ?PEG tube feeding ongoing. ? ? ? Principal Problem: ?  Right femoral fracture (HCC) ?Active Problems: ?  Depression ?  Chronic obstructive pulmonary disease (HCC) ?  AKI (acute kidney injury) (HCC) ?  Status post lobectomy of lung ?  Pressure injury of skin ?  Dehydration ?  Leukocytosis ?  Mixed hyperlipidemia ?  Chronic kidney disease, stage 3a (HCC) ?  Acute on chronic respiratory failure with hypoxia and hypercapnia (HCC) ?  Respiratory acidosis ?  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  Mental status gradually improving.  Off restraints since yesterday morning. ?-Continue Zoloft 200 mg daily, Seroquel 25 mg twice daily and hydroxyzine 10 mg 3 times daily as needed through PEG tube.   ?-Continue as needed Haldol.  QTc less than 400 ms. ? ?Right Periprosthetic Hip Fracture ?-Secondary to mechanical fall  ?-4/14, s/p revision THA for periprosthetic femur fracture ?-Per orthopedics, 20% partial weightbearing on the right lower extremity, posterior hip precautions for 6 weeks.  DVT prophylaxis with Lovenox 40 mg daily.  Currently not on any scheduled pain medicines.  Add Tylenol scheduled. ? ?Post op foot drop ?-PRAFO boot in place. ?-post operative care per Ortho, details please see ortho note on 4/16 ? ?Septic shock secondary to aspiration pneumonia - not POA ?Acute hypoxic hypercapnic respiratory failure ?-Was on pressors briefly in the ICU, now off pressors, blood pressure stable  ?-414, CXR with right-sided pneumonia/atelectasis ?-Intubated preoperatively, was able to extubate postop, briefly required NRB, subsequently weaned down.  Currently not on supplemental oxygen. ?-Completed a course of antibiotics. ? ?Chronic dysphagia ?Chronic aspiration ?h/o bronchiectasis and RLL lobectomy ?-Aspiration precautions ?-speech eval obtained.  Patient was noted to have significant gross aspiration with any kind of food.  N.p.o. was recommended.  It seems patient had PEG tube in the  past in 2022 which was eventually removed.  Currently has NG tube feeding on hold.  Underwent PEG tube placement by IR on 4/21. ?-Continue PEG tube feeding. ? ?Severe protein calorie malnutrition ?-Dietitian consul appreciated.  Currently on tube feeding ?  ?COPD w/o Exacerbation,  ?-Continue home Incruse  and Dulera ?    ?Acute on chronic anemia ?-Baseline hemoglobin more than 11. ?-This hospitalization, hemoglobin is running close to 7.  Lowest was 6.5 for which 1 unit of PRBC was given.  Hemoglobin 7.1 today.  Patient is currently on both Lovenox and Plavix.  Hold Lovenox.  Continue Plavix as he recently had intervention. ?-I discussed with his wife this morning and decided to give 1 unit of PRBC today. ?-Also started on Protonix. ?Recent Labs  ?  05/24/21 ?0243 05/25/21 ?0428 05/27/21 ?0865 05/28/21 ?0825 05/29/21 ?7846 05/30/21 ?0135 05/31/21 ?9629  ?HGB 7.2* 8.0* 7.6* 8.4* 7.0* 7.3* 7.1*  ?MCV 86.5 85.5 87.6 87.5 86.6 87.3 87.2  ?[id number] 727  --   --   --   --   --   --   ?FOLATE  --  14.8  --   --   --   --   --   ?FERRITIN  --  900*  --   --   --   --   --   ?TIBC  --  147*  --   --   --   --   --   ?IRON  --  20*  --   --   --   --   --   ?RETICCTPCT  --  1.9  --   --   --   --   --   ? ?AKI on CKD IIIa  ?-Baseline creatinine less than 1.5.  Creatinine was elevated to peak of 1.95.  Subsequently improved to normal. ? ?Hypokalemia/hypophosphatemia ?-Low electrolyte level because of poor oral intake.  ?-Labs from this morning with potassium level at 3.4 which is better than yesterday.  Give replacement through PEG tube. ?Recent Labs  ?Lab 05/25/21 ?1722 05/26/21 ?1027 05/27/21 ?2536 05/28/21 ?0825 05/29/21 ?6440 05/30/21 ?0135 05/31/21 ?3474  ?K  --  2.7* 3.4* 3.2* 3.0* 2.9* 3.4*  ?MG 1.8 1.9 1.8 1.8  --  1.8  --   ?PHOS 1.8* 1.7* 3.5 2.8  --  2.6  --   ? ?PVD/HLD ?PAD with intermittent claudication on the left LE and asymptomatic carotid stenosis  ?S/p RIGHT TRANSCAROTID ARTERY REVASCULARIZATION 02/2021 ?-Because of low hemoglobin, Plavix was held on 4/14. ?-4/17, resume Plavix.  Continue aspirin and statin ?  ?Goals of care ?  Code Status: Full Code  ? ? ?Mobility: Needs continued PT follow-up unable to follow commands ? ?Skin assessment:  ?Pressure Injury Heel Posterior;Right Stage 1 -  Intact skin with  non-blanchable redness of a localized area usually over a bony prominence. (Active)  ?   ?Location: Heel  ?Location Orientation: Posterior;Right  ?Staging: Stage 1 -  Intact skin with non-blanchable redness of a localized area usually over a bony prominence.  ?Wound Description (Comments):   ?Present on Admission: Yes  ?   ?Pressure Injury 05/22/21 Toe (Comment  which one) Anterior;Left scabed over (Active)  ?05/22/21 0700  ?Location: Toe (Comment  which one) (5th toe)  ?Location Orientation: Anterior;Left  ?Staging:   ?Wound Description (Comments): scabed over  ?Present on Admission:   ? ? ?Nutritional status:  ?Body mass index is 24.17 kg/m?Marland Kitchen  ?Nutrition Problem: Severe Malnutrition ?Etiology: chronic illness (  COPD) ?Signs/Symptoms: severe fat depletion, severe muscle depletion ? ? ? ? ?Diet:  ?Diet Order   ? ?       ?  Diet NPO time specified Except for: Ice Chips  Diet effective now       ?  ? ?  ?  ? ?  ? ? ?DVT prophylaxis:  ?Place and maintain sequential compression device Start: 05/31/21 1306 ?SCDs Start: 05/20/21 0011 ?  ?Antimicrobials: Completed the course ?Fluid: Not on IV fluid. ?Consultants: Orthopedics ?Family Communication: Wife at bedside today. ? ?Status is: Inpatient ? ?Continue in-hospital care because: Improving mental status, underwent PEG tube placement on 4/21.  ?Level of care: Med-Surg  ? ?Dispo: The patient is from: Home ?             Anticipated d/c is to: Pending SNF ?             Patient currently is medically stable to d/c. ?  Difficult to place patient No ? ? ? ? ?Infusions:  ? sodium chloride    ? feeding supplement (OSMOLITE 1.5 CAL) 1,000 mL (05/31/21 0530)  ? ? ?Scheduled Meds: ? sodium chloride   Intravenous Once  ? acetaminophen  1,000 mg Oral Q8H  ? atorvastatin  80 mg Oral QHS  ? chlorhexidine  15 mL Mouth Rinse BID  ? Chlorhexidine Gluconate Cloth  6 each Topical Q0600  ? feeding supplement (PROSource TF)  45 mL Per Tube BID  ? free water  160 mL Per Tube Q4H  ? mouth rinse   15 mL Mouth Rinse q12n4p  ? melatonin  3 mg Oral QHS  ? mometasone-formoterol  2 puff Inhalation BID  ? nutrition supplement (JUVEN)  1 packet Per Tube BID BM  ? pantoprazole  40 mg Oral Daily  ? potassium c

## 2021-06-01 DIAGNOSIS — S72001A Fracture of unspecified part of neck of right femur, initial encounter for closed fracture: Secondary | ICD-10-CM | POA: Diagnosis not present

## 2021-06-01 LAB — CBC WITH DIFFERENTIAL/PLATELET
Abs Immature Granulocytes: 0.18 10*3/uL — ABNORMAL HIGH (ref 0.00–0.07)
Basophils Absolute: 0 10*3/uL (ref 0.0–0.1)
Basophils Relative: 0 %
Eosinophils Absolute: 0.4 10*3/uL (ref 0.0–0.5)
Eosinophils Relative: 3 %
HCT: 27.1 % — ABNORMAL LOW (ref 39.0–52.0)
Hemoglobin: 8.6 g/dL — ABNORMAL LOW (ref 13.0–17.0)
Immature Granulocytes: 1 %
Lymphocytes Relative: 12 %
Lymphs Abs: 1.6 10*3/uL (ref 0.7–4.0)
MCH: 27.9 pg (ref 26.0–34.0)
MCHC: 31.7 g/dL (ref 30.0–36.0)
MCV: 88 fL (ref 80.0–100.0)
Monocytes Absolute: 0.6 10*3/uL (ref 0.1–1.0)
Monocytes Relative: 5 %
Neutro Abs: 10.5 10*3/uL — ABNORMAL HIGH (ref 1.7–7.7)
Neutrophils Relative %: 79 %
Platelets: 355 10*3/uL (ref 150–400)
RBC: 3.08 MIL/uL — ABNORMAL LOW (ref 4.22–5.81)
RDW: 16.3 % — ABNORMAL HIGH (ref 11.5–15.5)
WBC: 13.3 10*3/uL — ABNORMAL HIGH (ref 4.0–10.5)
nRBC: 0 % (ref 0.0–0.2)

## 2021-06-01 LAB — GLUCOSE, CAPILLARY
Glucose-Capillary: 97 mg/dL (ref 70–99)
Glucose-Capillary: 110 mg/dL — ABNORMAL HIGH (ref 70–99)
Glucose-Capillary: 98 mg/dL (ref 70–99)
Glucose-Capillary: 110 mg/dL — ABNORMAL HIGH (ref 70–99)
Glucose-Capillary: 108 mg/dL — ABNORMAL HIGH (ref 70–99)

## 2021-06-01 LAB — BASIC METABOLIC PANEL
Anion gap: 3 — ABNORMAL LOW (ref 5–15)
BUN: 33 mg/dL — ABNORMAL HIGH (ref 8–23)
CO2: 28 mmol/L (ref 22–32)
Calcium: 8.5 mg/dL — ABNORMAL LOW (ref 8.9–10.3)
Chloride: 108 mmol/L (ref 98–111)
Creatinine, Ser: 0.87 mg/dL (ref 0.61–1.24)
GFR, Estimated: >60 mL/min (ref >60)
Glucose, Bld: 105 mg/dL — ABNORMAL HIGH (ref 70–99)
Potassium: 4.2 mmol/L (ref 3.5–5.1)
Sodium: 139 mmol/L (ref 135–145)

## 2021-06-01 LAB — BPAM RBC
Blood Product Expiration Date: 202305262359
ISSUE DATE / TIME: 202304241115
Unit Type and Rh: 5100

## 2021-06-01 LAB — TYPE AND SCREEN
ABO/RH(D): O POS
Antibody Screen: NEGATIVE
Unit division: 0

## 2021-06-01 MED ORDER — JUVEN PO PACK
1.0000 | PACK | Freq: Two times a day (BID) | ORAL | 0 refills | Status: AC
Start: 2021-06-01 — End: ?

## 2021-06-01 MED ORDER — CLOPIDOGREL BISULFATE 75 MG PO TABS
75.0000 mg | ORAL_TABLET | Freq: Every day | ORAL | Status: DC
  Administered 2021-06-01 – 2021-06-02 (×2): 75 mg via ORAL
  Filled 2021-06-01 (×2): qty 1

## 2021-06-01 MED ORDER — OXYCODONE-ACETAMINOPHEN 5-325 MG PO TABS: 1.0000 | ORAL_TABLET | Freq: Four times a day (QID) | ORAL | 0 refills | Status: AC | PRN

## 2021-06-01 MED ORDER — ENOXAPARIN SODIUM 40 MG/0.4ML IJ SOSY
40.0000 mg | PREFILLED_SYRINGE | Freq: Every day | INTRAMUSCULAR | Status: DC
  Administered 2021-06-02: 40 mg via SUBCUTANEOUS
  Filled 2021-06-01: qty 0.4

## 2021-06-01 MED ORDER — MELATONIN 3 MG PO TABS
3.0000 mg | ORAL_TABLET | Freq: Every day | ORAL | 0 refills | Status: AC
Start: 2021-06-01 — End: ?

## 2021-06-01 MED ORDER — QUETIAPINE FUMARATE 50 MG PO TABS: 50.0000 mg | ORAL_TABLET | Freq: Two times a day (BID) | ORAL | Status: AC

## 2021-06-01 MED ORDER — OSMOLITE 1.5 CAL PO LIQD: 1000.0000 mL | ORAL | 0 refills | Status: AC

## 2021-06-01 MED ORDER — HYDROCODONE-ACETAMINOPHEN 5-325 MG PO TABS
1.0000 | ORAL_TABLET | Freq: Four times a day (QID) | ORAL | Status: AC
  Administered 2021-06-01 – 2021-06-02 (×4): 1 via ORAL
  Filled 2021-06-01 (×4): qty 1

## 2021-06-01 MED ORDER — PROSOURCE TF PO LIQD
45.0000 mL | Freq: Two times a day (BID) | ORAL | Status: AC
Start: 2021-06-01 — End: ?

## 2021-06-01 MED ORDER — LOPERAMIDE HCL 1 MG/7.5ML PO SUSP: 2.0000 mg | Freq: Four times a day (QID) | ORAL | 0 refills | Status: AC | PRN

## 2021-06-01 MED ORDER — ACETAMINOPHEN 500 MG PO TABS: 1000.0000 mg | ORAL_TABLET | Freq: Four times a day (QID) | ORAL | Status: DC | PRN

## 2021-06-01 MED ORDER — PANTOPRAZOLE SODIUM 40 MG PO TBEC
40.0000 mg | DELAYED_RELEASE_TABLET | Freq: Every day | ORAL | Status: AC
Start: 2021-06-01 — End: ?

## 2021-06-01 MED ORDER — FREE WATER
160.0000 mL | Status: DC
Start: 2021-06-01 — End: 2021-06-16

## 2021-06-01 NOTE — Progress Notes (Signed)
RT note- patient with productive cough, thick tan green. ?

## 2021-06-01 NOTE — Progress Notes (Signed)
?  Speech Language Pathology Treatment: Dysphagia  ?Patient Details ?Name: William Kirk ?MRN: [id number] ?DOB: 1941/01/15 ?Today's Date: 06/01/2021 ?Time: 4098-1191 ?SLP Time Calculation (min) (ACUTE ONLY): 14 min ? ?Assessment / Plan / Recommendation ?Clinical Impression ? Pt seen for dysphagia therapy with respiratory muscle strength trainer set at various settings for optimal resistance. He has improved from last session where EMST was set at 10 cm H20. Trainer was increased to 20, 25 then 35 due to ease of performance. 35 cm H20 pt stated exertion level was 6/10 and demonstrated mildly increased work of breathing. Pt attempted to perform Masako maneuver was unsuccessful in keeping tongue protruded while swallowing (dry swallow). Pitch variation with high pitched /e/ was achieved effectively.  Pt received PEG Saturday and is scheduled for possible discharge tomorrow. Recommend continue Speech therapy and will need repeat instrumental study when appropriate for possible return to po's  ?  ?HPI HPI: William Kirk is a 81 y.o. male with medical history significant of PVD, COPD, hyperlipidemia, CKD 3A who presents to the emergency department via EMS from home after sustaining a mechanical fall PTA.  Patient tripped while going up the steps at home, he fell backwards landing on his right hip, he denies hitting his head and denies LOC.  He complained of right hip pain and difficulty in being able to bear weight on affected leg, EMS was activated and fentanyl was given prior to arrival.  He denies fever, chills, headache, blurry vision, chest pain, shortness of breath, abdominal pain.  CHest xray is showing patchy opacities in the right upper and lower lung zones that appears unchanged from exam yesterday that was concerning for pneumonia. ?  ?   ?SLP Plan ? Continue with current plan of care ? ?  ?  ?Recommendations for follow up therapy are one component of a multi-disciplinary discharge planning process, led by  the attending physician.  Recommendations may be updated based on patient status, additional functional criteria and insurance authorization. ?  ? ?Recommendations  ?Diet recommendations: NPO ?Medication Administration: Via alternative means  ?   ?    ?   ? ? ? ? Oral Care Recommendations: Oral care BID ?Follow Up Recommendations: Skilled nursing-short term rehab (<3 hours/day) ?Assistance recommended at discharge: Intermittent Supervision/Assistance ?SLP Visit Diagnosis: Dysphagia, pharyngeal phase (R13.13) ?Plan: Continue with current plan of care ? ? ? ? ?  ?  ? ? ?Royce Macadamia ? ?06/01/2021, 3:18 PM ?

## 2021-06-01 NOTE — Progress Notes (Signed)
?PROGRESS NOTE ? ?William Kirk  ?DOB: March 19, 1940  ?PCP: Floreen Comber, PA-C ?WJX:[id number]  ?DOA: 05/19/2021 ? LOS: 13 days  ?Hospital Day: 14 ? ?Brief narrative: ?William Kirk is a 81 y.o. male with PMH significant for HTN, PAD, CKD 3, COPD, dysphagia, bronchiectasis, right lower lobe lobectomy in 2020  due to nonfunctional RLL from multiple aspiration pneumonia. ?Patient presented to the ED on 4/12 after he tripped while going up the steps at home. ?He was found to have right periprosthetic hip fracture. ?Admitted to hospitalist service.  Orthopedics was consulted.  S/p ORIF on 4/14. ? ?Preoperatively he had desaturations into the 80s on the floor, and was found to be in the 60s on preop evaluation.  He was intubated preoperatively due to hypoxia.  He had a prolonged surgery about 4hrs but was able to be extubated in the PACU.  He had prolonged time to wake up.  ABG with mild acute hypercapnia.  He was transferred to icu post op due to  altered mental status, hypoxia, hypercapnia. He required vasopressors briefly , weaned off levophed on 4/15 am. He also required NRB while in the ICU, subsequently weaned down to low-flow by nasal cannula.Marland Kitchen  He was transferred to [facility] service on 4/16. ? ?His hospitalization was prolonged due to persistent altered mental status requiring restraints. ?Off restraints since 4/23. ? ?Subjective: ?Patient was seen and examined this morning.  ?He was experiencing a lot of pain on right hip. ?Wife at bedside. ?Unable to discharge today because of uncontrolled pain. ? ? Principal Problem: ?  Right femoral fracture (HCC) ?Active Problems: ?  Depression ?  Chronic obstructive pulmonary disease (HCC) ?  AKI (acute kidney injury) (HCC) ?  Status post lobectomy of lung ?  Pressure injury of skin ?  Dehydration ?  Leukocytosis ?  Mixed hyperlipidemia ?  Chronic kidney disease, stage 3a (HCC) ?  Acute on chronic respiratory failure with hypoxia and hypercapnia (HCC) ?  Respiratory  acidosis ?  ?Assessment and Plan: ?Right Periprosthetic Hip Fracture ?-Secondary to mechanical fall  ?-4/14, s/p revision THA for periprosthetic femur fracture ?-Per orthopedics, 20% partial weightbearing on the right lower extremity, posterior hip precautions for 6 weeks.  DVT prophylaxis with Lovenox 40 mg daily for total of 4 weeks.  ?-Patient is currently on scheduled Tylenol for pain.  With excruciating pain this morning on movement, I will put him on scheduled Percocet for next 24 hours.  Change Tylenol to as needed.  If pain control is better tomorrow, can consider discharge to SNF. ? ?Post op foot drop ?-PRAFO boot in place. ?-post operative care per Ortho, details please see ortho note on 4/16 ? ?Acute Metabolic Encephalopathy  ?-During this hospitalization, patient had episodes of disorientation, restlessness and required restraints.  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 ?-Since he has PEG tube in place, he has been able to get Zoloft, Seroquel and hydroxyzine consistently.  Mental status gradually improving.  Off restraints since 4/23. ?-Continue Zoloft 200 mg daily, Seroquel 25 mg twice daily and hydroxyzine 10 mg 3 times daily as needed through PEG tube.   ?-Continue as needed Haldol.  QTc less than 400 ms. ? ?Septic shock secondary to aspiration pneumonia - not POA ?Acute hypoxic hypercapnic respiratory failure ?-Was on pressors briefly in the ICU, subsequently hemodynamically improved and taken off of pressors.   ?-Intubated preoperatively, was able to extubate postop, briefly required NRB, subsequently weaned down.  Currently not on supplemental oxygen. ?-4/14, CXR with right-sided pneumonia/atelectasis. Completed a course of antibiotics. ? ?Chronic dysphagia ?Chronic aspiration ?h/o bronchiectasis and RLL lobectomy ?-Aspiration precautions ?-speech eval obtained.  Patient was noted to have significant gross aspiration with any  kind of food.  N.p.o. was recommended.  Per family, patient had PEG tube in the past in 2022 which was eventually removed.  ?-Because of dysphagia, patient was started on NGT feeding.  Subsequently underwent PEG tube inserted by IR on 4/21. ?-Continue PEG tube feeding. ? ?Severe protein calorie malnutrition ?-Dietitian consult appreciated.  Currently on tube feeding ?  ?COPD w/o Exacerbation,  ?-Continue home Incruse and Dulera ?    ?Acute on chronic anemia ?-Baseline hemoglobin more than 11. ?-This hospitalization, hemoglobin is running close to 7.  Lowest was 6.5 for which 1 unit of PRBC was given.  Hemoglobin 8.6 today.  Continue Plavix.  Resume Lovenox subcu for DVT prophylaxis for 4 weeks- 2 more weeks is to complete. ?-1 unit of PRBC was given on 4/24.   ?-Continue Protonix. ?Recent Labs  ?  05/24/21 ?0243 05/25/21 ?0428 05/27/21 ?8657 05/28/21 ?0825 05/29/21 ?8469 05/30/21 ?0135 05/31/21 ?6295 06/01/21 ?0231  ?HGB 7.2* 8.0*   < > 8.4* 7.0* 7.3* 7.1* 8.6*  ?MCV 86.5 85.5   < > 87.5 86.6 87.3 87.2 88.0  ?[id number] 727  --   --   --   --   --   --   --   ?FOLATE  --  14.8  --   --   --   --   --   --   ?FERRITIN  --  900*  --   --   --   --   --   --   ?TIBC  --  147*  --   --   --   --   --   --   ?IRON  --  20*  --   --   --   --   --   --   ?RETICCTPCT  --  1.9  --   --   --   --   --   --   ? < > = values in this interval not displayed.  ? ?AKI on CKD IIIa  ?-Baseline creatinine less than 1.5.  Creatinine was elevated to peak of 1.95.  Subsequently improved to normal. ? ?PVD/HLD ?PAD with intermittent claudication on the left LE and asymptomatic carotid stenosis  ?S/p RIGHT TRANSCAROTID ARTERY REVASCULARIZATION 02/2021 ?-Because of low hemoglobin, Plavix was held on 4/14.  Subsequently resumed. ?-Continue aspirin, Plavix and statin ?  ?Goals of care ?  Code Status: Full Code  ? ? ?Mobility: Needs continued PT follow-up unable to follow commands ? ?Skin assessment:  ?Pressure Injury Heel Posterior;Right Stage  1 -  Intact skin with non-blanchable redness of a localized area usually over a bony prominence. (Active)  ?   ?Location: Heel  ?Location Orientation: Posterior;Right  ?Staging: Stage 1 -  Intact skin with non-blanchable redness of a localized area usually over a bony prominence.  ?Wound Description (Comments):   ?Present on Admission: Yes  ?   ?Pressure Injury 05/22/21 Toe (Comment  which one) Anterior;Left scabed over (Active)  ?05/22/21 0700  ?Location: Toe (Comment  which one) (5th toe)  ?Location Orientation: Anterior;Left  ?Staging:   ?Wound Description (Comments): scabed over  ?Present on Admission:   ? ? ?Nutritional status:  ?Body mass index is 24.17 kg/m?Marland Kitchen  ?Nutrition Problem: Severe Malnutrition ?  Etiology: chronic illness (COPD) ?Signs/Symptoms: severe fat depletion, severe muscle depletion ? ? ? ? ?Diet:  ?Diet Order   ? ?       ?  Diet NPO time specified Except for: Ice Chips  Diet effective now       ?  ? ?  ?  ? ?  ? ? ?DVT prophylaxis:  ?enoxaparin (LOVENOX) injection 40 mg Start: 06/02/21 0600 ?Place and maintain sequential compression device Start: 05/31/21 1306 ?SCDs Start: 05/20/21 0011 ?  ?Antimicrobials: Completed the course ?Fluid: Not on IV fluid. ?Consultants: Orthopedics ?Family Communication: Wife at bedside today. ? ?Status is: Inpatient ? ?Continue in-hospital care because: Unable to discharge today because of severe pain in the right extremity ?Level of care: Med-Surg  ? ?Dispo: The patient is from: Home ?             Anticipated d/c is to: Pending SNF.  Hopefully SNF tomorrow. ?             Patient currently is not medically stable to d/c. ?  Difficult to place patient No ? ? ? ? ?Infusions:  ? sodium chloride    ? feeding supplement (OSMOLITE 1.5 CAL) 1,000 mL (05/31/21 2155)  ? ? ?Scheduled Meds: ? sodium chloride   Intravenous Once  ? atorvastatin  80 mg Oral QHS  ? chlorhexidine  15 mL Mouth Rinse BID  ? Chlorhexidine Gluconate Cloth  6 each Topical Q0600  ? clopidogrel  75 mg Oral  Daily  ? [START ON 06/02/2021] enoxaparin (LOVENOX) injection  40 mg Subcutaneous Q0600  ? feeding supplement (PROSource TF)  45 mL Per Tube BID  ? free water  160 mL Per Tube Q4H  ? HYDROcodone-acetamin

## 2021-06-01 NOTE — Plan of Care (Signed)

## 2021-06-01 NOTE — TOC Progression Note (Signed)
Transition of Care (TOC) - Progression Note  ? ? ?Patient Details  ?Name: William Kirk ?MRN: [id number] ?Date of Birth: 1940-03-31 ? ?Transition of Care (TOC) CM/SW Contact  ?Joanne Chars, LCSW ?Phone Number: ?06/01/2021, 1:44 PM ? ?Clinical Narrative:   CSW informed by [facility] that SN[facility] has been approved. Per MD, plan for DC tomorrow.  ? ? ? ?Expected Discharge Plan: [place] ?Barriers to Discharge: Continued Medical Work up, SNF Pending bed offer ? ?Expected Discharge Plan and Services ?Expected Discharge Plan: [place] ?In-house Referral: Clinical Social Work ?  ?Post Acute Care Choice: [place] ?Living arrangements for the past 2 months: [place] ?                ?  ?  ?  ?  ?  ?  ?  ?  ?  ?  ? ? ?Social Determinants of Health (SDOH) Interventions ?  ? ?Readmission Risk Interventions ? ?  02/25/2021  ?  2:25 PM 11/08/2018  ? 11:19 AM 10/23/2018  ?  8:44 AM  ?Readmission Risk Prevention Plan  ?Post Dischage Appt Complete    ?Medication Screening Complete    ?Transportation Screening Complete Complete Complete  ?PCP or Specialist Appt within 3-5 Days  Complete Complete  ?[place] or Home Care Consult  Complete Complete  ?Social Work Consult for [place] Planning/Counseling  Complete Complete  ?Palliative Care Screening  Not Applicable Not Applicable  ?Medication Review [employer] photographer)  Complete Complete  ? ? ?

## 2021-06-02 DIAGNOSIS — R262 Difficulty in walking, not elsewhere classified: Secondary | ICD-10-CM | POA: Diagnosis not present

## 2021-06-02 DIAGNOSIS — F330 Major depressive disorder, recurrent, mild: Secondary | ICD-10-CM | POA: Diagnosis not present

## 2021-06-02 DIAGNOSIS — F4310 Post-traumatic stress disorder, unspecified: Secondary | ICD-10-CM | POA: Diagnosis not present

## 2021-06-02 DIAGNOSIS — Z8709 Personal history of other diseases of the respiratory system: Secondary | ICD-10-CM | POA: Diagnosis not present

## 2021-06-02 DIAGNOSIS — S72001A Fracture of unspecified part of neck of right femur, initial encounter for closed fracture: Secondary | ICD-10-CM | POA: Diagnosis not present

## 2021-06-02 DIAGNOSIS — M9701XA Periprosthetic fracture around internal prosthetic right hip joint, initial encounter: Secondary | ICD-10-CM | POA: Diagnosis not present

## 2021-06-02 DIAGNOSIS — R1310 Dysphagia, unspecified: Secondary | ICD-10-CM | POA: Diagnosis not present

## 2021-06-02 DIAGNOSIS — J9621 Acute and chronic respiratory failure with hypoxia: Secondary | ICD-10-CM | POA: Diagnosis not present

## 2021-06-02 DIAGNOSIS — R2681 Unsteadiness on feet: Secondary | ICD-10-CM | POA: Diagnosis not present

## 2021-06-02 DIAGNOSIS — Z743 Need for continuous supervision: Secondary | ICD-10-CM | POA: Diagnosis not present

## 2021-06-02 DIAGNOSIS — M21371 Foot drop, right foot: Secondary | ICD-10-CM | POA: Diagnosis not present

## 2021-06-02 DIAGNOSIS — J449 Chronic obstructive pulmonary disease, unspecified: Secondary | ICD-10-CM | POA: Diagnosis not present

## 2021-06-02 DIAGNOSIS — N179 Acute kidney failure, unspecified: Secondary | ICD-10-CM | POA: Diagnosis not present

## 2021-06-02 DIAGNOSIS — S7291XD Unspecified fracture of right femur, subsequent encounter for closed fracture with routine healing: Secondary | ICD-10-CM | POA: Diagnosis not present

## 2021-06-02 DIAGNOSIS — Z8669 Personal history of other diseases of the nervous system and sense organs: Secondary | ICD-10-CM | POA: Diagnosis not present

## 2021-06-02 DIAGNOSIS — E8729 Other acidosis: Secondary | ICD-10-CM

## 2021-06-02 DIAGNOSIS — R279 Unspecified lack of coordination: Secondary | ICD-10-CM | POA: Diagnosis not present

## 2021-06-02 DIAGNOSIS — Z9181 History of falling: Secondary | ICD-10-CM | POA: Diagnosis not present

## 2021-06-02 DIAGNOSIS — F5101 Primary insomnia: Secondary | ICD-10-CM | POA: Diagnosis not present

## 2021-06-02 DIAGNOSIS — Z87898 Personal history of other specified conditions: Secondary | ICD-10-CM | POA: Diagnosis not present

## 2021-06-02 DIAGNOSIS — E46 Unspecified protein-calorie malnutrition: Secondary | ICD-10-CM | POA: Diagnosis not present

## 2021-06-02 DIAGNOSIS — E43 Unspecified severe protein-calorie malnutrition: Secondary | ICD-10-CM | POA: Diagnosis not present

## 2021-06-02 DIAGNOSIS — M6281 Muscle weakness (generalized): Secondary | ICD-10-CM | POA: Diagnosis not present

## 2021-06-02 DIAGNOSIS — M93971 Osteochondropathy, unspecified, right ankle and foot: Secondary | ICD-10-CM | POA: Diagnosis not present

## 2021-06-02 DIAGNOSIS — J438 Other emphysema: Secondary | ICD-10-CM | POA: Diagnosis not present

## 2021-06-02 DIAGNOSIS — R1312 Dysphagia, oropharyngeal phase: Secondary | ICD-10-CM | POA: Diagnosis not present

## 2021-06-02 DIAGNOSIS — G3184 Mild cognitive impairment, so stated: Secondary | ICD-10-CM | POA: Diagnosis not present

## 2021-06-02 DIAGNOSIS — Z931 Gastrostomy status: Secondary | ICD-10-CM | POA: Diagnosis not present

## 2021-06-02 DIAGNOSIS — R2689 Other abnormalities of gait and mobility: Secondary | ICD-10-CM | POA: Diagnosis not present

## 2021-06-02 DIAGNOSIS — R41841 Cognitive communication deficit: Secondary | ICD-10-CM | POA: Diagnosis not present

## 2021-06-02 DIAGNOSIS — I739 Peripheral vascular disease, unspecified: Secondary | ICD-10-CM | POA: Diagnosis not present

## 2021-06-02 LAB — GLUCOSE, CAPILLARY
Glucose-Capillary: 100 mg/dL — ABNORMAL HIGH (ref 70–99)
Glucose-Capillary: 115 mg/dL — ABNORMAL HIGH (ref 70–99)
Glucose-Capillary: 116 mg/dL — ABNORMAL HIGH (ref 70–99)
Glucose-Capillary: 91 mg/dL (ref 70–99)
Glucose-Capillary: 93 mg/dL (ref 70–99)

## 2021-06-02 MED ORDER — ENOXAPARIN SODIUM 40 MG/0.4ML IJ SOSY: 40.0000 mg | PREFILLED_SYRINGE | Freq: Every day | INTRAMUSCULAR | Status: DC

## 2021-06-02 NOTE — Discharge Summary (Addendum)
?Physician Discharge Summary ?  ?Patient: William Kirk MRN: [id number] DOB: 08/31/1940  ?Admit date:     05/19/2021  ?Discharge date: 06/02/21  ?Discharge Physician: William Kirk  ? ?PCP: William Comber, PA-C  ? ?Recommendations at discharge:  ?Follow up with orthopedics, William Kirk at [facility] orthopedics in 1 week for wound recheck. continue aquacell dressing 7-10 more days until staple removal.  ?Continue tube feeds and SLP evaluations, routine PEG maintenance.  ? ?Discharge Diagnoses: ?Principal Problem: ?  Right femoral fracture (HCC) ?Active Problems: ?  Depression ?  Chronic obstructive pulmonary disease (HCC) ?  AKI (acute kidney injury) (HCC) ?  Status post lobectomy of lung ?  Pressure injury of skin ?  Dehydration ?  Leukocytosis ?  Mixed hyperlipidemia ?  Chronic kidney disease, stage 3a (HCC) ?  Acute on chronic respiratory failure with hypoxia and hypercapnia (HCC) ?  Respiratory acidosis ? ?Hospital Course: ?William Kirk is a 81 y.o. male with PMH significant for HTN, PAD, CKD 3, COPD, dysphagia, bronchiectasis, right lower lobe lobectomy in 2020  due to nonfunctional RLL from multiple aspiration pneumonia. ?Patient presented to the ED on 4/12 after he tripped while going up the steps at home. ?He was found to have right periprosthetic hip fracture. ?Admitted to hospitalist service.  Orthopedics was consulted.  S/p ORIF on 4/14. ?  ?Preoperatively he had desaturations into the 80s on the floor, and was found to be in the 60s on preop evaluation.  He was intubated preoperatively due to hypoxia.  He had a prolonged surgery about 4hrs but was able to be extubated in the PACU.  He had prolonged time to wake up.  ABG with mild acute hypercapnia.  He was transferred to icu post op due to  altered mental status, hypoxia, hypercapnia. He required vasopressors briefly , weaned off levophed on 4/15 am. He also required NRB while in the ICU, subsequently weaned down to low-flow by nasal  cannula.Marland Kitchen  He was transferred to [facility] service on 4/16. ?  ?His hospitalization was prolonged due to persistent altered mental status requiring restraints. ?Off restraints since 4/23. ? ?Please see below for problem-based details.  ? ?Assessment and Plan: ?Right Periprosthetic Hip Fracture: s/p revision THA 4/14 ?- Per orthopedics, 20% partial weightbearing on the right lower extremity, posterior hip precautions for 6 weeks.  DVT prophylaxis with Lovenox 40 mg daily for total of 4 weeks.  ?- Continue tylenol prn mild-mod pain and percocet prn severe pain.  ?  ?Post op foot drop ?-PRAFO boot in place. Per orthopedics: likely intraop stretch injury, no significant swelling in the buttock area and not having much pain so low concern for nerve laceration. ?  ?Acute Metabolic Encephalopathy: During this hospitalization, patient had episodes of disorientation, restlessness and required restraints.  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 Since he has PEG tube in place, he has been able to get Zoloft, Seroquel and hydroxyzine consistently.  Mental status gradually improving.  Off restraints since 4/23. ?- Continue Zoloft 200 mg daily, Seroquel 25 mg twice daily and hydroxyzine 10 mg 3 times daily as needed through PEG tube.   ?  ?Septic shock secondary to aspiration pneumonia - not POA ?Acute hypoxic hypercapnic respiratory failure ?- Was on pressors briefly in the ICU, subsequently hemodynamically improved and taken off of pressors. Intubated preoperatively, was able to extubate postop, briefly required NRB, subsequently weaned down.  Currently not on supplemental oxygen. Completed  a course of antibiotics. ?  ?Chronic dysphagia ?Chronic aspiration ?h/o bronchiectasis and RLL lobectomy ?- Aspiration precautions ?- Continue to work with speech therapy. Noted to have significant gross aspiration with any kind of food. Underwent PEG tube inserted by IR on 4/21  (had PEG back in 2022 as well). ?- Continue PEG tube feeding. ?  ?Severe protein calorie malnutrition ?- Dietitian consult appreciated.  Currently on tube feeding with free water and supplements as ordered. ?  ?COPD w/o Exacerbation,  ?- Continue home meds ?    ?Acute blood loss anemia on anemia of chronic disease: ?- Baseline hemoglobin more than 11. This hospitalization, hemoglobin is running close to 7. Lowest was 6.5 for which 1 unit of PRBC was given 4/24.  Hemoglobin 8.6 and stable on last check. Suggest recheck CBC in next week.  ?- Continue Plavix.  Resume Lovenox subcu for DVT prophylaxis for (2 more weeks left). ? ?AKI: Based on improved creatinine (0.87 at last check) the patient does not have CKD. Cr peak of 1.95.  Subsequently improved to normal. ?  ?PVD/HLD ?PAD with intermittent claudication on the left LE and asymptomatic carotid stenosis  ?S/p RIGHT TRANSCAROTID ARTERY REVASCULARIZATION 02/2021 ?-Because of low hemoglobin, Plavix was held on 4/14.  Subsequently resumed. ?-Continue aspirin, Plavix and statin ? ?Pressure injuries:  ?Pressure Injury Heel Posterior;Right Stage 1 -  Intact skin with non-blanchable redness of a localized area usually over a bony prominence. (Active)  ?   ?Location: Heel  ?Location Orientation: Posterior;Right  ?Staging: Stage 1 -  Intact skin with non-blanchable redness of a localized area usually over a bony prominence.  ?Wound Description (Comments):   ?Present on Admission: Yes  ?   ?Pressure Injury 05/22/21 Toe (Comment  which one) Anterior;Left scabed over (Active)  ?05/22/21 0700  ?Location: Toe (Comment  which one) (5th toe)  ?Location Orientation: Anterior;Left  ?Staging:   ?Wound Description (Comments): scabed over  ?Present on Admission:   ? ? ?Consultants: Orthopedics, PCCM, IR ?Procedures performed:  ? 05/21/21 REVISION HIP REPLACEMENT William Laura, MD  ? ?Disposition: Skilled nursing facility ?Diet recommendation:  ?NPO continue tube feeds ?DISCHARGE  MEDICATION: ?Allergies as of 06/02/2021   ? ?   Reactions  ? Testosterone Rash  ? ?  ? ?  ?Medication List  ?  ? ?STOP taking these medications   ? ?gabapentin 800 MG tablet ?Commonly known as: Neurontin ?  ?traZODone 50 MG tablet ?Commonly known as: DESYREL ?  ? ?  ? ?TAKE these medications   ? ?acetaminophen 500 MG tablet ?Commonly known as: TYLENOL ?Take 500 mg by mouth every 8 (eight) hours as needed for moderate pain. ?  ?albuterol 108 (90 Base) MCG/ACT inhaler ?Commonly known as: VENTOLIN HFA ?Inhale 2 puffs into the lungs every 4 (four) hours as needed for wheezing or shortness of breath. ?  ?albuterol (2.5 MG/3ML) 0.083% nebulizer solution ?Commonly known as: PROVENTIL ?Take 3 mLs (2.5 mg total) by nebulization every 6 (six) hours as needed for wheezing or shortness of breath. ?  ?aspirin EC 81 MG tablet ?Take 81 mg by mouth daily. Swallow whole. ?  ?atorvastatin 80 MG tablet ?Commonly known as: LIPITOR ?Take 1 tablet (80 mg total) by mouth at bedtime. ?  ?benzonatate 200 MG capsule ?Commonly known as: TESSALON ?Take 1 capsule (200 mg total) by mouth 3 (three) times daily as needed for cough. ?  ?clopidogrel 75 MG tablet ?Commonly known as: PLAVIX ?Take 1 tablet (75 mg total) by mouth daily. ?  ?  diclofenac sodium 1 % Gel ?Commonly known as: VOLTAREN ?Apply 2 g topically 4 (four) times daily as needed (pain). ?  ?enoxaparin 40 MG/0.4ML injection ?Commonly known as: LOVENOX ?Inject 0.4 mLs (40 mg total) into the skin daily for 15 days. ?Start taking on: June 03, 2021 ?  ?feeding supplement (OSMOLITE 1.5 CAL) Liqd ?Place 1,000 mLs into feeding tube continuous. ?  ?feeding supplement (PROSource TF) liquid ?Place 45 mLs into feeding tube 2 (two) times daily. ?  ?nutrition supplement (JUVEN) Pack ?Place 1 packet into feeding tube 2 (two) times daily between meals. ?  ?fluticasone-salmeterol 250-50 MCG/ACT Aepb ?Commonly known as: Wixela Inhub ?Inhale 1 puff into the lungs in the morning and at bedtime. ?  ?free  water Soln ?Place 160 mLs into feeding tube every 4 (four) hours. ?  ?guaiFENesin 600 MG 12 hr tablet ?Commonly known as: MUCINEX ?Take 600 mg by mouth at bedtime as needed for cough. ?  ?lidocaine 5 % ?Comm

## 2021-06-02 NOTE — Progress Notes (Signed)
0145- Pt having difficulty urinating, bladder scan shows 412, NP Olena Heckle made aware, I&O cath ordered.  ? ?0200- I&O attempted. Catheter met resistance and would not advance into bladder. ? ?0330- pt has voided 125 mL. Bladder scan shows 325. Second I&O catheter attempted (with 2nd RN to assist). Catheter again met resistance. NP Olena Heckle made aware. Will continue to monitor. ?

## 2021-06-02 NOTE — Plan of Care (Signed)
Adequate for discharge.

## 2021-06-02 NOTE — TOC Transition Note (Signed)
Transition of Care (TOC) - CM/SW Discharge Note ? ? ?Patient Details  ?Name: William Kirk ?MRN: [id number] ?Date of Birth: [birth year] ? ?Transition of Care [facility] CM/SW Contact:  ?Joanne Chars, LCSW ?Phone Number: ?06/02/2021, 12:08 PM ? ? ?Clinical Narrative:   Pt discharging to [facility]. RN call [phone] for report.  ? ? ? ?Final next level of care: [place] ?Barriers to Discharge: Barriers Resolved ? ? ?Patient Goals and CMS Choice ?  ?CMS Medicare.gov Compare Post Acute Care list provided to:: Patient Represenative (must comment) ?Choice offered to / list presented to : Spouse ? ?Discharge Placement ?  ?           ?Patient chooses bed at: [facility] ?Patient to be transferred to facility by: [facility] ?Name of family member notified: wife [name] in room ?Patient and family notified of of transfer: 06/02/21 ? ?Discharge Plan and Services ?In-house Referral: Clinical Social Work ?  ?Post Acute Care Choice: [place]          ?  ?  ?  ?  ?  ?  ?  ?  ?  ?  ? ?Social Determinants of Health (SDOH) Interventions ?  ? ? ?Readmission Risk Interventions ? ?  02/25/2021  ?  2:25 PM 11/08/2018  ? 11:19 AM 10/23/2018  ?  8:44 AM  ?Readmission Risk Prevention Plan  ?Post Dischage Appt Complete    ?Medication Screening Complete    ?Transportation Screening Complete Complete Complete  ?PCP or Specialist Appt within 3-5 Days  Complete Complete  ?[place] or Home Care Consult  Complete Complete  ?Social Work Consult for [place] Planning/Counseling  Complete Complete  ?Palliative Care Screening  Not Applicable Not Applicable  ?Medication Review Press photographer)  Complete Complete  ? ? ? ? ? ?

## 2021-06-02 NOTE — Progress Notes (Signed)
Physical Therapy Treatment ?Patient Details ?Name: William Kirk ?MRN: [id number] ?DOB: [birth year] ?Today's Date: 06/02/2021 ? ? ?History of Present Illness 81 y.o. male who presents to the emergency department via EMS from home after sustaining a mechanical fall PTA. periprosthetic right femoral fracture Patient tripped while going up the steps. 4/14 ORIF with replacing the femoral component of previous THA; post-op confusion with hypoxia; required vasopressors;   PMH significant of PVD, COPD, hyperlipidemia, CKD 3A ? ?  ?PT Comments  ? ? Pt received supine and agreeable to session with continued focus on bed mobility and transfers. Pt unable to recall WB or hip precautions and able to intermittently comply with heavy cueing. Pt able to come to EOB with mod assist this session with increased difficulty coordinating movement but with multimodal cueing able to accomplish with increased time. Pt able to come to standing x2 with min assist with ability to maintain WB with cueing. Further gait deferred d/t increased pain. Current plan remains appropriate to address deficits and maximize functional independence and decrease caregiver burden. Pt continues to benefit from skilled PT services to progress toward functional mobility goals.  ?  ?Recommendations for follow up therapy are one component of a multi-disciplinary discharge planning process, led by the attending physician.  Recommendations may be updated based on patient status, additional functional criteria and insurance authorization. ? ?Follow Up Recommendations ? Skilled nursing-short term rehab (<3 hours/day) ?  ?  ?Assistance Recommended at Discharge Frequent or constant Supervision/Assistance  ?Patient can return home with the following Two people to help with walking and/or transfers;Assistance with cooking/housework;Two people to help with bathing/dressing/bathroom;Direct supervision/assist for medications management;Direct supervision/assist for financial  management;Assist for transportation;Help with stairs or ramp for entrance ?  ?Equipment Recommendations ? Rolling walker (2 wheels);Wheelchair (measurements PT);Wheelchair cushion (measurements PT);BSC/3in1  ?  ?Recommendations for Other Services OT consult ? ? ?  ?Precautions / Restrictions Precautions ?Precautions: Posterior Hip;Fall ?Precaution Booklet Issued: No ?Precaution Comments: pt cannot verbalize precautions ?Restrictions ?Weight Bearing Restrictions: Yes ?RLE Weight Bearing: Partial weight bearing ?RLE Partial Weight Bearing Percentage or Pounds: 20  ?  ? ?Mobility ? Bed Mobility ?Overal bed mobility: Needs Assistance ?Bed Mobility: Supine to Sit, Sit to Supine ?  ?  ?Supine to sit: Mod assist ?Sit to supine: Mod assist ?  ?General bed mobility comments: mod a to come to EOB, heavy step by step cueing, pt able to mobilize LEs to EOB assist to elevate trunk, assist with bedpad to come all the way to endge, more d/t cognition than ability  mod a to return BLE into bed and reposition ?  ? ?Transfers ?Overall transfer level: Needs assistance ?Equipment used: Rolling walker (2 wheels) ?Transfers: Sit to/from Stand ?Sit to Stand: Min assist ?  ?  ?  ?  ?  ?General transfer comment: min a to power up to stand, pt pushing from bed this session, cues to elevate trunk, pt able to maintain standing ~3 mins x2, heavy cueing to comply with WB status ?  ? ?Ambulation/Gait ?  ?  ?  ?  ?  ?  ?  ?General Gait Details: defered this session as pt unable to maintain WB precautions ? ? ?Stairs ?  ?  ?  ?  ?  ? ? ?Wheelchair Mobility ?  ? ?Modified Rankin (Stroke Patients Only) ?  ? ? ?  ?Balance Overall balance assessment: History of Falls, Needs assistance ?Sitting-balance support: No upper extremity supported, Feet supported ?Sitting balance-Leahy Scale: Fair ?  ?  ?  Standing balance support: Bilateral upper extremity supported, Reliant on assistive device for balance ?Standing balance-Leahy Scale: Poor ?  ?  ?  ?  ?  ?  ?   ?  ?  ?  ?  ?  ?  ? ?  ?Cognition Arousal/Alertness: Awake/alert ?Behavior During Therapy: Impulsive, Restless ?Overall Cognitive Status: No family/caregiver present to determine baseline cognitive functioning ?  ?  ?  ?  ?  ?  ?  ?  ?  ?  ?  ?  ?  ?  ?  ?  ?General Comments: pt unable to recall precautions or WB status, pleasant affect ?  ?  ? ?  ?Exercises General Exercises - Lower Extremity ?Long Arc Quad: Right, 10 reps, Seated, AAROM, AROM ?Heel Slides: PROM, 5 reps, Supine ?Hip Flexion/Marching: Right, 10 reps, Standing, Seated, AROM, AAROM ? ?  ?General Comments   ?  ?  ? ?Pertinent Vitals/Pain Pain Assessment ?Pain Assessment: Faces ?Faces Pain Scale: Hurts even more ?Pain Location: RLE in weightbearing ?Pain Descriptors / Indicators: Moaning, Operative site guarding ?Pain Intervention(s): Limited activity within patient's tolerance, Monitored during session, Repositioned  ? ? ?Home Living   ?  ?  ?  ?  ?  ?  ?  ?  ?  ?   ?  ?Prior Function    ?  ?  ?   ? ?PT Goals (current goals can now be found in the care plan section) Acute Rehab PT Goals ?PT Goal Formulation: With patient ?Time For Goal Achievement: 06/05/21 ? ?  ?Frequency ? ? ? Min 3X/week ? ? ? ?  ?PT Plan Current plan remains appropriate  ? ? ?Co-evaluation   ?  ?  ?  ?  ? ?  ?AM-PAC PT "6 Clicks" Mobility   ?Outcome Measure ? Help needed turning from your back to your side while in a flat bed without using bedrails?: A Lot ?Help needed moving from lying on your back to sitting on the side of a flat bed without using bedrails?: A Lot ?Help needed moving to and from a bed to a chair (including a wheelchair)?: Total ?Help needed standing up from a chair using your arms (e.g., wheelchair or bedside chair)?: A Lot ?Help needed to walk in hospital room?: A Lot ?Help needed climbing 3-5 steps with a railing? : Total ?6 Click Score: 10 ? ?  ?End of Session Equipment Utilized During Treatment: Gait belt ?Activity Tolerance: Patient tolerated treatment well  (pt limited by confusion) ?Patient left: in bed;with call bell/phone within reach;with bed alarm set ?Nurse Communication: Mobility status ?PT Visit Diagnosis: Other abnormalities of gait and mobility (R26.89);History of falling (Z91.81) ?  ? ? ?Time: 1610-9604 ?PT Time Calculation (min) (ACUTE ONLY): 25 min ? ?Charges:  $Gait Training: 8-22 mins ?$Therapeutic Activity: 8-22 mins          ?          ? ?Lenora Boys. PTA ?Acute Rehabilitation Services ?Office: [phone] ? ? ? ?Marlana Salvage Polk Minor ?06/02/2021, 12:59 PM ? ?

## 2021-06-03 DIAGNOSIS — J449 Chronic obstructive pulmonary disease, unspecified: Secondary | ICD-10-CM | POA: Diagnosis not present

## 2021-06-03 DIAGNOSIS — R1310 Dysphagia, unspecified: Secondary | ICD-10-CM | POA: Diagnosis not present

## 2021-06-03 DIAGNOSIS — M21371 Foot drop, right foot: Secondary | ICD-10-CM | POA: Diagnosis not present

## 2021-06-03 DIAGNOSIS — M9701XA Periprosthetic fracture around internal prosthetic right hip joint, initial encounter: Secondary | ICD-10-CM | POA: Diagnosis not present

## 2021-06-03 DIAGNOSIS — Z8709 Personal history of other diseases of the respiratory system: Secondary | ICD-10-CM | POA: Diagnosis not present

## 2021-06-03 DIAGNOSIS — Z931 Gastrostomy status: Secondary | ICD-10-CM | POA: Diagnosis not present

## 2021-06-03 DIAGNOSIS — Z8669 Personal history of other diseases of the nervous system and sense organs: Secondary | ICD-10-CM | POA: Diagnosis not present

## 2021-06-03 DIAGNOSIS — Z87898 Personal history of other specified conditions: Secondary | ICD-10-CM | POA: Diagnosis not present

## 2021-06-03 DIAGNOSIS — E43 Unspecified severe protein-calorie malnutrition: Secondary | ICD-10-CM | POA: Diagnosis not present

## 2021-06-07 DIAGNOSIS — G3184 Mild cognitive impairment, so stated: Secondary | ICD-10-CM | POA: Diagnosis not present

## 2021-06-07 DIAGNOSIS — F330 Major depressive disorder, recurrent, mild: Secondary | ICD-10-CM | POA: Diagnosis not present

## 2021-06-07 DIAGNOSIS — F4310 Post-traumatic stress disorder, unspecified: Secondary | ICD-10-CM | POA: Diagnosis not present

## 2021-06-07 DIAGNOSIS — F5101 Primary insomnia: Secondary | ICD-10-CM | POA: Diagnosis not present

## 2021-06-08 DIAGNOSIS — J9621 Acute and chronic respiratory failure with hypoxia: Secondary | ICD-10-CM | POA: Diagnosis not present

## 2021-06-08 DIAGNOSIS — R2681 Unsteadiness on feet: Secondary | ICD-10-CM | POA: Diagnosis not present

## 2021-06-08 DIAGNOSIS — I739 Peripheral vascular disease, unspecified: Secondary | ICD-10-CM | POA: Diagnosis not present

## 2021-06-08 DIAGNOSIS — M93971 Osteochondropathy, unspecified, right ankle and foot: Secondary | ICD-10-CM | POA: Diagnosis not present

## 2021-06-08 DIAGNOSIS — S7291XD Unspecified fracture of right femur, subsequent encounter for closed fracture with routine healing: Secondary | ICD-10-CM | POA: Diagnosis not present

## 2021-06-08 DIAGNOSIS — E46 Unspecified protein-calorie malnutrition: Secondary | ICD-10-CM | POA: Diagnosis not present

## 2021-06-08 DIAGNOSIS — R41841 Cognitive communication deficit: Secondary | ICD-10-CM | POA: Diagnosis not present

## 2021-06-08 DIAGNOSIS — Z9181 History of falling: Secondary | ICD-10-CM | POA: Diagnosis not present

## 2021-06-10 ENCOUNTER — Emergency Department (HOSPITAL_COMMUNITY): Payer: No Typology Code available for payment source

## 2021-06-10 ENCOUNTER — Encounter (HOSPITAL_COMMUNITY): Payer: Self-pay | Admitting: Emergency Medicine

## 2021-06-10 ENCOUNTER — Other Ambulatory Visit: Payer: Self-pay

## 2021-06-10 ENCOUNTER — Observation Stay (HOSPITAL_COMMUNITY)
Admission: EM | Admit: 2021-06-10 | Discharge: 2021-06-12 | Disposition: A | Discharge status: Skilled Nursing Facility | Payer: No Typology Code available for payment source | Attending: Internal Medicine | Admitting: Internal Medicine

## 2021-06-10 DIAGNOSIS — I739 Peripheral vascular disease, unspecified: Secondary | ICD-10-CM | POA: Diagnosis not present

## 2021-06-10 DIAGNOSIS — Z7902 Long term (current) use of antithrombotics/antiplatelets: Secondary | ICD-10-CM | POA: Diagnosis not present

## 2021-06-10 DIAGNOSIS — Z96641 Presence of right artificial hip joint: Secondary | ICD-10-CM | POA: Diagnosis not present

## 2021-06-10 DIAGNOSIS — F0390 Unspecified dementia without behavioral disturbance: Secondary | ICD-10-CM | POA: Insufficient documentation

## 2021-06-10 DIAGNOSIS — I129 Hypertensive chronic kidney disease with stage 1 through stage 4 chronic kidney disease, or unspecified chronic kidney disease: Secondary | ICD-10-CM | POA: Diagnosis not present

## 2021-06-10 DIAGNOSIS — Z7982 Long term (current) use of aspirin: Secondary | ICD-10-CM | POA: Diagnosis not present

## 2021-06-10 DIAGNOSIS — E782 Mixed hyperlipidemia: Secondary | ICD-10-CM | POA: Diagnosis not present

## 2021-06-10 DIAGNOSIS — N1831 Chronic kidney disease, stage 3a: Secondary | ICD-10-CM | POA: Insufficient documentation

## 2021-06-10 DIAGNOSIS — D75839 Thrombocytosis, unspecified: Secondary | ICD-10-CM | POA: Diagnosis present

## 2021-06-10 DIAGNOSIS — Z79899 Other long term (current) drug therapy: Secondary | ICD-10-CM | POA: Insufficient documentation

## 2021-06-10 DIAGNOSIS — N179 Acute kidney failure, unspecified: Secondary | ICD-10-CM | POA: Diagnosis not present

## 2021-06-10 DIAGNOSIS — D509 Iron deficiency anemia, unspecified: Secondary | ICD-10-CM | POA: Diagnosis present

## 2021-06-10 DIAGNOSIS — Z87891 Personal history of nicotine dependence: Secondary | ICD-10-CM | POA: Insufficient documentation

## 2021-06-10 DIAGNOSIS — F419 Anxiety disorder, unspecified: Secondary | ICD-10-CM | POA: Diagnosis present

## 2021-06-10 DIAGNOSIS — D72829 Elevated white blood cell count, unspecified: Secondary | ICD-10-CM | POA: Diagnosis not present

## 2021-06-10 DIAGNOSIS — Z96651 Presence of right artificial knee joint: Secondary | ICD-10-CM | POA: Insufficient documentation

## 2021-06-10 DIAGNOSIS — I10 Essential (primary) hypertension: Secondary | ICD-10-CM | POA: Diagnosis present

## 2021-06-10 DIAGNOSIS — D649 Anemia, unspecified: Secondary | ICD-10-CM

## 2021-06-10 DIAGNOSIS — R0602 Shortness of breath: Secondary | ICD-10-CM | POA: Diagnosis present

## 2021-06-10 DIAGNOSIS — Z7901 Long term (current) use of anticoagulants: Secondary | ICD-10-CM | POA: Insufficient documentation

## 2021-06-10 DIAGNOSIS — F32A Depression, unspecified: Secondary | ICD-10-CM | POA: Diagnosis present

## 2021-06-10 DIAGNOSIS — I5189 Other ill-defined heart diseases: Secondary | ICD-10-CM

## 2021-06-10 DIAGNOSIS — D631 Anemia in chronic kidney disease: Secondary | ICD-10-CM | POA: Diagnosis not present

## 2021-06-10 DIAGNOSIS — J449 Chronic obstructive pulmonary disease, unspecified: Secondary | ICD-10-CM | POA: Diagnosis not present

## 2021-06-10 DIAGNOSIS — J9611 Chronic respiratory failure with hypoxia: Secondary | ICD-10-CM | POA: Diagnosis not present

## 2021-06-10 DIAGNOSIS — Z96649 Presence of unspecified artificial hip joint: Secondary | ICD-10-CM

## 2021-06-10 DIAGNOSIS — J479 Bronchiectasis, uncomplicated: Secondary | ICD-10-CM

## 2021-06-10 DIAGNOSIS — N182 Chronic kidney disease, stage 2 (mild): Secondary | ICD-10-CM | POA: Diagnosis present

## 2021-06-10 HISTORY — DX: Other ill-defined heart diseases: I51.89

## 2021-06-10 LAB — URINALYSIS, ROUTINE W REFLEX MICROSCOPIC
Bilirubin Urine: NEGATIVE
Glucose, UA: NEGATIVE mg/dL
Hgb urine dipstick: NEGATIVE
Ketones, ur: NEGATIVE mg/dL
Leukocytes,Ua: NEGATIVE
Nitrite: NEGATIVE
Protein, ur: NEGATIVE mg/dL
Specific Gravity, Urine: 1.021 (ref 1.005–1.030)
pH: 7 (ref 5.0–8.0)

## 2021-06-10 LAB — CBC WITH DIFFERENTIAL/PLATELET
Abs Immature Granulocytes: 0.1 10*3/uL — ABNORMAL HIGH (ref 0.00–0.07)
Basophils Absolute: 0 10*3/uL (ref 0.0–0.1)
Basophils Relative: 0 %
Eosinophils Absolute: 0 10*3/uL (ref 0.0–0.5)
Eosinophils Relative: 0 %
HCT: 28.3 % — ABNORMAL LOW (ref 39.0–52.0)
Hemoglobin: 8.9 g/dL — ABNORMAL LOW (ref 13.0–17.0)
Immature Granulocytes: 1 %
Lymphocytes Relative: 5 %
Lymphs Abs: 0.9 10*3/uL (ref 0.7–4.0)
MCH: 27.8 pg (ref 26.0–34.0)
MCHC: 31.4 g/dL (ref 30.0–36.0)
MCV: 88.4 fL (ref 80.0–100.0)
Monocytes Absolute: 1 10*3/uL (ref 0.1–1.0)
Monocytes Relative: 6 %
Neutro Abs: 13.8 10*3/uL — ABNORMAL HIGH (ref 1.7–7.7)
Neutrophils Relative %: 88 %
Platelets: 499 10*3/uL — ABNORMAL HIGH (ref 150–400)
RBC: 3.2 MIL/uL — ABNORMAL LOW (ref 4.22–5.81)
RDW: 16.3 % — ABNORMAL HIGH (ref 11.5–15.5)
WBC: 15.8 10*3/uL — ABNORMAL HIGH (ref 4.0–10.5)
nRBC: 0 % (ref 0.0–0.2)

## 2021-06-10 LAB — COMPREHENSIVE METABOLIC PANEL
ALT: 31 U/L (ref 0–44)
AST: 30 U/L (ref 15–41)
Albumin: 2.9 g/dL — ABNORMAL LOW (ref 3.5–5.0)
Alkaline Phosphatase: 118 U/L (ref 38–126)
Anion gap: 9 (ref 5–15)
BUN: 63 mg/dL — ABNORMAL HIGH (ref 8–23)
CO2: 26 mmol/L (ref 22–32)
Calcium: 9 mg/dL (ref 8.9–10.3)
Chloride: 109 mmol/L (ref 98–111)
Creatinine, Ser: 1.48 mg/dL — ABNORMAL HIGH (ref 0.61–1.24)
GFR, Estimated: 48 mL/min — ABNORMAL LOW (ref >60)
Glucose, Bld: 181 mg/dL — ABNORMAL HIGH (ref 70–99)
Potassium: 4.6 mmol/L (ref 3.5–5.1)
Sodium: 144 mmol/L (ref 135–145)
Total Bilirubin: 0.7 mg/dL (ref 0.3–1.2)
Total Protein: 7.1 g/dL (ref 6.5–8.1)

## 2021-06-10 LAB — LACTIC ACID, PLASMA
Lactic Acid, Venous: 1 mmol/L (ref 0.5–1.9)
Lactic Acid, Venous: 0.9 mmol/L (ref 0.5–1.9)

## 2021-06-10 LAB — GLUCOSE, CAPILLARY
Glucose-Capillary: 95 mg/dL (ref 70–99)
Glucose-Capillary: 91 mg/dL (ref 70–99)
Glucose-Capillary: 102 mg/dL — ABNORMAL HIGH (ref 70–99)

## 2021-06-10 LAB — APTT: aPTT: 24 seconds (ref 24–36)

## 2021-06-10 LAB — PROTIME-INR
INR: 1.2 (ref 0.8–1.2)
Prothrombin Time: 15.1 seconds (ref 11.4–15.2)

## 2021-06-10 MED ORDER — ACETAMINOPHEN 325 MG PO TABS
650.0000 mg | ORAL_TABLET | Freq: Four times a day (QID) | ORAL | Status: DC | PRN
  Administered 2021-06-11 (×2): 650 mg via ORAL
  Filled 2021-06-10 (×2): qty 2

## 2021-06-10 MED ORDER — ACETAMINOPHEN 650 MG RE SUPP: 650.0000 mg | Freq: Four times a day (QID) | RECTAL | Status: DC | PRN

## 2021-06-10 MED ORDER — IPRATROPIUM-ALBUTEROL 0.5-2.5 (3) MG/3ML IN SOLN
3.0000 mL | Freq: Four times a day (QID) | RESPIRATORY_TRACT | Status: DC
  Administered 2021-06-10: 3 mL via RESPIRATORY_TRACT
  Filled 2021-06-10: qty 3

## 2021-06-10 MED ORDER — FREE WATER: 160.0000 mL | Status: DC

## 2021-06-10 MED ORDER — OSMOLITE 1.5 CAL PO LIQD
237.0000 mL | Freq: Every day | ORAL | Status: DC
  Administered 2021-06-10 – 2021-06-12 (×9): 237 mL
  Filled 2021-06-10 (×10): qty 237

## 2021-06-10 MED ORDER — ONDANSETRON HCL 4 MG/2ML IJ SOLN: 4.0000 mg | Freq: Four times a day (QID) | INTRAMUSCULAR | Status: DC | PRN

## 2021-06-10 MED ORDER — MELATONIN 3 MG PO TABS
3.0000 mg | ORAL_TABLET | Freq: Every day | ORAL | Status: DC
  Administered 2021-06-10 – 2021-06-11 (×2): 3 mg
  Filled 2021-06-10 (×2): qty 1

## 2021-06-10 MED ORDER — ASPIRIN 81 MG PO CHEW
81.0000 mg | CHEWABLE_TABLET | Freq: Every day | ORAL | Status: DC
  Administered 2021-06-10 – 2021-06-12 (×3): 81 mg
  Filled 2021-06-10 (×3): qty 1

## 2021-06-10 MED ORDER — SODIUM CHLORIDE 0.45 % IV SOLN
INTRAVENOUS | Status: AC
Start: 2021-06-10 — End: 2021-06-11

## 2021-06-10 MED ORDER — JUVEN PO PACK
1.0000 | PACK | Freq: Two times a day (BID) | ORAL | Status: DC
  Administered 2021-06-11 – 2021-06-12 (×3): 1
  Filled 2021-06-10 (×4): qty 1

## 2021-06-10 MED ORDER — PANTOPRAZOLE 2 MG/ML SUSPENSION
40.0000 mg | Freq: Every day | ORAL | Status: DC
  Administered 2021-06-10 – 2021-06-12 (×3): 40 mg
  Filled 2021-06-10 (×3): qty 20

## 2021-06-10 MED ORDER — OSMOLITE 1.2 CAL PO LIQD: 1000.0000 mL | ORAL | Status: DC

## 2021-06-10 MED ORDER — METHOCARBAMOL 500 MG PO TABS
500.0000 mg | ORAL_TABLET | Freq: Two times a day (BID) | ORAL | Status: DC | PRN
  Administered 2021-06-11: 500 mg
  Filled 2021-06-10: qty 1

## 2021-06-10 MED ORDER — ORAL CARE MOUTH RINSE
15.0000 mL | Freq: Two times a day (BID) | OROMUCOSAL | Status: DC
  Administered 2021-06-10 – 2021-06-12 (×4): 15 mL via OROMUCOSAL

## 2021-06-10 MED ORDER — PROSOURCE TF PO LIQD
45.0000 mL | Freq: Two times a day (BID) | ORAL | Status: DC
  Administered 2021-06-10: 45 mL
  Filled 2021-06-10: qty 45

## 2021-06-10 MED ORDER — PROSOURCE TF PO LIQD
45.0000 mL | Freq: Every day | ORAL | Status: DC
  Administered 2021-06-10 – 2021-06-12 (×9): 45 mL
  Filled 2021-06-10 (×10): qty 45

## 2021-06-10 MED ORDER — ATORVASTATIN CALCIUM 40 MG PO TABS
80.0000 mg | ORAL_TABLET | Freq: Every day | ORAL | Status: DC
  Administered 2021-06-10 – 2021-06-11 (×2): 80 mg
  Filled 2021-06-10 (×2): qty 2

## 2021-06-10 MED ORDER — MOMETASONE FURO-FORMOTEROL FUM 200-5 MCG/ACT IN AERO
2.0000 | INHALATION_SPRAY | Freq: Two times a day (BID) | RESPIRATORY_TRACT | Status: DC
  Administered 2021-06-10 – 2021-06-12 (×4): 2 via RESPIRATORY_TRACT
  Filled 2021-06-10: qty 8.8

## 2021-06-10 MED ORDER — LACTATED RINGERS IV BOLUS
1000.0000 mL | Freq: Once | INTRAVENOUS | Status: DC
Start: 2021-06-10 — End: 2021-06-10

## 2021-06-10 MED ORDER — QUETIAPINE FUMARATE 25 MG PO TABS
50.0000 mg | ORAL_TABLET | Freq: Every day | ORAL | Status: DC
  Administered 2021-06-10 – 2021-06-11 (×2): 50 mg
  Filled 2021-06-10 (×2): qty 2

## 2021-06-10 MED ORDER — GUAIFENESIN-DM 100-10 MG/5ML PO SYRP: 5.0000 mL | ORAL_SOLUTION | Freq: Two times a day (BID) | ORAL | Status: DC | PRN

## 2021-06-10 MED ORDER — ENOXAPARIN SODIUM 40 MG/0.4ML IJ SOSY
40.0000 mg | PREFILLED_SYRINGE | INTRAMUSCULAR | Status: DC
  Administered 2021-06-10 – 2021-06-12 (×3): 40 mg via SUBCUTANEOUS
  Filled 2021-06-10 (×3): qty 0.4

## 2021-06-10 MED ORDER — IPRATROPIUM-ALBUTEROL 0.5-2.5 (3) MG/3ML IN SOLN
3.0000 mL | Freq: Three times a day (TID) | RESPIRATORY_TRACT | Status: DC
  Administered 2021-06-10 – 2021-06-11 (×3): 3 mL via RESPIRATORY_TRACT
  Filled 2021-06-10 (×3): qty 3

## 2021-06-10 MED ORDER — METHOCARBAMOL 500 MG PO TABS
500.0000 mg | ORAL_TABLET | Freq: Every day | ORAL | Status: DC
  Administered 2021-06-10 – 2021-06-11 (×2): 500 mg
  Filled 2021-06-10 (×2): qty 1

## 2021-06-10 MED ORDER — ADULT MULTIVITAMIN LIQUID CH
15.0000 mL | Freq: Every day | ORAL | Status: DC
  Administered 2021-06-10 – 2021-06-12 (×3): 15 mL
  Filled 2021-06-10 (×3): qty 15

## 2021-06-10 MED ORDER — LIDOCAINE 5 % EX PTCH
1.0000 | MEDICATED_PATCH | Freq: Every day | CUTANEOUS | Status: DC | PRN
  Administered 2021-06-11: 1 via TRANSDERMAL
  Filled 2021-06-10 (×2): qty 1

## 2021-06-10 MED ORDER — OSMOLITE 1.5 CAL PO LIQD: 1000.0000 mL | ORAL | Status: DC

## 2021-06-10 MED ORDER — DICLOFENAC SODIUM 1 % EX GEL: 2.0000 g | Freq: Four times a day (QID) | CUTANEOUS | Status: DC | PRN

## 2021-06-10 MED ORDER — ONDANSETRON HCL 4 MG PO TABS: 4.0000 mg | ORAL_TABLET | Freq: Four times a day (QID) | ORAL | Status: DC | PRN

## 2021-06-10 MED ORDER — LACTATED RINGERS IV BOLUS
1000.0000 mL | Freq: Once | INTRAVENOUS | Status: AC
Start: 2021-06-10 — End: 2021-06-10

## 2021-06-10 MED ORDER — CHLORHEXIDINE GLUCONATE 0.12 % MT SOLN
15.0000 mL | Freq: Two times a day (BID) | OROMUCOSAL | Status: DC
  Administered 2021-06-10 – 2021-06-12 (×5): 15 mL via OROMUCOSAL
  Filled 2021-06-10 (×5): qty 15

## 2021-06-10 MED ORDER — CLOPIDOGREL BISULFATE 75 MG PO TABS
75.0000 mg | ORAL_TABLET | Freq: Every day | ORAL | Status: DC
  Administered 2021-06-10 – 2021-06-12 (×3): 75 mg
  Filled 2021-06-10 (×3): qty 1

## 2021-06-10 MED ORDER — QUETIAPINE FUMARATE 25 MG PO TABS: 25.0000 mg | ORAL_TABLET | ORAL | Status: DC

## 2021-06-10 MED ORDER — SODIUM CHLORIDE 0.45 % IV SOLN
INTRAVENOUS | Status: DC
Start: 2021-06-10 — End: 2021-06-10

## 2021-06-10 MED ORDER — QUETIAPINE FUMARATE 25 MG PO TABS: 25.0000 mg | ORAL_TABLET | Freq: Every day | ORAL | Status: DC

## 2021-06-10 MED ORDER — SERTRALINE HCL 100 MG PO TABS
200.0000 mg | ORAL_TABLET | Freq: Every day | ORAL | Status: DC
  Administered 2021-06-10 – 2021-06-12 (×3): 200 mg
  Filled 2021-06-10 (×3): qty 2

## 2021-06-10 MED ORDER — FREE WATER
60.0000 mL | Freq: Every day | Status: DC
  Administered 2021-06-10 – 2021-06-12 (×9): 60 mL

## 2021-06-10 MED ORDER — QUETIAPINE FUMARATE 25 MG PO TABS: 50.0000 mg | ORAL_TABLET | Freq: Every day | ORAL | Status: DC

## 2021-06-10 MED ORDER — LACTATED RINGERS IV SOLN: INTRAVENOUS | Status: DC

## 2021-06-10 MED ORDER — FREE WATER: 160.0000 mL | Freq: Three times a day (TID) | Status: DC

## 2021-06-10 MED ORDER — JUVEN PO PACK
1.0000 | PACK | Freq: Two times a day (BID) | ORAL | Status: DC
  Administered 2021-06-10: 1
  Filled 2021-06-10 (×2): qty 1

## 2021-06-10 MED ORDER — OXYCODONE-ACETAMINOPHEN 5-325 MG PO TABS: 1.0000 | ORAL_TABLET | Freq: Four times a day (QID) | ORAL | Status: DC | PRN

## 2021-06-10 MED ORDER — QUETIAPINE FUMARATE 25 MG PO TABS
25.0000 mg | ORAL_TABLET | Freq: Every day | ORAL | Status: DC
  Administered 2021-06-11 – 2021-06-12 (×2): 25 mg
  Filled 2021-06-10 (×3): qty 1

## 2021-06-10 MED ORDER — HALOPERIDOL LACTATE 5 MG/ML IJ SOLN
4.0000 mg | Freq: Once | INTRAMUSCULAR | Status: AC
  Administered 2021-06-10: 4 mg via INTRAVENOUS
  Filled 2021-06-10: qty 1

## 2021-06-10 MED ORDER — MIDODRINE HCL 5 MG PO TABS: 2.5000 mg | ORAL_TABLET | Freq: Two times a day (BID) | ORAL | Status: DC | PRN

## 2021-06-10 MED ORDER — METOPROLOL TARTRATE 25 MG PO TABS
12.5000 mg | ORAL_TABLET | Freq: Every day | ORAL | Status: DC
  Administered 2021-06-10 – 2021-06-12 (×3): 12.5 mg
  Filled 2021-06-10 (×3): qty 1

## 2021-06-10 NOTE — ED Triage Notes (Addendum)
Pt having thick cough. He is normally not on oxygen. Pt lives at [facility] and he is a new admission about a month. He was admitted status post fall/ fx and rehab. He has a G tube. Hx of COPD and chronic aspiration. Tonight he was stating he could not breath so staff put him on 3 L [place]. He is confused at baseline and is hoarse.  ?

## 2021-06-10 NOTE — ED Provider Notes (Signed)
?[facility] DEPT ?Provider Note ? ? ?CSN: [id number] ?Arrival date & time: 06/10/21  8466 ? ?  ? ?History ? ?Chief Complaint  ?Patient presents with  ? Shortness of Breath  ?   ?  ? ? ?William Kirk is a 81 y.o. male. ? ?The history is provided by the nursing home. The history is limited by the condition of the patient (Dementia).  ?Shortness of Breath ?He was transferred from a skilled nursing facility where he was noted to have a new oxygen requirement.  He is reported to have a cough which is productive of some thick sputum.  He was unable to breathe tonight and was placed on oxygen 3 L/min nasal cannula.  He had not been on oxygen previously.  There is no known fever, but he does have a PEG tube for feeding purposes. ?  ?Home Medications ?Prior to Admission medications   ?Medication Sig Start Date End Date Taking? Authorizing Provider  ?acetaminophen (TYLENOL) 500 MG tablet Take 500 mg by mouth every 8 (eight) hours as needed for moderate pain. 10/28/19   [provider]  ?albuterol (PROVENTIL) (2.5 MG/3ML) 0.083% nebulizer solution Take 3 mLs (2.5 mg total) by nebulization every 6 (six) hours as needed for wheezing or shortness of breath. 05/07/21   Parrett, Fonnie Mu, NP  ?albuterol (VENTOLIN HFA) 108 (90 Base) MCG/ACT inhaler Inhale 2 puffs into the lungs every 4 (four) hours as needed for wheezing or shortness of breath. 07/11/18   Hongalgi, Lenis Dickinson, MD  ?aspirin EC 81 MG tablet Take 81 mg by mouth daily. Swallow whole.    [provider]  ?atorvastatin (LIPITOR) 80 MG tablet Take 1 tablet (80 mg total) by mouth at bedtime. 02/25/21   Ulyses Amor, PA-C  ?benzonatate (TESSALON) 200 MG capsule Take 1 capsule (200 mg total) by mouth 3 (three) times daily as needed for cough. 02/17/21   Marshell Garfinkel, MD  ?Cholecalciferol (VITAMIN D) 50 MCG (2000 UT) tablet Take 2,000 Units by mouth daily.    [provider]  ?clopidogrel (PLAVIX) 75 MG tablet Take 1 tablet  (75 mg total) by mouth daily. 01/12/21   Cherre Robins, MD  ?Dextromethorphan-guaiFENesin [facility] DM PO) Take 1 Dose by mouth at bedtime as needed (cough). liquid    [provider]  ?diclofenac sodium (VOLTAREN) 1 % GEL Apply 2 g topically 4 (four) times daily as needed (pain).     [provider]  ?enoxaparin (LOVENOX) 40 MG/0.4ML injection Inject 0.4 mLs (40 mg total) into the skin daily for 15 days. 06/03/21 06/18/21  Patrecia Pour, MD  ?fluticasone-salmeterol [facility] INHUB) 250-50 MCG/ACT AEPB Inhale 1 puff into the lungs in the morning and at bedtime. 05/07/21   Parrett, Fonnie Mu, NP  ?guaiFENesin (MUCINEX) 600 MG 12 hr tablet Take 600 mg by mouth at bedtime as needed for cough.    [provider]  ?lidocaine (LIDODERM) 5 % Place 1 patch onto the skin daily as needed (pain).     [provider]  ?melatonin 3 MG TABS tablet Take 1 tablet (3 mg total) by mouth at bedtime. 06/01/21   Terrilee Croak, MD  ?methocarbamol (ROBAXIN) 500 MG tablet Take 1 tablet (500 mg total) by mouth 2 (two) times daily as needed for muscle spasms. 04/16/20   Jamse Arn, MD  ?metoprolol tartrate (LOPRESSOR) 25 MG tablet Take 12.5 mg by mouth daily.    [provider]  ?Multiple Vitamins-Minerals (MULTIVITAMIN WITH MINERALS)  tablet Take 1 tablet by mouth daily.    [provider]  ?nutrition supplement, JUVEN, (JUVEN) PACK Place 1 packet into feeding tube 2 (two) times daily between meals. 06/01/21   Terrilee Croak, MD  ?Nutritional Supplements (FEEDING SUPPLEMENT, OSMOLITE 1.5 CAL,) LIQD Place 1,000 mLs into feeding tube continuous. 06/01/21   Terrilee Croak, MD  ?Nutritional Supplements (FEEDING SUPPLEMENT, PROSOURCE TF,) liquid Place 45 mLs into feeding tube 2 (two) times daily. 06/01/21   Terrilee Croak, MD  ?pantoprazole (PROTONIX) 40 MG tablet Take 1 tablet (40 mg total) by mouth daily. 06/01/21   Terrilee Croak, MD  ?Propylene Glycol (SYSTANE COMPLETE) 0.6 % SOLN Place 1 drop into  both eyes daily.    [provider]  ?QUEtiapine (SEROQUEL) 50 MG tablet Place 1 tablet (50 mg total) into feeding tube 2 (two) times daily. 06/01/21   Terrilee Croak, MD  ?sertraline (ZOLOFT) 100 MG tablet Place 2 tablets (200 mg total) into feeding tube daily. ?Patient taking differently: Take 200 mg by mouth daily. 11/23/18   [name], PA-C  ?Tiotropium Bromide Monohydrate (SPIRIVA RESPIMAT) 2.5 MCG/ACT AERS Inhale 2 puffs into the lungs in the morning. 05/07/21   Parrett, Fonnie Mu, NP  ?Water For Irrigation, Sterile (FREE WATER) SOLN Place 160 mLs into feeding tube every 4 (four) hours. 06/01/21   Terrilee Croak, MD  ?   ? ?Allergies    ?Testosterone   ? ?Review of Systems   ?Review of Systems  ?Unable to perform ROS: Dementia  ?Respiratory:  Positive for shortness of breath.   ? ?Physical Exam ?Updated Vital Signs ?BP (!) 125/52 (BP Location: Left Arm)   Pulse 97   Temp 98.2 ?F (36.8 ?C) (Oral)   Resp 20   SpO2 94%  ?Physical Exam ?Vitals and nursing note reviewed.  ?81 year old male, resting comfortably and in no acute distress. Vital signs are significant for borderline low blood pressure. Oxygen saturation is 94%, which is normal. ?Head is normocephalic and atraumatic. PERRLA, EOMI. Oropharynx is clear. ?Neck is nontender and supple without adenopathy or JVD. ?Back is nontender and there is no CVA tenderness. ?Lungs have decreased breath sounds at the right base with faint rales present. ?Chest is nontender. ?Heart has regular rate and rhythm without murmur. ?Abdomen is soft, flat, nontender.  PEG tube is in place.Marland Kitchen ?Extremities have no cyanosis or edema, full range of motion is present. ?Skin is warm and dry without rash. ?Neurologic: Awake but slow to answer questions, cranial nerves are intact, moves all extremities equally. ? ?ED Results / Procedures / Treatments   ?Labs ?(all labs ordered are listed, but only abnormal results are displayed) ?Labs Reviewed  ?COMPREHENSIVE METABOLIC PANEL -  Abnormal; Notable for the following components:  ?    Result Value  ? Glucose, Bld 181 (*)   ? BUN 63 (*)   ? Creatinine, Ser 1.48 (*)   ? Albumin 2.9 (*)   ? GFR, Estimated 48 (*)   ? All other components within normal limits  ?CBC WITH DIFFERENTIAL/PLATELET - Abnormal; Notable for the following components:  ? WBC 15.8 (*)   ? RBC 3.20 (*)   ? Hemoglobin 8.9 (*)   ? HCT 28.3 (*)   ? RDW 16.3 (*)   ? Platelets 499 (*)   ? Neutro Abs 13.8 (*)   ? Abs Immature Granulocytes 0.10 (*)   ? All other components within normal limits  ?CULTURE, BLOOD (ROUTINE X 2)  ?CULTURE, BLOOD (ROUTINE X 2)  ?  URINE CULTURE  ?LACTIC ACID, PLASMA  ?PROTIME-INR  ?APTT  ?URINALYSIS, ROUTINE W REFLEX MICROSCOPIC  ?LACTIC ACID, PLASMA  ? ? ?EKG ?EKG Interpretation ? ?Date/Time:  Thursday Jun 10 2021 04:16:46 EDT ?Ventricular Rate:  91 ?PR Interval:  99 ?QRS Duration: 96 ?QT Interval:  372 ?QTC Calculation: 458 ?R Axis:   76 ?Text Interpretation: Sinus rhythm Atrial premature complexes Short PR interval Abnormal R-wave progression, early transition When compared with ECG of 05/19/2021, Premature atrial complexes are now present Confirmed by Delora Fuel ([id number]) on 06/10/2021 4:19:25 AM ? ?Radiology ?DG Chest Portable 1 View ? ?Result Date: 06/10/2021 ?CLINICAL DATA:  Cough EXAM: PORTABLE CHEST 1 VIEW COMPARISON:  05/22/2021 FINDINGS: Volume loss and opacity at the right base where there is been prior surgery. Patchy reticulation at the left lung base which is similar to prior. No edema, effusion, or pneumothorax. Normal heart size and stable mediastinal contours. Artifact from EKG leads. IMPRESSION: Chronic lung disease and postoperative distortion. Right upper lobe aeration is improved since 05/22/2021. Electronically Signed   By: Jorje Guild M.D.   On: 06/10/2021 04:09   ? ?Procedures ?Procedures  ?Cardiac monitor shows normal sinus rhythm, per my interpretation. ? ?Medications Ordered in ED ?Medications  ?lactated ringers infusion (has no  administration in time range)  ? ? ?ED Course/ Medical Decision Making/ A&P ?  ?                        ?Medical Decision Making ?Amount and/or Complexity of Data Reviewed ?Labs: ordered. ?Radiology: ordere

## 2021-06-10 NOTE — ED Notes (Signed)
Pt has removed oxygen and sats holding at 92-95%.  ?

## 2021-06-10 NOTE — H&P (Signed)
?History and Physical  ? ? ?Patient: William Kirk OAC:[id number] DOB: [birth year] ?DOA: 06/10/2021 ?DOS: the patient was seen and examined on 06/10/2021 ?PCP: Floreen Comber, PA-C  ?Patient coming from: SNF ? ?Chief Complaint:  ?Chief Complaint  ?Patient presents with  ? Shortness of Breath  ?   ?  ? ?HPI: William Kirk is a 81 y.o. male with medical history significant of anxiety, depression, dementia, osteoarthritis of the hands, PUD, history of bleeding gastric ulcer, COPD, chronic cough, dyspnea, hypertension, PVD, multiple episodes of pneumonia who is sent from his facility due to dyspnea.  The patient is confused at baseline and unable to provide meaningful history.  He is able to answer simple questions.  He denied headache, sore throat, rhinorrhea, chest, back or abdominal pain, however, stated he felt mildly dyspneic. ? ?ED course: Initial vital signs were temperature 98.2 ?F, pulse 97, respiration 20, BP 125/52 mmHg O2 sat 94% on room air.  The patient was started on LR at 100 mL/h. ? ?Lab work: Urinalysis was normal.  Lactic acid x2 unremarkable.  Normal PT, INR PTT.  CBC is her white count of 15.8, hemoglobin 8.9 g/dL platelets 010.  CMP with normal electrolytes.  Glucose 181, BUN 63 and creatinine 1.48 mg/dL (last week creatinine was 0.87 and 0.82 mg/dL).  LFTs only showed a low albumin at 2.9 g/dL but are otherwise normal. ? ?Imaging: Portable 1 view chest radiograph shows chronic lung disease with RLL lobectomy and improved aggregation on the right upper lobe since previous radiograph on 05/22/2021.  Please see images for further details. ?  ?Review of Systems: As mentioned in the history of present illness. All other systems reviewed and are negative. ?Past Medical History:  ?Diagnosis Date  ? Anxiety   ? Arthritis   ? "hands" (09/21/2016)  ? Bleeding stomach ulcer 2000s  ? COPD (chronic obstructive pulmonary disease) (HCC)   ? Cough   ? Depression   ? Dyspnea   ? Hypertension   ? Pneumonia   ?  "now and several times before this" (09/21/2016)  ? PVD (peripheral vascular disease) (HCC) 2014  ? prev PTCA on RLE  ? ?Past Surgical History:  ?Procedure Laterality Date  ? ABDOMINAL AORTOGRAM W/LOWER EXTREMITY Bilateral 03/05/2021  ? Procedure: ABDOMINAL AORTOGRAM W/LOWER EXTREMITY;  Surgeon: Leonie Douglas, MD;  Location: [facility] INVASIVE CV LAB;  Service: Cardiovascular;  Laterality: Bilateral;  ? HIP ARTHROPLASTY Right 05/21/2021  ? Procedure: REVISION HIP REPLACEMENT;  Surgeon: [name], MD;  Location: [facility] OR;  Service: Orthopedics;  Laterality: Right;  ? IR ANGIOGRAM SELECTIVE EACH ADDITIONAL VESSEL  07/26/2018  ? IR ANGIOGRAM SELECTIVE EACH ADDITIONAL VESSEL  07/26/2018  ? IR ANGIOGRAM SELECTIVE EACH ADDITIONAL VESSEL  07/26/2018  ? IR EMBO ART  VEN HEMORR LYMPH EXTRAV  INC GUIDE ROADMAPPING  07/26/2018  ? IR GASTROSTOMY TUBE MOD SED  12/14/2018  ? IR GASTROSTOMY TUBE MOD SED  05/28/2021  ? IR US GUIDE VASC ACCESS RIGHT  07/26/2018  ? JOINT REPLACEMENT    ? KNEE ARTHROSCOPY Right X 1  ? PERIPHERAL VASCULAR BALLOON ANGIOPLASTY  03/05/2021  ? Procedure: PERIPHERAL VASCULAR BALLOON ANGIOPLASTY;  Surgeon: Leonie Douglas, MD;  Location: [facility] IN[facility]AS[facility] CV [facility];  Service: Cardiovascular;;  DCB Popliteal  ? PERIPHERAL VASCULAR INTERVENTION  03/05/2021  ? Procedure: PERIPHERAL VASCULAR INTERVENTION;  Surgeon: Leonie Douglas, MD;  Location: [facility]VAS[facility] CV LAB;  Service: Cardiovascular;;  ? TOTAL HIP ARTHROPLASTY Right 2009  ? TRANSCAROTID ARTERY REVASCULARIZATION?  Right 02/24/2021  ? Procedure: RIGHT TRANSCAROTID ARTERY REVASCULARIZATION;  Surgeon: Leonie Douglas, MD;  Location: [facility] OR;  Service: Vascular;  Laterality: Right;  ? ULTRASOUND GUIDANCE FOR VASCULAR ACCESS Left 02/24/2021  ? Procedure: ULTRASOUND GUIDANCE FOR VASCULAR ACCESS;  Surgeon: Leonie Douglas, MD;  Location: [facility] OR;  Service: Vascular;  Laterality: Left;  ? VIDEO ASSISTED THORACOSCOPY (VATS)/ LOBECTOMY Right 10/18/2018  ? Procedure: VIDEO ASSISTED  THORACOSCOPY (VATS)/ right lower LOBECTOMY;  Surgeon: Loreli Slot, MD;  Location: [facility] OR;  Service: Thoracic;  Laterality: Right;  ? VIDEO ASSISTED THORACOSCOPY (VATS)/DECORTICATION Right 11/09/2018  ? Procedure: REDO VIDEO ASSISTED THORACOSCOPY (VATS)/DECORTICATION/DRAIN EFFUSION;  Surgeon: Loreli Slot, MD;  Location: [facility] OR;  Service: Thoracic;  Laterality: Right;  ? VIDEO BRONCHOSCOPY N/A 07/27/2018  ? Procedure: VIDEO BRONCHOSCOPY WITHOUT FLUORO;  Surgeon: Leslye Peer, MD;  Location: [facility] OR;  Service: Cardiopulmonary;  Laterality: N/A;  ? VIDEO BRONCHOSCOPY N/A 11/09/2018  ? Procedure: VIDEO BRONCHOSCOPY;  Surgeon: Loreli Slot, MD;  Location: [facility] OR;  Service: Thoracic;  Laterality: N/A;  ? ?Social History:  reports that he quit smoking about 6 years ago. His smoking use included cigarettes. He has a 55.00 pack-year smoking history. He has never used smokeless tobacco. He reports that he does not drink alcohol and does not use drugs. ? ?Allergies  ?Allergen Reactions  ? Testosterone Rash  ? ? ?Family History  ?Problem Relation Age of Onset  ? Cancer Father 41  ?     Brain  ? Dementia Mother   ? ? ?Prior to Admission medications   ?Medication Sig Start Date End Date Taking? Authorizing Provider  ?acetaminophen (TYLENOL) 500 MG tablet Take 500 mg by mouth every 8 (eight) hours as needed for moderate pain. 10/28/19  Yes [provider]  ?albuterol (PROVENTIL) (2.5 MG/3ML) 0.083% nebulizer solution Take 3 mLs (2.5 mg total) by nebulization every 6 (six) hours as needed for wheezing or shortness of breath. 05/07/21  Yes Parrett, Tammy S, NP  ?albuterol (VENTOLIN HFA) 108 (90 Base) MCG/ACT inhaler Inhale 2 puffs into the lungs every 4 (four) hours as needed for wheezing or shortness of breath. 07/11/18  Yes Hongalgi, Maximino Greenland, MD  ?aspirin EC 81 MG tablet Take 81 mg by mouth daily. Swallow whole.   Yes [provider]  ?atorvastatin (LIPITOR) 80 MG tablet Take 1 tablet (80 mg  total) by mouth at bedtime. 02/25/21  Yes Lars Mage, PA-C  ?clopidogrel (PLAVIX) 75 MG tablet Take 1 tablet (75 mg total) by mouth daily. 01/12/21  Yes Leonie Douglas, MD  ?diclofenac sodium (VOLTAREN) 1 % GEL Apply 2 g topically 4 (four) times daily as needed (pain).    Yes [provider]  ?enoxaparin (LOVENOX) 40 MG/0.4ML injection Inject 0.4 mLs (40 mg total) into the skin daily for 15 days. 06/03/21 06/18/21 Yes Tyrone Nine, MD  ?Propylene Glycol (SYSTANE COMPLETE) 0.6 % SOLN Place 1 drop into both eyes daily.   Yes [provider]  ?benzonatate (TESSALON) 200 MG capsule Take 1 capsule (200 mg total) by mouth 3 (three) times daily as needed for cough. 02/17/21   Chilton Greathouse, MD  ?Cholecalciferol (VITAMIN D) 50 MCG (2000 UT) tablet Take 2,000 Units by mouth daily.    [provider]  ?fluticasone-salmeterol (WIX[facility]A INHUB) 250-50 MCG/ACT AEPB Inhale 1 puff into the lungs in the morning and at bedtime. 05/07/21   Parrett, Virgel Bouquet, NP  ?guaiFENesin (MUCINEX) 600 MG 12 hr tablet Take  600 mg by mouth at bedtime as needed for cough.    [provider]  ?lidocaine (LIDODERM) 5 % Place 1 patch onto the skin daily as needed (pain).     [provider]  ?melatonin 3 MG TABS tablet Take 1 tablet (3 mg total) by mouth at bedtime. 06/01/21   Lorin Glass, MD  ?methocarbamol (ROBAXIN) 500 MG tablet Take 1 tablet (500 mg total) by mouth 2 (two) times daily as needed for muscle spasms. 04/16/20   Marcello Fennel, MD  ?metoprolol tartrate (LOPRESSOR) 25 MG tablet Take 12.5 mg by mouth daily.    [provider]  ?Multiple Vitamins-Minerals (MULTIVITAMIN WITH MINERALS) tablet Take 1 tablet by mouth daily.    [provider]  ?nutrition supplement, JUVEN, (JUVEN) PACK Place 1 packet into feeding tube 2 (two) times daily between meals. 06/01/21   Lorin Glass, MD  ?Nutritional Supplements (FEEDING SUPPLEMENT, OSMOLITE 1.5 CAL,) LIQD Place 1,000 mLs into feeding  tube continuous. 06/01/21   Lorin Glass, MD  ?Nutritional Supplements (FEEDING SUPPLEMENT, PROSOURCE TF,) liquid Place 45 mLs into feeding tube 2 (two) times daily. 06/01/21   Lorin Glass, MD  ?pantopra

## 2021-06-10 NOTE — TOC Initial Note (Signed)
Transition of Care (TOC) - Initial/Assessment Note  ? ? ?Patient Details  ?Name: William Kirk ?MRN: [id number] ?Date of Birth: [name] 13, 1941 ? ?Transition of Care [facility]) CM/SW Contact:    ?Lanier Clam, RN ?Phone Number: ?06/10/2021, 3:30 PM ? ?Clinical Narrative:  From [facility] [address] to return rep Nikki following-PT eval ordered. Has PEG,dementia.              ? ? ?Expected Discharge Plan: Skilled Nursing Facility ?Barriers to Discharge: Continued Medical Work up ? ? ?Patient Goals and CMS Choice ?Patient states their goals for this hospitalization and ongoing recovery are:: [facility] return ?CMS Medicare.gov Compare Post Acute Care list provided to:: Patient Represenative (must comment) ([name] spouse) ?Choice offered to / list presented to : Spouse ? ?Expected Discharge Plan and Services ?Expected Discharge Plan: Skilled Nursing Facility ?  ?Discharge Planning Services: CM Consult ?Post Acute Care Choice: Skilled Nursing Facility ?Living arrangements for the past 2 months: Skilled Nursing Facility ?                ?  ?  ?  ?  ?  ?  ?  ?  ?  ?  ? ?Prior Living Arrangements/Services ?Living arrangements for the past 2 months: Skilled Nursing Facility ?  ?Patient language and need for interpreter reviewed:: Yes ?Do you feel safe going back to the place where you live?: Yes      ?Need for Family Participation in Patient Care: Yes (Comment) ?Care giver support system in place?: Yes (comment) ?  ?Criminal Activity/Legal Involvement Pertinent to Current Situation/Hospitalization: No - Comment as needed ? ?Activities of Daily Living ?Home Assistive Devices/Equipment: Dan Humphreys (specify type) ?ADL Screening (condition at time of admission) ?Patient's cognitive ability adequate to safely complete daily activities?: No ?Is the patient deaf or have difficulty hearing?: Yes ?Does the patient have difficulty seeing, even when wearing glasses/contacts?: No ?Does the patient have difficulty concentrating, remembering, or making  decisions?: Yes ?Patient able to express need for assistance with ADLs?: Yes ?Does the patient have difficulty dressing or bathing?: Yes ?Independently performs ADLs?: No ?Communication: Independent ?Dressing (OT): Needs assistance ?Is this a change from baseline?: Pre-admission baseline ?Feeding: Independent ?Bathing: Needs assistance ?Is this a change from baseline?: Pre-admission baseline ?Toileting: Needs assistance ?Is this a change from baseline?: Pre-admission baseline ?In/Out Bed: Needs assistance ?Is this a change from baseline?: Pre-admission baseline ?Does the patient have difficulty walking or climbing stairs?: Yes ?Weakness of Legs: Both ?Weakness of Arms/Hands: Both ? ?Permission Sought/Granted ?Permission sought to share information with : Case Manager ?Permission granted to share information with : Yes, Verbal Permission Granted ? Share Information with NAME: Case Manager ?   ?   ?   ? ?Emotional Assessment ?Appearance:: Appears stated age ?Attitude/Demeanor/Rapport: Gracious ?Affect (typically observed): Accepting ?Orientation: : Oriented to Self ?Alcohol / Substance Use: Not Applicable ?Psych Involvement: No (comment) ? ?Admission diagnosis:  Thrombocytosis [D75.839] ?Acute kidney injury (nontraumatic) (HCC) [N17.9] ?AKI (acute kidney injury) (HCC) [N17.9] ?Normochromic normocytic anemia [D64.9] ?Patient Active Problem List  ? Diagnosis Date Noted  ? Normocytic anemia 06/10/2021  ? Grade I diastolic dysfunction 06/10/2021  ? Acute on chronic respiratory failure with hypoxia and hypercapnia (HCC) 05/21/2021  ? Respiratory acidosis 05/21/2021  ? Dehydration 05/20/2021  ? Leukocytosis 05/20/2021  ? 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 Chronic kidney disease, stage 3a (HCC) 05/20/2021  ? Right femoral fracture (HCC) 05/19/2021  ? Asymptomatic carotid artery stenosis without infarction, right 02/24/2021  ? Abnormality of gait 04/16/2020  ?  Muscle cramp 04/16/2020  ? 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 Empyema, right (HCC) 11/09/2018  ? Empyema (HCC) 11/06/2018  ? Pressure injury of skin 11/06/2018  ? 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 Hemoptysis 07/07/2018  ? AKI (acute kidney injury) (HCC) 07/07/2018  ? Acute respiratory failure (HCC) 07/07/2018  ? Chronic obstructive pulmonary disease (HCC) 11/07/2016  ? Chronic respiratory failure with hypoxia (HCC) 11/07/2016  ? Pulmonary infiltrates 11/07/2016  ? Swallowing dysfunction 09/26/2016  ? Depression 09/21/2016  ? Aspiration pneumonia (HCC) 09/21/2016  ? HTN (hypertension) 09/21/2016  ? HCAP (healthcare-associated pneumonia)   ? COPD with acute exacerbation (HCC) 06/21/2016  ? Acute respiratory failure with hypoxia (HCC) 06/21/2016  ? Hypokalemia 06/21/2016  ? Hyponatremia 06/21/2016  ? Thrombocytosis 06/21/2016  ? PVD (peripheral vascular disease) (HCC) 06/21/2016  ? Bronchiectasis (HCC) 06/21/2016  ? Protein-calorie malnutrition, severe (HCC) 06/21/2016  ? Multifocal pneumonia 06/20/2016  ? FUO (fever of unknown origin) 04/15/2015  ? 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 Other tear of medial meniscus, current injury, unspecified knee, initial encounter 12/03/2012  ? ?PCP:  Floreen Comber, PA-C ?Pharmacy:   ?[facility] - [place], [place] - [address] ?[phone] ?[place] [address] ?Phone: [phone] Fax: [phone] ? ? ? ? ?Social Determinants of Health (SDOH) Interventions ?  ? ?Readmission Risk Interventions ? ?  02/25/2021  ?  2:25 PM 11/08/2018  ? 11:19 AM 10/23/2018  ?  8:44 AM  ?Readmission Risk Prevention Plan  ?Post Dischage Appt Complete    ?Medication Screening Complete    ?Transportation Screening Complete Complete Complete  ?PCP or Specialist Appt within 3-5 Days  Complete Complete  ?HRI or Home Care Consult   Complete Complete  ?Social Work Consult for Recovery Care Planning/Counseling  Complete Complete  ?Palliative Care Screening  Not Applicable Not Applicable  ?Medication Review Oceanographer)  Complete Complete  ? ? ? ?

## 2021-06-10 NOTE — Progress Notes (Signed)
Initial Nutrition Assessment ? ?DOCUMENTATION CODES:  ? ?Severe malnutrition in context of chronic illness ? ?INTERVENTION:  ?- will order 1 carton (237 ml) Osmolite 1.5 x5/day with 45 ml Prosource Plus x5, 1 packet Juven BID, and 30 ml free water before and 30 ml free water after each TF bolus while current IV fluids are ordered. ? ?- this regimen will provide 2165 kcal (98% kcal need), 134 grams protein, and 1205 ml free water.  ? ?- weigh patient today.  ? ?NUTRITION DIAGNOSIS:  ? ?Severe Malnutrition related to chronic illness as evidenced by severe fat depletion, severe muscle depletion. ? ?GOAL:  ? ?Patient will meet greater than or equal to 90% of their needs ? ?MONITOR:  ? ?TF tolerance, Labs, Weight trends, Skin ? ?REASON FOR ASSESSMENT:  ? ?Malnutrition Screening Tool, Consult ?Enteral/tube feeding initiation and management ? ?ASSESSMENT:  ? ?81 y.o. male with medical history of anxiety, depression, dementia, osteoarthritis of the hands, PUD, bleeding gastric ulcer, COPD, chronic cough, dyspnea, HTN, PVD, and multiple episodes of PNA. He had PEG placed on 05/28/21 by IR at [facility]. He presented to the ED from [facility] due to dyspnea. He is confused at baseline. ? ?Pharmacist alerted RD to patient and need for update to tube feeding order d/t concern for aspiration. ? ?Patient was being followed by RDs within [facility] during hospitalization in 05/2021 with last note being on 05/29/21. At that time, patient was NPO and receiving Osmolite 1.5 at 60 ml/hr with 45 ml Prosource TF BID, Juven BID, and 160 ml free water x6/day.  ? ?Patient noted to be confused during RD visit today. His wife was at bedside and able to provide information.  ? ?She shares that once he moved to [facility] he was on bolus TF via PEG. She shares that in the hospital he was allowed ice chips but once he moved to [facility] they kept him strictly NPO. She shares that staff at [facility] made her aware that patient was sneaking  sips of water. ? ?Discussed continuous vs bolus TF regimen. At this time will proceed with bolus regimen as he was tolerating this well and RD suspects that aspiration may have been from water for patient who was to be strictly NPO.  ? ?He has not been weighed since 06/02/21. ? ? ?Labs reviewed; CBG: 95 mg/dl, BUN: 63 mg/dl, creatinine: 1.48 mg/dl, GFR: 48 ml/min. ? ?Medications reviewed; 3 mg melatonin per PEG/night, 15 ml multivitamin per PEG/day, 40 mg protonix per PEG/day. ? ?IVF; 1/2 NS @ 150 ml/hr (3600 ml/24 hrs). ?  ? ?NUTRITION - FOCUSED PHYSICAL EXAM: ? ?Flowsheet Row Most Recent Value  ?Orbital Region Mild depletion  ?Upper Arm Region Moderate depletion  ?Thoracic and Lumbar Region Severe depletion  ?Buccal Region Moderate depletion  ?Temple Region No depletion  ?Clavicle Bone Region Severe depletion  ?Clavicle and Acromion Bone Region Severe depletion  ?Scapular Bone Region Severe depletion  ?Dorsal Hand Severe depletion  ?Patellar Region Severe depletion  ?Anterior Thigh Region Severe depletion  ?Posterior Calf Region Severe depletion  ?Edema (RD Assessment) None  ?Hair Reviewed  ?Eyes Reviewed  ?Mouth Reviewed  ?Skin Reviewed  ?Nails Reviewed  ? ?  ? ? ?Diet Order:   ?Diet Order   ? ? None  ? ?  ? ? ?EDUCATION NEEDS:  ? ?Education needs have been addressed ? ?Skin:  Skin Assessment: Skin Integrity Issues: ?Skin Integrity Issues:: Other (Comment), Unstageable ?Unstageable: full thickness to L foot and R  heel ?Incisions: closed R hip (from 05/21/21) ?Other: MASD to bilateral buttocks ? ?Last BM:  PTA/unknown ? ?Height:  ? ?Ht Readings from Last 1 Encounters:  ?06/10/21 '5\' 10"'$  (1.778 m)  ? ? ?Weight:  ? ?Wt Readings from Last 1 Encounters:  ?06/02/21 71.2 kg  ? ? ? ?BMI:  Body mass index is 22.52 kg/m?. ? ?Estimated Nutritional Needs:  ?Kcal:  2200-2500 kcal ?Protein:  110-125 grams ?Fluid:  >/= 2.2 L/day ? ? ? ? ?Jarome Matin, MS, RD, LDN ?Registered Dietitian II ?Inpatient Clinical Nutrition ?RD pager  # and on-call/weekend pager # available in [place]  ? ?

## 2021-06-10 NOTE — Progress Notes (Signed)
[facility] admitting physician addendum: ? ?The nursing staff reported that the patient was very restless earlier, high risk of losing IV and pulling telemetry/pulse ox cables with inability to reorient.  Haloperidol 4 mg IVP x1 dose ordered. ? ?Tennis Must, MD. ? ? ?

## 2021-06-10 NOTE — ED Notes (Signed)
Pt has not produced urine.  ?

## 2021-06-11 ENCOUNTER — Observation Stay (HOSPITAL_COMMUNITY): Payer: No Typology Code available for payment source

## 2021-06-11 DIAGNOSIS — N179 Acute kidney failure, unspecified: Secondary | ICD-10-CM | POA: Diagnosis not present

## 2021-06-11 LAB — CBC
HCT: 26.1 % — ABNORMAL LOW (ref 39.0–52.0)
Hemoglobin: 7.9 g/dL — ABNORMAL LOW (ref 13.0–17.0)
MCH: 27.2 pg (ref 26.0–34.0)
MCHC: 30.3 g/dL (ref 30.0–36.0)
MCV: 90 fL (ref 80.0–100.0)
Platelets: 390 10*3/uL (ref 150–400)
RBC: 2.9 MIL/uL — ABNORMAL LOW (ref 4.22–5.81)
RDW: 16.6 % — ABNORMAL HIGH (ref 11.5–15.5)
WBC: 9.9 10*3/uL (ref 4.0–10.5)
nRBC: 0 % (ref 0.0–0.2)

## 2021-06-11 LAB — COMPREHENSIVE METABOLIC PANEL
ALT: 26 U/L (ref 0–44)
AST: 27 U/L (ref 15–41)
Albumin: 2.6 g/dL — ABNORMAL LOW (ref 3.5–5.0)
Alkaline Phosphatase: 93 U/L (ref 38–126)
Anion gap: 9 (ref 5–15)
BUN: 47 mg/dL — ABNORMAL HIGH (ref 8–23)
CO2: 23 mmol/L (ref 22–32)
Calcium: 8.3 mg/dL — ABNORMAL LOW (ref 8.9–10.3)
Chloride: 112 mmol/L — ABNORMAL HIGH (ref 98–111)
Creatinine, Ser: 0.95 mg/dL (ref 0.61–1.24)
GFR, Estimated: >60 mL/min (ref >60)
Glucose, Bld: 106 mg/dL — ABNORMAL HIGH (ref 70–99)
Potassium: 3.9 mmol/L (ref 3.5–5.1)
Sodium: 144 mmol/L (ref 135–145)
Total Bilirubin: 0.5 mg/dL (ref 0.3–1.2)
Total Protein: 6.3 g/dL — ABNORMAL LOW (ref 6.5–8.1)

## 2021-06-11 LAB — URINE CULTURE

## 2021-06-11 LAB — GLUCOSE, CAPILLARY
Glucose-Capillary: 103 mg/dL — ABNORMAL HIGH (ref 70–99)
Glucose-Capillary: 103 mg/dL — ABNORMAL HIGH (ref 70–99)
Glucose-Capillary: 105 mg/dL — ABNORMAL HIGH (ref 70–99)
Glucose-Capillary: 122 mg/dL — ABNORMAL HIGH (ref 70–99)
Glucose-Capillary: 94 mg/dL (ref 70–99)
Glucose-Capillary: 99 mg/dL (ref 70–99)

## 2021-06-11 MED ORDER — IPRATROPIUM-ALBUTEROL 0.5-2.5 (3) MG/3ML IN SOLN
3.0000 mL | Freq: Two times a day (BID) | RESPIRATORY_TRACT | Status: DC
  Administered 2021-06-11 – 2021-06-12 (×2): 3 mL via RESPIRATORY_TRACT
  Filled 2021-06-11 (×2): qty 3

## 2021-06-11 NOTE — Evaluation (Signed)
Physical Therapy Evaluation ?Patient Details ?Name: William Kirk ?MRN: [id number] ?DOB: 1940/03/07 ?Today's Date: 06/11/2021 ? ?History of Present Illness ? STORY BRAKEMAN is a 81 y.o. male with medical history significant of anxiety, depression, dementia, osteoarthritis of the hands, PUD, history of bleeding gastric ulcer, COPD, chronic cough, dyspnea, hypertension, PVD, multiple episodes of pneumonia who is sent from his facility due to dyspnea.R ORIF 4/14 replacing femoral component of previous THA  ?Clinical Impression ? Pt admitted with above diagnosis.  Pt currently with functional limitations due to the deficits listed below (see PT Problem List). Pt will benefit from skilled PT to increase their independence and safety with mobility to allow discharge to the venue listed below.  Pt able to maintain 20% PWB in standing with cueing.  His UE strength is an asset with his mobility.  Recommend returning to SNF for further rehab before returning home.  Sidestepping at EOB was very fatiguing him. ?   ?   ? ?Recommendations for follow up therapy are one component of a multi-disciplinary discharge planning process, led by the attending physician.  Recommendations may be updated based on patient status, additional functional criteria and insurance authorization. ? ?Follow Up Recommendations Skilled nursing-short term rehab (<3 hours/day) ? ?  ?Assistance Recommended at Discharge Frequent or constant Supervision/Assistance  ?Patient can return home with the following ? A lot of help with walking and/or transfers;A lot of help with bathing/dressing/bathroom ? ?  ?Equipment Recommendations None recommended by PT  ?Recommendations for Other Services ?    ?  ?Functional Status Assessment Patient has had a recent decline in their functional status and demonstrates the ability to make significant improvements in function in a reasonable and predictable amount of time.  ? ?  ?Precautions / Restrictions  Precautions ?Precautions: Posterior Hip;Fall;Other (comment) (PEG tube) ?Precaution Comments: pt cannot verbalize precautions ?Restrictions ?Weight Bearing Restrictions: Yes ?RLE Weight Bearing: Partial weight bearing ?RLE Partial Weight Bearing Percentage or Pounds: 20  ? ?  ? ?Mobility ? Bed Mobility ?Overal bed mobility: Needs Assistance ?Bed Mobility: Supine to Sit, Sit to Supine ?  ?  ?Supine to sit: Min assist ?Sit to supine: Mod assist ?  ?General bed mobility comments: Good strength in UE to assist himself with supine <> sit and needed some A for R LE ?  ? ?Transfers ?Overall transfer level: Needs assistance ?Equipment used: Rolling walker (2 wheels) ?Transfers: Sit to/from Stand ?Sit to Stand: Min assist, From elevated surface ?  ?  ?  ?  ?  ?General transfer comment: Stood 2x with MIN A from elevated surface and was able to maintain <20 % WB on R LE with more of a TTWB. ?  ? ?Ambulation/Gait ?Ambulation/Gait assistance: Mod assist ?Gait Distance (Feet): 2 Feet ?Assistive device: Rolling walker (2 wheels) ?  ?  ?  ?  ?General Gait Details: Side stepping to [facility] and fair ability to maintain PWB status. Very fatigued with this. o2 89% once supine and increased to 91% within 30 seconds, but 2/4 dyspnea. ? ?Stairs ?  ?  ?  ?  ?  ? ?Wheelchair Mobility ?  ? ?Modified Rankin (Stroke Patients Only) ?  ? ?  ? ?Balance Overall balance assessment: History of Falls, Needs assistance ?  ?Sitting balance-Leahy Scale: Good ?  ?  ?  ?Standing balance-Leahy Scale: Poor ?  ?  ?  ?  ?  ?  ?  ?  ?  ?  ?  ?  ?   ? ? ? ?  Pertinent Vitals/Pain Pain Assessment ?Pain Assessment: Faces ?Faces Pain Scale: Hurts a little bit ?Pain Location: R LE with hip abduction ?Pain Descriptors / Indicators: Grimacing ?Pain Intervention(s): Limited activity within patient's tolerance, Monitored during session, Repositioned  ? ? ?Home Living Family/patient expects to be discharged to:: Skilled nursing facility ?  ?  ?  ?  ?  ?  ?  ?  ?  ?   ?   ?Prior Function   ?  ?  ?  ?  ?  ?  ?Mobility Comments: walking independently prior to fx in April, at [place] since d/c from hospital ?  ?  ? ? ?Hand Dominance  ?   ? ?  ?Extremity/Trunk Assessment  ? Upper Extremity Assessment ?Upper Extremity Assessment: Overall WFL for tasks assessed ?  ? ?Lower Extremity Assessment ?Lower Extremity Assessment: RLE deficits/detail ?RLE Deficits / Details: no active dorsiflexion, AAROM to maintain hip precautions. ?  ? ?Cervical / Trunk Assessment ?Cervical / Trunk Assessment: Kyphotic  ?Communication  ? Communication: No difficulties  ?Cognition Arousal/Alertness: Awake/alert ?Behavior During Therapy: Flat affect ?Overall Cognitive Status: History of cognitive impairments - at baseline ?  ?  ?  ?  ?  ?  ?  ?  ?  ?  ?  ?  ?  ?  ?  ?  ?  ?  ?  ? ?  ?General Comments   ? ?  ?Exercises Total Joint Exercises ?Ankle Circles/Pumps: PROM, Right, 5 reps ?Heel Slides: AAROM, Right, 5 reps ?Hip ABduction/ADduction: AAROM, Right, 5 reps  ? ?Assessment/Plan  ?  ?PT Assessment    ?PT Problem List Decreased range of motion;Decreased strength;Decreased balance;Decreased activity tolerance;Decreased mobility;Decreased cognition;Decreased knowledge of use of DME;Decreased safety awareness;Decreased knowledge of precautions;Pain ? ?   ?  ?PT Treatment Interventions DME instruction;Gait training;Functional mobility training;Therapeutic exercise;Therapeutic activities;Balance training   ? ?PT Goals (Current goals can be found in the Care Plan section)  ?Acute Rehab PT Goals ?Patient Stated Goal: agrees he wants to walk again ?PT Goal Formulation: With patient ?Time For Goal Achievement: 06/25/21 ?Potential to Achieve Goals: Good ? ?  ?Frequency Min 2X/week ?  ? ? ?Co-evaluation   ?  ?  ?  ?  ? ? ?  ?AM-PAC PT "6 Clicks" Mobility  ?Outcome Measure Help needed turning from your back to your side while in a flat bed without using bedrails?: A Little ?Help needed moving from lying on your back to  sitting on the side of a flat bed without using bedrails?: A Little ?Help needed moving to and from a bed to a chair (including a wheelchair)?: A Little ?Help needed standing up from a chair using your arms (e.g., wheelchair or bedside chair)?: A Little ?Help needed to walk in hospital room?: A Lot ?Help needed climbing 3-5 steps with a railing? : Total ?6 Click Score: 15 ? ?  ?End of Session   ?Activity Tolerance: Patient tolerated treatment well;Patient limited by fatigue ?Patient left: in bed;with call bell/phone within reach;with bed alarm set ?Nurse Communication: Mobility status (nurse tech) ?PT Visit Diagnosis: Other abnormalities of gait and mobility (R26.89);History of falling (Z91.81) ?  ? ?Time: 7564-3329 ?PT Time Calculation (min) (ACUTE ONLY): 28 min ? ? ?Charges:   PT Evaluation ?$PT Eval Moderate Complexity: 1 Mod ?PT Treatments ?$Therapeutic Activity: 8-22 mins ?  ?   ? ? ?Clydie Braun L. Katrinka Blazing, PT ? ?06/11/2021 ? ? ?Enzo Montgomery ?06/11/2021, 11:39 AM ? ?

## 2021-06-11 NOTE — Progress Notes (Signed)
? ? ? ?  Subjective: ?Patient alert today. Wife at bedside. Doing well in regards to the right hip. Continues to have R foot weakness. Was supposed to be seen yesterday morning for follow up, but was sent from facility to ED and admitted for an AKI. ? ?Objective:  ? ?VITALS:   ?Vitals:  ? 06/10/21 2102 06/11/21 0451 06/11/21 0500 06/11/21 0831  ?BP: (!) 110/45 (!) 130/50    ?Pulse: 83 82    ?Resp: 16 18    ?Temp: 97.9 ?F (36.6 ?C) 98.6 ?F (37 ?C)    ?TempSrc: Oral Oral    ?SpO2: 96% 93%  94%  ?Weight:   73.5 kg   ?Height:      ? ?Intact pulses distally ?Plantar flexion intact, but unable to demonstrate active ankle dorsiflexion or EHL function ?Skin checked under prafo, skin intact no sores ?Incision: incision c/d/I, staples removed ? ? ?Lab Results  ?Component Value Date  ? WBC 9.9 06/11/2021  ? HGB 7.9 (L) 06/11/2021  ? HCT 26.1 (L) 06/11/2021  ? MCV 90.0 06/11/2021  ? PLT 390 06/11/2021  ? ?BMET ?   ?Component Value Date/Time  ? NA 144 06/11/2021 0456  ? K 3.9 06/11/2021 0456  ? CL 112 (H) 06/11/2021 0456  ? CO2 23 06/11/2021 0456  ? GLUCOSE 106 (H) 06/11/2021 0456  ? BUN 47 (H) 06/11/2021 0456  ? CREATININE 0.95 06/11/2021 0456  ? CALCIUM 8.3 (L) 06/11/2021 0456  ? GFRNONAA >60 06/11/2021 0456  ? ? ? ?Assessment/Plan: ?   ? ?Principal Problem: ?  AKI (acute kidney injury) ([place]) ?Active Problems: ?  H/O total hip arthroplasty ?  Thrombocytosis ?  PVD (peripheral vascular disease) ([place]) ?  Bronchiectasis ([place]) ?  Depression ?  HTN (hypertension) ?  Chronic obstructive pulmonary disease (HCC) ?  Chronic respiratory failure with hypoxia (HCC) ?  Leukocytosis ?  Mixed hyperlipidemia ?  Chronic kidney disease, stage 3a ([place]) ?  Normocytic anemia ? ?S/p revision THA for periprosthetic femur fracture 4/14 ? ?Post op foot drop: AFO ordered, likely intraop stretch injury, no significant swelling in the buttock area and not having much pain so low concern for nerve laceration.  patient endorses intact sensation to dorsal  foot, but given his altered mental status may not be a reliable exam. Will continue to monitor. ? ?Staples removed 5/5 ? ? ?Post op recs: ?WB: 20% PWB RLE, posterior hip precautions x6 weeks ?PT for mobilization ?Imaging: repeat xrays 5/5 demonstrate stable alignment of the periprosthetic fracture good alignment of hardware no adverse features. ?Dressing: given patient incontinence keep incision area covered with mepilex dressing. Change PRN is soiled or saturated. ?DVT prophylaxis: lovenox '40mg'$  daily x 4 weeks  ?Follow up: 1 week post discharge with Dr. Zachery Dakins at [facility].  ?Address: [address] [place], [place], [place] [address]  ?Office Phone: [phone] ? ? ? ?Kenneth Lax A Danese Dorsainvil ?06/11/2021, 1:41 PM ? ? ?Charlies Constable, MD ? ?Contact information:   ?Weekdays 7am-5pm epic message Dr. Zachery Dakins, or call office for patient follow up: [phone] ?After hours and holidays please check [email or web address] for group call information for [facility] ? ?  ?

## 2021-06-11 NOTE — [place] FL2 (Signed)
?[place] MEDICAID FL2 LEVEL OF CARE SCREENING TOOL  ?  ? ?IDENTIFICATION  ?Patient Name: ?William Kirk Birthdate: 12/20/1940 Sex: male Admission Date (Current Location): ?06/10/2021  ?[place] Number: ? [place] ?  Facility and Address:  ?[facility],  [address], [place] [address] ?     Provider Number: ?[id number]  ?Attending Physician Name and Address:  ?Lanae Boast, MD ? Relative Name and Phone Number:  ?[name] spouse [phone] ?   ?Current Level of Care: ?Hospital Recommended Level of Care: ?Skilled Nursing Facility Prior Approval Number: ?  ? ?Date Approved/Denied: ?  PASRR Number: ?[id number] F ? ?Discharge Plan: ?SNF ?  ? ?Current Diagnoses: ?Patient Active Problem List  ? Diagnosis Date Noted  ? Normocytic anemia 06/10/2021  ? Grade I diastolic dysfunction 06/10/2021  ? Acute on chronic respiratory failure with hypoxia and hypercapnia (HCC) 05/21/2021  ? Respiratory acidosis 05/21/2021  ? Dehydration 05/20/2021  ? Leukocytosis 05/20/2021  ? 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 Chronic kidney disease, stage 3a (HCC) 05/20/2021  ? Right femoral fracture (HCC) 05/19/2021  ? Asymptomatic carotid artery stenosis without infarction, right 02/24/2021  ? Abnormality of gait 04/16/2020  ? Muscle cramp 04/16/2020  ? 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 Empyema, right (HCC) 11/09/2018  ? Empyema (HCC) 11/06/2018  ? Pressure injury of skin 11/06/2018  ? 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 Hemoptysis 07/07/2018  ? AKI (acute kidney injury) (HCC) 07/07/2018  ? Acute respiratory failure (HCC) 07/07/2018  ? Chronic obstructive pulmonary disease (HCC) 11/07/2016  ? Chronic respiratory failure with hypoxia (HCC) 11/07/2016  ? Pulmonary infiltrates 11/07/2016  ? Swallowing dysfunction 09/26/2016  ? Depression 09/21/2016  ? Aspiration pneumonia (HCC) 09/21/2016  ? HTN (hypertension) 09/21/2016  ? HCAP (healthcare-associated  pneumonia)   ? COPD with acute exacerbation (HCC) 06/21/2016  ? Acute respiratory failure with hypoxia (HCC) 06/21/2016  ? Hypokalemia 06/21/2016  ? Hyponatremia 06/21/2016  ? Thrombocytosis 06/21/2016  ? PVD (peripheral vascular disease) (HCC) 06/21/2016  ? Bronchiectasis (HCC) 06/21/2016  ? Protein-calorie malnutrition, severe (HCC) 06/21/2016  ? Multifocal pneumonia 06/20/2016  ? FUO (fever of unknown origin) 04/15/2015  ? 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 Other tear of medial meniscus, current injury, unspecified knee, initial encounter 12/03/2012  ? ? ?Orientation RESPIRATION BLADDER Height & Weight   ?  ?Self ? Normal Incontinent Weight: 73.5 kg ?Height:  5\' 10"  (177.8 cm)  ?BEHAVIORAL SYMPTOMS/MOOD NEUROLOGICAL BOWEL NUTRITION STATUS  ?    Incontinent Feeding tube (PEG)  ?AMBULATORY STATUS COMMUNICATION OF NEEDS Skin   ?Extensive Assist Verbally   ?  ?  ?  ?    ?     ?     ? ? ?Personal Care Assistance Level of Assistance  ?Bathing, Feeding, Dressing Bathing Assistance: Maximum assistance ?Feeding assistance: Independent ?Dressing Assistance: Maximum assistance ?   ? ?Functional Limitations Info  ?Sight, Hearing, Speech Sight Info: Impaired (eyeglasses) ?Hearing Info: Adequate ?Speech Info: Impaired (dentures-top/bottom)  ? ? ?SPECIAL CARE FACTORS FREQUENCY  ?PT (By licensed PT), OT (By licensed OT)   ?  ?  ?  ?  ?  ?  ?   ? ? ?Contractures Contractures Info: Not present  ? ? ?Additional Factors Info  ?Code Status Code Status Info:  (Full) ?Allergies Info:  (Testosterone) ?  ?  ?  ?   ? ?  Current Medications (06/11/2021):  This is the current hospital active medication list ?Current Facility-Administered Medications  ?Medication Dose Route Frequency Provider Last Rate Last Admin  ? acetaminophen (TYLENOL) tablet 650 mg  650 mg Oral Q6H PRN Bobette Mo, MD   650 mg at 06/11/21 0930  ? Or  ? acetaminophen  (TYLENOL) suppository 650 mg  650 mg Rectal Q6H PRN Bobette Mo, MD      ? aspirin chewable tablet 81 mg  81 mg Per Tube Daily Bobette Mo, MD   81 mg at 06/11/21 1610  ? atorvastatin (LIPITOR) tablet 80 mg  80 mg Per Tube QHS Bobette Mo, MD   80 mg at 06/10/21 2116  ? chlorhexidine (PERIDEX) 0.12 % solution 15 mL  15 mL Mouth Rinse BID Bobette Mo, MD   15 mL at 06/11/21 9604  ? clopidogrel (PLAVIX) tablet 75 mg  75 mg Per Tube Daily Bobette Mo, MD   75 mg at 06/11/21 0930  ? diclofenac Sodium (VOLTAREN) 1 % topical gel 2 g  2 g Topical QID PRN Bobette Mo, MD      ? enoxaparin (LOVENOX) injection 40 mg  40 mg Subcutaneous Q24H Bobette Mo, MD   40 mg at 06/11/21 5409  ? feeding supplement (OSMOLITE 1.5 CAL) liquid 237 mL  237 mL Per Tube 5 X Daily Bobette Mo, MD   237 mL at 06/11/21 1311  ? feeding supplement (PROSource TF) liquid 45 mL  45 mL Per Tube 5 X Daily Bobette Mo, MD   45 mL at 06/11/21 1315  ? free water 60 mL  60 mL Per Tube 5 X Daily Bobette Mo, MD   60 mL at 06/11/21 1317  ? guaiFENesin-dextromethorphan (ROBITUSSIN DM) 100-10 MG/5ML syrup 5 mL  5 mL Per Tube Q12H PRN Bobette Mo, MD      ? ipratropium-albuterol (DUONEB) 0.5-2.5 (3) MG/3ML nebulizer solution 3 mL  3 mL Nebulization BID Kc, Ramesh, MD      ? lidocaine (LIDODERM) 5 % 1 patch  1 patch Transdermal Daily PRN Bobette Mo, MD      ? MEDLINE mouth rinse  15 mL Mouth Rinse q12n4p Bobette Mo, MD   15 mL at 06/11/21 1318  ? melatonin tablet 3 mg  3 mg Per Tube QHS Bobette Mo, MD   3 mg at 06/10/21 2116  ? methocarbamol (ROBAXIN) tablet 500 mg  500 mg Per Tube QHS Bobette Mo, MD   500 mg at 06/10/21 2116  ? methocarbamol (ROBAXIN) tablet 500 mg  500 mg Per Tube BID PRN Bobette Mo, MD   500 mg at 06/11/21 0550  ? metoprolol tartrate (LOPRESSOR) tablet 12.5 mg  12.5 mg Per Tube Daily Bobette Mo, MD   12.5  mg at 06/11/21 8119  ? midodrine (PROAMATINE) tablet 2.5 mg  2.5 mg Per Tube BID PRN Bobette Mo, MD      ? mometasone-formoterol [facility]) 200-5 MCG/ACT inhaler 2 puff  2 puff Inhalation BID Bobette Mo, MD   2 puff at 06/11/21 0831  ? multivitamin liquid 15 mL  15 mL Per Tube Daily Bobette Mo, MD   15 mL at 06/11/21 1011  ? nutrition supplement (JUVEN) (JUVEN) powder packet 1 packet  1 packet Per Tube BID BM Bobette Mo, MD   1 packet at 06/11/21 1012  ? ondansetron (ZOFRAN) tablet 4 mg  4 mg  Oral Q6H PRN Bobette Mo, MD      ? Or  ? ondansetron [facility]) injection 4 mg  4 mg Intravenous Q6H PRN Bobette Mo, MD      ? oxyCODONE-acetaminophen (PERCOCET/ROXICET) 5-325 MG per tablet 1 tablet  1 tablet Per Tube Q6H PRN Bobette Mo, MD      ? pantoprazole sodium (PROTONIX) 40 mg/20 mL oral suspension 40 mg  40 mg Per Tube Daily Bobette Mo, MD   40 mg at 06/11/21 1400  ? QUEtiapine (SEROQUEL) tablet 25 mg  25 mg Per Tube Daily Bobette Mo, MD   25 mg at 06/11/21 0930  ? QUEtiapine (SEROQUEL) tablet 50 mg  50 mg Per Tube QHS Bobette Mo, MD   50 mg at 06/10/21 2116  ? sertraline (ZOLOFT) tablet 200 mg  200 mg Per Tube Daily Bobette Mo, MD   200 mg at 06/11/21 7846  ? ? ? ?Discharge Medications: ?Please see discharge summary for a list of discharge medications. ? ?Relevant Imaging Results: ? ?Relevant Lab Results: ? ? ?Additional Information ?ss#[id number];Moderna x2;Booster x2 ? ?Lanier Clam, RN ? ? ? ? ?

## 2021-06-11 NOTE — Progress Notes (Signed)
Orthopedic Tech Progress Note ?Patient Details:  ?TOWNSEND CUDWORTH ?1940-11-13 ?[id number] ? ?Patient ID: KESHAN REHA, male   DOB: 08-Dec-1940, 81 y.o.   MRN: [id number] ? ?[place] Pink ?06/11/2021, 1:56 PMCalled and routed right ankle foot orthosis order to [facility]. ? ?

## 2021-06-11 NOTE — TOC Progression Note (Addendum)
Transition of Care (TOC) - Progression Note  ? ? ?Patient Details  ?Name: William Kirk ?MRN: [id number] ?Date of Birth: 1941/01/31 ? ?Transition of Care (TOC) CM/SW Contact  ?Dessa Phi, RN ?Phone Number: ?06/11/2021, 3:34 PM ? ?Clinical Narrative:  Spoke to [facility] Herbert will initiate auth under [facility] Medicare(Non Navi)-await auth. Must be off haldol 48hrs prior d/c.MD updated. ? ? ? ?Expected Discharge Plan: [place] ?Barriers to Discharge: Continued Medical Work up ? ?Expected Discharge Plan and Services ?Expected Discharge Plan: [place] ?  ?Discharge Planning Services: CM Consult ?Post Acute Care Choice: [place] ?Living arrangements for the past 2 months: [place] ?                ?  ?  ?  ?  ?  ?  ?  ?  ?  ?  ? ? ?Social Determinants of Health (SDOH) Interventions ?  ? ?Readmission Risk Interventions ? ?  02/25/2021  ?  2:25 PM 11/08/2018  ? 11:19 AM 10/23/2018  ?  8:44 AM  ?Readmission Risk Prevention Plan  ?Post Dischage Appt Complete    ?Medication Screening Complete    ?Transportation Screening Complete Complete Complete  ?PCP or Specialist Appt within 3-5 Days  Complete Complete  ?[place] or Home Care Consult  Complete Complete  ?Social Work Consult for [place] Planning/Counseling  Complete Complete  ?Palliative Care Screening  Not Applicable Not Applicable  ?Medication Review [employer] photographer)  Complete Complete  ? ? ?

## 2021-06-11 NOTE — Hospital Course (Signed)
59 yom w/ significant hx of anxiety, depression, dementia, osteoarthritis of the hands, PUD, history of bleeding gastric ulcer, COPD, chronic cough, dyspnea, hypertension, PVD, multiple episodes of pneumonia  sent from facility due to dyspnea . He is confused at baseline and unable to provide meaningful history in ED.He is able to answer simple questions,he denied headache, sore throat, rhinorrhea, chest, back or abdominal pain, however, stated he felt mildly dyspneic. ? ?In HE:NIDPOE stable,Urinalysis was normal.  Lactic acid x2 unremarkable.  Normal PT, INR PTT. WBC- 15.8, hemoglobin 8.9 g/dL platelets 499.  CMP with normal electrolytes.  Glucose 181, BUN 63 and creatinine 1.48 mg/dL (last week creatinine was 0.87 and 0.82 mg/dL). LFTs only showed a low albumin at 2.9 g/dL but are otherwise normal. ?CXR-chronic lung disease with RLL lobectomy and improved aggregation on the right upper lobe since previous radiograph on 05/22/2021. ?Patient was admitted for AKI, on IV fluids, needed Haldol for his restlessness and agitation 5/4. ?Next morning AKI resolved, leukocytosis resolved, hemoglobin 7.9 g chronically has been in 7.1- 8.6 g ?Vital signs remain stable and he is doing well on room air. ?

## 2021-06-11 NOTE — Progress Notes (Signed)
Alert ?PROGRESS NOTE ?William Kirk  QMV:[id number] DOB: 1940/07/18 DOA: 06/10/2021 ?PCP: Floreen Comber, PA-C  ? ?Brief Narrative/Hospital Course: ?84 yom w/ significant hx of anxiety, depression, dementia, osteoarthritis of the hands, PUD, history of bleeding gastric ulcer, COPD, chronic cough, dyspnea, hypertension, PVD, multiple episodes of pneumonia  sent from facility due to dyspnea . He is confused at baseline and unable to provide meaningful history in ED.He is able to answer simple questions,he denied headache, sore throat, rhinorrhea, chest, back or abdominal pain, however, stated he felt mildly dyspneic. ? ?In MW:UXLKGM stable,Urinalysis was normal.  Lactic acid x2 unremarkable.  Normal PT, INR PTT. WBC- 15.8, hemoglobin 8.9 g/dL platelets 010.  CMP with normal electrolytes.  Glucose 181, BUN 63 and creatinine 1.48 mg/dL (last week creatinine was 0.87 and 0.82 mg/dL). LFTs only showed a low albumin at 2.9 g/dL but are otherwise normal. ?CXR-chronic lung disease with RLL lobectomy and improved aggregation on the right upper lobe since previous radiograph on 05/22/2021. ?Patient was admitted for AKI, on IV fluids, needed Haldol for his restlessness and agitation 5/4. ?Next morning AKI resolved, leukocytosis resolved, hemoglobin 7.9 g chronically has been in 7.1- 8.6 g ?Vital signs remain stable and he is doing well on room air.  ?Urine culture multiple species. ? ?Subjective: ?Seen and examined this morning. ?He is alert, awake pleasantly confused oriented to place, self current above ?Not in distress, on room air ? ?Assessment and Plan: ?Principal Problem: ?  AKI (acute kidney injury) (HCC) ?Active Problems: ?  H/O total hip arthroplasty ?  Thrombocytosis ?  PVD (peripheral vascular disease) (HCC) ?  Bronchiectasis (HCC) ?  Depression ?  HTN (hypertension) ?  Chronic obstructive pulmonary disease (HCC) ?  Chronic respiratory failure with hypoxia (HCC) ?  Leukocytosis ?  Mixed hyperlipidemia ?  Chronic  kidney disease, stage 3a (HCC) ?  Normocytic anemia ? ?AKI on Chronic kidney disease, stage 3a: Creatinine improved to 0.9.  Encourage p.o./hydration off IV fluids ?Recent Labs  ?Lab 06/10/21 ?0406 06/11/21 ?0456  ?BUN 63* 47*  ?CREATININE 1.48* 0.95  ?  ?Bronchiectasis ?COPD ?Chronic respiratory failure with hypoxia: ?Respiratory status stable, chest x-ray with chronic lung disease and other findings,see report.  Continue Dulera, DuoNeb, incentive spirometry ? ?Leukocytosis ?Thrombocytosis: ?Unclear etiology could be inconsideration/reactive resolved. ? ?Normocytic anemia: Baseline hemoglobin 7.1-8.6 g.  Monitor ?Recent Labs  ?Lab 06/10/21 ?0406 06/11/21 ?0456  ?HGB 8.9* 7.9*  ?HCT 28.3* 26.1*  ?   ?Depression: Mood is stable continue Seroquel,Zoloft. ? ?HTN ?PVD ?Mixed hyperlipidemia: ?BP stable continue aspirin, statin, Plavix and metoprolol.  Also on midodrine prn sbp <95. ? ?H/O total hip arthroplasty: Feeling well PT OT eval pending ? ?Pressure injury POA at multiple sites see below: ?Pressure Injury 06/10/21 Foot Anterior;Left Unstageable - Full thickness tissue loss in which the base of the injury is covered by slough (yellow, tan, gray, green or brown) and/or eschar (tan, brown or black) in the wound bed. small area with  scab (Active)  ?06/10/21 1319  ?Location: Foot  ?Location Orientation: Anterior;Left  ?Staging: Unstageable - Full thickness tissue loss in which the base of the injury is covered by slough (yellow, tan, gray, green or brown) and/or eschar (tan, brown or black) in the wound bed.  ?Wound Description (Comments): small area with  scab  ?Present on Admission: Yes  ?Dressing Type Foam - Lift dressing to assess site every shift 06/10/21 2020  ? ?Pressure Injury Heel Posterior;Right Unstageable - Full thickness tissue loss in which the  base of the injury is covered by slough (yellow, tan, gray, green or brown) and/or eschar (tan, brown or black) in the wound bed. previous stage 1 with large blist  (Active)  ?   ?Location: Heel  ?Location Orientation: Posterior;Right  ?Staging: Unstageable - Full thickness tissue loss in which the base of the injury is covered by slough (yellow, tan, gray, green or brown) and/or eschar (tan, brown or black) in the wound bed.  ?Wound Description (Comments): previous stage 1 with large blister now  ?Present on Admission: Yes  ?Dressing Type Foam - Lift dressing to assess site every shift 06/10/21 2020  ? ?DVT prophylaxis: enoxaparin (LOVENOX) injection 40 mg Start: 06/10/21 1000 ?SCDs Start: 06/10/21 0735 ?Code Status:   Code Status: Full Code ?Family Communication: plan of care discussed with patient/toc ? ?Patient status is: Admitted as observation, remains hospitalized because of disposition, PT OT evaluation ?Level of care: Telemetry  ? ?Dispo: The patient is from: SNF ?           Anticipated disposition: SNF once available ? ?Mobility Assessment (last 72 hours)   ? ? Mobility Assessment   ? ? Row Name 06/11/21 1136 06/10/21 2020 06/10/21 0847  ?  ?  ? Does patient have an order for bedrest or is patient medically unstable -- No - Continue assessment No - Continue assessment    ? What is the highest level of mobility based on the progressive mobility assessment? Level 3 (Stands with assist) - Balance while standing  and cannot march in place Level 2 (Chairfast) - Balance while sitting on edge of bed and cannot stand Level 2 (Chairfast) - Balance while sitting on edge of bed and cannot stand    ? Is the above level different from baseline mobility prior to current illness? -- Yes - Recommend PT order Yes - Recommend PT order    ? ?  ?  ? ?  ?  ? ?Objective: ?Vitals last 24 hrs: ?Vitals:  ? 06/10/21 2102 06/11/21 0451 06/11/21 0500 06/11/21 0831  ?BP: (!) 110/45 (!) 130/50    ?Pulse: 83 82    ?Resp: 16 18    ?Temp: 97.9 ?F (36.6 ?C) 98.6 ?F (37 ?C)    ?TempSrc: Oral Oral    ?SpO2: 96% 93%  94%  ?Weight:   73.5 kg   ?Height:      ? ?Weight change:  ? ?Physical  Examination: ?General exam: alert awake oriented x2,older than stated age, weak appearing. ?HEENT:Oral mucosa moist, Ear/Nose WNL grossly, dentition normal. ?Respiratory system: bilaterally diminished BS, no use of accessory muscle ?Cardiovascular system: S1 & S2 +, No JVD. ?Gastrointestinal system: Abdomen soft,NT,ND, BS+, PEG+ ?Nervous System:Alert, awake, moving extremities and grossly nonfocal ?Extremities: LE edema none,distal peripheral pulses palpable.  ?Skin: No rashes,no icterus. ?MSK: Normal muscle bulk,tone, power ? ?Medications reviewed:  ?Scheduled Meds: ? aspirin  81 mg Per Tube Daily  ? atorvastatin  80 mg Per Tube QHS  ? chlorhexidine  15 mL Mouth Rinse BID  ? clopidogrel  75 mg Per Tube Daily  ? enoxaparin (LOVENOX) injection  40 mg Subcutaneous Q24H  ? feeding supplement (OSMOLITE 1.5 CAL)  237 mL Per Tube 5 X Daily  ? feeding supplement (PROSource TF)  45 mL Per Tube 5 X Daily  ? free water  60 mL Per Tube 5 X Daily  ? ipratropium-albuterol  3 mL Nebulization BID  ? mouth rinse  15 mL Mouth Rinse q12n4p  ? melatonin  3 mg  Per Tube QHS  ? methocarbamol  500 mg Per Tube QHS  ? metoprolol tartrate  12.5 mg Per Tube Daily  ? mometasone-formoterol  2 puff Inhalation BID  ? multivitamin  15 mL Per Tube Daily  ? nutrition supplement (JUVEN)  1 packet Per Tube BID BM  ? pantoprazole sodium  40 mg Per Tube Daily  ? QUEtiapine  25 mg Per Tube Daily  ? QUEtiapine  50 mg Per Tube QHS  ? sertraline  200 mg Per Tube Daily  ? ?Continuous Infusions: ? ? ?Diet Order   ? ? None  ? ?  ?  ? ?Nutrition Problem: Severe Malnutrition ?Etiology: chronic illness ?Signs/Symptoms: severe fat depletion, severe muscle depletion ?Interventions: Tube feeding, Prostat, Juven ? ? ?Intake/Output Summary (Last 24 hours) at 06/11/2021 1240 ?Last data filed at 06/11/2021 0300 ?Gross per 24 hour  ?Intake 2107.17 ml  ?Output 1075 ml  ?Net 1032.17 ml  ? ?Net IO Since Admission: 1,032.17 mL [06/11/21 1240]  ?Wt Readings from Last 3  Encounters:  ?06/11/21 73.5 kg  ?06/02/21 71.2 kg  ?05/07/21 73.6 kg  ?  ? ?Unresulted Labs (From admission, onward)  ? ?  Start     Ordered  ? 06/17/21 0500  Creatinine, serum  (enoxaparin (LOVENOX)    CrCl >/= 30 ml/min)  W

## 2021-06-12 DIAGNOSIS — J449 Chronic obstructive pulmonary disease, unspecified: Secondary | ICD-10-CM | POA: Diagnosis not present

## 2021-06-12 DIAGNOSIS — M21371 Foot drop, right foot: Secondary | ICD-10-CM | POA: Diagnosis present

## 2021-06-12 DIAGNOSIS — J439 Emphysema, unspecified: Secondary | ICD-10-CM | POA: Diagnosis present

## 2021-06-12 DIAGNOSIS — Z888 Allergy status to other drugs, medicaments and biological substances status: Secondary | ICD-10-CM | POA: Diagnosis not present

## 2021-06-12 DIAGNOSIS — Y92129 Unspecified place in nursing home as the place of occurrence of the external cause: Secondary | ICD-10-CM | POA: Diagnosis not present

## 2021-06-12 DIAGNOSIS — J9611 Chronic respiratory failure with hypoxia: Secondary | ICD-10-CM

## 2021-06-12 DIAGNOSIS — R1313 Dysphagia, pharyngeal phase: Secondary | ICD-10-CM | POA: Diagnosis present

## 2021-06-12 DIAGNOSIS — Z79899 Other long term (current) drug therapy: Secondary | ICD-10-CM | POA: Diagnosis not present

## 2021-06-12 DIAGNOSIS — R1310 Dysphagia, unspecified: Secondary | ICD-10-CM | POA: Diagnosis not present

## 2021-06-12 DIAGNOSIS — R4189 Other symptoms and signs involving cognitive functions and awareness: Secondary | ICD-10-CM | POA: Diagnosis present

## 2021-06-12 DIAGNOSIS — Z8709 Personal history of other diseases of the respiratory system: Secondary | ICD-10-CM | POA: Diagnosis not present

## 2021-06-12 DIAGNOSIS — R2689 Other abnormalities of gait and mobility: Secondary | ICD-10-CM | POA: Diagnosis not present

## 2021-06-12 DIAGNOSIS — Z7902 Long term (current) use of antithrombotics/antiplatelets: Secondary | ICD-10-CM | POA: Diagnosis not present

## 2021-06-12 DIAGNOSIS — J9621 Acute and chronic respiratory failure with hypoxia: Secondary | ICD-10-CM | POA: Diagnosis not present

## 2021-06-12 DIAGNOSIS — Z8669 Personal history of other diseases of the nervous system and sense organs: Secondary | ICD-10-CM | POA: Diagnosis not present

## 2021-06-12 DIAGNOSIS — R4182 Altered mental status, unspecified: Secondary | ICD-10-CM | POA: Diagnosis not present

## 2021-06-12 DIAGNOSIS — R1312 Dysphagia, oropharyngeal phase: Secondary | ICD-10-CM | POA: Diagnosis not present

## 2021-06-12 DIAGNOSIS — S72142A Displaced intertrochanteric fracture of left femur, initial encounter for closed fracture: Secondary | ICD-10-CM | POA: Diagnosis not present

## 2021-06-12 DIAGNOSIS — E43 Unspecified severe protein-calorie malnutrition: Secondary | ICD-10-CM | POA: Diagnosis present

## 2021-06-12 DIAGNOSIS — Z9861 Coronary angioplasty status: Secondary | ICD-10-CM | POA: Diagnosis not present

## 2021-06-12 DIAGNOSIS — F0394 Unspecified dementia, unspecified severity, with anxiety: Secondary | ICD-10-CM | POA: Diagnosis present

## 2021-06-12 DIAGNOSIS — F29 Unspecified psychosis not due to a substance or known physiological condition: Secondary | ICD-10-CM | POA: Diagnosis not present

## 2021-06-12 DIAGNOSIS — I10 Essential (primary) hypertension: Secondary | ICD-10-CM

## 2021-06-12 DIAGNOSIS — N179 Acute kidney failure, unspecified: Secondary | ICD-10-CM | POA: Diagnosis not present

## 2021-06-12 DIAGNOSIS — I739 Peripheral vascular disease, unspecified: Secondary | ICD-10-CM | POA: Diagnosis not present

## 2021-06-12 DIAGNOSIS — D75839 Thrombocytosis, unspecified: Secondary | ICD-10-CM

## 2021-06-12 DIAGNOSIS — F32A Depression, unspecified: Secondary | ICD-10-CM

## 2021-06-12 DIAGNOSIS — Z809 Family history of malignant neoplasm, unspecified: Secondary | ICD-10-CM | POA: Diagnosis not present

## 2021-06-12 DIAGNOSIS — J438 Other emphysema: Secondary | ICD-10-CM | POA: Diagnosis not present

## 2021-06-12 DIAGNOSIS — J479 Bronchiectasis, uncomplicated: Secondary | ICD-10-CM | POA: Diagnosis not present

## 2021-06-12 DIAGNOSIS — W06XXXA Fall from bed, initial encounter: Secondary | ICD-10-CM | POA: Diagnosis present

## 2021-06-12 DIAGNOSIS — S7291XD Unspecified fracture of right femur, subsequent encounter for closed fracture with routine healing: Secondary | ICD-10-CM | POA: Diagnosis not present

## 2021-06-12 DIAGNOSIS — D509 Iron deficiency anemia, unspecified: Secondary | ICD-10-CM | POA: Diagnosis present

## 2021-06-12 DIAGNOSIS — Z87898 Personal history of other specified conditions: Secondary | ICD-10-CM | POA: Diagnosis not present

## 2021-06-12 DIAGNOSIS — M93971 Osteochondropathy, unspecified, right ankle and foot: Secondary | ICD-10-CM | POA: Diagnosis not present

## 2021-06-12 DIAGNOSIS — R262 Difficulty in walking, not elsewhere classified: Secondary | ICD-10-CM | POA: Diagnosis not present

## 2021-06-12 DIAGNOSIS — S72002A Fracture of unspecified part of neck of left femur, initial encounter for closed fracture: Secondary | ICD-10-CM | POA: Diagnosis not present

## 2021-06-12 DIAGNOSIS — M25552 Pain in left hip: Secondary | ICD-10-CM | POA: Diagnosis present

## 2021-06-12 DIAGNOSIS — Z7982 Long term (current) use of aspirin: Secondary | ICD-10-CM | POA: Diagnosis not present

## 2021-06-12 DIAGNOSIS — M6281 Muscle weakness (generalized): Secondary | ICD-10-CM | POA: Diagnosis not present

## 2021-06-12 DIAGNOSIS — M2550 Pain in unspecified joint: Secondary | ICD-10-CM | POA: Diagnosis not present

## 2021-06-12 DIAGNOSIS — I129 Hypertensive chronic kidney disease with stage 1 through stage 4 chronic kidney disease, or unspecified chronic kidney disease: Secondary | ICD-10-CM | POA: Diagnosis present

## 2021-06-12 DIAGNOSIS — L89611 Pressure ulcer of right heel, stage 1: Secondary | ICD-10-CM | POA: Diagnosis not present

## 2021-06-12 DIAGNOSIS — R918 Other nonspecific abnormal finding of lung field: Secondary | ICD-10-CM | POA: Diagnosis not present

## 2021-06-12 DIAGNOSIS — Z9181 History of falling: Secondary | ICD-10-CM | POA: Diagnosis not present

## 2021-06-12 DIAGNOSIS — W19XXXA Unspecified fall, initial encounter: Secondary | ICD-10-CM | POA: Diagnosis not present

## 2021-06-12 DIAGNOSIS — R2681 Unsteadiness on feet: Secondary | ICD-10-CM | POA: Diagnosis not present

## 2021-06-12 DIAGNOSIS — R944 Abnormal results of kidney function studies: Secondary | ICD-10-CM | POA: Diagnosis present

## 2021-06-12 DIAGNOSIS — D649 Anemia, unspecified: Secondary | ICD-10-CM | POA: Diagnosis not present

## 2021-06-12 DIAGNOSIS — N1831 Chronic kidney disease, stage 3a: Secondary | ICD-10-CM

## 2021-06-12 DIAGNOSIS — Z931 Gastrostomy status: Secondary | ICD-10-CM | POA: Diagnosis not present

## 2021-06-12 DIAGNOSIS — K254 Chronic or unspecified gastric ulcer with hemorrhage: Secondary | ICD-10-CM | POA: Diagnosis present

## 2021-06-12 DIAGNOSIS — E46 Unspecified protein-calorie malnutrition: Secondary | ICD-10-CM | POA: Diagnosis not present

## 2021-06-12 DIAGNOSIS — Z7189 Other specified counseling: Secondary | ICD-10-CM | POA: Diagnosis not present

## 2021-06-12 DIAGNOSIS — E782 Mixed hyperlipidemia: Secondary | ICD-10-CM

## 2021-06-12 DIAGNOSIS — S72145A Nondisplaced intertrochanteric fracture of left femur, initial encounter for closed fracture: Secondary | ICD-10-CM | POA: Diagnosis present

## 2021-06-12 DIAGNOSIS — I959 Hypotension, unspecified: Secondary | ICD-10-CM | POA: Diagnosis not present

## 2021-06-12 DIAGNOSIS — Z87891 Personal history of nicotine dependence: Secondary | ICD-10-CM | POA: Diagnosis not present

## 2021-06-12 DIAGNOSIS — E785 Hyperlipidemia, unspecified: Secondary | ICD-10-CM | POA: Diagnosis present

## 2021-06-12 DIAGNOSIS — Z7401 Bed confinement status: Secondary | ICD-10-CM | POA: Diagnosis not present

## 2021-06-12 DIAGNOSIS — F418 Other specified anxiety disorders: Secondary | ICD-10-CM | POA: Diagnosis not present

## 2021-06-12 DIAGNOSIS — D72829 Elevated white blood cell count, unspecified: Secondary | ICD-10-CM

## 2021-06-12 DIAGNOSIS — J189 Pneumonia, unspecified organism: Secondary | ICD-10-CM | POA: Diagnosis present

## 2021-06-12 DIAGNOSIS — Z96641 Presence of right artificial hip joint: Secondary | ICD-10-CM | POA: Diagnosis present

## 2021-06-12 DIAGNOSIS — F419 Anxiety disorder, unspecified: Secondary | ICD-10-CM | POA: Diagnosis not present

## 2021-06-12 DIAGNOSIS — N182 Chronic kidney disease, stage 2 (mild): Secondary | ICD-10-CM | POA: Diagnosis not present

## 2021-06-12 DIAGNOSIS — R41841 Cognitive communication deficit: Secondary | ICD-10-CM | POA: Diagnosis not present

## 2021-06-12 LAB — GLUCOSE, CAPILLARY
Glucose-Capillary: 107 mg/dL — ABNORMAL HIGH (ref 70–99)
Glucose-Capillary: 94 mg/dL (ref 70–99)
Glucose-Capillary: 95 mg/dL (ref 70–99)

## 2021-06-12 NOTE — Discharge Summary (Addendum)
Physician Discharge Summary  ?CACHE JACOBY UJW:[id number] DOB: 1940/10/10 DOA: 06/10/2021 ? ?PCP: Floreen Comber, PA-C ? ?Admit date: 06/10/2021 ?Discharge date: 06/12/2021 ?Consultations: None ?Admitted From: SNF ?Disposition: SNF ? ?Discharge Diagnoses:  ?Principal Problem: ?  AKI (acute kidney injury) (HCC) ?Active Problems: ?  H/O total hip arthroplasty ?  Thrombocytosis ?  PVD (peripheral vascular disease) (HCC) ?  Bronchiectasis (HCC) ?  Depression ?  HTN (hypertension) ?  Chronic obstructive pulmonary disease (HCC) ?  Chronic respiratory failure with hypoxia (HCC) ?  Leukocytosis ?  Mixed hyperlipidemia ?  Chronic kidney disease, stage 3a (HCC) ?  Normocytic anemia ? ? ?Hospital Course Summary: ?25 yom w/ significant hx of anxiety, depression, dementia, osteoarthritis of the hands, PUD, history of bleeding gastric ulcer, COPD, chronic cough, dyspnea, hypertension, PVD, multiple episodes of pneumonia  sent from facility due to dyspnea . He is confused at baseline and unable to provide meaningful history in ED.He is able to answer simple questions,stated he felt mildly dyspneic. ?ED course :Vitals stable,Urinalysis was normal.  Lactic acid x2 unremarkable.  Normal PT, INR PTT. WBC- 15.8, hemoglobin 8.9 g/dL platelets 562.  CMP with normal electrolytes.  Glucose 181, BUN 63 and creatinine 1.48 mg/dL (last week creatinine was 0.87 and 0.82 mg/dL). LFTs only showed a low albumin at 2.9 g/dL but are otherwise normal.CXR-chronic lung disease with RLL lobectomy and improved aggregation on the right upper lobe since previous radiograph on 05/22/2021. ?Hospital course: Patient was admitted for AKI, on IV fluids, needed Haldol for his restlessness and agitation 5/4.Next morning AKI resolved, leukocytosis resolved, hemoglobin 7.9 g chronically has been in 7.1- 8.6 g ?Vital signs remain stable and he is doing well on room air. Urine culture grew multiple species. ? ?AKI on Chronic kidney disease, stage 3a: Creatinine  improved to 0.9.  Encourage p.o./hydration. Now off IV fluids ?Bronchiectasis/COPD with Chronic hypoxic respiratory failure : Respiratory status stable whiel here although reported some dyspnea on presentation. CXR with chronic lung disease and other findings,see report.  Continue Dulera, DuoNeb, incentive spirometry. Stable on RA here at rest.  ?Leukocytosis/Thrombocytosis:Unclear etiology could be inconsideration/reactive resolved. ?Normocytic Chronic anemia: Baseline hemoglobin 7.1-8.6 g.  Monitor periodically upon discharge. Patient on asa/plavix and prophylactic lovenox (15 days) at SNF  ?Depression: Initially required Haldol prn for agitation. Last dose 5/4. Mood has been stable last 48 hrs and continue Seroquel,Zoloft. ?HTN/Mixed hyperlipidemia/PAD:BP stable continue aspirin, statin, Plavix and metoprolol.  Also on midodrine prn sbp <95. ?H/o Hip arthroplasty, chronic gait instability: Note to be on prophylactic Lovenox until May 12 per SNF MAR--PCP to clarify continued need and monitor labs/CBC. ?Pressure injury POA at multiple sites see below: ?    ?Pressure Injury 06/10/21 Foot Anterior;Left Unstageable - Full thickness tissue loss in which the base of the injury is covered by slough (yellow, tan, gray, green or brown) and/or eschar (tan, brown or black) in the wound bed. small area with  scab (Active)  ?06/10/21 1319  ?Location: Foot  ?Location Orientation: Anterior;Left  ?Staging: Unstageable - Full thickness tissue loss in which the base of the injury is covered by slough (yellow, tan, gray, green or brown) and/or eschar (tan, brown or black) in the wound bed.  ?Wound Description (Comments): small area with  scab  ?Present on Admission: Yes  ?Dressing Type Foam - Lift dressing to assess site every shift 06/10/21 2020  ?  ?    ?Pressure Injury Heel Posterior;Right Unstageable - Full thickness tissue loss in which the base of the  injury is covered by slough (yellow, tan, gray, green or brown) and/or  eschar (tan, brown or black) in the wound bed. previous stage 1 with large blist (Active)  ?   ?Location: Heel  ?Location Orientation: Posterior;Right  ?Staging: Unstageable - Full thickness tissue loss in which the base of the injury is covered by slough (yellow, tan, gray, green or brown) and/or eschar (tan, brown or black) in the wound bed.  ?Wound Description (Comments): previous stage 1 with large blister now  ?Present on Admission: Yes  ?Dressing Type Foam - Lift dressing to assess site every shift 06/10/21 2020  ?  ? ?Discharge Exam:  ? ?Vitals:  ? 06/11/21 1938 06/12/21 0455 06/12/21 0459 06/12/21 7425  ?BP: (!) 131/44  (!) 144/51   ?Pulse: 80  75   ?Resp: 20  20   ?Temp: 98.4 ?F (36.9 ?C)  98.8 ?F (37.1 ?C)   ?TempSrc: Oral  Oral   ?SpO2: 94%  95% 95%  ?Weight:  69.2 kg    ?Height:      ? ? ?General: Pt is alert, awake, not in acute distress ?Cardiovascular: RRR, S1/S2 +, no rubs, no gallops ?Respiratory: CTA bilaterally, no wheezing, no rhonchi ?Abdominal: Soft, NT, ND, bowel sounds + ?Extremities: no edema, no cyanosis ? ?Discharge Condition:Stable ?CODE STATUS: Full code ?Diet recommendation: Resume prior diet /tube feeds ?Recommendations for Outpatient Follow-up:  ?Follow up with PCP: at SNF. Please clarify Lovenox prophylactic dosing until 5/12 as outlinen in SNF MAR. Routine Oral care to be continued at SNF -wife concerned about consistency at SNF ?Follow up with consultants: Wound therapy, PT/OT at SNF ?Please obtain follow up labs including: CBC, BMP in 3-5 days . ? ?Home Health services upon discharge: N/A ?Equipment/Devices upon discharge: N/A ? ? ?Discharge Instructions: ? ?Discharge Instructions   ? ? Call MD for:  difficulty breathing, headache or visual disturbances   Complete by: As directed ?  ? Call MD for:  persistant dizziness or light-headedness   Complete by: As directed ?  ? Call MD for:  severe uncontrolled pain   Complete by: As directed ?  ? Call MD for:  temperature >100.4    Complete by: As directed ?  ? Diet - low sodium heart healthy   Complete by: As directed ?  ? Discharge wound care:   Complete by: As directed ?  ? Per wound care recommendations noted on discharge summary,. Wound care f/u at SNF  ? Increase activity slowly   Complete by: As directed ?  ? ?  ? ?Allergies as of 06/12/2021   ? ?   Reactions  ? Testosterone Rash  ? ?  ? ?  ?Medication List  ?  ? ?TAKE these medications   ? ?acetaminophen 500 MG tablet ?Commonly known as: TYLENOL ?Place 1,000 mg into feeding tube in the morning, at noon, and at bedtime. ?  ?albuterol 108 (90 Base) MCG/ACT inhaler ?Commonly known as: VENTOLIN HFA ?Inhale 2 puffs into the lungs every 4 (four) hours as needed for wheezing or shortness of breath. ?  ?albuterol (2.5 MG/3ML) 0.083% nebulizer solution ?Commonly known as: PROVENTIL ?Take 3 mLs (2.5 mg total) by nebulization every 6 (six) hours as needed for wheezing or shortness of breath. ?  ?aspirin EC 81 MG tablet ?Take 81 mg by mouth daily. Swallow whole. ?  ?atorvastatin 80 MG tablet ?Commonly known as: LIPITOR ?Take 1 tablet (80 mg total) by mouth at bedtime. ?  ?clopidogrel 75 MG tablet ?Commonly known  as: PLAVIX ?Take 1 tablet (75 mg total) by mouth daily. ?  ?diclofenac sodium 1 % Gel ?Commonly known as: VOLTAREN ?Apply 2 g topically 4 (four) times daily as needed (pain). ?  ?enoxaparin 40 MG/0.4ML injection ?Commonly known as: LOVENOX ?Inject 0.4 mLs (40 mg total) into the skin daily for 15 days. ?  ?feeding supplement (OSMOLITE 1.5 CAL) Liqd ?Place 1,000 mLs into feeding tube continuous. ?What changed:  ?how much to take ?when to take this ?  ?feeding supplement (PROSource TF) liquid ?Place 45 mLs into feeding tube 2 (two) times daily. ?What changed: Another medication with the same name was changed. Make sure you understand how and when to take each. ?  ?nutrition supplement (JUVEN) Pack ?Place 1 packet into feeding tube 2 (two) times daily between meals. ?What changed: Another  medication with the same name was changed. Make sure you understand how and when to take each. ?  ?ferrous sulfate 324 MG Tbec ?Take 324 mg by mouth every other day. ?  ?fluticasone-salmeterol 250-50 MCG/ACT Aepb ?Commo

## 2021-06-12 NOTE — Progress Notes (Signed)
AVS printed and given to EMS.  Discharge report called to [facility] at [phone].  Tobie Poet, LPN given report. ?

## 2021-06-12 NOTE — TOC Transition Note (Addendum)
Transition of Care (TOC) - CM/SW Discharge Note ? ? ?Patient Details  ?Name: William Kirk ?MRN: [id number] ?Date of Birth: [birth year] ? ?Transition of Care (TOC) CM/SW Contact:  ?Ross Ludwig, LCSW ?Phone Number: ?06/12/2021, 10:08 AM ? ? ?Clinical Narrative:    ? ?CSW was informed that patient can return back to [facility] today.  Patient to be d/c'ed today to [facility].  Patient and family agreeable to plans will transport via ems RN to call report to [phone].  Patient's wife is aware that patient will be discharging today. ? ? ? ?Final next level of care: [place] ?Barriers to Discharge: Barriers Resolved ? ? ?Patient Goals and CMS Choice ?Patient states their goals for this hospitalization and ongoing recovery are:: To return back to [facility]. ?CMS Medicare.gov Compare Post Acute Care list provided to:: Patient ?Choice offered to / list presented to : Patient ? ?Discharge Placement ?  ?Existing PASRR number confirmed : 06/11/21          ?Patient chooses bed at: [facility] ?Patient to be transferred to facility by: [facility] ?Name of family member notified: Wife [name] ?Patient and family notified of of transfer: 06/12/21 ? ?Discharge Plan and Services ?  ?Discharge Planning Services: CM Consult ?Post Acute Care Choice: [place]          ?  ?  ?  ?  ?  ?  ?  ?  ?  ?  ? ?Social Determinants of Health (SDOH) Interventions ?  ? ? ?Readmission Risk Interventions ? ?  02/25/2021  ?  2:25 PM 11/08/2018  ? 11:19 AM 10/23/2018  ?  8:44 AM  ?Readmission Risk Prevention Plan  ?Post Dischage Appt Complete    ?Medication Screening Complete    ?Transportation Screening Complete Complete Complete  ?PCP or Specialist Appt within 3-5 Days  Complete Complete  ?[place] or Home Care Consult  Complete Complete  ?Social Work Consult for [place] Planning/Counseling  Complete Complete  ?Palliative Care Screening  Not Applicable Not Applicable  ?Medication Review Human resources officer)  Complete Complete  ? ? ? ? ? ?

## 2021-06-14 DIAGNOSIS — M93971 Osteochondropathy, unspecified, right ankle and foot: Secondary | ICD-10-CM | POA: Diagnosis not present

## 2021-06-14 DIAGNOSIS — Z9181 History of falling: Secondary | ICD-10-CM | POA: Diagnosis not present

## 2021-06-14 DIAGNOSIS — R2681 Unsteadiness on feet: Secondary | ICD-10-CM | POA: Diagnosis not present

## 2021-06-14 DIAGNOSIS — J9621 Acute and chronic respiratory failure with hypoxia: Secondary | ICD-10-CM | POA: Diagnosis not present

## 2021-06-14 DIAGNOSIS — E46 Unspecified protein-calorie malnutrition: Secondary | ICD-10-CM | POA: Diagnosis not present

## 2021-06-14 DIAGNOSIS — S7291XD Unspecified fracture of right femur, subsequent encounter for closed fracture with routine healing: Secondary | ICD-10-CM | POA: Diagnosis not present

## 2021-06-14 DIAGNOSIS — I739 Peripheral vascular disease, unspecified: Secondary | ICD-10-CM | POA: Diagnosis not present

## 2021-06-14 DIAGNOSIS — R41841 Cognitive communication deficit: Secondary | ICD-10-CM | POA: Diagnosis not present

## 2021-06-15 DIAGNOSIS — Z87898 Personal history of other specified conditions: Secondary | ICD-10-CM | POA: Diagnosis not present

## 2021-06-15 DIAGNOSIS — J449 Chronic obstructive pulmonary disease, unspecified: Secondary | ICD-10-CM | POA: Diagnosis not present

## 2021-06-15 DIAGNOSIS — Z8669 Personal history of other diseases of the nervous system and sense organs: Secondary | ICD-10-CM | POA: Diagnosis not present

## 2021-06-15 DIAGNOSIS — Z8709 Personal history of other diseases of the respiratory system: Secondary | ICD-10-CM | POA: Diagnosis not present

## 2021-06-15 DIAGNOSIS — Z931 Gastrostomy status: Secondary | ICD-10-CM | POA: Diagnosis not present

## 2021-06-15 DIAGNOSIS — D649 Anemia, unspecified: Secondary | ICD-10-CM | POA: Diagnosis not present

## 2021-06-15 DIAGNOSIS — J479 Bronchiectasis, uncomplicated: Secondary | ICD-10-CM | POA: Diagnosis not present

## 2021-06-15 DIAGNOSIS — N179 Acute kidney failure, unspecified: Secondary | ICD-10-CM | POA: Diagnosis not present

## 2021-06-15 DIAGNOSIS — R1310 Dysphagia, unspecified: Secondary | ICD-10-CM | POA: Diagnosis not present

## 2021-06-15 LAB — CULTURE, BLOOD (ROUTINE X 2)
Culture: NO GROWTH
Culture: NO GROWTH
Special Requests: ADEQUATE
Special Requests: ADEQUATE

## 2021-06-16 ENCOUNTER — Encounter (HOSPITAL_COMMUNITY)
Admission: EM | Disposition: A | Discharge status: Skilled Nursing Facility | Payer: Self-pay | Source: Skilled Nursing Facility | Attending: Student

## 2021-06-16 ENCOUNTER — Inpatient Hospital Stay (HOSPITAL_COMMUNITY): Payer: No Typology Code available for payment source | Admitting: Anesthesiology

## 2021-06-16 ENCOUNTER — Emergency Department (HOSPITAL_COMMUNITY): Payer: No Typology Code available for payment source

## 2021-06-16 ENCOUNTER — Inpatient Hospital Stay (HOSPITAL_COMMUNITY): Payer: No Typology Code available for payment source

## 2021-06-16 ENCOUNTER — Other Ambulatory Visit: Payer: Self-pay

## 2021-06-16 ENCOUNTER — Inpatient Hospital Stay (HOSPITAL_COMMUNITY)
Admission: EM | Admit: 2021-06-16 | Discharge: 2021-06-21 | DRG: 480 | Disposition: A | Discharge status: Skilled Nursing Facility | Payer: No Typology Code available for payment source | Source: Skilled Nursing Facility | Attending: Student | Admitting: Student

## 2021-06-16 ENCOUNTER — Inpatient Hospital Stay (HOSPITAL_COMMUNITY): Payer: No Typology Code available for payment source | Admitting: Certified Registered"

## 2021-06-16 ENCOUNTER — Encounter (HOSPITAL_COMMUNITY): Payer: Self-pay | Admitting: Emergency Medicine

## 2021-06-16 DIAGNOSIS — I10 Essential (primary) hypertension: Secondary | ICD-10-CM | POA: Diagnosis present

## 2021-06-16 DIAGNOSIS — F0394 Unspecified dementia, unspecified severity, with anxiety: Secondary | ICD-10-CM | POA: Diagnosis present

## 2021-06-16 DIAGNOSIS — S72142A Displaced intertrochanteric fracture of left femur, initial encounter for closed fracture: Secondary | ICD-10-CM | POA: Diagnosis not present

## 2021-06-16 DIAGNOSIS — J189 Pneumonia, unspecified organism: Secondary | ICD-10-CM | POA: Diagnosis present

## 2021-06-16 DIAGNOSIS — S72145A Nondisplaced intertrochanteric fracture of left femur, initial encounter for closed fracture: Secondary | ICD-10-CM | POA: Diagnosis present

## 2021-06-16 DIAGNOSIS — D509 Iron deficiency anemia, unspecified: Secondary | ICD-10-CM | POA: Diagnosis present

## 2021-06-16 DIAGNOSIS — R918 Other nonspecific abnormal finding of lung field: Secondary | ICD-10-CM | POA: Diagnosis not present

## 2021-06-16 DIAGNOSIS — Y92129 Unspecified place in nursing home as the place of occurrence of the external cause: Secondary | ICD-10-CM | POA: Diagnosis not present

## 2021-06-16 DIAGNOSIS — Z7189 Other specified counseling: Secondary | ICD-10-CM

## 2021-06-16 DIAGNOSIS — Z7902 Long term (current) use of antithrombotics/antiplatelets: Secondary | ICD-10-CM

## 2021-06-16 DIAGNOSIS — J449 Chronic obstructive pulmonary disease, unspecified: Secondary | ICD-10-CM | POA: Diagnosis present

## 2021-06-16 DIAGNOSIS — R1310 Dysphagia, unspecified: Secondary | ICD-10-CM | POA: Diagnosis not present

## 2021-06-16 DIAGNOSIS — E785 Hyperlipidemia, unspecified: Secondary | ICD-10-CM | POA: Diagnosis present

## 2021-06-16 DIAGNOSIS — F418 Other specified anxiety disorders: Secondary | ICD-10-CM

## 2021-06-16 DIAGNOSIS — M21371 Foot drop, right foot: Secondary | ICD-10-CM | POA: Diagnosis present

## 2021-06-16 DIAGNOSIS — I959 Hypotension, unspecified: Secondary | ICD-10-CM | POA: Diagnosis not present

## 2021-06-16 DIAGNOSIS — S72002A Fracture of unspecified part of neck of left femur, initial encounter for closed fracture: Principal | ICD-10-CM

## 2021-06-16 DIAGNOSIS — R4189 Other symptoms and signs involving cognitive functions and awareness: Secondary | ICD-10-CM | POA: Diagnosis not present

## 2021-06-16 DIAGNOSIS — F32A Depression, unspecified: Secondary | ICD-10-CM | POA: Diagnosis present

## 2021-06-16 DIAGNOSIS — Z809 Family history of malignant neoplasm, unspecified: Secondary | ICD-10-CM | POA: Diagnosis not present

## 2021-06-16 DIAGNOSIS — Z96641 Presence of right artificial hip joint: Secondary | ICD-10-CM | POA: Diagnosis present

## 2021-06-16 DIAGNOSIS — F419 Anxiety disorder, unspecified: Secondary | ICD-10-CM | POA: Diagnosis present

## 2021-06-16 DIAGNOSIS — R41841 Cognitive communication deficit: Secondary | ICD-10-CM | POA: Diagnosis not present

## 2021-06-16 DIAGNOSIS — W19XXXA Unspecified fall, initial encounter: Secondary | ICD-10-CM | POA: Diagnosis not present

## 2021-06-16 DIAGNOSIS — M25552 Pain in left hip: Secondary | ICD-10-CM | POA: Diagnosis present

## 2021-06-16 DIAGNOSIS — R1313 Dysphagia, pharyngeal phase: Secondary | ICD-10-CM | POA: Diagnosis present

## 2021-06-16 DIAGNOSIS — W06XXXA Fall from bed, initial encounter: Secondary | ICD-10-CM | POA: Diagnosis present

## 2021-06-16 DIAGNOSIS — R262 Difficulty in walking, not elsewhere classified: Secondary | ICD-10-CM | POA: Diagnosis not present

## 2021-06-16 DIAGNOSIS — Z7401 Bed confinement status: Secondary | ICD-10-CM | POA: Diagnosis not present

## 2021-06-16 DIAGNOSIS — J438 Other emphysema: Secondary | ICD-10-CM | POA: Diagnosis not present

## 2021-06-16 DIAGNOSIS — I739 Peripheral vascular disease, unspecified: Secondary | ICD-10-CM | POA: Diagnosis present

## 2021-06-16 DIAGNOSIS — R4182 Altered mental status, unspecified: Secondary | ICD-10-CM | POA: Diagnosis not present

## 2021-06-16 DIAGNOSIS — Z79899 Other long term (current) drug therapy: Secondary | ICD-10-CM

## 2021-06-16 DIAGNOSIS — Z87891 Personal history of nicotine dependence: Secondary | ICD-10-CM

## 2021-06-16 DIAGNOSIS — Z9181 History of falling: Secondary | ICD-10-CM | POA: Diagnosis not present

## 2021-06-16 DIAGNOSIS — R2689 Other abnormalities of gait and mobility: Secondary | ICD-10-CM | POA: Diagnosis not present

## 2021-06-16 DIAGNOSIS — Z9861 Coronary angioplasty status: Secondary | ICD-10-CM | POA: Diagnosis not present

## 2021-06-16 DIAGNOSIS — D649 Anemia, unspecified: Secondary | ICD-10-CM | POA: Diagnosis present

## 2021-06-16 DIAGNOSIS — N1831 Chronic kidney disease, stage 3a: Secondary | ICD-10-CM | POA: Diagnosis present

## 2021-06-16 DIAGNOSIS — J439 Emphysema, unspecified: Secondary | ICD-10-CM | POA: Diagnosis present

## 2021-06-16 DIAGNOSIS — Z888 Allergy status to other drugs, medicaments and biological substances status: Secondary | ICD-10-CM

## 2021-06-16 DIAGNOSIS — E43 Unspecified severe protein-calorie malnutrition: Secondary | ICD-10-CM | POA: Diagnosis present

## 2021-06-16 DIAGNOSIS — R2681 Unsteadiness on feet: Secondary | ICD-10-CM | POA: Diagnosis not present

## 2021-06-16 DIAGNOSIS — L8990 Pressure ulcer of unspecified site, unspecified stage: Secondary | ICD-10-CM | POA: Diagnosis present

## 2021-06-16 DIAGNOSIS — N182 Chronic kidney disease, stage 2 (mild): Secondary | ICD-10-CM | POA: Diagnosis not present

## 2021-06-16 DIAGNOSIS — K254 Chronic or unspecified gastric ulcer with hemorrhage: Secondary | ICD-10-CM | POA: Diagnosis present

## 2021-06-16 DIAGNOSIS — R1312 Dysphagia, oropharyngeal phase: Secondary | ICD-10-CM | POA: Diagnosis not present

## 2021-06-16 DIAGNOSIS — R944 Abnormal results of kidney function studies: Secondary | ICD-10-CM | POA: Diagnosis present

## 2021-06-16 DIAGNOSIS — L89611 Pressure ulcer of right heel, stage 1: Secondary | ICD-10-CM | POA: Diagnosis not present

## 2021-06-16 DIAGNOSIS — M6281 Muscle weakness (generalized): Secondary | ICD-10-CM | POA: Diagnosis not present

## 2021-06-16 DIAGNOSIS — S72002D Fracture of unspecified part of neck of left femur, subsequent encounter for closed fracture with routine healing: Secondary | ICD-10-CM | POA: Diagnosis not present

## 2021-06-16 DIAGNOSIS — Z96649 Presence of unspecified artificial hip joint: Secondary | ICD-10-CM

## 2021-06-16 DIAGNOSIS — I129 Hypertensive chronic kidney disease with stage 1 through stage 4 chronic kidney disease, or unspecified chronic kidney disease: Secondary | ICD-10-CM | POA: Diagnosis present

## 2021-06-16 DIAGNOSIS — Z7982 Long term (current) use of aspirin: Secondary | ICD-10-CM

## 2021-06-16 DIAGNOSIS — S72009A Fracture of unspecified part of neck of unspecified femur, initial encounter for closed fracture: Secondary | ICD-10-CM | POA: Diagnosis present

## 2021-06-16 HISTORY — PX: INTRAMEDULLARY (IM) NAIL INTERTROCHANTERIC: SHX5875

## 2021-06-16 LAB — CBC
HCT: 30.1 % — ABNORMAL LOW (ref 39.0–52.0)
Hemoglobin: 9.1 g/dL — ABNORMAL LOW (ref 13.0–17.0)
MCH: 26.6 pg (ref 26.0–34.0)
MCHC: 30.2 g/dL (ref 30.0–36.0)
MCV: 88 fL (ref 80.0–100.0)
Platelets: 371 10*3/uL (ref 150–400)
RBC: 3.42 MIL/uL — ABNORMAL LOW (ref 4.22–5.81)
RDW: 16 % — ABNORMAL HIGH (ref 11.5–15.5)
WBC: 10.5 10*3/uL (ref 4.0–10.5)
nRBC: 0 % (ref 0.0–0.2)

## 2021-06-16 LAB — ABO/RH: ABO/RH(D): O POS

## 2021-06-16 LAB — BASIC METABOLIC PANEL
Anion gap: 7 (ref 5–15)
BUN: 28 mg/dL — ABNORMAL HIGH (ref 8–23)
CO2: 24 mmol/L (ref 22–32)
Calcium: 8.6 mg/dL — ABNORMAL LOW (ref 8.9–10.3)
Chloride: 106 mmol/L (ref 98–111)
Creatinine, Ser: 1.12 mg/dL (ref 0.61–1.24)
GFR, Estimated: >60 mL/min (ref >60)
Glucose, Bld: 92 mg/dL (ref 70–99)
Potassium: 4.2 mmol/L (ref 3.5–5.1)
Sodium: 137 mmol/L (ref 135–145)

## 2021-06-16 LAB — TYPE AND SCREEN
ABO/RH(D): O POS
Antibody Screen: NEGATIVE

## 2021-06-16 LAB — PROTIME-INR
INR: 1.1 (ref 0.8–1.2)
Prothrombin Time: 14.1 seconds (ref 11.4–15.2)

## 2021-06-16 LAB — PROCALCITONIN: Procalcitonin: 0.12 ng/mL

## 2021-06-16 SURGERY — FIXATION, FRACTURE, INTERTROCHANTERIC, WITH INTRAMEDULLARY ROD
Anesthesia: General | Laterality: Left

## 2021-06-16 MED ORDER — 0.9 % SODIUM CHLORIDE (POUR BTL) OPTIME
TOPICAL | Status: DC | PRN
  Administered 2021-06-16: 1000 mL

## 2021-06-16 MED ORDER — PROPOFOL 10 MG/ML IV BOLUS
INTRAVENOUS | Status: AC
  Filled 2021-06-16: qty 20

## 2021-06-16 MED ORDER — PANTOPRAZOLE 2 MG/ML SUSPENSION
40.0000 mg | Freq: Every day | ORAL | Status: DC
  Administered 2021-06-16 – 2021-06-21 (×6): 40 mg
  Filled 2021-06-16 (×7): qty 20

## 2021-06-16 MED ORDER — DEXAMETHASONE SODIUM PHOSPHATE 10 MG/ML IJ SOLN
INTRAMUSCULAR | Status: AC
  Filled 2021-06-16: qty 1

## 2021-06-16 MED ORDER — QUETIAPINE FUMARATE 25 MG PO TABS
25.0000 mg | ORAL_TABLET | Freq: Every day | ORAL | Status: DC
  Administered 2021-06-16 – 2021-06-21 (×6): 25 mg
  Filled 2021-06-16 (×7): qty 1

## 2021-06-16 MED ORDER — PHENYLEPHRINE HCL (PRESSORS) 10 MG/ML IV SOLN
INTRAVENOUS | Status: AC
  Filled 2021-06-16: qty 1

## 2021-06-16 MED ORDER — METHOCARBAMOL 1000 MG/10ML IJ SOLN
500.0000 mg | Freq: Four times a day (QID) | INTRAVENOUS | Status: DC | PRN
  Administered 2021-06-17: 500 mg via INTRAVENOUS
  Filled 2021-06-16: qty 500
  Filled 2021-06-16 (×2): qty 5

## 2021-06-16 MED ORDER — ROPIVACAINE HCL 5 MG/ML IJ SOLN
INTRAMUSCULAR | Status: DC | PRN
  Administered 2021-06-16: 30 mL via PERINEURAL

## 2021-06-16 MED ORDER — FENTANYL CITRATE (PF) 100 MCG/2ML IJ SOLN
INTRAMUSCULAR | Status: AC
  Filled 2021-06-16: qty 2

## 2021-06-16 MED ORDER — ARFORMOTEROL TARTRATE 15 MCG/2ML IN NEBU
15.0000 ug | INHALATION_SOLUTION | Freq: Two times a day (BID) | RESPIRATORY_TRACT | Status: DC
  Administered 2021-06-16 – 2021-06-21 (×11): 15 ug via RESPIRATORY_TRACT
  Filled 2021-06-16 (×11): qty 2

## 2021-06-16 MED ORDER — METHOCARBAMOL 500 MG PO TABS
500.0000 mg | ORAL_TABLET | Freq: Four times a day (QID) | ORAL | Status: DC | PRN
  Administered 2021-06-17 – 2021-06-20 (×2): 500 mg
  Filled 2021-06-16 (×2): qty 1

## 2021-06-16 MED ORDER — SODIUM CHLORIDE 0.9 % IV SOLN
500.0000 mg | INTRAVENOUS | Status: DC
  Administered 2021-06-16 – 2021-06-17 (×2): 500 mg via INTRAVENOUS
  Filled 2021-06-16 (×2): qty 5

## 2021-06-16 MED ORDER — CHLORHEXIDINE GLUCONATE 4 % EX LIQD: 60.0000 mL | Freq: Once | CUTANEOUS | Status: DC

## 2021-06-16 MED ORDER — ENOXAPARIN SODIUM 40 MG/0.4ML IJ SOSY
40.0000 mg | PREFILLED_SYRINGE | INTRAMUSCULAR | Status: DC
  Administered 2021-06-17: 40 mg via SUBCUTANEOUS
  Filled 2021-06-16: qty 0.4

## 2021-06-16 MED ORDER — ALBUTEROL SULFATE (2.5 MG/3ML) 0.083% IN NEBU: 2.5000 mg | INHALATION_SOLUTION | Freq: Four times a day (QID) | RESPIRATORY_TRACT | Status: DC | PRN

## 2021-06-16 MED ORDER — VANCOMYCIN HCL 1000 MG IV SOLR
INTRAVENOUS | Status: AC
  Filled 2021-06-16: qty 20

## 2021-06-16 MED ORDER — DEXAMETHASONE SODIUM PHOSPHATE 10 MG/ML IJ SOLN
INTRAMUSCULAR | Status: DC | PRN
  Administered 2021-06-16: 10 mg via INTRAVENOUS

## 2021-06-16 MED ORDER — CEFAZOLIN SODIUM-DEXTROSE 2-4 GM/100ML-% IV SOLN
2.0000 g | INTRAVENOUS | Status: AC
  Administered 2021-06-16: 2 g via INTRAVENOUS
  Filled 2021-06-16: qty 100

## 2021-06-16 MED ORDER — ROCURONIUM BROMIDE 10 MG/ML (PF) SYRINGE
PREFILLED_SYRINGE | INTRAVENOUS | Status: DC | PRN
  Administered 2021-06-16: 30 mg via INTRAVENOUS

## 2021-06-16 MED ORDER — SODIUM CHLORIDE 0.9 % IV SOLN
2.0000 g | INTRAVENOUS | Status: DC
  Administered 2021-06-16 – 2021-06-17 (×2): 2 g via INTRAVENOUS
  Filled 2021-06-16 (×2): qty 20

## 2021-06-16 MED ORDER — UMECLIDINIUM BROMIDE 62.5 MCG/ACT IN AEPB
1.0000 | INHALATION_SPRAY | Freq: Every day | RESPIRATORY_TRACT | Status: DC
  Filled 2021-06-16 (×2): qty 7

## 2021-06-16 MED ORDER — BUDESONIDE 0.25 MG/2ML IN SUSP
0.2500 mg | Freq: Two times a day (BID) | RESPIRATORY_TRACT | Status: DC
  Administered 2021-06-16 – 2021-06-21 (×11): 0.25 mg via RESPIRATORY_TRACT
  Filled 2021-06-16 (×11): qty 2

## 2021-06-16 MED ORDER — LIDOCAINE HCL (PF) 2 % IJ SOLN
INTRAMUSCULAR | Status: AC
  Filled 2021-06-16: qty 5

## 2021-06-16 MED ORDER — SENNOSIDES-DOCUSATE SODIUM 8.6-50 MG PO TABS: 1.0000 | ORAL_TABLET | Freq: Every evening | ORAL | Status: DC | PRN

## 2021-06-16 MED ORDER — ONDANSETRON HCL 4 MG/2ML IJ SOLN: 4.0000 mg | Freq: Four times a day (QID) | INTRAMUSCULAR | Status: DC | PRN

## 2021-06-16 MED ORDER — SERTRALINE HCL 100 MG PO TABS
200.0000 mg | ORAL_TABLET | Freq: Every day | ORAL | Status: DC
  Administered 2021-06-16 – 2021-06-21 (×6): 200 mg
  Filled 2021-06-16 (×8): qty 2

## 2021-06-16 MED ORDER — QUETIAPINE FUMARATE 25 MG PO TABS: 25.0000 mg | ORAL_TABLET | ORAL | Status: DC

## 2021-06-16 MED ORDER — VANCOMYCIN HCL 1000 MG IV SOLR
INTRAVENOUS | Status: DC | PRN
  Administered 2021-06-16: 1000 mg via TOPICAL

## 2021-06-16 MED ORDER — REVEFENACIN 175 MCG/3ML IN SOLN
175.0000 ug | Freq: Every day | RESPIRATORY_TRACT | Status: DC
  Administered 2021-06-18 – 2021-06-21 (×4): 175 ug via RESPIRATORY_TRACT
  Filled 2021-06-16 (×5): qty 3

## 2021-06-16 MED ORDER — METOPROLOL TARTRATE 25 MG PO TABS
12.5000 mg | ORAL_TABLET | Freq: Every day | ORAL | Status: DC
  Administered 2021-06-16 – 2021-06-21 (×6): 12.5 mg
  Filled 2021-06-16 (×6): qty 1

## 2021-06-16 MED ORDER — SUCCINYLCHOLINE CHLORIDE 200 MG/10ML IV SOSY
PREFILLED_SYRINGE | INTRAVENOUS | Status: DC | PRN
  Administered 2021-06-16: 100 mg via INTRAVENOUS

## 2021-06-16 MED ORDER — LIDOCAINE 2% (20 MG/ML) 5 ML SYRINGE
INTRAMUSCULAR | Status: DC | PRN
  Administered 2021-06-16: 60 mg via INTRAVENOUS

## 2021-06-16 MED ORDER — PHENYLEPHRINE 80 MCG/ML (10ML) SYRINGE FOR IV PUSH (FOR BLOOD PRESSURE SUPPORT)
PREFILLED_SYRINGE | INTRAVENOUS | Status: DC | PRN
  Administered 2021-06-16: 160 ug via INTRAVENOUS
  Administered 2021-06-16: 80 ug via INTRAVENOUS
  Administered 2021-06-16 (×3): 160 ug via INTRAVENOUS

## 2021-06-16 MED ORDER — POVIDONE-IODINE 10 % EX SWAB: 2.0000 "application " | Freq: Once | CUTANEOUS | Status: DC

## 2021-06-16 MED ORDER — QUETIAPINE FUMARATE 25 MG PO TABS
50.0000 mg | ORAL_TABLET | Freq: Every day | ORAL | Status: DC
  Administered 2021-06-16 – 2021-06-20 (×5): 50 mg
  Filled 2021-06-16: qty 1
  Filled 2021-06-16 (×5): qty 2

## 2021-06-16 MED ORDER — SUGAMMADEX SODIUM 200 MG/2ML IV SOLN
INTRAVENOUS | Status: DC | PRN
Start: 2021-06-16 — End: 2021-06-16
  Administered 2021-06-16: 200 mg via INTRAVENOUS

## 2021-06-16 MED ORDER — MOMETASONE FURO-FORMOTEROL FUM 200-5 MCG/ACT IN AERO
2.0000 | INHALATION_SPRAY | Freq: Two times a day (BID) | RESPIRATORY_TRACT | Status: DC
  Filled 2021-06-16 (×2): qty 8.8

## 2021-06-16 MED ORDER — GUAIFENESIN-DM 100-10 MG/5ML PO SYRP
5.0000 mL | ORAL_SOLUTION | Freq: Two times a day (BID) | ORAL | Status: DC | PRN
  Administered 2021-06-19: 5 mL
  Filled 2021-06-16: qty 10

## 2021-06-16 MED ORDER — MELATONIN 3 MG PO TABS
3.0000 mg | ORAL_TABLET | Freq: Every day | ORAL | Status: DC
  Administered 2021-06-16 – 2021-06-20 (×5): 3 mg
  Filled 2021-06-16 (×5): qty 1

## 2021-06-16 MED ORDER — FENTANYL CITRATE PF 50 MCG/ML IJ SOSY
12.5000 ug | PREFILLED_SYRINGE | INTRAMUSCULAR | Status: DC | PRN
  Administered 2021-06-16 – 2021-06-17 (×4): 25 ug via INTRAVENOUS
  Administered 2021-06-17 – 2021-06-20 (×5): 12.5 ug via INTRAVENOUS
  Filled 2021-06-16 (×9): qty 1

## 2021-06-16 MED ORDER — PHENYLEPHRINE HCL-NACL 20-0.9 MG/250ML-% IV SOLN
INTRAVENOUS | Status: DC | PRN
  Administered 2021-06-16: 50 ug/min via INTRAVENOUS

## 2021-06-16 MED ORDER — ACETAMINOPHEN 500 MG PO TABS: 1000.0000 mg | ORAL_TABLET | Freq: Once | ORAL | Status: DC

## 2021-06-16 MED ORDER — FENTANYL CITRATE (PF) 100 MCG/2ML IJ SOLN
INTRAMUSCULAR | Status: DC | PRN
  Administered 2021-06-16 (×2): 50 ug via INTRAVENOUS

## 2021-06-16 MED ORDER — ATORVASTATIN CALCIUM 40 MG PO TABS
80.0000 mg | ORAL_TABLET | Freq: Every day | ORAL | Status: DC
  Administered 2021-06-16 – 2021-06-20 (×5): 80 mg
  Filled 2021-06-16 (×5): qty 2

## 2021-06-16 MED ORDER — LACTATED RINGERS IV SOLN: INTRAVENOUS | Status: AC

## 2021-06-16 MED ORDER — ONDANSETRON HCL 4 MG/2ML IJ SOLN
INTRAMUSCULAR | Status: AC
  Filled 2021-06-16: qty 2

## 2021-06-16 MED ORDER — BISACODYL 10 MG RE SUPP: 10.0000 mg | Freq: Every day | RECTAL | Status: DC | PRN

## 2021-06-16 MED ORDER — PHENYLEPHRINE 80 MCG/ML (10ML) SYRINGE FOR IV PUSH (FOR BLOOD PRESSURE SUPPORT)
PREFILLED_SYRINGE | INTRAVENOUS | Status: AC
  Filled 2021-06-16: qty 10

## 2021-06-16 MED ORDER — ALBUTEROL SULFATE (2.5 MG/3ML) 0.083% IN NEBU: 2.5000 mg | INHALATION_SOLUTION | RESPIRATORY_TRACT | Status: DC | PRN

## 2021-06-16 MED ORDER — FENTANYL CITRATE PF 50 MCG/ML IJ SOSY: 25.0000 ug | PREFILLED_SYRINGE | INTRAMUSCULAR | Status: DC | PRN

## 2021-06-16 MED ORDER — PROPOFOL 10 MG/ML IV BOLUS
INTRAVENOUS | Status: DC | PRN
  Administered 2021-06-16: 50 mg via INTRAVENOUS
  Administered 2021-06-16: 20 mg via INTRAVENOUS

## 2021-06-16 MED ORDER — ONDANSETRON HCL 4 MG/2ML IJ SOLN
INTRAMUSCULAR | Status: DC | PRN
  Administered 2021-06-16: 4 mg via INTRAVENOUS

## 2021-06-16 SURGICAL SUPPLY — 56 items
ADH SKN CLS APL DERMABOND .7 (GAUZE/BANDAGES/DRESSINGS) ×1
APL PRP STRL LF DISP 70% ISPRP (MISCELLANEOUS) ×1
BAG COUNTER SPONGE SURGICOUNT (BAG) ×3 IMPLANT
BAG SPNG CNTER NS LX DISP (BAG) ×1
BIT DRILL INTERTAN LAG SCREW (BIT) ×1 IMPLANT
BIT DRILL LONG 4.0 (BIT) IMPLANT
CHLORAPREP W/TINT 26 (MISCELLANEOUS) ×3 IMPLANT
COVER MAYO STAND STRL (DRAPES) ×1 IMPLANT
COVER PERINEAL POST (MISCELLANEOUS) ×3 IMPLANT
COVER SURGICAL LIGHT HANDLE (MISCELLANEOUS) ×3 IMPLANT
DERMABOND ADVANCED (GAUZE/BANDAGES/DRESSINGS) ×1
DERMABOND ADVANCED .7 DNX12 (GAUZE/BANDAGES/DRESSINGS) IMPLANT
DRAPE C-ARM 42X120 X-RAY (DRAPES) ×1 IMPLANT
DRAPE C-ARMOR (DRAPES) ×3 IMPLANT
DRAPE STERI IOBAN 125X83 (DRAPES) ×3 IMPLANT
DRAPE U-SHAPE 47X51 STRL (DRAPES) ×6 IMPLANT
DRESSING MEPILEX FLEX 4X4 (GAUZE/BANDAGES/DRESSINGS) IMPLANT
DRILL BIT LONG 4.0 (BIT) ×2
DRSG MEPILEX FLEX 4X4 (GAUZE/BANDAGES/DRESSINGS)
DRSG TEGADERM 2-3/8X2-3/4 SM (GAUZE/BANDAGES/DRESSINGS) ×1 IMPLANT
DRSG TEGADERM 4X4.75 (GAUZE/BANDAGES/DRESSINGS) ×10 IMPLANT
ELECT REM PT RETURN 15FT ADLT (MISCELLANEOUS) IMPLANT
GAUZE SPONGE 4X4 12PLY STRL (GAUZE/BANDAGES/DRESSINGS) ×3 IMPLANT
GAUZE XEROFORM 1X8 LF (GAUZE/BANDAGES/DRESSINGS) ×1 IMPLANT
GLOVE BIO SURGEON STRL SZ7.5 (GLOVE) ×6 IMPLANT
GLOVE BIO SURGEON STRL SZ8 (GLOVE) ×3 IMPLANT
GLOVE BIOGEL PI IND STRL 7.5 (GLOVE) ×2 IMPLANT
GLOVE BIOGEL PI IND STRL 8 (GLOVE) ×2 IMPLANT
GLOVE BIOGEL PI INDICATOR 7.5 (GLOVE) ×1
GLOVE BIOGEL PI INDICATOR 8 (GLOVE) ×1
GLOVE SURG POLYISO LF SZ7.5 (GLOVE) ×3 IMPLANT
GOWN STRL REUS W/ TWL LRG LVL3 (GOWN DISPOSABLE) ×2 IMPLANT
GOWN STRL REUS W/ TWL XL LVL3 (GOWN DISPOSABLE) ×4 IMPLANT
GOWN STRL REUS W/TWL LRG LVL3 (GOWN DISPOSABLE) ×2
GOWN STRL REUS W/TWL XL LVL3 (GOWN DISPOSABLE) ×4
GUIDE PIN 3.2X343 (PIN) ×2
GUIDE PIN 3.2X343MM (PIN) ×4
HOLDER FOLEY CATH W/STRAP (MISCELLANEOUS) ×1 IMPLANT
KIT BASIN OR (CUSTOM PROCEDURE TRAY) ×3 IMPLANT
KIT TURNOVER KIT A (KITS) IMPLANT
MANIFOLD NEPTUNE II (INSTRUMENTS) ×3 IMPLANT
NAIL INTERTAN 10X18 130D 10S (Nail) ×1 IMPLANT
NS IRRIG 1000ML POUR BTL (IV SOLUTION) ×3 IMPLANT
PACK GENERAL/GYN (CUSTOM PROCEDURE TRAY) ×3 IMPLANT
PIN GUIDE 3.2X343MM (PIN) IMPLANT
SCREW LAG COMPR KIT 100/95 (Screw) ×1 IMPLANT
SCREW TRIGEN LOW PROF 5.0X40 (Screw) ×1 IMPLANT
STRIP CLOSURE SKIN 1/2X4 (GAUZE/BANDAGES/DRESSINGS) ×3 IMPLANT
SUT MNCRL AB 3-0 PS2 18 (SUTURE) ×3 IMPLANT
SUT VIC AB 0 CT1 36 (SUTURE) ×3 IMPLANT
SUT VIC AB 1 CT1 36 (SUTURE) ×3 IMPLANT
SUT VIC AB 2-0 CT2 27 (SUTURE) ×6 IMPLANT
TOWEL OR 17X26 10 PK STRL BLUE (TOWEL DISPOSABLE) ×3 IMPLANT
TOWEL OR NON WOVEN STRL DISP B (DISPOSABLE) ×3 IMPLANT
TRAY FOLEY MTR SLVR 16FR STAT (SET/KITS/TRAYS/PACK) ×1 IMPLANT
WATER STERILE IRR 1000ML POUR (IV SOLUTION) ×1 IMPLANT

## 2021-06-16 NOTE — ED Notes (Signed)
RN updated wife, Mary Sella  ?

## 2021-06-16 NOTE — Progress Notes (Signed)
Patients wife Lucilla Lame contacted via telephone.  Verbal consent given by wife for patient to have surgery.  Verified by 2 RN's ?

## 2021-06-16 NOTE — Progress Notes (Addendum)
?      ?                 PROGRESS NOTE ? ?      ?PATIENT DETAILS ?Name: William Kirk ?Age: 81 y.o. ?Sex: male ?Date of Birth: October 24, 1940 ?Admit Date: 06/16/2021 ?Admitting Physician Briscoe Deutscher, MD ?[id number], Phill Myron, PA-C ? ?Brief Summary: ?Patient is a 81 y.o.  male with history of COPD, HTN, PAD, depression/anxiety-recent ORIF for periprosthetic right femur fracture on 4/14-presented to the ED for from his SNF following a mechanical fall-and left hip pain-found to have a left hip fracture and subsequently admitted to the hospitalist service. ? ? ?Significant events: ?5/10>> admit to [facility]-left hip fracture following a mechanical fall at SNF. ? ?Significant studies: ?10/10>> x-ray left hip: Left femoral intertrochanteric fracture. ?10/10>> CXR: No acute findings. ?10/10>> CT head: No acute intracranial abnormality. ?10/10>> CT C-spine: No acute bony abnormality, 13 mm nodular area in the left upper lobe-infectious versus inflammatory. ? ?Significant microbiology data: ? ? ?Procedures: ? ? ?Consults: ?Orthopedics. ? ?Subjective: ?Lying comfortably in bed-denies any chest pain or shortness of breath. ? ?Objective: ?Vitals: ?Blood pressure 116/78, pulse 79, temperature (!) 97 ?F (36.1 ?C), temperature source Temporal, resp. rate 20, height 6' (1.829 m), weight 79.4 kg, SpO2 92 %.  ? ?Exam: ?Gen Exam:Alert awake-not in any distress ?HEENT:atraumatic, normocephalic ?Chest: B/L clear to auscultation anteriorly ?CVS:S1S2 regular ?Abdomen:soft non tender, non distended ?Extremities:no edema ?Neurology: Non focal ?Skin: no rash ? ?Pertinent Labs/Radiology: ? ?  Latest Ref Rng & Units 06/16/2021  ?  3:26 AM 06/11/2021  ?  4:56 AM 06/10/2021  ?  4:06 AM  ?CBC  ?WBC 4.0 - 10.5 K/uL 10.5   9.9   15.8    ?Hemoglobin 13.0 - 17.0 g/dL 9.1   7.9   8.9    ?Hematocrit 39.0 - 52.0 % 30.1   26.1   28.3    ?Platelets 150 - 400 K/uL 371   390   499    ?  ?Lab Results  ?Component Value Date  ? NA 137 06/16/2021  ? K 4.2 06/16/2021   ? CL 106 06/16/2021  ? CO2 24 06/16/2021  ?  ? ? ?Assessment/Plan: ?Left hip fracture: Following a mechanical fall-orthopedics planning ORIF today.  Long discussion with patient/spouse-he remained intubated last admission after his hip surgery-this was for a aspiration episode.  Spouse/patient aware-regarding inherent risk-including risk of death/inability to liberate off the ventilator-they want to proceed with surgery-and accepting all risks.  Will need to be cautious with IVF/narcotics/volume status postoperatively-encourage bronchodilators-use of incentive spirometry and flutter valve. ? ?COPD: Not in exacerbation-lungs completely clear-denies any shortness of breath-continue bronchodilators-encourage incentive spirometry/flutter valve.   ? ?Probable LUL PNA: Seen on CT and C-spine-does not appear to be overtly symptomatic-no cough/SOB-lying comfortably in bed-continue Rocephin/Zithromax for now-low threshold to discontinue over the next few days. ? ?History of PAD-s/p PCI 03/05/2021: Resume antiplatelets when okay with orthopedics. ? ?HTN: BP stable-continue metoprolol. ? ?Depression/anxiety: Appears to be stable-continue Seroquel/Zoloft. ? ?History of dysphagia: We will require SLP evaluation postoperatively.  At risk for aspiration. ? ?BMI: ?Estimated body mass index is 23.73 kg/m? as calculated from the following: ?  Height as of this encounter: 6' (1.829 m). ?  Weight as of this encounter: 79.4 kg.  ? ?Code status: ?  Code Status: Full Code  ? ?DVT Prophylaxis: ?SCDs Start: 06/16/21 7846 ?  ?Family Communication: Ilija Chezem [phone] updated over the phone on 5/10 ? ? ?Disposition  Plan: ?Status is: Inpatient ?Remains inpatient appropriate because: Left hip fracture-for ORIF later today. ?  ?Planned Discharge Destination:Skilled nursing facility ? ? ?Diet: ?Diet Order   ? ?       ?  Diet NPO time specified  Diet effective now       ?  ? ?  ?  ? ?  ?  ? ? ?Antimicrobial agents: ?Anti-infectives  (From admission, onward)  ? ? Start     Dose/Rate Route Frequency Ordered Stop  ? 06/16/21 0800  cefTRIAXone (ROCEPHIN) 2 g in sodium chloride 0.9 % 100 mL IVPB       ? 2 g ?200 mL/hr over 30 Minutes Intravenous Every 24 hours 06/16/21 0621 06/21/21 0759  ? 06/16/21 0800  azithromycin (ZITHROMAX) 500 mg in sodium chloride 0.9 % 250 mL IVPB       ? 500 mg ?250 mL/hr over 60 Minutes Intravenous Every 24 hours 06/16/21 0621 06/21/21 0759  ? ?  ? ? ? ?MEDICATIONS: ?Scheduled Meds: ? atorvastatin  80 mg Per Tube QHS  ? melatonin  3 mg Per Tube QHS  ? metoprolol tartrate  12.5 mg Per Tube Daily  ? mometasone-formoterol  2 puff Inhalation BID  ? pantoprazole sodium  40 mg Per Tube Daily  ? QUEtiapine  25 mg Per Tube Daily  ? And  ? QUEtiapine  50 mg Per Tube QHS  ? sertraline  200 mg Per Tube Daily  ? umeclidinium bromide  1 puff Inhalation Daily  ? ?Continuous Infusions: ? azithromycin    ? cefTRIAXone (ROCEPHIN)  IV 2 g (06/16/21 1610)  ? lactated ringers Stopped (06/16/21 9604)  ? methocarbamol (ROBAXIN) IV    ? ?PRN Meds:.albuterol, bisacodyl, fentaNYL (SUBLIMAZE) injection, guaiFENesin-dextromethorphan, methocarbamol **OR** methocarbamol (ROBAXIN) IV, ondansetron (ZOFRAN) IV, senna-docusate ? ? ?I have personally reviewed following labs and imaging studies ? ?LABORATORY DATA: ?CBC: ?Recent Labs  ?Lab 06/10/21 ?0406 06/11/21 ?0456 06/16/21 ?0326  ?WBC 15.8* 9.9 10.5  ?NEUTROABS 13.8*  --   --   ?HGB 8.9* 7.9* 9.1*  ?HCT 28.3* 26.1* 30.1*  ?MCV 88.4 90.0 88.0  ?PLT 499* 390 371  ? ? ?Basic Metabolic Panel: ?Recent Labs  ?Lab 06/10/21 ?0406 06/11/21 ?0456 06/16/21 ?0326  ?NA 144 144 137  ?K 4.6 3.9 4.2  ?CL 109 112* 106  ?CO2 26 23 24   ?GLUCOSE 181* 106* 92  ?BUN 63* 47* 28*  ?CREATININE 1.48* 0.95 1.12  ?CALCIUM 9.0 8.3* 8.6*  ? ? ?GFR: ?Estimated Creatinine Clearance: 59.7 mL/min (by C-G formula based on SCr of 1.12 mg/dL). ? ?Liver Function Tests: ?Recent Labs  ?Lab 06/10/21 ?0406 06/11/21 ?0456  ?AST 30 27  ?ALT 31  26  ?ALKPHOS 118 93  ?BILITOT 0.7 0.5  ?PROT 7.1 6.3*  ?ALBUMIN 2.9* 2.6*  ? ?No results for input(s): LIPASE, AMYLASE in the last 168 hours. ?No results for input(s): AMMONIA in the last 168 hours. ? ?Coagulation Profile: ?Recent Labs  ?Lab 06/10/21 ?0406 06/16/21 ?0326  ?INR 1.2 1.1  ? ? ?Cardiac Enzymes: ?No results for input(s): CKTOTAL, CKMB, CKMBINDEX, TROPONINI in the last 168 hours. ? ?BNP (last 3 results) ?No results for input(s): PROBNP in the last 8760 hours. ? ?Lipid Profile: ?No results for input(s): CHOL, HDL, LDLCALC, TRIG, CHOLHDL, LDLDIRECT in the last 72 hours. ? ?Thyroid Function Tests: ?No results for input(s): TSH, T4TOTAL, FREET4, T3FREE, THYROIDAB in the last 72 hours. ? ?Anemia Panel: ?No results for input(s): VITAMINB12, FOLATE, FERRITIN, TIBC, IRON, RETICCTPCT in the  last 72 hours. ? ?Urine analysis: ?   ?Component Value Date/Time  ? COLORURINE YELLOW 06/10/2021 0406  ? APPEARANCEUR CLEAR 06/10/2021 0406  ? LABSPEC 1.021 06/10/2021 0406  ? PHURINE 7.0 06/10/2021 0406  ? GLUCOSEU NEGATIVE 06/10/2021 0406  ? HGBUR NEGATIVE 06/10/2021 0406  ? BILIRUBINUR NEGATIVE 06/10/2021 0406  ? KETONESUR NEGATIVE 06/10/2021 0406  ? PROTEINUR NEGATIVE 06/10/2021 0406  ? NITRITE NEGATIVE 06/10/2021 0406  ? LEUKOCYTESUR NEGATIVE 06/10/2021 0406  ? ? ?Sepsis Labs: ?Lactic Acid, Venous ?   ?Component Value Date/Time  ? LATICACIDVEN 0.9 06/10/2021 0743  ? ? ?MICROBIOLOGY: ?Recent Results (from the past 240 hour(s))  ?Blood Culture (routine x 2)     Status: None  ? Collection Time: 06/10/21  4:06 AM  ? Specimen: BLOOD  ?Result Value Ref Range Status  ? Specimen Description   Final  ?  BLOOD RIGHT ANTECUBITAL ?Performed at [facility], [address], [place], [place] [address] ?  ? Special Requests   Final  ?  BOTTLES DRAWN AEROBIC AND ANAEROBIC Blood Culture adequate volume ?Performed at [facility], [address], [place], [place] [address] ?  ? Culture   Final  ?  NO  GROWTH 5 DAYS ?Performed at [facility], [address] [place], [place] [address] ?  ? Report Status 06/15/2021 FINAL  Final  ?Urine Culture     Status: Abnormal  ? Collection Time: 06/10/21  4:

## 2021-06-16 NOTE — H&P (Signed)
?History and Physical  ? ? ?William Kirk XBJ:[id number] DOB: 12-Jun-1940 DOA: 06/16/2021 ? ?PCP: Floreen Comber, PA-C  ? ?Patient coming from: SNF  ? ?Chief Complaint: Fall, left hip pain  ? ?HPI: William Kirk is a pleasant 81 y.o. male with medical history significant for COPD, dysphagia, hypertension, PAD, depression, anxiety, CKD stage II, and ORIF of periprosthetic right femur fracture on 05/21/2021, who presented to the emergency department from his SNF with left hip pain after fall.  Patient provides a limited history, said to have baseline confusion, and reports that he was at a friend's house and getting out of bed when he fell.  This was unwitnessed.  He complained of left hip pain and was transported to the ED.  He was not sure if he hit his head or not but denied headache. ? ?ED Course: Upon arrival to the ED, patient is found to be afebrile and saturating mid 90s on room air with systolic pressure in the 110s.  Plain radiographs of the left hip demonstrate intertrochanteric fracture.  Head CT was negative for acute intracranial abnormality.  CT cervical spine features advanced degenerative changes without acute bony abnormality, and is also notable for new nodular areas in the left upper lobe suggestive of inflammatory or infectious process.  Chemistry panel notable for elevated BUN to creatinine ratio and CBC with stable normocytic anemia.  Orthopedic surgery was consulted by the ED physician. ? ?Review of Systems:  ?ROS is limited by the patient's clinical condition. ? ?Past Medical History:  ?Diagnosis Date  ? Anxiety   ? Arthritis   ? "hands" (09/21/2016)  ? Bleeding stomach ulcer 2000s  ? COPD (chronic obstructive pulmonary disease) (HCC)   ? Cough   ? Depression   ? Dyspnea   ? Grade I diastolic dysfunction 06/10/2021  ? Hypertension   ? Pneumonia   ? "now and several times before this" (09/21/2016)  ? PVD (peripheral vascular disease) (HCC) 2014  ? prev PTCA on RLE  ? ? ?Past Surgical  History:  ?Procedure Laterality Date  ? ABDOMINAL AORTOGRAM W/LOWER EXTREMITY Bilateral 03/05/2021  ? Procedure: ABDOMINAL AORTOGRAM W/LOWER EXTREMITY;  Surgeon: Leonie Douglas, MD;  Location: [facility];  Service: Cardiovascular;  Laterality: Bilateral;  ? HIP ARTHROPLASTY Right 05/21/2021  ? Procedure: REVISION HIP REPLACEMENT;  Surgeon: [name], MD;  Location: [facility] OR;  Service: Orthopedics;  Laterality: Right;  ? IR ANGIOGRAM SELECTIVE EACH ADDITIONAL VESSEL  07/26/2018  ? IR ANGIOGRAM SELECTIVE EACH ADDITIONAL VESSEL  07/26/2018  ? IR ANGIOGRAM SELECTIVE EACH ADDITIONAL VESSEL  07/26/2018  ? IR EMBO ART  VEN HEMORR LYMPH EXTRAV  INC GUIDE ROADMAPPING  07/26/2018  ? IR GASTROSTOMY TUBE MOD SED  12/14/2018  ? IR GASTROSTOMY TUBE MOD SED  05/28/2021  ? IR US GUIDE VASC ACCESS RIGHT  07/26/2018  ? JOINT REPLACEMENT    ? KNEE ARTHROSCOPY Right X 1  ? PERIPHERAL VASCULAR BALLOON ANGIOPLASTY  03/05/2021  ? Procedure: PERIPHERAL VASCULAR BALLOON ANGIOPLASTY;  Surgeon: Leonie Douglas, MD;  Location: [facility];  Service: Cardiovascular;;  DCB Popliteal  ? PERIPHERAL VASCULAR INTERVENTION  03/05/2021  ? Procedure: PERIPHERAL VASCULAR INTERVENTION;  Surgeon: Leonie Douglas, MD;  Location: [facility];  Service: Cardiovascular;;  ? TOTAL HIP ARTHROPLASTY Right 2009  ? TRANSCAROTID ARTERY REVASCULARIZATION?  Right 02/24/2021  ? Procedure: RIGHT TRANSCAROTID ARTERY REVASCULARIZATION;  Surgeon: Leonie Douglas, MD;  Location: [facility] OR;  Service: Vascular;  Laterality: Right;  ?  ULTRASOUND GUIDANCE FOR VASCULAR ACCESS Left 02/24/2021  ? Procedure: ULTRASOUND GUIDANCE FOR VASCULAR ACCESS;  Surgeon: Leonie Douglas, MD;  Location: [facility] OR;  Service: Vascular;  Laterality: Left;  ? VIDEO ASSISTED THORACOSCOPY (VATS)/ LOBECTOMY Right 10/18/2018  ? Procedure: VIDEO ASSISTED THORACOSCOPY (VATS)/ right lower LOBECTOMY;  Surgeon: Loreli Slot, MD;  Location: [facility] OR;  Service: Thoracic;  Laterality:  Right;  ? VIDEO ASSISTED THORACOSCOPY (VATS)/DECORTICATION Right 11/09/2018  ? Procedure: REDO VIDEO ASSISTED THORACOSCOPY (VATS)/DECORTICATION/DRAIN EFFUSION;  Surgeon: Loreli Slot, MD;  Location: [facility] OR;  Service: Thoracic;  Laterality: Right;  ? VIDEO BRONCHOSCOPY N/A 07/27/2018  ? Procedure: VIDEO BRONCHOSCOPY WITHOUT FLUORO;  Surgeon: Leslye Peer, MD;  Location: [facility] OR;  Service: Cardiopulmonary;  Laterality: N/A;  ? VIDEO BRONCHOSCOPY N/A 11/09/2018  ? Procedure: VIDEO BRONCHOSCOPY;  Surgeon: Loreli Slot, MD;  Location: [facility] OR;  Service: Thoracic;  Laterality: N/A;  ? ? ?Social History:  ? reports that he quit smoking about 6 years ago. His smoking use included cigarettes. He has a 55.00 pack-year smoking history. He has never used smokeless tobacco. He reports that he does not drink alcohol and does not use drugs. ? ?Allergies  ?Allergen Reactions  ? Testosterone Rash  ? ? ?Family History  ?Problem Relation Age of Onset  ? Cancer Father 83  ?     Brain  ? Dementia Mother   ? ? ? ?Prior to Admission medications   ?Medication Sig Start Date End Date Taking? Authorizing Provider  ?acetaminophen (TYLENOL) 500 MG tablet Place 1,000 mg into feeding tube in the morning, at noon, and at bedtime. 10/28/19   [provider]  ?albuterol (PROVENTIL) (2.5 MG/3ML) 0.083% nebulizer solution Take 3 mLs (2.5 mg total) by nebulization every 6 (six) hours as needed for wheezing or shortness of breath. 05/07/21   Parrett, Virgel Bouquet, NP  ?albuterol (VENTOLIN HFA) 108 (90 Base) MCG/ACT inhaler Inhale 2 puffs into the lungs every 4 (four) hours as needed for wheezing or shortness of breath. 07/11/18   Hongalgi, Maximino Greenland, MD  ?aspirin EC 81 MG tablet Take 81 mg by mouth daily. Swallow whole.    [provider]  ?atorvastatin (LIPITOR) 80 MG tablet Take 1 tablet (80 mg total) by mouth at bedtime. 02/25/21   Lars Mage, PA-C  ?Cholecalciferol (VITAMIN D) 50 MCG (2000 UT) tablet Place 2,000 Units into  feeding tube at bedtime.    [provider]  ?clopidogrel (PLAVIX) 75 MG tablet Take 1 tablet (75 mg total) by mouth daily. 01/12/21   Leonie Douglas, MD  ?diclofenac sodium (VOLTAREN) 1 % GEL Apply 2 g topically 4 (four) times daily as needed (pain).     [provider]  ?enoxaparin (LOVENOX) 40 MG/0.4ML injection Inject 0.4 mLs (40 mg total) into the skin daily for 15 days. 06/03/21 06/18/21  Tyrone Nine, MD  ?ferrous sulfate 324 MG TBEC Take 324 mg by mouth every other day.    [provider]  ?fluticasone-salmeterol (WIXELA INHUB) 250-50 MCG/ACT AEPB Inhale 1 puff into the lungs in the morning and at bedtime. 05/07/21   Parrett, Virgel Bouquet, NP  ?guaiFENesin-dextromethorphan (ROBITUSSIN DM) 100-10 MG/5ML syrup Place 5 mLs into feeding tube every 12 (twelve) hours as needed for cough.    [provider]  ?lidocaine 4 % Place 1 patch onto the skin daily as needed (pain).     [provider]  ?loperamide (IMODIUM) 1 MG/5ML solution Take 2 mg by mouth every  6 (six) hours as needed for diarrhea or loose stools.    [provider]  ?melatonin 3 MG TABS tablet Take 1 tablet (3 mg total) by mouth at bedtime. 06/01/21   Lorin Glass, MD  ?methocarbamol (ROBAXIN) 500 MG tablet Take 1 tablet (500 mg total) by mouth 2 (two) times daily as needed for muscle spasms. ?Patient taking differently: Place 500 mg into feeding tube 2 (two) times daily as needed for muscle spasms. Stop date 06-16-21 04/16/20   Marcello Fennel, MD  ?methocarbamol (ROBAXIN) 500 MG tablet Place 250 mg into feeding tube daily. Hold for sedation    [provider]  ?methocarbamol (ROBAXIN) 500 MG tablet Place 500 mg into feeding tube at bedtime.    [provider]  ?metoprolol tartrate (LOPRESSOR) 25 MG tablet Place 12.5 mg into feeding tube daily.    [provider]  ?midodrine (PROAMATINE) 2.5 MG tablet Take 2.5 mg by mouth 2 (two) times daily as needed (SBP < 95).     [provider]  ?Multiple Vitamins-Minerals (MULTIVITAMIN WITH MINERALS) tablet Place 1 tablet into feeding tube daily.    [provider]  ?nutrition supplement, JUVEN, (JUVEN) PACK Place 1 packet in

## 2021-06-16 NOTE — Progress Notes (Signed)
Left intertroch fracture. Full consult note to follow. Plan for OR later today vs tomorrow pending OR availability and medical clearance. ?

## 2021-06-16 NOTE — Discharge Instructions (Signed)
Diet: As you were doing prior to hospitalization  ? ?Shower:  May shower but keep the wounds dry, use an occlusive plastic wrap, NO SOAKING IN TUB.  If the bandage gets wet, change with a clean dry gauze.  If you have a splint on, leave the splint in place and keep the splint dry with a plastic bag. ? ?Dressing:  You may change your dressing 3-5 days after surgery, unless you have a splint.  If the dressing remains clean and dry it can also be left on until follow up. If you change the dressing replace with clean gauze and tape or ace wrap. If you have a splint, then just leave the splint in place and we will change your bandages during your first follow-up appointment.  If water gets in the splint or the splint gets saturated please call the clinic and we can see you to change your splint. ? ?If you had hand or foot surgery, we will plan to remove your stitches in about 2 weeks in the office.  For all other surgeries, there are sticky tapes (steri-strips) on your wounds and all the stitches are absorbable.  Leave the steri-strips in place when changing your dressings, they will peel off with time, usually 2-3 weeks. ? ?Activity:  Increase activity slowly as tolerated, but follow the weight bearing instructions below.  The rules on driving is that you can not be taking narcotics while you drive, and you must feel in control of the vehicle.   ? ?Weight Bearing:   20% weight bearing right leg. PRAFO boot for R foot, weight bearing as tolerated left hip.   ? ?Blood clot prevention (DVT Prophylaxis): After surgery you are at an increased risk for a blood clot. you were prescribed a blood thinner, lovenox '40mg'$ , to be taken once daily for a total of 4 weeks from surgery to help reduce your risk of getting a blood clot. This will help prevent a blood clot. Signs of a pulmonary embolus (blood clot in the lungs) include sudden short of breath, feeling lightheaded or dizzy, chest pain with a deep breath, rapid pulse rapid  breathing. Signs of a blood clot in your arms or legs include new unexplained swelling and cramping, warm, red or darkened skin around the painful area. Please call the office or 911 right away if these signs or symptoms develop. ?To prevent constipation: you may use a stool softener such as - ? ?Colace (over the counter) 100 mg by mouth twice a day  ?Drink plenty of fluids (prune juice may be helpful) and high fiber foods ?Miralax (over the counter) for constipation as needed.   ? ?Itching:  If you experience itching with your medications, try taking only a single pain pill, or even half a pain pill at a time.  You may take up to 10 pain pills per day, and you can also use benadryl over the counter for itching or also to help with sleep.  ? ?Precautions:  If you experience chest pain or shortness of breath - call 911 immediately for transfer to the hospital emergency department!! ? ? ?Call office [phone]) for the following: ?Temperature greater than 101F ?Persistent nausea and vomiting ?Severe uncontrolled pain ?Redness, tenderness, or signs of infection (pain, swelling, redness, odor or green/yellow discharge around the site) ?Difficulty breathing, headache or visual disturbances ?Hives ?Persistent dizziness or light-headedness ?Extreme fatigue ?Any other questions or concerns you may have after discharge ? ?In an emergency, call 911 or go to  an Emergency Department at a nearby hospital ? ?Follow- Up Appointment:  Please call for an appointment to be seen approximately 2 week after surgery in [facility] with your surgeon Dr. Charlies Constable - [phone] ?Address: [address] [place], [place], [place] [address] ? ? ?  ?

## 2021-06-16 NOTE — Anesthesia Procedure Notes (Signed)
Anesthesia Regional Block: Peng block  ? ?Pre-Anesthetic Checklist: , timeout performed,  Correct Patient, Correct Site, Correct Laterality,  Correct Procedure, Correct Position, site marked,  Risks and benefits discussed,  Surgical consent,  Pre-op evaluation,  At surgeon's request and post-op pain management ? ?Laterality: Left ? ?Prep: chloraprep     ?  ?Needles:  ?Injection technique: Single-shot ? ?Needle Type: Echogenic Stimulator Needle   ? ? ?Needle Length: 10cm  ?Needle Gauge: 20  ? ? ? ?Additional Needles: ? ? ?Procedures:,,,, ultrasound used (permanent image in chart),,    ?Narrative:  ?Start time: 06/16/2021 8:49 AM ?End time: 06/16/2021 8:53 AM ?Injection made incrementally with aspirations every 5 mL. ? ?Performed by: Personally  ?Anesthesiologist: [name], MD ? ?Additional Notes: ?Standard monitors applied. Skin prepped. Good needle visualization with ultrasound. Injection made in 5cc increments with no resistance to injection. Patient tolerated the procedure well. ? ? ? ? ? ?

## 2021-06-16 NOTE — Brief Op Note (Signed)
DATE OF SURGERY:  06/16/2021 ? ?TIME: 5:47 PM ? ?PATIENT NAME:  William Kirk ? ?AGE: 81 y.o. ? ?PRE-OPERATIVE DIAGNOSIS:  LEFT STABLE INTERTROCHENTERIC FRACTURE ? ?POST-OPERATIVE DIAGNOSIS:  SAME ? ?PROCEDURE:  INTRAMEDULLARY (IM) NAIL INTERTROCHANTRIC ? ?SURGEON:  Wanisha Shiroma A Fantasia Jinkins ? ?ASSISTANT: Izola Price, RNFA was present and scrubbed throughout the case, critical for assistance with exposure, retraction, instrumentation, and closure. ? ?OPERATIVE IMPLANTS:  ?[employer] Intertan Nail 10 x 180 mm , 100/95 lag compression screw, 88m distal interlock ? ?Implant Name Type Inv. Item Serial No. Manufacturer Lot No. LRB No. Used Action  ?NAIL INTERTAN 10X18 130D 10S - [id number] NAIL INTERTAN 10X18 130D 10S  [employer] [id number] 1 Implanted  ?SCREW LAG COMPR KIT 100/95 - [id number] SCREW LAG COMPR KIT 100/95  [facility] [id number] 1 Implanted  ?SCREW TRIGEN LOW PROF 5.0X40 - [id number] SCREW TRIGEN LOW PROF 5.0X40  [employer] [id number] 1 Implanted  ? ?ESTIMATED BLOOD LOSS: 100cc ? ?PREOPERATIVE INDICATIONS:  William DOWELLis a 81y.o. year old who fell and suffered an LEFT INTERTROCHENTERIC FRACTURE. He was brought into the ER and then admitted and optimized and then elected for surgical intervention.   ? ?The risks benefits and alternatives were discussed with the patient including but not limited to the risks of nonoperative treatment, versus surgical intervention including infection, bleeding, nerve injury, malunion, nonunion, hardware prominence, hardware failure, need for hardware removal, blood clots, cardiopulmonary complications, morbidity, mortality, among others, and they were willing to proceed.   ? ?OPERATIVE PROCEDURE: ? ?The patient was brought to the operating room and placed in the supine position. Anesthesia was administered. He was placed on the fracture table.  Closed reduction was performed under C-arm  guidance.  Time out was then performed after sterile prep and drape. He received preoperative antibiotics. ? ?Small incision proximal to the greater trochanter was made and carried down through skin and subcutaneous tissue.  Threaded guidewire was directed at the tip of the greater trochanter and advanced into the proximal metaphysis.  Positioning was confirmed with fluoroscopy.  I then used an entry reamer to enter the medullary canal.  I then passed a 10 x 180 mm InterTAN down the center of the canal attached to the targeting arm.  I then used the targeting arm to make a percutaneous incision and directed a threaded guidewire up into the head/neck segment.  I confirmed adequate tip apex distance and measured the length.  I decided to place a 100 mm screw.  I then drilled the path for the compression screw and placed an antirotation bar.  I then placed the lag screw and then placed the compression screw and compressed approximately 5 mm.  The proximal portion of the nail was statically locked. ?  ?I used the targeting arm to place a distal interlocking screw.  The targeting arm was removed.  Final fluoroscopic imaging was obtained.   ? ?The wounds were irrigated copiously, and vancomycin powder was placed in the wounds.  The gluteal fascia was closed with 0 Vicryl, and skin was closed with 2-0 Vicryl and 3-0 Monocryl.  Sterile dressing was applied with Dermabond, 4 x 4 gauze, and Tegaderm.  The patient was awakened and returned to PACU in stable and satisfactory condition. There were no complications and the patient tolerated the procedure well. ? ? ?Post op recs: ?WB: WBAT LLE ?Abx: ancef x23 hours post op ?Imaging: PACU xrays ?  Dressing: keep intact until follow up, change PRN if soiled or saturated. ?DVT prophylaxis: lovenox starting POD1 x4 weeks ?Follow up: 2 weeks after surgery for a wound check with Dr. Zachery Dakins at [facility].  ?Address: [address] [place], [place], [place] [address]   ?Office Phone: [phone] ? ?William Constable, MD ?Orthopaedic Surgery  ?

## 2021-06-16 NOTE — Progress Notes (Signed)
Orthopedic Tech Progress Note ?Patient Details:  ?William Kirk ?1940-07-18 ?[id number] ? ?Patient ID: KAELYN NAUTA, male   DOB: 1940-03-31, 81 y.o.   MRN: [id number] ? ?[name] ?06/16/2021, 6:31 PM ?AFO to be delivered in morning 5/11 ?

## 2021-06-16 NOTE — TOC CAGE-AID Note (Signed)
Transition of Care (TOC) - CAGE-AID Screening ? ? ?Patient Details  ?Name: William Kirk ?MRN: [id number] ?Date of Birth: 05/24/1940 ? ?Transition of Care (TOC) CM/SW Contact:    ?Army Melia, RN ?Phone [phone] ?06/16/2021, 6:06 AM ? ? ?Clinical Narrative: ? ?Fall this morning at skilled nursing facility resulting in left hip fx. Confused at baseline, unable to participate in screening. ? ?CAGE-AID Screening: ?Substance Abuse Screening unable to be completed due to: : Patient unable to participate ? ?  ?  ?  ?  ?  ? ?  ? ?  ? ? ? ? ? ? ?

## 2021-06-16 NOTE — Anesthesia Pain Management Evaluation Note (Signed)
?Anesthesia Pain Consult Note ? ?Patient: William Kirk, 81 y.o., male ? ?Consult Requested by: Jonetta Osgood, MD ? ?Reason for Consult: left hip fracture ? ?Level of Consciousness: alert ? ?Pain: severe with activity ? ?Last Vitals:  ?Vitals:  ? 06/16/21 0630 06/16/21 0645  ?BP: (!) 115/52 116/78  ?Pulse: 81 80  ?Resp: (!) 22 20  ?Temp:    ?SpO2: 93% 92%  ? ? ?Plan: Peripheral nerve block for pain control ? ?Risks of wet tap, epidural hematoma and spinal cord injury explained to:  ? ?Consent:Risks of procedure as well as the alternatives and risks of each were explained to the (patient/caregiver).  Consent for procedure obtained. ? ? ? ?Allergies  ?Allergen Reactions  ?? Testosterone Rash  ? ? ?Physical exam: ?PULM normal  ?CARDIO Heart regular rate and rhythm  ?OTHER   ? ?I have reviewed the patient's medications listed below. ?? atorvastatin  80 mg Per Tube QHS  ?? melatonin  3 mg Per Tube QHS  ?? metoprolol tartrate  12.5 mg Per Tube Daily  ?? mometasone-formoterol  2 puff Inhalation BID  ?? pantoprazole sodium  40 mg Per Tube Daily  ?? QUEtiapine  25 mg Per Tube Daily  ? And  ?? QUEtiapine  50 mg Per Tube QHS  ?? sertraline  200 mg Per Tube Daily  ?? umeclidinium bromide  1 puff Inhalation Daily  ? ?? azithromycin    ?? cefTRIAXone (ROCEPHIN)  IV 2 g (06/16/21 3875)  ?? lactated ringers Stopped (06/16/21 6433)  ?? methocarbamol (ROBAXIN) IV    ? ?albuterol, bisacodyl, fentaNYL (SUBLIMAZE) injection, guaiFENesin-dextromethorphan, methocarbamol **OR** methocarbamol (ROBAXIN) IV, ondansetron (ZOFRAN) IV, senna-docusate ? ?Past Medical History:  ?Diagnosis Date  ?? Anxiety   ?? Arthritis   ? "hands" (09/21/2016)  ?? Bleeding stomach ulcer 2000s  ?? COPD (chronic obstructive pulmonary disease) ([place])   ?? Cough   ?? Depression   ?? Dyspnea   ?? Grade I diastolic dysfunction 03/18/5186  ?? Hypertension   ?? Pneumonia   ? "now and several times before this" (09/21/2016)  ?? PVD (peripheral vascular disease) ([place])  2014  ? prev PTCA on RLE  ? ?Past Surgical History:  ?Procedure Laterality Date  ?? ABDOMINAL AORTOGRAM W/LOWER EXTREMITY Bilateral 03/05/2021  ? Procedure: ABDOMINAL AORTOGRAM W/LOWER EXTREMITY;  Surgeon: Cherre Robins, MD;  Location: [place] [facility];  Service: Cardiovascular;  Laterality: Bilateral;  ?? HIP ARTHROPLASTY Right 05/21/2021  ? Procedure: REVISION HIP REPLACEMENT;  Surgeon: Willaim Sheng, MD;  Location: [place];  Service: Orthopedics;  Laterality: Right;  ?? IR ANGIOGRAM SELECTIVE EACH ADDITIONAL VESSEL  07/26/2018  ?? IR ANGIOGRAM SELECTIVE EACH ADDITIONAL VESSEL  07/26/2018  ?? IR ANGIOGRAM SELECTIVE EACH ADDITIONAL VESSEL  07/26/2018  ?? IR EMBO ART  VEN HEMORR LYMPH EXTRAV  INC GUIDE ROADMAPPING  07/26/2018  ?? IR GASTROSTOMY TUBE MOD SED  12/14/2018  ?? IR GASTROSTOMY TUBE MOD SED  05/28/2021  ?? IR US GUIDE VASC ACCESS RIGHT  07/26/2018  ?? JOINT REPLACEMENT    ?? KNEE ARTHROSCOPY Right X 1  ?? PERIPHERAL VASCULAR BALLOON ANGIOPLASTY  03/05/2021  ? Procedure: PERIPHERAL VASCULAR BALLOON ANGIOPLASTY;  Surgeon: Cherre Robins, MD;  Location: [place] [facility];  Service: Cardiovascular;;  DCB Popliteal  ?? PERIPHERAL VASCULAR INTERVENTION  03/05/2021  ? Procedure: PERIPHERAL VASCULAR INTERVENTION;  Surgeon: Cherre Robins, MD;  Location: [place] [facility];  Service: Cardiovascular;;  ?? TOTAL HIP ARTHROPLASTY Right 2009  ?? TRANSCAROTID ARTERY  REVASCULARIZATION?? Right 02/24/2021  ? Procedure: RIGHT TRANSCAROTID ARTERY REVASCULARIZATION;  Surgeon: Cherre Robins, MD;  Location: [facility] OR;  Service: Vascular;  Laterality: Right;  ?? ULTRASOUND GUIDANCE FOR VASCULAR ACCESS Left 02/24/2021  ? Procedure: ULTRASOUND GUIDANCE FOR VASCULAR ACCESS;  Surgeon: Cherre Robins, MD;  Location: [facility] OR;  Service: Vascular;  Laterality: Left;  ?? VIDEO ASSISTED THORACOSCOPY (VATS)/ LOBECTOMY Right 10/18/2018  ? Procedure: VIDEO ASSISTED THORACOSCOPY (VATS)/ right lower LOBECTOMY;  Surgeon: Melrose Nakayama,  MD;  Location: [place];  Service: Thoracic;  Laterality: Right;  ?? VIDEO ASSISTED THORACOSCOPY (VATS)/DECORTICATION Right 11/09/2018  ? Procedure: REDO VIDEO ASSISTED THORACOSCOPY (VATS)/DECORTICATION/DRAIN EFFUSION;  Surgeon: Melrose Nakayama, MD;  Location: [place];  Service: Thoracic;  Laterality: Right;  ?? VIDEO BRONCHOSCOPY N/A 07/27/2018  ? Procedure: VIDEO BRONCHOSCOPY WITHOUT FLUORO;  Surgeon: Collene Gobble, MD;  Location: [place];  Service: Cardiopulmonary;  Laterality: N/A;  ?? VIDEO BRONCHOSCOPY N/A 11/09/2018  ? Procedure: VIDEO BRONCHOSCOPY;  Surgeon: Melrose Nakayama, MD;  Location: [place];  Service: Thoracic;  Laterality: N/A;  ? ? reports that he quit smoking about 6 years ago. His smoking use included cigarettes. He has a 55.00 pack-year smoking history. He has never used smokeless tobacco. He reports that he does not drink alcohol and does not use drugs.  ? ? ?William Kirk ?06/16/2021 ? ? ? ? ?

## 2021-06-16 NOTE — Transfer of Care (Signed)
Immediate Anesthesia Transfer of Care Note ? ?Patient: William Kirk ? ?Procedure(s) Performed: Procedure(s): ?INTRAMEDULLARY (IM) NAIL INTERTROCHANTRIC (Left) ? ?Patient Location: PACU ? ?Anesthesia Type:General ? ?Level of Consciousness: Alert, Awake, Oriented ? ?Airway & Oxygen Therapy: Patient Spontanous Breathing ? ?Post-op Assessment: Report given to RN ? ?Post vital signs: Reviewed and stable ? ?Last Vitals:  ?Vitals:  ? 06/16/21 1400 06/16/21 1516  ?BP: 115/61 (!) 121/55  ?Pulse: 77 76  ?Resp: 19 17  ?Temp:  36.9 ?C  ?SpO2: 91% 92%  ? ? ?Complications: No apparent anesthesia complications ? ?

## 2021-06-16 NOTE — Progress Notes (Signed)
Orthopedic Tech Progress Note ?Patient Details:  ?William Kirk ?[birth year] ?[id number] ? ?Patient ID: William Kirk, male   DOB: 09/19/1940, 81 y.o.   MRN: [id number] ?Level 2 trauma not needed. ?[name] ?06/16/2021, 3:26 AM ? ?

## 2021-06-16 NOTE — H&P (View-Only) (Signed)
Reason for Consult:Left hip fx ?Referring Physician: Oren Binet ?Time called: 0730 ?Time at bedside: 0949 ? ? ?William Kirk is an 81 y.o. male.  ?HPI: William Kirk was helping move some furniture, lost his balance, and fell. He had immediate pain in his left hip and could not get up. He was brought to the ED where x-rays showed a left hip fx and orthopedic surgery was consulted. He lives at home with his wife and generally doesn't use any assistive devices to ambulate. ? ?Past Medical History:  ?Diagnosis Date  ? Anxiety   ? Arthritis   ? "hands" (09/21/2016)  ? Bleeding stomach ulcer 2000s  ? COPD (chronic obstructive pulmonary disease) ([place])   ? Cough   ? Depression   ? Dyspnea   ? Grade I diastolic dysfunction 03/11/1939  ? Hypertension   ? Pneumonia   ? "now and several times before this" (09/21/2016)  ? PVD (peripheral vascular disease) ([place]) 2014  ? prev PTCA on RLE  ? ? ?Past Surgical History:  ?Procedure Laterality Date  ? ABDOMINAL AORTOGRAM W/LOWER EXTREMITY Bilateral 03/05/2021  ? Procedure: ABDOMINAL AORTOGRAM W/LOWER EXTREMITY;  Surgeon: Cherre Robins, MD;  Location: [place] [facility];  Service: Cardiovascular;  Laterality: Bilateral;  ? HIP ARTHROPLASTY Right 05/21/2021  ? Procedure: REVISION HIP REPLACEMENT;  Surgeon: Willaim Sheng, MD;  Location: [place];  Service: Orthopedics;  Laterality: Right;  ? IR ANGIOGRAM SELECTIVE EACH ADDITIONAL VESSEL  07/26/2018  ? IR ANGIOGRAM SELECTIVE EACH ADDITIONAL VESSEL  07/26/2018  ? IR ANGIOGRAM SELECTIVE EACH ADDITIONAL VESSEL  07/26/2018  ? IR EMBO ART  VEN HEMORR LYMPH EXTRAV  INC GUIDE ROADMAPPING  07/26/2018  ? IR GASTROSTOMY TUBE MOD SED  12/14/2018  ? IR GASTROSTOMY TUBE MOD SED  05/28/2021  ? IR US GUIDE VASC ACCESS RIGHT  07/26/2018  ? JOINT REPLACEMENT    ? KNEE ARTHROSCOPY Right X 1  ? PERIPHERAL VASCULAR BALLOON ANGIOPLASTY  03/05/2021  ? Procedure: PERIPHERAL VASCULAR BALLOON ANGIOPLASTY;  Surgeon: Cherre Robins, MD;  Location: [place] [facility];   Service: Cardiovascular;;  DCB Popliteal  ? PERIPHERAL VASCULAR INTERVENTION  03/05/2021  ? Procedure: PERIPHERAL VASCULAR INTERVENTION;  Surgeon: Cherre Robins, MD;  Location: [place] [facility];  Service: Cardiovascular;;  ? TOTAL HIP ARTHROPLASTY Right 2009  ? TRANSCAROTID ARTERY REVASCULARIZATION?  Right 02/24/2021  ? Procedure: RIGHT TRANSCAROTID ARTERY REVASCULARIZATION;  Surgeon: Cherre Robins, MD;  Location: [facility] OR;  Service: Vascular;  Laterality: Right;  ? ULTRASOUND GUIDANCE FOR VASCULAR ACCESS Left 02/24/2021  ? Procedure: ULTRASOUND GUIDANCE FOR VASCULAR ACCESS;  Surgeon: Cherre Robins, MD;  Location: [place];  Service: Vascular;  Laterality: Left;  ? VIDEO ASSISTED THORACOSCOPY (VATS)/ LOBECTOMY Right 10/18/2018  ? Procedure: VIDEO ASSISTED THORACOSCOPY (VATS)/ right lower LOBECTOMY;  Surgeon: Melrose Nakayama, MD;  Location: [place];  Service: Thoracic;  Laterality: Right;  ? VIDEO ASSISTED THORACOSCOPY (VATS)/DECORTICATION Right 11/09/2018  ? Procedure: REDO VIDEO ASSISTED THORACOSCOPY (VATS)/DECORTICATION/DRAIN EFFUSION;  Surgeon: Melrose Nakayama, MD;  Location: [place];  Service: Thoracic;  Laterality: Right;  ? VIDEO BRONCHOSCOPY N/A 07/27/2018  ? Procedure: VIDEO BRONCHOSCOPY WITHOUT FLUORO;  Surgeon: Collene Gobble, MD;  Location: [place];  Service: Cardiopulmonary;  Laterality: N/A;  ? VIDEO BRONCHOSCOPY N/A 11/09/2018  ? Procedure: VIDEO BRONCHOSCOPY;  Surgeon: Melrose Nakayama, MD;  Location: [place];  Service: Thoracic;  Laterality: N/A;  ? ? ?Family History  ?Problem Relation Age of Onset  ? Cancer Father  56  ?     Brain  ? Dementia Mother   ? ? ?Social History:  reports that he quit smoking about 6 years ago. His smoking use included cigarettes. He has a 55.00 pack-year smoking history. He has never used smokeless tobacco. He reports that he does not drink alcohol and does not use drugs. ? ?Allergies:  ?Allergies  ?Allergen Reactions  ? Testosterone Rash  ? ? ?Medications: I have  reviewed the patient's current medications. ? ?Results for orders placed or performed during the hospital encounter of 06/16/21 (from the past 48 hour(s))  ?CBC     Status: Abnormal  ? Collection Time: 06/16/21  3:26 AM  ?Result Value Ref Range  ? WBC 10.5 4.0 - 10.5 K/uL  ? RBC 3.42 (L) 4.22 - 5.81 MIL/uL  ? Hemoglobin 9.1 (L) 13.0 - 17.0 g/dL  ? HCT 30.1 (L) 39.0 - 52.0 %  ? MCV 88.0 80.0 - 100.0 fL  ? MCH 26.6 26.0 - 34.0 pg  ? MCHC 30.2 30.0 - 36.0 g/dL  ? RDW 16.0 (H) 11.5 - 15.5 %  ? Platelets 371 150 - 400 K/uL  ? nRBC 0.0 0.0 - 0.2 %  ?  Comment: Performed at [facility], [place] [address] [place], [place] [address]  ?Basic metabolic panel     Status: Abnormal  ? Collection Time: 06/16/21  3:26 AM  ?Result Value Ref Range  ? Sodium 137 135 - 145 mmol/L  ? Potassium 4.2 3.5 - 5.1 mmol/L  ? Chloride 106 98 - 111 mmol/L  ? CO2 24 22 - 32 mmol/L  ? Glucose, Bld 92 70 - 99 mg/dL  ?  Comment: Glucose reference range applies only to samples taken after fasting for at least 8 hours.  ? BUN 28 (H) 8 - 23 mg/dL  ? Creatinine, Ser 1.12 0.61 - 1.24 mg/dL  ? Calcium 8.6 (L) 8.9 - 10.3 mg/dL  ? GFR, Estimated >60 >60 mL/min  ?  Comment: (NOTE) ?Calculated using the CKD-EPI Creatinine Equation (2021) ?  ? Anion gap 7 5 - 15  ?  Comment: Performed at [facility], [place] [address] [place], [place] [address]  ?Protime-INR     Status: None  ? Collection Time: 06/16/21  3:26 AM  ?Result Value Ref Range  ? Prothrombin Time 14.1 11.4 - 15.2 seconds  ? INR 1.1 0.8 - 1.2  ?  Comment: (NOTE) ?INR goal varies based on device and disease states. ?Performed at [facility], [place] [address] [place], [place] ?[address] ?  ?Procalcitonin - Baseline     Status: None  ? Collection Time: 06/16/21  3:26 AM  ?Result Value Ref Range  ? Procalcitonin 0.12 ng/mL  ?  Comment:        ?Interpretation: ?PCT (Procalcitonin) <= 0.5 ng/mL: ?Systemic infection (sepsis) is not likely. ?Local bacterial infection is possible. ?(NOTE) ?       Sepsis PCT Algorithm           Lower Respiratory Tract ?                                     Infection PCT Algorithm ?   ----------------------------     ---------------------------- ?        PCT < 0.25 ng/mL                PCT <  0.10 ng/mL ? ?        Strongly encourage             Strongly discourage ?  discontinuation of antibiotics    initiation of antibiotics ?   ----------------------------     ----------------------------- ?      PCT 0.25 - 0.50 ng/mL            PCT 0.10 - 0.25 ng/mL ?              OR ?      >80% decrease in PCT            Discourage initiation of ?                                           antibiotics ?     Encourage discontinuation ?          of antibiotics ?   ----------------------------     ----------------------------- ?        PCT >= 0.50 ng/mL              PCT 0.26 - 0.50 ng/mL ?              AND ?       <80% decrease in PCT             Encourage initiation of ?                                            antibiotics ?      Encourage continuation ?          of antibiotics ?   ----------------------------     ----------------------------- ?       PCT >= 0.50 ng/mL                  PCT > 0.50 ng/mL ?              AND ?        increase in PCT                  Strongly encourage ?                                     initiation of antibiotics ?   Strongly encourage escalation ?          of antibiotics ?                                    ----------------------------- ?                                          PCT <= 0.25 ng/mL ?                                                OR ?                                       >  80% decrease in PCT ? ?                                    Discontinue / Do not initiate ?                                            antibiotics ? ?Performed at [facility], [place] [address], [place], [place] ?[address] ?  ?Type and screen [place]     Status: None  ? Collection Time: 06/16/21  3:31 AM  ?Result Value Ref Range  ? ABO/RH(D) O POS   ?  Antibody Screen NEG   ? Sample Expiration    ?  06/19/2021,2359 ?Performed at [facility], [place] [address] [place], [place] [address] ?  ? ? ?CT HEAD WO CONTRAST (5MM) ? ?Result Date: 06/16/2021 ?C

## 2021-06-16 NOTE — ED Triage Notes (Signed)
Pt BIC [facility] from [place], pt had an unwitnessed fall. Per pt he was getting out of bed, denies LOC, unsure if he hit his head. C/o left hip pain. Pt confused, unclear baseline. ?

## 2021-06-16 NOTE — Progress Notes (Signed)
SLP Cancellation Note ? ?Patient Details ?Name: William Kirk ?MRN: [id number] ?DOB: 01-26-1941 ? ? ?Cancelled treatment:       Reason Eval/Treat Not Completed: Patient at procedure or test/unavailable. Surgery today. Will f/u tomorrow ? ? ?Dyamond Tolosa, Katherene Ponto ?06/16/2021, 9:53 AM ?

## 2021-06-16 NOTE — Anesthesia Postprocedure Evaluation (Signed)
Anesthesia Post Note ? ?Patient: William Kirk ? ?Procedure(s) Performed: INTRAMEDULLARY (IM) NAIL INTERTROCHANTRIC (Left) ? ?  ? ?Patient location during evaluation: PACU ?Anesthesia Type: General ?Level of consciousness: awake and alert ?Pain management: pain level controlled ?Vital Signs Assessment: post-procedure vital signs reviewed and stable ?Respiratory status: spontaneous breathing, nonlabored ventilation, respiratory function stable and patient connected to nasal cannula oxygen ?Cardiovascular status: blood pressure returned to baseline and stable ?Postop Assessment: no apparent nausea or vomiting ?Anesthetic complications: no ? ? ?No notable events documented. ? ?Last Vitals:  ?Vitals:  ? 06/16/21 1830 06/16/21 1845  ?BP: (!) 102/46 (!) 118/42  ?Pulse: 89 83  ?Resp: (!) 23 (!) 25  ?Temp:  36.7 ?C  ?SpO2: 93% 95%  ?  ?Last Pain:  ?Vitals:  ? 06/16/21 1845  ?TempSrc:   ?PainSc: 0-No pain  ? ? ?  ?  ?  ?  ?  ?  ? ?Ashleigh Luckow,W. EDMOND ? ? ? ? ?

## 2021-06-16 NOTE — Anesthesia Procedure Notes (Signed)
Procedure Name: Intubation ?Date/Time: 06/16/2021 4:37 PM ?Performed by: Gerald Leitz, CRNA ?Pre-anesthesia Checklist: Patient identified, Patient being monitored, Timeout performed, Emergency Drugs available and Suction available ?Patient Re-evaluated:Patient Re-evaluated prior to induction ?Oxygen Delivery Method: Circle system utilized ?Preoxygenation: Pre-oxygenation with 100% oxygen ?Induction Type: IV induction ?Ventilation: Mask ventilation without difficulty ?Laryngoscope Size: Mac and 3 ?Grade View: Grade I ?Tube type: Oral ?Tube size: 7.5 mm ?Number of attempts: 1 ?Airway Equipment and Method: Stylet ?Placement Confirmation: ETT inserted through vocal cords under direct vision, positive ETCO2 and breath sounds checked- equal and bilateral ?Secured at: 22 cm ?Tube secured with: Tape ?Dental Injury: Teeth and Oropharynx as per pre-operative assessment  ? ? ? ? ?

## 2021-06-16 NOTE — Anesthesia Preprocedure Evaluation (Addendum)
Anesthesia Evaluation  ?Patient identified by MRN, date of birth, ID band ?Patient awake ? ? ? ?Reviewed: ?Allergy & Precautions, H&P , NPO status , Patient's Chart, lab work & pertinent test results ? ?Airway ?Mallampati: II ? ?TM Distance: >3 FB ?Neck ROM: Full ? ? ? Dental ?no notable dental hx. ?(+) Teeth Intact, Dental Advisory Given ?  ?Pulmonary ?COPD,  COPD inhaler, former smoker,  ?  ?Pulmonary exam normal ?breath sounds clear to auscultation ? ? ? ? ? ? Cardiovascular ?hypertension, Pt. on medications and Pt. on home beta blockers ?+ Peripheral Vascular Disease  ? ?Rhythm:Regular Rate:Normal ? ? ?  ?Neuro/Psych ?Anxiety Depression negative neurological ROS ?   ? GI/Hepatic ?Neg liver ROS, PUD,   ?Endo/Other  ?negative endocrine ROS ? Renal/GU ?negative Renal ROS  ?negative genitourinary ?  ?Musculoskeletal ? ?(+) Arthritis , Osteoarthritis,   ? Abdominal ?  ?Peds ? Hematology ? ?(+) Blood dyscrasia, anemia ,   ?Anesthesia Other Findings ? ? Reproductive/Obstetrics ?negative OB ROS ? ?  ? ? ? ? ? ? ? ? ? ? ? ? ? ?  ?  ? ? ? ? ? ? ? ?Anesthesia Physical ?Anesthesia Plan ? ?ASA: 3 ? ?Anesthesia Plan: General  ? ?Post-op Pain Management: Tylenol PO (pre-op)*  ? ?Induction: Intravenous ? ?PONV Risk Score and Plan: 3 and Ondansetron, Dexamethasone and Treatment may vary due to age or medical condition ? ?Airway Management Planned: Oral ETT ? ?Additional Equipment:  ? ?Intra-op Plan:  ? ?Post-operative Plan: Extubation in OR ? ?Informed Consent: I have reviewed the patients History and Physical, chart, labs and discussed the procedure including the risks, benefits and alternatives for the proposed anesthesia with the patient or authorized representative who has indicated his/her understanding and acceptance.  ? ? ? ?Dental advisory given and Consent reviewed with POA ? ?Plan Discussed with: CRNA ? ?Anesthesia Plan Comments:   ? ? ? ? ? ?Anesthesia Quick Evaluation ? ?

## 2021-06-16 NOTE — Progress Notes (Signed)
Trauma Response Nurse Documentation ? ? ?William Kirk is a 81 y.o. male arriving to [facility] ED via EMS ? ?On clopidogrel 75 mg daily and lovenox, appears last dose was 5/3 per SNF MAR. Trauma was activated as a Level 2 by ED charge RN based on the following trauma criteria GCS 10-14 associated with trauma or AVPU < A. Trauma team at the bedside on patient arrival. Patient cleared for CT by Dr. Stark Jock EDP. Patient to CT with team. GCS 14 - EMS unable to report baseline. Per chart review, confused at baseline. ? ?History  ? Past Medical History:  ?Diagnosis Date  ? Anxiety   ? Arthritis   ? "hands" (09/21/2016)  ? Bleeding stomach ulcer 2000s  ? COPD (chronic obstructive pulmonary disease) ([place])   ? Cough   ? Depression   ? Dyspnea   ? Grade I diastolic dysfunction 03/18/5282  ? Hypertension   ? Pneumonia   ? "now and several times before this" (09/21/2016)  ? PVD (peripheral vascular disease) ([place]) 2014  ? prev PTCA on RLE  ?  ? Past Surgical History:  ?Procedure Laterality Date  ? ABDOMINAL AORTOGRAM W/LOWER EXTREMITY Bilateral 03/05/2021  ? Procedure: ABDOMINAL AORTOGRAM W/LOWER EXTREMITY;  Surgeon: Cherre Robins, MD;  Location: [place] [facility];  Service: Cardiovascular;  Laterality: Bilateral;  ? HIP ARTHROPLASTY Right 05/21/2021  ? Procedure: REVISION HIP REPLACEMENT;  Surgeon: Willaim Sheng, MD;  Location: [place];  Service: Orthopedics;  Laterality: Right;  ? IR ANGIOGRAM SELECTIVE EACH ADDITIONAL VESSEL  07/26/2018  ? IR ANGIOGRAM SELECTIVE EACH ADDITIONAL VESSEL  07/26/2018  ? IR ANGIOGRAM SELECTIVE EACH ADDITIONAL VESSEL  07/26/2018  ? IR EMBO ART  VEN HEMORR LYMPH EXTRAV  INC GUIDE ROADMAPPING  07/26/2018  ? IR GASTROSTOMY TUBE MOD SED  12/14/2018  ? IR GASTROSTOMY TUBE MOD SED  05/28/2021  ? IR US GUIDE VASC ACCESS RIGHT  07/26/2018  ? JOINT REPLACEMENT    ? KNEE ARTHROSCOPY Right X 1  ? PERIPHERAL VASCULAR BALLOON ANGIOPLASTY  03/05/2021  ? Procedure: PERIPHERAL VASCULAR BALLOON ANGIOPLASTY;  Surgeon:  Cherre Robins, MD;  Location: [place] [facility];  Service: Cardiovascular;;  DCB Popliteal  ? PERIPHERAL VASCULAR INTERVENTION  03/05/2021  ? Procedure: PERIPHERAL VASCULAR INTERVENTION;  Surgeon: Cherre Robins, MD;  Location: [place] [facility];  Service: Cardiovascular;;  ? TOTAL HIP ARTHROPLASTY Right 2009  ? TRANSCAROTID ARTERY REVASCULARIZATION?  Right 02/24/2021  ? Procedure: RIGHT TRANSCAROTID ARTERY REVASCULARIZATION;  Surgeon: Cherre Robins, MD;  Location: [facility];  Service: Vascular;  Laterality: Right;  ? ULTRASOUND GUIDANCE FOR VASCULAR ACCESS Left 02/24/2021  ? Procedure: ULTRASOUND GUIDANCE FOR VASCULAR ACCESS;  Surgeon: Cherre Robins, MD;  Location: [place];  Service: Vascular;  Laterality: Left;  ? VIDEO ASSISTED THORACOSCOPY (VATS)/ LOBECTOMY Right 10/18/2018  ? Procedure: VIDEO ASSISTED THORACOSCOPY (VATS)/ right lower LOBECTOMY;  Surgeon: Melrose Nakayama, MD;  Location: [place];  Service: Thoracic;  Laterality: Right;  ? VIDEO ASSISTED THORACOSCOPY (VATS)/DECORTICATION Right 11/09/2018  ? Procedure: REDO VIDEO ASSISTED THORACOSCOPY (VATS)/DECORTICATION/DRAIN EFFUSION;  Surgeon: Melrose Nakayama, MD;  Location: [place];  Service: Thoracic;  Laterality: Right;  ? VIDEO BRONCHOSCOPY N/A 07/27/2018  ? Procedure: VIDEO BRONCHOSCOPY WITHOUT FLUORO;  Surgeon: Collene Gobble, MD;  Location: [place];  Service: Cardiopulmonary;  Laterality: N/A;  ? VIDEO BRONCHOSCOPY N/A 11/09/2018  ? Procedure: VIDEO BRONCHOSCOPY;  Surgeon: Melrose Nakayama, MD;  Location: [place];  Service: Thoracic;  Laterality:  N/A;  ?  ? ? ? ?Initial Focused Assessment (If applicable, or please see trauma documentation): ?Alert/confused male presents via EMS from [facility] after an unwitnessed fall ?Airway clear/unobstructed, no obvious uncontrolled hemorrhage ?Skin tear left arm and left fifth toe ?C/o pain to left hip ?GCS 14, PERRLA 46m ? ?CT's Completed:   ?CT Head and CT C-Spine  ? ?Interventions:  ?CTs as  above ?Chest and pelvis XRAY ?IV start and trauma lab draw ? ?Plan for disposition:  ?Admission to floor  ? ?Consults completed:  ?Orthopaedic Surgeon at [phone] ?Hospitalist  ? ?Event Summary: ?Presents via EMS after an unwitnessed fall, states he was getting out of bed. EMS reports head injury, no obvious head trauma noted. No EMS c-collar in place. Left hip fx on portable films, pending admit for ortho eval.  ? ?MTP Summary (If applicable): NA ? ?Bedside handoff with ED RN CAnnamary Carolin   ? ?EBylas ?Trauma Response RN ? ?Please call TRN at [phone] further assistance. ?  ?

## 2021-06-16 NOTE — Consult Note (Signed)
Reason for Consult:Left hip fx ?Referring Physician: Oren Binet ?Time called: 0730 ?Time at bedside: 0949 ? ? ?William Kirk is an 81 y.o. male.  ?HPI: William Kirk was helping move some furniture, lost his balance, and fell. He had immediate pain in his left hip and could not get up. He was brought to the ED where x-rays showed a left hip fx and orthopedic surgery was consulted. He lives at home with his wife and generally doesn't use any assistive devices to ambulate. ? ?Past Medical History:  ?Diagnosis Date  ? Anxiety   ? Arthritis   ? "hands" (09/21/2016)  ? Bleeding stomach ulcer 2000s  ? COPD (chronic obstructive pulmonary disease) ([place])   ? Cough   ? Depression   ? Dyspnea   ? Grade I diastolic dysfunction 03/16/5168  ? Hypertension   ? Pneumonia   ? "now and several times before this" (09/21/2016)  ? PVD (peripheral vascular disease) ([place]) 2014  ? prev PTCA on RLE  ? ? ?Past Surgical History:  ?Procedure Laterality Date  ? ABDOMINAL AORTOGRAM W/LOWER EXTREMITY Bilateral 03/05/2021  ? Procedure: ABDOMINAL AORTOGRAM W/LOWER EXTREMITY;  Surgeon: Cherre Robins, MD;  Location: [place] [facility];  Service: Cardiovascular;  Laterality: Bilateral;  ? HIP ARTHROPLASTY Right 05/21/2021  ? Procedure: REVISION HIP REPLACEMENT;  Surgeon: Willaim Sheng, MD;  Location: [place];  Service: Orthopedics;  Laterality: Right;  ? IR ANGIOGRAM SELECTIVE EACH ADDITIONAL VESSEL  07/26/2018  ? IR ANGIOGRAM SELECTIVE EACH ADDITIONAL VESSEL  07/26/2018  ? IR ANGIOGRAM SELECTIVE EACH ADDITIONAL VESSEL  07/26/2018  ? IR EMBO ART  VEN HEMORR LYMPH EXTRAV  INC GUIDE ROADMAPPING  07/26/2018  ? IR GASTROSTOMY TUBE MOD SED  12/14/2018  ? IR GASTROSTOMY TUBE MOD SED  05/28/2021  ? IR US GUIDE VASC ACCESS RIGHT  07/26/2018  ? JOINT REPLACEMENT    ? KNEE ARTHROSCOPY Right X 1  ? PERIPHERAL VASCULAR BALLOON ANGIOPLASTY  03/05/2021  ? Procedure: PERIPHERAL VASCULAR BALLOON ANGIOPLASTY;  Surgeon: Cherre Robins, MD;  Location: [place] [facility];   Service: Cardiovascular;;  DCB Popliteal  ? PERIPHERAL VASCULAR INTERVENTION  03/05/2021  ? Procedure: PERIPHERAL VASCULAR INTERVENTION;  Surgeon: Cherre Robins, MD;  Location: [place] [facility];  Service: Cardiovascular;;  ? TOTAL HIP ARTHROPLASTY Right 2009  ? TRANSCAROTID ARTERY REVASCULARIZATION?  Right 02/24/2021  ? Procedure: RIGHT TRANSCAROTID ARTERY REVASCULARIZATION;  Surgeon: Cherre Robins, MD;  Location: [facility] OR;  Service: Vascular;  Laterality: Right;  ? ULTRASOUND GUIDANCE FOR VASCULAR ACCESS Left 02/24/2021  ? Procedure: ULTRASOUND GUIDANCE FOR VASCULAR ACCESS;  Surgeon: Cherre Robins, MD;  Location: [place];  Service: Vascular;  Laterality: Left;  ? VIDEO ASSISTED THORACOSCOPY (VATS)/ LOBECTOMY Right 10/18/2018  ? Procedure: VIDEO ASSISTED THORACOSCOPY (VATS)/ right lower LOBECTOMY;  Surgeon: Melrose Nakayama, MD;  Location: [place];  Service: Thoracic;  Laterality: Right;  ? VIDEO ASSISTED THORACOSCOPY (VATS)/DECORTICATION Right 11/09/2018  ? Procedure: REDO VIDEO ASSISTED THORACOSCOPY (VATS)/DECORTICATION/DRAIN EFFUSION;  Surgeon: Melrose Nakayama, MD;  Location: [place];  Service: Thoracic;  Laterality: Right;  ? VIDEO BRONCHOSCOPY N/A 07/27/2018  ? Procedure: VIDEO BRONCHOSCOPY WITHOUT FLUORO;  Surgeon: Collene Gobble, MD;  Location: [place];  Service: Cardiopulmonary;  Laterality: N/A;  ? VIDEO BRONCHOSCOPY N/A 11/09/2018  ? Procedure: VIDEO BRONCHOSCOPY;  Surgeon: Melrose Nakayama, MD;  Location: [place];  Service: Thoracic;  Laterality: N/A;  ? ? ?Family History  ?Problem Relation Age of Onset  ? Cancer Father  56  ?     Brain  ? Dementia Mother   ? ? ?Social History:  reports that he quit smoking about 6 years ago. His smoking use included cigarettes. He has a 55.00 pack-year smoking history. He has never used smokeless tobacco. He reports that he does not drink alcohol and does not use drugs. ? ?Allergies:  ?Allergies  ?Allergen Reactions  ? Testosterone Rash  ? ? ?Medications: I have  reviewed the patient's current medications. ? ?Results for orders placed or performed during the hospital encounter of 06/16/21 (from the past 48 hour(s))  ?CBC     Status: Abnormal  ? Collection Time: 06/16/21  3:26 AM  ?Result Value Ref Range  ? WBC 10.5 4.0 - 10.5 K/uL  ? RBC 3.42 (L) 4.22 - 5.81 MIL/uL  ? Hemoglobin 9.1 (L) 13.0 - 17.0 g/dL  ? HCT 30.1 (L) 39.0 - 52.0 %  ? MCV 88.0 80.0 - 100.0 fL  ? MCH 26.6 26.0 - 34.0 pg  ? MCHC 30.2 30.0 - 36.0 g/dL  ? RDW 16.0 (H) 11.5 - 15.5 %  ? Platelets 371 150 - 400 K/uL  ? nRBC 0.0 0.0 - 0.2 %  ?  Comment: Performed at [facility], [place] [address] [place], [place] [address]  ?Basic metabolic panel     Status: Abnormal  ? Collection Time: 06/16/21  3:26 AM  ?Result Value Ref Range  ? Sodium 137 135 - 145 mmol/L  ? Potassium 4.2 3.5 - 5.1 mmol/L  ? Chloride 106 98 - 111 mmol/L  ? CO2 24 22 - 32 mmol/L  ? Glucose, Bld 92 70 - 99 mg/dL  ?  Comment: Glucose reference range applies only to samples taken after fasting for at least 8 hours.  ? BUN 28 (H) 8 - 23 mg/dL  ? Creatinine, Ser 1.12 0.61 - 1.24 mg/dL  ? Calcium 8.6 (L) 8.9 - 10.3 mg/dL  ? GFR, Estimated >60 >60 mL/min  ?  Comment: (NOTE) ?Calculated using the CKD-EPI Creatinine Equation (2021) ?  ? Anion gap 7 5 - 15  ?  Comment: Performed at [facility], [place] [address] [place], [place] [address]  ?Protime-INR     Status: None  ? Collection Time: 06/16/21  3:26 AM  ?Result Value Ref Range  ? Prothrombin Time 14.1 11.4 - 15.2 seconds  ? INR 1.1 0.8 - 1.2  ?  Comment: (NOTE) ?INR goal varies based on device and disease states. ?Performed at [facility], [place] [address] [place], [place] ?[address] ?  ?Procalcitonin - Baseline     Status: None  ? Collection Time: 06/16/21  3:26 AM  ?Result Value Ref Range  ? Procalcitonin 0.12 ng/mL  ?  Comment:        ?Interpretation: ?PCT (Procalcitonin) <= 0.5 ng/mL: ?Systemic infection (sepsis) is not likely. ?Local bacterial infection is possible. ?(NOTE) ?       Sepsis PCT Algorithm           Lower Respiratory Tract ?                                     Infection PCT Algorithm ?   ----------------------------     ---------------------------- ?        PCT < 0.25 ng/mL                PCT <  0.10 ng/mL ? ?        Strongly encourage             Strongly discourage ?  discontinuation of antibiotics    initiation of antibiotics ?   ----------------------------     ----------------------------- ?      PCT 0.25 - 0.50 ng/mL            PCT 0.10 - 0.25 ng/mL ?              OR ?      >80% decrease in PCT            Discourage initiation of ?                                           antibiotics ?     Encourage discontinuation ?          of antibiotics ?   ----------------------------     ----------------------------- ?        PCT >= 0.50 ng/mL              PCT 0.26 - 0.50 ng/mL ?              AND ?       <80% decrease in PCT             Encourage initiation of ?                                            antibiotics ?      Encourage continuation ?          of antibiotics ?   ----------------------------     ----------------------------- ?       PCT >= 0.50 ng/mL                  PCT > 0.50 ng/mL ?              AND ?        increase in PCT                  Strongly encourage ?                                     initiation of antibiotics ?   Strongly encourage escalation ?          of antibiotics ?                                    ----------------------------- ?                                          PCT <= 0.25 ng/mL ?                                                OR ?                                       >  80% decrease in PCT ? ?                                    Discontinue / Do not initiate ?                                            antibiotics ? ?Performed at [facility], [place] [address], [place], [place] ?[address] ?  ?Type and screen [place]     Status: None  ? Collection Time: 06/16/21  3:31 AM  ?Result Value Ref Range  ? ABO/RH(D) O POS   ?  Antibody Screen NEG   ? Sample Expiration    ?  06/19/2021,2359 ?Performed at [facility], [place] [address], [place], [place] [address] ?  ? ? ?CT HEAD WO CONTRAST (5MM) ? ?Result Date: 06/16/2021 ?C

## 2021-06-16 NOTE — ED Provider Notes (Signed)
?[name] ?Provider Note ? ? ?CSN: [id number] ?Arrival date & time: 06/16/21  6767 ? ?  ? ?History ? ?No chief complaint on file. ? ? ?William Kirk is a 82 y.o. male. ? ?Patient is a 81 year old male with past medical history of COPD, peripheral vascular disease, hypertension.  Patient brought for evaluation of fall.  Patient's stays in an extended care facility where he was found on the floor complaining of left hip pain.  Patient uncertain as to whether or not he struck his head, but does not complain of mild headache.  I am told patient is on blood thinners, but appears as though he is on Plavix according to his facility's records.  Patient denies visual disturbances.  He denies any neck pain. ? ?The history is provided by the patient.  ? ?  ? ?Home Medications ?Prior to Admission medications   ?Medication Sig Start Date End Date Taking? Authorizing Provider  ?acetaminophen (TYLENOL) 500 MG tablet Place 1,000 mg into feeding tube in the morning, at noon, and at bedtime. 10/28/19   [provider]  ?albuterol (PROVENTIL) (2.5 MG/3ML) 0.083% nebulizer solution Take 3 mLs (2.5 mg total) by nebulization every 6 (six) hours as needed for wheezing or shortness of breath. 05/07/21   Parrett, Fonnie Mu, NP  ?albuterol (VENTOLIN HFA) 108 (90 Base) MCG/ACT inhaler Inhale 2 puffs into the lungs every 4 (four) hours as needed for wheezing or shortness of breath. 07/11/18   Hongalgi, Lenis Dickinson, MD  ?aspirin EC 81 MG tablet Take 81 mg by mouth daily. Swallow whole.    [provider]  ?atorvastatin (LIPITOR) 80 MG tablet Take 1 tablet (80 mg total) by mouth at bedtime. 02/25/21   Ulyses Amor, PA-C  ?Cholecalciferol (VITAMIN D) 50 MCG (2000 UT) tablet Place 2,000 Units into feeding tube at bedtime.    [provider]  ?clopidogrel (PLAVIX) 75 MG tablet Take 1 tablet (75 mg total) by mouth daily. 01/12/21   Cherre Robins, MD  ?diclofenac sodium (VOLTAREN) 1 %  GEL Apply 2 g topically 4 (four) times daily as needed (pain).     [provider]  ?enoxaparin (LOVENOX) 40 MG/0.4ML injection Inject 0.4 mLs (40 mg total) into the skin daily for 15 days. 06/03/21 06/18/21  Patrecia Pour, MD  ?ferrous sulfate 324 MG TBEC Take 324 mg by mouth every other day.    [provider]  ?fluticasone-salmeterol (WIXELA INHUB) 250-50 MCG/ACT AEPB Inhale 1 puff into the lungs in the morning and at bedtime. 05/07/21   Parrett, Fonnie Mu, NP  ?guaiFENesin-dextromethorphan (ROBITUSSIN DM) 100-10 MG/5ML syrup Place 5 mLs into feeding tube every 12 (twelve) hours as needed for cough.    [provider]  ?lidocaine 4 % Place 1 patch onto the skin daily as needed (pain).     [provider]  ?loperamide (IMODIUM) 1 MG/5ML solution Take 2 mg by mouth every 6 (six) hours as needed for diarrhea or loose stools.    [provider]  ?melatonin 3 MG TABS tablet Take 1 tablet (3 mg total) by mouth at bedtime. 06/01/21   Terrilee Croak, MD  ?methocarbamol (ROBAXIN) 500 MG tablet Take 1 tablet (500 mg total) by mouth 2 (two) times daily as needed for muscle spasms. ?Patient taking differently: Place 500 mg into feeding tube 2 (two) times daily as needed for muscle spasms. Stop date 06-16-21 04/16/20   Jamse Arn, MD  ?methocarbamol (ROBAXIN) 500 MG  tablet Place 250 mg into feeding tube daily. Hold for sedation    [provider]  ?methocarbamol (ROBAXIN) 500 MG tablet Place 500 mg into feeding tube at bedtime.    [provider]  ?metoprolol tartrate (LOPRESSOR) 25 MG tablet Place 12.5 mg into feeding tube daily.    [provider]  ?midodrine (PROAMATINE) 2.5 MG tablet Take 2.5 mg by mouth 2 (two) times daily as needed (SBP < 95).    [provider]  ?Multiple Vitamins-Minerals (MULTIVITAMIN WITH MINERALS) tablet Place 1 tablet into feeding tube daily.    [provider]  ?nutrition supplement, JUVEN, (JUVEN) PACK Place  1 packet into feeding tube 2 (two) times daily between meals. 06/01/21   Terrilee Croak, MD  ?Nutritional Supplements (FEEDING SUPPLEMENT, OSMOLITE 1.5 CAL,) LIQD Place 1,000 mLs into feeding tube continuous. ?Patient taking differently: Place 237 mLs into feeding tube 5 (five) times daily. 06/01/21   Terrilee Croak, MD  ?Nutritional Supplements (FEEDING SUPPLEMENT, PROSOURCE TF,) liquid Place 45 mLs into feeding tube 2 (two) times daily. 06/01/21   Terrilee Croak, MD  ?oxyCODONE-acetaminophen (PERCOCET/ROXICET) 5-325 MG tablet Place into feeding tube every 6 (six) hours as needed for severe pain.    [provider]  ?pantoprazole (PROTONIX) 40 MG tablet Take 1 tablet (40 mg total) by mouth daily. 06/01/21   Terrilee Croak, MD  ?Propylene Glycol (SYSTANE COMPLETE) 0.6 % SOLN Place 1 drop into both eyes daily.    [provider]  ?QUEtiapine (SEROQUEL) 50 MG tablet Place 1 tablet (50 mg total) into feeding tube 2 (two) times daily. ?Patient taking differently: Place 25-50 mg into feeding tube See admin instructions. Taking 25 mg in the morning and 50 mg at bedtime 06/01/21   Terrilee Croak, MD  ?sertraline (ZOLOFT) 100 MG tablet Place 2 tablets (200 mg total) into feeding tube daily. 11/23/18   [name], PA-C  ?Tiotropium Bromide Monohydrate (SPIRIVA RESPIMAT) 2.5 MCG/ACT AERS Inhale 2 puffs into the lungs in the morning. 05/07/21   Parrett, Fonnie Mu, NP  ?Water For Irrigation, Sterile (FREE WATER) SOLN Place 160 mLs into feeding tube every 4 (four) hours. 06/01/21   Terrilee Croak, MD  ?   ? ?Allergies    ?Testosterone   ? ?Review of Systems   ?Review of Systems  ?All other systems reviewed and are negative. ? ?Physical Exam ?Updated Vital Signs ?Ht 6' (1.829 m)   Wt 79.4 kg   BMI 23.73 kg/m?  ?Physical Exam ?Vitals and nursing note reviewed.  ?Constitutional:   ?   General: He is not in acute distress. ?   Appearance: He is well-developed. He is not diaphoretic.  ?HENT:  ?   Head: Normocephalic and  atraumatic.  ?Eyes:  ?   Extraocular Movements: Extraocular movements intact.  ?   Pupils: Pupils are equal, round, and reactive to light.  ?Neck:  ?   Comments: There is no cervical spine tenderness.  Patient has painless range of motion in all directions. ?Cardiovascular:  ?   Rate and Rhythm: Normal rate and regular rhythm.  ?   Heart sounds: No murmur heard. ?  No friction rub.  ?Pulmonary:  ?   Effort: Pulmonary effort is normal. No respiratory distress.  ?   Breath sounds: Normal breath sounds. No wheezing or rales.  ?Abdominal:  ?   General: Bowel sounds are normal. There is no distension.  ?   Palpations: Abdomen is soft.  ?   Tenderness: There is no abdominal  tenderness.  ?Musculoskeletal:     ?   General: Normal range of motion.  ?   Cervical back: Normal range of motion and neck supple.  ?   Comments: There is tenderness to palpation over the lateral aspect of the left hip.  There is no obvious deformity.  DP pulses are palpable and motor and sensation are intact throughout the foot.  ?Skin: ?   General: Skin is warm and dry.  ?Neurological:  ?   General: No focal deficit present.  ?   Mental Status: He is alert and oriented to person, place, and time.  ?   Cranial Nerves: No cranial nerve deficit.  ?   Coordination: Coordination normal.  ? ? ?ED Results / Procedures / Treatments   ?Labs ?(all labs ordered are listed, but only abnormal results are displayed) ?Labs Reviewed  ?CBC  ?BASIC METABOLIC PANEL  ?TYPE AND SCREEN  ? ? ?EKG ?None ? ?Radiology ?No results found. ? ?Procedures ?Procedures  ? ? ?Medications Ordered in ED ?Medications - No data to display ? ?ED Course/ Medical Decision Making/ A&P ? ?Patient brought by EMS as a level 2 trauma secondary to a fall on blood thinners.  He is complaining of head and left hip pain.  He arrives here with stable vital signs and intact airway. ? ?Imaging studies obtained show a left intertrochanteric hip fracture.  CT scan of the head and cervical spine is  unremarkable and laboratory studies also unremarkable with the exception of anemia. ? ?The hip fracture was discussed with Dr. Zachery Dakins from orthopedics.  He is recommending admission to the hospitalist service

## 2021-06-16 NOTE — Interval H&P Note (Signed)
Patient has dementia and altered mental status at baseline.  Spoke to the patient's wife who reports that overnight at his nursing facility he tried to get out of bed alone and sustained a fall.  He was brought to the emergency room where x-rays demonstrated a stable intertrochanteric femur fracture.  Discussed plan for fixation with cephalomedullary nail. ? ?The risks benefits and alternatives were discussed with the patient including but not limited to the risks of nonoperative treatment, versus surgical intervention including infection, bleeding, nerve injury, malunion, nonunion, the need for revision surgery, hardware prominence, hardware failure, the need for hardware removal, blood clots, cardiopulmonary complications, morbidity, mortality, among others, and they were willing to proceed.  Consent was signed by myself and the patient's wife who gave phone consent.  Left hip was marked.  ? ?The operative side was examined and the patient was confirmed to have. Patient unable to participate with sensory exam, Motor EHL, ext, flex 5/5, and DP 2+, No significant edema. ? ?

## 2021-06-16 NOTE — ED Notes (Signed)
Pt transferred to pacu  ?

## 2021-06-17 DIAGNOSIS — E43 Unspecified severe protein-calorie malnutrition: Secondary | ICD-10-CM

## 2021-06-17 DIAGNOSIS — Y92129 Unspecified place in nursing home as the place of occurrence of the external cause: Secondary | ICD-10-CM

## 2021-06-17 DIAGNOSIS — L89611 Pressure ulcer of right heel, stage 1: Secondary | ICD-10-CM

## 2021-06-17 DIAGNOSIS — R4189 Other symptoms and signs involving cognitive functions and awareness: Secondary | ICD-10-CM | POA: Diagnosis not present

## 2021-06-17 DIAGNOSIS — Z7189 Other specified counseling: Secondary | ICD-10-CM

## 2021-06-17 DIAGNOSIS — I739 Peripheral vascular disease, unspecified: Secondary | ICD-10-CM

## 2021-06-17 DIAGNOSIS — W19XXXA Unspecified fall, initial encounter: Secondary | ICD-10-CM

## 2021-06-17 DIAGNOSIS — J438 Other emphysema: Secondary | ICD-10-CM | POA: Diagnosis not present

## 2021-06-17 DIAGNOSIS — F419 Anxiety disorder, unspecified: Secondary | ICD-10-CM | POA: Diagnosis not present

## 2021-06-17 DIAGNOSIS — S72002A Fracture of unspecified part of neck of left femur, initial encounter for closed fracture: Secondary | ICD-10-CM | POA: Diagnosis not present

## 2021-06-17 DIAGNOSIS — Z96641 Presence of right artificial hip joint: Secondary | ICD-10-CM

## 2021-06-17 LAB — BASIC METABOLIC PANEL
Anion gap: 8 (ref 5–15)
BUN: 28 mg/dL — ABNORMAL HIGH (ref 8–23)
CO2: 24 mmol/L (ref 22–32)
Calcium: 8 mg/dL — ABNORMAL LOW (ref 8.9–10.3)
Chloride: 105 mmol/L (ref 98–111)
Creatinine, Ser: 1.15 mg/dL (ref 0.61–1.24)
GFR, Estimated: >60 mL/min (ref >60)
Glucose, Bld: 114 mg/dL — ABNORMAL HIGH (ref 70–99)
Potassium: 4.5 mmol/L (ref 3.5–5.1)
Sodium: 137 mmol/L (ref 135–145)

## 2021-06-17 LAB — VITAMIN B12: Vitamin B-12: 774 pg/mL (ref 180–914)

## 2021-06-17 LAB — CBC
HCT: 23.2 % — ABNORMAL LOW (ref 39.0–52.0)
HCT: 20.6 % — ABNORMAL LOW (ref 39.0–52.0)
Hemoglobin: 7.1 g/dL — ABNORMAL LOW (ref 13.0–17.0)
Hemoglobin: 6.4 g/dL — CL (ref 13.0–17.0)
MCH: 27 pg (ref 26.0–34.0)
MCH: 26.9 pg (ref 26.0–34.0)
MCHC: 30.6 g/dL (ref 30.0–36.0)
MCHC: 31.1 g/dL (ref 30.0–36.0)
MCV: 88.2 fL (ref 80.0–100.0)
MCV: 86.6 fL (ref 80.0–100.0)
Platelets: 361 10*3/uL (ref 150–400)
Platelets: 327 10*3/uL (ref 150–400)
RBC: 2.63 MIL/uL — ABNORMAL LOW (ref 4.22–5.81)
RBC: 2.38 MIL/uL — ABNORMAL LOW (ref 4.22–5.81)
RDW: 16 % — ABNORMAL HIGH (ref 11.5–15.5)
RDW: 15.9 % — ABNORMAL HIGH (ref 11.5–15.5)
WBC: 11.9 10*3/uL — ABNORMAL HIGH (ref 4.0–10.5)
WBC: 13.1 10*3/uL — ABNORMAL HIGH (ref 4.0–10.5)
nRBC: 0 % (ref 0.0–0.2)
nRBC: 0 % (ref 0.0–0.2)

## 2021-06-17 LAB — IRON AND TIBC
Iron: 12 ug/dL — ABNORMAL LOW (ref 45–182)
Saturation Ratios: 8 % — ABNORMAL LOW (ref 17.9–39.5)
TIBC: 147 ug/dL — ABNORMAL LOW (ref 250–450)
UIBC: 135 ug/dL

## 2021-06-17 LAB — GLUCOSE, CAPILLARY
Glucose-Capillary: 76 mg/dL (ref 70–99)
Glucose-Capillary: 84 mg/dL (ref 70–99)
Glucose-Capillary: 124 mg/dL — ABNORMAL HIGH (ref 70–99)

## 2021-06-17 LAB — RETICULOCYTES
Immature Retic Fract: 33.9 % — ABNORMAL HIGH (ref 2.3–15.9)
RBC.: 2.35 MIL/uL — ABNORMAL LOW (ref 4.22–5.81)
Retic Count, Absolute: 62.7 10*3/uL (ref 19.0–186.0)
Retic Ct Pct: 2.7 % (ref 0.4–3.1)

## 2021-06-17 LAB — PREPARE RBC (CROSSMATCH)

## 2021-06-17 LAB — FOLATE: Folate: 14.1 ng/mL (ref >5.9)

## 2021-06-17 LAB — FERRITIN: Ferritin: 503 ng/mL — ABNORMAL HIGH (ref 24–336)

## 2021-06-17 MED ORDER — ACETAMINOPHEN 325 MG PO TABS
650.0000 mg | ORAL_TABLET | Freq: Four times a day (QID) | ORAL | Status: DC | PRN
  Administered 2021-06-17 – 2021-06-19 (×2): 650 mg via ORAL
  Filled 2021-06-17 (×2): qty 2

## 2021-06-17 MED ORDER — ADULT MULTIVITAMIN W/MINERALS CH
1.0000 | ORAL_TABLET | Freq: Every day | ORAL | Status: DC
  Administered 2021-06-17 – 2021-06-20 (×4): 1
  Filled 2021-06-17 (×4): qty 1

## 2021-06-17 MED ORDER — MIDODRINE HCL 5 MG PO TABS
2.5000 mg | ORAL_TABLET | Freq: Three times a day (TID) | ORAL | Status: DC
  Administered 2021-06-17 – 2021-06-21 (×12): 2.5 mg
  Filled 2021-06-17 (×12): qty 1

## 2021-06-17 MED ORDER — FREE WATER
120.0000 mL | Freq: Every day | Status: DC
  Administered 2021-06-17 – 2021-06-21 (×25): 120 mL

## 2021-06-17 MED ORDER — PROSOURCE TF PO LIQD
45.0000 mL | Freq: Two times a day (BID) | ORAL | Status: DC
  Administered 2021-06-17 – 2021-06-21 (×8): 45 mL
  Filled 2021-06-17 (×10): qty 45

## 2021-06-17 MED ORDER — ORAL CARE MOUTH RINSE
15.0000 mL | Freq: Two times a day (BID) | OROMUCOSAL | Status: DC
  Administered 2021-06-17 – 2021-06-21 (×6): 15 mL via OROMUCOSAL

## 2021-06-17 MED ORDER — ZINC SULFATE 220 (50 ZN) MG PO CAPS
220.0000 mg | ORAL_CAPSULE | Freq: Every day | ORAL | Status: DC
Start: 2021-06-18 — End: 2021-06-21
  Administered 2021-06-18 – 2021-06-21 (×4): 220 mg
  Filled 2021-06-17 (×4): qty 1

## 2021-06-17 MED ORDER — ASCORBIC ACID 500 MG PO TABS
250.0000 mg | ORAL_TABLET | Freq: Two times a day (BID) | ORAL | Status: DC
  Administered 2021-06-17 – 2021-06-21 (×9): 250 mg
  Filled 2021-06-17 (×9): qty 1

## 2021-06-17 MED ORDER — OXYCODONE HCL 5 MG/5ML PO SOLN
5.0000 mg | Freq: Four times a day (QID) | ORAL | Status: DC | PRN
  Administered 2021-06-17 – 2021-06-21 (×6): 5 mg
  Filled 2021-06-17 (×6): qty 5

## 2021-06-17 MED ORDER — OSMOLITE 1.2 CAL PO LIQD: 1000.0000 mL | ORAL | Status: DC

## 2021-06-17 MED ORDER — OSMOLITE 1.5 CAL PO LIQD
237.0000 mL | Freq: Every day | ORAL | Status: DC
  Administered 2021-06-17 – 2021-06-21 (×25): 237 mL
  Filled 2021-06-17 (×27): qty 237

## 2021-06-17 MED ORDER — SODIUM CHLORIDE 0.9% IV SOLUTION: Freq: Once | INTRAVENOUS | Status: AC

## 2021-06-17 MED ORDER — JUVEN PO PACK
1.0000 | PACK | Freq: Two times a day (BID) | ORAL | Status: DC
  Administered 2021-06-17 – 2021-06-21 (×9): 1
  Filled 2021-06-17 (×10): qty 1

## 2021-06-17 MED ORDER — CHLORHEXIDINE GLUCONATE 0.12 % MT SOLN
15.0000 mL | Freq: Two times a day (BID) | OROMUCOSAL | Status: DC
  Administered 2021-06-17 – 2021-06-21 (×5): 15 mL via OROMUCOSAL
  Filled 2021-06-17 (×7): qty 15

## 2021-06-17 NOTE — Assessment & Plan Note (Signed)
Stable. ?-Continue nebulizers ?

## 2021-06-17 NOTE — Assessment & Plan Note (Addendum)
Remains full code as of 5/14 per patient's wife.  She has not talked to his sister yet.  See [place] discussion with patient's wife on 5/11.  ?-Palliative follow-up at SNF ?

## 2021-06-17 NOTE — Assessment & Plan Note (Addendum)
CT neck showed new LUL.  He was started on ceftriaxone and azithromycin but no respiratory symptoms..   Procalcitonin might not be reliable after orthopedic surgery. ?-Stable off antibiotics. ?

## 2021-06-17 NOTE — Evaluation (Signed)
Occupational Therapy Evaluation ?Patient Details ?Name: William Kirk ?MRN: [id number] ?DOB: 1940/12/17 ?Today's Date: 06/17/2021 ? ? ?History of Present Illness 81 y.o. male with medical history significant for COPD, dysphagia, hypertension, PAD, depression, anxiety, CKD stage II, and ORIF of periprosthetic right femur fracture on 05/21/2021 (posterior precautions, 20% PWB) admitted with a fall at his SNF, Dx of L intertrochanteric fx of femur, s/p IM nail 06/16/21, WBAT LLE.  ? ?Clinical Impression ?  ?William Kirk is a 81 year old man who is pleasant and agreeable to therapy but has a history of dementia. On evaluation he exhibits very good upper body strength, pain in left hip, decreased ROM and strength in bilateral lower extremities, right foot drop (unknown etiology), decreased activity tolerance and impaired balance. Mid evaluation RN reported patient having low HGB so transfer to edge of bed only. Patient's BP 83/55 and reports "a tad" of dizziness. Patient will benefit from skilled OT services while in hospital to improve deficits and learn compensatory strategies as needed in order to improve functional abilities in order to reduce caregiver burden.  ?   ? ?Recommendations for follow up therapy are one component of a multi-disciplinary discharge planning process, led by the attending physician.  Recommendations may be updated based on patient status, additional functional criteria and insurance authorization.  ? ?Follow Up Recommendations ? Skilled nursing-short term rehab (<3 hours/day)  ?  ?Assistance Recommended at Discharge Frequent or constant Supervision/Assistance  ?Patient can return home with the following A lot of help with walking and/or transfers;A lot of help with bathing/dressing/bathroom;Assistance with cooking/housework;Direct supervision/assist for medications management;Direct supervision/assist for financial management;Help with stairs or ramp for entrance;Assist for  transportation ? ?  ?Functional Status Assessment ? Patient has had a recent decline in their functional status and demonstrates the ability to make significant improvements in function in a reasonable and predictable amount of time.  ?Equipment Recommendations ? None recommended by OT  ?  ?Recommendations for Other Services   ? ? ?  ?Precautions / Restrictions Precautions ?Precautions: Posterior Hip;Fall ?Precaution Booklet Issued: Yes (comment) ?Precaution Comments: pt cannot verbalize precautions for R hip ?Required Braces or Orthoses: Splint/Cast ?Splint/Cast: AFO R foot for in bed ?Restrictions ?Weight Bearing Restrictions: Yes ?RLE Weight Bearing: Partial weight bearing ?RLE Partial Weight Bearing Percentage or Pounds: 20 ?LLE Weight Bearing: Weight bearing as tolerated  ? ?  ? ?Mobility Bed Mobility ?  ?  ?  ?  ?  ?  ?  ?  ?  ? ?Transfers ?  ?  ?  ?  ?  ?  ?  ?  ?  ?  ?  ? ?  ?Balance Overall balance assessment: History of Falls, Needs assistance ?Sitting-balance support: No upper extremity supported, Feet supported ?Sitting balance-Leahy Scale: Fair ?  ?  ?Standing balance support: Bilateral upper extremity supported, Reliant on assistive device for balance ?Standing balance-Leahy Scale: Poor ?  ?  ?  ?  ?  ?  ?  ?  ?  ?  ?  ?  ?   ? ?ADL either performed or assessed with clinical judgement  ? ?ADL Overall ADL's : Needs assistance/impaired ?Eating/Feeding: NPO ?  ?Grooming: Set up;Sitting ?  ?Upper Body Bathing: Set up;Sitting ?  ?Lower Body Bathing: Maximal assistance;Sitting/lateral leans ?  ?Upper Body Dressing : Set up;Sitting ?  ?Lower Body Dressing: Maximal assistance;Bed level ?  ?  ?Toilet Transfer Details (indicate cue type and reason): deferred ?Toileting- Clothing Manipulation and Hygiene: Total  assistance;Bed level ?  ?  ?  ?  ?General ADL Comments: Only able to transfer to edge of bed due to mild dizziness and found to have low HGB mid-evaluation.  ? ? ? ?Vision Patient Visual Report: No change  from baseline ?   ?   ?Perception   ?  ?Praxis   ?  ? ?Pertinent Vitals/Pain Pain Assessment ?Pain Assessment: Faces ?Faces Pain Scale: Hurts little more ?Pain Location: L hip with activity ?Pain Descriptors / Indicators: Grimacing ?Pain Intervention(s): Limited activity within patient's tolerance  ? ? ? ?Hand Dominance   ?  ?Extremity/Trunk Assessment Upper Extremity Assessment ?Upper Extremity Assessment: RUE deficits/detail;LUE deficits/detail ?RUE Deficits / Details: WFL ROM, 5/5 strength ?RUE Sensation: WNL ?RUE Coordination: WNL ?LUE Deficits / Details: WFl ROm, 5/5 strength ?LUE Sensation: WNL ?LUE Coordination: WNL ?  ?Lower Extremity Assessment ?Lower Extremity Assessment: Defer to PT evaluation ?RLE Deficits / Details: knee ext +4/5, hip flexion AAROM to 45* without pain, h/o of R foot drop (ankle in AFO, did not re-assess DF/PF) ?RLE Sensation: WNL ?LLE Deficits / Details: knee ext AROM -20* ?LLE Sensation: WNL ?  ?Cervical / Trunk Assessment ?Cervical / Trunk Assessment: Normal ?  ?Communication Communication ?Communication: No difficulties ?  ?Cognition Arousal/Alertness: Awake/alert ?Behavior During Therapy: [facility] for tasks assessed/performed ?Overall Cognitive Status: History of cognitive impairments - at baseline ?  ?  ?  ?  ?  ?  ?  ?  ?  ?  ?  ?  ?  ?  ?  ?  ?General Comments: pt unable to recall precautions or WB status, pleasant affect, oriented to self, stated he's 81 years old and that he's in "[facility] therapy" ?  ?  ?General Comments    ? ?  ?Exercises   ?  ?Shoulder Instructions    ? ? ?Home Living Family/patient expects to be discharged to:: Skilled nursing facility ?  ?  ?  ?  ?  ?  ?  ?  ?  ?  ?  ?  ?  ?  ?  ?  ?  ?  ? ?  ?Prior Functioning/Environment   ?  ?  ?  ?  ?  ?  ?Mobility Comments: walking independently prior to fx in April, at [place] since d/c from hospital ?  ?  ? ?  ?  ?OT Problem List: Decreased strength;Decreased range of motion;Decreased activity tolerance;Decreased  cognition;Decreased safety awareness;Decreased knowledge of use of DME or AE;Decreased knowledge of precautions;Pain ?  ?   ?OT Treatment/Interventions: Self-care/ADL training;DME and/or AE instruction;Therapeutic exercise;Patient/family education;Visual/perceptual remediation/compensation;Balance training;Therapeutic activities;Neuromuscular education  ?  ?OT Goals(Current goals can be found in the care plan section) Acute Rehab OT Goals ?OT Goal Formulation: Patient unable to participate in goal setting ?Time For Goal Achievement: 07/01/21 ?Potential to Achieve Goals: Fair  ?OT Frequency: Min 2X/week ?  ? ?Co-evaluation PT/OT/SLP Co-Evaluation/Treatment: Yes (coeval) ?  ?  ?  ?  ? ?  ?AM-PAC OT "6 Clicks" Daily Activity     ?Outcome Measure Help from another person eating meals?: Total (NPO) ?Help from another person taking care of personal grooming?: A Little ?Help from another person toileting, which includes using toliet, bedpan, or urinal?: Total ?Help from another person bathing (including washing, rinsing, drying)?: A Lot ?Help from another person to put on and taking off regular upper body clothing?: A Little ?Help from another person to put on and taking off regular lower body clothing?: Total ?6 Click Score: 11 ?  ?  End of Session Nurse Communication: Mobility status ? ?Activity Tolerance: Patient tolerated treatment well ?Patient left: in bed;with call bell/phone within reach;with bed alarm set ? ?OT Visit Diagnosis: Other abnormalities of gait and mobility (R26.89);Pain;Repeated falls (R29.6) ?Pain - Right/Left: Left ?Pain - part of body: Hip  ?              ?Time: 5638-7564 ?OT Time Calculation (min): 18 min ?Charges:  OT General Charges ?$OT Visit: 1 Visit ?OT Evaluation ?$OT Eval Low Complexity: 1 Low ? ?Loza Prell, OTR/L ?Acute Care Rehab Services  ?Office [phone] ?Pager: [phone]  ? ?Ahmet Schank L Leor Whyte ?06/17/2021, 4:31 PM ?

## 2021-06-17 NOTE — Assessment & Plan Note (Addendum)
Seems to have some degree of cognitive impairment.  He is awake and alert but only oriented to self and place Not oriented to time.  Follows commands. ?-Reorientation and delirium precaution ?-Continue home Seroquel ?

## 2021-06-17 NOTE — Hospital Course (Addendum)
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  CT head and cervical spine without acute finding.  X-ray showed intertrochanteric left femoral fracture.  ? ?Patient underwent intramedullary nailing on 06/16/2021.  Postop, Hgb dropped to 6.4 (baseline 7-8).  Transfused 2 units with appropriate response.  Received IV iron as well.  Plavix and aspirin resumed. HH stable. ? ?Medically optimized for discharge to SNF pending insurance authorization. ?

## 2021-06-17 NOTE — Assessment & Plan Note (Addendum)
On tube feed per PEG tube ?-TF per dietitian. ?

## 2021-06-17 NOTE — Assessment & Plan Note (Addendum)
On aspirin, Plavix and Lipitor at home. ?-Continue Lipitor, Plavix and aspirin ?

## 2021-06-17 NOTE — Progress Notes (Signed)
Initial Nutrition Assessment ? ?DOCUMENTATION CODES:  ? ?Severe malnutrition in context of chronic illness ? ?INTERVENTION:  ?- will order 1 carton (237 ml) Osmolite 1.5 x6/day with 45 ml Prosource TF BID, 1 packet Juven BID, and 60 ml free water before and 60 ml free water after each TF bolus.  ? ?- this regimen will provide 2400 kcal, 116 grams protein, and 1806 ml free water.  ? ?- will order 250 mg ascorbic acid BID and 220 mg zinc sulfate once/day (cleared by MD). ? ?- will order 1 tablet multivitamin with minerals/day.  ? ? ?NUTRITION DIAGNOSIS:  ? ?Severe Malnutrition related to chronic illness as evidenced by severe fat depletion, severe muscle depletion. ? ?GOAL:  ? ?Patient will meet greater than or equal to 90% of their needs ? ?MONITOR:  ? ?TF tolerance, Labs, Weight trends, Skin ? ?REASON FOR ASSESSMENT:  ? ?Malnutrition Screening Tool, Consult ?Enteral/tube feeding initiation and management, Hip fracture protocol ? ?ASSESSMENT:  ? ?81 y.o. male with medical history of COPD, dysphagia, HTN, PAD, depression, anxiety, stage 2 CKD, arthritis, and ORIF of periprosthetic R femur fracture on 05/21/2021. He was recently admitted and discharged back to [facility] at the time of d/c. He returned to the ED from [facility] due to L hip pain after a fall. He has confusion at baseline. In the ED he was found to have closed L hip fracture and on 5/10 underwent L hip IM nail. ? ?POD #1 L hip IM nail. ? ?Patient and his wife are known to this RD from recent admission. Patient laying in bed and his wife is at bedside. She shares that he returned to [facility] after d/c and that he got out of bed unassisted overnight the night PTA.  ? ?Patient has been strictly NPO (sometimes able to have tsp of water) and PEG in place. He has been receiving and tolerating bolus TF regimen. Will order as outlined above.  ? ?Wife denies any nutrition-related questions or concerns.  ? ?Weight yesterday was documented as 175 lb which  appears to be a stated weight. Weight on 5/6 was 152 lb and weight on 4/26 was 157 lb.  ? ? ?Labs reviewed; BUN: 28 mg/dl, Ca: 8 mg/dl. ?Medications reviewed; 3 mg melatonin per PEG/night, 40 mg protonix per PEG/day. ?  ? ?NUTRITION - FOCUSED PHYSICAL EXAM: ? ?Flowsheet Row Most Recent Value  ?Orbital Region Mild depletion  ?Upper Arm Region Moderate depletion  ?Thoracic and Lumbar Region Severe depletion  ?Buccal Region Moderate depletion  ?Temple Region Mild depletion  ?Clavicle Bone Region Severe depletion  ?Clavicle and Acromion Bone Region Severe depletion  ?Scapular Bone Region Severe depletion  ?Dorsal Hand Severe depletion  ?Patellar Region Severe depletion  ?Anterior Thigh Region Severe depletion  ?Posterior Calf Region Severe depletion  ?Edema (RD Assessment) None  ?Hair Reviewed  ?Eyes Reviewed  ?Mouth Reviewed  ?Skin Reviewed  ?Nails Reviewed  ? ?  ? ? ?Diet Order:   ?Diet Order   ? ? None  ? ?  ? ? ?EDUCATION NEEDS:  ? ?Education needs have been addressed ? ?Skin:  Skin Assessment: Skin Integrity Issues: ?Skin Integrity Issues:: Other (Comment), Unstageable, Incisions ?Unstageable: full thickness to L foot and R arm ?Incisions: L hip and L arm (5/10) ?Other: MASD to bilateral buttocks ? ?Last BM:  5/11 (type 7 x1, medium amount) ? ?Height:  ? ?Ht Readings from Last 1 Encounters:  ?06/16/21 6' (1.829 m)  ? ? ?Weight:  ? ?Wt  Readings from Last 1 Encounters:  ?06/16/21 79.4 kg  ? ? ? ?BMI:  Body mass index is 23.73 kg/m?. ? ?Estimated Nutritional Needs:  ?Kcal:  2300-2550 kcal ?Protein:  115-130 grams ?Fluid:  >/= 2.3 L/day ? ? ? ? ?Jarome Matin, MS, RD, LDN ?Registered Dietitian II ?Inpatient Clinical Nutrition ?RD pager # and on-call/weekend pager # available in [place]  ? ?

## 2021-06-17 NOTE — Progress Notes (Signed)
?PROGRESS NOTE ? ?William Kirk XLK:[id number] DOB: 1940-09-18  ? ?PCP: Floreen Comber, PA-C ? ?Patient is from: SNF ? ?DOA: 06/16/2021 LOS: 1 ? ?Chief complaints ?Chief Complaint  ?Patient presents with  ? Fall  ?  ? 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  CT head and cervical spine without acute finding.  X-ray showed intertrochanteric left femoral fracture.  ? ?Patient underwent intramedullary nailing on 06/16/2021.  Postop, hemoglobin dropped to 6.4 (baseline 7-8).  ? ?Subjective: ?Seen and examined earlier this morning.  No major events overnight of this morning.  He has no complaints but not a great historian.  He is awake and alert but only oriented to self, "hospital and [facility]".  Follows commands.  Not oriented to time.  He denies pain or difficulty breathing. ? ?Objective: ?Vitals:  ? 06/17/21 0538 06/17/21 0541 06/17/21 0832 06/17/21 1406  ?BP: (!) 111/55   113/61  ?Pulse: 83 81  74  ?Resp: 16   16  ?Temp: 98.2 ?F (36.8 ?C)   (!) 97.4 ?F (36.3 ?C)  ?TempSrc: Oral   Oral  ?SpO2: 90% 96% 97% 96%  ?Weight:      ?Height:      ? ? ?Examination: ? ?GENERAL: No apparent distress.  Nontoxic. ?HEENT: MMM.  Vision and hearing grossly intact.  ?NECK: Supple.  No apparent JVD.  ?RESP:  No IWOB.  Fair aeration bilaterally. ?CVS:  RRR. Heart sounds normal.  ?ABD/GI/GU: BS+. Abd soft, NTND.  ?MSK/EXT:  Moves extremities. No apparent deformity. No edema.  ?SKIN: Right thigh surgical wound healed.  Dressing over left thigh surgical wound DCI.  No bruising, ecchymosis or hematoma. ?NEURO: Awake and alert.  Oriented to self, "hospital and [facility].  Follows commands.  No  apparent focal neuro deficit. ?PSYCH: Calm. Normal affect.  ? ?Procedures:  ?5/10-intramedullary nailing of left intertrochanteric femoral fracture ? ?Microbiology summarized: ?Sputum culture pending. ? ?Assessment and Plan: ?* Closed fracture of left hip likely due to unwitnessed fall at nursing home ?X-ray showed intertrochanteric left femoral bone fracture. ?-S/p intramedullary nailing by Dr. Blanchie Dessert on 5/10. ?-WBAT as tolerated on LLE ?-Subcu Lovenox for VTE prophylaxis for 4-week-holding today until H&H stable ?-SCD for VTE prophylaxis today ?-Outpatient follow-up in 2 weeks for wound check ?-Pain control per orthopedic surgery. ?-PT/OT ? ?Cognitive impairment ?Seems to have some degree of cognitive impairment.  He is awake and alert but only oriented to self "hospital, [facility]".  Not oriented to time.  Follows commands. ?-Reorientation and delirium precaution ?-Continue home Seroquel ? ?Goals of care, counseling/discussion ?Patient with significant comorbidity including cognitive impairment, chronic dysphagia with tube feed dependence, bilateral hip fracture.... Poor long-term prognosis.  I have discussed CODE STATUS including pros and cons of CPR and intubation with patient's wife.  Patient's wife understand that DNR/DNI is to patient's best interest but has to discuss this with patient's sister before making decision about CODE STATUS.  For now, she prefers to stay as full code.  I offered palliative care consult but she prefers to wait until she talks to patient's sister. ? ?Iron deficiency anemia ?Recent Labs  ?  05/28/21 ?0825 05/29/21 ?0808 05/30/21 ?0135 05/31/21 ?3664 06/01/21 ?0231 06/10/21 ?0406 06/11/21 ?4034 06/16/21 ?7425 06/17/21 ?0501 06/17/21 ?1140  ?  HGB 8.4* 7.0* 7.3* 7.1* 8.6* 8.9* 7.9* 9.1* 7.1* 6.4*  ?Some drop in Hgb postop.  No obvious bleeding at surgical site or from GI.  Anemia panel with some degree of iron deficiency ?-Transfuse 1 unit.  Patient's wife consented verbally ?-IV  iron on 5/12 ?-Hold Plavix, aspirin and subcu Lovenox until H&H stable ? ? ? ?Chronic obstructive pulmonary disease (HCC) ?Stable. ?-Continue nebulizers ? ?Dysphagia ?On tube feed per PEG tube ?-Dietitian consulted for tube feed ?-SLP consulted on admission for dysphagia evaluation ? ?Protein-calorie malnutrition, severe (HCC) ?Nutrition Status: ?Nutrition Problem: Severe Malnutrition ?Etiology: chronic illness ?Signs/Symptoms: severe fat depletion, severe muscle depletion ?Interventions: Tube feeding, Prostat, Juven ? ? ?PVD and carotid artery stenosis s/p right transcarotid artery revascularization ?On aspirin, Plavix and Lipitor at home. ?-Continue Lipitor ?-Holding aspirin and Plavix until H&H stable. ? ?H/O total right hip arthroplasty ?S/p recent right periprosthetic femoral fracture repair on 05/21/2021.  Stable. ?-Per orthopedic surgery ? ?Pulmonary infiltrates ?Patient has chronic dysphagia/aspiration.  He has some leukocytosis likely demargination versus true infection.  CT neck showed new LUL.  He was started on ceftriaxone and azithromycin.  Patient does not seem to have respiratory distress.  Procalcitonin might not be reliable after orthopedic surgery. ?-Discontinue ceftriaxone and azithromycin. ?-Monitor off antibiotics ? ?HTN (hypertension) ?Slightly hypotensive. ?-Resume home midodrine ? ?Anxiety and depression ?Stable.  Continue home medication ? ? ?Pressure skin injury: POA ?Pressure Injury Heel Posterior;Right Stage 1 -  Intact skin with non-blanchable redness of a localized area usually over a bony prominence. previous stage 1 with large blister now (Active)  ?   ?Location: Heel  ?Location Orientation: Posterior;Right  ?Staging: Stage 1 -  Intact skin with non-blanchable redness of a localized area usually over a bony prominence.  ?Wound Description (Comments): previous stage 1 with large blister now  ?Present on Admission: Yes  ?Dressing Type Foam - Lift dressing to assess site every shift  06/17/21 0900  ?   ?Pressure Injury 06/10/21 Foot Anterior;Left Unstageable - Full thickness tissue loss in which the base of the injury is covered by slough (yellow, tan, gray, green or brown) and/or eschar (tan, brown or black) in the wound bed. small area with  scab (Active)  ?06/10/21 1319  ?Location: Foot  ?Location Orientation: Anterior;Left  ?Staging: Unstageable - Full thickness tissue loss in which the base of the injury is covered by slough (yellow, tan, gray, green or brown) and/or eschar (tan, brown or black) in the wound bed.  ?Wound Description (Comments): small area with  scab  ?Present on Admission: Yes  ?Dressing Type None 06/17/21 0900  ? ?DVT prophylaxis:  ?Place and maintain sequential compression device Start: 06/17/21 1333 ?SCDs Start: 06/16/21 8657 ? ?Code Status: Full code ?Family Communication: Updated patient's wife over the phone. ?Level of care: Telemetry.  Discontinue telemetry and transition to MedSurg ?Status is: Inpatient ?Remains inpatient appropriate because: Due to left hip fracture and anemia ? ? ?Final disposition: SNF ?Consultants:  ?Orthopedic surgery ? ?Sch Meds:  ?Scheduled Meds: ? sodium chloride   Intravenous Once  ? arformoterol  15 mcg Nebulization BID  ? vitamin C  250 mg Per Tube BID  ? atorvastatin  80 mg Per Tube QHS  ? budesonide (PULMICORT) nebulizer solution  0.25 mg Nebulization BID  ? chlorhexidine  15 mL Mouth Rinse BID  ? feeding supplement (OSMOLITE 1.5 CAL)  237 mL Per Tube 6 X Daily  ? feeding supplement (PROSource TF)  45 mL Per Tube BID  ?  free water  120 mL Per Tube 6 X Daily  ? mouth rinse  15 mL Mouth Rinse q12n4p  ? melatonin  3 mg Per Tube QHS  ? metoprolol tartrate  12.5 mg Per Tube Daily  ? midodrine  2.5 mg Per Tube TID  ? multivitamin with minerals  1 tablet Per Tube Daily  ? nutrition supplement (JUVEN)  1 packet Per Tube BID BM  ? pantoprazole sodium  40 mg Per Tube Daily  ? QUEtiapine  25 mg Per Tube Daily  ? And  ? QUEtiapine  50 mg Per Tube  QHS  ? revefenacin  175 mcg Nebulization Daily  ? sertraline  200 mg Per Tube Daily  ? [START ON 06/18/2021] zinc sulfate  220 mg Per Tube Daily  ? ?Continuous Infusions: ? methocarbamol (ROBAXIN) IV 500 mg (05/11

## 2021-06-17 NOTE — Progress Notes (Signed)
? ? ? ?  Subjective: ? ?Patient reports pain as well controlled this morning.  Alert but disoriented.  Denies distal numbness and tingling.  No issues overnight. ? ?Objective:  ? ?VITALS:   ?Vitals:  ? 06/16/21 1956 06/16/21 2319 06/17/21 0538 06/17/21 0541  ?BP:  (!) 122/51 (!) 111/55   ?Pulse: 83 84 83 81  ?Resp: '20 16 16   '$ ?Temp:  97.8 ?F (36.6 ?C) 98.2 ?F (36.8 ?C)   ?TempSrc:  Oral Oral   ?SpO2: 91% 100% 90% 96%  ?Weight:      ?Height:      ? ?LLE ?Intact pulses distally ?Dorsiflexion/Plantar flexion intact ?Incision: dressing C/D/I ?Compartment soft ? ?RLE ?Intact pulses distally ?Plantar flexion intact, no active dorsiflexion ?Incision: dressing C/D/I ?Compartment soft ? ?Lab Results  ?Component Value Date  ? WBC 11.9 (H) 06/17/2021  ? HGB 7.1 (L) 06/17/2021  ? HCT 23.2 (L) 06/17/2021  ? MCV 88.2 06/17/2021  ? PLT 361 06/17/2021  ? ?BMET ?   ?Component Value Date/Time  ? NA 137 06/17/2021 0501  ? K 4.5 06/17/2021 0501  ? CL 105 06/17/2021 0501  ? CO2 24 06/17/2021 0501  ? GLUCOSE 114 (H) 06/17/2021 0501  ? BUN 28 (H) 06/17/2021 0501  ? CREATININE 1.15 06/17/2021 0501  ? CALCIUM 8.0 (L) 06/17/2021 0501  ? GFRNONAA >60 06/17/2021 0501  ? ? ?Xray: Postop x-rays demonstrate left intertrochanteric femur fracture and improved alignment hardware intact without adverse features ? ?Assessment/Plan: ?1 Day Post-Op  ? ?Principal Problem: ?  Closed fracture of left hip ([place]) ?Active Problems: ?  Depression ?  HTN (hypertension) ?  Swallowing dysfunction ?  Chronic obstructive pulmonary disease (HCC) ?  Pulmonary infiltrates ?  Pressure injury of skin ?  CKD (chronic kidney disease), stage II ?  Normocytic anemia ?  Anxiety ?  Hip fracture ([place]) ? ?S/p revision THA for periprosthetic femur fracture 4/14 ?Status post CMN left intertrochanteric femur fracture 5/10 ? ?Post op recs: ?WB: WBAT LLE, : 20% PWB RLE, posterior hip precautions x6 weeks, AFO for R foot drop, please keep heel off of bed ?Abx: on abx for ongoing  pneumonia ?Imaging: PACU xrays ?Dressing: keep intact until follow up, change PRN if soiled or saturated. ?DVT prophylaxis: lovenox starting POD1 x4 weeks ?Follow up: 2 weeks after surgery for a wound check with Dr. Zachery Dakins at [facility].  ?Address: [address] [place], [place], [place] [address]  ?Office Phone: [phone] ? ? ?Nasha Diss A Powell Halbert ?06/17/2021, 6:46 AM ? ? ?Charlies Constable, MD ? ?Contact information:   ?Weekdays 7am-5pm epic message Dr. Zachery Dakins, or call office for patient follow up: [phone] ?After hours and holidays please check [email or web address] for group call information for [facility] ? ?  ?

## 2021-06-17 NOTE — Assessment & Plan Note (Signed)
Stable  Continue home medication  

## 2021-06-17 NOTE — TOC Initial Note (Signed)
Transition of Care (TOC) - Initial/Assessment Note  ? ? ?Patient Details  ?Name: William Kirk ?MRN: [id number] ?Date of Birth: [birth year] ? ?Transition of Care [facility]) CM/SW Contact:    ?Golda Acre, RN ?Phone Number: ?06/17/2021, 8:31 AM ? ?Clinical Narrative:                 ?Levada Dy pt is not managed through [facility].   [facility] to get auth for return. ? ?Expected Discharge Plan: Skilled Nursing Facility [facility]) ?Barriers to Discharge: No Barriers Identified ? ? ?Patient Goals and CMS Choice ?Patient states their goals for this hospitalization and ongoing recovery are:: go back to [facility] ?CMS Medicare.gov Compare Post Acute Care list provided to:: Patient Represenative (must comment) (spouse) ?Choice offered to / list presented to : Spouse ? ?Expected Discharge Plan and Services ?Expected Discharge Plan: Skilled Nursing Facility [facility]) ?  ?Discharge Planning Services: CM Consult ?Post Acute Care Choice: Skilled Nursing Facility ?Living arrangements for the past 2 months: Skilled Nursing Facility ?                ?  ?  ?  ?  ?  ?  ?  ?  ?  ?  ? ?Prior Living Arrangements/Services ?Living arrangements for the past 2 months: Skilled Nursing Facility ?  ?Patient language and need for interpreter reviewed:: Yes ?Do you feel safe going back to the place where you live?: Yes (wife)      ?Need for Family Participation in Patient Care: Yes (Comment) ?Care giver support system in place?: Yes (comment) ?  ?Criminal Activity/Legal Involvement Pertinent to Current Situation/Hospitalization: No - Comment as needed ? ?Activities of Daily Living ?Home Assistive Devices/Equipment: Dan Humphreys (specify type) ?ADL Screening (condition at time of admission) ?Patient's cognitive ability adequate to safely complete daily activities?: No ?Is the patient deaf or have difficulty hearing?: Yes ?Does the patient have difficulty seeing, even when wearing glasses/contacts?: No ?Does the patient have  difficulty concentrating, remembering, or making decisions?: Yes ?Patient able to express need for assistance with ADLs?: Yes ?Does the patient have difficulty dressing or bathing?: Yes ?Independently performs ADLs?: No ?Communication: Independent ?Dressing (OT): Needs assistance ?Is this a change from baseline?: Pre-admission baseline ?Grooming: Needs assistance ?Is this a change from baseline?: Change from baseline, expected to last <3 days ?Bathing: Needs assistance ?Is this a change from baseline?: Pre-admission baseline ?Toileting: Needs assistance ?Is this a change from baseline?: Pre-admission baseline ?In/Out Bed: Dependent ?Is this a change from baseline?: Change from baseline, expected to last <3 days ?Walks in Home: Independent ?Does the patient have difficulty walking or climbing stairs?: Yes ?Weakness of Legs: Both ?Weakness of Arms/Hands: Both ? ?Permission Sought/Granted ?  ?  ?   ?   ?   ?   ? ?Emotional Assessment ?Appearance:: Appears stated age ?Attitude/Demeanor/Rapport: Gracious ?Affect (typically observed): Accepting ?Orientation: : Oriented to Self, Oriented to Situation ?Alcohol / Substance Use: Not Applicable ?Psych Involvement: No (comment) ? ?Admission diagnosis:  Closed fracture of left hip, initial encounter (HCC) [S72.002A] ?Closed left hip fracture, initial encounter (HCC) [S72.002A] ?Hip fracture (HCC) [S72.009A] ?Patient Active Problem List  ? Diagnosis Date Noted  ? Closed fracture of left hip (HCC) 06/16/2021  ? Anxiety 06/16/2021  ? Hip fracture (HCC) 06/16/2021  ? Normocytic anemia 06/10/2021  ? Grade I diastolic dysfunction 06/10/2021  ? Acute on chronic respiratory failure with hypoxia and hypercapnia (HCC) 05/21/2021  ? Respiratory acidosis 05/21/2021  ? Dehydration 05/20/2021  ?  Leukocytosis 05/20/2021  ? 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 Right femoral fracture (HCC) 05/19/2021  ? Asymptomatic carotid artery stenosis  without infarction, right 02/24/2021  ? Abnormality of gait 04/16/2020  ? Muscle cramp 04/16/2020  ? 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 Pressure injury of skin 11/06/2018  ? 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 Hemoptysis 07/07/2018  ? AKI (acute kidney injury) (HCC) 07/07/2018  ? Acute respiratory failure (HCC) 07/07/2018  ? Chronic obstructive pulmonary disease (HCC) 11/07/2016  ? Chronic respiratory failure with hypoxia (HCC) 11/07/2016  ? Pulmonary infiltrates 11/07/2016  ? Swallowing dysfunction 09/26/2016  ? Depression 09/21/2016  ? Aspiration pneumonia (HCC) 09/21/2016  ? HTN (hypertension) 09/21/2016  ? HCAP (healthcare-associated pneumonia)   ? COPD with acute exacerbation (HCC) 06/21/2016  ? Acute respiratory failure with hypoxia (HCC) 06/21/2016  ? Hypokalemia 06/21/2016  ? Hyponatremia 06/21/2016  ? PVD (peripheral vascular disease) (HCC) 06/21/2016  ? Bronchiectasis (HCC) 06/21/2016  ? Protein-calorie malnutrition, severe (HCC) 06/21/2016  ? Multifocal pneumonia 06/20/2016  ? FUO (fever of unknown origin) 04/15/2015  ? 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 Other tear of medial meniscus, current injury, unspecified knee, initial encounter 12/03/2012  ? ?PCP:  Floreen Comber, PA-C ?Pharmacy:   ?[facility] - [place], [place] - [address] ?[phone] ?[place] [address] ?Phone: [phone] Fax: [phone] ? ? ? ? ?Social Determinants of Health (SDOH) Interventions ?  ? ?Readmission Risk Interventions ? ?  02/25/2021  ?  2:25 PM 11/08/2018  ? 11:19 AM 10/23/2018  ?  8:44 AM  ?Readmission Risk Prevention Plan  ?Post Dischage Appt Complete    ?Medication Screening Complete    ?Transportation Screening Complete Complete Complete  ?PCP or Specialist Appt within 3-5 Days   Complete Complete  ?[place] or Home Care Consult  Complete Complete  ?Social Work Consult for Recovery Care Planning/Counseling  Complete Complete  ?Palliative Care Screening  Not Applicable Not Applicable  ?Medication Review Oceanographer)  Complete Complete  ? ? ? ?

## 2021-06-17 NOTE — Evaluation (Signed)
Physical Therapy Evaluation ?Patient Details ?Name: William Kirk ?MRN: [id number] ?DOB: [birth year] ?Today's Date: 06/17/2021 ? ?History of Present Illness ? 81 y.o. male with medical history significant for COPD, dysphagia, hypertension, PAD, depression, anxiety, CKD stage II, and ORIF of periprosthetic right femur fracture on 05/21/2021 (posterior precautions, 20% PWB) admitted with a fall at his SNF, Dx of L intertrochanteric fx of femur, s/p IM nail 06/16/21, WBAT LLE.  ?Clinical Impression ? Pt admitted with above diagnosis. Min to mod assist for bed mobility. Pt dangled at edge of bed for ~8 minutes. BP supine 99/50, BP sitting 83/55. RN reported new critical value for Hgb of 6.4. Did not attempt standing due to hypotension. Pt is pleasantly confused, he is oriented to himself, stated he's at "[facility]". He's not able to recall his posterior hip precautions for R hip. SNF recommended.  Pt currently with functional limitations due to the deficits listed below (see PT Problem List). Pt will benefit from skilled PT to increase their independence and safety with mobility to allow discharge to the venue listed below.   ?   ?   ? ?Recommendations for follow up therapy are one component of a multi-disciplinary discharge planning process, led by the attending physician.  Recommendations may be updated based on patient status, additional functional criteria and insurance authorization. ? ?Follow Up Recommendations Skilled nursing-short term rehab (<3 hours/day) ? ?  ?Assistance Recommended at Discharge Frequent or constant Supervision/Assistance  ?Patient can return home with the following ? A lot of help with walking and/or transfers;A lot of help with bathing/dressing/bathroom;Direct supervision/assist for financial management;Direct supervision/assist for medications management;Assistance with cooking/housework;Help with stairs or ramp for entrance ? ?  ?Equipment Recommendations None recommended by PT   ?Recommendations for Other Services ?    ?  ?Functional Status Assessment Patient has had a recent decline in their functional status and demonstrates the ability to make significant improvements in function in a reasonable and predictable amount of time.  ? ?  ?Precautions / Restrictions Precautions ?Precautions: Posterior Hip;Fall ?Precaution Booklet Issued: Yes (comment) ?Precaution Comments: pt cannot verbalize precautions for R hip ?Required Braces or Orthoses: Splint/Cast ?Splint/Cast: AFO R foot ?Restrictions ?Weight Bearing Restrictions: Yes ?RLE Weight Bearing: Partial weight bearing ?RLE Partial Weight Bearing Percentage or Pounds: 20 ?LLE Weight Bearing: Weight bearing as tolerated  ? ?  ? ?Mobility ? Bed Mobility ?Overal bed mobility: Needs Assistance ?Bed Mobility: Supine to Sit, Sit to Supine ?Rolling: Mod assist ?  ?Supine to sit: Min assist ?  ?  ?General bed mobility comments: Mod A to raise trunk, min A for LEs into bed. RN reported new critical value of Hgb 6.4, BP supine 99/50, sitting 83/55 with "a little" dizziness reported. Did not attempt standing due to hypotension. ?  ? ?Transfers ?  ?  ?  ?  ?  ?  ?  ?  ?  ?  ?  ? ?Ambulation/Gait ?  ?  ?  ?  ?  ?  ?  ?  ? ?Stairs ?  ?  ?  ?  ?  ? ?Wheelchair Mobility ?  ? ?Modified Rankin (Stroke Patients Only) ?  ? ?  ? ?Balance Overall balance assessment: History of Falls, Needs assistance ?Sitting-balance support: No upper extremity supported, Feet supported ?Sitting balance-Leahy Scale: Fair ?  ?  ?  ?  ?  ?  ?  ?  ?  ?  ?  ?  ?  ?  ?  ?  ?   ? ? ? ?  Pertinent Vitals/Pain Pain Assessment ?Faces Pain Scale: Hurts little more ?Pain Location: L hip with activity ?Pain Descriptors / Indicators: Grimacing  ? ? ?Home Living Family/patient expects to be discharged to:: Skilled nursing facility ?  ?  ?  ?  ?  ?  ?  ?  ?  ?   ?  ?Prior Function   ?  ?  ?  ?  ?  ?  ?Mobility Comments: walking independently prior to fx in April, at [place] since d/c from  hospital ?  ?  ? ? ?Hand Dominance  ?   ? ?  ?Extremity/Trunk Assessment  ? Upper Extremity Assessment ?Upper Extremity Assessment: Overall WFL for tasks assessed ?  ? ?Lower Extremity Assessment ?Lower Extremity Assessment: LLE deficits/detail ?RLE Deficits / Details: knee ext +4/5, hip flexion AAROM to 45* without pain, h/o of R foot drop (ankle in AFO, did not re-assess DF/PF) ?RLE Sensation: WNL ?LLE Deficits / Details: knee ext AROM -20* ?LLE Sensation: WNL ?  ? ?Cervical / Trunk Assessment ?Cervical / Trunk Assessment: Kyphotic  ?Communication  ? Communication: No difficulties  ?Cognition Arousal/Alertness: Awake/alert ?Behavior During Therapy: [facility] for tasks assessed/performed ?Overall Cognitive Status: No family/caregiver present to determine baseline cognitive functioning ?  ?  ?  ?  ?  ?  ?  ?  ?  ?  ?  ?  ?  ?  ?  ?  ?General Comments: pt unable to recall precautions or WB status, pleasant affect, oriented to self, stated he's 81 years old and that he's in "[facility]" ?  ?  ? ?  ?General Comments   ? ?  ?Exercises Total Joint Exercises ?Ankle Circles/Pumps: AROM, Left, 5 reps, Supine ?Heel Slides: AAROM, 5 reps, Both  ? ?Assessment/Plan  ?  ?PT Assessment Patient needs continued PT services  ?PT Problem List Decreased range of motion;Decreased strength;Decreased balance;Decreased activity tolerance;Decreased mobility;Decreased cognition;Decreased knowledge of use of DME;Decreased safety awareness;Decreased knowledge of precautions;Pain ? ?   ?  ?PT Treatment Interventions     ? ?PT Goals (Current goals can be found in the Care Plan section)  ?Acute Rehab PT Goals ?Patient Stated Goal: agrees he wants to walk again ?PT Goal Formulation: With patient ?Time For Goal Achievement: 07/01/21 ?Potential to Achieve Goals: Good ? ?  ?Frequency Min 4X/week ?  ? ? ?Co-evaluation   ?  ?  ?  ?  ? ? ?  ?AM-PAC PT "6 Clicks" Mobility  ?Outcome Measure Help needed turning from your back to your side while in a flat bed  without using bedrails?: A Little ?Help needed moving from lying on your back to sitting on the side of a flat bed without using bedrails?: A Lot ?Help needed moving to and from a bed to a chair (including a wheelchair)?: Total ?Help needed standing up from a chair using your arms (e.g., wheelchair or bedside chair)?: Total ?Help needed to walk in hospital room?: Total ?Help needed climbing 3-5 steps with a railing? : Total ?6 Click Score: 9 ? ?  ?End of Session   ?Activity Tolerance: Patient limited by fatigue;Treatment limited secondary to medical complications (Comment) (hypotension, anemia) ?Patient left: in bed;with bed alarm set;with call bell/phone within reach ?Nurse Communication: Mobility status ?PT Visit Diagnosis: Other abnormalities of gait and mobility (R26.89);History of falling (Z91.81) ?  ? ?Time: 8119-1478 ?PT Time Calculation (min) (ACUTE ONLY): 26 min ? ? ?Charges:   PT Evaluation ?$PT Eval Moderate Complexity: 1 Mod ?  ?  ?   ? ?  Ralene Bathe Kistler PT 06/17/2021  ?Acute Rehabilitation Services ?Pager [phone] ?Office [phone] ? ? ?

## 2021-06-17 NOTE — Assessment & Plan Note (Addendum)
Nutrition Status: ?Nutrition Problem: Severe Malnutrition ?Etiology: chronic illness ?Signs/Symptoms: severe fat depletion, severe muscle depletion ?Interventions: Tube feeding, Prostat, Juven ?

## 2021-06-17 NOTE — Assessment & Plan Note (Addendum)
S/p recent right periprosthetic femoral fracture repair on 05/21/2021.  Stable. ?-20% PWB RLE, posterior hip precautions x6 weeks,  ?-AFO for R foot drop, please keep heel off of bed ?

## 2021-06-17 NOTE — Assessment & Plan Note (Addendum)
X-ray showed intertrochanteric left femoral bone fracture. ?-S/p intramedullary nailing by Dr. Zachery Dakins on 5/10. ?-WBAT as tolerated on LLE ?-20% PWB RLE, posterior hip precautions x6 weeks,  ?-AFO for R foot drop, please keep heel off of bed ?-Subcu Lovenox for VTE prophylaxis for 4-week ?-Outpatient follow-up in 2 weeks for wound check ?-Pain control per orthopedic surgery. ?-PT/OT ?

## 2021-06-17 NOTE — Assessment & Plan Note (Addendum)
Recent Labs  ?  06/01/21 ?0231 06/10/21 ?0406 06/11/21 ?0456 06/16/21 ?0326 06/17/21 ?0501 06/17/21 ?1140 06/18/21 ?0431 06/18/21 ?2113 06/19/21 ?1517 06/20/21 ?6160  ?HGB 8.6* 8.9* 7.9* 9.1* 7.1* 6.4* 7.1* 8.2* 9.0* 8.9*  ?Some drop in Hgb postop.  No obvious bleeding at surgical site or from GI.  Anemia panel with some degree of iron deficiency.  Transfused 2 units with appropriate response.  Received IV ferric gluconate 250 mg on 5/10.  HH stable ?-Continue ferrous sulfate ?-Recheck CBC in about a week ?

## 2021-06-17 NOTE — Op Note (Signed)
DATE OF SURGERY:  06/16/2021 ?  ?TIME: 5:47 PM ?  ?PATIENT NAME:  William Kirk ?  ?AGE: 81 y.o. ?  ?PRE-OPERATIVE DIAGNOSIS:  LEFT STABLE INTERTROCHENTERIC FRACTURE ?  ?POST-OPERATIVE DIAGNOSIS:  SAME ?  ?PROCEDURE:  INTRAMEDULLARY (IM) NAIL INTERTROCHANTRIC ?  ?SURGEON:  Cassy Sprowl A Brighten Buzzelli ?  ?ASSISTANT: Izola Price, RNFA was present and scrubbed throughout the case, critical for assistance with exposure, retraction, instrumentation, and closure. ?  ?OPERATIVE IMPLANTS:  ?[employer] Intertan Nail 10 x 180 mm , 100/95 lag compression screw, 32m distal interlock ?  ?Implant Name Type Inv. Item Serial No. Manufacturer Lot No. LRB No. Used Action  ?NAIL INTERTAN 10X18 130D 10S - [id number] NAIL INTERTAN 10X18 130D 10S   [employer] [id number] 1 Implanted  ?SCREW LAG COMPR KIT 100/95 - [id number] SCREW LAG COMPR KIT 100/95   [facility] [employer] [id number] 1 Implanted  ?SCREW TRIGEN LOW PROF 5.0X40 - [id number] SCREW TRIGEN LOW PROF 5.0X40   [employer] [id number] 1 Implanted  ?  ?ESTIMATED BLOOD LOSS: 100cc ?  ?PREOPERATIVE INDICATIONS:  William MCCORKELis a 81y.o. year old who fell and suffered an LEFT INTERTROCHENTERIC FRACTURE. He was brought into the ER and then admitted and optimized and then elected for surgical intervention.   ?  ?The risks benefits and alternatives were discussed with the patient including but not limited to the risks of nonoperative treatment, versus surgical intervention including infection, bleeding, nerve injury, malunion, nonunion, hardware prominence, hardware failure, need for hardware removal, blood clots, cardiopulmonary complications, morbidity, mortality, among others, and they were willing to proceed.   ?  ?OPERATIVE PROCEDURE: ?  ?The patient was brought to the operating room and placed in the supine position. Anesthesia was administered. He was placed on the fracture table.  Closed reduction was  performed under C-arm guidance.  Time out was then performed after sterile prep and drape. He received preoperative antibiotics. ? ?Small incision proximal to the greater trochanter was made and carried down through skin and subcutaneous tissue.  Threaded guidewire was directed at the tip of the greater trochanter and advanced into the proximal metaphysis.  Positioning was confirmed with fluoroscopy.  I then used an entry reamer to enter the medullary canal.  I then passed a 10 x 180 mm InterTAN down the center of the canal attached to the targeting arm.  I then used the targeting arm to make a percutaneous incision and directed a threaded guidewire up into the head/neck segment.  I confirmed adequate tip apex distance and measured the length.  I decided to place a 100 mm screw.  I then drilled the path for the compression screw and placed an antirotation bar.  I then placed the lag screw and then placed the compression screw and compressed approximately 5 mm.  The proximal portion of the nail was statically locked. ?  ?I used the targeting arm to place a distal interlocking screw.  The targeting arm was removed.  Final fluoroscopic imaging was obtained.   ?  ?The wounds were irrigated copiously, and vancomycin powder was placed in the wounds.  The gluteal fascia was closed with 0 Vicryl, and skin was closed with 2-0 Vicryl and 3-0 Monocryl.  Sterile dressing was applied with Dermabond, 4 x 4 gauze, and Tegaderm.  The patient was awakened and returned to PACU in stable and satisfactory condition. There were no complications and the patient tolerated the  procedure well. ?  ?  ?Post op recs: ?WB: WBAT LLE ?Abx: ancef x23 hours post op ?Imaging: PACU xrays ?Dressing: keep intact until follow up, change PRN if soiled or saturated. ?DVT prophylaxis: lovenox starting POD1 x4 weeks ?Follow up: 2 weeks after surgery for a wound check with Dr. Zachery Dakins at [facility]  ?Address: [address] [place],  [place], [place] [address]  ?Office Phone: [phone] ?  ?Charlies Constable, MD ?Orthopaedic Surgery  ?

## 2021-06-17 NOTE — Assessment & Plan Note (Addendum)
Slightly hypotensive. ?-Continue home midodrine. ?

## 2021-06-18 ENCOUNTER — Encounter (HOSPITAL_COMMUNITY): Payer: Self-pay | Admitting: Orthopedic Surgery

## 2021-06-18 DIAGNOSIS — J438 Other emphysema: Secondary | ICD-10-CM | POA: Diagnosis not present

## 2021-06-18 DIAGNOSIS — R4189 Other symptoms and signs involving cognitive functions and awareness: Secondary | ICD-10-CM | POA: Diagnosis not present

## 2021-06-18 DIAGNOSIS — S72002A Fracture of unspecified part of neck of left femur, initial encounter for closed fracture: Secondary | ICD-10-CM | POA: Diagnosis not present

## 2021-06-18 DIAGNOSIS — F419 Anxiety disorder, unspecified: Secondary | ICD-10-CM | POA: Diagnosis not present

## 2021-06-18 LAB — GLUCOSE, CAPILLARY
Glucose-Capillary: 106 mg/dL — ABNORMAL HIGH (ref 70–99)
Glucose-Capillary: 107 mg/dL — ABNORMAL HIGH (ref 70–99)
Glucose-Capillary: 124 mg/dL — ABNORMAL HIGH (ref 70–99)
Glucose-Capillary: 125 mg/dL — ABNORMAL HIGH (ref 70–99)
Glucose-Capillary: 130 mg/dL — ABNORMAL HIGH (ref 70–99)
Glucose-Capillary: 158 mg/dL — ABNORMAL HIGH (ref 70–99)
Glucose-Capillary: 98 mg/dL (ref 70–99)

## 2021-06-18 LAB — HEMOGLOBIN AND HEMATOCRIT, BLOOD
HCT: 25.9 % — ABNORMAL LOW (ref 39.0–52.0)
Hemoglobin: 8.2 g/dL — ABNORMAL LOW (ref 13.0–17.0)

## 2021-06-18 LAB — RENAL FUNCTION PANEL
Albumin: 2.3 g/dL — ABNORMAL LOW (ref 3.5–5.0)
Anion gap: 6 (ref 5–15)
BUN: 41 mg/dL — ABNORMAL HIGH (ref 8–23)
CO2: 26 mmol/L (ref 22–32)
Calcium: 7.8 mg/dL — ABNORMAL LOW (ref 8.9–10.3)
Chloride: 105 mmol/L (ref 98–111)
Creatinine, Ser: 0.99 mg/dL (ref 0.61–1.24)
GFR, Estimated: >60 mL/min (ref >60)
Glucose, Bld: 109 mg/dL — ABNORMAL HIGH (ref 70–99)
Phosphorus: 2.2 mg/dL — ABNORMAL LOW (ref 2.5–4.6)
Potassium: 3.8 mmol/L (ref 3.5–5.1)
Sodium: 137 mmol/L (ref 135–145)

## 2021-06-18 LAB — MAGNESIUM: Magnesium: 2.6 mg/dL — ABNORMAL HIGH (ref 1.7–2.4)

## 2021-06-18 LAB — CBC
HCT: 22.5 % — ABNORMAL LOW (ref 39.0–52.0)
Hemoglobin: 7.1 g/dL — ABNORMAL LOW (ref 13.0–17.0)
MCH: 27.8 pg (ref 26.0–34.0)
MCHC: 31.6 g/dL (ref 30.0–36.0)
MCV: 88.2 fL (ref 80.0–100.0)
Platelets: 279 10*3/uL (ref 150–400)
RBC: 2.55 MIL/uL — ABNORMAL LOW (ref 4.22–5.81)
RDW: 15.6 % — ABNORMAL HIGH (ref 11.5–15.5)
WBC: 12.4 10*3/uL — ABNORMAL HIGH (ref 4.0–10.5)
nRBC: 0 % (ref 0.0–0.2)

## 2021-06-18 LAB — PREPARE RBC (CROSSMATCH)

## 2021-06-18 MED ORDER — LORAZEPAM 0.5 MG PO TABS
0.5000 mg | ORAL_TABLET | Freq: Once | ORAL | Status: AC
  Administered 2021-06-18: 0.5 mg via ORAL
  Filled 2021-06-18: qty 1

## 2021-06-18 MED ORDER — SODIUM CHLORIDE 0.9% IV SOLUTION: Freq: Once | INTRAVENOUS | Status: AC

## 2021-06-18 MED ORDER — SODIUM CHLORIDE 0.9 % IV SOLN
250.0000 mg | Freq: Once | INTRAVENOUS | Status: AC
  Administered 2021-06-18: 250 mg via INTRAVENOUS
  Filled 2021-06-18: qty 20

## 2021-06-18 NOTE — Consult Note (Signed)
[facility] CM Inpatient Consult ? ? ?06/18/2021 ? ?Gaynelle Arabian Sakurai ?[birth year] ?[id number] ? ?[facility] CM) ?  ?Patient chart has been reviewed with noted high risk score for unplanned readmissions and unplanned 30 day readmission.  Patient assessed for community [place] Management follow up needs. Per review, patient has [place] services for primary provider care. No [facility] CM as provider outside of physician network. ?  ?Of note, [facility] Care Management services does not replace or interfere with any services that are arranged by inpatient case management or social work.  ?  ?Netta Cedars, MSN, RN ?[facility] Liaison ?Phone [phone] ?Toll free office [phone]   ? ?

## 2021-06-18 NOTE — Progress Notes (Signed)
?   06/18/21 1119  ?Assess: MEWS Score  ?Temp 98.3 ?F (36.8 ?C)  ?BP 95/67  ?Pulse Rate 72  ?Resp (!) 24  ?SpO2 96 %  ?O2 Device Nasal Cannula  ?O2 Flow Rate (L/min) 2 L/min  ?Assess: MEWS Score  ?MEWS Temp 0  ?MEWS Systolic 1  ?MEWS Pulse 0  ?MEWS RR 1  ?MEWS LOC 0  ?MEWS Score 2  ?MEWS Score Color Yellow  ?Assess: if the MEWS score is Yellow or Red  ?Were vital signs taken at a resting state? Yes  ?Focused Assessment No change from prior assessment  ?Does the patient meet 2 or more of the SIRS criteria? No  ?MEWS guidelines implemented *See Row Information* Yes  ?Treat  ?MEWS Interventions Escalated (See documentation below)  ?Pain Scale 0-10  ?Pain Score 0  ?Take Vital Signs  ?Increase Vital Sign Frequency  Yellow: Q 2hr X 2 then Q 4hr X 2, if remains yellow, continue Q 4hrs  ?Escalate  ?MEWS: Escalate Yellow: discuss with charge nurse/RN and consider discussing with provider and RRT  ?Notify: Charge Nurse/RN  ?Name of Charge Nurse/RN Notified Chancy Hurter  ?Date Charge Nurse/RN Notified 06/18/21  ?Time Charge Nurse/RN Notified 1129  ?Notify: Provider  ?Provider Name/Title Cyndia Skeeters  ?Date Provider Notified 06/18/21  ?Time Provider Notified 1125  ?Method of Notification Page  ?Notification Reason Other (Comment) ?(Post iron infusion)  ?Provider response No new orders  ?Date of Provider Response 06/18/21  ?Time of Provider Response 1127  ?Assess: SIRS CRITERIA  ?SIRS Temperature  0  ?SIRS Pulse 0  ?SIRS Respirations  1  ?SIRS WBC 0  ?SIRS Score Sum  1  ? ? ?

## 2021-06-18 NOTE — TOC Progression Note (Signed)
Transition of Care (TOC) - Progression Note  ? ? ?Patient Details  ?Name: William Kirk ?MRN: [id number] ?Date of Birth: 23-Jul-1940 ? ?Transition of Care (TOC) CM/SW Contact  ?Leeroy Cha, RN ?Phone Number: ?06/18/2021, 12:09 PM ? ?Clinical Narrative:    ?Patient fl2 updated and sent to [facility], end for fl2 in system is [phone].  AUTH IS NOT HANDLED THROUGH [facility] will need to get auth. ? ? ?Expected Discharge Plan: [place] ([place]) ?Barriers to Discharge: No Barriers Identified ? ?Expected Discharge Plan and Services ?Expected Discharge Plan: [place] ([place]) ?  ?Discharge Planning Services: CM Consult ?Post Acute Care Choice: [place] ?Living arrangements for the past 2 months: [place] ?                ?  ?  ?  ?  ?  ?  ?  ?  ?  ?  ? ? ?Social Determinants of Health (SDOH) Interventions ?  ? ?Readmission Risk Interventions ? ?  02/25/2021  ?  2:25 PM 11/08/2018  ? 11:19 AM 10/23/2018  ?  8:44 AM  ?Readmission Risk Prevention Plan  ?Post Dischage Appt Complete    ?Medication Screening Complete    ?Transportation Screening Complete Complete Complete  ?PCP or Specialist Appt within 3-5 Days  Complete Complete  ?[place] or Home Care Consult  Complete Complete  ?Social Work Consult for [place] Planning/Counseling  Complete Complete  ?Palliative Care Screening  Not Applicable Not Applicable  ?Medication Review Press photographer)  Complete Complete  ? ? ?

## 2021-06-18 NOTE — Progress Notes (Signed)
? ? ? ?  Subjective: ? ?Patient doing fine this morning.  Pain well controlled.  Patient remains confused at baseline.  Worked with physical therapy yesterday.  No new issues.  Denies any pain in the right hip.  Postop left hip pain well controlled. ? ?Objective:  ? ?VITALS:   ?Vitals:  ? 06/17/21 1839 06/17/21 2046 06/18/21 0500 06/18/21 0621  ?BP: (!) 111/47 (!) 102/45  (!) 117/51  ?Pulse: 85 87  79  ?Resp: '18 18  16  '$ ?Temp: 98.3 ?F (36.8 ?C) 98.2 ?F (36.8 ?C)  98.5 ?F (36.9 ?C)  ?TempSrc: Oral Oral  Oral  ?SpO2: 90% 95%  92%  ?Weight:   79.4 kg   ?Height:      ? ?LLE ?Intact pulses distally ?Dorsiflexion/Plantar flexion intact ?Incision: dressing C/D/I ?Compartment soft ? ?RLE ?Intact pulses distally ?Plantar flexion intact, no active dorsiflexion ?Incision: dressing C/D/I ?Compartment soft ? ?Lab Results  ?Component Value Date  ? WBC 12.4 (H) 06/18/2021  ? HGB 7.1 (L) 06/18/2021  ? HCT 22.5 (L) 06/18/2021  ? MCV 88.2 06/18/2021  ? PLT 279 06/18/2021  ? ?BMET ?   ?Component Value Date/Time  ? NA 137 06/18/2021 0431  ? K 3.8 06/18/2021 0431  ? CL 105 06/18/2021 0431  ? CO2 26 06/18/2021 0431  ? GLUCOSE 109 (H) 06/18/2021 0431  ? BUN 41 (H) 06/18/2021 0431  ? CREATININE 0.99 06/18/2021 0431  ? CALCIUM 7.8 (L) 06/18/2021 0431  ? GFRNONAA >60 06/18/2021 0431  ? ? ?Xray: Postop x-rays demonstrate left intertrochanteric femur fracture and improved alignment hardware intact without adverse features ? ?Assessment/Plan: ?2 Days Post-Op  ? ?Principal Problem: ?  Closed fracture of left hip likely due to unwitnessed fall at nursing home ?Active Problems: ?  H/O total right hip arthroplasty ?  PVD and carotid artery stenosis s/p right transcarotid artery revascularization ?  Protein-calorie malnutrition, severe ([place]) ?  Anxiety and depression ?  HTN (hypertension) ?  Dysphagia ?  Chronic obstructive pulmonary disease (HCC) ?  Pulmonary infiltrates ?  Pressure injury of skin ?  Iron deficiency anemia ?  Goals of care,  counseling/discussion ?  Fall at nursing home ?  Cognitive impairment ? ?S/p revision THA for periprosthetic femur fracture 4/14 ?Status post CMN left intertrochanteric femur fracture 5/10 ? ?Post op recs: ?WB: WBAT LLE, : 20% PWB RLE, posterior hip precautions x6 weeks, AFO for R foot drop, please keep heel off of bed ?Abx: on abx for ongoing pneumonia ?Imaging: PACU xrays ?Dressing: keep intact until follow up, change PRN if soiled or saturated. ?DVT prophylaxis: lovenox starting POD1 x4 weeks ?Follow up: 2 weeks after surgery for a wound check with Dr. Zachery Dakins at [facility].  ?Address: [address] [place], [place], [place] [address]  ?Office Phone: [phone] ? ? ?Modesty Rudy A Tareva Leske ?06/18/2021, 7:04 AM ? ? ?Charlies Constable, MD ? ?Contact information:   ?Weekdays 7am-5pm epic message Dr. Zachery Dakins, or call office for patient follow up: [phone] ?After hours and holidays please check [email or web address] for group call information for [facility] ? ?  ?

## 2021-06-18 NOTE — Evaluation (Addendum)
SLP Cancellation Note ? ?Patient Details ?Name: William Kirk ?MRN: [id number] ?DOB: [birth year] ? ? ?Cancelled treatment:       Reason Eval/Treat Not Completed: Other (comment) (SLP spoke to MD regarding pt, pt underwent MBS early April 2023 showing a severe dysphagia with recommendation for follow up at [facility]. Given current illness, his chronic dysphagia will not be improved.  Obtained permission to cancel order.) ?Kathleen Lime, MS CCC SLP ?Acute Rehab Services ?Office [phone] ?Pager [phone] ? ? ?[name] ?06/18/2021, 11:02 AM ? ? ? ?

## 2021-06-18 NOTE — Progress Notes (Signed)
?PROGRESS NOTE ? ?William Kirk DGL:[id number] DOB: April 29, 1940  ? ?PCP: Floreen Comber, PA-C ? ?Patient is from: SNF ? ?DOA: 06/16/2021 LOS: 2 ? ?Chief complaints ?Chief Complaint  ?Patient presents with  ? Fall  ?  ? ?Brief Narrative / Interim history: ?81 year old M with PMH of cognitive impairment, COPD, chronic dysphagia/aspiration on TF via PEG tube, anemia, PAD/carotid artery stenosis s/p right transcarotid artery revascularization in 02/2021, anxiety, depression, and ORIF of periprosthetic right femoral fracture on 05/21/2021 presenting with left hip pain after unwitnessed fall at [facility].  CT head and cervical spine without acute finding.  X-ray showed intertrochanteric left femoral fracture.  ? ?Patient underwent intramedullary nailing on 06/16/2021.  Postop, Hgb dropped to 6.4 (baseline 7-8).  Plavix held.  Transfused 2 units so far.  Received IV iron.  ? ?Subjective: ?Seen and examined earlier this morning.  No major events overnight of this morning.  No complaints but not a great historian.  He is awake and alert but only oriented to self and place but not time.  Follows commands.  Does not appear to be in distress.  He responds no to pain or shortness of breath. ? ?Objective: ?Vitals:  ? 06/18/21 1119 06/18/21 1151 06/18/21 1151 06/18/21 1225  ?BP: 95/67 (!) 108/52 (!) 108/52 (!) 101/52  ?Pulse: 72 73 73 74  ?Resp: (!) 24 (!) 22 (!) 22 (!) 24  ?Temp: 98.3 ?F (36.8 ?C) 97.7 ?F (36.5 ?C) 97.7 ?F (36.5 ?C) (!) 97.3 ?F (36.3 ?C)  ?TempSrc: Oral Oral  Oral  ?SpO2: 96%  96% 96%  ?Weight:      ?Height:      ? ? ?Examination: ? ?GENERAL: Appears frail.  Nontoxic. ?HEENT: MMM.  Vision and hearing grossly intact.  ?NECK: Supple.  No apparent JVD.  ?RESP: 96% on RA.  No IWOB.  Fair aeration bilaterally. ?CVS:  RRR. Heart sounds normal.  ?ABD/GI/GU: BS+. Abd soft, NTND.  ?MSK/EXT:  Moves extremities.  Splint over right foot. ?SKIN: Dressing over left thigh DCI.  No ecchymosis or hematoma. ?NEURO: Awake and alert.  Oriented to self and place.  Follows commands.  No apparent focal neuro deficit. ?PSYCH: Calm. Normal affect.  ? ?Procedures:  ?5/10-intramedullary nailing of left intertrochanteric femoral fracture ? ?Microbiology summarized: ?None ? ?Assessment and Plan: ?* Closed fracture of left hip likely due to unwitnessed fall at nursing home ?X-ray showed intertrochanteric left femoral bone fracture. ?-S/p intramedullary nailing by Dr. Blanchie Dessert on 5/10. ?-WBAT as tolerated on LLE ?-Subcu Lovenox for VTE prophylaxis for 4-week-holding Lovenox and using SCD until H&H stable. ?-Outpatient follow-up in 2 weeks for wound check ?-Pain control per orthopedic surgery. ?-PT/OT ? ?Iron deficiency anemia ?Recent Labs  ?  05/29/21 ?0808 05/30/21 ?0135 05/31/21 ?5188 06/01/21 ?0231 06/10/21 ?0406 06/11/21 ?4166 06/16/21 ?0630 06/17/21 ?0501 06/17/21 ?1140 06/18/21 ?0431  ?HGB 7.0* 7.3* 7.1* 8.6* 8.9* 7.9* 9.1* 7.1* 6.4* 7.1*  ?Some drop in Hgb postop.  No obvious bleeding at surgical site or from GI.  Anemia panel with some degree of iron deficiency.  Transfused 1 unit with minimal response. ?-Transfuse additional 1 unit.  ?-IV iron on 5/12 ?-Continue holding Plavix, aspirin and subcu Lovenox ?-FOBT ? ? ? ?Cognitive impairment ?Seems to have some degree of cognitive impairment.  He is awake and alert but only oriented to self, place and some situation.  Not oriented to time.  Follows commands. ?-Reorientation and delirium precaution ?-Continue home Seroquel ? ?Goals of care, counseling/discussion ?Remains full code.  See discussion with  patient's wife on 5/11 ? ?Chronic obstructive pulmonary disease (HCC) ?Stable. ?-Continue nebulizers ? ?Dysphagia ?On tube feed per PEG tube ?-TF per dietitian. ? ?Protein-calorie malnutrition, severe (HCC) ?Nutrition Status: ?Nutrition Problem: Severe Malnutrition ?Etiology: chronic illness ?Signs/Symptoms: severe fat depletion, severe muscle depletion ?Interventions: Tube feeding, Prostat,  Juven ? ? ?PVD and carotid artery stenosis s/p right transcarotid artery revascularization ?On aspirin, Plavix and Lipitor at home. ?-Continue Lipitor ?-Holding aspirin and Plavix until H&H stable. ? ?H/O total right hip arthroplasty ?S/p recent right periprosthetic femoral fracture repair on 05/21/2021.  Stable. ?-Per orthopedic surgery ? ?Pulmonary infiltrates ?CT neck showed new LUL.  He was started on ceftriaxone and azithromycin but no respiratory symptoms..   Procalcitonin might not be reliable after orthopedic surgery. ?-Antibiotics discontinued.  Monitoring of antibiotics. ? ?HTN (hypertension) ?Slightly hypotensive. ?-Continue home midodrine. ? ?Anxiety and depression ?Stable.  Continue home medication ? ? ?Pressure skin injury: POA ?Pressure Injury Heel Posterior;Right Stage 1 -  Intact skin with non-blanchable redness of a localized area usually over a bony prominence. previous stage 1 with large blister now (Active)  ?   ?Location: Heel  ?Location Orientation: Posterior;Right  ?Staging: Stage 1 -  Intact skin with non-blanchable redness of a localized area usually over a bony prominence.  ?Wound Description (Comments): previous stage 1 with large blister now  ?Present on Admission: Yes  ?Dressing Type Foam - Lift dressing to assess site every shift 06/17/21 0900  ?   ?Pressure Injury 06/10/21 Foot Anterior;Left Unstageable - Full thickness tissue loss in which the base of the injury is covered by slough (yellow, tan, gray, green or brown) and/or eschar (tan, brown or black) in the wound bed. small area with  scab (Active)  ?06/10/21 1319  ?Location: Foot  ?Location Orientation: Anterior;Left  ?Staging: Unstageable - Full thickness tissue loss in which the base of the injury is covered by slough (yellow, tan, gray, green or brown) and/or eschar (tan, brown or black) in the wound bed.  ?Wound Description (Comments): small area with  scab  ?Present on Admission: Yes  ?Dressing Type None 06/17/21 0900  ? ?DVT  prophylaxis:  ?Place and maintain sequential compression device Start: 06/17/21 1333 ?SCDs Start: 06/16/21 8469 ? ?Code Status: Full code ?Family Communication: Updated patient's wife over the phone on 5/11  None at bedside today ?Level of care: Med-Surg.  ?Status is: Inpatient ?Remains inpatient appropriate because: Due to left hip fracture and evaluation and treatment of anemia ? ? ?Final disposition: SNF ?Consultants:  ?Orthopedic surgery ? ?Sch Meds:  ?Scheduled Meds: ? arformoterol  15 mcg Nebulization BID  ? vitamin C  250 mg Per Tube BID  ? atorvastatin  80 mg Per Tube QHS  ? budesonide (PULMICORT) nebulizer solution  0.25 mg Nebulization BID  ? chlorhexidine  15 mL Mouth Rinse BID  ? feeding supplement (OSMOLITE 1.5 CAL)  237 mL Per Tube 6 X Daily  ? feeding supplement (PROSource TF)  45 mL Per Tube BID  ? free water  120 mL Per Tube 6 X Daily  ? mouth rinse  15 mL Mouth Rinse q12n4p  ? melatonin  3 mg Per Tube QHS  ? metoprolol tartrate  12.5 mg Per Tube Daily  ? midodrine  2.5 mg Per Tube TID WC  ? multivitamin with minerals  1 tablet Per Tube Daily  ? nutrition supplement (JUVEN)  1 packet Per Tube BID BM  ? pantoprazole sodium  40 mg Per Tube Daily  ? QUEtiapine  25  mg Per Tube Daily  ? And  ? QUEtiapine  50 mg Per Tube QHS  ? revefenacin  175 mcg Nebulization Daily  ? sertraline  200 mg Per Tube Daily  ? zinc sulfate  220 mg Per Tube Daily  ? ?Continuous Infusions: ? methocarbamol (ROBAXIN) IV 500 mg (06/17/21 0537)  ? ?PRN Meds:.acetaminophen, albuterol, bisacodyl, fentaNYL (SUBLIMAZE) injection, guaiFENesin-dextromethorphan, methocarbamol **OR** methocarbamol (ROBAXIN) IV, ondansetron (ZOFRAN) IV, oxyCODONE, senna-docusate ? ?Antimicrobials: ?Anti-infectives (From admission, onward)  ? ? Start     Dose/Rate Route Frequency Ordered Stop  ? 06/16/21 1702  vancomycin (VANCOCIN) powder  Status:  Discontinued       ?   As needed 06/16/21 1703 06/16/21 1856  ? 06/16/21 1530  ceFAZolin (ANCEF) IVPB 2g/100 mL  premix       ? 2 g ?200 mL/hr over 30 Minutes Intravenous On call to O.R. 06/16/21 1407 06/16/21 1659  ? 06/16/21 0800  cefTRIAXone (ROCEPHIN) 2 g in sodium chloride 0.9 % 100 mL IVPB  Status:  Discontinued

## 2021-06-18 NOTE — Progress Notes (Signed)
Physical Therapy Treatment ?Patient Details ?Name: KE BUNGAY ?MRN: [id number] ?DOB: 07-04-1940 ?Today's Date: 06/18/2021 ? ? ?History of Present Illness 81 y.o. male with medical history significant for COPD, dysphagia, hypertension, PAD, depression, anxiety, CKD stage II, and ORIF of periprosthetic right femur fracture on 05/21/2021 (posterior precautions, 20% PWB) admitted with a fall at his SNF, Dx of L intertrochanteric fx of femur, s/p IM nail 06/16/21, WBAT LLE. ? ?  ?PT Comments  ? ? Pt limited to ROM/strengthening exercises in bed due to just beginning blood transfusion for Hgb at 7.1. ?Pt very resistant to L hip movement and struggles at times following commands, otherwise very pleasant and anxious to get moving once able. Continue PT per POC.  ?  ?Recommendations for follow up therapy are one component of a multi-disciplinary discharge planning process, led by the attending physician.  Recommendations may be updated based on patient status, additional functional criteria and insurance authorization. ? ?Follow Up Recommendations ? Skilled nursing-short term rehab (<3 hours/day) ?  ?  ?Assistance Recommended at Discharge Frequent or constant Supervision/Assistance  ?Patient can return home with the following A lot of help with walking and/or transfers;A lot of help with bathing/dressing/bathroom;Direct supervision/assist for financial management;Direct supervision/assist for medications management;Assistance with cooking/housework;Help with stairs or ramp for entrance ?  ?Equipment Recommendations ? None recommended by PT  ?  ?Recommendations for Other Services OT consult ? ? ?  ?Precautions / Restrictions Precautions ?Precautions: Posterior Hip;Fall ?Precaution Booklet Issued: Yes (comment) ?Precaution Comments: pt cannot verbalize precautions for R hip ?Required Braces or Orthoses: Splint/Cast ?Splint/Cast: AFO R foot for in bed ?Restrictions ?Weight Bearing Restrictions: Yes ?RLE Weight Bearing: Partial  weight bearing ?RLE Partial Weight Bearing Percentage or Pounds: 20 ?LLE Weight Bearing: Weight bearing as tolerated  ?  ? ?Mobility ? Bed Mobility ?  ?  ?  ?  ?  ?  ?  ?General bed mobility comments: deferred due to nursing just starting transfusion ?  ? ?Transfers ?  ?  ?  ?  ?  ?  ?  ?  ?  ?  ?  ? ?Ambulation/Gait ?  ?  ?  ?  ?  ?  ?  ?  ? ? ?Stairs ?  ?  ?  ?  ?  ? ? ?Wheelchair Mobility ?  ? ?Modified Rankin (Stroke Patients Only) ?  ? ? ?  ?Balance   ?  ?  ?  ?  ?  ?  ?  ?  ?  ?  ?  ?  ?  ?  ?  ?  ?  ?  ?  ? ?  ?Cognition Arousal/Alertness: Awake/alert ?Behavior During Therapy: [facility] for tasks assessed/performed ?Overall Cognitive Status: History of cognitive impairments - at baseline ?  ?  ?  ?  ?  ?  ?  ?  ?  ?  ?  ?  ?  ?  ?  ?  ?General Comments: pt unable to recall precautions or WB status, pleasant affect, oriented to self, stated he's 81 years old and that he's in "[facility] therapy" ?  ?  ? ?  ?Exercises Total Joint Exercises ?Ankle Circles/Pumps: AROM, Left, 5 reps, Supine ?Heel Slides: AAROM, 5 reps, Both ?Hip ABduction/ADduction: AAROM, Right, 5 reps ?General Exercises - Lower Extremity ?Short Arc Quad: AAROM, Both, 10 reps ? ?  ?General Comments General comments (skin integrity, edema, etc.): Pt required AAROM due to pain and comprehension of desired task ?  ?  ? ?  Pertinent Vitals/Pain Pain Assessment ?Breathing: normal ?Negative Vocalization: occasional moan/groan, low speech, negative/disapproving quality ?Facial Expression: facial grimacing ?Body Language: rigid, fists clenched, knees up, pushing/pulling away, strikes out ?Consolability: distracted or reassured by voice/touch ?PAINAD Score: 6  ? ? ?Home Living   ?  ?  ?  ?  ?  ?  ?  ?  ?  ?   ?  ?Prior Function    ?  ?  ?   ? ?PT Goals (current goals can now be found in the care plan section) Acute Rehab PT Goals ?Patient Stated Goal: agrees he wants to walk again ? ?  ?Frequency ? ? ? Min 4X/week ? ? ? ?  ?PT Plan Current plan remains appropriate   ? ? ?Co-evaluation   ?  ?  ?  ?  ? ?  ?AM-PAC PT "6 Clicks" Mobility   ?Outcome Measure ? Help needed turning from your back to your side while in a flat bed without using bedrails?: A Little ?Help needed moving from lying on your back to sitting on the side of a flat bed without using bedrails?: A Lot ?Help needed moving to and from a bed to a chair (including a wheelchair)?: Total ?Help needed standing up from a chair using your arms (e.g., wheelchair or bedside chair)?: Total ?Help needed to walk in hospital room?: Total ?Help needed climbing 3-5 steps with a railing? : Total ?6 Click Score: 9 ? ?  ?End of Session   ?Activity Tolerance: Patient limited by fatigue;Patient limited by pain ?Patient left: in bed;with call bell/phone within reach;with bed alarm set;with SCD's reapplied ?Nurse Communication: Mobility status ?PT Visit Diagnosis: Other abnormalities of gait and mobility (R26.89);History of falling (Z91.81) ?  ? ? ?Time: 3474-2595 ?PT Time Calculation (min) (ACUTE ONLY): 10 min ? ?Charges:  $Therapeutic Exercise: 8-22 mins          ?          ?Zadie Cleverly, PTA ? ? ? ?Jannet Askew ?06/18/2021, 1:35 PM ? ?

## 2021-06-18 NOTE — [place] FL2 (Signed)
?[place] MEDICAID FL2 LEVEL OF CARE SCREENING TOOL  ?  ? ?IDENTIFICATION  ?Patient Name: ?William Kirk Birthdate: Jul 18, 1940 Sex: male Admission Date (Current Location): ?06/16/2021  ?[place] Number: ? [place] ?  Facility and Address:  ?[facility],  [address], [place] [address] ?     Provider Number: ?[id number]  ?Attending Physician Name and Address:  ?Almon Hercules, MD ? Relative Name and Phone Number:  ?  ?   ?Current Level of Care: ?Hospital Recommended Level of Care: ?Skilled Nursing Facility Prior Approval Number: ?  ? ?Date Approved/Denied: ?  PASRR Number: ?[id number] F ? ?Discharge Plan: ?SNF ?  ? ?Current Diagnoses: ?Patient Active Problem List  ? Diagnosis Date Noted  ? Goals of care, counseling/discussion 06/17/2021  ? Fall at nursing home 06/17/2021  ? Cognitive impairment 06/17/2021  ? Closed fracture of left hip likely due to unwitnessed fall at nursing home 06/16/2021  ? Iron deficiency anemia 06/10/2021  ? Grade I diastolic dysfunction 06/10/2021  ? Leukocytosis 05/20/2021  ? 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 Right femoral fracture (HCC) 05/19/2021  ? Asymptomatic carotid artery stenosis without infarction, right 02/24/2021  ? Abnormality of gait 04/16/2020  ? 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 Pressure injury of skin 11/06/2018  ? 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 Hemoptysis 07/07/2018  ? Chronic obstructive pulmonary disease (HCC) 11/07/2016  ? Pulmonary infiltrates 11/07/2016  ? Dysphagia 09/26/2016  ? Anxiety and depression 09/21/2016  ? Aspiration pneumonia (HCC) 09/21/2016  ? HTN (hypertension) 09/21/2016  ? HCAP (healthcare-associated pneumonia)   ? PVD and carotid artery stenosis s/p right transcarotid artery revascularization 06/21/2016  ? Bronchiectasis (HCC) 06/21/2016  ? Protein-calorie malnutrition, severe (HCC) 06/21/2016  ? Multifocal pneumonia 06/20/2016  ?  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 H/O total right hip arthroplasty 12/03/2012  ? Other tear of medial meniscus, current injury, unspecified knee, initial encounter 12/03/2012  ? ? ?Orientation RESPIRATION BLADDER Height & Weight   ?  ?Self ? Normal Incontinent Weight: 79.4 kg ?Height:  6' (182.9 cm)  ?BEHAVIORAL SYMPTOMS/MOOD NEUROLOGICAL BOWEL NUTRITION STATUS  ?    Incontinent Feeding tube  ?AMBULATORY STATUS COMMUNICATION OF NEEDS Skin   ?Extensive Assist Verbally Surgical wounds ?  ?  ?  ?    ?     ?     ? ? ?Personal Care Assistance Level of Assistance  ?Bathing, Feeding, Dressing Bathing Assistance: Maximum assistance ?Feeding assistance: Maximum assistance ?Dressing Assistance: Maximum assistance ?   ? ?Functional Limitations Info  ?Sight, Hearing, Speech Sight Info: Impaired ?Hearing Info: Adequate ?Speech Info: Impaired  ? ? ?SPECIAL CARE FACTORS FREQUENCY  ?PT (By licensed PT), OT (By licensed OT)   ?  ?PT Frequency: 5 x week ?OT Frequency: 5 x a week ?  ?  ?  ?   ? ? ?Contractures Contractures Info: Not present  ? ? ?Additional Factors Info  ?Code Status Code Status Info: full ?Allergies Info: nka ?  ?  ?  ?   ? ?Current Medications (06/18/2021):  This is the current hospital active medication list ?Current Facility-Administered Medications  ?Medication Dose Route Frequency Provider Last Rate Last Admin  ? acetaminophen (TYLENOL) tablet 650 mg  650 mg Oral Q6H PRN Candelaria Stagers T, MD   650 mg at 06/17/21 1036  ? albuterol (PROVENTIL) (2.5 MG/3ML) 0.083% nebulizer solution 2.5 mg  2.5 mg  Nebulization Q2H PRN [name], MD      ? arformoterol [facility]) nebulizer solution 15 mcg  15 mcg Nebulization BID [name], MD   15 mcg at 06/18/21 1610  ? ascorbic acid (VITAMIN C) tablet 250 mg  250 mg Per Tube BID Candelaria Stagers T, MD   250 mg at 06/18/21 9604  ? atorvastatin (LIPITOR) tablet 80 mg  80 mg Per Tube QHS [name], MD    80 mg at 06/17/21 2114  ? bisacodyl (DULCOLAX) suppository 10 mg  10 mg Rectal Daily PRN [name], MD      ? budesonide (PULMICORT) nebulizer solution 0.25 mg  0.25 mg Nebulization BID [name], MD   0.25 mg at 06/18/21 0724  ? chlorhexidine (PERIDEX) 0.12 % solution 15 mL  15 mL Mouth Rinse BID Candelaria Stagers T, MD   15 mL at 06/18/21 0854  ? feeding supplement (OSMOLITE 1.5 CAL) liquid 237 mL  237 mL Per Tube 6 X Daily Candelaria Stagers T, MD   237 mL at 06/18/21 1206  ? feeding supplement (PROSource TF) liquid 45 mL  45 mL Per Tube BID Candelaria Stagers T, MD   45 mL at 06/18/21 0610  ? fentaNYL (SUBLIMAZE) injection 12.5-25 mcg  12.5-25 mcg Intravenous Q2H PRN [name], MD   12.5 mcg at 06/17/21 2110  ? free water 120 mL  120 mL Per Tube 6 X Daily Candelaria Stagers T, MD   120 mL at 06/18/21 0855  ? guaiFENesin-dextromethorphan (ROBITUSSIN DM) 100-10 MG/5ML syrup 5 mL  5 mL Per Tube Q12H PRN [name], MD      ? MEDLINE mouth rinse  15 mL Mouth Rinse q12n4p Alanda Slim, Taye T, MD   15 mL at 06/17/21 1600  ? melatonin tablet 3 mg  3 mg Per Tube QHS [name], MD   3 mg at 06/17/21 2114  ? methocarbamol (ROBAXIN) tablet 500 mg  500 mg Per Tube Q6H PRN [name], MD   500 mg at 06/17/21 1328  ? Or  ? methocarbamol (ROBAXIN) 500 mg in dextrose 5 % 50 mL IVPB  500 mg Intravenous Q6H PRN [name], MD 100 mL/hr at 06/17/21 0537 500 mg at 06/17/21 0537  ? metoprolol tartrate (LOPRESSOR) tablet 12.5 mg  12.5 mg Per Tube Daily [name], MD   12.5 mg at 06/18/21 0851  ? midodrine (PROAMATINE) tablet 2.5 mg  2.5 mg Per Tube TID WC Candelaria Stagers T, MD   2.5 mg at 06/18/21 1205  ? multivitamin with minerals tablet 1 tablet  1 tablet Per Tube Daily Almon Hercules, MD   1 tablet at 06/17/21 1735  ? nutrition supplement (JUVEN) (JUVEN) powder packet 1 packet  1 packet Per Tube BID BM Almon Hercules, MD   1 packet at 06/18/21 1204  ? ondansetron (ZOFRAN) injection 4  mg  4 mg Intravenous Q6H PRN [name], MD      ? oxyCODONE (ROXICODONE) 5 MG/5ML solution 5 mg  5 mg Per Tube Q6H PRN Almon Hercules, MD   5 mg at 06/17/21 1734  ? pantoprazole sodium (PROTONIX) 40 mg/20 mL oral suspension 40 mg  40 mg Per Tube Daily [name], MD   40 mg at 06/18/21 0855  ? QUEtiapine (SEROQUEL) tablet 25 mg  25 mg Per Tube Daily [name], MD   25 mg at 06/18/21 0850  ? And  ?  QUEtiapine (SEROQUEL) tablet 50 mg  50 mg Per Tube QHS [name], MD   50 mg at 06/17/21 2113  ? revefenacin (YUPELRI) nebulizer solution 175 mcg  175 mcg Nebulization Daily [name], MD   175 mcg at 06/18/21 6295  ? senna-docusate (Senokot-S) tablet 1 tablet  1 tablet Per Tube QHS PRN [name], MD      ? sertraline (ZOLOFT) tablet 200 mg  200 mg Per Tube Daily [name], MD   200 mg at 06/18/21 0850  ? zinc sulfate capsule 220 mg  220 mg Per Tube Daily Candelaria Stagers T, MD   220 mg at 06/18/21 0850  ? ? ? ?Discharge Medications: ?Please see discharge summary for a list of discharge medications. ? ?Relevant Imaging Results: ? ?Relevant Lab Results: ? ? ?Additional Information ?ss#[phone];Moderna x2;Booster x2 ? ?Golda Acre, RN ? ? ? ? ?

## 2021-06-19 DIAGNOSIS — R4189 Other symptoms and signs involving cognitive functions and awareness: Secondary | ICD-10-CM | POA: Diagnosis not present

## 2021-06-19 DIAGNOSIS — J438 Other emphysema: Secondary | ICD-10-CM | POA: Diagnosis not present

## 2021-06-19 DIAGNOSIS — S72002A Fracture of unspecified part of neck of left femur, initial encounter for closed fracture: Secondary | ICD-10-CM | POA: Diagnosis not present

## 2021-06-19 DIAGNOSIS — F419 Anxiety disorder, unspecified: Secondary | ICD-10-CM | POA: Diagnosis not present

## 2021-06-19 LAB — TYPE AND SCREEN
ABO/RH(D): O POS
Antibody Screen: NEGATIVE
Unit division: 0
Unit division: 0

## 2021-06-19 LAB — CBC
HCT: 28.3 % — ABNORMAL LOW (ref 39.0–52.0)
Hemoglobin: 9 g/dL — ABNORMAL LOW (ref 13.0–17.0)
MCH: 27.9 pg (ref 26.0–34.0)
MCHC: 31.8 g/dL (ref 30.0–36.0)
MCV: 87.6 fL (ref 80.0–100.0)
Platelets: 264 10*3/uL (ref 150–400)
RBC: 3.23 MIL/uL — ABNORMAL LOW (ref 4.22–5.81)
RDW: 15.8 % — ABNORMAL HIGH (ref 11.5–15.5)
WBC: 11.2 10*3/uL — ABNORMAL HIGH (ref 4.0–10.5)
nRBC: 0 % (ref 0.0–0.2)

## 2021-06-19 LAB — RENAL FUNCTION PANEL
Albumin: 2.5 g/dL — ABNORMAL LOW (ref 3.5–5.0)
Anion gap: 7 (ref 5–15)
BUN: 39 mg/dL — ABNORMAL HIGH (ref 8–23)
CO2: 27 mmol/L (ref 22–32)
Calcium: 8.3 mg/dL — ABNORMAL LOW (ref 8.9–10.3)
Chloride: 102 mmol/L (ref 98–111)
Creatinine, Ser: 0.86 mg/dL (ref 0.61–1.24)
GFR, Estimated: >60 mL/min (ref >60)
Glucose, Bld: 106 mg/dL — ABNORMAL HIGH (ref 70–99)
Phosphorus: 2.1 mg/dL — ABNORMAL LOW (ref 2.5–4.6)
Potassium: 4.2 mmol/L (ref 3.5–5.1)
Sodium: 136 mmol/L (ref 135–145)

## 2021-06-19 LAB — MAGNESIUM: Magnesium: 2 mg/dL (ref 1.7–2.4)

## 2021-06-19 LAB — BPAM RBC
Blood Product Expiration Date: 202306012359
Blood Product Expiration Date: 202306122359
ISSUE DATE / TIME: 202305111543
ISSUE DATE / TIME: 202305121151
Unit Type and Rh: 5100
Unit Type and Rh: 5100

## 2021-06-19 LAB — GLUCOSE, CAPILLARY
Glucose-Capillary: 122 mg/dL — ABNORMAL HIGH (ref 70–99)
Glucose-Capillary: 142 mg/dL — ABNORMAL HIGH (ref 70–99)
Glucose-Capillary: 112 mg/dL — ABNORMAL HIGH (ref 70–99)
Glucose-Capillary: 106 mg/dL — ABNORMAL HIGH (ref 70–99)
Glucose-Capillary: 123 mg/dL — ABNORMAL HIGH (ref 70–99)

## 2021-06-19 MED ORDER — POLYETHYLENE GLYCOL 3350 17 G PO PACK
17.0000 g | PACK | Freq: Two times a day (BID) | ORAL | Status: DC
  Administered 2021-06-19: 17 g
  Filled 2021-06-19: qty 1

## 2021-06-19 MED ORDER — GERHARDT'S BUTT CREAM
TOPICAL_CREAM | Freq: Four times a day (QID) | CUTANEOUS | Status: DC
  Filled 2021-06-19: qty 1

## 2021-06-19 MED ORDER — CLOPIDOGREL BISULFATE 75 MG PO TABS
75.0000 mg | ORAL_TABLET | Freq: Every day | ORAL | Status: DC
  Administered 2021-06-19 – 2021-06-21 (×3): 75 mg
  Filled 2021-06-19 (×3): qty 1

## 2021-06-19 MED ORDER — ENOXAPARIN SODIUM 40 MG/0.4ML IJ SOSY
40.0000 mg | PREFILLED_SYRINGE | INTRAMUSCULAR | Status: DC
  Administered 2021-06-19 – 2021-06-20 (×2): 40 mg via SUBCUTANEOUS
  Filled 2021-06-19 (×2): qty 0.4

## 2021-06-19 MED ORDER — ASPIRIN 81 MG PO CHEW
81.0000 mg | CHEWABLE_TABLET | Freq: Every day | ORAL | Status: DC
  Administered 2021-06-19 – 2021-06-21 (×3): 81 mg
  Filled 2021-06-19 (×3): qty 1

## 2021-06-19 MED ORDER — SENNOSIDES-DOCUSATE SODIUM 8.6-50 MG PO TABS
1.0000 | ORAL_TABLET | Freq: Two times a day (BID) | ORAL | Status: DC
  Administered 2021-06-19 – 2021-06-21 (×3): 1
  Filled 2021-06-19 (×4): qty 1

## 2021-06-19 NOTE — Progress Notes (Signed)
Physical Therapy Treatment ?Patient Details ?Name: William Kirk ?MRN: [id number] ?DOB: September 01, 1940 ?Today's Date: 06/19/2021 ? ? ?History of Present Illness 81 y.o. male with medical history significant for COPD, dysphagia, hypertension, PAD, depression, anxiety, CKD stage II, and ORIF of periprosthetic right femur fracture on 05/21/2021 (posterior precautions, 20% PWB) admitted with a fall at his SNF, Dx of L intertrochanteric fx of femur, s/p IM nail 06/16/21, WBAT LLE. ? ?  ?PT Comments  ? ? Pt gives forth good effort and has good UE strength.  He was able to stand briefly, but this fatigued him.  Able to maintain proper WB status, but needs cues for precautions. Con't to recommend SNF.   ?Recommendations for follow up therapy are one component of a multi-disciplinary discharge planning process, led by the attending physician.  Recommendations may be updated based on patient status, additional functional criteria and insurance authorization. ? ?Follow Up Recommendations ? Skilled nursing-short term rehab (<3 hours/day) ?  ?  ?Assistance Recommended at Discharge Frequent or constant Supervision/Assistance  ?Patient can return home with the following A lot of help with walking and/or transfers;A lot of help with bathing/dressing/bathroom;Direct supervision/assist for financial management;Direct supervision/assist for medications management;Assistance with cooking/housework;Help with stairs or ramp for entrance ?  ?Equipment Recommendations ? None recommended by PT  ?  ?Recommendations for Other Services   ? ? ?  ?Precautions / Restrictions Precautions ?Precautions: Posterior Hip;Fall ?Restrictions ?Weight Bearing Restrictions: Yes ?RLE Weight Bearing: Partial weight bearing ?RLE Partial Weight Bearing Percentage or Pounds: 20 ?LLE Weight Bearing: Weight bearing as tolerated  ?  ? ?Mobility ? Bed Mobility ?Overal bed mobility: Needs Assistance ?Bed Mobility: Supine to Sit, Sit to Supine ?  ?  ?Supine to sit: Min  assist, +2 for physical assistance ?Sit to supine: Max assist, +2 for physical assistance ?  ?General bed mobility comments: good UE strength to A ?  ? ?Transfers ?Overall transfer level: Needs assistance ?Equipment used: Rolling walker (2 wheels) ?Transfers: Sit to/from Stand ?Sit to Stand: Max assist, +2 physical assistance ?  ?  ?  ?  ?  ?General transfer comment: Stood  ~ 10 seconds with adherence to R LE WB restrictions before sitting down.  Very fatigued from this. ?  ? ?Ambulation/Gait ?  ?  ?  ?  ?  ?  ?  ?  ? ? ?Stairs ?  ?  ?  ?  ?  ? ? ?Wheelchair Mobility ?  ? ?Modified Rankin (Stroke Patients Only) ?  ? ? ?  ?Balance   ?  ?  ?  ?  ?  ?  ?  ?  ?  ?  ?  ?  ?  ?  ?  ?  ?  ?  ?  ? ?  ?Cognition Arousal/Alertness: Awake/alert ?Behavior During Therapy: [facility] for tasks assessed/performed ?Overall Cognitive Status: History of cognitive impairments - at baseline ?  ?  ?  ?  ?  ?  ?  ?  ?  ?  ?  ?  ?  ?  ?  ?  ?General Comments: Follows directions well ?  ?  ? ?  ?Exercises   ? ?  ?General Comments   ?  ?  ? ?Pertinent Vitals/Pain Pain Assessment ?Pain Assessment: Faces ?Faces Pain Scale: Hurts little more ?Pain Location: L hip with movement ?Pain Descriptors / Indicators: Grimacing ?Pain Intervention(s): Monitored during session, Repositioned, Limited activity within patient's tolerance  ? ? ?Home Living   ?  ?  ?  ?  ?  ?  ?  ?  ?  ?   ?  ?  Prior Function    ?  ?  ?   ? ?PT Goals (current goals can now be found in the care plan section) Acute Rehab PT Goals ?Patient Stated Goal: agrees he wants to walk again ?PT Goal Formulation: With patient ?Time For Goal Achievement: 07/01/21 ?Potential to Achieve Goals: Good ?Progress towards PT goals: Progressing toward goals ? ?  ?Frequency ? ? ? Min 4X/week ? ? ? ?  ?PT Plan Current plan remains appropriate  ? ? ?Co-evaluation   ?  ?  ?  ?  ? ?  ?AM-PAC PT "6 Clicks" Mobility   ?Outcome Measure ? Help needed turning from your back to your side while in a flat bed without  using bedrails?: A Lot ?Help needed moving from lying on your back to sitting on the side of a flat bed without using bedrails?: A Lot ?Help needed moving to and from a bed to a chair (including a wheelchair)?: A Lot ?Help needed standing up from a chair using your arms (e.g., wheelchair or bedside chair)?: A Lot ?Help needed to walk in hospital room?: Total ?Help needed climbing 3-5 steps with a railing? : Total ?6 Click Score: 10 ? ?  ?End of Session Equipment Utilized During Treatment: Gait belt ?Activity Tolerance: Patient limited by pain;Patient limited by fatigue ?Patient left: in bed;with call bell/phone within reach;with bed alarm set;with family/visitor present ?Nurse Communication: Mobility status ?PT Visit Diagnosis: Other abnormalities of gait and mobility (R26.89);History of falling (Z91.81) ?  ? ? ?Time: 1610-9604 ?PT Time Calculation (min) (ACUTE ONLY): 17 min ? ?Charges:  $Therapeutic Activity: 8-22 mins          ?          ? ?Clydie Braun L. Katrinka Blazing, PT ? ?06/19/2021 ? ? ? ?Enzo Montgomery ?06/19/2021, 3:46 PM ? ?

## 2021-06-19 NOTE — Progress Notes (Signed)
?PROGRESS NOTE ? ?William Kirk ZOX:[id number] DOB: [birth year]  ? ?PCP: Floreen Comber, PA-C ? ?Patient is from: SNF ? ?DOA: 06/16/2021 LOS: 3 ? ?Chief complaints ?Chief Complaint  ?Patient presents with  ? Fall  ?  ? 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  CT head and cervical spine without acute finding.  X-ray showed intertrochanteric left femoral fracture.  ? ?Patient underwent intramedullary nailing on 06/16/2021.  Postop, Hgb dropped to 6.4 (baseline 7-8).  Transfused 2 units with appropriate response.  Received IV iron as well.  Plavix and aspirin resumed. ? ?Medically optimized for discharge to SNF pending insurance authorization.  ? ?Subjective: ?Seen and examined earlier this morning.  No major events overnight or this morning.  No complaints but not a great historian.  He denies pain, shortness of breath, GI or UTI symptoms.  He is awake and alert but only oriented to self and place.  He follows commands. ? ?Objective: ?Vitals:  ? 06/19/21 0424 06/19/21 0732 06/19/21 1000 06/19/21 1126  ?BP:   121/69 (!) 112/57  ?Pulse:   84 78  ?Resp:   20 18  ?Temp:   98.2 ?F (36.8 ?C) (!) 97.5 ?F (36.4 ?C)  ?TempSrc:   Axillary Oral  ?SpO2:  94% 96% 92%  ?Weight: 74.6 kg     ?Height:      ? ? ?Examination: ? ?GENERAL: Appears frail.  Nontoxic. ?HEENT: MMM.  Vision and hearing grossly intact.  ?NECK: Supple.  No apparent JVD.  ?RESP: 92% on RA.  No IWOB.  Fair aeration bilaterally. ?CVS:  RRR. Heart sounds normal.  ?ABD/GI/GU: BS+. Abd soft, NTND.  ?MSK/EXT:   No apparent deformity.  Significant muscle mass and subcu fat loss. ?SKIN: Dressing over left thigh DCI.  No ecchymosis  or hematoma. ?NEURO: Awake and alert. Oriented to self and place.  Follows commands.  No apparent focal neuro deficit. ?PSYCH: Calm. Normal affect.  ? ?Procedures:  ?5/10-intramedullary nailing of left intertrochanteric femoral fracture ? ?Microbiology summarized: ?None ? ?Assessment and Plan: ?* Closed fracture of left hip likely due to unwitnessed fall at nursing home ?X-ray showed intertrochanteric left femoral bone fracture. ?-S/p intramedullary nailing by Dr. Blanchie Dessert on 5/10. ?-WBAT as tolerated on LLE ?-Subcu Lovenox for VTE prophylaxis for 4-week ?-Outpatient follow-up in 2 weeks for wound check ?-Pain control per orthopedic surgery. ?-PT/OT ? ?Iron deficiency anemia ?Recent Labs  ?  05/31/21 ?8119 06/01/21 ?0231 06/10/21 ?0406 06/11/21 ?1478 06/16/21 ?2956 06/17/21 ?0501 06/17/21 ?1140 06/18/21 ?0431 06/18/21 ?2113 06/19/21 ?2130  ?HGB 7.1* 8.6* 8.9* 7.9* 9.1* 7.1* 6.4* 7.1* 8.2* 9.0*  ?Some drop in Hgb postop.  No obvious bleeding at surgical site or from GI.  Anemia panel with some degree of iron deficiency.  Transfused 2 units with appropriate response ?-IV iron on 5/12 ?-Continue monitoring ? ? ? ?Cognitive impairment ?Seems to have some degree of cognitive impairment.  He is awake and alert but only oriented to self, place and some situation.  Not oriented to time.  Follows commands. ?-Reorientation and delirium precaution ?-Continue home Seroquel ? ?Goals of care, counseling/discussion ?Remains full code.  See discussion with patient's wife on 5/11 ? ?Chronic obstructive pulmonary disease (HCC) ?Stable. ?-Continue nebulizers ? ?Dysphagia ?On tube feed  per PEG tube ?-TF per dietitian. ? ?Protein-calorie malnutrition, severe (HCC) ?Nutrition Status: ?Nutrition Problem: Severe Malnutrition ?Etiology: chronic illness ?Signs/Symptoms: severe fat depletion, severe muscle depletion ?Interventions: Tube feeding, Prostat, Juven ? ? ?PVD and carotid artery stenosis s/p right transcarotid artery  revascularization ?On aspirin, Plavix and Lipitor at home. ?-Continue Lipitor, Plavix and aspirin ? ?H/O total right hip arthroplasty ?S/p recent right periprosthetic femoral fracture repair on 05/21/2021.  Stable. ?-Per orthopedic surgery ? ?Pulmonary infiltrates ?CT neck showed new LUL.  He was started on ceftriaxone and azithromycin but no respiratory symptoms..   Procalcitonin might not be reliable after orthopedic surgery. ?-Stable off antibiotics. ? ?HTN (hypertension) ?Slightly hypotensive. ?-Continue home midodrine. ? ?Anxiety and depression ?Stable.  Continue home medication ? ? ?Pressure skin injury: POA ?Pressure Injury Heel Posterior;Right Stage 1 -  Intact skin with non-blanchable redness of a localized area usually over a bony prominence. previous stage 1 with large blister now (Active)  ?   ?Location: Heel  ?Location Orientation: Posterior;Right  ?Staging: Stage 1 -  Intact skin with non-blanchable redness of a localized area usually over a bony prominence.  ?Wound Description (Comments): previous stage 1 with large blister now  ?Present on Admission: Yes  ?Dressing Type Impregnated gauze (petrolatum);Gauze (Comment) 06/19/21 1332  ?   ?Pressure Injury 06/10/21 Foot Anterior;Left Unstageable - Full thickness tissue loss in which the base of the injury is covered by slough (yellow, tan, gray, green or brown) and/or eschar (tan, brown or black) in the wound bed. small area with  scab (Active)  ?06/10/21 1319  ?Location: Foot  ?Location Orientation: Anterior;Left  ?Staging: Unstageable - Full thickness tissue loss in which the base of the injury is covered by slough (yellow, tan, gray, green or brown) and/or eschar (tan, brown or black) in the wound bed.  ?Wound Description (Comments): small area with  scab  ?Present on Admission: Yes  ?Dressing Type Impregnated gauze (petrolatum);Gauze (Comment) 06/19/21 1332  ? ?DVT prophylaxis:  ?enoxaparin (LOVENOX) injection 40 mg Start: 06/19/21 1515 ?Place and  maintain sequential compression device Start: 06/17/21 1333 ?SCDs Start: 06/16/21 9562 ? ?Code Status: Full code ?Family Communication: None at bedside today. ?Level of care: Med-Surg.  ?Status is: Inpatient ?Remains inpatient appropriate because: Safe disposition/SNF ? ? ?Final disposition: SNF ?Consultants:  ?Orthopedic surgery ? ?Sch Meds:  ?Scheduled Meds: ? arformoterol  15 mcg Nebulization BID  ? vitamin C  250 mg Per Tube BID  ? aspirin  81 mg Per Tube Daily  ? atorvastatin  80 mg Per Tube QHS  ? budesonide (PULMICORT) nebulizer solution  0.25 mg Nebulization BID  ? chlorhexidine  15 mL Mouth Rinse BID  ? clopidogrel  75 mg Per Tube Daily  ? enoxaparin (LOVENOX) injection  40 mg Subcutaneous Q24H  ? feeding supplement (OSMOLITE 1.5 CAL)  237 mL Per Tube 6 X Daily  ? feeding supplement (PROSource TF)  45 mL Per Tube BID  ? free water  120 mL Per Tube 6 X Daily  ? Gerhardt's butt cream   Topical QID  ? mouth rinse  15 mL Mouth Rinse q12n4p  ? melatonin  3 mg Per Tube QHS  ? metoprolol tartrate  12.5 mg Per Tube Daily  ? midodrine  2.5 mg Per Tube TID WC  ? multivitamin with minerals  1 tablet Per Tube Daily  ? nutrition supplement (JUVEN)  1 packet Per Tube BID BM  ? pantoprazole sodium  40 mg Per Tube Daily  ? polyethylene glycol  17 g Per Tube BID  ? QUEtiapine  25 mg Per Tube Daily  ? And  ? QUEtiapine  50 mg Per Tube QHS  ? revefenacin  175 mcg Nebulization Daily  ? senna-docusate  1 tablet Per Tube BID  ? sertraline  200 mg Per Tube Daily  ? zinc sulfate  220 mg Per Tube Daily  ? ?Continuous Infusions: ? methocarbamol (ROBAXIN) IV 500 mg (06/17/21 0537)  ? ?PRN Meds:.acetaminophen, albuterol, bisacodyl, fentaNYL (SUBLIMAZE) injection, guaiFENesin-dextromethorphan, methocarbamol **OR** methocarbamol (ROBAXIN) IV, ondansetron (ZOFRAN) IV, oxyCODONE ? ?Antimicrobials: ?Anti-infectives (From admission, onward)  ? ? Start     Dose/Rate Route Frequency Ordered Stop  ? 06/16/21 1702  vancomycin (VANCOCIN) powder   Status:  Discontinued       ?   As needed 06/16/21 1703 06/16/21 1856  ? 06/16/21 1530  ceFAZolin (ANCEF) IVPB 2g/100 mL premix       ? 2 g ?200 mL/hr over 30 Minutes Intravenous On call to O.R. 06/16/21 1407 06/16/21

## 2021-06-19 NOTE — TOC Progression Note (Signed)
Transition of Care (TOC) - Progression Note  ? ? ?Patient Details  ?Name: William Kirk ?MRN: [id number] ?Date of Birth: 1940/10/08 ? ?Transition of Care (TOC) CM/SW Contact  ?Ross Ludwig, LCSW ?Phone Number: ?06/19/2021, 11:54 AM ? ?Clinical Narrative:    ? ?CSW attempted to contact [place] to see if they have received insurance auth yet on patient.  CSW left a message awaiting for a call back. ? ? ?Expected Discharge Plan: [place] ([place]) ?Barriers to Discharge: No Barriers Identified ? ?Expected Discharge Plan and Services ?Expected Discharge Plan: [place] ([place]) ?  ?Discharge Planning Services: CM Consult ?Post Acute Care Choice: [place] ?Living arrangements for the past 2 months: [place] ?                ?  ?  ?  ?  ?  ?  ?  ?  ?  ?  ? ? ?Social Determinants of Health (SDOH) Interventions ?  ? ?Readmission Risk Interventions ? ?  02/25/2021  ?  2:25 PM 11/08/2018  ? 11:19 AM 10/23/2018  ?  8:44 AM  ?Readmission Risk Prevention Plan  ?Post Dischage Appt Complete    ?Medication Screening Complete    ?Transportation Screening Complete Complete Complete  ?PCP or Specialist Appt within 3-5 Days  Complete Complete  ?[place] or Home Care Consult  Complete Complete  ?Social Work Consult for [place] Planning/Counseling  Complete Complete  ?Palliative Care Screening  Not Applicable Not Applicable  ?Medication Review Press photographer)  Complete Complete  ? ? ?

## 2021-06-19 NOTE — Consult Note (Signed)
WOC Nurse Consult Note: ?Reason for Consult: irritant contact dermatitis to right buttocks, perineal area and posterior thighs, Stage 2 PI to right heel, Unstageable PI to left anterior foot. ?Wound type:Pressure, moisture ?Pressure Injury POA: Yes ?Measurement:Per nursing flow sheet on 06/17/21 by  M. Bullins, WTA ?Wound bed:Per nursing flow sheet ?Drainage (amount, consistency, odor) Scant, small ?Periwound:erythema and maceration to buttocks ?Dressing procedure/placement/frequency: Patient is turned and repositioned per house protocol, bilateral heel pressure redistribution boots are provided as well as a chair cushion for use both here and downstream. Gerhart's Butt cream (1:1:1 zinc oxide, hydrocortisone cream and lotrimin cream) is to be applied to the buttocks 4 times daily. Xeroform gauze will be used to the two foot wounds and changed daily. ? ?[place] nursing team will not follow, but will remain available to this patient, the nursing and medical teams.  Please re-consult if needed. ?Thanks, ?Maudie Flakes, MSN, RN, [place], [place], CWON-AP, [place]  ?Pager# [phone]  ? ? ? ? ? ?  ?

## 2021-06-20 DIAGNOSIS — J438 Other emphysema: Secondary | ICD-10-CM | POA: Diagnosis not present

## 2021-06-20 DIAGNOSIS — R4189 Other symptoms and signs involving cognitive functions and awareness: Secondary | ICD-10-CM | POA: Diagnosis not present

## 2021-06-20 DIAGNOSIS — S72002A Fracture of unspecified part of neck of left femur, initial encounter for closed fracture: Secondary | ICD-10-CM | POA: Diagnosis not present

## 2021-06-20 DIAGNOSIS — F419 Anxiety disorder, unspecified: Secondary | ICD-10-CM | POA: Diagnosis not present

## 2021-06-20 LAB — CBC
HCT: 28.7 % — ABNORMAL LOW (ref 39.0–52.0)
Hemoglobin: 8.9 g/dL — ABNORMAL LOW (ref 13.0–17.0)
MCH: 27.7 pg (ref 26.0–34.0)
MCHC: 31 g/dL (ref 30.0–36.0)
MCV: 89.4 fL (ref 80.0–100.0)
Platelets: 285 10*3/uL (ref 150–400)
RBC: 3.21 MIL/uL — ABNORMAL LOW (ref 4.22–5.81)
RDW: 16 % — ABNORMAL HIGH (ref 11.5–15.5)
WBC: 10.4 10*3/uL (ref 4.0–10.5)
nRBC: 0 % (ref 0.0–0.2)

## 2021-06-20 LAB — RENAL FUNCTION PANEL
Albumin: 2.4 g/dL — ABNORMAL LOW (ref 3.5–5.0)
Anion gap: 9 (ref 5–15)
BUN: 39 mg/dL — ABNORMAL HIGH (ref 8–23)
CO2: 27 mmol/L (ref 22–32)
Calcium: 8.5 mg/dL — ABNORMAL LOW (ref 8.9–10.3)
Chloride: 102 mmol/L (ref 98–111)
Creatinine, Ser: 0.89 mg/dL (ref 0.61–1.24)
GFR, Estimated: >60 mL/min (ref >60)
Glucose, Bld: 110 mg/dL — ABNORMAL HIGH (ref 70–99)
Phosphorus: 2 mg/dL — ABNORMAL LOW (ref 2.5–4.6)
Potassium: 4.4 mmol/L (ref 3.5–5.1)
Sodium: 138 mmol/L (ref 135–145)

## 2021-06-20 LAB — GLUCOSE, CAPILLARY
Glucose-Capillary: 105 mg/dL — ABNORMAL HIGH (ref 70–99)
Glucose-Capillary: 116 mg/dL — ABNORMAL HIGH (ref 70–99)
Glucose-Capillary: 120 mg/dL — ABNORMAL HIGH (ref 70–99)
Glucose-Capillary: 123 mg/dL — ABNORMAL HIGH (ref 70–99)
Glucose-Capillary: 134 mg/dL — ABNORMAL HIGH (ref 70–99)
Glucose-Capillary: 84 mg/dL (ref 70–99)
Glucose-Capillary: 97 mg/dL (ref 70–99)

## 2021-06-20 LAB — MAGNESIUM: Magnesium: 2 mg/dL (ref 1.7–2.4)

## 2021-06-20 NOTE — Progress Notes (Addendum)
?PROGRESS NOTE ? ?LEEON RADON HYQ:[id number] DOB: 12-25-1940  ? ?PCP: Floreen Comber, PA-C ? ?Patient is from: SNF ? ?DOA: 06/16/2021 LOS: 4 ? ?Chief complaints ?Chief Complaint  ?Patient presents with  ? Fall  ?  ? ?Brief Narrative / Interim history: ?81 year old M with PMH of cognitive impairment, COPD, chronic dysphagia/aspiration on TF via PEG tube, anemia, PAD/carotid artery stenosis s/p right transcarotid artery revascularization in 02/2021, anxiety, depression, and ORIF of periprosthetic right femoral fracture on 05/21/2021 presenting with left hip pain after unwitnessed fall at [facility].  CT head and cervical spine without acute finding.  X-ray showed intertrochanteric left femoral fracture.  ? ?Patient underwent intramedullary nailing on 06/16/2021.  Postop, Hgb dropped to 6.4 (baseline 7-8).  Transfused 2 units with appropriate response.  Received IV iron as well.  Plavix and aspirin resumed. HH stable. ? ?Medically optimized for discharge to SNF pending insurance authorization.  ? ?Subjective: ?Seen and examined earlier this morning.  No major events overnight of this morning.  No complaints but not a great historian.  He is awake, alert and oriented to self and place only.  He denies pain or shortness of breath. ? ?Objective: ?Vitals:  ? 06/19/21 2029 06/20/21 0300 06/20/21 0738 06/20/21 1139  ?BP:  (!) 117/51  (!) 109/50  ?Pulse:  86  70  ?Resp:  20  19  ?Temp:  98 ?F (36.7 ?C)  (!) 97.4 ?F (36.3 ?C)  ?TempSrc:  Oral  Oral  ?SpO2: 95% 97% 96% 98%  ?Weight:      ?Height:      ? ? ?Examination: ? ?GENERAL: Appears frail.  Nontoxic. ?HEENT: MMM.  Vision and hearing grossly intact.  ?NECK: Supple.  No apparent JVD.  ?RESP:  No IWOB.  Fair aeration bilaterally. ?CVS:  RRR. Heart sounds normal.  ?ABD/GI/GU: BS+. Abd soft, NTND.  ?MSK/EXT:  Moves extremities.  Significant muscle mass and subcu fat loss. ?SKIN: Dressing over left thigh DCI. ?NEURO: Awake and alert. Oriented appropriately.  No apparent focal  neuro deficit. ?PSYCH: Calm. Normal affect.  ? ?Procedures:  ?5/10-intramedullary nailing of left intertrochanteric femoral fracture ? ?Microbiology summarized: ?None ? ?Assessment and Plan: ?* Closed fracture of left hip likely due to unwitnessed fall at nursing home ?X-ray showed intertrochanteric left femoral bone fracture. ?-S/p intramedullary nailing by Dr. Blanchie Dessert on 5/10. ?-WBAT as tolerated on LLE ?-20% PWB RLE, posterior hip precautions x6 weeks,  ?-AFO for R foot drop, please keep heel off of bed ?-Subcu Lovenox for VTE prophylaxis for 4-week ?-Outpatient follow-up in 2 weeks for wound check ?-Pain control per orthopedic surgery. ?-PT/OT ? ?Iron deficiency anemia ?Recent Labs  ?  06/01/21 ?0231 06/10/21 ?0406 06/11/21 ?0456 06/16/21 ?0326 06/17/21 ?0501 06/17/21 ?1140 06/18/21 ?0431 06/18/21 ?2113 06/19/21 ?9528 06/20/21 ?4132  ?HGB 8.6* 8.9* 7.9* 9.1* 7.1* 6.4* 7.1* 8.2* 9.0* 8.9*  ?Some drop in Hgb postop.  No obvious bleeding at surgical site or from GI.  Anemia panel with some degree of iron deficiency.  Transfused 2 units with appropriate response. HH stable ?-IV iron on 5/12 ?-Continue monitoring ? ?Cognitive impairment ?Seems to have some degree of cognitive impairment.  He is awake and alert but only oriented to self and place Not oriented to time.  Follows commands. ?-Reorientation and delirium precaution ?-Continue home Seroquel ? ?Goals of care, counseling/discussion ?Remains full code as of 5/14 per patient's wife.  She has not talked to his sister yet.  See GOC discussion with patient's wife on 5/11.  ?-Palliative follow-up  at SNF ? ?Chronic obstructive pulmonary disease (HCC) ?Stable. ?-Continue nebulizers ? ?Dysphagia ?On tube feed per PEG tube ?-TF per dietitian. ? ?Protein-calorie malnutrition, severe (HCC) ?Nutrition Status: ?Nutrition Problem: Severe Malnutrition ?Etiology: chronic illness ?Signs/Symptoms: severe fat depletion, severe muscle depletion ?Interventions: Tube feeding,  Prostat, Juven ? ?PVD and carotid artery stenosis s/p right transcarotid artery revascularization ?On aspirin, Plavix and Lipitor at home. ?-Continue Lipitor, Plavix and aspirin ? ?H/O total right hip arthroplasty ?S/p recent right periprosthetic femoral fracture repair on 05/21/2021.  Stable. ?-20% PWB RLE, posterior hip precautions x6 weeks,  ?-AFO for R foot drop, please keep heel off of bed ? ?Pulmonary infiltrates ?CT neck showed new LUL.  He was started on ceftriaxone and azithromycin but no respiratory symptoms..   Procalcitonin might not be reliable after orthopedic surgery. ?-Stable off antibiotics. ? ?HTN (hypertension) ?Slightly hypotensive. ?-Continue home midodrine. ? ?Anxiety and depression ?Stable.  Continue home medication ? ?Pressure skin injury: POA ?Pressure Injury Heel Posterior;Right Stage 1 -  Intact skin with non-blanchable redness of a localized area usually over a bony prominence. previous stage 1 with large blister now (Active)  ?   ?Location: Heel  ?Location Orientation: Posterior;Right  ?Staging: Stage 1 -  Intact skin with non-blanchable redness of a localized area usually over a bony prominence.  ?Wound Description (Comments): previous stage 1 with large blister now  ?Present on Admission: Yes  ?Dressing Type Impregnated gauze (petrolatum) 06/20/21 0925  ?   ?Pressure Injury 06/10/21 Foot Anterior;Left Unstageable - Full thickness tissue loss in which the base of the injury is covered by slough (yellow, tan, gray, green or brown) and/or eschar (tan, brown or black) in the wound bed. small area with  scab (Active)  ?06/10/21 1319  ?Location: Foot  ?Location Orientation: Anterior;Left  ?Staging: Unstageable - Full thickness tissue loss in which the base of the injury is covered by slough (yellow, tan, gray, green or brown) and/or eschar (tan, brown or black) in the wound bed.  ?Wound Description (Comments): small area with  scab  ?Present on Admission: Yes  ?Dressing Type Impregnated gauze  (petrolatum) 06/20/21 0925  ? ?DVT prophylaxis:  ?enoxaparin (LOVENOX) injection 40 mg Start: 06/19/21 1600 ?Place and maintain sequential compression device Start: 06/17/21 1333 ?SCDs Start: 06/16/21 2841 ? ?Code Status: Full code ?Family Communication: Updated patient's wife over the phone. ?Level of care: Med-Surg.  ?Status is: Inpatient ?Remains inpatient appropriate because: Safe disposition/SNF ? ? ?Final disposition: SNF ?Consultants:  ?Orthopedic surgery ? ?Sch Meds:  ?Scheduled Meds: ? arformoterol  15 mcg Nebulization BID  ? vitamin C  250 mg Per Tube BID  ? aspirin  81 mg Per Tube Daily  ? atorvastatin  80 mg Per Tube QHS  ? budesonide (PULMICORT) nebulizer solution  0.25 mg Nebulization BID  ? chlorhexidine  15 mL Mouth Rinse BID  ? clopidogrel  75 mg Per Tube Daily  ? enoxaparin (LOVENOX) injection  40 mg Subcutaneous Q24H  ? feeding supplement (OSMOLITE 1.5 CAL)  237 mL Per Tube 6 X Daily  ? feeding supplement (PROSource TF)  45 mL Per Tube BID  ? free water  120 mL Per Tube 6 X Daily  ? Gerhardt's butt cream   Topical QID  ? mouth rinse  15 mL Mouth Rinse q12n4p  ? melatonin  3 mg Per Tube QHS  ? metoprolol tartrate  12.5 mg Per Tube Daily  ? midodrine  2.5 mg Per Tube TID WC  ? multivitamin with minerals  1  tablet Per Tube Daily  ? nutrition supplement (JUVEN)  1 packet Per Tube BID BM  ? pantoprazole sodium  40 mg Per Tube Daily  ? polyethylene glycol  17 g Per Tube BID  ? QUEtiapine  25 mg Per Tube Daily  ? And  ? QUEtiapine  50 mg Per Tube QHS  ? revefenacin  175 mcg Nebulization Daily  ? senna-docusate  1 tablet Per Tube BID  ? sertraline  200 mg Per Tube Daily  ? zinc sulfate  220 mg Per Tube Daily  ? ?Continuous Infusions: ? methocarbamol (ROBAXIN) IV 500 mg (06/17/21 0537)  ? ?PRN Meds:.acetaminophen, albuterol, bisacodyl, fentaNYL (SUBLIMAZE) injection, guaiFENesin-dextromethorphan, methocarbamol **OR** methocarbamol (ROBAXIN) IV, ondansetron (ZOFRAN) IV,  oxyCODONE ? ?Antimicrobials: ?Anti-infectives (From admission, onward)  ? ? Start     Dose/Rate Route Frequency Ordered Stop  ? 06/16/21 1702  vancomycin (VANCOCIN) powder  Status:  Discontinued       ?   As needed 06/16/21 1703 06/16/21 18

## 2021-06-20 NOTE — Progress Notes (Signed)
? ? ? ?  Subjective: ? ?Patient doing well this morning. Lying comfortably in bed. Pain well controlled. Hx limited by dementia. Per PT notes was able to stand yesterday but fatigues easily. PRAFO boot is on RLE. ? ?Objective:  ? ?VITALS:   ?Vitals:  ? 06/19/21 1126 06/19/21 1924 06/19/21 2029 06/20/21 0300  ?BP: (!) 112/57 (!) 135/56  (!) 117/51  ?Pulse: 78 86  86  ?Resp: '18 20  20  '$ ?Temp: (!) 97.5 ?F (36.4 ?C) 98.9 ?F (37.2 ?C)  98 ?F (36.7 ?C)  ?TempSrc: Oral Oral  Oral  ?SpO2: 92% 95% 95% 97%  ?Weight:      ?Height:      ? ?LLE ?Intact pulses distally ?Dorsiflexion/Plantar flexion intact ?Incision: dressing C/D/I ?Compartment soft ? ?RLE ?Intact pulses distally ?Plantar flexion intact, no active dorsiflexion ?Incision: dressing C/D/I ?Compartment soft ? ?Lab Results  ?Component Value Date  ? WBC 10.4 06/20/2021  ? HGB 8.9 (L) 06/20/2021  ? HCT 28.7 (L) 06/20/2021  ? MCV 89.4 06/20/2021  ? PLT 285 06/20/2021  ? ?BMET ?   ?Component Value Date/Time  ? NA 138 06/20/2021 0520  ? K 4.4 06/20/2021 0520  ? CL 102 06/20/2021 0520  ? CO2 27 06/20/2021 0520  ? GLUCOSE 110 (H) 06/20/2021 0520  ? BUN 39 (H) 06/20/2021 0520  ? CREATININE 0.89 06/20/2021 0520  ? CALCIUM 8.5 (L) 06/20/2021 0520  ? GFRNONAA >60 06/20/2021 0520  ? ? ?Xray: Postop x-rays demonstrate left intertrochanteric femur fracture and improved alignment hardware intact without adverse features ? ?Assessment/Plan: ?4 Days Post-Op  ? ?Principal Problem: ?  Closed fracture of left hip likely due to unwitnessed fall at nursing home ?Active Problems: ?  H/O total right hip arthroplasty ?  PVD and carotid artery stenosis s/p right transcarotid artery revascularization ?  Protein-calorie malnutrition, severe ([place]) ?  Anxiety and depression ?  HTN (hypertension) ?  Dysphagia ?  Chronic obstructive pulmonary disease (HCC) ?  Pulmonary infiltrates ?  Pressure injury of skin ?  Iron deficiency anemia ?  Goals of care, counseling/discussion ?  Fall at nursing home ?   Cognitive impairment ? ?S/p revision THA for periprosthetic femur fracture 4/14 ?Status post CMN left intertrochanteric femur fracture 5/10 ? ?Post op recs: ?WB: WBAT LLE, : 20% PWB RLE, posterior hip precautions x6 weeks, AFO for R foot drop, please keep heel off of bed ?Imaging: PACU xrays ?Dressing: keep intact until follow up, change PRN if soiled or saturated. ?DVT prophylaxis: lovenox starting POD1 x4 weeks ?Follow up: 2 weeks after surgery for a wound check with Dr. Zachery Dakins at [facility].  ?Address: [address] [place], [place], [place] [address]  ?Office Phone: [phone] ? ? ?Darreon Lutes A Cornelius Marullo ?06/20/2021, 7:00 AM ? ? ?Charlies Constable, MD ? ?Contact information:   ?Weekdays 7am-5pm epic message Dr. Zachery Dakins, or call office for patient follow up: [phone] ?After hours and holidays please check [email or web address] for group call information for [facility] ? ?  ?

## 2021-06-21 DIAGNOSIS — S065XAA Traumatic subdural hemorrhage with loss of consciousness status unknown, initial encounter: Secondary | ICD-10-CM | POA: Diagnosis not present

## 2021-06-21 DIAGNOSIS — Z9889 Other specified postprocedural states: Secondary | ICD-10-CM | POA: Diagnosis not present

## 2021-06-21 DIAGNOSIS — R262 Difficulty in walking, not elsewhere classified: Secondary | ICD-10-CM | POA: Diagnosis not present

## 2021-06-21 DIAGNOSIS — Z7401 Bed confinement status: Secondary | ICD-10-CM | POA: Diagnosis not present

## 2021-06-21 DIAGNOSIS — Z931 Gastrostomy status: Secondary | ICD-10-CM | POA: Diagnosis not present

## 2021-06-21 DIAGNOSIS — J449 Chronic obstructive pulmonary disease, unspecified: Secondary | ICD-10-CM | POA: Diagnosis not present

## 2021-06-21 DIAGNOSIS — W19XXXA Unspecified fall, initial encounter: Secondary | ICD-10-CM | POA: Diagnosis not present

## 2021-06-21 DIAGNOSIS — I129 Hypertensive chronic kidney disease with stage 1 through stage 4 chronic kidney disease, or unspecified chronic kidney disease: Secondary | ICD-10-CM | POA: Diagnosis not present

## 2021-06-21 DIAGNOSIS — D72829 Elevated white blood cell count, unspecified: Secondary | ICD-10-CM | POA: Diagnosis not present

## 2021-06-21 DIAGNOSIS — L89616 Pressure-induced deep tissue damage of right heel: Secondary | ICD-10-CM | POA: Diagnosis not present

## 2021-06-21 DIAGNOSIS — N182 Chronic kidney disease, stage 2 (mild): Secondary | ICD-10-CM | POA: Diagnosis not present

## 2021-06-21 DIAGNOSIS — N179 Acute kidney failure, unspecified: Secondary | ICD-10-CM | POA: Diagnosis not present

## 2021-06-21 DIAGNOSIS — Z79899 Other long term (current) drug therapy: Secondary | ICD-10-CM | POA: Diagnosis not present

## 2021-06-21 DIAGNOSIS — R1312 Dysphagia, oropharyngeal phase: Secondary | ICD-10-CM | POA: Diagnosis not present

## 2021-06-21 DIAGNOSIS — S72002D Fracture of unspecified part of neck of left femur, subsequent encounter for closed fracture with routine healing: Secondary | ICD-10-CM | POA: Diagnosis not present

## 2021-06-21 DIAGNOSIS — E46 Unspecified protein-calorie malnutrition: Secondary | ICD-10-CM | POA: Diagnosis not present

## 2021-06-21 DIAGNOSIS — D509 Iron deficiency anemia, unspecified: Secondary | ICD-10-CM

## 2021-06-21 DIAGNOSIS — I959 Hypotension, unspecified: Secondary | ICD-10-CM | POA: Diagnosis not present

## 2021-06-21 DIAGNOSIS — R41841 Cognitive communication deficit: Secondary | ICD-10-CM | POA: Diagnosis not present

## 2021-06-21 DIAGNOSIS — R2689 Other abnormalities of gait and mobility: Secondary | ICD-10-CM | POA: Diagnosis not present

## 2021-06-21 DIAGNOSIS — Z515 Encounter for palliative care: Secondary | ICD-10-CM | POA: Diagnosis not present

## 2021-06-21 DIAGNOSIS — Z7902 Long term (current) use of antithrombotics/antiplatelets: Secondary | ICD-10-CM | POA: Diagnosis not present

## 2021-06-21 DIAGNOSIS — M6250 Muscle wasting and atrophy, not elsewhere classified, unspecified site: Secondary | ICD-10-CM | POA: Diagnosis not present

## 2021-06-21 DIAGNOSIS — S72302D Unspecified fracture of shaft of left femur, subsequent encounter for closed fracture with routine healing: Secondary | ICD-10-CM | POA: Diagnosis not present

## 2021-06-21 DIAGNOSIS — I6200 Nontraumatic subdural hemorrhage, unspecified: Secondary | ICD-10-CM | POA: Diagnosis not present

## 2021-06-21 DIAGNOSIS — J479 Bronchiectasis, uncomplicated: Secondary | ICD-10-CM | POA: Diagnosis not present

## 2021-06-21 DIAGNOSIS — R4182 Altered mental status, unspecified: Secondary | ICD-10-CM | POA: Diagnosis not present

## 2021-06-21 DIAGNOSIS — L89893 Pressure ulcer of other site, stage 3: Secondary | ICD-10-CM | POA: Diagnosis not present

## 2021-06-21 DIAGNOSIS — I739 Peripheral vascular disease, unspecified: Secondary | ICD-10-CM | POA: Diagnosis not present

## 2021-06-21 DIAGNOSIS — Z8673 Personal history of transient ischemic attack (TIA), and cerebral infarction without residual deficits: Secondary | ICD-10-CM | POA: Diagnosis not present

## 2021-06-21 DIAGNOSIS — S72142D Displaced intertrochanteric fracture of left femur, subsequent encounter for closed fracture with routine healing: Secondary | ICD-10-CM | POA: Diagnosis not present

## 2021-06-21 DIAGNOSIS — R4189 Other symptoms and signs involving cognitive functions and awareness: Secondary | ICD-10-CM | POA: Diagnosis not present

## 2021-06-21 DIAGNOSIS — M93971 Osteochondropathy, unspecified, right ankle and foot: Secondary | ICD-10-CM | POA: Diagnosis not present

## 2021-06-21 DIAGNOSIS — R0603 Acute respiratory distress: Secondary | ICD-10-CM | POA: Diagnosis not present

## 2021-06-21 DIAGNOSIS — G935 Compression of brain: Secondary | ICD-10-CM | POA: Diagnosis not present

## 2021-06-21 DIAGNOSIS — R1310 Dysphagia, unspecified: Secondary | ICD-10-CM | POA: Diagnosis not present

## 2021-06-21 DIAGNOSIS — S72302A Unspecified fracture of shaft of left femur, initial encounter for closed fracture: Secondary | ICD-10-CM | POA: Diagnosis not present

## 2021-06-21 DIAGNOSIS — Z7982 Long term (current) use of aspirin: Secondary | ICD-10-CM | POA: Diagnosis not present

## 2021-06-21 DIAGNOSIS — M9701XD Periprosthetic fracture around internal prosthetic right hip joint, subsequent encounter: Secondary | ICD-10-CM | POA: Diagnosis not present

## 2021-06-21 DIAGNOSIS — S7291XD Unspecified fracture of right femur, subsequent encounter for closed fracture with routine healing: Secondary | ICD-10-CM | POA: Diagnosis not present

## 2021-06-21 DIAGNOSIS — J9621 Acute and chronic respiratory failure with hypoxia: Secondary | ICD-10-CM | POA: Diagnosis not present

## 2021-06-21 DIAGNOSIS — J438 Other emphysema: Secondary | ICD-10-CM | POA: Diagnosis not present

## 2021-06-21 DIAGNOSIS — D649 Anemia, unspecified: Secondary | ICD-10-CM | POA: Diagnosis not present

## 2021-06-21 DIAGNOSIS — Z9181 History of falling: Secondary | ICD-10-CM | POA: Diagnosis not present

## 2021-06-21 DIAGNOSIS — Z96641 Presence of right artificial hip joint: Secondary | ICD-10-CM | POA: Diagnosis not present

## 2021-06-21 DIAGNOSIS — Z87898 Personal history of other specified conditions: Secondary | ICD-10-CM | POA: Diagnosis not present

## 2021-06-21 DIAGNOSIS — F419 Anxiety disorder, unspecified: Secondary | ICD-10-CM | POA: Diagnosis not present

## 2021-06-21 DIAGNOSIS — L89613 Pressure ulcer of right heel, stage 3: Secondary | ICD-10-CM | POA: Diagnosis not present

## 2021-06-21 DIAGNOSIS — M6281 Muscle weakness (generalized): Secondary | ICD-10-CM | POA: Diagnosis not present

## 2021-06-21 DIAGNOSIS — R2681 Unsteadiness on feet: Secondary | ICD-10-CM | POA: Diagnosis not present

## 2021-06-21 DIAGNOSIS — S72002A Fracture of unspecified part of neck of left femur, initial encounter for closed fracture: Secondary | ICD-10-CM | POA: Diagnosis not present

## 2021-06-21 LAB — GLUCOSE, CAPILLARY
Glucose-Capillary: 100 mg/dL — ABNORMAL HIGH (ref 70–99)
Glucose-Capillary: 129 mg/dL — ABNORMAL HIGH (ref 70–99)
Glucose-Capillary: 118 mg/dL — ABNORMAL HIGH (ref 70–99)

## 2021-06-21 MED ORDER — ENOXAPARIN SODIUM 40 MG/0.4ML IJ SOSY: 40.0000 mg | PREFILLED_SYRINGE | INTRAMUSCULAR | Status: AC

## 2021-06-21 MED ORDER — POLYETHYLENE GLYCOL 3350 17 G PO PACK: 17.0000 g | PACK | Freq: Two times a day (BID) | ORAL | 0 refills | Status: AC

## 2021-06-21 MED ORDER — ZINC SULFATE 220 (50 ZN) MG PO CAPS: 220.0000 mg | ORAL_CAPSULE | Freq: Every day | ORAL | Status: AC

## 2021-06-21 MED ORDER — SENNOSIDES-DOCUSATE SODIUM 8.6-50 MG PO TABS: 1.0000 | ORAL_TABLET | Freq: Two times a day (BID) | ORAL | Status: AC

## 2021-06-21 MED ORDER — ASCORBIC ACID 250 MG PO TABS: 250.0000 mg | ORAL_TABLET | Freq: Two times a day (BID) | ORAL | Status: AC

## 2021-06-21 MED ORDER — OXYCODONE HCL 5 MG/5ML PO SOLN: 5.0000 mg | Freq: Four times a day (QID) | ORAL | 0 refills | Status: AC | PRN

## 2021-06-21 MED ORDER — ACETAMINOPHEN 500 MG PO TABS: ORAL_TABLET | ORAL | 0 refills | Status: AC

## 2021-06-21 NOTE — Discharge Summary (Signed)
? ?Physician Discharge Summary  ?William Kirk XBM:[id number] DOB: 03-13-1940 DOA: 06/16/2021 ? ?PCP: William Comber, PA-C ? ?Admit date: 06/16/2021 ?Discharge date: 06/21/2021 ?Admitted From: SNF ?Disposition: SNF ?Recommendations for Outpatient Follow-up:  ?Follow ups as below. ?Please obtain CBC/BMP/Mag at follow up ?Recommend palliative follow-up at SNF. ?Please follow up on the following pending results: None ? ? ?Discharge Condition: Stable ?CODE STATUS: Full code ? Contact information for follow-up providers   ? ? William Laura, MD Follow up in 2 week(s).   ?Specialty: Orthopedic Surgery ?Contact information: ?[address] [employer] ?[address] ?[place] [address] ?[phone] ? ? ?  ?  ? ?  ?  ? ? Contact information for after-discharge care   ? ? Destination   ? ? [facility] .   ?Service: Skilled Nursing ?Contact information: ?[address] ?[place] [address] ?[phone] ? ?  ?  ? ?  ?  ? ?  ?  ? ?  ? ? 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  CT head and cervical spine without acute finding.  X-ray showed intertrochanteric left femoral fracture.  ? ?Patient underwent intramedullary nailing on 06/16/2021.  Postop, Hgb dropped to 6.4 (baseline 7-8).  Transfused 2 units with appropriate response.  Received IV iron as well.  Plavix and aspirin resumed. HH stable. ? ?Medically optimized for discharge to SNF pending insurance authorization.  ? ?See individual problem list below for more on hospital course. ? ?Problems addressed during this hospitalization ?Problem  ?Closed fracture of left hip likely due to unwitnessed fall at nursing home  ?Iron  Deficiency Anemia  ?Goals of Care, Counseling/Discussion  ?Cognitive Impairment  ?Chronic Obstructive Pulmonary Disease (Hcc)  ? Quit smoking  04/2015 ?- Spirometry 11/07/2016  FEV1 2.04 (66%)  Ratio 61 with typical curvature p am spiriva/symb ?- 11/07/2016  After extensive coaching HFA effectiveness =    75% from a baseline of 50% > continue symb 160 and d/c spiriva mdi on a trial basis   ? ?  ?Dysphagia  ?PVD and carotid artery stenosis s/p right transcarotid artery revascularization  ?Protein-Calorie Malnutrition, Severe (Hcc)  ?H/O total right hip arthroplasty  ?Pulmonary Infiltrates  ?Anxiety and Depression  ?Htn (Hypertension)  ?  ?Assessment and Plan: ?* Closed fracture of left hip likely due to unwitnessed fall at nursing home ?X-ray showed intertrochanteric left femoral bone fracture. ?-S/p intramedullary nailing by Dr. Blanchie Kirk on 5/10. ?-WBAT as tolerated on LLE ?-20% PWB RLE, posterior hip precautions x6 weeks,  ?-AFO for R foot drop, please keep heel off of bed ?-Subcu Lovenox for VTE prophylaxis for 4-week ?-Outpatient follow-up in 2 weeks for wound check ?-Pain control per orthopedic surgery. ?-PT/OT ? ?Iron deficiency anemia ?Recent Labs  ?  06/01/21 ?0231 06/10/21 ?0406 06/11/21 ?0456 06/16/21 ?0326 06/17/21 ?0501 06/17/21 ?1140 06/18/21 ?0431 06/18/21 ?2113 06/19/21 ?5188 06/20/21 ?4166  ?HGB 8.6* 8.9* 7.9* 9.1* 7.1* 6.4* 7.1* 8.2* 9.0* 8.9*  ?Some drop in Hgb postop.  No obvious bleeding at surgical site or from GI.  Anemia panel with some degree of iron deficiency.  Transfused 2 units with appropriate response.  Received IV ferric gluconate 250 mg on 5/10.  HH stable ?-Continue ferrous sulfate ?-Recheck CBC in about a week ? ?  Cognitive impairment ?Seems to have some degree of cognitive impairment.  He is awake and alert but only oriented to self and place Not oriented to time.  Follows commands. ?-Reorientation and delirium precaution ?-Continue home Seroquel ? ?Goals of care,  counseling/discussion ?Remains full code as of 5/14 per patient's wife.  She has not talked to his sister yet.  See GOC discussion with patient's wife on 5/11.  ?-Palliative follow-up at SNF ? ?Chronic obstructive pulmonary disease (HCC) ?Stable. ?-Continue nebulizers ? ?Dysphagia ?On tube feed per PEG tube ?-TF per dietitian. ? ?Protein-calorie malnutrition, severe (HCC) ?Nutrition Status: ?Nutrition Problem: Severe Malnutrition ?Etiology: chronic illness ?Signs/Symptoms: severe fat depletion, severe muscle depletion ?Interventions: Tube feeding, Prostat, Juven ? ?PVD and carotid artery stenosis s/p right transcarotid artery revascularization ?On aspirin, Plavix and Lipitor at home. ?-Continue Lipitor, Plavix and aspirin ? ?H/O total right hip arthroplasty ?S/p recent right periprosthetic femoral fracture repair on 05/21/2021.  Stable. ?-20% PWB RLE, posterior hip precautions x6 weeks,  ?-AFO for R foot drop, please keep heel off of bed ? ?Pulmonary infiltrates ?CT neck showed new LUL.  He was started on ceftriaxone and azithromycin but no respiratory symptoms..   Procalcitonin might not be reliable after orthopedic surgery. ?-Stable off antibiotics. ? ?HTN (hypertension) ?Slightly hypotensive. ?-Continue home midodrine. ? ?Anxiety and depression ?Stable.  Continue home medication ? ? ?Pressure skin injury: POA ?Pressure Injury Heel Posterior;Right Stage 1 -  Intact skin with non-blanchable redness of a localized area usually over a bony prominence. previous stage 1 with large blister now (Active)  ?   ?Location: Heel  ?Location Orientation: Posterior;Right  ?Staging: Stage 1 -  Intact skin with non-blanchable redness of a localized area usually over a bony prominence.  ?Wound Description (Comments): previous stage 1 with large blister now  ?Present on Admission: Yes  ?Dressing Type Impregnated gauze (petrolatum) 06/20/21 2059  ?   ?Pressure Injury 06/10/21 Foot Anterior;Left Unstageable - Full thickness tissue loss  in which the base of the injury is covered by slough (yellow, tan, gray, green or brown) and/or eschar (tan, brown or black) in the wound bed. small area with  scab (Active)  ?06/10/21 1319  ?Location: Foot  ?Location Orientation: Anterior;Left  ?Staging: Unstageable - Full thickness tissue loss in which the base of the injury is covered by slough (yellow, tan, gray, green or brown) and/or eschar (tan, brown or black) in the wound bed.  ?Wound Description (Comments): small area with  scab  ?Present on Admission: Yes  ?Dressing Type Impregnated gauze (petrolatum) 06/20/21 2059  ? ? ?Vital signs ?Vitals:  ? 06/20/21 1917 06/20/21 2030 06/21/21 0340 06/21/21 0342  ?BP: (!) 143/53  (!) 143/56   ?Pulse: 70  71   ?Temp: 98.6 ?F (37 ?C)  97.6 ?F (36.4 ?C)   ?Resp: (!) 22  20   ?Height:      ?Weight:    67.8 kg  ?SpO2: 94% 96% 96%   ?TempSrc:   Oral   ?BMI (Calculated):    20.27  ?  ? ?Discharge exam ? ?GENERAL: Appears frail.  Nontoxic. ?HEENT: MMM.  Vision and hearing grossly intact.  ?NECK: Supple.  No apparent JVD.  ?RESP:  No IWOB.  Fair aeration bilaterally. ?CVS:  RRR. Heart sounds normal.  ?ABD/GI/GU: BS+. Abd soft, NTND.  ?MSK/EXT:  Moves extremities.  Significant muscle mass and subcu fat loss. ?SKIN: Dressing over left hip DCI. ?NEURO: Awake and alert. Oriented to self and place.  Follows commands.  No apparent  focal neuro deficit. ?PSYCH: Calm. Normal affect.  ? ?Discharge Instructions ?Discharge Instructions   ? ? Discharge wound care:   Complete by: As directed ?  ? Wound care to left anterior foot and right heel:  Cleanse with soap and water, rinse with NS and dry. Cover with size appropriate piece of xeroform gauze, top with dry gauze, secure with Kerlix roll gauze/paper tape. Place feet into Prevalon Boots  ? Increase activity slowly   Complete by: As directed ?  ? ?  ? ?Allergies as of 06/21/2021   ? ?   Reactions  ? Testosterone Rash  ? ?  ? ?  ?Medication List  ?  ? ?STOP taking these medications    ? ?Ferrous Gluconate 324 (37.5 Fe) MG Tabs ?  ?oxyCODONE-acetaminophen 5-325 MG tablet ?Commonly known as: PERCOCET/ROXICET ?  ? ?  ? ?TAKE these medications   ? ?acetaminophen 500 MG tablet ?Commonly known as: TYLENOL ?Place 2 table

## 2021-06-21 NOTE — Progress Notes (Signed)
Patient discharge back to [facility] via ambulance, patient alert and in no distress, C/O leg pain and PRN pain med given earlier before discharge. Surgical dressing on left hip, clean/dry/intact, bruises on left hip/thigh area , dressing change on left and right leg/feet, no sign on infection.No pressure injury noted other than MASD due to incontinence  Called the rehab facility 4 times to give report but unable to speak to the nurse, number left  for facility nurse to call back. Patient's wife called and informed that ambulance is transporting patient back to rehab facility.   ?

## 2021-06-21 NOTE — TOC Transition Note (Addendum)
Transition of Care (TOC) - CM/SW Discharge Note ? ? ?Patient Details  ?Name: William Kirk ?MRN: [id number] ?Date of Birth: 1940/11/23 ? ?Transition of Care [facility]) CM/SW Contact:  ?Leeroy Cha, RN ?Phone Number: ?06/21/2021, 10:52 AM ? ? ?Clinical Narrative:    ?Dcd to return to [facility].  [facility] for bed and report number.  Room 507P and call main number for report. ? ? ?Final next level of care: [place] ?Barriers to Discharge: No Barriers Identified ? ? ?Patient Goals and CMS Choice ?Patient states their goals for this hospitalization and ongoing recovery are:: go back to [facility] ?CMS Medicare.gov Compare Post Acute Care list provided to:: Patient Represenative (must comment) (spouse) ?Choice offered to / list presented to : Spouse ? ?Discharge Placement ?  ?           ?  ?  ?  ?  ? ?Discharge Plan and Services ?  ?Discharge Planning Services: CM Consult ?Post Acute Care Choice: [place]          ?  ?  ?  ?  ?  ?  ?  ?  ?  ?  ? ?Social Determinants of Health (SDOH) Interventions ?  ? ? ?Readmission Risk Interventions ? ?  02/25/2021  ?  2:25 PM 11/08/2018  ? 11:19 AM 10/23/2018  ?  8:44 AM  ?Readmission Risk Prevention Plan  ?Post Dischage Appt Complete    ?Medication Screening Complete    ?Transportation Screening Complete Complete Complete  ?PCP or Specialist Appt within 3-5 Days  Complete Complete  ?[place] or Home Care Consult  Complete Complete  ?Social Work Consult for [place] Planning/Counseling  Complete Complete  ?Palliative Care Screening  Not Applicable Not Applicable  ?Medication Review [employer])  Complete Complete  ? ? ? ? ? ?

## 2021-06-22 DIAGNOSIS — L89893 Pressure ulcer of other site, stage 3: Secondary | ICD-10-CM | POA: Diagnosis not present

## 2021-06-22 DIAGNOSIS — L89616 Pressure-induced deep tissue damage of right heel: Secondary | ICD-10-CM | POA: Diagnosis not present

## 2021-06-23 DIAGNOSIS — Z9181 History of falling: Secondary | ICD-10-CM | POA: Diagnosis not present

## 2021-06-23 DIAGNOSIS — J9621 Acute and chronic respiratory failure with hypoxia: Secondary | ICD-10-CM | POA: Diagnosis not present

## 2021-06-23 DIAGNOSIS — I739 Peripheral vascular disease, unspecified: Secondary | ICD-10-CM | POA: Diagnosis not present

## 2021-06-23 DIAGNOSIS — M6250 Muscle wasting and atrophy, not elsewhere classified, unspecified site: Secondary | ICD-10-CM | POA: Diagnosis not present

## 2021-06-23 DIAGNOSIS — S7291XD Unspecified fracture of right femur, subsequent encounter for closed fracture with routine healing: Secondary | ICD-10-CM | POA: Diagnosis not present

## 2021-06-23 DIAGNOSIS — E46 Unspecified protein-calorie malnutrition: Secondary | ICD-10-CM | POA: Diagnosis not present

## 2021-06-23 DIAGNOSIS — R41841 Cognitive communication deficit: Secondary | ICD-10-CM | POA: Diagnosis not present

## 2021-06-23 DIAGNOSIS — R2681 Unsteadiness on feet: Secondary | ICD-10-CM | POA: Diagnosis not present

## 2021-06-23 DIAGNOSIS — M93971 Osteochondropathy, unspecified, right ankle and foot: Secondary | ICD-10-CM | POA: Diagnosis not present

## 2021-06-24 DIAGNOSIS — Z9889 Other specified postprocedural states: Secondary | ICD-10-CM | POA: Diagnosis not present

## 2021-06-24 DIAGNOSIS — S7291XD Unspecified fracture of right femur, subsequent encounter for closed fracture with routine healing: Secondary | ICD-10-CM | POA: Diagnosis not present

## 2021-06-24 DIAGNOSIS — R41841 Cognitive communication deficit: Secondary | ICD-10-CM | POA: Diagnosis not present

## 2021-06-24 DIAGNOSIS — R4189 Other symptoms and signs involving cognitive functions and awareness: Secondary | ICD-10-CM | POA: Diagnosis not present

## 2021-06-24 DIAGNOSIS — S72302A Unspecified fracture of shaft of left femur, initial encounter for closed fracture: Secondary | ICD-10-CM | POA: Diagnosis not present

## 2021-06-24 DIAGNOSIS — R1310 Dysphagia, unspecified: Secondary | ICD-10-CM | POA: Diagnosis not present

## 2021-06-24 DIAGNOSIS — J479 Bronchiectasis, uncomplicated: Secondary | ICD-10-CM | POA: Diagnosis not present

## 2021-06-24 DIAGNOSIS — Z9181 History of falling: Secondary | ICD-10-CM | POA: Diagnosis not present

## 2021-06-24 DIAGNOSIS — D649 Anemia, unspecified: Secondary | ICD-10-CM | POA: Diagnosis not present

## 2021-06-24 DIAGNOSIS — S72142D Displaced intertrochanteric fracture of left femur, subsequent encounter for closed fracture with routine healing: Secondary | ICD-10-CM | POA: Diagnosis not present

## 2021-06-24 DIAGNOSIS — Z87898 Personal history of other specified conditions: Secondary | ICD-10-CM | POA: Diagnosis not present

## 2021-06-24 DIAGNOSIS — Z931 Gastrostomy status: Secondary | ICD-10-CM | POA: Diagnosis not present

## 2021-06-24 DIAGNOSIS — J9621 Acute and chronic respiratory failure with hypoxia: Secondary | ICD-10-CM | POA: Diagnosis not present

## 2021-06-24 DIAGNOSIS — M6250 Muscle wasting and atrophy, not elsewhere classified, unspecified site: Secondary | ICD-10-CM | POA: Diagnosis not present

## 2021-06-24 DIAGNOSIS — R2681 Unsteadiness on feet: Secondary | ICD-10-CM | POA: Diagnosis not present

## 2021-06-24 DIAGNOSIS — I739 Peripheral vascular disease, unspecified: Secondary | ICD-10-CM | POA: Diagnosis not present

## 2021-06-24 DIAGNOSIS — E46 Unspecified protein-calorie malnutrition: Secondary | ICD-10-CM | POA: Diagnosis not present

## 2021-06-24 DIAGNOSIS — N179 Acute kidney failure, unspecified: Secondary | ICD-10-CM | POA: Diagnosis not present

## 2021-06-24 DIAGNOSIS — M93971 Osteochondropathy, unspecified, right ankle and foot: Secondary | ICD-10-CM | POA: Diagnosis not present

## 2021-06-25 ENCOUNTER — Ambulatory Visit: Payer: No Typology Code available for payment source | Admitting: Pulmonary Disease

## 2021-06-28 DIAGNOSIS — J9621 Acute and chronic respiratory failure with hypoxia: Secondary | ICD-10-CM | POA: Diagnosis not present

## 2021-06-28 DIAGNOSIS — Z9181 History of falling: Secondary | ICD-10-CM | POA: Diagnosis not present

## 2021-06-28 DIAGNOSIS — R2681 Unsteadiness on feet: Secondary | ICD-10-CM | POA: Diagnosis not present

## 2021-06-28 DIAGNOSIS — S7291XD Unspecified fracture of right femur, subsequent encounter for closed fracture with routine healing: Secondary | ICD-10-CM | POA: Diagnosis not present

## 2021-06-28 DIAGNOSIS — I739 Peripheral vascular disease, unspecified: Secondary | ICD-10-CM | POA: Diagnosis not present

## 2021-06-28 DIAGNOSIS — R41841 Cognitive communication deficit: Secondary | ICD-10-CM | POA: Diagnosis not present

## 2021-06-28 DIAGNOSIS — M6250 Muscle wasting and atrophy, not elsewhere classified, unspecified site: Secondary | ICD-10-CM | POA: Diagnosis not present

## 2021-06-28 DIAGNOSIS — E46 Unspecified protein-calorie malnutrition: Secondary | ICD-10-CM | POA: Diagnosis not present

## 2021-06-28 DIAGNOSIS — M93971 Osteochondropathy, unspecified, right ankle and foot: Secondary | ICD-10-CM | POA: Diagnosis not present

## 2021-06-29 DIAGNOSIS — D72829 Elevated white blood cell count, unspecified: Secondary | ICD-10-CM | POA: Diagnosis not present

## 2021-06-29 DIAGNOSIS — S72302D Unspecified fracture of shaft of left femur, subsequent encounter for closed fracture with routine healing: Secondary | ICD-10-CM | POA: Diagnosis not present

## 2021-06-29 DIAGNOSIS — L89613 Pressure ulcer of right heel, stage 3: Secondary | ICD-10-CM | POA: Diagnosis not present

## 2021-06-29 DIAGNOSIS — R1310 Dysphagia, unspecified: Secondary | ICD-10-CM | POA: Diagnosis not present

## 2021-06-29 DIAGNOSIS — J449 Chronic obstructive pulmonary disease, unspecified: Secondary | ICD-10-CM | POA: Diagnosis not present

## 2021-06-29 DIAGNOSIS — D649 Anemia, unspecified: Secondary | ICD-10-CM | POA: Diagnosis not present

## 2021-06-29 DIAGNOSIS — M9701XD Periprosthetic fracture around internal prosthetic right hip joint, subsequent encounter: Secondary | ICD-10-CM | POA: Diagnosis not present

## 2021-06-30 DIAGNOSIS — J9621 Acute and chronic respiratory failure with hypoxia: Secondary | ICD-10-CM | POA: Diagnosis not present

## 2021-06-30 DIAGNOSIS — S7291XD Unspecified fracture of right femur, subsequent encounter for closed fracture with routine healing: Secondary | ICD-10-CM | POA: Diagnosis not present

## 2021-06-30 DIAGNOSIS — M6250 Muscle wasting and atrophy, not elsewhere classified, unspecified site: Secondary | ICD-10-CM | POA: Diagnosis not present

## 2021-06-30 DIAGNOSIS — R41841 Cognitive communication deficit: Secondary | ICD-10-CM | POA: Diagnosis not present

## 2021-06-30 DIAGNOSIS — Z9181 History of falling: Secondary | ICD-10-CM | POA: Diagnosis not present

## 2021-06-30 DIAGNOSIS — R2681 Unsteadiness on feet: Secondary | ICD-10-CM | POA: Diagnosis not present

## 2021-06-30 DIAGNOSIS — M93971 Osteochondropathy, unspecified, right ankle and foot: Secondary | ICD-10-CM | POA: Diagnosis not present

## 2021-06-30 DIAGNOSIS — I739 Peripheral vascular disease, unspecified: Secondary | ICD-10-CM | POA: Diagnosis not present

## 2021-06-30 DIAGNOSIS — E46 Unspecified protein-calorie malnutrition: Secondary | ICD-10-CM | POA: Diagnosis not present

## 2021-07-01 ENCOUNTER — Ambulatory Visit: Payer: No Typology Code available for payment source | Admitting: Pulmonary Disease

## 2021-07-01 DIAGNOSIS — R2681 Unsteadiness on feet: Secondary | ICD-10-CM | POA: Diagnosis not present

## 2021-07-01 DIAGNOSIS — S7291XD Unspecified fracture of right femur, subsequent encounter for closed fracture with routine healing: Secondary | ICD-10-CM | POA: Diagnosis not present

## 2021-07-01 DIAGNOSIS — R41841 Cognitive communication deficit: Secondary | ICD-10-CM | POA: Diagnosis not present

## 2021-07-01 DIAGNOSIS — I739 Peripheral vascular disease, unspecified: Secondary | ICD-10-CM | POA: Diagnosis not present

## 2021-07-01 DIAGNOSIS — E46 Unspecified protein-calorie malnutrition: Secondary | ICD-10-CM | POA: Diagnosis not present

## 2021-07-01 DIAGNOSIS — J9621 Acute and chronic respiratory failure with hypoxia: Secondary | ICD-10-CM | POA: Diagnosis not present

## 2021-07-01 DIAGNOSIS — M93971 Osteochondropathy, unspecified, right ankle and foot: Secondary | ICD-10-CM | POA: Diagnosis not present

## 2021-07-01 DIAGNOSIS — Z9181 History of falling: Secondary | ICD-10-CM | POA: Diagnosis not present

## 2021-07-01 DIAGNOSIS — M6250 Muscle wasting and atrophy, not elsewhere classified, unspecified site: Secondary | ICD-10-CM | POA: Diagnosis not present

## 2021-07-05 ENCOUNTER — Other Ambulatory Visit: Payer: Self-pay

## 2021-07-05 ENCOUNTER — Encounter (HOSPITAL_COMMUNITY): Payer: Self-pay

## 2021-07-05 ENCOUNTER — Emergency Department (HOSPITAL_COMMUNITY): Payer: No Typology Code available for payment source

## 2021-07-05 ENCOUNTER — Observation Stay (HOSPITAL_COMMUNITY)
Admission: EM | Admit: 2021-07-05 | Discharge: 2021-07-08 | Disposition: E | Payer: No Typology Code available for payment source | Attending: Family Medicine | Admitting: Family Medicine

## 2021-07-05 DIAGNOSIS — Z7902 Long term (current) use of antithrombotics/antiplatelets: Secondary | ICD-10-CM | POA: Insufficient documentation

## 2021-07-05 DIAGNOSIS — J449 Chronic obstructive pulmonary disease, unspecified: Secondary | ICD-10-CM | POA: Diagnosis not present

## 2021-07-05 DIAGNOSIS — Z8673 Personal history of transient ischemic attack (TIA), and cerebral infarction without residual deficits: Secondary | ICD-10-CM | POA: Insufficient documentation

## 2021-07-05 DIAGNOSIS — R0603 Acute respiratory distress: Secondary | ICD-10-CM | POA: Diagnosis not present

## 2021-07-05 DIAGNOSIS — Z515 Encounter for palliative care: Secondary | ICD-10-CM

## 2021-07-05 DIAGNOSIS — N182 Chronic kidney disease, stage 2 (mild): Secondary | ICD-10-CM | POA: Insufficient documentation

## 2021-07-05 DIAGNOSIS — Z96641 Presence of right artificial hip joint: Secondary | ICD-10-CM | POA: Diagnosis not present

## 2021-07-05 DIAGNOSIS — Z79899 Other long term (current) drug therapy: Secondary | ICD-10-CM | POA: Diagnosis not present

## 2021-07-05 DIAGNOSIS — G935 Compression of brain: Secondary | ICD-10-CM | POA: Diagnosis not present

## 2021-07-05 DIAGNOSIS — I129 Hypertensive chronic kidney disease with stage 1 through stage 4 chronic kidney disease, or unspecified chronic kidney disease: Secondary | ICD-10-CM | POA: Diagnosis not present

## 2021-07-05 DIAGNOSIS — S065XAA Traumatic subdural hemorrhage with loss of consciousness status unknown, initial encounter: Secondary | ICD-10-CM | POA: Diagnosis not present

## 2021-07-05 DIAGNOSIS — Z7982 Long term (current) use of aspirin: Secondary | ICD-10-CM | POA: Diagnosis not present

## 2021-07-05 DIAGNOSIS — I6200 Nontraumatic subdural hemorrhage, unspecified: Secondary | ICD-10-CM | POA: Diagnosis not present

## 2021-07-05 DIAGNOSIS — R4182 Altered mental status, unspecified: Secondary | ICD-10-CM | POA: Diagnosis present

## 2021-07-05 LAB — I-STAT VENOUS BLOOD GAS, ED
Acid-Base Excess: 5 mmol/L — ABNORMAL HIGH (ref 0.0–2.0)
Bicarbonate: 26.9 mmol/L (ref 20.0–28.0)
Calcium, Ion: 1.14 mmol/L — ABNORMAL LOW (ref 1.15–1.40)
HCT: 27 % — ABNORMAL LOW (ref 39.0–52.0)
Hemoglobin: 9.2 g/dL — ABNORMAL LOW (ref 13.0–17.0)
O2 Saturation: 99 %
Potassium: 4 mmol/L (ref 3.5–5.1)
Sodium: 150 mmol/L — ABNORMAL HIGH (ref 135–145)
TCO2: 28 mmol/L (ref 22–32)
pCO2, Ven: 31.1 mmHg — ABNORMAL LOW (ref 44–60)
pH, Ven: 7.546 — ABNORMAL HIGH (ref 7.25–7.43)
pO2, Ven: 119 mmHg — ABNORMAL HIGH (ref 32–45)

## 2021-07-05 LAB — CBC WITH DIFFERENTIAL/PLATELET
Abs Immature Granulocytes: 0.14 10*3/uL — ABNORMAL HIGH (ref 0.00–0.07)
Basophils Absolute: 0.1 10*3/uL (ref 0.0–0.1)
Basophils Relative: 0 %
Eosinophils Absolute: 0.2 10*3/uL (ref 0.0–0.5)
Eosinophils Relative: 1 %
HCT: 30.6 % — ABNORMAL LOW (ref 39.0–52.0)
Hemoglobin: 9.1 g/dL — ABNORMAL LOW (ref 13.0–17.0)
Immature Granulocytes: 1 %
Lymphocytes Relative: 12 %
Lymphs Abs: 2.1 10*3/uL (ref 0.7–4.0)
MCH: 27.3 pg (ref 26.0–34.0)
MCHC: 29.7 g/dL — ABNORMAL LOW (ref 30.0–36.0)
MCV: 91.9 fL (ref 80.0–100.0)
Monocytes Absolute: 1.3 10*3/uL — ABNORMAL HIGH (ref 0.1–1.0)
Monocytes Relative: 7 %
Neutro Abs: 14.7 10*3/uL — ABNORMAL HIGH (ref 1.7–7.7)
Neutrophils Relative %: 79 %
Platelets: 393 10*3/uL (ref 150–400)
RBC: 3.33 MIL/uL — ABNORMAL LOW (ref 4.22–5.81)
RDW: 17.2 % — ABNORMAL HIGH (ref 11.5–15.5)
WBC: 18.4 10*3/uL — ABNORMAL HIGH (ref 4.0–10.5)
nRBC: 0 % (ref 0.0–0.2)

## 2021-07-05 LAB — PROTIME-INR
INR: 1.3 — ABNORMAL HIGH (ref 0.8–1.2)
Prothrombin Time: 16.2 seconds — ABNORMAL HIGH (ref 11.4–15.2)

## 2021-07-05 LAB — COMPREHENSIVE METABOLIC PANEL
ALT: 41 U/L (ref 0–44)
AST: 51 U/L — ABNORMAL HIGH (ref 15–41)
Albumin: 2.7 g/dL — ABNORMAL LOW (ref 3.5–5.0)
Alkaline Phosphatase: 157 U/L — ABNORMAL HIGH (ref 38–126)
Anion gap: 5 (ref 5–15)
BUN: 76 mg/dL — ABNORMAL HIGH (ref 8–23)
CO2: 25 mmol/L (ref 22–32)
Calcium: 8.8 mg/dL — ABNORMAL LOW (ref 8.9–10.3)
Chloride: 120 mmol/L — ABNORMAL HIGH (ref 98–111)
Creatinine, Ser: 1.4 mg/dL — ABNORMAL HIGH (ref 0.61–1.24)
GFR, Estimated: 51 mL/min — ABNORMAL LOW (ref >60)
Glucose, Bld: 133 mg/dL — ABNORMAL HIGH (ref 70–99)
Potassium: 4.2 mmol/L (ref 3.5–5.1)
Sodium: 150 mmol/L — ABNORMAL HIGH (ref 135–145)
Total Bilirubin: 0.9 mg/dL (ref 0.3–1.2)
Total Protein: 7 g/dL (ref 6.5–8.1)

## 2021-07-05 LAB — APTT: aPTT: 37 seconds — ABNORMAL HIGH (ref 24–36)

## 2021-07-05 LAB — TROPONIN I (HIGH SENSITIVITY): Troponin I (High Sensitivity): 24 ng/L — ABNORMAL HIGH (ref <18)

## 2021-07-05 LAB — BRAIN NATRIURETIC PEPTIDE: B Natriuretic Peptide: 106.1 pg/mL — ABNORMAL HIGH (ref 0.0–100.0)

## 2021-07-05 LAB — CBG MONITORING, ED: Glucose-Capillary: 126 mg/dL — ABNORMAL HIGH (ref 70–99)

## 2021-07-05 MED ORDER — SODIUM CHLORIDE 0.9 % IV SOLN: INTRAVENOUS | Status: DC

## 2021-07-05 MED ORDER — MORPHINE SULFATE (PF) 4 MG/ML IV SOLN
INTRAVENOUS | Status: AC
  Filled 2021-07-05: qty 1

## 2021-07-05 MED ORDER — MORPHINE SULFATE (PF) 2 MG/ML IV SOLN: 2.0000 mg | INTRAVENOUS | Status: DC | PRN

## 2021-07-05 MED ORDER — MIDAZOLAM HCL 2 MG/2ML IJ SOLN
INTRAMUSCULAR | Status: AC
  Filled 2021-07-05: qty 2

## 2021-07-05 MED ORDER — BIOTENE DRY MOUTH MT LIQD: 15.0000 mL | OROMUCOSAL | Status: DC | PRN

## 2021-07-05 MED ORDER — SODIUM CHLORIDE 3 % IV SOLN: INTRAVENOUS | Status: DC

## 2021-07-05 MED ORDER — ONDANSETRON 4 MG PO TBDP: 4.0000 mg | ORAL_TABLET | Freq: Four times a day (QID) | ORAL | Status: DC | PRN

## 2021-07-05 MED ORDER — ONDANSETRON HCL 4 MG/2ML IJ SOLN: 4.0000 mg | Freq: Four times a day (QID) | INTRAMUSCULAR | Status: DC | PRN

## 2021-07-05 MED ORDER — FENTANYL CITRATE PF 50 MCG/ML IJ SOSY
PREFILLED_SYRINGE | INTRAMUSCULAR | Status: AC
  Filled 2021-07-05: qty 2

## 2021-07-05 MED ORDER — KETAMINE HCL 50 MG/5ML IJ SOSY
PREFILLED_SYRINGE | INTRAMUSCULAR | Status: AC
  Filled 2021-07-05: qty 5

## 2021-07-05 MED ORDER — GLYCOPYRROLATE 0.2 MG/ML IJ SOLN
0.2000 mg | INTRAMUSCULAR | Status: DC | PRN
Start: 2021-07-05 — End: 2021-07-05

## 2021-07-05 MED ORDER — MORPHINE 100MG IN NS 100ML (1MG/ML) PREMIX INFUSION: 10.0000 mg/h | INTRAVENOUS | Status: DC

## 2021-07-05 MED ORDER — POLYVINYL ALCOHOL 1.4 % OP SOLN
1.0000 [drp] | Freq: Four times a day (QID) | OPHTHALMIC | Status: DC | PRN
  Filled 2021-07-05: qty 15

## 2021-07-05 MED ORDER — SUCCINYLCHOLINE CHLORIDE 200 MG/10ML IV SOSY
PREFILLED_SYRINGE | INTRAVENOUS | Status: AC
  Filled 2021-07-05: qty 10

## 2021-07-05 MED ORDER — SODIUM CHLORIDE 3 % IV SOLN
INTRAVENOUS | Status: DC
  Filled 2021-07-05 (×3): qty 500

## 2021-07-05 MED ORDER — MORPHINE SULFATE (PF) 2 MG/ML IV SOLN: 1.0000 mg | INTRAVENOUS | Status: DC | PRN

## 2021-07-05 MED ORDER — GLYCOPYRROLATE 0.2 MG/ML IJ SOLN
0.4000 mg | INTRAMUSCULAR | Status: DC
  Administered 2021-07-05: 0.4 mg via INTRAVENOUS
  Filled 2021-07-05: qty 2

## 2021-07-05 MED ORDER — HALOPERIDOL LACTATE 5 MG/ML IJ SOLN: 0.5000 mg | INTRAMUSCULAR | Status: DC | PRN

## 2021-07-05 MED ORDER — HALOPERIDOL 0.5 MG PO TABS: 0.5000 mg | ORAL_TABLET | ORAL | Status: DC | PRN

## 2021-07-05 MED ORDER — SODIUM CHLORIDE 23.4 % INJECTION (4 MEQ/ML) FOR IV ADMINISTRATION: 120.0000 meq | Freq: Once | INTRAVENOUS | Status: DC

## 2021-07-05 MED ORDER — SODIUM CHLORIDE 3 % IV BOLUS
250.0000 mL | Freq: Once | INTRAVENOUS | Status: DC
  Filled 2021-07-05: qty 500

## 2021-07-05 MED ORDER — MANNITOL 25 % IV SOLN: 25.0000 g | Freq: Once | INTRAVENOUS | Status: DC

## 2021-07-05 MED ORDER — ETOMIDATE 2 MG/ML IV SOLN
INTRAVENOUS | Status: AC
  Filled 2021-07-05: qty 20

## 2021-07-05 MED ORDER — GLYCOPYRROLATE 1 MG PO TABS
1.0000 mg | ORAL_TABLET | ORAL | Status: DC | PRN
Start: 2021-07-05 — End: 2021-07-05
  Filled 2021-07-05: qty 1

## 2021-07-05 MED ORDER — MORPHINE BOLUS VIA INFUSION: 4.0000 mg | INTRAVENOUS | Status: DC | PRN

## 2021-07-05 MED ORDER — HALOPERIDOL LACTATE 2 MG/ML PO CONC
0.5000 mg | ORAL | Status: DC | PRN
  Filled 2021-07-05: qty 5

## 2021-07-05 MED ORDER — MORPHINE SULFATE (PF) 4 MG/ML IV SOLN
4.0000 mg | INTRAVENOUS | Status: DC | PRN
  Administered 2021-07-05: 4 mg via INTRAVENOUS

## 2021-07-05 MED ORDER — ROCURONIUM BROMIDE 10 MG/ML (PF) SYRINGE
PREFILLED_SYRINGE | INTRAVENOUS | Status: AC
  Filled 2021-07-05: qty 10

## 2021-07-06 ENCOUNTER — Encounter: Payer: No Typology Code available for payment source | Admitting: Physical Medicine and Rehabilitation

## 2021-07-08 NOTE — Consult Note (Signed)
Consultation Note Date: July 12, 2021   Patient Name: William Kirk  DOB: 12-14-1940  MRN: [id number]  Age / Sex: 81 y.o., male  PCP: Floreen Comber, PA-C Referring Physician: Orland Mustard, MD  Reason for Consultation: Terminal Care  HPI/Patient Profile: 81 y.o. male  with past medical history of COPD, dysphagia, hypertension, PAD, depression, anxiety, CKD stage II, and ORIF of periprosthetic right femur fracture on 05/21/2021 admitted on July 12, 2021 with AMS.   Patient with SDH and very poor prognosis, has transitioned to comfort care after discussion with ED provider.  PMT has been consulted to assist with end-of-life care and possible transfer to residential hospice.  Clinical Assessment and Goals of Care:  I have reviewed medical records including EPIC notes, labs and imaging, received report from RN, assessed the patient and then called patient's [name] to discuss diagnosis prognosis, GOC, EOL wishes, disposition and options.  I introduced Palliative Medicine as specialized medical care for people living with serious illness. It focuses on providing relief from the symptoms and stress of a serious illness. The goal is to improve quality of life for both the patient and the family.  We discussed a brief life review of the patient and then focused on their current illness.  The natural disease trajectory and expectations at EOL were discussed.  Medical History Review and Understanding:  Patient's [name] has a good understanding of updates received from the team, poor prognosis and impending death of patient with very little options for treatment.   Palliative Symptoms: Dyspnea, secretions   Discussion: Discussed with patient's [name], who had to leave the bedside as it was very difficult to watch patient in severe respiratory distress and other uncontrolled symptoms including secretions.   She shares patient's declaration for desire for natural death and confirms that patient would desire a comfort focused approach to his care given the unfortunately poor prognosis.  We discussed options for symptom management and disposition for end-of-life care including residential hospice.  William Kirk shares that she just recently lost her brother and it is shocking that that she is in the situation.  She has experience with [facility], as her friend was cared for there with a cancer diagnosis.  She has no preference for whether to admit the patient or defer to pursue residential hospice. both are about the same distance from her for visitation.  She shares that patient's sister and brother-in-law are on their way from [place] and should arrive around 8 PM tonight.  She is agreeable to continue the conversation tomorrow and understands that he may not survive the night.   Questions and concerns were addressed. The family was encouraged to call with questions or concerns.  PMT will continue to support holistically.   SUMMARY OF RECOMMENDATIONS   -DNR -Comfort care with medications per [facility] -Discussed with [facility] and patient will be reviewed tomorrow by hospital liaison -Spiritual care consult for prayer per wife's request -Psychosocial and emotional support provided -PMT will continue to follow  Prognosis:  Hours - Days  Discharge Planning:  Hospital death versus residential hospice       Primary Diagnoses: Present on Admission:  Subdural hematoma (HCC)    Physical Exam Vitals and nursing note reviewed.  Constitutional:      General: He is in acute distress.     Appearance: He is ill-appearing.  Cardiovascular:     Rate and Rhythm: Normal rate.  Pulmonary:     Effort: Respiratory distress present.  Neurological:  Mental Status: He is unresponsive.    Vital Signs: BP (!) 149/65   Pulse 82   Temp (!) 97.1 F (36.2 C) (Axillary)   Resp (!) 28   Ht 6' (1.829 m)   Wt 65.8  kg   SpO2 99%   BMI 19.67 kg/m          SpO2: SpO2: 99 % O2 Device:SpO2: 99 % O2 Flow Rate: .    Palliative Assessment/Data: 10%      Total time: I spent 75 minutes in the care of the patient today in the above activities and documenting the encounter.    Antonella Upson Jeni Salles, PA-C  Palliative Medicine Team Team phone # [phone]  Thank you for allowing the Palliative Medicine Team to assist in the care of this patient. Please utilize secure chat with additional questions, if there is no response within 30 minutes please call the above phone number.  Palliative Medicine Team providers are available by phone from 7am to 7pm daily and can be reached through the team cell phone.  Should this patient require assistance outside of these hours, please call the patient's attending physician.

## 2021-07-08 NOTE — ED Notes (Signed)
Pt tagged and placed in body bag.

## 2021-07-08 NOTE — ED Notes (Signed)
Patient remains unresponsive, tachypnic, secretions beginning to rattle in throat. Provider informed.

## 2021-07-08 NOTE — ED Notes (Signed)
Provider at beside. Provider to call wife and notify of patient's passing.

## 2021-07-08 NOTE — Consult Note (Signed)
Consult Note   Patient: William Kirk AOZ:[id number] DOB: August 12, 1940 DOA: 08/02/21 DOS: the patient was seen and examined on 2021-08-02 PCP: Floreen Comber, PA-C  Patient coming from: SNF -[facility] in rehab    Chief Complaint: AMS  HPI: William Kirk is a 81 y.o. male with medical history significant of  COPD, dysphagia, hypertension, PAD, depression, anxiety, CKD stage II, and ORIF of periprosthetic right femur fracture on 05/21/2021 and recent admit for hip fracture s/p intramedullary nailing on 06/16/21 who was found unresponsive at his SNF. History is from his wife. His wife saw him this morning and knew something wasn't right. He opened his eyes and looked at his wife and isn't sure he  knew she was there. He did put his arms on the dog.  He wasn't talking at all. She told the nurse that something wasn't right as he normally is talkative.  She states he had a bad weekend with some confusion, lethargy and urine/fecal incontinence and then got out the feces and got it all over him.   The nurse checked on him today after his wife left and they called EMS. He was unresponsive on arrival to ED with dilated and non reactive pupil on the left.   ER Course:  vitals: temperature: 97.1, bp: 139/58, HR: 85, RR: 34, oxygen: 100%NRM Pertinent labs: sodium: 150, BUN: 76, creatinine: 1.40, albumin 2.7, AST 51,  WBC: 18.4, hgb: 9.1, troponin 24, bnp: 106, pH: 7.546,  CT head: Large (2.3 cm thick) mixed density acute subdural hemorrhage along the left cerebral convexity with significant (1.6 cm) rightward midline shift. Recommend urgent neurosurgical consultation. 2. Mass effect on the left lateral ventricle with rounding the right temporal horn, concerning for early entrapment. In ED: SW/palliative care consulted. Discussed with family and comfort care only.    Review of Systems: unable to review all systems due to the inability of the patient to answer questions. Past Medical  History:  Diagnosis Date   Anxiety    Arthritis    "hands" (09/21/2016)   Bleeding stomach ulcer 2000s   COPD (chronic obstructive pulmonary disease) (HCC)    Cough    Depression    Dyspnea    Grade I diastolic dysfunction 06/10/2021   Hypertension    Pneumonia    "now and several times before this" (09/21/2016)   PVD (peripheral vascular disease) (HCC) 2014   prev PTCA on RLE   Past Surgical History:  Procedure Laterality Date   ABDOMINAL AORTOGRAM W/LOWER EXTREMITY Bilateral 03/05/2021   Procedure: ABDOMINAL AORTOGRAM W/LOWER EXTREMITY;  Surgeon: Leonie Douglas, MD;  Location: [facility];  Service: Cardiovascular;  Laterality: Bilateral;   HIP ARTHROPLASTY Right 05/21/2021   Procedure: REVISION HIP REPLACEMENT;  Surgeon: [name], MD;  Location: [facility] OR;  Service: Orthopedics;  Laterality: Right;   INTRAMEDULLARY (IM) NAIL INTERTROCHANTERIC Left 06/16/2021   Procedure: INTRAMEDULLARY (IM) NAIL INTERTROCHANTRIC;  Surgeon: [name], MD;  Location: [facility];  Service: Orthopedics;  Laterality: Left;   IR ANGIOGRAM SELECTIVE EACH ADDITIONAL VESSEL  07/26/2018   IR ANGIOGRAM SELECTIVE EACH ADDITIONAL VESSEL  07/26/2018   IR ANGIOGRAM SELECTIVE EACH ADDITIONAL VESSEL  07/26/2018   IR EMBO ART  VEN HEMORR LYMPH EXTRAV  INC GUIDE ROADMAPPING  07/26/2018   IR GASTROSTOMY TUBE MOD SED  12/14/2018   IR GASTROSTOMY TUBE MOD SED  05/28/2021   IR US GUIDE VASC ACCESS RIGHT  07/26/2018   JOINT REPLACEMENT     KNEE ARTHROSCOPY  Right X 1   PERIPHERAL VASCULAR BALLOON ANGIOPLASTY  03/05/2021   Procedure: PERIPHERAL VASCULAR BALLOON ANGIOPLASTY;  Surgeon: Leonie Douglas, MD;  Location: [facility];  Service: Cardiovascular;;  [facility] Popliteal   PERIPHERAL VASCULAR INTERVENTION  03/05/2021   Procedure: PERIPHERAL VASCULAR INTERVENTION;  Surgeon: Leonie Douglas, MD;  Location: [facility];  Service: Cardiovascular;;   TOTAL HIP ARTHROPLASTY Right 2009   TRANSCAROTID  ARTERY REVASCULARIZATION  Right 02/24/2021   Procedure: RIGHT TRANSCAROTID ARTERY REVASCULARIZATION;  Surgeon: Leonie Douglas, MD;  Location: [facility] OR;  Service: Vascular;  Laterality: Right;   ULTRASOUND GUIDANCE FOR VASCULAR ACCESS Left 02/24/2021   Procedure: ULTRASOUND GUIDANCE FOR VASCULAR ACCESS;  Surgeon: Leonie Douglas, MD;  Location[facility] OR;  Service: Vascular;  Laterality: Left;   VIDEO ASSISTED THORACOSCOPY (VATS)/ LOBECTOMY Right 10/18/2018   Procedure: VIDEO ASSISTED THORACOSCOPY (VATS)/ right lower LOBECTOMY;  Surgeon: Loreli Slot, MD;  Location: [facility] OR;  Service: Thoracic;  Laterality: Right;   VIDEO ASSISTED THORACOSCOPY (VATS)/DECORTICATION Right 11/09/2018   Procedure: REDO VIDEO ASSISTED THORACOSCOPY (VATS)/DECORTICATION/DRAIN EFFUSION;  Surgeon: Loreli Slot, MD;  Location: [facility] OR;  Service: Thoracic;  Laterality: Right;   VIDEO BRONCHOSCOPY N/A 07/27/2018   Procedure: VIDEO BRONCHOSCOPY WITHOUT FLUORO;  Surgeon: Leslye Peer, MD;  Location: [facility] OR;  Service: Cardiopulmonary;  Laterality: N/A;   VIDEO BRONCHOSCOPY N/A 11/09/2018   Procedure: VIDEO BRONCHOSCOPY;  Surgeon: Loreli Slot, MD;  Location: [facility] OR;  Service: Thoracic;  Laterality: N/A;   Social History:  reports that he quit smoking about 6 years ago. His smoking use included cigarettes. He has a 55.00 pack-year smoking history. He has never used smokeless tobacco. He reports that he does not drink alcohol and does not use drugs.  Allergies  Allergen Reactions   Testosterone Rash    Family History  Problem Relation Age of Onset   Cancer Father 46       Brain   Dementia Mother     Prior to Admission medications   Medication Sig Start Date End Date Taking? Authorizing Provider  albuterol (PROVENTIL) (2.5 MG/3ML) 0.083% nebulizer solution Take 3 mLs (2.5 mg total) by nebulization every 6 (six) hours as needed for wheezing or shortness of breath. 05/07/21  Yes Parrett, Tammy S, NP  albuterol  (VENTOLIN HFA) 108 (90 Base) MCG/ACT inhaler Inhale 2 puffs into the lungs every 4 (four) hours as needed for wheezing or shortness of breath. 07/11/18  Yes Hongalgi, Maximino Greenland, MD  ARTIFICIAL TEARS 1.4 % ophthalmic solution Place 1 drop into both eyes daily as needed for dry eyes.   Yes [provider]  aspirin 81 MG chewable tablet Place 81 mg into feeding tube daily.   Yes [provider]  atorvastatin (LIPITOR) 80 MG tablet Take 1 tablet (80 mg total) by mouth at bedtime. Patient taking differently: Place 80 mg into feeding tube at bedtime. 02/25/21  Yes Lars Mage, PA-C  Cholecalciferol (VITAMIN D3) 25 MCG (1000 UT) CHEW Place 2,000 Units into feeding tube at bedtime.   Yes [provider]  clopidogrel (PLAVIX) 75 MG tablet Take 1 tablet (75 mg total) by mouth daily. Patient taking differently: Place 75 mg into feeding tube daily. 01/12/21  Yes Leonie Douglas, MD  diclofenac sodium (VOLTAREN) 1 % GEL Apply 2 g topically 4 (four) times daily as needed (to painful sites).   Yes [provider]  enoxaparin (LOVENOX) 40 MG/0.4ML injection Inject 0.4 mLs (40 mg  total) into the skin daily for 26 days. 06/21/21 07/17/21 Yes Almon Hercules, MD  ferrous sulfate 220 (44 Fe) MG/5ML solution Place 220 mg into feeding tube daily.   Yes [provider]  fluticasone-salmeterol (WIXELA INHUB) 250-50 MCG/ACT AEPB Inhale 1 puff into the lungs in the morning and at bedtime. 05/07/21  Yes Parrett, Tammy S, NP  lidocaine 4 % Place 1 patch onto the skin daily as needed (for pain- remove at bedtime).   Yes [provider]  LORazepam (ATIVAN) 0.5 MG tablet Place 0.5 mg into feeding tube as needed for anxiety. 07/01/21 07/15/21 Yes [provider]  melatonin 3 MG TABS tablet Take 1 tablet (3 mg total) by mouth at bedtime. Patient taking differently: Place 3 mg into feeding tube at bedtime. 06/01/21  Yes Dahal, Melina Schools, MD  metoprolol tartrate (LOPRESSOR) 25 MG  tablet Place 12.5 mg into feeding tube daily.   Yes [provider]  midodrine (PROAMATINE) 2.5 MG tablet Place 2.5 mg into feeding tube 2 (two) times daily as needed (SBP < 95).   Yes [provider]  Multiple Vitamin (MULTIVITAMIN PO) Give 1 tablet by tube See admin instructions. Multivitamin + Folic Acid 0.4 mg- One tablet, per tube, every morning   Yes [provider]  nutrition supplement, JUVEN, (JUVEN) PACK Place 1 packet into feeding tube 2 (two) times daily between meals. 06/01/21  Yes Dahal, Melina Schools, MD  Nutritional Supplements (FEEDING SUPPLEMENT, OSMOLITE 1.5 CAL,) LIQD Place 1,000 mLs into feeding tube continuous. Patient taking differently: Place 237 mLs into feeding tube every 4 (four) hours. 06/01/21  Yes Dahal, Melina Schools, MD  Nutritional Supplements (FEEDING SUPPLEMENT, PROSOURCE TF,) liquid Place 45 mLs into feeding tube 2 (two) times daily. 06/01/21  Yes Dahal, Melina Schools, MD  pantoprazole (PROTONIX) 40 MG tablet Take 1 tablet (40 mg total) by mouth daily. Patient taking differently: 40 mg daily. 40 mg, per tube, every morning 06/01/21  Yes Dahal, Melina Schools, MD  pantoprazole sodium (PROTONIX) 40 mg Place 40 mg into feeding tube at bedtime.   Yes [provider]  polyethylene glycol (MIRALAX / GLYCOLAX) 17 g packet Place 17 g into feeding tube 2 (two) times daily. Patient taking differently: Place 17 g into feeding tube at bedtime. 06/21/21  Yes Almon Hercules, MD  STERILE WATER PO Place 100 mLs into feeding tube See admin instructions. 100 ml's, per tube, before AND after bolus feeding   Yes [provider]  zinc sulfate 220 (50 Zn) MG capsule Place 1 capsule (220 mg total) into feeding tube daily. Patient taking differently: Place 220 mg into feeding tube at bedtime. 06/21/21  Yes Almon Hercules, MD  ascorbic acid (VITAMIN C) 250 MG tablet Place 1 tablet (250 mg total) into feeding tube 2 (two) times daily. 06/21/21   Almon Hercules, MD   guaiFENesin-dextromethorphan (ROBITUSSIN DM) 100-10 MG/5ML syrup Place 5 mLs into feeding tube every 12 (twelve) hours as needed for cough.    [provider]  loperamide HCl (IMODIUM) 1 MG/7.5ML suspension Take 2 mg by mouth every 6 (six) hours as needed for diarrhea or loose stools.    [provider]  methocarbamol (ROBAXIN) 500 MG tablet Take 1 tablet (500 mg total) by mouth 2 (two) times daily as needed for muscle spasms. Patient taking differently: Place 500 mg into feeding tube See admin instructions. 250 mg every morning (hold for sedation), 500 mg BID PRN muscle spasms, 500 mg at bedtime 04/16/20   Marcello Fennel,  MD  QUEtiapine (SEROQUEL) 50 MG tablet Place 1 tablet (50 mg total) into feeding tube 2 (two) times daily. 06/01/21   Dahal, Melina Schools, MD  senna-docusate (SENOKOT-S) 8.6-50 MG tablet Place 1 tablet into feeding tube 2 (two) times daily. 06/21/21   Almon Hercules, MD  sertraline (ZOLOFT) 100 MG tablet Place 2 tablets (200 mg total) into feeding tube daily. 11/23/18   Sharlene Dory, PA-C  tiotropium (SPIRIVA) 18 MCG inhalation capsule Place 36 mcg into inhaler and inhale daily.    [provider]    Physical Exam: Vitals:   2021/07/24 1500 2021-07-24 1530 24-Jul-2021 1630 07-24-21 1648  BP: (!) 150/59 (!) 144/64 (!) 149/65   Pulse: 82 87 82   Resp: (!) 32 (!) 29 (!) 28   Temp:      TempSrc:      SpO2: 100% 100% 99%   Weight:    65.8 kg  Height:    6' (1.829 m)   General:  Appears calm and comfortable. Agonal breathing with wet oral secretions.  Eyes:  Pupils dilated, non reactive.  ENT:  atraumatic  Neck:  no LAD, masses or thyromegaly; no carotid bruits Cardiovascular:  tachycardia , no m/r/g. No LE edema.  Respiratory:   respiratory distress. Agonal breathing with wet oral secretions.  Abdomen:  soft, NT, ND. PEG tube in place. Clean/no drainage  Back:   normal alignment, no CVAT Skin:  no rash or induration seen on limited exam Musculoskeletal:   non responsive. Stiff/rigid in extremities  Lower extremity:  No LE edema.  Limited foot exam with no ulcerations. Psychiatric:  cnt  Neurologic:  pupils dilated, non responsive. He is non responsive.    Radiological Exams on Admission: Independently reviewed - see discussion in A/P where applicable  CT HEAD WO CONTRAST  Result Date: 07-24-21 CLINICAL DATA:  Mental status change, unknown cause EXAM: CT HEAD WITHOUT CONTRAST TECHNIQUE: Contiguous axial images were obtained from the base of the skull through the vertex without intravenous contrast. RADIATION DOSE REDUCTION: This exam was performed according to the departmental dose-optimization program which includes automated exposure control, adjustment of the mA and/or kV according to patient size and/or use of iterative reconstruction technique. COMPARISON:  CT head May 10 23. FINDINGS: Brain: Large (2.3 cm thick) mixed density acute subdural hemorrhage along the left cerebral convexity. Resulting mass effect with significant (1.6 cm) rightward midline shift and effacement of the right lateral ventricle with rounding of the right temporal horn. Subdural hemorrhage tracks along the falx and left tentorial leaflet. No evidence of acute large vascular territory infarct. Vascular: No hyperdense vessel identified. Skull: No acute fracture. Sinuses/Orbits: Clear sinuses.  No acute orbital findings. Other: No mastoid effusions. IMPRESSION: 1. Large (2.3 cm thick) mixed density acute subdural hemorrhage along the left cerebral convexity with significant (1.6 cm) rightward midline shift. Recommend urgent neurosurgical consultation. 2. Mass effect on the left lateral ventricle with rounding the right temporal horn, concerning for early entrapment. Findings discussed with Dr. Marval Regal via telephone at 2:15 PM. Electronically Signed   By: Feliberto Harts M.D.   On: 07/24/2021 14:18   DG Chest Port 1 View  Result Date: 07-24-2021 CLINICAL DATA:  Patient  found unresponsive.  History of COPD. EXAM: PORTABLE CHEST 1 VIEW COMPARISON:  06/16/2021 and older exams. FINDINGS: There is volume loss on the right with opacity at the right lung base obscuring the right hemidiaphragm, unchanged from the prior study. Stable surgical anastomosis staples at the right lung base.  Left lung is hyperexpanded with prominent bronchovascular markings, but otherwise clear, and also unchanged. Cardiac silhouette is normal in size. No mediastinal or hilar masses. Small right effusion suspected. No left pleural effusion. No pneumothorax. Skeletal structures are grossly intact. IMPRESSION: 1. No acute cardiopulmonary disease. 2. Chronic right lung findings and postsurgical changes, stable from the prior exam. Electronically Signed   By: Amie [place] M.D.   On: 07/29/2021 14:29    EKG: Independently reviewed.  NSR with rate 86; nonspecific ST changes with no evidence of acute ischemia   Labs on Admission: I have personally reviewed the available labs and imaging studies at the time of the admission.     Assessment and Plan: Principal Problem:   Subdural hematoma with significant rightward midline shift and mass effecton left lateral ventricle/end of life care Active Problems:   End of life care    Assessment and Plan: * Subdural hematoma with significant rightward midline shift and mass effecton left lateral ventricle/end of life care 81 year old male presenting for unresponsiveness at his SNF found to have acute SDH with significant rightward midline shift and mass effect on the left lateral ventricle with rounding of the right temporal horn, concerning for early entrapment -patient is a DNR and after discussion with family, full comfort care in place. There are no beds at [facility] and palliative care recommended admission to hospital -obs to palliative floor as death is imminent -palliative care consulted and following  -comfort care order set utilized -robinul to  help oral secretions -morphine PRN until pump available; however, he does not appear to be in pain  -patient passed away in ED, family notified     Advance Care Planning:   Code Status: DNR   Consults: palliative care   DVT Prophylaxis: comfort care   Family Communication: discussed with wife over phone: [name] Ruffins [phone]  Severity of Illness: The appropriate patient status for this patient is OBSERVATION. Observation status is judged to be reasonable and necessary in order to provide the required intensity of service to ensure the patient's safety. The patient's presenting symptoms, physical exam findings, and initial radiographic and laboratory data in the context of their medical condition is felt to place them at decreased risk for further clinical deterioration. Furthermore, it is anticipated that the patient will be medically stable for discharge from the hospital within 2 midnights of admission.   Author: Orland Mustard, MD 07/29/21 6:54 PM  For on call review [email or web address].

## 2021-07-08 NOTE — ED Notes (Signed)
Spoke to Jory Sims with medical examiner. Pt will be placed on hold for an ME case.

## 2021-07-08 NOTE — ED Notes (Signed)
Spoke to [employer] from [employer]. Pt being placed on hold for tissue donation.

## 2021-07-08 NOTE — ED Notes (Signed)
Lab results given to Dr. Kathrynn Humble

## 2021-07-08 NOTE — ED Notes (Signed)
Help get patient undressed on the monitor patient is resting with nurse at bedside

## 2021-07-08 NOTE — ED Provider Notes (Signed)
Patient signed out by Dr Kathrynn Humble that with ICH/SDH and that after discussions with family, all in agreement  that patient wishes would be for comfort care (transiently changed to full code for purposes of recent hip surgery, but dnr/dni both prior to and since surgery), and that admission pending.   Palliative care team consulted - discussed pt - they will see, they indicate no [facility]/outpatient hospice beds immediately available, and rec admit to medicine.   Hospitalists consulted for admission.     Lajean Saver, MD 07/13/21 1757

## 2021-07-08 NOTE — Progress Notes (Signed)
   Referral received for William Kirk :end of life and discussed with Dr. Ashok Cordia. Chart reviewed including progress notes, labs, imaging, current comfort care medications, and updates received from RN. Patient assessed and is unable to engage appropriately in discussions. He is in respiratory distress with copious oropharyngeal secretions. No family present during my visit.  Discussed with [facility] representative covering hospital liaison service line and then with [facility] facility regarding possible transfer to [facility] for end of life care versus admission to the hospital. I was informed that the patient will be reviewed by RN and likely will need to be seen by hospital liaison in the morning, as it is currently after hours.    Attempted to contact patient's wife [name]. Unable to reach. Voicemail left with contact information given.   Adjusted orders for PRN Robinul to be scheduled for excessive secretions. Patient currently has morphine gtt orders per MD and will hopefully have some improvement of symptoms when this is administered.    PMT will re-attempt to contact family at a later time/date. Detailed note and recommendations to follow once GOC has been completed.    Thank you for your referral and allowing PMT to assist in Mr. William Kirk's care.     Total time: I spent 35 minutes in the care of the patient today in the above activities and documenting the encounter.   Dorthy Cooler, PA-C Palliative Medicine Team Team phone # [phone]  Thank you for allowing the Palliative Medicine Team to assist in the care of this patient. Please utilize secure chat with additional questions, if there is no response within 30 minutes please call the above phone number.  Palliative Medicine Team providers are available by phone from 7am to 7pm daily and can be reached through the team cell phone.  Should this patient require assistance outside of these hours, please call  the patient's attending physician.

## 2021-07-08 NOTE — ED Triage Notes (Signed)
Pt arrives via EMS from [facility] with reports of LSN at 1130 when they went to feed him through his PEG tube. Pt was found unresponsive. Pt has unequal pupils.

## 2021-07-08 NOTE — ED Notes (Addendum)
Time of death called at [place] by this RN Bartholomew Boards and verified by second RN Maggie.

## 2021-07-08 NOTE — ED Provider Notes (Addendum)
bleed, even with neurosurgery intervention.  I spoke with patient's wife.  She indicates that patient has a new form that she has with her.  She calls that form " declaration of desire for natural death".  Without going into the details  of that documentation, I asked wife, who is next to kin if patient had wishes for heroic measures for resuscitation or surgical purposes.  She did not think so.  I informed her that I suspect that her husband will likely pass away from this bleed or be permanently disabled, she will be at the bedside in 30 minutes.  I will still continue with hypertonic saline that was ordered but not intubate the patient. [AN]    Clinical Course User Index [AN] Varney Biles, MD                           Medical Decision Making Amount and/or Complexity of Data Reviewed Labs: ordered. Radiology: ordered. Decision-making details documented in ED Course.  Risk OTC drugs. Prescription drug management.   This patient presents to the ED with chief complaint(s) of altered mental status with pertinent past medical history of recent hip replacement, chronic medical conditions, stroke and PEG tube which further complicates the presenting complaint. The complaint involves an extensive differential diagnosis and also carries with it a high risk of complications and morbidity.    Patient noted to be acutely altered.  He has unequal pupils.  He is in respiratory distress and tachypneic.  No hypoxia, but he is on nonrebreather.  He is also noted to be withdrawing to pain over all 4 extremities, but not following commands.   The differential diagnosis includes   Acute stroke, intracranial hemorrhage, sepsis, pulmonary edema, pleural effusion, PE.  The initial plan is to get stat CT scan of the brain along with basic blood work-up.    Additional history obtained: Additional history obtained from spouse and EMS  Records reviewed previous admission documents  Independent labs interpretation:  The following labs were independently interpreted: Respiratory alkalosis, elevated white count significant uremia.  Independent visualization of imaging: - I independently visualized the following imaging with scope of  interpretation limited to determining acute life threatening conditions related to emergency care: CT scan of the brain, which revealed massive subdural hematoma with midline shift.  Treatment and Reassessment: Patient initiated on hypertonic saline.  No new seizures.  No difference in posturing.  Consultation: - Consulted or discussed management/test interpretation w/ external professional: Neurosurgery, however patient's goals of care clarification occurred after the consultation.  Neurosurgery on standby in case patient's family changes mind.  Social work has been consulted to see if they can assist with hospice placement.  Otherwise patient will need admission to the hospital.  End of care order set implemented.  Nursing and incoming team made aware.  Discussed case with wife on 2 separate occasions prior to starting end of care order set.  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[facility])    Rx / DC Orders ED Discharge Orders     None         Varney Biles, MD July 09, 2021 [place], Carolyn Maniscalco, MD 09-Jul-2021 1725  120 (*)    Glucose, Bld 133 (*)    BUN 76 (*)    Creatinine, Ser 1.40 (*)    Calcium 8.8 (*)    Albumin 2.7 (*)    AST 51 (*)    Alkaline Phosphatase 157 (*)    GFR, Estimated 51 (*)    All other components within normal limits  CBC WITH DIFFERENTIAL/PLATELET - Abnormal; Notable for the following components:   WBC 18.4 (*)    RBC 3.33 (*)    Hemoglobin 9.1 (*)    HCT 30.6 (*)    MCHC 29.7 (*)    RDW 17.2 (*)    Neutro Abs 14.7 (*)    Monocytes Absolute 1.3 (*)    Abs Immature Granulocytes 0.14 (*)    All other components within normal limits  PROTIME-INR - Abnormal; Notable for the following components:   Prothrombin Time 16.2 (*)    INR 1.3 (*)    All other components within normal limits  APTT - Abnormal; Notable for the following components:   aPTT 37 (*)    All other components within normal limits  BRAIN NATRIURETIC PEPTIDE - Abnormal; Notable for the following components:   B Natriuretic Peptide 106.1 (*)    All other components within normal limits  CBG MONITORING, ED - Abnormal; Notable for the following components:   Glucose-Capillary 126 (*)    All other components within normal limits  I-STAT VENOUS BLOOD GAS, ED - Abnormal; Notable for the following components:   pH, Ven 7.546 (*)    pCO2, Ven 31.1 (*)    pO2, Ven 119 (*)    Acid-Base Excess 5.0 (*)    Sodium 150 (*)    Calcium, Ion 1.14 (*)     HCT 27.0 (*)    Hemoglobin 9.2 (*)    All other components within normal limits  TROPONIN I (HIGH SENSITIVITY) - Abnormal; Notable for the following components:   Troponin I (High Sensitivity) 24 (*)    All other components within normal limits  CBG MONITORING, ED    EKG EKG Interpretation  Date/Time:  Jul 06, 2021 13:32:45 EDT Ventricular Rate:  86 PR Interval:  105 QRS Duration: 98 QT Interval:  405 QTC Calculation: 485 R Axis:   73 Text Interpretation: Sinus rhythm Short PR interval Borderline prolonged QT interval Confirmed by Lajean Saver [phone]) on 07-06-2021 4:41:49 PM  Radiology CT HEAD WO CONTRAST  Result Date: 2021-07-06 CLINICAL DATA:  Mental status change, unknown cause EXAM: CT HEAD WITHOUT CONTRAST TECHNIQUE: Contiguous axial images were obtained from the base of the skull through the vertex without intravenous contrast. RADIATION DOSE REDUCTION: This exam was performed according to the departmental dose-optimization program which includes automated exposure control, adjustment of the mA and/or kV according to patient size and/or use of iterative reconstruction technique. COMPARISON:  CT head May 10 23. FINDINGS: Brain: Large (2.3 cm thick) mixed density acute subdural hemorrhage along the left cerebral convexity. Resulting mass effect with significant (1.6 cm) rightward midline shift and effacement of the right lateral ventricle with rounding of the right temporal horn. Subdural hemorrhage tracks along the falx and left tentorial leaflet. No evidence of acute large vascular territory infarct. Vascular: No hyperdense vessel identified. Skull: No acute fracture. Sinuses/Orbits: Clear sinuses.  No acute orbital findings. Other: No mastoid effusions. IMPRESSION: 1. Large (2.3 cm thick) mixed density acute subdural hemorrhage along the left cerebral convexity with significant (1.6 cm) rightward midline shift. Recommend urgent neurosurgical consultation. 2.  bleed, even with neurosurgery intervention.  I spoke with patient's wife.  She indicates that patient has a new form that she has with her.  She calls that form " declaration of desire for natural death".  Without going into the details  of that documentation, I asked wife, who is next to kin if patient had wishes for heroic measures for resuscitation or surgical purposes.  She did not think so.  I informed her that I suspect that her husband will likely pass away from this bleed or be permanently disabled, she will be at the bedside in 30 minutes.  I will still continue with hypertonic saline that was ordered but not intubate the patient. [AN]    Clinical Course User Index [AN] Varney Biles, MD                           Medical Decision Making Amount and/or Complexity of Data Reviewed Labs: ordered. Radiology: ordered. Decision-making details documented in ED Course.  Risk OTC drugs. Prescription drug management.   This patient presents to the ED with chief complaint(s) of altered mental status with pertinent past medical history of recent hip replacement, chronic medical conditions, stroke and PEG tube which further complicates the presenting complaint. The complaint involves an extensive differential diagnosis and also carries with it a high risk of complications and morbidity.    Patient noted to be acutely altered.  He has unequal pupils.  He is in respiratory distress and tachypneic.  No hypoxia, but he is on nonrebreather.  He is also noted to be withdrawing to pain over all 4 extremities, but not following commands.   The differential diagnosis includes   Acute stroke, intracranial hemorrhage, sepsis, pulmonary edema, pleural effusion, PE.  The initial plan is to get stat CT scan of the brain along with basic blood work-up.    Additional history obtained: Additional history obtained from spouse and EMS  Records reviewed previous admission documents  Independent labs interpretation:  The following labs were independently interpreted: Respiratory alkalosis, elevated white count significant uremia.  Independent visualization of imaging: - I independently visualized the following imaging with scope of  interpretation limited to determining acute life threatening conditions related to emergency care: CT scan of the brain, which revealed massive subdural hematoma with midline shift.  Treatment and Reassessment: Patient initiated on hypertonic saline.  No new seizures.  No difference in posturing.  Consultation: - Consulted or discussed management/test interpretation w/ external professional: Neurosurgery, however patient's goals of care clarification occurred after the consultation.  Neurosurgery on standby in case patient's family changes mind.  Social work has been consulted to see if they can assist with hospice placement.  Otherwise patient will need admission to the hospital.  End of care order set implemented.  Nursing and incoming team made aware.  Discussed case with wife on 2 separate occasions prior to starting end of care order set.  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  I spoke with patient's wife.  She indicates that patient has a new form that she has with her.  She calls that form " declaration of desire for natural death".  Without going into the details  of that documentation, I asked wife, who is next to kin if patient had wishes for heroic measures for resuscitation or surgical purposes.  She did not think so.  I informed her that I suspect that her husband will likely pass away from this bleed or be permanently disabled, she will be at the bedside in 30 minutes.  I will still continue with hypertonic saline that was ordered but not intubate the patient. [AN]    Clinical Course User Index [AN] Varney Biles, MD                           Medical Decision Making Amount and/or Complexity of Data Reviewed Labs: ordered. Radiology: ordered. Decision-making details documented in ED Course.  Risk OTC drugs. Prescription drug management.   This patient presents to the ED with chief complaint(s) of altered mental status with pertinent past medical history of recent hip replacement, chronic medical conditions, stroke and PEG tube which further complicates the presenting complaint. The complaint involves an extensive differential diagnosis and also carries with it a high risk of complications and morbidity.    Patient noted to be acutely altered.  He has unequal pupils.  He is in respiratory distress and tachypneic.  No hypoxia, but he is on nonrebreather.  He is also noted to be withdrawing to pain over all 4 extremities, but not following commands.   The differential diagnosis includes   Acute stroke, intracranial hemorrhage, sepsis, pulmonary edema, pleural effusion, PE.  The initial plan is to get stat CT scan of the brain along with basic blood work-up.    Additional history obtained: Additional history obtained from spouse and EMS  Records reviewed previous admission documents  Independent labs interpretation:  The following labs were independently interpreted: Respiratory alkalosis, elevated white count significant uremia.  Independent visualization of imaging: - I independently visualized the following imaging with scope of  interpretation limited to determining acute life threatening conditions related to emergency care: CT scan of the brain, which revealed massive subdural hematoma with midline shift.  Treatment and Reassessment: Patient initiated on hypertonic saline.  No new seizures.  No difference in posturing.  Consultation: - Consulted or discussed management/test interpretation w/ external professional: Neurosurgery, however patient's goals of care clarification occurred after the consultation.  Neurosurgery on standby in case patient's family changes mind.  Social work has been consulted to see if they can assist with hospice placement.  Otherwise patient will need admission to the hospital.  End of care order set implemented.  Nursing and incoming team made aware.  Discussed case with wife on 2 separate occasions prior to starting end of care order set.  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[facility])    Rx / DC Orders ED Discharge Orders     None         Varney Biles, MD July 09, 2021 [place], Carolyn Maniscalco, MD 09-Jul-2021 1725  120 (*)    Glucose, Bld 133 (*)    BUN 76 (*)    Creatinine, Ser 1.40 (*)    Calcium 8.8 (*)    Albumin 2.7 (*)    AST 51 (*)    Alkaline Phosphatase 157 (*)    GFR, Estimated 51 (*)    All other components within normal limits  CBC WITH DIFFERENTIAL/PLATELET - Abnormal; Notable for the following components:   WBC 18.4 (*)    RBC 3.33 (*)    Hemoglobin 9.1 (*)    HCT 30.6 (*)    MCHC 29.7 (*)    RDW 17.2 (*)    Neutro Abs 14.7 (*)    Monocytes Absolute 1.3 (*)    Abs Immature Granulocytes 0.14 (*)    All other components within normal limits  PROTIME-INR - Abnormal; Notable for the following components:   Prothrombin Time 16.2 (*)    INR 1.3 (*)    All other components within normal limits  APTT - Abnormal; Notable for the following components:   aPTT 37 (*)    All other components within normal limits  BRAIN NATRIURETIC PEPTIDE - Abnormal; Notable for the following components:   B Natriuretic Peptide 106.1 (*)    All other components within normal limits  CBG MONITORING, ED - Abnormal; Notable for the following components:   Glucose-Capillary 126 (*)    All other components within normal limits  I-STAT VENOUS BLOOD GAS, ED - Abnormal; Notable for the following components:   pH, Ven 7.546 (*)    pCO2, Ven 31.1 (*)    pO2, Ven 119 (*)    Acid-Base Excess 5.0 (*)    Sodium 150 (*)    Calcium, Ion 1.14 (*)     HCT 27.0 (*)    Hemoglobin 9.2 (*)    All other components within normal limits  TROPONIN I (HIGH SENSITIVITY) - Abnormal; Notable for the following components:   Troponin I (High Sensitivity) 24 (*)    All other components within normal limits  CBG MONITORING, ED    EKG EKG Interpretation  Date/Time:  Jul 06, 2021 13:32:45 EDT Ventricular Rate:  86 PR Interval:  105 QRS Duration: 98 QT Interval:  405 QTC Calculation: 485 R Axis:   73 Text Interpretation: Sinus rhythm Short PR interval Borderline prolonged QT interval Confirmed by Lajean Saver [phone]) on 07-06-2021 4:41:49 PM  Radiology CT HEAD WO CONTRAST  Result Date: 2021-07-06 CLINICAL DATA:  Mental status change, unknown cause EXAM: CT HEAD WITHOUT CONTRAST TECHNIQUE: Contiguous axial images were obtained from the base of the skull through the vertex without intravenous contrast. RADIATION DOSE REDUCTION: This exam was performed according to the departmental dose-optimization program which includes automated exposure control, adjustment of the mA and/or kV according to patient size and/or use of iterative reconstruction technique. COMPARISON:  CT head May 10 23. FINDINGS: Brain: Large (2.3 cm thick) mixed density acute subdural hemorrhage along the left cerebral convexity. Resulting mass effect with significant (1.6 cm) rightward midline shift and effacement of the right lateral ventricle with rounding of the right temporal horn. Subdural hemorrhage tracks along the falx and left tentorial leaflet. No evidence of acute large vascular territory infarct. Vascular: No hyperdense vessel identified. Skull: No acute fracture. Sinuses/Orbits: Clear sinuses.  No acute orbital findings. Other: No mastoid effusions. IMPRESSION: 1. Large (2.3 cm thick) mixed density acute subdural hemorrhage along the left cerebral convexity with significant (1.6 cm) rightward midline shift. Recommend urgent neurosurgical consultation. 2.

## 2021-07-08 NOTE — Progress Notes (Signed)
Chaplain met pt's wife, sister, and brother in law in Consult Room and brought them to room 36 to say their goodbyes.  Wife and sister are deacons, and brother in law was pastor so chaplain needed to say little.  Chaplain received contact information from widow.  She wishes to use [facility] home on [address]   Her phone is  [phone]  Cell [phone]  Chaplain offered support and presence and gave her a Patient Placement Card.  Rev. Tamsen Snider Pager [phone]

## 2021-07-08 NOTE — ED Notes (Signed)
Wife at bedside. Stated her husband wanted a natural death and had filled out papers many years ago. She stated she could not stay long, because she could not bare to see him in this shape. Patient remains unresponsive with pupils fixed.

## 2021-07-08 NOTE — Assessment & Plan Note (Addendum)
81 year old male presenting for unresponsiveness at his SNF found to have acute SDH with significant rightward midline shift and mass effect on the left lateral ventricle with rounding of the right temporal horn, concerning for early entrapment -patient is a DNR and after discussion with family, full comfort care in place. There are no beds at [facility] and palliative care recommended admission to hospital -obs to palliative floor as death is imminent -palliative care consulted and following  -comfort care order set utilized -robinul to help oral secretions -morphine PRN until pump available; however, he does not appear to be in pain  -patient passed away in ED, family notified

## 2021-07-08 DEATH — deceased

## 2021-07-17 ENCOUNTER — Other Ambulatory Visit: Payer: Self-pay | Admitting: Vascular Surgery

## 2021-07-17 DIAGNOSIS — I6523 Occlusion and stenosis of bilateral carotid arteries: Secondary | ICD-10-CM

## 2021-07-28 ENCOUNTER — Ambulatory Visit: Payer: No Typology Code available for payment source | Admitting: Pulmonary Disease

## 2021-11-15 ENCOUNTER — Ambulatory Visit: Payer: No Typology Code available for payment source | Admitting: Physical Medicine and Rehabilitation

## 2022-03-08 NOTE — Progress Notes (Signed)
Received faxed coding query from Tanna Furry @ [facility] on 02/10/22 for Dr. Stanford Breed.  Provider signed, form faxed back to sender, verified successful, sent to scan center.

## 2024-02-07 ENCOUNTER — Other Ambulatory Visit (HOSPITAL_BASED_OUTPATIENT_CLINIC_OR_DEPARTMENT_OTHER): Payer: Self-pay
